# Patient Record
Sex: Female | Born: 1965 | Race: White | Hispanic: No | Marital: Married | Smoking: Former smoker
Health system: Southern US, Community
[De-identification: ages and names within clinical notes are randomized; demographics above are authoritative.]

## PROBLEM LIST (undated history)

## (undated) DIAGNOSIS — O1490 Unspecified pre-eclampsia, unspecified trimester: Secondary | ICD-10-CM

## (undated) DIAGNOSIS — J45909 Unspecified asthma, uncomplicated: Secondary | ICD-10-CM

## (undated) DIAGNOSIS — E1169 Type 2 diabetes mellitus with other specified complication: Secondary | ICD-10-CM

## (undated) DIAGNOSIS — D649 Anemia, unspecified: Secondary | ICD-10-CM

## (undated) DIAGNOSIS — F329 Major depressive disorder, single episode, unspecified: Secondary | ICD-10-CM

## (undated) DIAGNOSIS — I10 Essential (primary) hypertension: Secondary | ICD-10-CM

## (undated) DIAGNOSIS — I749 Embolism and thrombosis of unspecified artery: Secondary | ICD-10-CM

## (undated) DIAGNOSIS — F419 Anxiety disorder, unspecified: Secondary | ICD-10-CM

## (undated) DIAGNOSIS — Z87442 Personal history of urinary calculi: Secondary | ICD-10-CM

## (undated) DIAGNOSIS — G4730 Sleep apnea, unspecified: Secondary | ICD-10-CM

## (undated) DIAGNOSIS — R0600 Dyspnea, unspecified: Secondary | ICD-10-CM

## (undated) DIAGNOSIS — F32A Depression, unspecified: Secondary | ICD-10-CM

## (undated) DIAGNOSIS — E669 Obesity, unspecified: Secondary | ICD-10-CM

## (undated) HISTORY — PX: KIDNEY STONE SURGERY: SHX686

## (undated) HISTORY — DX: Unspecified pre-eclampsia, unspecified trimester: O14.90

---

## 1997-04-13 ENCOUNTER — Encounter (HOSPITAL_COMMUNITY): Admission: RE | Admit: 1997-04-13 | Discharge: 1997-04-26 | Payer: Self-pay | Admitting: *Deleted

## 1997-04-13 ENCOUNTER — Ambulatory Visit (HOSPITAL_COMMUNITY): Admission: RE | Admit: 1997-04-13 | Discharge: 1997-04-13 | Payer: Self-pay | Admitting: *Deleted

## 1997-04-27 ENCOUNTER — Inpatient Hospital Stay (HOSPITAL_COMMUNITY): Admission: AD | Admit: 1997-04-27 | Discharge: 1997-04-30 | Payer: Self-pay | Admitting: Obstetrics & Gynecology

## 1997-05-04 ENCOUNTER — Inpatient Hospital Stay (HOSPITAL_COMMUNITY): Admission: AD | Admit: 1997-05-04 | Discharge: 1997-05-04 | Payer: Self-pay | Admitting: *Deleted

## 1997-06-01 ENCOUNTER — Inpatient Hospital Stay (HOSPITAL_COMMUNITY): Admission: EM | Admit: 1997-06-01 | Discharge: 1997-06-01 | Payer: Self-pay | Admitting: *Deleted

## 1999-01-31 ENCOUNTER — Encounter (HOSPITAL_COMMUNITY): Admission: RE | Admit: 1999-01-31 | Discharge: 1999-05-01 | Payer: Self-pay | Admitting: *Deleted

## 1999-02-16 ENCOUNTER — Other Ambulatory Visit: Admission: RE | Admit: 1999-02-16 | Discharge: 1999-02-16 | Payer: Self-pay | Admitting: General Practice

## 1999-03-10 ENCOUNTER — Other Ambulatory Visit: Admission: RE | Admit: 1999-03-10 | Discharge: 1999-03-10 | Payer: Self-pay | Admitting: General Practice

## 1999-07-04 ENCOUNTER — Other Ambulatory Visit: Admission: RE | Admit: 1999-07-04 | Discharge: 1999-07-04 | Payer: Self-pay | Admitting: Gynecology

## 1999-09-05 ENCOUNTER — Encounter (HOSPITAL_COMMUNITY): Admission: RE | Admit: 1999-09-05 | Discharge: 1999-12-04 | Payer: Self-pay | Admitting: *Deleted

## 1999-10-04 ENCOUNTER — Inpatient Hospital Stay (HOSPITAL_COMMUNITY): Admission: AD | Admit: 1999-10-04 | Discharge: 1999-10-04 | Payer: Self-pay | Admitting: *Deleted

## 1999-11-01 ENCOUNTER — Inpatient Hospital Stay (HOSPITAL_COMMUNITY): Admission: AD | Admit: 1999-11-01 | Discharge: 1999-11-01 | Payer: Self-pay | Admitting: Obstetrics

## 1999-12-12 ENCOUNTER — Encounter: Payer: Self-pay | Admitting: *Deleted

## 1999-12-12 ENCOUNTER — Encounter (HOSPITAL_COMMUNITY): Admission: RE | Admit: 1999-12-12 | Discharge: 2000-03-11 | Payer: Self-pay | Admitting: *Deleted

## 1999-12-19 ENCOUNTER — Encounter: Payer: Self-pay | Admitting: *Deleted

## 2000-01-15 ENCOUNTER — Inpatient Hospital Stay (HOSPITAL_COMMUNITY): Admission: AD | Admit: 2000-01-15 | Discharge: 2000-01-15 | Payer: Self-pay | Admitting: Obstetrics

## 2000-02-16 ENCOUNTER — Encounter: Payer: Self-pay | Admitting: *Deleted

## 2000-03-15 ENCOUNTER — Encounter: Payer: Self-pay | Admitting: *Deleted

## 2000-03-15 ENCOUNTER — Encounter (HOSPITAL_COMMUNITY): Admission: RE | Admit: 2000-03-15 | Discharge: 2000-04-24 | Payer: Self-pay | Admitting: *Deleted

## 2000-04-05 ENCOUNTER — Ambulatory Visit (HOSPITAL_COMMUNITY): Admission: RE | Admit: 2000-04-05 | Discharge: 2000-04-05 | Payer: Self-pay | Admitting: *Deleted

## 2000-04-08 ENCOUNTER — Encounter: Payer: Self-pay | Admitting: Obstetrics & Gynecology

## 2000-04-08 ENCOUNTER — Inpatient Hospital Stay (HOSPITAL_COMMUNITY): Admission: AD | Admit: 2000-04-08 | Discharge: 2000-04-10 | Payer: Self-pay | Admitting: Obstetrics & Gynecology

## 2000-04-12 ENCOUNTER — Encounter: Payer: Self-pay | Admitting: *Deleted

## 2000-04-20 ENCOUNTER — Inpatient Hospital Stay (HOSPITAL_COMMUNITY): Admission: AD | Admit: 2000-04-20 | Discharge: 2000-04-20 | Payer: Self-pay | Admitting: *Deleted

## 2000-04-22 ENCOUNTER — Inpatient Hospital Stay (HOSPITAL_COMMUNITY): Admission: AD | Admit: 2000-04-22 | Discharge: 2000-04-22 | Payer: Self-pay | Admitting: Obstetrics

## 2000-04-23 ENCOUNTER — Inpatient Hospital Stay (HOSPITAL_COMMUNITY): Admission: AD | Admit: 2000-04-23 | Discharge: 2000-04-25 | Payer: Self-pay | Admitting: *Deleted

## 2000-04-28 ENCOUNTER — Inpatient Hospital Stay (HOSPITAL_COMMUNITY): Admission: AD | Admit: 2000-04-28 | Discharge: 2000-04-28 | Payer: Self-pay | Admitting: Obstetrics & Gynecology

## 2000-05-09 ENCOUNTER — Ambulatory Visit (HOSPITAL_COMMUNITY): Admission: RE | Admit: 2000-05-09 | Discharge: 2000-05-09 | Payer: Self-pay | Admitting: Oncology

## 2000-05-09 ENCOUNTER — Encounter: Payer: Self-pay | Admitting: Oncology

## 2000-06-03 ENCOUNTER — Inpatient Hospital Stay (HOSPITAL_COMMUNITY): Admission: AD | Admit: 2000-06-03 | Discharge: 2000-06-03 | Payer: Self-pay | Admitting: Obstetrics & Gynecology

## 2000-07-04 ENCOUNTER — Emergency Department (HOSPITAL_COMMUNITY): Admission: EM | Admit: 2000-07-04 | Discharge: 2000-07-04 | Payer: Self-pay | Admitting: Emergency Medicine

## 2001-10-21 ENCOUNTER — Encounter (HOSPITAL_COMMUNITY): Admission: RE | Admit: 2001-10-21 | Discharge: 2001-11-20 | Payer: Self-pay | Admitting: *Deleted

## 2001-11-04 ENCOUNTER — Encounter: Payer: Self-pay | Admitting: *Deleted

## 2001-11-23 ENCOUNTER — Observation Stay (HOSPITAL_COMMUNITY): Admission: AD | Admit: 2001-11-23 | Discharge: 2001-11-24 | Payer: Self-pay | Admitting: *Deleted

## 2001-11-26 ENCOUNTER — Inpatient Hospital Stay (HOSPITAL_COMMUNITY): Admission: AD | Admit: 2001-11-26 | Discharge: 2001-11-27 | Payer: Self-pay | Admitting: *Deleted

## 2001-12-02 ENCOUNTER — Encounter (HOSPITAL_COMMUNITY): Admission: RE | Admit: 2001-12-02 | Discharge: 2002-01-01 | Payer: Self-pay | Admitting: *Deleted

## 2002-01-06 ENCOUNTER — Encounter (HOSPITAL_COMMUNITY): Admission: RE | Admit: 2002-01-06 | Discharge: 2002-02-05 | Payer: Self-pay | Admitting: *Deleted

## 2002-01-06 ENCOUNTER — Encounter: Payer: Self-pay | Admitting: *Deleted

## 2002-01-12 ENCOUNTER — Inpatient Hospital Stay (HOSPITAL_COMMUNITY): Admission: AD | Admit: 2002-01-12 | Discharge: 2002-01-12 | Payer: Self-pay | Admitting: *Deleted

## 2002-01-16 ENCOUNTER — Inpatient Hospital Stay (HOSPITAL_COMMUNITY): Admission: AD | Admit: 2002-01-16 | Discharge: 2002-01-16 | Payer: Self-pay | Admitting: *Deleted

## 2002-02-10 ENCOUNTER — Encounter (HOSPITAL_COMMUNITY): Admission: RE | Admit: 2002-02-10 | Discharge: 2002-03-12 | Payer: Self-pay | Admitting: *Deleted

## 2002-03-17 ENCOUNTER — Encounter (HOSPITAL_COMMUNITY): Admission: RE | Admit: 2002-03-17 | Discharge: 2002-03-17 | Payer: Self-pay | Admitting: *Deleted

## 2002-03-17 ENCOUNTER — Encounter: Payer: Self-pay | Admitting: *Deleted

## 2002-03-30 ENCOUNTER — Inpatient Hospital Stay (HOSPITAL_COMMUNITY): Admission: AD | Admit: 2002-03-30 | Discharge: 2002-03-30 | Payer: Self-pay | Admitting: *Deleted

## 2002-03-31 ENCOUNTER — Encounter (HOSPITAL_COMMUNITY): Admission: RE | Admit: 2002-03-31 | Discharge: 2002-04-30 | Payer: Self-pay | Admitting: *Deleted

## 2002-04-08 ENCOUNTER — Encounter: Payer: Self-pay | Admitting: *Deleted

## 2002-04-08 ENCOUNTER — Encounter: Admission: RE | Admit: 2002-04-08 | Discharge: 2002-04-08 | Payer: Self-pay | Admitting: *Deleted

## 2002-04-08 ENCOUNTER — Observation Stay (HOSPITAL_COMMUNITY): Admission: AD | Admit: 2002-04-08 | Discharge: 2002-04-09 | Payer: Self-pay | Admitting: Obstetrics and Gynecology

## 2002-04-16 ENCOUNTER — Encounter: Admission: RE | Admit: 2002-04-16 | Discharge: 2002-04-16 | Payer: Self-pay | Admitting: *Deleted

## 2002-04-23 ENCOUNTER — Encounter: Admission: RE | Admit: 2002-04-23 | Discharge: 2002-04-23 | Payer: Self-pay | Admitting: *Deleted

## 2002-04-30 ENCOUNTER — Encounter: Admission: RE | Admit: 2002-04-30 | Discharge: 2002-04-30 | Payer: Self-pay | Admitting: *Deleted

## 2002-04-30 ENCOUNTER — Ambulatory Visit (HOSPITAL_COMMUNITY): Admission: RE | Admit: 2002-04-30 | Discharge: 2002-04-30 | Payer: Self-pay | Admitting: *Deleted

## 2002-05-07 ENCOUNTER — Encounter (HOSPITAL_COMMUNITY): Admission: RE | Admit: 2002-05-07 | Discharge: 2002-05-31 | Payer: Self-pay | Admitting: *Deleted

## 2002-05-07 ENCOUNTER — Encounter: Admission: RE | Admit: 2002-05-07 | Discharge: 2002-05-07 | Payer: Self-pay | Admitting: *Deleted

## 2002-05-11 ENCOUNTER — Encounter: Admission: RE | Admit: 2002-05-11 | Discharge: 2002-05-11 | Payer: Self-pay | Admitting: *Deleted

## 2002-05-14 ENCOUNTER — Encounter: Admission: RE | Admit: 2002-05-14 | Discharge: 2002-05-14 | Payer: Self-pay | Admitting: *Deleted

## 2002-05-15 ENCOUNTER — Inpatient Hospital Stay (HOSPITAL_COMMUNITY): Admission: AD | Admit: 2002-05-15 | Discharge: 2002-05-15 | Payer: Self-pay | Admitting: Family Medicine

## 2002-06-01 ENCOUNTER — Inpatient Hospital Stay (HOSPITAL_COMMUNITY): Admission: AD | Admit: 2002-06-01 | Discharge: 2002-06-04 | Payer: Self-pay | Admitting: *Deleted

## 2002-06-12 ENCOUNTER — Encounter: Admission: RE | Admit: 2002-06-12 | Discharge: 2002-06-12 | Payer: Self-pay | Admitting: Family Medicine

## 2002-06-25 ENCOUNTER — Encounter: Admission: RE | Admit: 2002-06-25 | Discharge: 2002-06-25 | Payer: Self-pay | Admitting: Family Medicine

## 2002-07-14 ENCOUNTER — Other Ambulatory Visit: Admission: RE | Admit: 2002-07-14 | Discharge: 2002-07-14 | Payer: Self-pay | Admitting: *Deleted

## 2002-07-14 ENCOUNTER — Encounter: Admission: RE | Admit: 2002-07-14 | Discharge: 2002-07-14 | Payer: Self-pay | Admitting: *Deleted

## 2002-07-27 ENCOUNTER — Encounter: Admission: RE | Admit: 2002-07-27 | Discharge: 2002-07-27 | Payer: Self-pay | Admitting: *Deleted

## 2004-02-27 ENCOUNTER — Ambulatory Visit: Payer: Self-pay | Admitting: Psychiatry

## 2004-02-27 ENCOUNTER — Inpatient Hospital Stay (HOSPITAL_COMMUNITY): Admission: EM | Admit: 2004-02-27 | Discharge: 2004-03-03 | Payer: Self-pay | Admitting: Psychiatry

## 2004-03-07 ENCOUNTER — Other Ambulatory Visit (HOSPITAL_COMMUNITY): Admission: RE | Admit: 2004-03-07 | Discharge: 2004-06-05 | Payer: Self-pay | Admitting: Psychiatry

## 2011-04-22 ENCOUNTER — Encounter: Payer: Self-pay | Admitting: Family Medicine

## 2011-04-22 ENCOUNTER — Ambulatory Visit (INDEPENDENT_AMBULATORY_CARE_PROVIDER_SITE_OTHER): Payer: BC Managed Care – PPO | Admitting: Family Medicine

## 2011-04-22 VITALS — BP 123/87 | HR 116 | Temp 98.3°F | Resp 18 | Ht 68.0 in | Wt 217.0 lb

## 2011-04-22 DIAGNOSIS — J019 Acute sinusitis, unspecified: Secondary | ICD-10-CM

## 2011-04-22 DIAGNOSIS — J329 Chronic sinusitis, unspecified: Secondary | ICD-10-CM

## 2011-04-22 MED ORDER — AMOXICILLIN-POT CLAVULANATE 875-125 MG PO TABS: 1.0000 | ORAL_TABLET | Freq: Two times a day (BID) | ORAL | Status: AC

## 2011-04-22 NOTE — Patient Instructions (Signed)

## 2011-04-22 NOTE — Progress Notes (Signed)
This is a 46 year old mother of 3 who developed acute sinus pressure, chills, sweats, and nasal congestion in the last 2 days.  She has a history of sinus infections they typically start with this congestion and progressed to pulmonary symptoms. She had a history of pneumonia in the past.  Review of systems is negative for sore throat cough fever nausea urinary symptoms or rash.  Objective: Middle-age woman in no acute distress, appears to be in good spirits  HEENT: Negative with the exception of marked nasal congestion and mucopurulent discharge.  Neck: No adenopathy, supple, no thyromegaly.  Chest: No respiratory distress.  Assessment: Uncomplicated acute sinusitis  Plan: Continue the Flonase, and Augmentin 875 twice a day x10 days.

## 2011-06-19 ENCOUNTER — Ambulatory Visit (INDEPENDENT_AMBULATORY_CARE_PROVIDER_SITE_OTHER): Payer: BC Managed Care – PPO | Admitting: Family Medicine

## 2011-06-19 VITALS — BP 128/87 | HR 94 | Temp 98.3°F | Resp 18 | Ht 68.5 in | Wt 216.0 lb

## 2011-06-19 DIAGNOSIS — B9789 Other viral agents as the cause of diseases classified elsewhere: Secondary | ICD-10-CM

## 2011-06-19 DIAGNOSIS — R197 Diarrhea, unspecified: Secondary | ICD-10-CM

## 2011-06-19 DIAGNOSIS — R110 Nausea: Secondary | ICD-10-CM

## 2011-06-19 DIAGNOSIS — B349 Viral infection, unspecified: Secondary | ICD-10-CM

## 2011-06-19 DIAGNOSIS — R109 Unspecified abdominal pain: Secondary | ICD-10-CM

## 2011-06-19 LAB — POCT CBC
Granulocyte percent: 43.2 %G (ref 37–80)
HCT, POC: 38.3 % (ref 37.7–47.9)
Hemoglobin: 12.7 g/dL (ref 12.2–16.2)
Lymph, poc: 3 (ref 0.6–3.4)
MCH, POC: 29.8 pg (ref 27–31.2)
MCHC: 33.2 g/dL (ref 31.8–35.4)
MCV: 89.9 fL (ref 80–97)
MID (cbc): 0.7 (ref 0–0.9)
MPV: 9.2 fL (ref 0–99.8)
POC Granulocyte: 2.9 (ref 2–6.9)
POC LYMPH PERCENT: 45.8 %L (ref 10–50)
POC MID %: 11 %M (ref 0–12)
Platelet Count, POC: 278 10*3/uL (ref 142–424)
RBC: 4.26 M/uL (ref 4.04–5.48)
RDW, POC: 15.2 %
WBC: 6.6 10*3/uL (ref 4.6–10.2)

## 2011-06-19 LAB — POCT URINALYSIS DIPSTICK
Glucose, UA: NEGATIVE
Ketones, UA: 15
Leukocytes, UA: NEGATIVE
Nitrite, UA: NEGATIVE
Protein, UA: 30
Spec Grav, UA: >=1.03
Urobilinogen, UA: 0.2
pH, UA: 6

## 2011-06-19 LAB — POCT UA - MICROSCOPIC ONLY
Casts, Ur, LPF, POC: NEGATIVE
Yeast, UA: NEGATIVE

## 2011-06-19 MED ORDER — DICYCLOMINE HCL 10 MG PO CAPS: 10.0000 mg | ORAL_CAPSULE | Freq: Three times a day (TID) | ORAL | Status: DC

## 2011-06-19 NOTE — Patient Instructions (Signed)
Fluids.  Return if not improving in 3 days

## 2011-06-19 NOTE — Progress Notes (Signed)
Subjective: Patient has had about 8 days of a pill is. She went to [facility] to see [facility] game developed abdominal pains and diarrhea. He went to an urgent care and got some Lomotil and Zofran. She has persisted in having diarrhea and abdominal discomfort all week. It is a generalized at times crampy discomfort. No major bloating. She does not eat much, but has been drinking a fair amount. She has decreased urination. 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 No one else at home has been ill. She has not been drinking alcohol. Does not know of any trigger that caused the illness. Has never had any surgical abdominal treatment. Her menstrual period has been on for a few days. She is on several psychotropic medications, one of which was just changed a couple of weeks ago. She is only been on the Cymbalta for less than 2 weeks. She is now on 60 mg a day. It has helped her depression considerably. Never had any gallbladder issues. She is not on contraceptives, but her husband has had his vasectomy.  Objective: Overweight white female no acute distress this time. TMs are normal. Throat clear. Neck supple without nodes. Chest clear to auscultation. Heart regular without murmurs. Soft and mass tenderness. Bowel sounds were present. Skin warm and dry.  Assessment: Diarrhea Abdominal pain Depression  Plan: Check CBC, CMET., and uA  Results for orders placed in visit on 06/19/11  POCT CBC      Component Value Range   WBC 6.6  4.6 - 10.2 (K/uL)   Lymph, poc 3.0  0.6 - 3.4    POC LYMPH PERCENT 45.8  10 - 50 (%L)   MID (cbc) 0.7  0 - 0.9    POC MID % 11.0  0 - 12 (%M)   POC Granulocyte 2.9  2 - 6.9    Granulocyte percent 43.2  37 - 80 (%G)   RBC 4.26  4.04 - 5.48 (M/uL)   Hemoglobin 12.7  12.2 - 16.2 (g/dL)   HCT, POC 16.1  09.6 - 47.9 (%)   MCV 89.9  80 - 97 (fL)   MCH, POC 29.8  27 - 31.2 (pg)   MCHC 33.2  31.8 - 35.4 (g/dL)   RDW, POC 04.5     Platelet Count, POC 278  142 - 424  (K/uL)   MPV 9.2  0 - 99.8 (fL)  POCT UA - MICROSCOPIC ONLY      Component Value Range   WBC, Ur, HPF, POC 1-3     RBC, urine, microscopic 8-12     Bacteria, U Microscopic sm     Mucus, UA sm     Epithelial cells, urine per micros 2-4     Crystals, Ur, HPF, POC calcium oxalate     Casts, Ur, LPF, POC neg     Yeast, UA neg    POCT URINALYSIS DIPSTICK      Component Value Range   Color, UA yellow     Clarity, UA hazy     Glucose, UA neg     Bilirubin, UA small     Ketones, UA 15     Spec Grav, UA >=1.030     Blood, UA large     pH, UA 6.0     Protein, UA 30     Urobilinogen, UA 0.2     Nitrite, UA neg     Leukocytes, UA Negative     Will try some Bentyl and the patient.  If she is not doing better by Thursday she is to contact me and we will order a gallbladder ultrasound. Blood chemistries should be back by then.

## 2011-06-20 ENCOUNTER — Telehealth: Payer: Self-pay | Admitting: Family Medicine

## 2011-06-20 LAB — COMPREHENSIVE METABOLIC PANEL
ALT: 21 U/L (ref 0–35)
AST: 13 U/L (ref 0–37)
Albumin: 4.4 g/dL (ref 3.5–5.2)
Alkaline Phosphatase: 59 U/L (ref 39–117)
BUN: 11 mg/dL (ref 6–23)
CO2: 29 mEq/L (ref 19–32)
Calcium: 9.1 mg/dL (ref 8.4–10.5)
Chloride: 103 mEq/L (ref 96–112)
Creat: 0.58 mg/dL (ref 0.50–1.10)
Glucose, Bld: 90 mg/dL (ref 70–99)
Potassium: 3.1 mEq/L — ABNORMAL LOW (ref 3.5–5.3)
Sodium: 142 mEq/L (ref 135–145)
Total Bilirubin: 0.3 mg/dL (ref 0.3–1.2)
Total Protein: 6.2 g/dL (ref 6.0–8.3)

## 2011-06-20 NOTE — Telephone Encounter (Signed)
Called patient and left message on machine. Her potassium level is a little bit low from the persistent gastroenteritis. She is to call back if she is not doing better. If she is really feeling bad she is coming here for recheck probably could just put her on a little potassium, 20 mEq twice a day for 5 days.

## 2011-07-17 ENCOUNTER — Ambulatory Visit (INDEPENDENT_AMBULATORY_CARE_PROVIDER_SITE_OTHER): Payer: BC Managed Care – PPO | Admitting: Internal Medicine

## 2011-07-17 VITALS — BP 128/87 | HR 108 | Temp 98.7°F | Resp 18 | Ht 68.5 in | Wt 224.0 lb

## 2011-07-17 DIAGNOSIS — N390 Urinary tract infection, site not specified: Secondary | ICD-10-CM

## 2011-07-17 DIAGNOSIS — R300 Dysuria: Secondary | ICD-10-CM

## 2011-07-17 LAB — POCT URINALYSIS DIPSTICK
Bilirubin, UA: NEGATIVE
Glucose, UA: 100
Ketones, UA: NEGATIVE
Leukocytes, UA: NEGATIVE
Nitrite, UA: POSITIVE
Protein, UA: 30
Spec Grav, UA: >=1.03
Urobilinogen, UA: 1
pH, UA: 5.5

## 2011-07-17 LAB — POCT UA - MICROSCOPIC ONLY
Bacteria, U Microscopic: NEGATIVE
Casts, Ur, LPF, POC: NEGATIVE
Crystals, Ur, HPF, POC: NEGATIVE
Mucus, UA: NEGATIVE
Yeast, UA: NEGATIVE

## 2011-07-17 MED ORDER — CIPROFLOXACIN HCL 500 MG PO TABS: 500.0000 mg | ORAL_TABLET | Freq: Two times a day (BID) | ORAL | Status: DC

## 2011-07-17 MED ORDER — PHENAZOPYRIDINE HCL 200 MG PO TABS: 200.0000 mg | ORAL_TABLET | Freq: Three times a day (TID) | ORAL | Status: AC | PRN

## 2011-07-17 NOTE — Progress Notes (Signed)
  Subjective:    Patient ID: Deborah Soto, female    DOB: [birth year], 46 y.o.   MRN: [id number]  HPIAbrupt onset of dysuria frequency and urgency last night. Dysuria not responding to over-the-counter medications so she was unable to sleep No fever, sweats, abdominal pain, or back pain Last UTI several years ago    Review of Systems     Objective:   Physical Exam Negative CVA tenderness Negative abdominal tenderness       Results for orders placed in visit on 07/17/11  POCT UA - MICROSCOPIC ONLY      Component Value Range   WBC, Ur, HPF, POC 6-8     RBC, urine, microscopic TNTC     Bacteria, U Microscopic negative     Mucus, UA negative     Epithelial cells, urine per micros 0-2     Crystals, Ur, HPF, POC negative     Casts, Ur, LPF, POC negative     Yeast, UA negative    POCT URINALYSIS DIPSTICK      Component Value Range   Color, UA orange     Clarity, UA cloudy     Glucose, UA 100     Bilirubin, UA neg     Ketones, UA neg     Spec Grav, UA >=1.030     Blood, UA moderate     pH, UA 5.5     Protein, UA 30     Urobilinogen, UA 1.0     Nitrite, UA positive     Leukocytes, UA Negative      Assessment & Plan:  Problem #1 urinary tract infection Cipro 500 twice a day for 5 days Pyridium 200 3 times a day as needed #10 Culture Followup if not responding

## 2011-07-20 LAB — URINE CULTURE: Colony Count: >=100000

## 2011-07-23 ENCOUNTER — Encounter: Payer: Self-pay | Admitting: Internal Medicine

## 2011-07-25 ENCOUNTER — Other Ambulatory Visit: Payer: Self-pay | Admitting: Internal Medicine

## 2011-07-25 DIAGNOSIS — Z1231 Encounter for screening mammogram for malignant neoplasm of breast: Secondary | ICD-10-CM

## 2011-08-09 ENCOUNTER — Ambulatory Visit
Admission: RE | Admit: 2011-08-09 | Discharge: 2011-08-09 | Disposition: A | Discharge status: Home / Self Care | Payer: BC Managed Care – PPO | Source: Ambulatory Visit | Attending: Internal Medicine | Admitting: Internal Medicine

## 2011-08-09 DIAGNOSIS — Z1231 Encounter for screening mammogram for malignant neoplasm of breast: Secondary | ICD-10-CM

## 2011-08-27 ENCOUNTER — Emergency Department (HOSPITAL_COMMUNITY)
Admission: EM | Admit: 2011-08-27 | Discharge: 2011-08-27 | Disposition: A | Discharge status: Home / Self Care | Payer: BC Managed Care – PPO | Attending: Emergency Medicine | Admitting: Emergency Medicine

## 2011-08-27 ENCOUNTER — Emergency Department (HOSPITAL_COMMUNITY): Payer: BC Managed Care – PPO

## 2011-08-27 ENCOUNTER — Encounter (HOSPITAL_COMMUNITY): Payer: Self-pay | Admitting: Emergency Medicine

## 2011-08-27 DIAGNOSIS — R079 Chest pain, unspecified: Secondary | ICD-10-CM | POA: Insufficient documentation

## 2011-08-27 DIAGNOSIS — Z86718 Personal history of other venous thrombosis and embolism: Secondary | ICD-10-CM | POA: Insufficient documentation

## 2011-08-27 DIAGNOSIS — F3289 Other specified depressive episodes: Secondary | ICD-10-CM | POA: Insufficient documentation

## 2011-08-27 DIAGNOSIS — Z79899 Other long term (current) drug therapy: Secondary | ICD-10-CM | POA: Insufficient documentation

## 2011-08-27 DIAGNOSIS — Z86711 Personal history of pulmonary embolism: Secondary | ICD-10-CM | POA: Insufficient documentation

## 2011-08-27 DIAGNOSIS — F329 Major depressive disorder, single episode, unspecified: Secondary | ICD-10-CM | POA: Insufficient documentation

## 2011-08-27 DIAGNOSIS — Z87891 Personal history of nicotine dependence: Secondary | ICD-10-CM | POA: Insufficient documentation

## 2011-08-27 HISTORY — DX: Major depressive disorder, single episode, unspecified: F32.9

## 2011-08-27 HISTORY — DX: Depression, unspecified: F32.A

## 2011-08-27 HISTORY — DX: Embolism and thrombosis of unspecified artery: I74.9

## 2011-08-27 LAB — BASIC METABOLIC PANEL
BUN: 11 mg/dL (ref 6–23)
CO2: 25 mEq/L (ref 19–32)
Calcium: 9.4 mg/dL (ref 8.4–10.5)
Chloride: 102 mEq/L (ref 96–112)
Creatinine, Ser: 0.64 mg/dL (ref 0.50–1.10)
GFR calc Af Amer: >90 mL/min (ref >90)
GFR calc non Af Amer: >90 mL/min (ref >90)
Glucose, Bld: 124 mg/dL — ABNORMAL HIGH (ref 70–99)
Potassium: 3.8 mEq/L (ref 3.5–5.1)
Sodium: 137 mEq/L (ref 135–145)

## 2011-08-27 LAB — DIFFERENTIAL
Basophils Absolute: 0 10*3/uL (ref 0.0–0.1)
Basophils Relative: 0 % (ref 0–1)
Eosinophils Absolute: 0.2 10*3/uL (ref 0.0–0.7)
Eosinophils Relative: 2 % (ref 0–5)
Lymphocytes Relative: 23 % (ref 12–46)
Lymphs Abs: 2.4 10*3/uL (ref 0.7–4.0)
Monocytes Absolute: 1.1 10*3/uL — ABNORMAL HIGH (ref 0.1–1.0)
Monocytes Relative: 11 % (ref 3–12)
Neutro Abs: 6.8 10*3/uL (ref 1.7–7.7)
Neutrophils Relative %: 65 % (ref 43–77)

## 2011-08-27 LAB — CBC
HCT: 40.7 % (ref 36.0–46.0)
Hemoglobin: 13.7 g/dL (ref 12.0–15.0)
MCH: 30.4 pg (ref 26.0–34.0)
MCHC: 33.7 g/dL (ref 30.0–36.0)
MCV: 90.4 fL (ref 78.0–100.0)
Platelets: 279 K/uL (ref 150–400)
RBC: 4.5 MIL/uL (ref 3.87–5.11)
RDW: 13.5 % (ref 11.5–15.5)
WBC: 10.8 K/uL — ABNORMAL HIGH (ref 4.0–10.5)

## 2011-08-27 LAB — COMPREHENSIVE METABOLIC PANEL WITH GFR
ALT: 11 U/L (ref 0–35)
AST: 9 U/L (ref 0–37)
Albumin: 3.9 g/dL (ref 3.5–5.2)
Alkaline Phosphatase: 53 U/L (ref 39–117)
BUN: 11 mg/dL (ref 6–23)
CO2: 25 meq/L (ref 19–32)
Calcium: 9.6 mg/dL (ref 8.4–10.5)
Chloride: 101 meq/L (ref 96–112)
Creatinine, Ser: 0.56 mg/dL (ref 0.50–1.10)
GFR calc Af Amer: >90 mL/min (ref >90)
GFR calc non Af Amer: >90 mL/min (ref >90)
Glucose, Bld: 115 mg/dL — ABNORMAL HIGH (ref 70–99)
Potassium: 4 meq/L (ref 3.5–5.1)
Sodium: 136 meq/L (ref 135–145)
Total Bilirubin: 0.4 mg/dL (ref 0.3–1.2)
Total Protein: 6.5 g/dL (ref 6.0–8.3)

## 2011-08-27 LAB — POCT I-STAT TROPONIN I: Troponin i, poc: 0 ng/mL (ref 0.00–0.08)

## 2011-08-27 LAB — D-DIMER, QUANTITATIVE: D-Dimer, Quant: 0.35 ug{FEU}/mL (ref 0.00–0.48)

## 2011-08-27 LAB — LIPASE, BLOOD: Lipase: 29 U/L (ref 11–59)

## 2011-08-27 MED ORDER — ONDANSETRON HCL 4 MG/2ML IJ SOLN
4.0000 mg | Freq: Once | INTRAMUSCULAR | Status: AC
  Administered 2011-08-27: 4 mg via INTRAVENOUS
  Filled 2011-08-27: qty 2

## 2011-08-27 MED ORDER — ASPIRIN 325 MG PO TABS
325.0000 mg | ORAL_TABLET | ORAL | Status: AC
  Administered 2011-08-27: 325 mg via ORAL

## 2011-08-27 MED ORDER — PANTOPRAZOLE SODIUM 20 MG PO TBEC: 20.0000 mg | DELAYED_RELEASE_TABLET | Freq: Every day | ORAL | Status: DC

## 2011-08-27 MED ORDER — PANTOPRAZOLE SODIUM 40 MG IV SOLR
40.0000 mg | Freq: Once | INTRAVENOUS | Status: AC
  Administered 2011-08-27: 40 mg via INTRAVENOUS
  Filled 2011-08-27: qty 40

## 2011-08-27 MED ORDER — MORPHINE SULFATE 4 MG/ML IJ SOLN
4.0000 mg | Freq: Once | INTRAMUSCULAR | Status: DC
  Filled 2011-08-27: qty 1

## 2011-08-27 MED ORDER — ASPIRIN 325 MG PO TABS
ORAL_TABLET | ORAL | Status: AC
  Filled 2011-08-27: qty 1

## 2011-08-27 MED ORDER — GI COCKTAIL ~~LOC~~
30.0000 mL | Freq: Once | ORAL | Status: AC
  Administered 2011-08-27: 30 mL via ORAL
  Filled 2011-08-27: qty 30

## 2011-08-27 MED ORDER — NITROGLYCERIN 0.4 MG SL SUBL: 0.4000 mg | SUBLINGUAL_TABLET | SUBLINGUAL | Status: DC | PRN

## 2011-08-27 NOTE — ED Provider Notes (Signed)
History     CSN: [id number]  Arrival date & time 08/27/11  7829   First MD Initiated Contact with Patient 08/27/11 0703      Chief Complaint  Patient presents with  . Chest Pain    (Consider location/radiation/quality/duration/timing/severity/associated sxs/prior treatment) Patient is a 46 y.o. female presenting with chest pain. The history is provided by the patient.  Chest Pain Pertinent negatives for primary symptoms include no fever, no shortness of breath, no cough, no palpitations, no abdominal pain, no nausea and no vomiting.    the patient is a 46 year old, morbidly obese, female, with a remote history of pulmonary embolism, who presents to emergency department complaining of right-sided chest pain, which woke her up.  This morning.  She states that the pain is constant and radiates to her back.  The pain does not increase with respirations.  She denies nausea, vomiting, fevers, chills, cough.  She denies leg pain or swelling.  She denies recent travel or surgery.  She occasionally drinks alcohol.  She denies a history of peptic ulcer disease, or reflux.  She denies a history of abdominal surgery.  In the past.  She has a family history of coronary artery disease.  Her father had multiple heart attacks, and strokes, the earliest being when he was in his 68s  Past Medical History  Diagnosis Date  . Depression   . Embolism - blood clot     legs & lung    History reviewed. No pertinent past surgical history.  No family history on file.  History  Substance Use Topics  . Smoking status: Former Smoker -- 1.0 packs/day for 25 years    Quit date: 03/12/2004  . Smokeless tobacco: Not on file  . Alcohol Use: No    OB History    Grav Para Term Preterm Abortions TAB SAB Ect Mult Living                  Review of Systems  Constitutional: Negative for fever and chills.  HENT: Negative for congestion.   Respiratory: Negative for cough, chest tightness and shortness of  breath.   Cardiovascular: Positive for chest pain. Negative for palpitations and leg swelling.  Gastrointestinal: Negative for nausea, vomiting, abdominal pain and constipation.  Genitourinary: Negative for dysuria.  Musculoskeletal: Negative for back pain.  Neurological: Negative for headaches.  Psychiatric/Behavioral: Negative for confusion.  All other systems reviewed and are negative.    Allergies  Review of patient's allergies indicates no known allergies.  Home Medications   Current Outpatient Rx  Name Route Sig Dispense Refill  . DULOXETINE HCL 60 MG PO CPEP Oral Take 60 mg by mouth daily.    Marland Kitchen FLUTICASONE PROPIONATE 50 MCG/ACT NA SUSP Nasal Place 2 sprays into the nose daily.    . IBUPROFEN 200 MG PO TABS Oral Take 400 mg by mouth every 6 (six) hours as needed. Pain    . LAMOTRIGINE 150 MG PO TABS Oral Take 300 mg by mouth daily.     Marland Kitchen LISDEXAMFETAMINE DIMESYLATE 20 MG PO CAPS Oral Take 20 mg by mouth every morning.    Marland Kitchen LURASIDONE HCL 40 MG PO TABS Oral Take by mouth daily with breakfast.      BP 123/86  Pulse 85  Temp 98.8 F (37.1 C) (Oral)  Resp 17  Ht 5\' 8"  (1.727 m)  Wt 220 lb (99.791 kg)  BMI 33.45 kg/m2  SpO2 100%  LMP 08/06/2011  Physical Exam  Nursing note and vitals  reviewed. Constitutional: She is oriented to person, place, and time. No distress.       Morbidly obese  HENT:  Head: Normocephalic and atraumatic.  Eyes: Conjunctivae are normal.  Neck: Normal range of motion. Neck supple.  Cardiovascular: Normal rate.   No murmur heard. Pulmonary/Chest: Effort normal and breath sounds normal. No respiratory distress. She has no rales.  Abdominal: Soft. She exhibits no distension. There is no tenderness.  Musculoskeletal: Normal range of motion. She exhibits no edema and no tenderness.  Neurological: She is alert and oriented to person, place, and time. No cranial nerve deficit.  Skin: Skin is warm and dry.  Psychiatric: She has a normal mood and  affect. Thought content normal.    ED Course  Procedures (including critical care time) 46 year old, morbidly obese, female, presents emergency department with right-sided chest pain, radiating to her back.  Her physical examination is normal.  The pain is constant and is associated with no other symptoms.  Given her age and her presentation.  Most likely this is peptic ulcer disease, biliary tract related to or pancreatitis.  Acute coronary syndrome is very unlikely.  I doubt that she has a respiratory etiology for her symptoms and I doubt that she has an aortic dissection, or aneurysm.  There is nothing to suggest, that her symptoms are attributable to the renal system.   Labs Reviewed  CBC  BASIC METABOLIC PANEL  DIFFERENTIAL  COMPREHENSIVE METABOLIC PANEL  LIPASE, BLOOD  D-DIMER, QUANTITATIVE   No results found.   No diagnosis found.   Date: 08/27/2011  Rate: 82  Rhythm: normal sinus rhythm  QRS Axis: normal  Intervals: normal  ST/T Wave abnormalities: normal  Conduction Disutrbances: none  Narrative Interpretation: unremarkable   8:53 AM sxs resolved in ed after tx, WITHOUT the morphine I had ordered  MDM  Chest pain - resolved in ed after gi cocktail and ppi  most likely pud. No evidence pe, ami/pulm dz        Cheri Guppy, MD 08/27/11 [phone]

## 2011-08-27 NOTE — ED Notes (Signed)
Wants to wait on morphine injection to see how other medication works. Resting comfortably at present.

## 2011-08-27 NOTE — ED Notes (Signed)
Pt states that she awoke with tightness and cramping in her mid chest just below the sternum that radiates to her back. Pt states that she took 400 mg Ibuprofen and 2 tums to no avail. Pt denies any trauma or injury. Pt denies vomiting and SOB. Pt is in obvious distress.

## 2011-08-27 NOTE — Discharge Instructions (Signed)
Your EKG, and blood tests do not show any signs of a heart attack or blood clot in your lungs.  Use Protonix to reduce acid in her stomach to control your pain.  Followup with your Dr. for reevaluation.  Return for worse or uncontrolled symptoms

## 2011-09-05 ENCOUNTER — Ambulatory Visit: Payer: BC Managed Care – PPO

## 2011-09-05 ENCOUNTER — Telehealth: Payer: Self-pay

## 2011-09-05 ENCOUNTER — Ambulatory Visit (INDEPENDENT_AMBULATORY_CARE_PROVIDER_SITE_OTHER): Payer: BC Managed Care – PPO | Admitting: Emergency Medicine

## 2011-09-05 VITALS — BP 115/80 | HR 96 | Temp 99.1°F | Resp 18

## 2011-09-05 DIAGNOSIS — M25572 Pain in left ankle and joints of left foot: Secondary | ICD-10-CM

## 2011-09-05 DIAGNOSIS — M79672 Pain in left foot: Secondary | ICD-10-CM

## 2011-09-05 DIAGNOSIS — S93409A Sprain of unspecified ligament of unspecified ankle, initial encounter: Secondary | ICD-10-CM

## 2011-09-05 MED ORDER — HYDROCODONE-ACETAMINOPHEN 5-500 MG PO TABS: 1.0000 | ORAL_TABLET | ORAL | Status: AC | PRN

## 2011-09-05 NOTE — Telephone Encounter (Signed)
By the time it is approved ankle pain will be resolved. I can write a note allowing her to park closer to her building if that will help.

## 2011-09-05 NOTE — Telephone Encounter (Signed)
Don't think this is necessary... pls advise.

## 2011-09-05 NOTE — Telephone Encounter (Signed)
PT WAS SEEN EARLIER TODAY FOR AN ANKLE SPRAIN, PT WOULD LIKE TO KNOW IF IT WAS POSSIBLE FOR HER TO GET A HANDICAP PLACARD SINCE HER ANKLE IS SPRAINED.

## 2011-09-05 NOTE — Progress Notes (Signed)
     Patient Name: Deborah Soto Date of Birth: [birth year] Medical Record Number: [id number] Gender: female Date of Encounter: 09/05/2011  History of Present Illness:  Deborah Soto is a 46 y.o. very pleasant female patient who presents with the following:  Larey Seat down a flight of stairs yesterday and has pain, bruising and swelling lateral ankle and foot.  Able to painfully bear weight.  Denies other injury  There is no problem list on file for this patient.  Past Medical History  Diagnosis Date  . Depression   . Embolism - blood clot     legs & lung   No past surgical history on file. History  Substance Use Topics  . Smoking status: Former Smoker -- 1.0 packs/day for 25 years    Quit date: 03/12/2004  . Smokeless tobacco: Not on file  . Alcohol Use: No   No family history on file. No Known Allergies  Medication list has been reviewed and updated.  Prior to Admission medications   Medication Sig Start Date End Date Taking? Authorizing Provider  DULoxetine (CYMBALTA) 60 MG capsule Take 60 mg by mouth daily.   Yes Historical Provider, MD  ibuprofen (ADVIL,MOTRIN) 200 MG tablet Take 400 mg by mouth every 6 (six) hours as needed. Pain   Yes Historical Provider, MD  lamoTRIgine (LAMICTAL) 150 MG tablet Take 300 mg by mouth daily.    Yes Historical Provider, MD  lisdexamfetamine (VYVANSE) 20 MG capsule Take 20 mg by mouth every morning.   Yes Historical Provider, MD  lurasidone (LATUDA) 40 MG TABS Take by mouth daily with breakfast.   Yes Historical Provider, MD  fluticasone (FLONASE) 50 MCG/ACT nasal spray Place 2 sprays into the nose daily.    Historical Provider, MD  pantoprazole (PROTONIX) 20 MG tablet Take 1 tablet (20 mg total) by mouth daily. 08/27/11 08/26/12  Cheri Guppy, MD    Review of Systems:  As per HPI, otherwise negative.    Physical Examination: Filed Vitals:   09/05/11 1336  BP: 115/80  Pulse: 96  Temp: 99.1 F (37.3 C)  Resp: 18    There were no vitals filed for this visit. There is no height or weight on file to calculate BMI. Ideal Body Weight:     GEN: WDWN, NAD, Non-toxic, Alert & Oriented x 3 HEENT: Atraumatic, Normocephalic.  Ears and Nose: No external deformity. EXTR: No clubbing/cyanosis/edema NEURO: Normal gait.  PSYCH: Normally interactive. Conversant. Not depressed or anxious appearing.  Calm demeanor.  Ext:  Swollen lateral ankle.  Ecchymotic, no deformity,  NATI  Assessment and Plan: Sprained ankle RICE Crutches for WBAT Boot  [facility] reading (PRIMARY) by  Dr. Dareen Piano and were negative.   Carmelina Dane, MD

## 2011-09-05 NOTE — Patient Instructions (Signed)
Ankle Sprain An ankle sprain is an injury to the strong, fibrous tissues (ligaments) that hold the bones of your ankle joint together.  CAUSES Ankle sprain usually is caused by a fall or by twisting your ankle. People who participate in sports are more prone to these types of injuries.  SYMPTOMS  Symptoms of ankle sprain include:  Pain in your ankle. The pain may be present at rest or only when you are trying to stand or walk.   Swelling.   Bruising. Bruising may develop immediately or within 1 to 2 days after your injury.   Difficulty standing or walking.  DIAGNOSIS  Your caregiver will ask you details about your injury and perform a physical exam of your ankle to determine if you have an ankle sprain. During the physical exam, your caregiver will press and squeeze specific areas of your foot and ankle. Your caregiver will try to move your ankle in certain ways. An X-ray exam may be done to be sure a bone was not broken or a ligament did not separate from one of the bones in your ankle (avulsion).  TREATMENT  Certain types of braces can help stabilize your ankle. Your caregiver can make a recommendation for this. Your caregiver may recommend the use of medication for pain. If your sprain is severe, your caregiver may refer you to a surgeon who helps to restore function to parts of your skeletal system (orthopedist) or a physical therapist. HOME CARE INSTRUCTIONS  Apply ice to your injury for 1 to 2 days or as directed by your caregiver. Applying ice helps to reduce inflammation and pain.  Put ice in a plastic bag.   Place a towel between your skin and the bag.   Leave the ice on for 15 to 20 minutes at a time, every 2 hours while you are awake.   Take over-the-counter or prescription medicines for pain, discomfort, or fever only as directed by your caregiver.   Keep your injured leg elevated, when possible, to lessen swelling.   If your caregiver recommends crutches, use them as  instructed. Gradually, put weight on the affected ankle. Continue to use crutches or a cane until you can walk without feeling pain in your ankle.   If you have a plaster splint, wear the splint as directed by your caregiver. Do not rest it on anything harder than a pillow the first 24 hours. Do not put weight on it. Do not get it wet. You may take it off to take a shower or bath.   You may have been given an elastic bandage to wear around your ankle to provide support. If the elastic bandage is too tight (you have numbness or tingling in your foot or your foot becomes cold and blue), adjust the bandage to make it comfortable.   If you have an air splint, you may blow more air into it or let air out to make it more comfortable. You may take your splint off at night and before taking a shower or bath.   Wiggle your toes in the splint several times per day if you are able.  SEEK MEDICAL CARE IF:   You have an increase in bruising, swelling, or pain.   Your toes feel cold.   Pain relief is not achieved with medication.  SEEK IMMEDIATE MEDICAL CARE IF: Your toes are numb or blue or you have severe pain. MAKE SURE YOU:   Understand these instructions.   Will watch your condition.     Will get help right away if you are not doing well or get worse.  Document Released: 02/26/2005 Document Revised: 02/15/2011 Document Reviewed: 10/01/2007 [employer] Patient Information 2012 [employer]. 

## 2011-09-06 NOTE — Telephone Encounter (Signed)
A note will not get her out of a ticket for parking in a handicapped spot. She still has to obey the law.   Please fill out a temporary handicapped form and have patient come pick up.

## 2011-09-06 NOTE — Telephone Encounter (Signed)
Spoke with patient and let her know that we would be happy to write her a note for closer parking but handicap placard was not necessary.  Patient said she would take the note if it would get her out of ticket for parking in handicap.

## 2011-09-07 NOTE — Telephone Encounter (Signed)
Patient spoke with Dolores Frame and was notified.

## 2011-09-07 NOTE — Telephone Encounter (Signed)
Left message to return call. Temporary handicapped placard filled out and ready to be picked up.

## 2011-09-10 ENCOUNTER — Ambulatory Visit (INDEPENDENT_AMBULATORY_CARE_PROVIDER_SITE_OTHER): Payer: BC Managed Care – PPO | Admitting: Physician Assistant

## 2011-09-10 VITALS — BP 126/86 | HR 102 | Temp 98.5°F | Resp 16 | Ht 68.0 in | Wt 210.0 lb

## 2011-09-10 DIAGNOSIS — M25579 Pain in unspecified ankle and joints of unspecified foot: Secondary | ICD-10-CM

## 2011-09-10 DIAGNOSIS — S93409A Sprain of unspecified ligament of unspecified ankle, initial encounter: Secondary | ICD-10-CM

## 2011-09-10 NOTE — Progress Notes (Signed)
  Subjective:    Patient ID: Deborah Soto, female    DOB: 1965/05/31, 46 y.o.   MRN: [id number]  HPI Patient presents for follow up on her left ankle injury. DOI 09/05/11. She says the pain and swelling have improved significantly. She is here because at her last visit they discussed that as the pain improved, she could transition from the short cam to an aircast. She doesn't think she is ready just yet, but they are leaving today to go to the beach for a week and she believes she will want to transition before returning. Says pain with ambulation has improved. She is stiff today because she did a lot of walking yesterday. Has not been taking the hydrocodone anymore. Is only using Motrin as needed. No paresthesias, weakness, or locking/popping.     Review of Systems  All other systems reviewed and are negative.       Objective:   Physical Exam  Constitutional: She is oriented to person, place, and time. She appears well-developed and well-nourished.  HENT:  Head: Normocephalic and atraumatic.  Right Ear: External ear normal.  Left Ear: External ear normal.  Eyes: Conjunctivae are normal.  Neck: Normal range of motion.  Cardiovascular: Normal rate, regular rhythm and normal heart sounds.   Pulmonary/Chest: Effort normal and breath sounds normal.  Musculoskeletal: Normal range of motion.       Left ankle: She exhibits swelling (slight swelling of medial malleolus) and ecchymosis. She exhibits normal range of motion and normal pulse. tenderness (+pain to palpation of medial malleolus).  Neurological: She is alert and oriented to person, place, and time.  Psychiatric: She has a normal mood and affect. Her behavior is normal. Judgment and thought content normal.          Assessment & Plan:   1. Ankle sprain  Continue Motrin as needed for pain Will fit with aircast today so she can take this with her on her trip. Continue to wear short cam until pain with ambulation has resolved  then transition to aircast Follow up as needed.   2. Ankle pain

## 2011-10-03 ENCOUNTER — Encounter: Payer: BC Managed Care – PPO | Admitting: Internal Medicine

## 2011-10-10 ENCOUNTER — Encounter: Payer: Self-pay | Admitting: Internal Medicine

## 2011-10-10 ENCOUNTER — Ambulatory Visit (INDEPENDENT_AMBULATORY_CARE_PROVIDER_SITE_OTHER): Payer: BC Managed Care – PPO | Admitting: Internal Medicine

## 2011-10-10 VITALS — BP 133/87 | HR 97 | Temp 98.4°F | Resp 16 | Ht 68.0 in | Wt 227.0 lb

## 2011-10-10 DIAGNOSIS — J309 Allergic rhinitis, unspecified: Secondary | ICD-10-CM | POA: Insufficient documentation

## 2011-10-10 DIAGNOSIS — O1490 Unspecified pre-eclampsia, unspecified trimester: Secondary | ICD-10-CM

## 2011-10-10 DIAGNOSIS — K5792 Diverticulitis of intestine, part unspecified, without perforation or abscess without bleeding: Secondary | ICD-10-CM | POA: Insufficient documentation

## 2011-10-10 DIAGNOSIS — Z6834 Body mass index (BMI) 34.0-34.9, adult: Secondary | ICD-10-CM

## 2011-10-10 DIAGNOSIS — F39 Unspecified mood [affective] disorder: Secondary | ICD-10-CM

## 2011-10-10 DIAGNOSIS — Z0000 Encounter for general adult medical examination without abnormal findings: Secondary | ICD-10-CM

## 2011-10-10 DIAGNOSIS — F319 Bipolar disorder, unspecified: Secondary | ICD-10-CM

## 2011-10-10 DIAGNOSIS — I2699 Other pulmonary embolism without acute cor pulmonale: Secondary | ICD-10-CM | POA: Insufficient documentation

## 2011-10-10 HISTORY — DX: Unspecified pre-eclampsia, unspecified trimester: O14.90

## 2011-10-10 LAB — CBC WITH DIFFERENTIAL/PLATELET
Basophils Absolute: 0 10*3/uL (ref 0.0–0.1)
Basophils Relative: 0 % (ref 0–1)
Eosinophils Absolute: 0.1 10*3/uL (ref 0.0–0.7)
Eosinophils Relative: 2 % (ref 0–5)
HCT: 40.7 % (ref 36.0–46.0)
Hemoglobin: 13.9 g/dL (ref 12.0–15.0)
Lymphocytes Relative: 30 % (ref 12–46)
Lymphs Abs: 1.9 10*3/uL (ref 0.7–4.0)
MCH: 30 pg (ref 26.0–34.0)
MCHC: 34.2 g/dL (ref 30.0–36.0)
MCV: 87.9 fL (ref 78.0–100.0)
Monocytes Absolute: 0.5 10*3/uL (ref 0.1–1.0)
Monocytes Relative: 9 % (ref 3–12)
Neutro Abs: 3.7 10*3/uL (ref 1.7–7.7)
Neutrophils Relative %: 59 % (ref 43–77)
Platelets: 318 10*3/uL (ref 150–400)
RBC: 4.63 MIL/uL (ref 3.87–5.11)
RDW: 14.2 % (ref 11.5–15.5)
WBC: 6.2 10*3/uL (ref 4.0–10.5)

## 2011-10-10 LAB — COMPREHENSIVE METABOLIC PANEL
ALT: 13 U/L (ref 0–35)
AST: 11 U/L (ref 0–37)
Albumin: 4.6 g/dL (ref 3.5–5.2)
Alkaline Phosphatase: 43 U/L (ref 39–117)
BUN: 9 mg/dL (ref 6–23)
CO2: 24 mEq/L (ref 19–32)
Calcium: 9.3 mg/dL (ref 8.4–10.5)
Chloride: 104 mEq/L (ref 96–112)
Creat: 0.65 mg/dL (ref 0.50–1.10)
Glucose, Bld: 102 mg/dL — ABNORMAL HIGH (ref 70–99)
Potassium: 4 mEq/L (ref 3.5–5.3)
Sodium: 141 mEq/L (ref 135–145)
Total Bilirubin: 0.5 mg/dL (ref 0.3–1.2)
Total Protein: 6.6 g/dL (ref 6.0–8.3)

## 2011-10-10 LAB — LIPID PANEL
Cholesterol: 203 mg/dL — ABNORMAL HIGH (ref 0–200)
HDL: 48 mg/dL (ref >39)
LDL Cholesterol: 130 mg/dL — ABNORMAL HIGH (ref 0–99)
Total CHOL/HDL Ratio: 4.2 Ratio
Triglycerides: 123 mg/dL (ref <150)
VLDL: 25 mg/dL (ref 0–40)

## 2011-10-10 LAB — POCT GLYCOSYLATED HEMOGLOBIN (HGB A1C): Hemoglobin A1C: 5.2

## 2011-10-10 LAB — TSH: TSH: 0.689 u[IU]/mL (ref 0.350–4.500)

## 2011-10-10 NOTE — Progress Notes (Signed)
  Subjective:    Patient ID: Deborah Soto, female    DOB: 1965-04-26, 46 y.o.   MRN: [id number]  HPIAnnual physical No new problems Life very full with 3 kids all in sports Main issue is inability lose weight-has been as low as 186 but now to 225 can't maintain exercise program/currently ankle spr/Diet poor  Patient Active Problem List  Diagnosis  . PTE (pulmonary thromboembolism)-1996  . Preeclampsia-1999  . Diverticulitis-2009  . Bipolar affective disorder-[place]ottle-Current outpatient prescriptions:DULoxetine (CYMBALTA) 60 MG capsule, Take 60 mg by mouth daily lamoTRIgine (LAMICTAL) 150 MG tablet, Take 300 mg by mouth daily. , lurasidone (LATUDA) 40 MG TABS, Take by mouth daily with breakfast.,   . BMI 34.0-34.9,adult  . AR (allergic rhinitis)-Flomax     Review of Systems 13 system check list negative    Objective:   Physical Exam Overweight with stable vital signs HEENT clear No thyromegaly or lymphadenopathy Heart regular without murmur Lungs clear to auscultation Abdomen soft nontender nondistended without organomegaly Extremities with full range of motion and no joint abnormalities Neurological intact Psychiatric-moods good/affect appropriate/thought intact/judgment Sound     Assessment & Plan:  Annual physical examination Problem #1 obesity Problem #2 bipolar disorder with medications that cause obesity 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

## 2011-10-13 ENCOUNTER — Encounter: Payer: Self-pay | Admitting: Internal Medicine

## 2011-10-16 ENCOUNTER — Telehealth: Payer: Self-pay

## 2011-10-16 NOTE — Telephone Encounter (Signed)
Message for Dr. Merla Riches:   Pt went to see Dr. Jennelle Human as requested and he suggested metformin as well. Dr Jennelle Human recommends pt follow up here. She would like to know if she should come in to see you?  Best [phone]

## 2011-10-16 NOTE — Telephone Encounter (Signed)
Lets start metformin and see her in 4-6 weeks May send in metf 500ER #90 1 at evening meal

## 2011-10-17 MED ORDER — METFORMIN HCL ER 500 MG PO TB24: 500.0000 mg | ORAL_TABLET | Freq: Every day | ORAL | Status: DC

## 2011-10-17 NOTE — Telephone Encounter (Signed)
This is sent in, I have called patient to advise. If she needs anything further she will call me.

## 2012-04-07 ENCOUNTER — Ambulatory Visit (INDEPENDENT_AMBULATORY_CARE_PROVIDER_SITE_OTHER): Payer: BC Managed Care – PPO | Admitting: Sports Medicine

## 2012-04-07 VITALS — BP 135/92 | Ht 68.0 in | Wt 225.0 lb

## 2012-04-07 DIAGNOSIS — M24873 Other specific joint derangements of unspecified ankle, not elsewhere classified: Secondary | ICD-10-CM

## 2012-04-07 DIAGNOSIS — M25373 Other instability, unspecified ankle: Secondary | ICD-10-CM

## 2012-04-07 DIAGNOSIS — M24876 Other specific joint derangements of unspecified foot, not elsewhere classified: Secondary | ICD-10-CM

## 2012-04-07 DIAGNOSIS — M7660 Achilles tendinitis, unspecified leg: Secondary | ICD-10-CM

## 2012-04-08 DIAGNOSIS — M25373 Other instability, unspecified ankle: Secondary | ICD-10-CM | POA: Insufficient documentation

## 2012-04-08 DIAGNOSIS — M7660 Achilles tendinitis, unspecified leg: Secondary | ICD-10-CM | POA: Insufficient documentation

## 2012-04-08 NOTE — Progress Notes (Signed)
Subjective:    Patient ID: Deborah Soto, female    DOB: Apr 02, 1965, 47 y.o.   MRN: [id number]  HPI chief complaint: Bilateral Achilles pain  Very pleasant 47 year old female comes in today complaining of 2 months of pain along each Achilles tendon. No trauma. Right Achilles is more symptomatic than the left. She has noticed increased pain and swelling along the Achilles tendon with activity. She has been seeing Thereasa Distance for chiropractic treatment and treatment for her Achilles tendons. His treatment has helped but has not resulted in complete resolution of her pain. She denies significant problems with her Achilles in the past but she does have a history of left ankle instability. Symptoms are severe enough that she has sought treatment from Dr. Victorino Dike who has recommended surgical ankle reconstruction. Patient is currently not interested in surgery. She has completed a full course of physical therapy with Ellamae Sia and this has helped her tremendously but unfortunately she suffered another episode of instability a couple weeks ago.  Medical history as well as current medications are reviewed on the chart. She has a history of PE, diverticulitis, and bipolar affective disorder Medications include Lamictal, Cymbalta, Latuda Socially she is a former smoker, and alcohol 2-3 times a week, does not list an employer on her patient questionnaire    Review of Systems     Objective:   Physical Exam Well-developed, overweight 47 year old female. No acute distress. Awake alert and oriented x3. Vital signs are reviewed.  Right ankle: There is fusiform swelling of the Achilles tendon proximal to the insertion on the calcaneus. It is tender to palpation. Pain with Achilles stretching. Good plantar flexion with Thompson's testing. Good strength. Full range of motion. Negative anterior drawer. 1-2+ talar tilt. Neurovascularly intact distally.  Left ankle: Slight swelling of the Achilles  tendon just proximal to the insertion of the calcaneus but not as pronounced as that seen on the right. Slight tenderness to palpation. Pain with Achilles stretching. Good strength. There is tenderness to palpation along the ATF and calcaneofibular ligament. Positive anterior drawer, 2-3+ talar tilt. Neurovascular intact distally. Walking with a slight limp.  MSK ultrasound of each of her Achilles tendons are performed. Images were obtained in long and short plane. There is significant swelling of the right Achilles tendon proximal to the insertion on the calcaneus. It measures 1.18 cm on the [place]. There is a layer of hypoechoic fluid between the tendon sheath and the tendon fibers consistent with tenosynovitis. There may be some slight tearing of the superficial-most fibers seen on the short view. The left Achilles tendon measures 0.61 cm which is the upper limits of normal. Again, there may be some slight tearing of the superficial-most fibers.       Assessment & Plan:  1. Bilateral Achilles tendinopathy, right greater than left 2. Chronic left ankle instability  Patient will start an Alfredson's heel drop exercise program. She will do the exercises daily. She will continue with her treatment with Thereasa Distance. Followup with me in 4 weeks. If symptoms persist, we will consider starting topical nitroglycerin at that time. In regards to her left ankle instability, I recommended that she continue with the rehabilitation exercises that she learned from Ellamae Sia. She admits that she does get instability with simply walking on flat surfaces so at some point she may be forced with facing a surgical reconstruction. I have offered the name of Dr. Richardson Landry for second opinion if she would like. He'll call with questions or  concerns prior to her next office visit.

## 2012-05-05 ENCOUNTER — Ambulatory Visit (INDEPENDENT_AMBULATORY_CARE_PROVIDER_SITE_OTHER): Payer: BC Managed Care – PPO | Admitting: Sports Medicine

## 2012-05-05 ENCOUNTER — Encounter: Payer: Self-pay | Admitting: Sports Medicine

## 2012-05-05 VITALS — BP 135/94 | HR 91 | Ht 68.0 in | Wt 225.0 lb

## 2012-05-05 DIAGNOSIS — M24873 Other specific joint derangements of unspecified ankle, not elsewhere classified: Secondary | ICD-10-CM

## 2012-05-05 DIAGNOSIS — M7660 Achilles tendinitis, unspecified leg: Secondary | ICD-10-CM

## 2012-05-05 DIAGNOSIS — M25372 Other instability, left ankle: Secondary | ICD-10-CM

## 2012-05-05 DIAGNOSIS — M7661 Achilles tendinitis, right leg: Secondary | ICD-10-CM

## 2012-05-05 DIAGNOSIS — M24876 Other specific joint derangements of unspecified foot, not elsewhere classified: Secondary | ICD-10-CM

## 2012-05-05 MED ORDER — NITROGLYCERIN 0.2 MG/HR TD PT24: MEDICATED_PATCH | TRANSDERMAL | Status: DC

## 2012-05-05 NOTE — Patient Instructions (Addendum)

## 2012-05-05 NOTE — Progress Notes (Signed)
  Subjective:    Patient ID: Deborah Soto, female    DOB: June 16, 1965, 47 y.o.   MRN: [id number]  HPI Deborah Soto comes in today for followup on bilateral Achilles tendinopathy. She has suffered yet another episode of left ankle instability. She has a body helix ankle compression sleeve. She has been doing her home exercises for both her ankle and her Achilles tendinopathy. Ankle pain and swelling persist but she states that her Achilles tendinopathy pain is about 50% better. She is very interested in starting the nitroglycerin protocol for the more symptomatic right Achilles tendon. She is also considering returning to Dr. Victorino Soto to further discuss surgery for her left ankle instability.    Review of Systems     Objective:   Physical Exam Well-developed, well-nourished. No acute distress. Awake alert and oriented x3  Left ankle: 1+ effusion. Patient still has a positive anterior drawer and 2-3+ talar tilt. Neurovascularly intact distally. Left Achilles tendon shows minimal swelling. Minimal tenderness to palpation and minimal tenderness with Achilles stretch. Right Achilles tendon still shows fusiform swelling of the tendon proximal to the insertion of the calcaneus. Still tender to palpation but not as severe as her last visit. Neurovascularly intact distally. Patient is walking without a significant limp.  MSK ultrasound of each of her Achilles tendons was performed. Images in longitudinal and transverse planes were obtained. There is still significant thickening of the right Achilles tendon. It measures 1.17 cm in the longitudinal plane and demonstrates minimal neovascularity. The left Achilles tendon is 0.56 cm. There is persistent fluid in the Achilles tendon sheath on the right. Overall, the exam appears to be unchanged from her last ultrasound.       Assessment & Plan:  1. Bilateral Achilles tendinopathy, right greater than left 2. Chronic left ankle instability  Patient will  continue with her heel drop exercises and we will add a quarter patch of nitroglycerin daily. She is cautioned about possible contact dermatitis and headaches. Overall she is 50% better and I'm encouraged by this initial improvement. I'll see her back in 4 weeks. In regards to her chronic left ankle instability, he will continue with her body helix compression sleeve. I do think that is reasonable for her to return to Dr. Victorino Soto to discuss ankle reconstruction.

## 2012-06-02 ENCOUNTER — Ambulatory Visit: Payer: BC Managed Care – PPO | Admitting: Sports Medicine

## 2012-06-10 ENCOUNTER — Ambulatory Visit: Payer: BC Managed Care – PPO | Admitting: Sports Medicine

## 2013-03-04 ENCOUNTER — Ambulatory Visit (INDEPENDENT_AMBULATORY_CARE_PROVIDER_SITE_OTHER): Payer: BC Managed Care – PPO | Admitting: Family Medicine

## 2013-03-04 ENCOUNTER — Ambulatory Visit: Payer: BC Managed Care – PPO

## 2013-03-04 VITALS — BP 128/82 | HR 108 | Temp 99.1°F | Resp 19 | Ht 68.0 in | Wt 206.0 lb

## 2013-03-04 DIAGNOSIS — R110 Nausea: Secondary | ICD-10-CM

## 2013-03-04 DIAGNOSIS — K5732 Diverticulitis of large intestine without perforation or abscess without bleeding: Secondary | ICD-10-CM

## 2013-03-04 DIAGNOSIS — F329 Major depressive disorder, single episode, unspecified: Secondary | ICD-10-CM

## 2013-03-04 DIAGNOSIS — R109 Unspecified abdominal pain: Secondary | ICD-10-CM

## 2013-03-04 LAB — POCT URINALYSIS DIPSTICK
Blood, UA: NEGATIVE
Glucose, UA: NEGATIVE
Ketones, UA: 15
Leukocytes, UA: NEGATIVE
Nitrite, UA: NEGATIVE
Protein, UA: 30
Spec Grav, UA: >=1.03
Urobilinogen, UA: 1
pH, UA: 5.5

## 2013-03-04 LAB — POCT UA - MICROSCOPIC ONLY
Bacteria, U Microscopic: NEGATIVE
Casts, Ur, LPF, POC: NEGATIVE
Crystals, Ur, HPF, POC: NEGATIVE
Mucus, UA: NEGATIVE
Yeast, UA: NEGATIVE

## 2013-03-04 LAB — POCT CBC
Granulocyte percent: 78.1 %G (ref 37–80)
HCT, POC: 46.6 % (ref 37.7–47.9)
Hemoglobin: 14.4 g/dL (ref 12.2–16.2)
Lymph, poc: 2.1 (ref 0.6–3.4)
MCH, POC: 30 pg (ref 27–31.2)
MCHC: 30.9 g/dL — AB (ref 31.8–35.4)
MCV: 97 fL (ref 80–97)
MID (cbc): 0.9 (ref 0–0.9)
MPV: 9.9 fL (ref 0–99.8)
POC Granulocyte: 10.7 — AB (ref 2–6.9)
POC LYMPH PERCENT: 15.1 %L (ref 10–50)
POC MID %: 6.8 %M (ref 0–12)
Platelet Count, POC: 249 10*3/uL (ref 142–424)
RBC: 4.8 M/uL (ref 4.04–5.48)
RDW, POC: 14.1 %
WBC: 13.7 10*3/uL — AB (ref 4.6–10.2)

## 2013-03-04 LAB — IFOBT (OCCULT BLOOD): IFOBT: NEGATIVE

## 2013-03-04 MED ORDER — ONDANSETRON 4 MG PO TBDP: ORAL_TABLET | ORAL | Status: DC

## 2013-03-04 MED ORDER — CEFTRIAXONE SODIUM 1 G IJ SOLR
1.0000 g | Freq: Once | INTRAMUSCULAR | Status: AC
  Administered 2013-03-04: 1 g via INTRAMUSCULAR

## 2013-03-04 MED ORDER — HYDROCODONE-ACETAMINOPHEN 5-325 MG PO TABS: 1.0000 | ORAL_TABLET | Freq: Four times a day (QID) | ORAL | Status: DC | PRN

## 2013-03-04 MED ORDER — METRONIDAZOLE 500 MG PO TABS
500.0000 mg | ORAL_TABLET | Freq: Three times a day (TID) | ORAL | Status: DC
Start: 2013-03-04 — End: 2014-07-29

## 2013-03-04 MED ORDER — CIPROFLOXACIN HCL 500 MG PO TABS: 500.0000 mg | ORAL_TABLET | Freq: Two times a day (BID) | ORAL | Status: AC

## 2013-03-04 NOTE — Progress Notes (Signed)
Subjective Patient was in the hospital several weeks ago and was switched to an [facility] for her depression. Since then she has not had a good appetite. Her bowels have been acting less frequently she's not eating as much. She did have a little bowel movement yesterday. For the last couple of days she's been hurting in the low abdomen. The pain is mostly on the left lower quadrant. She just recently had her menstrual cycle. She has nausea but no vomiting. She got worse last night. Maybe a low-grade fever. She does have a history of sometimes being told she had diverticulitis, she's not sure when that was. She has not had a colonoscopy ever.  Review of systems unremarkable except as pertaining to the abdomen and depression.  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   Objective: Patient is obvious uncomfortable with pain in the left lower quadrant. She points to that area and across the low abdomen as the area of tenderness. Chest clear. Heart regular without murmurs. Abdomen has active bowel sounds. Not tender to percussion. On palpation there is tenderness along the lower part of the descending colon and left lower quadrant, lots of tenderness just above the pelvic brim on the left lower quadrant, moderate tenderness across the low midabdomen, and mild tenderness in the right lower abdomen. Digital exam reveals mild tenderness on on gentle motion of the uterus. More vigorous motion of the uterus gets more pain. Bimanual exam reveals the right adnexal region to be nontender, in the center and left upper pelvis she is tender. I do not feel any masses. Digital rectal exam was normal and stool for occult blood was negative. Low-grade temperature of 99.1  Results for orders placed in visit on 03/04/13  POCT UA - MICROSCOPIC ONLY      Result Value Range   WBC, Ur, HPF, POC 0-2     RBC, urine, microscopic 0-1     Bacteria, U Microscopic neg     Mucus, UA neg     Epithelial cells,  urine per micros 0-2     Crystals, Ur, HPF, POC neg     Casts, Ur, LPF, POC neg     Yeast, UA neg    POCT URINALYSIS DIPSTICK      Result Value Range   Color, UA yellow     Clarity, UA cloudy     Glucose, UA neg     Bilirubin, UA moderate     Ketones, UA 15     Spec Grav, UA >=1.030     Blood, UA neg     pH, UA 5.5     Protein, UA 30     Urobilinogen, UA 1.0     Nitrite, UA neg     Leukocytes, UA Negative    IFOBT (OCCULT BLOOD)      Result Value Range   IFOBT Negative    POCT CBC      Result Value Range   WBC 13.7 (*) 4.6 - 10.2 K/uL   Lymph, poc 2.1  0.6 - 3.4   POC LYMPH PERCENT 15.1  10 - 50 %L   MID (cbc) 0.9  0 - 0.9   POC MID % 6.8  0 - 12 %M   POC Granulocyte 10.7 (*) 2 - 6.9   Granulocyte percent 78.1  37 - 80 %G   RBC 4.80  4.04 - 5.48 M/uL   Hemoglobin 14.4  12.2 - 16.2 g/dL   HCT, POC  46.6  37.7 - 47.9 %   MCV 97.0  80 - 97 fL   MCH, POC 30.0  27 - 31.2 pg   MCHC 30.9 (*) 31.8 - 35.4 g/dL   RDW, POC 32.4     Platelet Count, POC 249  142 - 424 K/uL   MPV 9.9  0 - 99.8 fL   [facility] reading (PRIMARY) by  Dr. Alwyn Ren Nonspecific abdomen.  Assessment: Low abdominal pain, left lower quadrant primarily Probable diverticulitis Depression on MAOI Mild dehydration with concenttated uring Nausea    Plan:  Reviewed each medication along the popliteus to make sure there is no interaction with the antidepressant patch 1 g ceftriaxone IM Metronidazole and ciprofloxacin Hydrocodone for severe pain Zofran for nausea MiraLax for bowels Emergency room at anytime if worse Return tomorrow afternoon if not improving at all, otherwise return Friday or Saturday for recheck.  Marland Kitchen

## 2013-03-04 NOTE — Patient Instructions (Addendum)
Drink plenty of fluids  Only eats bland things like crackers and toast or broth until you're feeling better  Take over-the-counter MiraLax one dose daily if needed to keep bowels moving  Take metronidazole 500 mg twice a day for infection  Take ciprofloxacin 500 mg twice daily for infection  Take the Zofran if needed for nausea. It can just be dissolved in the mouth and does not have to be swallowed  Use the hydrocodone only if needed for severe pain  Go to the emergency room if in all worse  Return Friday or Saturday for recheck unless much much better

## 2013-04-19 ENCOUNTER — Ambulatory Visit (INDEPENDENT_AMBULATORY_CARE_PROVIDER_SITE_OTHER): Payer: BC Managed Care – PPO | Admitting: Internal Medicine

## 2013-04-19 VITALS — BP 110/74 | HR 91 | Temp 98.5°F | Resp 18 | Ht 68.0 in | Wt 194.6 lb

## 2013-04-19 DIAGNOSIS — I809 Phlebitis and thrombophlebitis of unspecified site: Secondary | ICD-10-CM

## 2013-04-19 NOTE — Progress Notes (Signed)
Subjective:   This chart was scribed for Deborah Siaobert Heena Woodbury, MD by Arlan OrganAshley Leger, [facility] Sc[facility]. This patient was seen in room 14 and the patient's care was started 3:29 PM.    Patient ID: Deborah LeaMichelle F Desaulniers, female    DOB: Jul 12, 1965, 48 y.o.   MRN: [id number]  HPI  HPI Comments: Deborah Soto is a 48 y.o. Female with a PMHx of depression and embolism- blood clot who presents to [facility] complaining of right sided leg pain described as "soreness" that initially started this morning. Pt questions a potential "blood clot" in the right lower extremity. She reports mild worsening pain with ambulation and weight bearing. She denies any alleviating factors at this time. She denies any recent swelling to her lower extremities. She reports a family history of lower extremity blood clots (Mother). Pt has no other concerns this visit. She has had a pulmonary embolism in the past and the question of a hypercoagulability syndrome.  Patient Active Problem List   Diagnosis Date Noted  . Bipolar affective disorder 10/10/2011    Priority: High  . BMI 34.0-34.9,adult 10/10/2011    Priority: High  . Achilles tendonitis 04/08/2012  . 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 PTE (pulmonary thromboembolism) 10/10/2011  . Preeclampsia 10/10/2011  . Diverticulitis 10/10/2011  . AR (allergic rhinitis) 10/10/2011   Current outpatient prescriptions:  lurasidone (LATUDA) 40 MG TABS, Take by mouth daily with breakfast., Disp: , Rfl: ;   DULoxetine (CYMBALTA) 60 MG capsule, Take 60 mg by mouth daily., Disp: , Rfl: ;   fluticasone (FLONASE) 50 MCG/ACT nasal spray, Place 2 sprays into the nose daily., Disp: , Rfl:  HYDROcodone-acetaminophen (NORCO) 5-325 MG per tablet, Take 1 tablet by mouth every 6 (six) hours as needed., Disp:   ibuprofen (ADVIL,MOTRIN) 200 MG tablet, Take 400 mg by mouth every 6 (six) hours as needed. Pain, Disp: , Rfl: ;  lamoTRIgine (LAMICTAL) 150 MG  tablet, Take 300 mg by mouth daily. , Disp: , Rfl: ;   Lurasidone HCl 120 MG TABS, Take 60 mg by mouth daily., Disp: , Rfl:  metroNIDAZOLE (FLAGYL) 500 MG tablet, Take 1 tablet (500 mg total) by mouth 3 (three) times daily., Disp: 21 tablet, Rfl: 0;  nitroGLYCERIN (NITRODUR - DOSED IN MG/24 HR) 0.2 mg/hr, USE 1/4 OF A PATCH DAILY TO AA, Disp: 30 patch, Rfl: 1;  ondansetron (ZOFRAN ODT) 4 MG disintegrating tablet, Dissolve one in mouth every 4-6 hours as needed for nausea, Disp: 12 tablet, Rfl: 0 selegiline (EMSAM) 12 MG/24HR, Place 1 patch onto the skin daily., Disp: , Rfl:     Review of Systems  Constitutional: Negative for fever and chills.  Respiratory: Negative for cough, chest tightness and shortness of breath.   Cardiovascular: Negative for chest pain, palpitations and leg swelling.  Musculoskeletal: Positive for myalgias (Right leg pain).    Past Medical History  Diagnosis Date  . Depression   . Embolism - blood clot     legs & lung    No past surgical history on file.   Triage Vitals: BP 110/74  Pulse 91  Temp(Src) 98.5 F (36.9 C) (Oral)  Resp 18  Ht 5\' 8"  (1.727 m)  Wt 194 lb 9.6 oz (88.27 kg)  BMI 29.60 kg/m2  SpO2 99%  LMP 04/02/2013   Objective:  Physical Exam  Nursing note and vitals reviewed. Constitutional: She is oriented to person, place, and time. She appears well-developed and well-nourished.  HENT:  Head: Normocephalic and atraumatic.  Eyes: EOM are normal.  Neck: Normal range of motion.  Cardiovascular: Normal rate.   Pulmonary/Chest: Effort normal.  Musculoskeletal: Normal range of motion. She exhibits tenderness. She exhibits no edema.  Right calf has a tender thrombosed vain superficially over proximal aspect that does not extend into popliteal fossa. Homan's test Negative Upper right leg is non tender with no defects or masses  Neurological: She is alert and oriented to person, place, and time.  Skin: Skin is warm and dry.  Psychiatric: She  has a normal mood and affect. Her behavior is normal.   Assessment & Plan:  I have completed the patient encounter in its entirety as documented by the scribe, with editing by me where necessary. Natividad Halls P. Merla Riches, M.D. Superficial thrombophlebitis  Start 325 mg of aspirin daily Hot compresses 3 times a day for 30 minutes Compression stockings if desired Ibuprofen as needed for discomfort If this extends more deeply she will call to arrange ultrasound She has an extensive family history of varicosities veins and DVTs

## 2014-05-19 ENCOUNTER — Other Ambulatory Visit: Payer: Self-pay

## 2014-05-19 DIAGNOSIS — Z1231 Encounter for screening mammogram for malignant neoplasm of breast: Secondary | ICD-10-CM

## 2014-05-28 ENCOUNTER — Telehealth: Payer: Self-pay | Admitting: Internal Medicine

## 2014-05-28 ENCOUNTER — Ambulatory Visit
Admission: RE | Admit: 2014-05-28 | Discharge: 2014-05-28 | Disposition: A | Discharge status: Home / Self Care | Payer: BLUE CROSS/BLUE SHIELD | Source: Ambulatory Visit

## 2014-05-28 DIAGNOSIS — Z1231 Encounter for screening mammogram for malignant neoplasm of breast: Secondary | ICD-10-CM

## 2014-05-28 NOTE — Telephone Encounter (Signed)
An FYI

## 2014-05-28 NOTE — Telephone Encounter (Signed)
Patient states that when she went to have her mammogram done, they noticed a "weird crease" in her breast so they were unable to perform the mammogram. They advised her to go see her PCP Merla Riches([facility]) and have him look at it first. Patient also states that they will be faxing over a form that needs to be reviewed and signed. Even though Merla RichesDoolittle is her PCP she wants to know if someone else can sign this form if it comes in today. I gave patient [facility]'s walk in hours for Monday if she chooses to come in and see him.   [phone]

## 2014-06-02 ENCOUNTER — Other Ambulatory Visit: Payer: Self-pay | Admitting: Internal Medicine

## 2014-06-02 DIAGNOSIS — R234 Changes in skin texture: Secondary | ICD-10-CM

## 2014-06-18 ENCOUNTER — Ambulatory Visit
Admission: RE | Admit: 2014-06-18 | Discharge: 2014-06-18 | Disposition: A | Discharge status: Home / Self Care | Payer: 59 | Source: Ambulatory Visit | Attending: Internal Medicine | Admitting: Internal Medicine

## 2014-06-18 DIAGNOSIS — R234 Changes in skin texture: Secondary | ICD-10-CM

## 2014-07-29 ENCOUNTER — Ambulatory Visit (INDEPENDENT_AMBULATORY_CARE_PROVIDER_SITE_OTHER): Payer: 59 | Admitting: Family Medicine

## 2014-07-29 VITALS — BP 134/94 | HR 112 | Temp 98.5°F | Resp 18 | Ht 67.5 in | Wt 243.2 lb

## 2014-07-29 DIAGNOSIS — R1032 Left lower quadrant pain: Secondary | ICD-10-CM

## 2014-07-29 DIAGNOSIS — K5730 Diverticulosis of large intestine without perforation or abscess without bleeding: Secondary | ICD-10-CM | POA: Diagnosis not present

## 2014-07-29 DIAGNOSIS — R252 Cramp and spasm: Secondary | ICD-10-CM

## 2014-07-29 DIAGNOSIS — K5732 Diverticulitis of large intestine without perforation or abscess without bleeding: Secondary | ICD-10-CM | POA: Diagnosis not present

## 2014-07-29 LAB — POCT CBC
Granulocyte percent: 60.5 %G (ref 37–80)
HCT, POC: 42.7 % (ref 37.7–47.9)
Hemoglobin: 13.5 g/dL (ref 12.2–16.2)
Lymph, poc: 3.1 (ref 0.6–3.4)
MCH, POC: 27.2 pg (ref 27–31.2)
MCHC: 31.6 g/dL — AB (ref 31.8–35.4)
MCV: 85.9 fL (ref 80–97)
MID (cbc): 0.6 (ref 0–0.9)
MPV: 7.7 fL (ref 0–99.8)
POC Granulocyte: 5.7 (ref 2–6.9)
POC LYMPH PERCENT: 33.4 %L (ref 10–50)
POC MID %: 6.1 %M (ref 0–12)
Platelet Count, POC: 334 10*3/uL (ref 142–424)
RBC: 4.97 M/uL (ref 4.04–5.48)
RDW, POC: 14.3 %
WBC: 9.4 10*3/uL (ref 4.6–10.2)

## 2014-07-29 LAB — COMPREHENSIVE METABOLIC PANEL
ALT: 17 U/L (ref 0–35)
AST: 21 U/L (ref 0–37)
Albumin: 4.4 g/dL (ref 3.5–5.2)
Alkaline Phosphatase: 52 U/L (ref 39–117)
BUN: 14 mg/dL (ref 6–23)
CO2: 25 mEq/L (ref 19–32)
Calcium: 9.3 mg/dL (ref 8.4–10.5)
Chloride: 103 mEq/L (ref 96–112)
Creat: 0.54 mg/dL (ref 0.50–1.10)
Glucose, Bld: 80 mg/dL (ref 70–99)
Potassium: 4.2 mEq/L (ref 3.5–5.3)
Sodium: 140 mEq/L (ref 135–145)
Total Bilirubin: 0.4 mg/dL (ref 0.2–1.2)
Total Protein: 6.8 g/dL (ref 6.0–8.3)

## 2014-07-29 LAB — POCT UA - MICROSCOPIC ONLY
Bacteria, U Microscopic: NEGATIVE
Casts, Ur, LPF, POC: NEGATIVE
Crystals, Ur, HPF, POC: NEGATIVE
Mucus, UA: NEGATIVE
Yeast, UA: NEGATIVE

## 2014-07-29 LAB — POCT URINALYSIS DIPSTICK
Bilirubin, UA: NEGATIVE
Blood, UA: NEGATIVE
Glucose, UA: 100
Ketones, UA: NEGATIVE
Leukocytes, UA: NEGATIVE
Nitrite, UA: NEGATIVE
Protein, UA: NEGATIVE
Spec Grav, UA: >=1.03
Urobilinogen, UA: 0.2
pH, UA: 5

## 2014-07-29 MED ORDER — METRONIDAZOLE 500 MG PO TABS: 500.0000 mg | ORAL_TABLET | Freq: Three times a day (TID) | ORAL | Status: DC

## 2014-07-29 MED ORDER — CIPROFLOXACIN HCL 500 MG PO TABS: 500.0000 mg | ORAL_TABLET | Freq: Two times a day (BID) | ORAL | Status: DC

## 2014-07-29 NOTE — Patient Instructions (Signed)
Take ciprofloxacin one twice daily and metronidazole one 3 times daily.  No alcohol while on metronidazole or you will vomit  Return if abruptly worse at anytime  We will schedule you a colonoscopy evaluation sometime

## 2014-07-29 NOTE — Progress Notes (Signed)
Subjective: 49 year old lady who was here a few years ago with diverticulitis. She has never had a colonoscopy. Estrace started hurting the left lower quadrant of the abdomen hurt across lower abdomen. She does not sure if problems. No urinary difficulty. No problem with her bowels. No nausea or vomiting. She felt like she might be getting the diverticulitis against the skin on him.  Objective: Overweight lady. She had problems with depression in the medication she was put on midodrine about 50 or 60 pounds over the last year which is discouraging to her. She is morbidly obese. Her chest is clear. Heart regular without murmurs. Abdomen has bowel sounds present. Soft without masses. Is tender across the low abdomen.  Results for orders placed or performed in visit on 07/29/14  POCT CBC  Result Value Ref Range   WBC 9.4 4.6 - 10.2 K/uL   Lymph, poc 3.1 0.6 - 3.4   POC LYMPH PERCENT 33.4 10 - 50 %L   MID (cbc) 0.6 0 - 0.9   POC MID % 6.1 0 - 12 %M   POC Granulocyte 5.7 2 - 6.9   Granulocyte percent 60.5 37 - 80 %G   RBC 4.97 4.04 - 5.48 M/uL   Hemoglobin 13.5 12.2 - 16.2 g/dL   HCT, POC 16.142.7 09.637.7 - 47.9 %   MCV 85.9 80 - 97 fL   MCH, POC 27.2 27 - 31.2 pg   MCHC 31.6 (A) 31.8 - 35.4 g/dL   RDW, POC 04.514.3 %   Platelet Count, POC 334 142 - 424 K/uL   MPV 7.7 0 - 99.8 fL  POCT urinalysis dipstick  Result Value Ref Range   Color, UA yellow    Clarity, UA clear    Glucose, UA 100    Bilirubin, UA neg    Ketones, UA neg    Spec Grav, UA >=1.030    Blood, UA neg    pH, UA 5.0    Protein, UA neg    Urobilinogen, UA 0.2    Nitrite, UA neg    Leukocytes, UA Negative   POCT UA - Microscopic Only  Result Value Ref Range   WBC, Ur, HPF, POC 0-2    RBC, urine, microscopic 0-1    Bacteria, U Microscopic neg    Mucus, UA neg    Epithelial cells, urine per micros 0-1    Crystals, Ur, HPF, POC neg    Casts, Ur, LPF, POC neg    Yeast, UA neg    Assessment: Diverticulosis Low abdominal  pain Obesity Clinically diverticulitis  Plan: Cipro and Flagyl Have referred to gastroenterology for colonoscopy some time

## 2014-07-30 ENCOUNTER — Encounter: Payer: Self-pay | Admitting: Family Medicine

## 2014-09-27 ENCOUNTER — Other Ambulatory Visit: Payer: Self-pay | Admitting: Physician Assistant

## 2015-02-07 ENCOUNTER — Encounter: Payer: Self-pay | Admitting: Internal Medicine

## 2015-03-07 ENCOUNTER — Ambulatory Visit (INDEPENDENT_AMBULATORY_CARE_PROVIDER_SITE_OTHER): Payer: 59 | Admitting: Physician Assistant

## 2015-03-07 ENCOUNTER — Ambulatory Visit (INDEPENDENT_AMBULATORY_CARE_PROVIDER_SITE_OTHER): Payer: 59

## 2015-03-07 VITALS — BP 128/80 | HR 98 | Temp 98.9°F | Resp 17 | Ht 68.5 in | Wt 262.0 lb

## 2015-03-07 DIAGNOSIS — R0602 Shortness of breath: Secondary | ICD-10-CM

## 2015-03-07 DIAGNOSIS — R079 Chest pain, unspecified: Secondary | ICD-10-CM

## 2015-03-07 DIAGNOSIS — R5383 Other fatigue: Secondary | ICD-10-CM

## 2015-03-07 LAB — POCT CBC
Granulocyte percent: 63.4 %G (ref 37–80)
HCT, POC: 43.1 % (ref 37.7–47.9)
Hemoglobin: 14.7 g/dL (ref 12.2–16.2)
Lymph, poc: 2.9 (ref 0.6–3.4)
MCH, POC: 28.7 pg (ref 27–31.2)
MCHC: 34.1 g/dL (ref 31.8–35.4)
MCV: 84.1 fL (ref 80–97)
MID (cbc): 0.5 (ref 0–0.9)
MPV: 7.6 fL (ref 0–99.8)
POC Granulocyte: 5.8 (ref 2–6.9)
POC LYMPH PERCENT: 31.1 %L (ref 10–50)
POC MID %: 5.5 %M (ref 0–12)
Platelet Count, POC: 269 10*3/uL (ref 142–424)
RBC: 5.13 M/uL (ref 4.04–5.48)
RDW, POC: 14.9 %
WBC: 9.2 10*3/uL (ref 4.6–10.2)

## 2015-03-07 LAB — D-DIMER, QUANTITATIVE: D-Dimer, Quant: 0.32 ug/mL-FEU (ref 0.00–0.48)

## 2015-03-07 LAB — COMPLETE METABOLIC PANEL WITH GFR
ALT: 20 U/L (ref 6–29)
AST: 16 U/L (ref 10–35)
Albumin: 4.2 g/dL (ref 3.6–5.1)
Alkaline Phosphatase: 54 U/L (ref 33–115)
BUN: 14 mg/dL (ref 7–25)
CO2: 23 mmol/L (ref 20–31)
Calcium: 9 mg/dL (ref 8.6–10.2)
Chloride: 105 mmol/L (ref 98–110)
Creat: 0.58 mg/dL (ref 0.50–1.10)
GFR, Est African American: >89 mL/min (ref >=60)
GFR, Est Non African American: >89 mL/min (ref >=60)
Glucose, Bld: 118 mg/dL — ABNORMAL HIGH (ref 65–99)
Potassium: 4.4 mmol/L (ref 3.5–5.3)
Sodium: 137 mmol/L (ref 135–146)
Total Bilirubin: 0.4 mg/dL (ref 0.2–1.2)
Total Protein: 6.6 g/dL (ref 6.1–8.1)

## 2015-03-07 LAB — BRAIN NATRIURETIC PEPTIDE: Brain Natriuretic Peptide: 8.1 pg/mL (ref 0.0–100.0)

## 2015-03-07 LAB — TROPONIN I: Troponin I: <0.01 ng/mL (ref <0.06)

## 2015-03-07 NOTE — Progress Notes (Signed)
03/07/2015 8:50 PM   DOB: 11/11/1965 / MRN: [id number]  SUBJECTIVE:  Shortness of Breath The current episode started more than 1 month ago. The problem occurs intermittently. The problem has been waxing and waning. Associated symptoms include chest pain. Pertinent negatives include no abdominal pain, claudication, coryza, ear pain, fever, headaches, hemoptysis, leg pain, leg swelling, neck pain, orthopnea, PND, rash, rhinorrhea, sore throat, sputum production, swollen glands, syncope, vomiting or wheezing. The symptoms are aggravated by any activity, exercise and emotional upset. Risk factors: She has a history of DVT/PE. Distant 20 pack year history of smoking,, quit 6 years ago. She has tried nothing for the symptoms. Her past medical history is significant for DVT and PE. There is no history of CAD, COPD, a heart failure or a recent surgery.  Depression        This is a chronic problem.  The problem has been gradually worsening since onset.  Associated symptoms include decreased concentration, fatigue, hopelessness, insomnia, decreased interest and sad.  Associated symptoms include no myalgias, no headaches and no suicidal ideas.  Past treatments include SSRIs - Selective serotonin reuptake inhibitors, ECT - Electroconvulsive Therapy, psychotherapy and other medications (Medications managed by psychiatry.).  Compliance with treatment is variable.  Previous treatment provided mild relief.    She has No Known Allergies.   She  has a past medical history of Depression and Embolism - blood clot.    She  reports that she quit smoking about 10 years ago. She does not have any smokeless tobacco history on file. She reports that she does not drink alcohol or use illicit drugs. She  has no sexual activity history on file. The patient  has no past surgical history on file.  Her family history includes Dementia in her father.  Review of Systems  Constitutional: Positive for fatigue. Negative for fever.   HENT: Negative for ear pain, rhinorrhea and sore throat.   Respiratory: Positive for shortness of breath. Negative for hemoptysis, sputum production and wheezing.   Cardiovascular: Positive for chest pain. Negative for orthopnea, claudication, leg swelling, syncope and PND.  Gastrointestinal: Negative for vomiting and abdominal pain.  Musculoskeletal: Negative for myalgias and neck pain.  Skin: Negative for rash.  Neurological: Negative for headaches.  Psychiatric/Behavioral: Positive for depression and decreased concentration. Negative for suicidal ideas. The patient has insomnia.     Problem list and medications reviewed and updated by myself where necessary, and exist elsewhere in the encounter.   OBJECTIVE:  BP 128/80 mmHg  Pulse 98  Temp(Src) 98.9 F (37.2 C) (Oral)  Resp 17  Ht 5' 8.5" (1.74 m)  Wt 262 lb (118.842 kg)  BMI 39.25 kg/m2  SpO2 98%  LMP 02/05/2015  Physical Exam  Constitutional: She is oriented to person, place, and time. She appears well-nourished. No distress.  Eyes: EOM are normal. Pupils are equal, round, and reactive to light.  Cardiovascular: Normal rate and regular rhythm.  Exam reveals no gallop and no friction rub.   No murmur heard. Pulmonary/Chest: Effort normal and breath sounds normal.  Abdominal: She exhibits no distension.  Musculoskeletal: Normal range of motion.  Neurological: She is alert and oriented to person, place, and time. No cranial nerve deficit. Gait normal.  Skin: Skin is warm and dry. She is not diaphoretic.  Psychiatric: Her speech is normal and behavior is normal. Judgment and thought content normal. Her mood appears not anxious. Cognition and memory are normal. She exhibits a depressed mood.  Vitals reviewed.  Results for orders placed or performed in visit on 03/07/15 (from the past 48 hour(s))  Troponin I     Status: None   Collection Time: 03/07/15 11:27 AM  Result Value Ref Range   Troponin I <0.01 <0.06 ng/mL     Comment: No indication of myocardial injury.  COMPLETE METABOLIC PANEL WITH GFR     Status: Abnormal   Collection Time: 03/07/15 11:27 AM  Result Value Ref Range   Sodium 137 135 - 146 mmol/L   Potassium 4.4 3.5 - 5.3 mmol/L   Chloride 105 98 - 110 mmol/L   CO2 23 20 - 31 mmol/L   Glucose, Bld 118 (H) 65 - 99 mg/dL   BUN 14 7 - 25 mg/dL   Creat 1.20 3.22 - 5.20 mg/dL   Total Bilirubin 0.4 0.2 - 1.2 mg/dL   Alkaline Phosphatase 54 33 - 115 U/L   AST 16 10 - 35 U/L   ALT 20 6 - 29 U/L   Total Protein 6.6 6.1 - 8.1 g/dL   Albumin 4.2 3.6 - 5.1 g/dL   Calcium 9.0 8.6 - 13.3 mg/dL   GFR, Est African American >89 >=60 mL/min   GFR, Est Non African American >89 >=60 mL/min    Comment:   The estimated GFR is a calculation valid for adults (>=37 years old) that uses the CKD-EPI algorithm to adjust for age and sex. It is   not to be used for children, pregnant women, hospitalized patients,    patients on dialysis, or with rapidly changing kidney function. According to the NKDEP, eGFR >89 is normal, 60-89 shows mild impairment, 30-59 shows moderate impairment, 15-29 shows severe impairment and <15 is ESRD.     D-dimer, quantitative (not at [facility])     Status: None   Collection Time: 03/07/15 11:28 AM  Result Value Ref Range   D-Dimer, Quant 0.32 0.00 - 0.48 ug/mL-FEU    Comment: At the inhouse established cutoff value of 0.48 ug/mL FEU, this methology has been documented in the literature to have a sensitivity and negative predictive value of at least 98-99%.  The test result should be correlated with an assessment of the clinical probability of DVT/VTE.   POCT CBC     Status: None   Collection Time: 03/07/15 11:40 AM  Result Value Ref Range   WBC 9.2 4.6 - 10.2 K/uL   Lymph, poc 2.9 0.6 - 3.4   POC LYMPH PERCENT 31.1 10 - 50 %L   MID (cbc) 0.5 0 - 0.9   POC MID % 5.5 0 - 12 %M   POC Granulocyte 5.8 2 - 6.9   Granulocyte percent 63.4 37 - 80 %G   RBC 5.13 4.04 - 5.48 M/uL    Hemoglobin 14.7 12.2 - 16.2 g/dL   HCT, POC 97.8 85.0 - 47.9 %   MCV 84.1 80 - 97 fL   MCH, POC 28.7 27 - 31.2 pg   MCHC 34.1 31.8 - 35.4 g/dL   RDW, POC 93.6 %   Platelet Count, POC 269 142 - 424 K/uL   MPV 7.6 0 - 99.8 fL  Brain natriuretic peptide     Status: None   Collection Time: 03/07/15 11:45 AM  Result Value Ref Range   Brain Natriuretic Peptide 8.1 0.0 - 100.0 pg/mL    [facility] reading (PRIMARY) by  PA Iley Breeden: STAT read please comment.    ASSESSMENT AND PLAN  Journiee was seen today for shortness of breath, chest pain, dizziness,  nausea and depression.  Diagnoses and all orders for this visit:  Chest pain, unspecified chest pain type: I can not elicit her symptoms on exam.  Her EKG has not changed, BNP, D-Dimer, and Troponin are negative.  Chest rads negative for acute process and is negative for cardiomegaly. No wheezing appreciated on exam.  She has a history of depression and this is poorly controlled and may be contributing to her symptoms, however I am uncomfortable with her new onset dyspnea on exertion. Will send her to cardiology for an evaluation within the next two weeks.   -     EKG 12-Lead -     POCT CBC -     DG Chest 2 View; Future -     D-dimer, quantitative (not at [facility]) -     Troponin I -     COMPLETE METABOLIC PANEL WITH GFR -     Brain natriuretic peptide    The patient was advised to call or return to clinic if she does not see an improvement in symptoms or to seek the care of the closest emergency department if she worsens with the above plan.   Philis Fendt, MHS, PA-C [facility] 03/07/2015 8:50 PM

## 2015-03-14 ENCOUNTER — Ambulatory Visit (INDEPENDENT_AMBULATORY_CARE_PROVIDER_SITE_OTHER): Payer: 59 | Admitting: Internal Medicine

## 2015-03-14 VITALS — BP 151/97 | HR 97 | Temp 98.6°F | Resp 16 | Ht 68.5 in | Wt 268.2 lb

## 2015-03-14 DIAGNOSIS — R600 Localized edema: Secondary | ICD-10-CM

## 2015-03-14 DIAGNOSIS — F39 Unspecified mood [affective] disorder: Secondary | ICD-10-CM | POA: Diagnosis not present

## 2015-03-14 DIAGNOSIS — R609 Edema, unspecified: Secondary | ICD-10-CM | POA: Diagnosis not present

## 2015-03-14 DIAGNOSIS — R5383 Other fatigue: Secondary | ICD-10-CM

## 2015-03-14 DIAGNOSIS — Z86711 Personal history of pulmonary embolism: Secondary | ICD-10-CM

## 2015-03-14 DIAGNOSIS — R0602 Shortness of breath: Secondary | ICD-10-CM | POA: Diagnosis not present

## 2015-03-14 DIAGNOSIS — R635 Abnormal weight gain: Secondary | ICD-10-CM

## 2015-03-14 DIAGNOSIS — IMO0001 Reserved for inherently not codable concepts without codable children: Secondary | ICD-10-CM

## 2015-03-14 DIAGNOSIS — R030 Elevated blood-pressure reading, without diagnosis of hypertension: Secondary | ICD-10-CM | POA: Diagnosis not present

## 2015-03-14 LAB — POCT SEDIMENTATION RATE: POCT SED RATE: 16 mm/hr (ref 0–22)

## 2015-03-14 LAB — T4, FREE: Free T4: 1.01 ng/dL (ref 0.80–1.80)

## 2015-03-14 LAB — TSH: TSH: 1.219 u[IU]/mL (ref 0.350–4.500)

## 2015-03-14 MED ORDER — HYDROCHLOROTHIAZIDE 12.5 MG PO CAPS: 12.5000 mg | ORAL_CAPSULE | Freq: Every day | ORAL | Status: DC

## 2015-03-14 NOTE — Progress Notes (Addendum)
Subjective:    Patient ID: Deborah Soto, female    DOB: 23-Nov-1965, 50 y.o.   MRN: [id number]  HPI . Chief Complaint  Patient presents with  . Shortness of Breath    x 1 week, pt feels tightness in chest  . Numbness    right arm  . Foot Swelling    arm swelling, right     HPI Comments: Deborah Soto is a 50 y.o. female who presents to the [facility] complaining of SOB with associated chest tightness, chest heaviness and fatigue. Pt states she wakes up during the night with SOB and has SOB with walking short distances. Progressively worse over last 3-4 weeks. She has had leg swelling over 2-3 months but does not recall if swelling has become worse with SOB. She has also noticed weight gain over the past year. She was seen here 1 week ago for SOB. During that visit EKG was unchanged, BNP, D-dimer and troponin were negative and CXR normal.  She has an appointment with cardiology 1/23 but is fearful she is getting worse. Important past hx of DVTs and PTEs.  She denies fever, chills, cough, congestion. abominal pain, nausea, and emesis.   There is a long and turbulaent hx of Bipolar illness with many meds and many psychiatrists, hospitalizations, even ECT .She had a recent medication change to rexulti replacing mirapex for her depression, which she started this week, prescribed by Dr. Jennelle Humanottle. She was sent to Dr. Quintella ReichertAiken last year for a second opinion.  She also complains low back pain-intermittent-not altering activity. No GU sxtoms.    Patient Active Problem List   Diagnosis Date Noted  . Achilles tendonitis 04/08/2012  . 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 PTE (pulmonary thromboembolism) (HCC) 10/10/2011  . Preeclampsia 10/10/2011  . Diverticulitis 10/10/2011  . Bipolar affective disorder (HCC) 10/10/2011  . BMI 34.0-34.9,adult 10/10/2011  . AR (allergic rhinitis) 10/10/2011   Past Medical History  Diagnosis Date  . Depression   . Embolism - blood clot      legs & lung    No Known Allergies Prior to Admission medications      Social History Narrative--married/3 kids   Review of Systems  Constitutional: Positive for activity change, fatigue and unexpected weight change. Negative for fever, chills, diaphoresis and appetite change.  HENT: Negative for congestion, sore throat and trouble swallowing.   Eyes: Negative for visual disturbance.  Respiratory: Positive for chest tightness and shortness of breath. Negative for cough and wheezing.   Cardiovascular: Positive for leg swelling. Negative for chest pain and palpitations.  Gastrointestinal: Negative for nausea, vomiting and abdominal pain.  Genitourinary: Negative for difficulty urinating.  Musculoskeletal: Positive for back pain.  Neurological: Negative for headaches.   Objective:   Physical Exam  Constitutional: She is oriented to person, place, and time. She appears well-developed and well-nourished. No distress.  HENT:  Head: Normocephalic and atraumatic.  Eyes: Conjunctivae and EOM are normal.  Neck: Neck supple. No thyromegaly present.  Cardiovascular: Regular rhythm, normal heart sounds and intact distal pulses.  Exam reveals no gallop.   No murmur heard. Rate 100 No JVD  Pulmonary/Chest: Effort normal and breath sounds normal. She has no wheezes. She has no rales. She exhibits no tenderness.  Musculoskeletal: Normal range of motion. She exhibits no tenderness.  Extremities have 3+ pitting edema bilaterally Non tender to deep palpation in lower extr  Neurological: She is alert and oriented to person, place, and time.  Skin: Skin is  warm and dry.  Psychiatric: She has a normal mood and affect. Her behavior is normal.  Nursing note and vitals reviewed.   Filed Vitals:   03/14/15 1548  BP: 151/97  Pulse: 97  Temp: 98.6 F (37 C)  TempSrc: Oral  Resp: 16  Height: 5' 8.5" (1.74 m)  Weight: 268 lb 3.2 oz (121.655 kg)  SpO2: 98%   Wt Readings from Last 3 Encounters:   03/14/15 268 lb 3.2 oz (121.655 kg)  03/07/15 262 lb (118.842 kg)  07/29/14 243 lb 3.2 oz (110.315 kg)    Assessment & Plan:   1. SOB (shortness of breath) w/ DOE and easy fatigability  2. Edema extremities   3. Episodic mood disorder (HCC)   4. Weight gain   5. Other fatigue   6. Hx pulmonary embolism /DVT    Orders Placed This Encounter  Procedures  . CT Angio Chest W/Cm &/Or Wo Cm    Standing Status: Future     Number of Occurrences:      Standing Expiration Date: 06/11/2016    Order Specific Question:  If indicated for the ordered procedure, I authorize the administration of contrast media per Radiology protocol    Answer:  Yes    Order Specific Question:  Reason for Exam (SYMPTOM  OR DIAGNOSIS REQUIRED)    Answer:  r/o PTE/well's criteria 4.5    Order Specific Question:  Is the patient pregnant?    Answer:  No    Order Specific Question:  Preferred imaging location?    Answer:  [facility]  . TSH  . T4, free  . POCT SEDIMENTATION RATE  (these labs wnl)  Meds ordered this encounter  Medications  . hydrochlorothiazide (MICROZIDE) 12.5 MG capsule    Sig: Take 1 capsule (12.5 mg total) by mouth daily.    Dispense:  30 capsule    Refill:  0    I have completed the patient encounter in its entirety as documented by the scribe, with editing by me where necessary. Aleyza Salmi P. Merla Riches, M.D.

## 2015-03-14 NOTE — Patient Instructions (Signed)
hctz 12. 5 take 2 a day together for first 3 d then one a day

## 2015-03-23 ENCOUNTER — Emergency Department (HOSPITAL_COMMUNITY): Payer: 59

## 2015-03-23 ENCOUNTER — Inpatient Hospital Stay (HOSPITAL_COMMUNITY)
Admission: EM | Admit: 2015-03-23 | Discharge: 2015-03-24 | DRG: 176 | Disposition: A | Discharge status: Home / Self Care | Payer: 59 | Attending: Family Medicine | Admitting: Family Medicine

## 2015-03-23 ENCOUNTER — Inpatient Hospital Stay (HOSPITAL_COMMUNITY): Payer: 59

## 2015-03-23 ENCOUNTER — Encounter (HOSPITAL_COMMUNITY): Payer: Self-pay | Admitting: *Deleted

## 2015-03-23 DIAGNOSIS — Z87891 Personal history of nicotine dependence: Secondary | ICD-10-CM

## 2015-03-23 DIAGNOSIS — F319 Bipolar disorder, unspecified: Secondary | ICD-10-CM | POA: Diagnosis present

## 2015-03-23 DIAGNOSIS — I2699 Other pulmonary embolism without acute cor pulmonale: Secondary | ICD-10-CM

## 2015-03-23 DIAGNOSIS — Z8249 Family history of ischemic heart disease and other diseases of the circulatory system: Secondary | ICD-10-CM | POA: Diagnosis not present

## 2015-03-23 DIAGNOSIS — R079 Chest pain, unspecified: Secondary | ICD-10-CM | POA: Insufficient documentation

## 2015-03-23 DIAGNOSIS — R0600 Dyspnea, unspecified: Secondary | ICD-10-CM

## 2015-03-23 DIAGNOSIS — Z86711 Personal history of pulmonary embolism: Secondary | ICD-10-CM

## 2015-03-23 DIAGNOSIS — I10 Essential (primary) hypertension: Secondary | ICD-10-CM | POA: Diagnosis present

## 2015-03-23 DIAGNOSIS — Z86718 Personal history of other venous thrombosis and embolism: Secondary | ICD-10-CM

## 2015-03-23 DIAGNOSIS — Z79899 Other long term (current) drug therapy: Secondary | ICD-10-CM

## 2015-03-23 LAB — D-DIMER, QUANTITATIVE: D-Dimer, Quant: 0.65 ug/mL-FEU — ABNORMAL HIGH (ref 0.00–0.50)

## 2015-03-23 LAB — HEPARIN LEVEL (UNFRACTIONATED): Heparin Unfractionated: 0.25 IU/mL — ABNORMAL LOW (ref 0.30–0.70)

## 2015-03-23 LAB — BASIC METABOLIC PANEL
Anion gap: 13 (ref 5–15)
BUN: 10 mg/dL (ref 6–20)
CO2: 23 mmol/L (ref 22–32)
Calcium: 9.5 mg/dL (ref 8.9–10.3)
Chloride: 102 mmol/L (ref 101–111)
Creatinine, Ser: 0.77 mg/dL (ref 0.44–1.00)
GFR calc Af Amer: >60 mL/min (ref >60)
GFR calc non Af Amer: >60 mL/min (ref >60)
Glucose, Bld: 273 mg/dL — ABNORMAL HIGH (ref 65–99)
Potassium: 3.6 mmol/L (ref 3.5–5.1)
Sodium: 138 mmol/L (ref 135–145)

## 2015-03-23 LAB — CBC
HCT: 42.3 % (ref 36.0–46.0)
Hemoglobin: 13.7 g/dL (ref 12.0–15.0)
MCH: 28.7 pg (ref 26.0–34.0)
MCHC: 32.4 g/dL (ref 30.0–36.0)
MCV: 88.5 fL (ref 78.0–100.0)
Platelets: 274 10*3/uL (ref 150–400)
RBC: 4.78 MIL/uL (ref 3.87–5.11)
RDW: 14.3 % (ref 11.5–15.5)
WBC: 9.3 10*3/uL (ref 4.0–10.5)

## 2015-03-23 LAB — I-STAT TROPONIN, ED: Troponin i, poc: 0.01 ng/mL (ref 0.00–0.08)

## 2015-03-23 LAB — PROTIME-INR
INR: 1.18 (ref 0.00–1.49)
Prothrombin Time: 15.2 seconds (ref 11.6–15.2)

## 2015-03-23 LAB — SEDIMENTATION RATE: Sed Rate: 32 mm/hr — ABNORMAL HIGH (ref 0–22)

## 2015-03-23 LAB — APTT: aPTT: 61 seconds — ABNORMAL HIGH (ref 24–37)

## 2015-03-23 MED ORDER — HYDROCHLOROTHIAZIDE 12.5 MG PO CAPS
12.5000 mg | ORAL_CAPSULE | Freq: Every day | ORAL | Status: DC
  Administered 2015-03-24: 12.5 mg via ORAL
  Filled 2015-03-23: qty 1

## 2015-03-23 MED ORDER — IOHEXOL 350 MG/ML SOLN
100.0000 mL | Freq: Once | INTRAVENOUS | Status: AC | PRN
  Administered 2015-03-23: 100 mL via INTRAVENOUS

## 2015-03-23 MED ORDER — SODIUM CHLORIDE 0.9 % IV BOLUS (SEPSIS)
500.0000 mL | Freq: Once | INTRAVENOUS | Status: AC
  Administered 2015-03-23: 500 mL via INTRAVENOUS

## 2015-03-23 MED ORDER — HYDROMORPHONE HCL 1 MG/ML IJ SOLN
0.5000 mg | INTRAMUSCULAR | Status: DC | PRN
  Administered 2015-03-24: 0.5 mg via INTRAVENOUS
  Filled 2015-03-23: qty 1

## 2015-03-23 MED ORDER — HEPARIN BOLUS VIA INFUSION
4000.0000 [IU] | Freq: Once | INTRAVENOUS | Status: AC
  Administered 2015-03-23: 4000 [IU] via INTRAVENOUS
  Filled 2015-03-23: qty 4000

## 2015-03-23 MED ORDER — ACETAMINOPHEN 325 MG PO TABS
650.0000 mg | ORAL_TABLET | Freq: Four times a day (QID) | ORAL | Status: DC | PRN
  Administered 2015-03-23 – 2015-03-24 (×2): 650 mg via ORAL
  Filled 2015-03-23 (×2): qty 2

## 2015-03-23 MED ORDER — SODIUM CHLORIDE 0.9 % IJ SOLN: 3.0000 mL | Freq: Two times a day (BID) | INTRAMUSCULAR | Status: DC

## 2015-03-23 MED ORDER — HEPARIN (PORCINE) IN NACL 100-0.45 UNIT/ML-% IJ SOLN
1550.0000 [IU]/h | INTRAMUSCULAR | Status: DC
  Filled 2015-03-23 (×2): qty 250

## 2015-03-23 MED ORDER — ACETAMINOPHEN 650 MG RE SUPP: 650.0000 mg | Freq: Four times a day (QID) | RECTAL | Status: DC | PRN

## 2015-03-23 MED ORDER — ESCITALOPRAM OXALATE 20 MG PO TABS
20.0000 mg | ORAL_TABLET | Freq: Every day | ORAL | Status: DC
  Administered 2015-03-23: 20 mg via ORAL
  Filled 2015-03-23: qty 1

## 2015-03-23 MED ORDER — BREXPIPRAZOLE 0.5 MG PO TABS: 0.5000 mg | ORAL_TABLET | Freq: Every day | ORAL | Status: DC

## 2015-03-23 MED ORDER — HEPARIN (PORCINE) IN NACL 100-0.45 UNIT/ML-% IJ SOLN
1900.0000 [IU]/h | INTRAMUSCULAR | Status: DC
  Administered 2015-03-23: 1550 [IU]/h via INTRAVENOUS
  Administered 2015-03-23: 1750 [IU]/h via INTRAVENOUS
  Filled 2015-03-23 (×3): qty 250

## 2015-03-23 NOTE — Progress Notes (Signed)
ANTICOAGULATION CONSULT NOTE - Follow-up  Pharmacy Consult for heparin Indication: pulmonary embolus  No Known Allergies  Patient Measurements: Height: 5\' 8"  (172.7 cm) Weight: 260 lb (117.935 kg) IBW/kg (Calculated) : 63.9 Heparin Dosing Weight: 91 kg  Vital Signs: Temp: 98.7 F (37.1 C) (01/11 0704) Temp Source: Oral (01/11 0704) BP: 130/94 mmHg (01/11 1600) Pulse Rate: 110 (01/11 1600)  Labs:  Recent Labs  03/23/15 0727 03/23/15 1240 03/23/15 1632  HGB 13.7  --   --   HCT 42.3  --   --   PLT 274  --   --   APTT  --  61*  --   LABPROT  --  15.2  --   INR  --  1.18  --   HEPARINUNFRC  --   --  0.25*  CREATININE 0.77  --   --     Estimated Creatinine Clearance: 117.3 mL/min (by C-G formula based on Cr of 0.77).   Medical History: Past Medical History  Diagnosis Date  . Depression   . Embolism - blood clot     legs & lung    Assessment: [name] presents on 1/11 with ongoing chest pain for several months. CT is positive for acute PE with R heart strain. Pharmacy consulted to dose heparin. CBC stable, no s/s of bleed  Heparin lvl is subtherapeutic at 0.25, no s/sx of bleeding.  Goal of Therapy:  Heparin level 0.3-0.7 units/ml Monitor platelets by anticoagulation protocol: Yes   Plan:  Increase heparin gtt to 1,750 units/hr Check 6 hr HL Monitor daily HL, CBC, s/s of bleed  Deborah Soto, PharmD Student 03/23/2015 5:11 PM

## 2015-03-23 NOTE — Progress Notes (Signed)
ANTICOAGULATION CONSULT NOTE - Initial Consult  Pharmacy Consult for heparin Indication: pulmonary embolus  No Known Allergies  Patient Measurements: Height: 5\' 8"  (172.7 cm) Weight: 260 lb (117.935 kg) IBW/kg (Calculated) : 63.9 Heparin Dosing Weight: 91 kg  Vital Signs: Temp: 98.7 F (37.1 C) (01/11 0704) Temp Source: Oral (01/11 0704) BP: 118/81 mmHg (01/11 0845) Pulse Rate: 91 (01/11 0845)  Labs:  Recent Labs  03/23/15 0727  HGB 13.7  HCT 42.3  PLT 274  CREATININE 0.77    Estimated Creatinine Clearance: 117.3 mL/min (by C-G formula based on Cr of 0.77).   Medical History: Past Medical History  Diagnosis Date  . Depression   . Embolism - blood clot     legs & lung    Assessment: [name] presents on 1/11 with ongoing chest pain for several months. CT is positive for acute PE with R heart strain. Pharmacy consulted to dose heparin. CBC stable, no s/s of bleed  Goal of Therapy:  Heparin level 0.3-0.7 units/ml Monitor platelets by anticoagulation protocol: Yes   Plan:  Give 4,000 unit heparin BOLUS Start heparin gtt at 1,550 units/hr Check 6 hr HL Monitor daily HL, CBC, s/s of bleed  Deborah Soto, PharmD, [facility] Clinical Pharmacist Pager [phone] 03/23/2015 9:56 AM

## 2015-03-23 NOTE — Progress Notes (Signed)
  Echocardiogram 2D Echocardiogram has been performed.  Tye SavoyCasey N Isahi Godwin 03/23/2015, 5:12 PM

## 2015-03-23 NOTE — ED Notes (Addendum)
Pt reports ongoing chest pain for several months. [facility]. Pt states that pain is under left breast. Pain/SOB is worse with laying and inhaling. Pt has hx of DVT and PE in the past states that this feels the same.

## 2015-03-23 NOTE — Progress Notes (Signed)
FPTS Interim Progress Note  S: Stopped by to check in on patient this evening.  She notes that she continues to breath on RA.  She does endorsed continued sharp L sided chest pain with deep inhalation that is the same as she has had since symptoms started.  She is asking for pain medication for this.  O: BP 140/86 mmHg  Pulse 109  Temp(Src) 99.5 F (37.5 C) (Oral)  Resp 20  Ht 5\' 8"  (1.727 m)  Wt 260 lb 2.3 oz (118 kg)  BMI 39.56 kg/m2  SpO2 100%  LMP 03/04/2015  Gen: awake, alert, well appearing, sitting up in bed Cardio: tachycardic Pulm: breathing normally on room air  A/P: Deborah Soto is a 50 y.o. female admitted for PE.  Stable on room air.  Do not suspect pain is from cardiac source given negative trop/ EKG on admission.  Pain likely from PE. - Continue anticoagulation - Dilaudid 0.5mg  q3 prn severe pain ordered. - Continue Tylenol prn   Raliegh Ipshly M Nissi Doffing, DO 03/23/2015, 10:18 PM PGY-2, [facility] pager [phone]

## 2015-03-23 NOTE — ED Provider Notes (Addendum)
CSN: [id number]     Arrival date & time 03/23/15  4098 History   First MD Initiated Contact with Patient 03/23/15 0746     Chief Complaint  Patient presents with  . Chest Pain  . 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 Gradually worsening. Worse with a deep breath and mild exertional component. No cardiac history. Patient has a stress test planned later this month and saw cardiology recently. Patient had a d-dimer negative recently. No recent surgeries, no active cancer, no unilateral leg swelling.  Mild cough.  Patient is a 50 y.o. female presenting with chest pain and shortness of breath. The history is provided by the patient.  Chest Pain Associated symptoms: cough and shortness of breath   Associated symptoms: no abdominal pain, no back pain, no fever, no headache and not vomiting   Shortness of Breath Associated symptoms: chest pain and cough   Associated symptoms: no abdominal pain, no fever, no headaches, no neck pain, no rash and no vomiting     Past Medical History  Diagnosis Date  . Depression   . Embolism - blood clot     legs & lung   History reviewed. No pertinent past surgical history. Family History  Problem Relation Age of Onset  . Dementia Father    Social History  Substance Use Topics  . Smoking status: Former Smoker -- 1.00 packs/day for 25 years    Quit date: 03/12/2004  . Smokeless tobacco: None  . Alcohol Use: No   OB History    No data available     Review of Systems  Constitutional: Negative for fever and chills.  HENT: Negative for congestion.   Eyes: Negative for visual disturbance.  Respiratory: Positive for cough and shortness of breath.   Cardiovascular:  Positive for chest pain. Negative for leg swelling.  Gastrointestinal: Negative for vomiting and abdominal pain.  Genitourinary: Negative for dysuria and flank pain.  Musculoskeletal: Negative for back pain, neck pain and neck stiffness.  Skin: Negative for rash.  Neurological: Negative for light-headedness and headaches.      Allergies  Review of patient's allergies indicates no known allergies.  Home Medications   Prior to Admission medications   Medication Sig Start Date End Date Taking? Authorizing Provider  Brexpiprazole (REXULTI) 0.5 MG TABS Take 0.5 mg by mouth daily.   Yes Historical Provider, MD  Clindamycin-Benzoyl Per, Refr, gel Apply 1 application topically 2 (two) times daily.  03/10/15  Yes Historical Provider, MD  escitalopram (LEXAPRO) 20 MG tablet Take 20 mg by mouth at bedtime.  03/10/15  Yes Historical Provider, MD  hydrochlorothiazide (MICROZIDE) 12.5 MG capsule Take 1 capsule (12.5 mg total) by mouth daily. 03/14/15  Yes Tonye Pearson, MD  ibuprofen (ADVIL,MOTRIN) 200 MG tablet Take 400 mg by mouth every 6 (six) hours as needed for moderate pain.   Yes Historical Provider, MD   BP 118/81 mmHg  Pulse 91  Temp(Src) 98.7 F (37.1 C) (Oral)  Resp 22  Ht 5\' 8"  (1.727 m)  Wt 260 lb (117.935 kg)  BMI 39.54 kg/m2  SpO2 97%  LMP 03/04/2015 Physical Exam  Constitutional: She is oriented to person, place, and time. She appears well-developed and well-nourished.  HENT:  Head: Normocephalic and atraumatic.  Eyes: Conjunctivae are normal. Right eye exhibits no  discharge. Left eye exhibits no discharge.  Neck: Normal range of motion. Neck supple. No tracheal deviation present.  Cardiovascular: Normal rate and regular rhythm.   Pulmonary/Chest: Effort normal and breath sounds normal.  Abdominal: Soft. She exhibits no distension. There is no tenderness. There is no guarding.  Musculoskeletal: She exhibits no edema.  Neurological: She is alert and oriented to person,  place, and time.  Skin: Skin is warm. No rash noted.  Psychiatric: She has a normal mood and affect.  Nursing note and vitals reviewed.   ED Course  Procedures (including critical care time) CRITICAL CARE Performed by: Enid SkeensZAVITZ, Kassi Esteve M   Total critical care time: 35 minutes  Critical care time was exclusive of separately billable procedures and treating other patients.  Critical care was necessary to treat or prevent imminent or life-threatening deterioration.  Critical care was time spent personally by me on the following activities: development of treatment plan with patient and/or surrogate as well as nursing, discussions with consultants, evaluation of patient's response to treatment, examination of patient, obtaining history from patient or surrogate, ordering and performing treatments and interventions, ordering and review of laboratory studies, ordering and review of radiographic studies, pulse oximetry and re-evaluation of patient's condition.    Labs Review Labs Reviewed  BASIC METABOLIC PANEL - Abnormal; Notable for the following:    Glucose, Bld 273 (*)    All other components within normal limits  CBC  I-STAT TROPOININ, ED    Imaging Review Dg Chest 2 View  03/23/2015  CLINICAL DATA:  Trouble breathing, very short of breath, pain under LEFT breast and into back for 2 weeks, severe this morning since 0430 hours, former smoker EXAM: CHEST  2 VIEW COMPARISON:  03/07/2015 FINDINGS: Normal heart size, mediastinal contours, and pulmonary vascularity. Minimal peribronchial thickening. No pulmonary infiltrate, pleural effusion, or pneumothorax. Bones unremarkable. IMPRESSION: Minimal bronchitic changes without infiltrate. Electronically Signed   By: Ulyses SouthwardMark  Boles M.D.   On: 03/23/2015 08:13   Ct Angio Chest Pe W/cm &/or Wo Cm  03/23/2015  CLINICAL DATA:  Shortness of breath for 4 weeks with chest pain EXAM: CT ANGIOGRAPHY CHEST WITH CONTRAST TECHNIQUE: Multidetector CT imaging of  the chest was performed using the standard protocol during bolus administration of intravenous contrast. Multiplanar CT image reconstructions and MIPs were obtained to evaluate the vascular anatomy. CONTRAST:  100mL OMNIPAQUE IOHEXOL 350 MG/ML SOLN COMPARISON:  None. FINDINGS: The lungs are well aerated bilaterally. No focal infiltrate or sizable effusion is noted. Very mild dependent atelectatic changes are seen. The thoracic inlet is within normal limits. The thoracic aorta in its branches are unremarkable. The pulmonary artery demonstrates a normal branching pattern. There is occlusive pulmonary embolus identified throughout the left lower lobe pulmonary arterial tree. No other focal pulmonary emboli are noted. The RV/LV ratio is 1.2 indicating right heart strain. No hilar or mediastinal adenopathy is noted. The visualized upper abdomen is within normal limits. The osseous structures show no acute abnormality. Review of the MIP images confirms the above findings. IMPRESSION: Positive for acute PE with CT evidence of right heart strain (RV/LV Ratio = 1.2) consistent with at least submassive (intermediate risk) PE. The presence of right heart strain has been associated with an increased risk of morbidity and mortality. Please activate Code PE by paging [phone]. Critical Value/emergent results were called by telephone at the time of interpretation on 03/23/2015 at 9:47 am to Dr. Blane OharaJOSHUA Adaley Kiene , who verbally acknowledged these results. 1 Electronically Signed   By:  Alcide Clever M.D.   On: 03/23/2015 09:48   I have personally reviewed and evaluated these images and lab results as part of my medical decision-making.   EKG Interpretation   Date/Time:  Wednesday March 23 2015 07:00:27 EST Ventricular Rate:  94 PR Interval:  116 QRS Duration: 80 QT Interval:  348 QTC Calculation: 435 R Axis:   34 Text Interpretation:  Normal sinus rhythm Nonspecific T wave abnormality  Abnormal ECG When compared with  ECG of 08/27/2011, Nonspecific T wave  abnormality is now Present Confirmed by [facility]  MD, DAVID ([id number]) on  03/23/2015 7:08:56 AM      MDM   Final diagnoses:  Left sided chest pain   Patient with obesity and family history of blood clots presents with recurrent pain for one month. Reassuring negative d-dimer however with family history and personal history of blood clots plan for CT angiogram for further details. Patient understands risks and benefits of CT scan. Patient has outpatient follow up with cardiology.   Discussed CT results with radiologist, significant left side pulmonary embolism with right heart strain. Heparin infusion ordered, plan for stepdown admission for further observation and workup.  Results and differential diagnosis were discussed with the patient/parent/guardian. Xrays were independently reviewed by myself.  Close follow up outpatient was discussed, comfortable with the plan.   Medications  sodium chloride 0.9 % bolus 500 mL (not administered)  iohexol (OMNIPAQUE) 350 MG/ML injection 100 mL (100 mLs Intravenous Contrast Given 03/23/15 0919)    Filed Vitals:   03/23/15 0813 03/23/15 0815 03/23/15 0830 03/23/15 0845  BP: 139/99 135/95 135/101 118/81  Pulse: 91 95 96 91  Temp:      TempSrc:      Resp: 12 21 13 22   Height:      Weight:      SpO2: 99% 99% 100% 97%    Final diagnoses:  Left sided chest pain       Blane Ohara, MD 03/26/15 0930  Blane Ohara, MD 07/28/20 1150  Blane Ohara, MD 07/28/20 1153

## 2015-03-23 NOTE — Progress Notes (Signed)
Admission note:  Arrival Method: ED stretcher  Mental Orientation: alert & oriented x 4  Telemetry: box applied and CCMD notified  Assessment: in progress   IV: left AC with heparin infusing at 17.5 mL/hr Pain: pt reports left flank and rib pain  Safety Measures: Patient Handbook has been given, and discussed the Fall Prevention worksheet. Left at bedside  Admission: Progressing and admission orders have been written  6E Orientation: Patient has been oriented to the unit, staff and to the room.  Family: At the bedside; husband    Luiz Blareamieko Jenah Vanasten BSN, RN [facility] Phone [phone]

## 2015-03-23 NOTE — ED Notes (Signed)
Verified heparin with Anell BarrKaren Cobb, RN.

## 2015-03-23 NOTE — ED Notes (Signed)
Patient refused IV at this time.   She wants to wait until she talks with MD.

## 2015-03-23 NOTE — ED Notes (Signed)
Rolly SalterHaley, Pharmacist advised that patient's heparin dose can be upped to 1,750 units/hr.

## 2015-03-23 NOTE — H&P (Signed)
[facility] Admission History and Physical Service Pager: [phone]  Patient name: Deborah Soto Medical record number: [id number] Date of birth: 04/22/1965 Age: 50 y.o. Gender: female  Primary Care Provider: Leandrew Koyanagi, MD Consultants: Critical Care Code Status:   Chief Complaint: Shortness of Breath  Assessment and Plan: Deborah Soto is a 50 y.o. female presenting with Acute Left Lower Lobe Submassive PE. PMH is significant for DVT / PE, Chronically anticoagulated in the past, Bipolar Depression, Obesity.   # Acute Submassive PE: With evidence of Right Heart Strain on CTA, Hemodynamically stable. Ongoing SOB for weeks, has had outpatient workup negative to date. Pt. Came to the ED and received a CTA with evidence of significant clot burden in the LLL. 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 Her mom, grandmother, and sisters have all had PE's and are anticoagulated. They have undergone workup at [facility], and [facility] but have been unable to confirm a diagnosis at this time. She has previously been followed by Hematology and was on coumadin for years after having DVT with PE at the age of 88. She came off of Coumadin whenever she got pregnant. Has not been treated in years. Vital signs stable in the ED. Troponin negative, EKG without acute changes. ED to discuss with IR and Critical care regarding the possibility of catheter directed therapy given cardiac compromise.  - Admit to Telemetry Dr. Nori Riis attending.  - Critical care / IR consulted by Dr. Reather Converse (ED) > recs are to monitor for now without catheter directed intervention unless Echo is significantly abnormal or vitals deteriorate.  - Stat Echo for further cardiac evaluation.  - IV Heparin for now given possibility of procedure> likely transition to Novel agent.  - LE Duplex  - CMET - Hypercoagulability laboratory workup: CBC with smear, PT, aPTT,  ESR, INR, Urinalysis, ANA, Protein C, S, Antithrombin, Factor V leiden, APC, aPL, anticardiolipin, B2 Glycoprotein I - some of the above may need to be done as an outpatient due to patient already receiving Heparin prior to lab draw.  - Consider outpatient hematology referral.   - Mammography in 06/2014 Birads 1.  - Hold ASA, Ibuprofen for pain. Only Acetaminophen.    Bipolar Depression: Pt. Just recently switched on to current medications. Depression is stable.  - Continue Rexulti - Continue Lexapro.   HTN - Within range, hemodynamically stable here.  - Continue HCTZ 12.42m.    FEN/GI: Heart Healthy Diet, PO hydration.  Prophylaxis: Heparin Iv.   Disposition: pending further workup.   History of Present Illness:  Deborah Soto a 50y.o. female with PMH of DVT at 50y/o, presenting with shortness of breath for over 3 weeks and to some degree for the past 2 months. She says that she has had shortness of breath regardless of position, worse with exertion and laying flat. She has had LE edema as well. She complains of chest tightness "like a ton of bricks on my chest", and pain with inspiration on the left side of her chest. No pain in her arm, neck, or jaw, no nausea. She denies fever, chills, or productive cough. She has not had any constipation, diarrhea, weight loss, melena, or hematochezia. No recent long trips, flights, surgeries, or injuries to her lower extremities. Mammogram is up to date in the last year and is negative, no night sweats. She does have malaise and fatigue though hard to say if this is related to her  depression or current illness. Significantly, she does have a strong family history of DVT and PE. Her mother, two sisters, and grandmother all have had problems with DVT and PE. They have undergone extensive workup without diagnosis to date per the patient. She says that she had a DVT and PE at 50 years old. She was worked up by Hematology at that time without diagnosis  and placed on coumadin, but had to stop coumadin whenever she got pregnant. She did not go back on Coumadin after her children were born. She has not seen Heme in many years. She does not drink, she was a former smoker and quit 7 years ago, no other substances.   Review Of Systems: Per HPI with the following additions: None.  Otherwise the remainder of the systems were negative.  Patient Active Problem List   Diagnosis Date Noted  . Acute pulmonary embolism ([place]) 03/23/2015  . Achilles tendonitis 04/08/2012  . 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 PTE (pulmonary thromboembolism) ([place]) 10/10/2011  . Preeclampsia 10/10/2011  . Diverticulitis 10/10/2011  . Bipolar affective disorder ([place]) 10/10/2011  . BMI 34.0-34.9,adult 10/10/2011  . AR (allergic rhinitis) 10/10/2011    Past Medical History: Past Medical History  Diagnosis Date  . Depression   . Embolism - blood clot     legs & lung    Past Surgical History: History reviewed. No pertinent past surgical history.  Social History: Social History  Substance Use Topics  . Smoking status: Former Smoker -- 1.00 packs/day for 25 years    Quit date: 03/12/2004  . Smokeless tobacco: None  . Alcohol Use: No   Additional social history:  None.  Please also refer to relevant sections of EMR.  Family History: Family History  Problem Relation Age of Onset  . Dementia Father    (If not completed, MUST add something in) Mother, Grandmother, and two sisters with hx of DVT, PE.   Allergies and Medications: No Known Allergies No current facility-administered medications on file prior to encounter.   Current Outpatient Prescriptions on File Prior to Encounter  Medication Sig Dispense Refill  . hydrochlorothiazide (MICROZIDE) 12.5 MG capsule Take 1 capsule (12.5 mg total) by mouth daily. 30 capsule 0    Objective: BP 133/86 mmHg  Pulse 88  Temp(Src) 98.7 F (37.1 C) (Oral)  Resp 17  Ht _0  (1.727 m)  Wt 260 lb (117.935 kg)  BMI  39.54 kg/m2  SpO2 99%  LMP 03/04/2015 Exam: General: NAD, Resting comfortably.  Eyes: EOMI, PERRLA.  ENTM: Nares patent, O/P clear Neck: No LAD.  Cardiovascular: RRR, No murmurs or gallops audible. No heaves,  2+ distal pulses.  Respiratory: Clear in all lung fields bilaterally, limited inspiration due to pain. Appropriate rate, unlabored. Oxygen saturation appropriate on room air.  Abdomen: S, NT, ND, +BS, no organomegaly palpable, no masses.  MSK: Moves all extremities well, no edema of lower extremities, No calf tenderness, no erythema.  Skin: No rashes, no lesions.  Neuro: Grossly in tact. AAOx3 Psych: Appropriate mood and affect at this time.   Labs and Imaging: CBC BMET   Recent Labs Lab 03/23/15 0727  WBC 9.3  HGB 13.7  HCT 42.3  PLT 274    Recent Labs Lab 03/23/15 0727  NA 138  K 3.6  CL 102  CO2 23  BUN 10  CREATININE 0.77  GLUCOSE 273*  CALCIUM 9.5     Troponin - 0.01 EKG - changes consistent with PE, p-pulmonale.   CT Angio 03/22/2014.  IMPRESSION: Positive for acute PE with CT evidence of right heart strain (RV/LV Ratio = 1.2) consistent with at least submassive (intermediate risk) PE. The presence of right heart strain has been associated with an increased risk of morbidity and mortality. Please activate Code PE by paging [phone].  Critical Value/emergent results were called by telephone at the time of interpretation on 03/23/2015 at 9:47 am to Dr. Elnora Morrison , who verbally acknowledged these results. 1   Electronically Signed  By: Inez Catalina M.D.  On: 03/23/2015 09:48  Aquilla Hacker, MD 03/23/2015, 11:07 AM PGY-2, [place] Intern pager: [phone], text pages welcome

## 2015-03-24 ENCOUNTER — Ambulatory Visit (HOSPITAL_COMMUNITY): Payer: 59

## 2015-03-24 DIAGNOSIS — I2699 Other pulmonary embolism without acute cor pulmonale: Secondary | ICD-10-CM

## 2015-03-24 LAB — CBC
HCT: 39.2 % (ref 36.0–46.0)
Hemoglobin: 12.7 g/dL (ref 12.0–15.0)
MCH: 29.1 pg (ref 26.0–34.0)
MCHC: 32.4 g/dL (ref 30.0–36.0)
MCV: 89.7 fL (ref 78.0–100.0)
Platelets: 266 10*3/uL (ref 150–400)
RBC: 4.37 MIL/uL (ref 3.87–5.11)
RDW: 14.5 % (ref 11.5–15.5)
WBC: 10.4 10*3/uL (ref 4.0–10.5)

## 2015-03-24 LAB — COMPREHENSIVE METABOLIC PANEL
ALT: 19 U/L (ref 14–54)
AST: 20 U/L (ref 15–41)
Albumin: 3.2 g/dL — ABNORMAL LOW (ref 3.5–5.0)
Alkaline Phosphatase: 49 U/L (ref 38–126)
Anion gap: 10 (ref 5–15)
BUN: 13 mg/dL (ref 6–20)
CO2: 26 mmol/L (ref 22–32)
Calcium: 8.8 mg/dL — ABNORMAL LOW (ref 8.9–10.3)
Chloride: 105 mmol/L (ref 101–111)
Creatinine, Ser: 0.67 mg/dL (ref 0.44–1.00)
GFR calc Af Amer: >60 mL/min (ref >60)
GFR calc non Af Amer: >60 mL/min (ref >60)
Glucose, Bld: 170 mg/dL — ABNORMAL HIGH (ref 65–99)
Potassium: 3.7 mmol/L (ref 3.5–5.1)
Sodium: 141 mmol/L (ref 135–145)
Total Bilirubin: 0.3 mg/dL (ref 0.3–1.2)
Total Protein: 5.7 g/dL — ABNORMAL LOW (ref 6.5–8.1)

## 2015-03-24 LAB — ANTINUCLEAR ANTIBODIES, IFA: ANA Ab, IFA: POSITIVE — AB

## 2015-03-24 LAB — URINALYSIS, ROUTINE W REFLEX MICROSCOPIC
Bilirubin Urine: NEGATIVE
Glucose, UA: 100 mg/dL — AB
Hgb urine dipstick: NEGATIVE
Ketones, ur: NEGATIVE mg/dL
Leukocytes, UA: NEGATIVE
Nitrite: NEGATIVE
Protein, ur: NEGATIVE mg/dL
Specific Gravity, Urine: 1.028 (ref 1.005–1.030)
pH: 5 (ref 5.0–8.0)

## 2015-03-24 LAB — FANA STAINING PATTERNS
Homogeneous Pattern: 1:80 {titer}
Speckled Pattern: 1:80 {titer}

## 2015-03-24 LAB — HEPARIN LEVEL (UNFRACTIONATED)
Heparin Unfractionated: 0.31 IU/mL (ref 0.30–0.70)
Heparin Unfractionated: 0.36 IU/mL (ref 0.30–0.70)

## 2015-03-24 LAB — PROTIME-INR
INR: 1.16 (ref 0.00–1.49)
Prothrombin Time: 15 seconds (ref 11.6–15.2)

## 2015-03-24 LAB — TROPONIN I
Troponin I: <0.03 ng/mL (ref <0.031)
Troponin I: <0.03 ng/mL (ref <0.031)

## 2015-03-24 MED ORDER — RIVAROXABAN 20 MG PO TABS: 20.0000 mg | ORAL_TABLET | Freq: Every day | ORAL | Status: DC

## 2015-03-24 MED ORDER — BREXPIPRAZOLE 1 MG PO TABS
1.0000 mg | ORAL_TABLET | Freq: Every day | ORAL | Status: DC
  Administered 2015-03-24: 1 mg via ORAL

## 2015-03-24 MED ORDER — RIVAROXABAN 15 MG PO TABS: 15.0000 mg | ORAL_TABLET | Freq: Two times a day (BID) | ORAL | Status: DC

## 2015-03-24 MED ORDER — ACETAMINOPHEN 325 MG PO TABS: 650.0000 mg | ORAL_TABLET | Freq: Four times a day (QID) | ORAL | Status: DC | PRN

## 2015-03-24 MED ORDER — RIVAROXABAN 15 MG PO TABS
15.0000 mg | ORAL_TABLET | Freq: Two times a day (BID) | ORAL | Status: DC
  Administered 2015-03-24: 15 mg via ORAL
  Filled 2015-03-24: qty 1

## 2015-03-24 MED ORDER — OXYCODONE HCL 5 MG PO TABS: 5.0000 mg | ORAL_TABLET | Freq: Four times a day (QID) | ORAL | Status: DC | PRN

## 2015-03-24 NOTE — Discharge Summary (Signed)
Family Medicine Teaching [facility] Discharge Summary  Patient name: Deborah Soto Medical record number: [id number] Date of birth: 06-08-1965 Age: 50 y.o. Gender: female Date of Admission: 03/23/2015  Date of Discharge: 03/24/2015 Admitting Physician: Nestor Ramp, MD  Primary Care Provider: Tonye Pearson, MD Consultants: none  Indication for Hospitalization: PE  Discharge Diagnoses/Problem List:  PE, ?hypertension, bipolar disorder,   Disposition: home  Discharge Condition: stable  Discharge Exam: Temp: [98.4 F (36.9 C)-99.5 F (37.5 C)] 98.4 F (36.9 C) (01/12 0500) Pulse Rate: [64-112] 94 (01/12 0500) Resp: [10-24] 18 (01/12 0500) BP: (109-140)/(63-101) 119/82 mmHg (01/12 0500) SpO2: [94 %-100 %] 97 % (01/12 0500) Weight: [260 lb 2.3 oz (118 kg)] 260 lb 2.3 oz (118 kg) (01/11 1821) Physical Exam: Gen: appears obese, sitting in bed eating breakfast Neck: supple, no LAD, no JVD or hepatojugular reflux CV: ?PVCs, S1 & S2 audible, no murmurs. Resp: no apparent work of breathing, clear to auscultation bilaterally. GI: bowel sounds normal, no tenderness to palpation GU: no suprapubic tenderness Skin: no lesion  Brief Hospital Course:  Deborah Soto is a 50 y.o. female presenting with gradually worsening shortness of breath for two months, and found to have submassive PE. PMH is significant for DVT / PE (Chronically anticoagulated in the past) Bipolar Depression and Obesity. Patient has significant family history of PE (mother, grandmother, and two sisters). They had extensive work up at [facility] and [facility] but couldn't identify the cause. Patient has previously been followed by Hematology and was on coumadin for years after having DVT with PE at the age of 70. She came off of Coumadin whenever she got pregnant. She has not been treated in years.  Acute Submassive PE: CTA showed right heart strain and significant clot burden in the LLL. Troponin negative, EKG with  t-wave changes. Critical care and interventional radiology consulted and didn't feel she needs catheter directed therapy as she was hemodynamically stable. She was started on heparin drip. Echo normal. Lower extremity duplex negative for DVT.  On the day of discharge patient was stable sating in upper 90's to 100 on RA. Shortness of breath and chest pain improved. She was discharged on Xarelto 15 mg twice a day for 21 days, then 20 mg daily. We have advised the patient to avoid over the counter pain medications except plain tylenol. She was also given return precautions.   HTN - BP within normal range. She was started on HCTZ recently for shortness of breath. No that the cause of her Dyspnea is known, we didn't feel she need to continue taking this. We recommend checking her BP at follow up.  Other chronic conditions stable.  Issues for Follow Up:  1. PE: patient stable at time of discharge. Assess improvement in pain and dyspnea. Recommend work up in future when she can be taken of Xarelto. 2. Hypertension: normotensive while in house. Stopped her HCTZ on discharge. She was initially given this prescription for dyspnea out of concern for fluid overload.   Significant Procedures: none  Significant Labs and Imaging:   Recent Labs Lab 03/23/15 0727 03/24/15 0600  WBC 9.3 10.4  HGB 13.7 12.7  HCT 42.3 39.2  PLT 274 266    Recent Labs Lab 03/23/15 0727 03/24/15 0600  NA 138 141  K 3.6 3.7  CL 102 105  CO2 23 26  GLUCOSE 273* 170*  BUN 10 13  CREATININE 0.77 0.67  CALCIUM 9.5 8.8*  ALKPHOS  --  49  AST  --  20  ALT  --  19  ALBUMIN  --  3.2*    Results/Tests Pending at Time of Discharge: ANA, Lupus anticoagulant panel  Discharge Medications:    Medication List    STOP taking these medications        hydrochlorothiazide 12.5 MG capsule  Commonly known as:  MICROZIDE     ibuprofen 200 MG tablet  Commonly known as:  ADVIL,MOTRIN      TAKE these medications         acetaminophen 325 MG tablet  Commonly known as:  TYLENOL  Take 2 tablets (650 mg total) by mouth every 6 (six) hours as needed for mild pain (or Fever >/= 101).     Clindamycin-Benzoyl Per (Refr) gel  Apply 1 application topically 2 (two) times daily.     escitalopram 20 MG tablet  Commonly known as:  LEXAPRO  Take 20 mg by mouth at bedtime.     oxyCODONE 5 MG immediate release tablet  Commonly known as:  ROXICODONE  Take 1 tablet (5 mg total) by mouth every 6 (six) hours as needed for severe pain.     REXULTI 0.5 MG Tabs  Generic drug:  Brexpiprazole  Take 1 mg by mouth daily.     Rivaroxaban 15 MG Tabs tablet  Commonly known as:  XARELTO  Take 1 tablet (15 mg total) by mouth 2 (two) times daily with a meal.     rivaroxaban 20 MG Tabs tablet  Commonly known as:  XARELTO  Take 1 tablet (20 mg total) by mouth daily with supper. Start on 22nd day        Discharge Instructions: Please refer to Patient Instructions section of EMR for full details.  Patient was counseled important signs and symptoms that should prompt return to medical care, changes in medications, dietary instructions, activity restrictions, and follow up appointments.   Follow-Up Appointments: Follow-up Information    Follow up with DOOLITTLE, Harrel LemonOBERT P, MD. Call in 3 days.   Specialties:  Internal Medicine, Adolescent Medicine   Contact information:   [address] [place] [address] [phone]       Almon Herculesaye T Padraic Marinos, MD 03/24/2015, 10:37 PM PGY-1, [facility]

## 2015-03-24 NOTE — Discharge Instructions (Addendum)
It has been a pleasure taking care of you! You were admitted due to shortness of breath and chest pain due to clots in your lungs. This is medically know [name] pulmonary embolism. After the tests we have done, we are not sure about the origin of this clot. We have treated your with blood thinners, and your symptoms improved.  We are discharging on blood thinner medication that you can take at home. It is strongly recommended that you avoid over the counter pain medications except palin tylenol. We may have also made some adjustments to your other medications. Please, make sure to read the directions before you take them. The names and directions on how to take these medications are found on this discharge paper under medication section.  You also need a follow up with your primary care doctor.   Please return or call 911 if you feel worsening short of breath or chest pain.   Below, you can find some reading about pulmonary embolism  Take care,   Pulmonary Embolism A pulmonary embolism (PE) is a sudden blockage or decrease of blood flow in one lung or both lungs. Most blockages come from a blood clot that travels from the legs or the pelvis to the lungs. PE is a dangerous and potentially life-threatening condition if it is not treated right away. CAUSES A pulmonary embolism occurs most commonly when a blood clot travels from one of your veins to your lungs. Rarely, PE is caused by air, fat, amniotic fluid, or part of a tumor traveling through your veins to your lungs. RISK FACTORS A PE is more likely to develop in:  People who smoke.  People who areolder, especially over 34 years of age.  People who are overweight (obese).  People who sit or lie still for a long time, such as during long-distance travel (over 4 hours), bed rest, hospitalization, or during recovery from certain medical conditions like a stroke.  People who do not engage in much physical activity (sedentary  lifestyle).  People who have chronic breathing disorders.  People whohave a personal or family history of blood clots or blood clotting disease.  People whohave peripheral vascular disease (PVD), diabetes, or some types of cancer.  People who haveheart disease, especially if the person had a recent heart attack or has congestive heart failure.  People who have neurological diseases that affect the legs (leg paresis).  People who have had a traumatic injury, such as breaking a hip or leg.  People whohave recently had major or lengthy surgery, especially on the hip, knee, or abdomen.  People who have hada central line placed inside a large vein.  People who takemedicines that contain the hormone estrogen. These include birth control pills and hormone replacement therapy.  Pregnancy or during childbirth or the postpartum period. SIGNS AND SYMPTOMS  The symptoms of a PE usually start suddenly and include:  Shortness of breath while active or at rest.  Coughing or coughing up blood or blood-tinged mucus.  Chest pain that is often worse with deep breaths.  Rapid or irregular heartbeat.  Feeling light-headed or dizzy.  Fainting.  Feelinganxious.  Sweating. There may also be pain and swelling in a leg if that is where the blood clot started. These symptoms may represent a serious problem that is an emergency. Do not wait to see if the symptoms will go away. Get medical help right away. Call your local emergency services (911 in the [place] Do not drive yourself to the hospital.  DIAGNOSIS Your health care provider will take a medical history and perform a physical exam. You may also have other tests, including:  Blood tests to assess the clotting properties of your blood, assess oxygen levels in your blood, and find blood clots.  Imaging tests, such as CT, ultrasound, MRI, X-ray, and other tests to see if you have clots anywhere in your body.  An electrocardiogram (ECG)  to look for heart strain from blood clots in the lungs. TREATMENT The main goals of PE treatment are:  To stop a blood clot from growing larger.  To stop new blood clots from forming. The type of treatment that you receive depends on many factors, such as the cause of your PE, your risk for bleeding or developing more clots, and other medical conditions that you have. Sometimes, a combination of treatments is necessary. This condition may be treated with:  Medicines, including newer oral blood thinners (anticoagulants), warfarin, low molecular weight heparins, thrombolytics, or heparins.  Wearing compression stockings or using different types of devices.  Surgery (rare) to remove the blood clot or to place a filter in your abdomen to stop the blood clot from traveling to your lungs. Treatments for a PE are often divided into immediate treatment, long-term treatment (up to 3 months after PE), and extended treatment (more than 3 months after PE). Your treatment may continue for several months. This is called maintenance therapy, and it is used to prevent the forming of new blood clots. You can work with your health care provider to choose the treatment program that is best for you. What are anticoagulants? Anticoagulants are medicines that treat PEs. They can stop current blood clots from growing and stop new clots from forming. They cannot dissolve existing clots. Your body dissolves clots by itself over time. Anticoagulants are given by mouth, by injection, or through an IV tube. What are thrombolytics? Thrombolytics are clot-dissolving medicines that are used to dissolve a PE. They carry a high risk of bleeding, so they tend to be used only in severe cases or if you have very low blood pressure. HOME CARE INSTRUCTIONS If you are taking a newer oral anticoagulant:  Take the medicine every single day at the same time each day.  Understand what foods and drugs interact with this  medicine.  Understand that there are no regular blood tests required when using this medicine.  Understandthe side effects of this medicine, including excessive bruising or bleeding. Ask your health care provider or pharmacist about other possible side effects. If you are taking warfarin:  Understand how to take warfarin and know which foods can affect how warfarin works in [employer] relations account executive.  Understand that it is dangerous to taketoo much or too little warfarin. Too much warfarin increases the risk of bleeding. Too little warfarin continues to allow the risk for blood clots.  Follow your PT and INR blood testing schedule. The PT and INR results allow your health care provider to adjust your dose of warfarin. It is very important that you have your PT and INR tested as often as told by your health care provider.  Avoid major changes in your diet, or tell your health care provider before you change your diet. Arrange a visit with a registered dietitian to answer your questions. Many foods, especially foods that are high in vitamin K, can interfere with warfarin and affect the PT and INR results. Eat a consistent amount of foods that are high in vitamin K, such as:  Spinach,  kale, broccoli, cabbage, collard greens, turnip greens, Brussels sprouts, peas, cauliflower, seaweed, and parsley.  Beef liver and pork liver.  Green tea.  Soybean oil.  Tell your health care provider about any and all medicines, vitamins, and supplements that you take, including aspirin and other over-the-counter anti-inflammatory medicines. Be especially cautious with aspirin and anti-inflammatory medicines. Do not take those before you ask your health care provider if it is safe to do so. This is important because many medicines can interfere with warfarin and affect the PT and INR results.  Do not start or stop taking any over-the-counter or prescription medicine unless your health care provider or pharmacist tells you to  do so. If you take warfarin, you will also need to do these things:  Hold pressure over cuts for longer than usual.  Tell your dentist and other health care providers that you are taking warfarin before you have any procedures in which bleeding may occur.  Avoid alcohol or drink very small amounts. Tell your health care provider if you change your alcohol intake.  Do not use tobacco products, including cigarettes, chewing tobacco, and e-cigarettes. If you need help quitting, ask your health care provider.  Avoid contact sports. General Instructions  Take over-the-counter and prescription medicines only as told by your health care provider. Anticoagulant medicines can have side effects, including easy bruising and difficulty stopping bleeding. If you are prescribed an anticoagulant, you will also need to do these things:  Hold pressure over cuts for longer than usual.  Tell your dentist and other health care providers that you are taking anticoagulants before you have any procedures in which bleeding may occur.  Avoid contact sports.  Wear a medical alert bracelet or carry a medical alert card that says you have had a PE.  Ask your health care provider how soon you can go back to your normal activities. Stay active to prevent new blood clots from forming.  Make sure to exercise while traveling or when you have been sitting or standing for a long period of time. It is very important to exercise. Exercise your legs by walking or by tightening and relaxing your leg muscles often. Take frequent walks.  Wear compression stockings as told by your health care provider to help prevent more blood clots from forming.  Do not use tobacco products, including cigarettes, chewing tobacco, and e-cigarettes. If you need help quitting, ask your health care provider.  Keep all follow-up appointments with your health care provider. This is important. PREVENTION Take these actions to decrease your risk  of developing another PE:  Exercise regularly. For at least 30 minutes every day, engage in:  Activity that involves moving your arms and legs.  Activity that encourages good blood flow through your body by increasing your heart rate.  Exercise your arms and legs every hour during long-distance travel (over 4 hours). Drink plenty of water and avoid drinking alcohol while traveling.  Avoid sitting or lying in bed for long periods of time without moving your legs.  Maintain a weight that is appropriate for your height. Ask your health care provider what weight is healthy for you.  If you are a woman who is over 64 years of age, avoid unnecessary use of medicines that contain estrogen. These include birth control pills.  Do not smoke, especially if you take estrogen medicines. If you need help quitting, ask your health care provider.  If you are at very high risk for PE, wear compression stockings.  If you recently had a PE, have regularly scheduled ultrasound testing on your legs to check for new blood clots. If you are hospitalized, prevention measures may include:  Early walking after surgery, as soon as your health care provider says that it is safe.  Receiving anticoagulants to prevent blood clots. If you cannot take anticoagulants, other options may be available, such as wearing compression stockings or using different types of devices. SEEK IMMEDIATE MEDICAL CARE IF:  You have new or increased pain, swelling, or redness in an arm or leg.  You have numbness or tingling in an arm or leg.  You have shortness of breath while active or at rest.  You have chest pain.  You have a rapid or irregular heartbeat.  You feel light-headed or dizzy.  You cough up blood.  You notice blood in your vomit, bowel movement, or urine.  You have a fever. These symptoms may represent a serious problem that is an emergency. Do not wait to see if the symptoms will go away. Get medical help  right away. Call your local emergency services (911 in the [place] Do not drive yourself to the hospital.   This information is not intended to replace advice given to you by your health care provider. Make sure you discuss any questions you have with your health care provider.   Document Released: 02/24/2000 Document Revised: 11/17/2014 Document Reviewed: 06/23/2014 Elsevier Interactive Patient Education 2016 [employer].  Information on my medicine - XARELTO (rivaroxaban)  This medication education was reviewed with me or my healthcare representative as part of my discharge preparation.  The pharmacist that spoke with me during my hospital stay was:  Elwin Sleight, [facility]  WHY WAS Carlena Hurl PRESCRIBED FOR YOU? Xarelto was prescribed to treat blood clots that may have been found in the veins of your legs (deep vein thrombosis) or in your lungs (pulmonary embolism) and to reduce the risk of them occurring again.  What do you need to know about Xarelto? The starting dose is one 15 mg tablet taken TWICE daily with food for the FIRST 21 DAYS then  the dose is changed to one 20 mg tablet taken ONCE A DAY with your evening meal.  DO NOT stop taking Xarelto without talking to the health care provider who prescribed the medication.  Refill your prescription for 20 mg tablets before you run out.  After discharge, you should have regular check-up appointments with your healthcare provider that is prescribing your Xarelto.  In the future your dose may need to be changed if your kidney function changes by a significant amount.  What do you do if you miss a dose? If you are taking Xarelto TWICE DAILY and you miss a dose, take it as soon as you remember. You may take two 15 mg tablets (total 30 mg) at the same time then resume your regularly scheduled 15 mg twice daily the next day.  If you are taking Xarelto ONCE DAILY and you miss a dose, take it as soon as you remember on the same day then  continue your regularly scheduled once daily regimen the next day. Do not take two doses of Xarelto at the same time.   Important Safety Information Xarelto is a blood thinner medicine that can cause bleeding. You should call your healthcare provider right away if you experience any of the following: ? Bleeding from an injury or your nose that does not stop. ? Unusual colored urine (red or dark brown) or  unusual colored stools (red or black). ? Unusual bruising for unknown reasons. ? A serious fall or if you hit your head (even if there is no bleeding).  Some medicines may interact with Xarelto and might increase your risk of bleeding while on Xarelto. To help avoid this, consult your healthcare provider or pharmacist prior to using any new prescription or non-prescription medications, including herbals, vitamins, non-steroidal anti-inflammatory drugs (NSAIDs) and supplements.  This website has more information on Xarelto: [email or web address].xarelto.[email or web address].

## 2015-03-24 NOTE — Progress Notes (Signed)
Discharge instructions and medications discussed with patient.  Home medication returned to patient.  Prescriptions given to patient.  All questions answered.

## 2015-03-24 NOTE — Progress Notes (Signed)
ANTICOAGULATION CONSULT NOTE - Follow-up  Pharmacy Consult for heparin Indication: pulmonary embolus  No Known Allergies  Patient Measurements: Height: 5\' 8"  (172.7 cm) Weight: 260 lb 2.3 oz (118 kg) IBW/kg (Calculated) : 63.9 Heparin Dosing Weight: 91 kg  Vital Signs: Temp: 99.5 F (37.5 C) (01/11 2037) Temp Source: Oral (01/11 2037) BP: 140/86 mmHg (01/11 2037) Pulse Rate: 109 (01/11 2037)  Labs:  Recent Labs  03/23/15 0727 03/23/15 1240 03/23/15 1632 03/23/15 2315  HGB 13.7  --   --   --   HCT 42.3  --   --   --   PLT 274  --   --   --   APTT  --  61*  --   --   LABPROT  --  15.2  --   --   INR  --  1.18  --   --   HEPARINUNFRC  --   --  0.25* 0.31  CREATININE 0.77  --   --   --     Estimated Creatinine Clearance: 117.3 mL/min (by C-G formula based on Cr of 0.77).  Assessment: [name] presents on 1/11 with ongoing chest pain for several months. Pt on heparin for acute PE with R heart strain. Heparin level therapeutic but at low end of therapeutic range (0.31) with PE and R heart strain would prefer to be at upper end of therapeutic range. No bleeding noted.  Goal of Therapy:  Heparin level 0.3-0.7 units/ml Monitor platelets by anticoagulation protocol: Yes   Plan:  Increase heparin gtt to 1900 units/hr Check 6 hr HL  Christoper Fabianaron Nicholaos Schippers, PharmD, BCPS Clinical pharmacist, pager [phone] 03/24/2015 12:23 AM

## 2015-03-24 NOTE — Progress Notes (Signed)
Utilization review completed. Quentez Lober, RN, BSN. 

## 2015-03-24 NOTE — Progress Notes (Signed)
Family Medicine Teaching Service Daily Progress Note Intern Pager: [phone]  Patient name: Deborah Soto Medical record number: [id number] Date of birth: 1965-08-21 Age: 50 y.o. Gender: female  Primary Care Provider: Tonye PearsonOLITTLE, ROBERT P, MD Consultants: none Code Status: full  Pt Overview and Major Events to Date:  01/11-admitted with submassive PE  Assessment and Plan: Deborah BoardMichelle Micciche is a 50 y.o. female presenting with Acute Left Lower Lobe Submassive PE. PMH is significant for DVT / PE, Chronically anticoagulated in the past, Bipolar Depression, Obesity.   Acute Submassive PE: CTA with right heart strain and significant clot burden in the LLL. 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 She has previously been followed by Hematology and was on coumadin for years after having DVT with PE at the age of 50 until she got pregnant. Troponin negative, EKG without acute changes. Echo normal. LE duplex negative for DVT. No catheter directed therapy per IR.  - IV Heparin now. Consider switching to Xarelto given normal echo and patient stable - Hypercoagulability workup: ANA and lupus anticoagulant panel pending - Hold NSAIDs. Only Acetaminophen.   Bipolar Depression: stable.  - Continue home Rexulti and Lexapro.   HTN - Within range, hemodynamically stable here.  - Continue HCTZ 12.5mg .   FEN/GI:  -Heart Healthy Diet,   Prophylaxis: on Heparin gtt for PE  Disposision: patient stable overnight. She can go home on Xarelto per PE protocol   Subjective:  Says her breathing and chest tightness are better. Reports stabbing chest pain under her breast radiating to her back. Worse with deep breathing. No hemoptysis.  Objective: Temp:  [98.4 F (36.9 C)-99.5 F (37.5 C)] 98.4 F (36.9 C) (01/12 0500) Pulse Rate:  [64-112] 94 (01/12 0500) Resp:  [10-24] 18 (01/12 0500) BP: (109-140)/(63-101) 119/82 mmHg (01/12 0500) SpO2:  [94 %-100  %] 97 % (01/12 0500) Weight:  [260 lb 2.3 oz (118 kg)] 260 lb 2.3 oz (118 kg) (01/11 1821) Physical Exam: Gen: appears obese, sitting in bed eating breakfast Neck: supple, no LAD, no JVD or hepatojugular reflux CV: ?PVC, S1 & S2 audible, no murmurs. Resp: no apparent work of breathing, clear to auscultation bilaterally. GI: bowel sounds normal, no tenderness to palpation GU: no suprapubic tenderness Skin: no lesion  Laboratory:  Recent Labs Lab 03/23/15 0727 03/24/15 0600  WBC 9.3 10.4  HGB 13.7 12.7  HCT 42.3 39.2  PLT 274 266    Recent Labs Lab 03/23/15 0727 03/24/15 0600  NA 138 141  K 3.6 3.7  CL 102 105  CO2 23 26  BUN 10 13  CREATININE 0.77 0.67  CALCIUM 9.5 8.8*  PROT  --  5.7*  BILITOT  --  0.3  ALKPHOS  --  49  ALT  --  19  AST  --  20  GLUCOSE 273* 170*    Imaging/Diagnostic Tests:No results found.  Almon Herculesaye T Danicka Hourihan, MD 03/24/2015, 8:15 AM PGY-1, [facility] FPTS Intern pager: [phone], text pages welcome

## 2015-03-24 NOTE — Progress Notes (Signed)
During bedside reporting noticed that IV pump was turned off.  Patient stated that she turned it off because it was beeping and said infusion complete.  Patient could not give the time she turned the pump off.  Pharmacy notified.  Patient educated not to turn off machine and to call the RN on her room phone.  RN number located on the white board in patient's room.

## 2015-03-24 NOTE — Care Management Note (Addendum)
Case Management Note  Patient Details  Name: Deborah Soto MRN: [id number] Date of Birth: 1966-01-09  Subjective/Objective:         CM following for progression and d/c planning.           Action/Plan: 03/24/2015 Noted CM consult for PCP for this pt. This Pt has Pharmacist, community, therefore she has a number to call to obtains the names of PCPs accepting her insurance, the choice is her's. Selecting a PCP for this pt is not appropriate for CM. Will discuss with pt. Generally at the retirement of a physician referrals are made by the physicain's office.  03/24/2015 Met with pt and explained process for setting up a new PCP, she has been active with [facility] and while she has been referred to another MD in that practice she would like a PCP in private practice rather than the urgent care environment. Again the pt was advised to call the number on her insurance care for advice on doctors accepting her insurance.  Pt was given Xarelto card for 30 day free trial and zero copay, pt RN called MD for prescriptions as call in prescriptions do not honor these cards.  Expected Discharge Date:    03/24/2015              Expected Discharge Plan:   Home with self care  In-House Referral:     Discharge planning Services  CM Consult  Post Acute Care Choice:    Choice offered to:     DME Arranged:   NA DME Agency:   NA  HH Arranged:   NA HH Agency:   NA  Status of Service:  Complete.  Medicare Important Message Given:    Date Medicare IM Given:    Medicare IM give by:    Date Additional Medicare IM Given:    Additional Medicare Important Message give by:     If discussed at [place] of Stay Meetings, dates discussed:    Additional Comments:  Adron Bene, RN 03/24/2015, 8:15 AM

## 2015-03-24 NOTE — Progress Notes (Signed)
VASCULAR LAB PRELIMINARY  PRELIMINARY  PRELIMINARY  PRELIMINARY  Bilateral lower extremity venous duplex completed.    Preliminary report:  There is no DVT or SVT noted in the bilateral lower extremities.   Jadin Kagel, RVT 03/24/2015, 9:00 AM

## 2015-03-28 LAB — PTT-LA MIX: PTT-LA Mix: 70.7 s — ABNORMAL HIGH (ref 0.0–40.6)

## 2015-03-28 LAB — DRVVT MIX: dRVVT Mix: 40.8 s (ref 0.0–44.0)

## 2015-03-28 LAB — LUPUS ANTICOAGULANT PANEL
DRVVT: 46.6 s — ABNORMAL HIGH (ref 0.0–44.0)
PTT Lupus Anticoagulant: 75.5 s — ABNORMAL HIGH (ref 0.0–40.6)

## 2015-03-28 LAB — HEXAGONAL PHASE PHOSPHOLIPID: Hexagonal Phase Phospholipid: 1 s (ref 0–11)

## 2015-03-31 ENCOUNTER — Ambulatory Visit (INDEPENDENT_AMBULATORY_CARE_PROVIDER_SITE_OTHER): Payer: 59

## 2015-03-31 ENCOUNTER — Ambulatory Visit (INDEPENDENT_AMBULATORY_CARE_PROVIDER_SITE_OTHER): Payer: 59 | Admitting: Family Medicine

## 2015-03-31 VITALS — BP 134/90 | HR 99 | Temp 98.4°F | Resp 18 | Ht 68.0 in | Wt 269.0 lb

## 2015-03-31 DIAGNOSIS — Z86711 Personal history of pulmonary embolism: Secondary | ICD-10-CM

## 2015-03-31 DIAGNOSIS — R0789 Other chest pain: Secondary | ICD-10-CM

## 2015-03-31 DIAGNOSIS — R0602 Shortness of breath: Secondary | ICD-10-CM

## 2015-03-31 DIAGNOSIS — R05 Cough: Secondary | ICD-10-CM

## 2015-03-31 DIAGNOSIS — R059 Cough, unspecified: Secondary | ICD-10-CM

## 2015-03-31 LAB — POCT CBC
Granulocyte percent: 65.5 %G (ref 37–80)
HCT, POC: 38.2 % (ref 37.7–47.9)
Hemoglobin: 12.7 g/dL (ref 12.2–16.2)
Lymph, poc: 3 (ref 0.6–3.4)
MCH, POC: 28.5 pg (ref 27–31.2)
MCHC: 33.4 g/dL (ref 31.8–35.4)
MCV: 85.4 fL (ref 80–97)
MID (cbc): 0.4 (ref 0–0.9)
MPV: 7.1 fL (ref 0–99.8)
POC Granulocyte: 6.4 (ref 2–6.9)
POC LYMPH PERCENT: 30.8 %L (ref 10–50)
POC MID %: 3.7 %M (ref 0–12)
Platelet Count, POC: 332 10*3/uL (ref 142–424)
RBC: 4.47 M/uL (ref 4.04–5.48)
RDW, POC: 15.4 %
WBC: 9.7 10*3/uL (ref 4.6–10.2)

## 2015-03-31 LAB — D-DIMER, QUANTITATIVE: D-Dimer, Quant: 0.87 ug/mL-FEU — ABNORMAL HIGH (ref 0.00–0.48)

## 2015-03-31 MED ORDER — ALBUTEROL SULFATE (2.5 MG/3ML) 0.083% IN NEBU
2.5000 mg | INHALATION_SOLUTION | Freq: Once | RESPIRATORY_TRACT | Status: AC
  Administered 2015-03-31: 2.5 mg via RESPIRATORY_TRACT

## 2015-03-31 NOTE — Patient Instructions (Signed)
Continue your current medications  I will let you know the results of your labs, and will decide course  In the event of worsening go to the ER  Recommend seeing hematologist in a few months.  Return as needed

## 2015-03-31 NOTE — Progress Notes (Addendum)
Patient ID: Deborah Soto, female    DOB: Nov 27, 1965  Age: 50 y.o. MRN: [id number]  Chief Complaint  Patient presents with  . Shortness of Breath    starting this morning  . Wheezing  . Dizziness    Subjective:   Patient had a pulmonary embolus about 9 days ago. Interestingly enough she had had similar symptoms in December that were evaluated extensively and did not have a PE, but now has been on anticoagulant therapy for 9 days. She has had some stabbing chest pains in her back areas that she attributes to the pulmonary embolus still. It hurts when she lays down at times. She has been short of breath for a couple of years. Today she had a counseling session for a couple of hours it was very intense. After that she developed chest tightness and more shortness of breath. The tightness was in the substernal area. She has had recent cardiac evaluation. No nausea or vomiting or dizziness or diaphoresis.  Current allergies, medications, problem list, past/family and social histories reviewed.  Objective:  BP 134/90 mmHg  Pulse 99  Temp(Src) 98.4 F (36.9 C) (Oral)  Resp 18  Ht  (1.727 m)  Wt 269 lb (122.018 kg)  BMI 40.91 kg/m2  SpO2 99%  LMP 03/04/2015  Obese lady come in by her husband. Her throat is clear. Neck supple without nodes. No carotid bruits. Chest clear to auscultation. Heart regular without murmurs. No expiratory wheeze. Abdomen soft, nontender.  EKG normal  Assessment & Plan:   Assessment: 1. Other chest pain   2. History of pulmonary embolism   3. Shortness of breath   4. Morbid obesity, unspecified obesity type (HCC)   5. Cough       Plan: Will check a d-dimer on her also. If it is positive it could be from the previous clot but it also could be from something acute and we will have to discuss whether she needs to go in at that point. She is hardly on anticoagulant therapy, so there is little else to do differently at this point. [facility] reading (PRIMARY)  by  Dr. Alwyn Ren Normal chest  Results for orders placed or performed in visit on 03/31/15  POCT CBC  Result Value Ref Range   WBC 9.7 4.6 - 10.2 K/uL   Lymph, poc 3.0 0.6 - 3.4   POC LYMPH PERCENT 30.8 10 - 50 %L   MID (cbc) 0.4 0 - 0.9   POC MID % 3.7 0 - 12 %M   POC Granulocyte 6.4 2 - 6.9   Granulocyte percent 65.5 37 - 80 %G   RBC 4.47 4.04 - 5.48 M/uL   Hemoglobin 12.7 12.2 - 16.2 g/dL   HCT, POC 19.1 47.8 - 47.9 %   MCV 85.4 80 - 97 fL   MCH, POC 28.5 27 - 31.2 pg   MCHC 33.4 31.8 - 35.4 g/dL   RDW, POC 29.5 %   Platelet Count, POC 332 142 - 424 K/uL   MPV 7.1 0 - 99.8 fL   EKG: Mild sinus tachycardia, otherwise normal   Orders Placed This Encounter  Procedures  . DG Chest 2 View    Order Specific Question:  Reason for Exam (SYMPTOM  OR DIAGNOSIS REQUIRED)    Answer:  chest tightness and pain    Order Specific Question:  Is the patient pregnant?    Answer:  No    Order Specific Question:  Preferred imaging location?    Answer:  External  . D-dimer, quantitative (not at [facility])  . POCT CBC  . EKG 12-Lead    Meds ordered this encounter  Medications  . albuterol (PROVENTIL) (2.5 MG/3ML) 0.083% nebulizer solution 2.5 mg    Sig:          Patient Instructions  Continue your current medications  I will let you know the results of your labs, and will decide course  In the event of worsening go to the ER  Recommend seeing hematologist in a few months.  Return as needed     Return if symptoms worsen or fail to improve.   Kashay Cavenaugh, MD 03/31/2015    Addendum: Patient was noted to have an elevated d-dimer still, and on Friday we attempted to do a follow-up lung scan. That was canceled due to the patient being tied up with her daughters surgery. She got the repeat lung scan done this morning, Saturday the 21st. It does not appear that she is having more clots though she has a little pleural effusion and a subacute area that was not seen before. I spoke  to the patient on the phone. She is continuing to be very tired and feels short of breath. It was decided to continue her on the same anticoagulant medication and just take it easy the next few days. She is not to go to work Monday. If she is not doing better by Monday or Tuesday she is to return for a recheck here. If she is worsening at anytime she is to go to the emergency room. Explained to her that there is always the chance of some failure of one of the blood thinners but there was no good evidence to my knowledge of a great benefit of changing yet. However if she continues to have symptoms we may wish to consider switching her back to Lovenox and Coumadin. I will see her in 3 days if she is not feeling like she has steadily improved.

## 2015-04-01 ENCOUNTER — Other Ambulatory Visit: Payer: Self-pay | Admitting: *Deleted

## 2015-04-01 ENCOUNTER — Other Ambulatory Visit: Payer: Self-pay | Admitting: Family Medicine

## 2015-04-01 ENCOUNTER — Ambulatory Visit (HOSPITAL_COMMUNITY): Payer: 59

## 2015-04-01 DIAGNOSIS — R0789 Other chest pain: Secondary | ICD-10-CM

## 2015-04-01 DIAGNOSIS — R0602 Shortness of breath: Secondary | ICD-10-CM

## 2015-04-01 DIAGNOSIS — R7989 Other specified abnormal findings of blood chemistry: Secondary | ICD-10-CM

## 2015-04-02 ENCOUNTER — Encounter (HOSPITAL_COMMUNITY): Payer: Self-pay

## 2015-04-02 ENCOUNTER — Ambulatory Visit (HOSPITAL_COMMUNITY)
Admission: RE | Admit: 2015-04-02 | Discharge: 2015-04-02 | Disposition: A | Discharge status: Home / Self Care | Payer: 59 | Source: Ambulatory Visit | Attending: Family Medicine | Admitting: Family Medicine

## 2015-04-02 DIAGNOSIS — Z7901 Long term (current) use of anticoagulants: Secondary | ICD-10-CM | POA: Insufficient documentation

## 2015-04-02 DIAGNOSIS — R0602 Shortness of breath: Secondary | ICD-10-CM

## 2015-04-02 DIAGNOSIS — R0789 Other chest pain: Secondary | ICD-10-CM | POA: Diagnosis present

## 2015-04-02 DIAGNOSIS — K760 Fatty (change of) liver, not elsewhere classified: Secondary | ICD-10-CM | POA: Insufficient documentation

## 2015-04-02 DIAGNOSIS — I2699 Other pulmonary embolism without acute cor pulmonale: Secondary | ICD-10-CM | POA: Diagnosis not present

## 2015-04-02 DIAGNOSIS — J90 Pleural effusion, not elsewhere classified: Secondary | ICD-10-CM | POA: Diagnosis not present

## 2015-04-02 DIAGNOSIS — R918 Other nonspecific abnormal finding of lung field: Secondary | ICD-10-CM | POA: Insufficient documentation

## 2015-04-02 DIAGNOSIS — R791 Abnormal coagulation profile: Secondary | ICD-10-CM | POA: Diagnosis not present

## 2015-04-02 DIAGNOSIS — R7989 Other specified abnormal findings of blood chemistry: Secondary | ICD-10-CM

## 2015-04-02 MED ORDER — IOHEXOL 350 MG/ML SOLN
100.0000 mL | Freq: Once | INTRAVENOUS | Status: AC | PRN
  Administered 2015-04-02: 100 mL via INTRAVENOUS

## 2015-04-02 NOTE — Progress Notes (Deleted)
   Subjective:    Patient ID: Deborah Soto, female    DOB: [birth year], 50 y.o.   MRN: [id number]  HPI    Review of Systems     Objective:   Physical Exam        Assessment & Plan:

## 2015-04-12 ENCOUNTER — Emergency Department (HOSPITAL_COMMUNITY)
Admission: EM | Admit: 2015-04-12 | Discharge: 2015-04-13 | Disposition: A | Discharge status: Home / Self Care | Payer: 59 | Attending: Emergency Medicine | Admitting: Emergency Medicine

## 2015-04-12 ENCOUNTER — Encounter (HOSPITAL_COMMUNITY): Payer: Self-pay | Admitting: *Deleted

## 2015-04-12 ENCOUNTER — Emergency Department (INDEPENDENT_AMBULATORY_CARE_PROVIDER_SITE_OTHER)
Admission: EM | Admit: 2015-04-12 | Discharge: 2015-04-12 | Disposition: A | Discharge status: Other Acute Inpatient Hospital | Payer: 59 | Source: Home / Self Care | Attending: Family Medicine | Admitting: Family Medicine

## 2015-04-12 ENCOUNTER — Encounter (HOSPITAL_COMMUNITY): Payer: Self-pay | Admitting: Cardiology

## 2015-04-12 DIAGNOSIS — F329 Major depressive disorder, single episode, unspecified: Secondary | ICD-10-CM | POA: Insufficient documentation

## 2015-04-12 DIAGNOSIS — R079 Chest pain, unspecified: Secondary | ICD-10-CM

## 2015-04-12 DIAGNOSIS — Z79899 Other long term (current) drug therapy: Secondary | ICD-10-CM | POA: Insufficient documentation

## 2015-04-12 DIAGNOSIS — R0600 Dyspnea, unspecified: Secondary | ICD-10-CM

## 2015-04-12 DIAGNOSIS — I2699 Other pulmonary embolism without acute cor pulmonale: Secondary | ICD-10-CM | POA: Diagnosis not present

## 2015-04-12 DIAGNOSIS — Z87891 Personal history of nicotine dependence: Secondary | ICD-10-CM | POA: Insufficient documentation

## 2015-04-12 DIAGNOSIS — R0609 Other forms of dyspnea: Secondary | ICD-10-CM | POA: Diagnosis not present

## 2015-04-12 DIAGNOSIS — I2782 Chronic pulmonary embolism: Secondary | ICD-10-CM

## 2015-04-12 LAB — BASIC METABOLIC PANEL
Anion gap: 13 (ref 5–15)
BUN: 10 mg/dL (ref 6–20)
CO2: 23 mmol/L (ref 22–32)
Calcium: 9.8 mg/dL (ref 8.9–10.3)
Chloride: 105 mmol/L (ref 101–111)
Creatinine, Ser: 1.23 mg/dL — ABNORMAL HIGH (ref 0.44–1.00)
GFR calc Af Amer: 60 mL/min — ABNORMAL LOW (ref >60)
GFR calc non Af Amer: 51 mL/min — ABNORMAL LOW (ref >60)
Glucose, Bld: 208 mg/dL — ABNORMAL HIGH (ref 65–99)
Potassium: 3.7 mmol/L (ref 3.5–5.1)
Sodium: 141 mmol/L (ref 135–145)

## 2015-04-12 LAB — PROTIME-INR
INR: 1.79 — ABNORMAL HIGH (ref 0.00–1.49)
Prothrombin Time: 20.7 seconds — ABNORMAL HIGH (ref 11.6–15.2)

## 2015-04-12 LAB — CBC
HCT: 42.8 % (ref 36.0–46.0)
Hemoglobin: 13.9 g/dL (ref 12.0–15.0)
MCH: 28.8 pg (ref 26.0–34.0)
MCHC: 32.5 g/dL (ref 30.0–36.0)
MCV: 88.8 fL (ref 78.0–100.0)
Platelets: 318 10*3/uL (ref 150–400)
RBC: 4.82 MIL/uL (ref 3.87–5.11)
RDW: 14.5 % (ref 11.5–15.5)
WBC: 10.1 10*3/uL (ref 4.0–10.5)

## 2015-04-12 LAB — I-STAT TROPONIN, ED: Troponin i, poc: 0 ng/mL (ref 0.00–0.08)

## 2015-04-12 MED ORDER — KETOROLAC TROMETHAMINE 30 MG/ML IJ SOLN
30.0000 mg | Freq: Once | INTRAMUSCULAR | Status: AC
  Administered 2015-04-12: 30 mg via INTRAVENOUS
  Filled 2015-04-12: qty 1

## 2015-04-12 MED ORDER — OXYCODONE-ACETAMINOPHEN 5-325 MG PO TABS
1.0000 | ORAL_TABLET | Freq: Once | ORAL | Status: AC
  Administered 2015-04-13: 1 via ORAL
  Filled 2015-04-12: qty 1

## 2015-04-12 MED ORDER — SODIUM CHLORIDE 0.9 % IV BOLUS (SEPSIS)
1000.0000 mL | Freq: Once | INTRAVENOUS | Status: AC
  Administered 2015-04-12: 1000 mL via INTRAVENOUS

## 2015-04-12 MED ORDER — OXYCODONE-ACETAMINOPHEN 5-325 MG PO TABS: 1.0000 | ORAL_TABLET | Freq: Once | ORAL | Status: DC

## 2015-04-12 NOTE — ED Provider Notes (Addendum)
CSN: [id number]     Arrival date & time 04/12/15  1555 History   First MD Initiated Contact with Patient 04/12/15 1618     Chief Complaint  Patient presents with  . Chest Pain   (Consider location/radiation/quality/duration/timing/severity/associated sxs/prior Treatment) Patient is a 50 y.o. female presenting with chest pain. The history is provided by the patient.  Chest Pain Pain location:  Substernal area Pain quality: sharp and tightness   Pain radiates to:  Does not radiate Pain radiates to the back: no   Pain severity:  Moderate Onset quality:  Gradual Progression:  Worsening Chronicity:  Recurrent Context comment:  No source for emboli found according to pt., no smoking or bcp. Relieved by:  Nothing Associated symptoms: anxiety and shortness of breath   Associated symptoms: no lower extremity edema and no palpitations   Risk factors: prior DVT/PE   Risk factors: no birth control     Past Medical History  Diagnosis Date  . Depression   . Embolism - blood clot     legs & lung   History reviewed. No pertinent past surgical history. Family History  Problem Relation Age of Onset  . Dementia Father    Social History  Substance Use Topics  . Smoking status: Former Smoker -- 1.00 packs/day for 25 years    Quit date: 03/12/2004  . Smokeless tobacco: None  . Alcohol Use: No   OB History    No data available     Review of Systems  Constitutional: Negative.   Respiratory: Positive for shortness of breath.   Cardiovascular: Positive for chest pain. Negative for palpitations.  All other systems reviewed and are negative.   Allergies  Review of patient's allergies indicates no known allergies.  Home Medications   Prior to Admission medications   Medication Sig Start Date End Date Taking? Authorizing Provider  acetaminophen (TYLENOL) 325 MG tablet Take 2 tablets (650 mg total) by mouth every 6 (six) hours as needed for mild pain (or Fever >/= 101). Patient not  taking: Reported on 03/31/2015 03/24/15   Almon Hercules, MD  Brexpiprazole (REXULTI) 0.5 MG TABS Take 1 mg by mouth daily.     Historical Provider, MD  Clindamycin-Benzoyl Per, Refr, gel Apply 1 application topically 2 (two) times daily. Reported on 03/31/2015 03/10/15   Historical Provider, MD  escitalopram (LEXAPRO) 20 MG tablet Take 20 mg by mouth at bedtime.  03/10/15   Historical Provider, MD  oxyCODONE (ROXICODONE) 5 MG immediate release tablet Take 1 tablet (5 mg total) by mouth every 6 (six) hours as needed for severe pain. Patient not taking: Reported on 03/31/2015 03/24/15   Almon Hercules, MD  Rivaroxaban (XARELTO) 15 MG TABS tablet Take 1 tablet (15 mg total) by mouth 2 (two) times daily with a meal. 03/24/15   Almon Hercules, MD  rivaroxaban (XARELTO) 20 MG TABS tablet Take 1 tablet (20 mg total) by mouth daily with supper. Start on 22nd day Patient not taking: Reported on 03/31/2015 03/24/15   Beaulah Dinning, MD   Meds Ordered and Administered this Visit  Medications - No data to display  BP 150/96 mmHg  Pulse 106  Temp(Src) 98 F (36.7 C) (Oral)  Resp 16  SpO2 98%  LMP 03/04/2015 No data found.   Physical Exam  Constitutional: She is oriented to person, place, and time. She appears well-developed and well-nourished. No distress.  Neck: Normal range of motion. Neck supple.  Cardiovascular: Regular rhythm and intact distal pulses.  Tachycardia present.  Exam reveals gallop.   No murmur heard. Pulmonary/Chest: Effort normal and breath sounds normal. No respiratory distress. She has no rales. She exhibits no tenderness.  Abdominal: Soft. Bowel sounds are normal.  Musculoskeletal: She exhibits no edema.  Neurological: She is alert and oriented to person, place, and time.  Skin: Skin is warm and dry.  Nursing note and vitals reviewed.   ED Course  Procedures (including critical care time)  Labs Review Labs Reviewed - No data to display  Imaging Review No results  found.   Visual Acuity Review  Right Eye Distance:   Left Eye Distance:   Bilateral Distance:    Right Eye Near:   Left Eye Near:    Bilateral Near:     ecg--sinus tach, no acute changes.    MDM   1. Exertional dyspnea    Sent for further eval of post PE chest pain and doe, sx getting worse and not responding to Xarelto.    Linna Hoff, MD 04/12/15 1630  Linna Hoff, MD 04/12/15 [phone]

## 2015-04-12 NOTE — ED Notes (Signed)
Pt  Reports  Symptoms  Of  Chest  Pain   And  Shortness  Of  Breath       For  Several  Weeks    History  Of   PE     sev  Weeks  Ago       Pt   Reports  Some  Shortness  Of  Breath  As  Well      She  Is  Sitting  Upright on the  Exam table  Speaking in  Complete  sentances

## 2015-04-12 NOTE — ED Notes (Signed)
Pt reports chest pain and SOB. States she was recently dx with a PE and on Xarelto.

## 2015-04-12 NOTE — Progress Notes (Signed)
Radiology noted small pleural effusion, which I am confident is just related to her recent PE   Sandria Bales. Alwyn Ren MD

## 2015-04-13 MED ORDER — OXYCODONE-ACETAMINOPHEN 5-325 MG PO TABS: 2.0000 | ORAL_TABLET | ORAL | Status: DC | PRN

## 2015-04-13 NOTE — Discharge Instructions (Signed)
Chest Wall Pain Chest wall pain is pain in or around the bones and muscles of your chest. Sometimes, an injury causes this pain. Sometimes, the cause may not be known. This pain may take several weeks or longer to get better. HOME CARE INSTRUCTIONS  Pay attention to any changes in your symptoms. Take these actions to help with your pain:   Rest as told by your health care provider.   Avoid activities that cause pain. These include any activities that use your chest muscles or your abdominal and side muscles to lift heavy items.   If directed, apply ice to the painful area:  Put ice in a plastic bag.  Place a towel between your skin and the bag.  Leave the ice on for 20 minutes, 2-3 times per day.  Take over-the-counter and prescription medicines only as told by your health care provider.  Do not use tobacco products, including cigarettes, chewing tobacco, and e-cigarettes. If you need help quitting, ask your health care provider.  Keep all follow-up visits as told by your health care provider. This is important. SEEK MEDICAL CARE IF:  You have a fever.  Your chest pain becomes worse.  You have new symptoms. SEEK IMMEDIATE MEDICAL CARE IF:  You have nausea or vomiting.  You feel sweaty or light-headed.  You have a cough with phlegm (sputum) or you cough up blood.  You develop shortness of breath.   This information is not intended to replace advice given to you by your health care provider. Make sure you discuss any questions you have with your health care provider.   Document Released: 02/26/2005 Document Revised: 11/17/2014 Document Reviewed: 05/24/2014 Elsevier Interactive Patient Education 2016 [employer]  Venous Thromboembolism Venous thromboembolism (VTE) is a condition in which a blood clot (thrombus) develops in the body. A thrombus usually occurs in a deep vein in the leg or the pelvis, but it can also occur in the arm. Sometimes, pieces of a thrombus can  break off from its original place of development and travel through the bloodstream to other parts of the body. When that happens, the thrombus is called an embolism. An embolism can block the blood flow in the blood vessels of other organs. There are two serious types of VTE:  Deep vein thrombosis (DVT). A DVT is a thrombus that usually occurs in a deep, larger vein of the lower leg or the pelvis, or in an upper extremity such as the arm.  Pulmonary embolism (PE). A PE occurs when an embolism has formed and traveled to the lungs. A PE can block or decrease the blood flow in one lung or both lungs. VTE is a serious health condition that can cause disability or death. It is very important to get help right away and to not ignore symptoms. CAUSES VTE is caused by the formation of a blood clot in your leg, pelvis, or arm. Usually, several things contribute to the formation of blood clots. A clot may develop when:  Your blood flow slows down.  Your vein becomes damaged in some way.  You have a condition that makes your blood clot more easily. RISK FACTORS A VTE is more likely to develop in:  People who are older, especially over 48 years of age.  People who are overweight (obese).  People who sit or lie still for a long time, such as during long-distance travel (over 4 hours), bed rest, hospitalization, or during recovery from certain medical conditions like a stroke.  People who do not engage in much physical activity (sedentary lifestyle).  People who have chronic breathing disorders.  People who have a personal or family history of blood clots or blood clotting disease.  People who have peripheral vascular disease (PVD), diabetes, or some types of cancer.  People who have heart disease, especially if the person had a recent heart attack or has congestive heart failure.  People who have neurological diseases that affect the legs (leg paresis).  People who have had a traumatic  injury, such as breaking a hip or leg.  People who have recently had major or lengthy surgery, especially on the hip, knee, or abdomen.  People who have had a central line placed inside a large vein.  People who take medicines that contain the hormone estrogen. These include birth control pills and hormone replacement therapy.  Pregnancy or during childbirth or the postpartum period.  Long plane flights (over 8 hours). SIGNS AND SYMPTOMS  Symptoms of VTE can depend on where the clot is located and whether the clot breaks off and travels to another organ. Sometimes, there may be no symptoms. Symptoms of a DVT can include:  Swelling of your leg or arm, especially if one side is much worse.  Warmth and redness of your leg or arm, especially if one side is much worse.  Pain in your arm or leg. If the clot is in your leg, symptoms may be more noticeable or worse when you stand or walk.  A feeling of pins and needles if the clot is in the arm. The symptoms of a PE usually start suddenly and include:  Shortness of breath while active or at rest.  Coughing or coughing up blood or blood-tinged mucus.  Chest pain that is often worse with deep breaths.  Rapid or irregular heartbeat.  Feeling light-headed or dizzy.  Fainting.  Feeling anxious.  Sweating. There may also be pain and swelling in a leg if that is where the blood clot started. These symptoms may represent a serious problem that is an emergency. Do not wait to see if the symptoms will go away. Get medical help right away. Call your local emergency services (911 in the [place] Do not drive yourself to the hospital. DIAGNOSIS Your health care provider will take a medical history and perform a physical exam. You may also have other tests, including:  Blood tests to assess the clotting properties of your blood.  Imaging tests, such as CT, ultrasound, MRI, X-ray, and other tests to see if you have clots anywhere in your  body.  An electrocardiogram (ECG) to look for heart strain from blood clots in the lungs.  An echocardiogram. TREATMENT After a VTE is identified, it can be treated. The main goals of treatment are:  To stop a blood clot from growing larger.  To stop new blood clots from forming.  To stop a blood clot from traveling to the lungs (pulmonary embolism). The type of treatment that you receive depends on many factors, such as the cause of your VTE, your risk for bleeding or developing more clots, and other medical conditions that you have. Sometimes, a combination of treatments is necessary. Treatment options may be combined and include:  Monitoring the blood clot with ultrasound.  Taking medicines by mouth, such as newer blood thinners (anticoagulants), thrombolytics, or warfarin.  Taking anticoagulant medicine by injection or through an IV tube.  Wearingcompression stockings or using different types of devices.  Surgery (rare) to remove the blood  clot or to place a filter in your abdomen to stop the blood clot from traveling to your lungs. Treatments for VTE are often divided into immediate treatment and long-term treatment (up to 3 months after VTE). You can work with your health care provider to choose the treatment program that is best for you. HOME CARE INSTRUCTIONS If you are taking a newer oral anticoagulant:  Take the medicine every single day at the same time each day.  Understand what foods and drugs interact with this medicine.  Understand that there are no regular blood tests required when using this medicine.  Understand the side effects of this medicine, including excessive bruising or bleeding. Ask your health care provider or pharmacist about other possible side effects. If you are taking warfarin:  Understand how to take warfarin and know which foods can affect how warfarin works in Public relations account executive.  Understand that it is dangerous to take too much or too little  warfarin. Too much warfarin increases the risk of bleeding. Too little warfarin continues to allow the risk for blood clots.  Follow your PT and INR blood testing schedule. The PT and INR results allow your health care provider to adjust your dose of warfarin. It is very important that you have your PT and INR tested as often as told by your health care provider.  Avoid major changes in your diet, or tell your health care provider before you change your diet. Arrange a visit with a registered dietitian to answer your questions. Many foods, especially foods that are high in vitamin K, can interfere with warfarin and affect the PT and INR results. Eat a consistent amount of foods that are high in vitamin K, such as:  Spinach, kale, broccoli, cabbage, collard greens, turnip greens, Brussels sprouts, peas, cauliflower, seaweed, and parsley.  Beef liver and pork liver.  Green tea.  Soybean oil.  Tell your health care provider about any and all medicines, vitamins, and supplements that you take, including aspirin and other over-the-counter anti-inflammatory medicines. Be especially cautious with aspirin and anti-inflammatory medicines. Do not take those before you ask your health care provider if it is safe to do so. This is important because many medicines can interfere with warfarin and affect the PT and INR results.  Do not start or stop taking any over-the-counter or prescription medicine unless your health care provider or pharmacist tells you to do so. If you take warfarin, you will also need to do these things:  Hold pressure over cuts for longer than usual.  Tell your dentist and other health care providers that you are taking warfarin before you have any procedures in which bleeding may occur.  Avoid alcohol or drink very small amounts. Tell your health care provider if you change your alcohol intake.  Do not use tobacco products, including cigarettes, chewing tobacco, and e-cigarettes.  If you need help quitting, ask your health care provider.  Avoid contact sports. General Instructions  Take over-the-counter and prescription medicines only as told by your health care provider. Anticoagulant medicines can have side effects, including easy bruising and difficulty stopping bleeding. If you are prescribed an anticoagulant, you will also need to do these things:  Hold pressure over cuts for longer than usual.  Tell your dentist and other health care providers that you are taking anticoagulants before you have any procedures in which bleeding may occur.  Avoid contact sports.  Wear a medical alert bracelet or carry a medical alert card that says you  have had a PE.  Ask your health care provider how soon you can go back to your normal activities. Stay active to prevent new blood clots from forming.  Make sure to exercise while traveling or when you have been sitting or standing for a long period of time. It is very important to exercise. Exercise your legs by walking or by tightening and relaxing your leg muscles often. Take frequent walks.  Wear compression stockings as told by your health care provider to help prevent more blood clots from forming.  Do not use tobacco products, including cigarettes, chewing tobacco, and e-cigarettes. If you need help quitting, ask your health care provider.  Keep all follow-up appointments with your health care provider. This is important. PREVENTION Take these actions to decrease your risk of developing another VTE:  Exercise regularly. For at least 30 minutes every day, engage in:  Activity that involves moving your arms and legs.  Activity that encourages good blood flow through your body by increasing your heart rate.  Exercise your arms and legs every hour during long-distance travel (over 4 hours). Drink plenty of water and avoid drinking alcohol while traveling.  Avoid sitting or lying in bed for long periods of time without  moving your legs.  Maintain a weight that is appropriate for your height. Ask your health care provider what weight is healthy for you.  If you are a woman who is over 50 years of age, avoid unnecessary use of medicines that contain estrogen. These include birth control pills.  Do not smoke, especially if you take estrogen medicines. If you need help quitting, ask your health care provider. If you are hospitalized, prevention measures may include:  Early walking after surgery, as soon as your health care provider says that it is safe.  Receiving anticoagulants to prevent blood clots.If you cannot take anticoagulants, other options may be available, such as wearing compression stockings or using different types of devices. SEEK IMMEDIATE MEDICAL CARE IF:  You have new or increased pain, swelling, or redness in an arm or leg.  You have numbness or tingling in an arm or leg.  You have shortness of breath while active or at rest.  You have chest pain.  You have a rapid or irregular heartbeat.  You feel light-headed or dizzy.  You cough up blood.  You notice blood in your vomit, bowel movement, or urine. These symptoms may represent a serious problem that is an emergency. Do not wait to see if the symptoms will go away. Get medical help right away. Call your local emergency services (911 in the [place] Do not drive yourself to the hospital.   This information is not intended to replace advice given to you by your health care provider. Make sure you discuss any questions you have with your health care provider.   Follow up with pulmonology for re-evaluation. Use incentive spirometer daily. Continue taking home xarelto as prescribed. Take pain medications as needed. Return to the ED if you experience worsening of your symptoms, dizziness, loss of consciousness, increased chest pain.

## 2015-04-13 NOTE — ED Provider Notes (Signed)
CSN: [id number]     Arrival date & time 04/12/15  1709 History   First MD Initiated Contact with Patient 04/12/15 2014     Chief Complaint  Patient presents with  . Chest Pain     (Consider location/radiation/quality/duration/timing/severity/associated sxs/prior Treatment) HPI   Deborah Soto is a 50 y.o F with past medical history of pulmonary embolism diagnosed on 03/22/2014, on Xarelto presents to the ED today complaining of continued chest pain and shortness of breath. Patient states that since her discharge she has continued to feel substernal chest pain that radiates through to her back with dyspnea on exertion. She was seen at urgent care last week for same symptoms and had a repeat CTA which showed improving clot burden. Patient states that since that time her symptoms have not improved. She went to urgent care can today and was sent to the ED for further evaluation. Patient has not taken pain medications in the last 2 weeks. Patient states that while she was taking them they did improve her symptoms. Denies syncope, dizziness, paresthesias, headache.   Past Medical History  Diagnosis Date  . Depression   . Embolism - blood clot     legs & lung   History reviewed. No pertinent past surgical history. Family History  Problem Relation Age of Onset  . Dementia Father    Social History  Substance Use Topics  . Smoking status: Former Smoker -- 1.00 packs/day for 25 years    Quit date: 03/12/2004  . Smokeless tobacco: None  . Alcohol Use: No   OB History    No data available     Review of Systems  All other systems reviewed and are negative.     Allergies  Review of patient's allergies indicates no known allergies.  Home Medications   Prior to Admission medications   Medication Sig Start Date End Date Taking? Authorizing Provider  Brexpiprazole (REXULTI) 0.5 MG TABS Take 1 mg by mouth daily.    Yes Historical Provider, MD  escitalopram (LEXAPRO) 20 MG tablet Take  20 mg by mouth at bedtime.  03/10/15  Yes Historical Provider, MD  Rivaroxaban (XARELTO) 15 MG TABS tablet Take 1 tablet (15 mg total) by mouth 2 (two) times daily with a meal. 03/24/15  Yes Almon Hercules, MD  Clindamycin-Benzoyl Per, Refr, gel Apply 1 application topically 2 (two) times daily. Reported on 03/31/2015 03/10/15   Historical Provider, MD   BP 152/99 mmHg  Pulse 103  Temp(Src) 98.2 F (36.8 C) (Oral)  Resp 17  SpO2 99%  LMP 03/04/2015 Physical Exam  Constitutional: She is oriented to person, place, and time. She appears well-developed and well-nourished. No distress.  HENT:  Head: Normocephalic and atraumatic.  Mouth/Throat: No oropharyngeal exudate.  Eyes: Conjunctivae and EOM are normal. Pupils are equal, round, and reactive to light. Right eye exhibits no discharge. Left eye exhibits no discharge. No scleral icterus.  Neck: Normal range of motion. Neck supple.  Cardiovascular: Normal rate, regular rhythm, normal heart sounds and intact distal pulses.  Exam reveals no gallop and no friction rub.   No murmur heard. Pulmonary/Chest: Effort normal and breath sounds normal. No respiratory distress. She has no wheezes. She has no rales. She exhibits tenderness ( substernal tenderness with deep palpation).  Abdominal: Soft. She exhibits no distension. There is no tenderness. There is no guarding.  Musculoskeletal: Normal range of motion. She exhibits no edema.  Lymphadenopathy:    She has no cervical adenopathy.  Neurological: She is  alert and oriented to person, place, and time. No cranial nerve deficit.  Strength 5/5 throughout. No sensory deficits.    Skin: Skin is warm and dry. No rash noted. She is not diaphoretic. No erythema. No pallor.  Psychiatric: She has a normal mood and affect. Her behavior is normal.  Nursing note and vitals reviewed.   ED Course  Procedures (including critical care time) Labs Review Labs Reviewed  BASIC METABOLIC PANEL - Abnormal; Notable for  the following:    Glucose, Bld 208 (*)    Creatinine, Ser 1.23 (*)    GFR calc non Af Amer 51 (*)    GFR calc Af Amer 60 (*)    All other components within normal limits  PROTIME-INR - Abnormal; Notable for the following:    Prothrombin Time 20.7 (*)    INR 1.79 (*)    All other components within normal limits  CBC  I-STAT TROPOININ, ED    Imaging Review No results found. I have personally reviewed and evaluated these images and lab results as part of my medical decision-making.   EKG Interpretation   Date/Time:  Tuesday April 12 2015 17:30:27 EST Ventricular Rate:  107 PR Interval:  120 QRS Duration: 80 QT Interval:  316 QTC Calculation: 421 R Axis:   13 Text Interpretation:  Sinus tachycardia Minimal voltage criteria for LVH,  may be normal variant Nonspecific T wave abnormality Abnormal ECG ED  PHYSICIAN INTERPRETATION AVAILABLE IN [facility] Confirmed by TEST,  Record ([id number]) on 04/13/2015 7:05:45 AM      MDM   Final diagnoses:  Chest pain, unspecified chest pain type  Other chronic pulmonary embolism without acute cor pulmonale (HCC)   50 y.o F recently diagnosed with PE, on xarelto, presents to the ED with continued CP and SOB. Pt states that she has had continued CP and SOB since her discharge with the diagnosis of PE. Pt was already seen by her PCP 1 week ago for same symptoms and had a repeat CT PE study done which showed improved clot burden and improved right heart strain. Pt states that initially after her discharge she was taking pain medication, which did seem to help but she has not been taking this for the last 2 weeks. Pt is concerned that her pain has not gone away yet. However, her symptoms are unchanged. On presentation to ED, pt appears well, in NAD. No hypoxia or tachycardia. Normal heart and lung exam. EKG wnl. Troponin wnl. Pt given pain medication.   Given that pts symptoms are unchanged, and that she has already had a repeat CT PE study done 1  week ago. Do not feel that any repeat imaging is necessary at this time. Discussed with pt that it may take some time for her pain to improve. Will d/c home with pain medication. Recommend follow up with pulmonologist. Will also d/c home with incentive spirometry. Continue taking xarelto.   Pt labs did reveal slight elevation in creatinine. Feel that this may be secondary to dehydration as pt states that she has had significantly decreased PO intake over the last week. Pt given 1 L fluids. Encourage pt to follow up with PCP regarding this. Return precautions outlined in patient discharge instructions.   Patient was discussed with and seen by Dr. Jeraldine Loots who agrees with the treatment plan.      Lester Kinsman [place], PA-C 04/14/15 2349  Gerhard Munch, MD 04/14/15 [phone]

## 2015-04-18 ENCOUNTER — Ambulatory Visit (INDEPENDENT_AMBULATORY_CARE_PROVIDER_SITE_OTHER): Payer: 59 | Admitting: Internal Medicine

## 2015-04-18 ENCOUNTER — Ambulatory Visit (INDEPENDENT_AMBULATORY_CARE_PROVIDER_SITE_OTHER): Payer: 59

## 2015-04-18 VITALS — BP 140/80 | HR 108 | Temp 99.0°F | Resp 20 | Ht 67.5 in | Wt 257.5 lb

## 2015-04-18 DIAGNOSIS — I2699 Other pulmonary embolism without acute cor pulmonale: Secondary | ICD-10-CM

## 2015-04-18 DIAGNOSIS — R0600 Dyspnea, unspecified: Secondary | ICD-10-CM | POA: Diagnosis not present

## 2015-04-18 DIAGNOSIS — R0789 Other chest pain: Secondary | ICD-10-CM | POA: Diagnosis not present

## 2015-04-18 DIAGNOSIS — K760 Fatty (change of) liver, not elsewhere classified: Secondary | ICD-10-CM

## 2015-04-18 DIAGNOSIS — R7989 Other specified abnormal findings of blood chemistry: Secondary | ICD-10-CM

## 2015-04-18 DIAGNOSIS — R748 Abnormal levels of other serum enzymes: Secondary | ICD-10-CM | POA: Diagnosis not present

## 2015-04-18 NOTE — Patient Instructions (Signed)
Because you received an x-ray today, you will receive an invoice from [facility]. Please contact [facility] at [phone] with questions or concerns regarding your invoice. Our billing staff will not be able to assist you with those questions. °

## 2015-04-18 NOTE — Progress Notes (Addendum)
Subjective:  By signing my name below, I, [name], attest that this documentation has been prepared under the direction and in the presence of Tonye Pearson, MD Electronically Signed: Charline Bills, ED Scribe 04/18/2015 at 4:01 PM.    Patient ID: Deborah Soto, female    DOB: 1965-08-21, 50 y.o.   MRN: [id number]  Chief Complaint  Patient presents with  . Follow-up    seen in the ER last week.  follow up with Dr. Merla Riches today  . Depression    see triage score   HPI HPI Comments: Deborah Soto is a 50 y.o. female, with a h/o PE and DVT, who presents to the [facility] for a follow-up regarding ED visit. Pt was last seen in the ED on 04/12/15 for chest pain and followup of PTE. They saw nothing new, but She states that she is still experiencing intermittent worsening substernal chest pain, chest tightness, chest palpitations, fatigue, SOB that is worsened with lying, intermittent sharp back pain, leg pain and unchanged leg swelling. Pt is currently on Xarelto. Pt has a f/u appointment with a pulmonologist in March.   ECHO hosp 1/11 wnl Recent cr incr <2  Patient Active Problem List   Diagnosis Date Noted  . Acute pulmonary embolism (HCC)--since young age 12/21/2015  . Acute pulmonary embolus (HCC) 03/23/2015  . Left sided chest pain   . Dyspnea   . Achilles tendonitis 04/08/2012  . 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 PTE (pulmonary thromboembolism) (HCC) 10/10/2011  . Preeclampsia 10/10/2011  . Diverticulitis 10/10/2011  . Bipolar affective disorder (HCC)---worsening of depression over last month--works with [employer]--years of hx with multiple hospitalizations and even ECT with lots of relapses--really struggling now 10/10/2011  . BMI 34.0-34.9,adult 10/10/2011  . AR (allergic rhinitis) 10/10/2011   Past Medical History  Diagnosis Date  . Depression   . Embolism - blood clot     legs & lung   Current  Outpatient Prescriptions on File Prior to Visit  Medication Sig Dispense Refill  . Brexpiprazole (REXULTI) 0.5 MG TABS Take 1 mg by mouth daily.     . Clindamycin-Benzoyl Per, Refr, gel Apply 1 application topically 2 (two) times daily. Reported on 03/31/2015    . escitalopram (LEXAPRO) 20 MG tablet Take 20 mg by mouth at bedtime.     Marland Kitchen oxyCODONE-acetaminophen (PERCOCET/ROXICET) 5-325 MG tablet Take 2 tablets by mouth every 4 (four) hours as needed for severe pain. 15 tablet 0  . Rivaroxaban (XARELTO) 15 MG TABS tablet Take 1 tablet (15 mg total) by mouth 2 (two) times daily with a meal. (Patient not taking: Reported on 04/18/2015) 42 tablet 0   No current facility-administered medications on file prior to visit.   No Known Allergies  Review of Systems  Constitutional: Positive for fatigue.  Respiratory: Positive for chest tightness and shortness of breath.   Cardiovascular: Positive for chest pain, palpitations and leg swelling (chronic).  Musculoskeletal: Positive for myalgias and back pain.      Objective:   Physical Exam  Constitutional: She is oriented to person, place, and time. She appears well-developed and well-nourished. No distress.  HENT:  Head: Normocephalic and atraumatic.  Eyes: Conjunctivae and EOM are normal.  Neck: Neck supple.  Cardiovascular: Normal rate.  An irregular rhythm present. Exam reveals no gallop.   No murmur heard. Rate: 85  Pulmonary/Chest: Effort normal. No respiratory distress.  Lungs are clear to auscultation.   Musculoskeletal: Normal range  of motion. She exhibits edema.  1+ edema on lower extremities   Neurological: She is alert and oriented to person, place, and time.  Skin: Skin is warm and dry.  Psychiatric: She has a normal mood and affect. Her behavior is normal.  Nursing note and vitals reviewed. CXR=improved effusion-no new findings    EKG=PACs  BP 140/80 mmHg  Pulse 108  Temp(Src) 99 F (37.2 C) (Oral)  Resp 20  Ht 5' 7.5"  (1.715 m)  Wt 257 lb 8 oz (116.801 kg)  BMI 39.71 kg/m2  SpO2 97%  LMP 04/11/2015 Wt Readings from Last 3 Encounters:  04/18/15 257 lb 8 oz (116.801 kg)  03/31/15 269 lb (122.018 kg)  03/23/15 260 lb 2.3 oz (118 kg)    Assessment & Plan:  I have completed the patient encounter in its entirety as documented by the scribe, with editing by me where necessary. Margaux Engen P. Merla Riches, M.D.   Other chest pain - Plan: DG Chest 2 View, EKG 12-Lead, Ambulatory referral to Cardiology  Paroxysmal nocturnal dyspnea - Plan: Ambulatory referral to Cardiology  Elevated serum creatinine - Plan: Basic metabolic panel, CBC with Differential/Platelet, Ambulatory referral to Cardiology  Embolism, pulmonary with infarction [facility]) - Plan: Ambulatory referral to Cardiology  Edema-1+   We need to rule out cardiac cause of persistent dyspnea even with recent nl ECHO

## 2015-04-19 LAB — BASIC METABOLIC PANEL
BUN: 9 mg/dL (ref 7–25)
CO2: 27 mmol/L (ref 20–31)
Calcium: 9.4 mg/dL (ref 8.6–10.2)
Chloride: 104 mmol/L (ref 98–110)
Creat: 0.9 mg/dL (ref 0.50–1.10)
Glucose, Bld: 113 mg/dL — ABNORMAL HIGH (ref 65–99)
Potassium: 3.9 mmol/L (ref 3.5–5.3)
Sodium: 144 mmol/L (ref 135–146)

## 2015-04-19 LAB — CBC WITH DIFFERENTIAL/PLATELET
Basophils Absolute: 0 10*3/uL (ref 0.0–0.1)
Basophils Relative: 0 % (ref 0–1)
Eosinophils Absolute: 0.3 10*3/uL (ref 0.0–0.7)
Eosinophils Relative: 4 % (ref 0–5)
HCT: 39.1 % (ref 36.0–46.0)
Hemoglobin: 13.1 g/dL (ref 12.0–15.0)
Lymphocytes Relative: 34 % (ref 12–46)
Lymphs Abs: 2.5 10*3/uL (ref 0.7–4.0)
MCH: 28.6 pg (ref 26.0–34.0)
MCHC: 33.5 g/dL (ref 30.0–36.0)
MCV: 85.4 fL (ref 78.0–100.0)
MPV: 10.3 fL (ref 8.6–12.4)
Monocytes Absolute: 0.7 10*3/uL (ref 0.1–1.0)
Monocytes Relative: 9 % (ref 3–12)
Neutro Abs: 3.9 10*3/uL (ref 1.7–7.7)
Neutrophils Relative %: 53 % (ref 43–77)
Platelets: 307 10*3/uL (ref 150–400)
RBC: 4.58 MIL/uL (ref 3.87–5.11)
RDW: 14.6 % (ref 11.5–15.5)
WBC: 7.4 10*3/uL (ref 4.0–10.5)

## 2015-04-24 NOTE — Progress Notes (Signed)
Electrophysiology Office Note   Date:  04/25/2015   ID:  Deborah Soto, DOB June 11, 1965, MRN [id number]  PCP:  Tonye Pearson, MD  Cardiologist:  Regan Lemming, MD    Chief Complaint  Patient presents with  . New Patient (Initial Visit)     History of Present Illness: Deborah Soto is a 50 y.o. female who presents today for cardiology evaluation.   She has a history of PE/DVT who presented to the ER on 1/31 with chest pain.  She continues to have substernal chest pain, palpitations, fatigue, and SOB worse with lying down.  She is currently on Xarelto.   Her symptoms started last September with shortness of breath.  She was walking up the stairs at her daughter's volleyball game and got very short of breath.  She was diagnosed with a PE in December and was put on Xarelto.  Since that time, she has continued to have pain in her chest and shortness of breath.  The pain is constant and under her left breast.  She says that she is unable to exercise due to the shortness of breath.  She also has PND and orthopnea.  Today, she denies symptoms of palpitations, lower extremity edema, claudication, dizziness, presyncope, syncope, bleeding, or neurologic sequela. The patient is tolerating medications without difficulties and is otherwise without complaint today.    Past Medical History  Diagnosis Date  . Depression   . Embolism - blood clot     legs & lung   Past Surgical History  Procedure Laterality Date  . No past surgeries       Current Outpatient Prescriptions  Medication Sig Dispense Refill  . Brexpiprazole (REXULTI) 1 MG TABS Take 1 tablet by mouth daily.    . Clindamycin-Benzoyl Per, Refr, gel Apply 1 application topically 2 (two) times daily. Reported on 03/31/2015    . escitalopram (LEXAPRO) 20 MG tablet Take 20 mg by mouth at bedtime.     Marland Kitchen oxyCODONE-acetaminophen (PERCOCET/ROXICET) 5-325 MG tablet Take 2 tablets by mouth every 4 (four) hours as needed for  severe pain. 15 tablet 0  . rivaroxaban (XARELTO) 20 MG TABS tablet Take 20 mg by mouth daily with supper.     No current facility-administered medications for this visit.    Allergies:   Review of patient's allergies indicates no known allergies.   Social History:  The patient  reports that she quit smoking about 11 years ago. She does not have any smokeless tobacco history on file. She reports that she does not drink alcohol or use illicit drugs.   Family History:  The patient's family history includes Dementia in her father.    ROS:  Please see the history of present illness.   Otherwise, review of systems is positive for fatigue, chest pain, DOE, palpitations, depression.   All other systems are reviewed and negative.    PHYSICAL EXAM: VS:  BP 124/98 mmHg  Pulse 101  LMP 04/11/2015 , BMI There is no weight on file to calculate BMI. GEN: Well nourished, well developed, in no acute distress HEENT: normal Neck: no JVD, carotid bruits, or masses Cardiac: RRR; no murmurs, rubs, or gallops,no edema  Respiratory:  clear to auscultation bilaterally, normal work of breathing GI: soft, nontender, nondistended, + BS MS: no deformity or atrophy Skin: warm and dry Neuro:  Strength and sensation are intact Psych: euthymic mood, full affect  EKG:  EKG is not ordered today.  Recent Labs: 03/14/2015: TSH 1.219 03/24/2015: ALT 19  04/18/2015: BUN 9; Creat 0.90; Hemoglobin 13.1; Platelets 307; Potassium 3.9; Sodium 144    Lipid Panel     Component Value Date/Time   CHOL 203* 10/10/2011 1523   TRIG 123 10/10/2011 1523   HDL 48 10/10/2011 1523   CHOLHDL 4.2 10/10/2011 1523   VLDL 25 10/10/2011 1523   LDLCALC 130* 10/10/2011 1523     Wt Readings from Last 3 Encounters:  04/18/15 257 lb 8 oz (116.801 kg)  03/31/15 269 lb (122.018 kg)  03/23/15 260 lb 2.3 oz (118 kg)      Other studies Reviewed: Additional studies/ records that were reviewed today include: TTE 03/23/15  Review of the  above records today demonstrates:  - Left ventricle: The cavity size was normal. Wall thickness was increased in a pattern of mild LVH. Systolic function was normal. The estimated ejection fraction was in the range of 60% to 65%. Wall motion was normal; there were no regional wall motion abnormalities. - Atrial septum: No defect or patent foramen ovale was identified.    ASSESSMENT AND PLAN:  1.  Chest pain: currently, she has some symptoms that are concerning of angina.  According to her, her symptoms should be improving at this point.  It is possible that she has been having cardiac issues that are causing her pain, but a lung infarction due to her PE is also on the differential.  To rule out cardiac causes, Deborah Soto order a stress test to determine if she has any further cardiac disease.    Current medicines are reviewed at length with the patient today.   The patient does not have concerns regarding her medicines.  The following changes were made today:  none  Labs/ tests ordered today include:  Orders Placed This Encounter  Procedures  . Myocardial Perfusion Imaging     Disposition:   FU with Deborah Soto PRN  Signed, Deborah Salceda Jorja Loa, MD  04/25/2015 11:21 AM     [facility] [address] [place] [address] [phone] (office) [phone] (fax)

## 2015-04-25 ENCOUNTER — Ambulatory Visit (INDEPENDENT_AMBULATORY_CARE_PROVIDER_SITE_OTHER): Payer: 59 | Admitting: Cardiology

## 2015-04-25 ENCOUNTER — Encounter: Payer: Self-pay | Admitting: Cardiology

## 2015-04-25 VITALS — BP 124/98 | HR 101

## 2015-04-25 DIAGNOSIS — R079 Chest pain, unspecified: Secondary | ICD-10-CM

## 2015-04-25 NOTE — Patient Instructions (Signed)
Medication Instructions:  Your physician recommends that you continue on your current medications as directed. Please refer to the Current Medication list given to you today.   Labwork: None  Testing/Procedures: Your physician has requested that you have a lexiscan myoview. For further information please visit [email or web address] Please follow instruction sheet, as given.  Follow-Up: Your physician recommends that you schedule a follow-up appointment AS NEEDED with cardiology.  Any Other Special Instructions Will Be Listed Below (If Applicable).     If you need a refill on your cardiac medications before your next appointment, please call your pharmacy.

## 2015-04-26 ENCOUNTER — Ambulatory Visit (HOSPITAL_COMMUNITY): Payer: 59 | Attending: Cardiology

## 2015-04-26 DIAGNOSIS — R0609 Other forms of dyspnea: Secondary | ICD-10-CM | POA: Insufficient documentation

## 2015-04-26 DIAGNOSIS — R0602 Shortness of breath: Secondary | ICD-10-CM | POA: Diagnosis not present

## 2015-04-26 DIAGNOSIS — R5383 Other fatigue: Secondary | ICD-10-CM | POA: Diagnosis not present

## 2015-04-26 DIAGNOSIS — R079 Chest pain, unspecified: Secondary | ICD-10-CM | POA: Insufficient documentation

## 2015-04-26 DIAGNOSIS — R002 Palpitations: Secondary | ICD-10-CM | POA: Insufficient documentation

## 2015-04-26 MED ORDER — TECHNETIUM TC 99M SESTAMIBI GENERIC - CARDIOLITE
33.0000 | Freq: Once | INTRAVENOUS | Status: AC | PRN
  Administered 2015-04-26: 33 via INTRAVENOUS

## 2015-04-26 MED ORDER — REGADENOSON 0.4 MG/5ML IV SOLN
0.4000 mg | Freq: Once | INTRAVENOUS | Status: AC
  Administered 2015-04-26: 0.4 mg via INTRAVENOUS

## 2015-04-27 ENCOUNTER — Ambulatory Visit (HOSPITAL_COMMUNITY): Payer: 59 | Attending: Cardiovascular Disease

## 2015-04-27 LAB — MYOCARDIAL PERFUSION IMAGING
LV dias vol: 59 mL
LV sys vol: 26 mL
Peak HR: 106 {beats}/min
RATE: 0.36
Rest HR: 86 {beats}/min
SDS: 6
SRS: 3
SSS: 9
TID: 1.03

## 2015-04-27 MED ORDER — TECHNETIUM TC 99M SESTAMIBI GENERIC - CARDIOLITE
32.4000 | Freq: Once | INTRAVENOUS | Status: AC | PRN
  Administered 2015-04-27: 32 via INTRAVENOUS

## 2015-04-28 ENCOUNTER — Ambulatory Visit (INDEPENDENT_AMBULATORY_CARE_PROVIDER_SITE_OTHER): Payer: 59 | Admitting: Family Medicine

## 2015-04-28 ENCOUNTER — Ambulatory Visit (INDEPENDENT_AMBULATORY_CARE_PROVIDER_SITE_OTHER): Payer: 59

## 2015-04-28 VITALS — BP 124/88 | HR 120 | Temp 98.8°F | Resp 18 | Ht 67.75 in | Wt 262.6 lb

## 2015-04-28 DIAGNOSIS — M79601 Pain in right arm: Secondary | ICD-10-CM

## 2015-04-28 DIAGNOSIS — R0981 Nasal congestion: Secondary | ICD-10-CM

## 2015-04-28 DIAGNOSIS — Z79899 Other long term (current) drug therapy: Secondary | ICD-10-CM | POA: Diagnosis not present

## 2015-04-28 DIAGNOSIS — M5412 Radiculopathy, cervical region: Secondary | ICD-10-CM | POA: Diagnosis not present

## 2015-04-28 MED ORDER — CYCLOBENZAPRINE HCL 5 MG PO TABS: ORAL_TABLET | ORAL | Status: DC

## 2015-04-28 MED ORDER — OXYCODONE-ACETAMINOPHEN 5-325 MG PO TABS: 1.0000 | ORAL_TABLET | Freq: Four times a day (QID) | ORAL | Status: DC | PRN

## 2015-04-28 MED ORDER — PREDNISONE 20 MG PO TABS
ORAL_TABLET | ORAL | Status: DC
Start: 2015-04-28 — End: 2015-05-12

## 2015-04-28 MED ORDER — OMEPRAZOLE 20 MG PO CPDR: 20.0000 mg | DELAYED_RELEASE_CAPSULE | Freq: Every day | ORAL | Status: DC

## 2015-04-28 NOTE — Progress Notes (Addendum)
Subjective:  By signing my name below, I, Rawaa Al Rifaie, attest that this documentation has been prepared under the direction and in the presence of [name], MD.  Broadus John, Medical Scribe. 04/28/2015.  1:06 PM.   Patient ID: Deborah Soto, female    DOB: [birth year], 50 y.o.   MRN: [id number]  Chief Complaint  Patient presents with  . Shoulder Pain    right shoulder pain on going for x 2 weeks  . sinus congestion    with some sinus pressure    HPI HPI Comments: Deborah Soto is a 50 y.o. female who presents to [facility] complaining of right shoulder pain, gradual onset 2 weeks ago.  She reports that her symptoms first started the same time as her cardiac symptoms about 4-6 weeks ago. She indicates that her symptoms started with numbness and tingling of the back and front of the arm with occasional numbness of the whole hand. Pt reports that her symptoms were tolerable initially, however have greatly worsened this week with the associated sharp, stabbing, tearing pain down the arm, and mild pain of the shoulder and neck. She took Tylenol since it is the only medication that she can take, however she took 3 dosages o percocet yesterday, prescribed for her hx of PE. Pt is not currently on any muscle relaxants. Pt has not taken any medications today. Pt started seeing a chiropractor 2 weeks ago for this. She is right-handed dominant. She denies any traumatic injuries to the area, weakness of the area, fevers, or any previous neck history.  Pt is also complaining of possible sinusitis, onset yesterday. Pt reports having associated symptoms of sinus pressure, BL nasal congestion with green colored mucus, and mild cough. She has not taken any medications for this. Pt reports no sick contact. She denies fever, or post nasal drip.   She indicates that she still has shortness of breath, and palpitation. Most recent Ct scan of the chest was on 01/31. She is  followed up with cardio for this. Pt had a stress test yesterday.    Patient Active Problem List   Diagnosis Date Noted  . Hepatic steatosis 04/18/2015  . Acute pulmonary embolism (HCC) 03/23/2015  . Acute pulmonary embolus (HCC) 03/23/2015  . Left sided chest pain   . Dyspnea   . Achilles tendonitis 04/08/2012  . 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 PTE (pulmonary thromboembolism) (HCC) 10/10/2011  . Preeclampsia 10/10/2011  . Diverticulitis 10/10/2011  . Bipolar affective disorder (HCC) 10/10/2011  . BMI 34.0-34.9,adult 10/10/2011  . AR (allergic rhinitis) 10/10/2011   Past Medical History  Diagnosis Date  . Depression   . Embolism - blood clot     legs & lung   Past Surgical History  Procedure Laterality Date  . No past surgeries     No Known Allergies Prior to Admission medications   Medication Sig Start Date End Date Taking? Authorizing Provider  Brexpiprazole (REXULTI) 1 MG TABS Take 1 tablet by mouth daily.   Yes Historical Provider, MD  Clindamycin-Benzoyl Per, Refr, gel Apply 1 application topically 2 (two) times daily. Reported on 03/31/2015 03/10/15  Yes Historical Provider, MD  escitalopram (LEXAPRO) 20 MG tablet Take 20 mg by mouth at bedtime.  03/10/15  Yes Historical Provider, MD  oxyCODONE-acetaminophen (PERCOCET/ROXICET) 5-325 MG tablet Take 2 tablets by mouth every 4 (four) hours as needed for severe pain. 04/13/15  Yes Samantha Tripp Dowless, PA-C  rivaroxaban (XARELTO) 20 MG TABS tablet Take  20 mg by mouth daily with supper.   Yes Historical Provider, MD   Social History   Social History  . Marital Status: Married    Spouse Name: N/A  . Number of Children: N/A  . Years of Education: N/A   Occupational History  . Not on file.   Social History Main Topics  . Smoking status: Former Smoker -- 1.00 packs/day for 25 years    Quit date: 03/12/2004  . Smokeless tobacco: Not on file  . Alcohol Use: No  . Drug Use: No  . Sexual Activity: Not on file   Other  Topics Concern  . Not on file   Social History Narrative    Review of Systems  Constitutional: Negative for fever.  HENT: Positive for congestion and sinus pressure. Negative for postnasal drip.   Respiratory: Positive for cough.   Musculoskeletal: Positive for myalgias, arthralgias and neck pain.  Neurological: Positive for numbness. Negative for weakness.      Objective:   Physical Exam  Constitutional: She is oriented to person, place, and time. She appears well-developed and well-nourished. No distress.  HENT:  Head: Normocephalic and atraumatic.  Right Ear: Hearing, tympanic membrane, external ear and ear canal normal.  Left Ear: Hearing, tympanic membrane, external ear and ear canal normal.  Nose: Nose normal.  Mouth/Throat: Oropharynx is clear and moist. No oropharyngeal exudate.  Right maxillary sinuses minimal tenderness.  Edematous turbinates BL.   Eyes: Conjunctivae and EOM are normal. Pupils are equal, round, and reactive to light.  Neck: Neck supple.  Cardiovascular: Regular rhythm, normal heart sounds and intact distal pulses.  Tachycardia present.   No murmur heard. Slightly tachycardic.  Pulmonary/Chest: Effort normal and breath sounds normal. No respiratory distress. She has no wheezes. She has no rhonchi. She has no rales.  Few faint coarse breath sound on the eft lower lobe, cleared with repeated exam. Otherwise clear.   Musculoskeletal:  On trapezius on the right. Slight spasm on the midline bone, otherwise no midline bony tenderness.  AC, clavicle, and [place] non tender. FROM on the right shoulder. Full rotator cuff strength. Decreased extension, decreased right rotation of the neck.   Neurological: She is alert and oriented to person, place, and time. No cranial nerve deficit.  Skin: Skin is warm and dry. No rash noted.  Psychiatric: She has a normal mood and affect. Her behavior is normal.  Nursing note and vitals reviewed.   Filed Vitals:   04/28/15 1238    BP: 124/88  Pulse: 120  Temp: 98.8 F (37.1 C)  TempSrc: Oral  Resp: 18  Height: 5' 7.75" (1.721 m)  Weight: 262 lb 9.6 oz (119.115 kg)  SpO2: 98%    04/28/2015  CLINICAL DATA:  Neck pain. Right cervical radiculopathy and right arm pain. EXAM: CERVICAL SPINE - 2-3 VIEW COMPARISON:  None. FINDINGS: There is no evidence of cervical spine fracture or prevertebral soft tissue swelling. Alignment is normal. Mild degenerative disc disease is seen from levels of C3-C6. Moderate to severe facet DJD is seen bilaterally at most cervical levels, right side greater than left. IMPRESSION: No acute findings.  Degenerative spondylosis, as described above. Electronically Signed   By: Myles Rosenthal M.D.   On: 04/28/2015 13:34      Assessment & Plan:   Deborah Soto is a 50 y.o. female Right cervical radiculopathy - Plan: DG Cervical Spine 2 or 3 views, predniSONE (DELTASONE) 20 MG tablet, oxyCODONE-acetaminophen (ROXICET) 5-325 MG tablet, cyclobenzaprine (FLEXERIL) 5 MG tablet  Right arm pain - Plan: DG Cervical Spine 2 or 3 views, predniSONE (DELTASONE) 20 MG tablet, oxyCODONE-acetaminophen (ROXICET) 5-325 MG tablet, cyclobenzaprine (FLEXERIL) 5 MG tablet  -Symptoms appear consistent with a right cervical radiculopathy. Reflexes are maintained, strength is maintained. Deferred MRI at this time  -try prednisone taper, omeprazole for gastric protection as currently on blood thinner with PE.  -Hydrocodone prescription given for breakthrough pain, but can also try Flexeril 5 mg 3 times a day. Side effects discussed of using both Flexeril and hydrocodone.   RTC/ER precautions discussed including if unable to achieve pain control with current regimen.  Sinus congestion  Based on timing of symptoms, doubt bacterial sinusitis. Saline nasal spray/symptomatic care discussed. RTC precautions   High risk medication use - Plan: omeprazole (PRILOSEC) 20 MG capsule as above while taking prednisone.   Meds ordered  this encounter  Medications  . predniSONE (DELTASONE) 20 MG tablet    Sig: 3 by mouth for 3 days, then 2 by mouth for 2 days, then 1 by mouth for 2 days, then 1/2 by mouth for 2 days.    Dispense:  16 tablet    Refill:  0  . oxyCODONE-acetaminophen (ROXICET) 5-325 MG tablet    Sig: Take 1-2 tablets by mouth every 6 (six) hours as needed for severe pain.    Dispense:  15 tablet    Refill:  0  . cyclobenzaprine (FLEXERIL) 5 MG tablet    Sig: 1 pill by mouth up to every 8 hours as needed. Start with one pill by mouth each bedtime as needed due to sedation    Dispense:  15 tablet    Refill:  0  . omeprazole (PRILOSEC) 20 MG capsule    Sig: Take 1 capsule (20 mg total) by mouth daily.    Dispense:  15 capsule    Refill:  0   Patient Instructions  Because you received an x-ray today, you will receive an invoice from [facility]. Please contact [facility] at [phone] with questions or concerns regarding your invoice. Our billing staff will not be able to assist you with those questions.   I suspect you have a pinched nerve from your right neck that is causing the pain down your arm. Start with the Percocet 1-2 every 6 hours today, Flexeril up to every 8 hours as needed for muscle spasm, heat or ice to the affected area and rest today. Start the prednisone 3 pills today, and omeprazole once per day while you're taking the prednisone to lessen risk of stomach bleeding. If pain is not improving today with this treatment, be seen through emergency room. If any worsening symptoms or spread of symptoms, be seen here or the emergency room.   your sinus congestion is likely due to an upper respiratory infection/virus at this point.  I do not think that you have a sinus infection currently, but start saline nasal spray, drink plenty of fluids, and if this is not improving into next week, would consider antibiotics at that time. Return sooner if worse.   Cervical  Radiculopathy Cervical radiculopathy happens when a nerve in the neck (cervical nerve) is pinched or bruised. This condition can develop because of an injury or as part of the normal aging process. Pressure on the cervical nerves can cause pain or numbness that runs from the neck all the way down into the arm and fingers. Usually, this condition gets better with rest. Treatment may be needed if the condition does not improve.  CAUSES This condition may be caused by:  Injury.  Slipped (herniated) disk.  Muscle tightness in the neck because of overuse.  Arthritis.  Breakdown or degeneration in the bones and joints of the spine (spondylosis) due to aging.  Bone spurs that may develop near the cervical nerves. SYMPTOMS Symptoms of this condition include:  Pain that runs from the neck to the arm and hand. The pain can be severe or irritating. It may be worse when the neck is moved.  Numbness or weakness in the affected arm and hand. DIAGNOSIS This condition may be diagnosed based on symptoms, medical history, and a physical exam. You may also have tests, including:  X-rays.  CT scan.  MRI.  Electromyogram (EMG).  Nerve conduction tests. TREATMENT In many cases, treatment is not needed for this condition. With rest, the condition usually gets better over time. If treatment is needed, options may include:  Wearing a soft neck collar for short periods of time.  Physical therapy to strengthen your neck muscles.  Medicines, such as NSAIDs, oral corticosteroids, or spinal injections.  Surgery. This may be needed if other treatments do not help. Various types of surgery may be done depending on the cause of your problems. HOME CARE INSTRUCTIONS Managing Pain  Take over-the-counter and prescription medicines only as told by your health care provider.  If directed, apply ice to the affected area.  Put ice in a plastic bag.  Place a towel between your skin and the bag.  Leave  the ice on for 20 minutes, 2-3 times per day.  If ice does not help, you can try using heat. Take a warm shower or warm bath, or use a heat pack as told by your health care provider.  Try a gentle neck and shoulder massage to help relieve symptoms. Activity  Rest as needed. Follow instructions from your health care provider about any restrictions on activities.  Do stretching and strengthening exercises as told by your health care provider or physical therapist. General Instructions  If you were given a soft collar, wear it as told by your health care provider.  Use a flat pillow when you sleep.  Keep all follow-up visits as told by your health care provider. This is important. SEEK MEDICAL CARE IF:  Your condition does not improve with treatment. SEEK IMMEDIATE MEDICAL CARE IF:  Your pain gets much worse and cannot be controlled with medicines.  You have weakness or numbness in your hand, arm, face, or leg.  You have a high fever.  You have a stiff, rigid neck.  You lose control of your bowels or your bladder (have incontinence).  You have trouble with walking, balance, or speaking.   This information is not intended to replace advice given to you by your health care provider. Make sure you discuss any questions you have with your health care provider.   Document Released: 11/21/2000 Document Revised: 11/17/2014 Document Reviewed: 04/22/2014 Elsevier Interactive Patient Education [employer]     I personally performed the services described in this documentation, which was scribed in my presence. The recorded information has been reviewed and considered, and addended by me as needed.

## 2015-04-28 NOTE — Patient Instructions (Addendum)
Because you received an x-ray today, you will receive an invoice from [facility]. Please contact [facility] at [phone] with questions or concerns regarding your invoice. Our billing staff will not be able to assist you with those questions.   I suspect you have a pinched nerve from your right neck that is causing the pain down your arm. Start with the Percocet 1-2 every 6 hours today, Flexeril up to every 8 hours as needed for muscle spasm, heat or ice to the affected area and rest today. Start the prednisone 3 pills today, and omeprazole once per day while you're taking the prednisone to lessen risk of stomach bleeding. If pain is not improving today with this treatment, be seen through emergency room. If any worsening symptoms or spread of symptoms, be seen here or the emergency room.   your sinus congestion is likely due to an upper respiratory infection/virus at this point.  I do not think that you have a sinus infection currently, but start saline nasal spray, drink plenty of fluids, and if this is not improving into next week, would consider antibiotics at that time. Return sooner if worse.   Cervical Radiculopathy Cervical radiculopathy happens when a nerve in the neck (cervical nerve) is pinched or bruised. This condition can develop because of an injury or as part of the normal aging process. Pressure on the cervical nerves can cause pain or numbness that runs from the neck all the way down into the arm and fingers. Usually, this condition gets better with rest. Treatment may be needed if the condition does not improve.  CAUSES This condition may be caused by:  Injury.  Slipped (herniated) disk.  Muscle tightness in the neck because of overuse.  Arthritis.  Breakdown or degeneration in the bones and joints of the spine (spondylosis) due to aging.  Bone spurs that may develop near the cervical nerves. SYMPTOMS Symptoms of this condition include:  Pain that  runs from the neck to the arm and hand. The pain can be severe or irritating. It may be worse when the neck is moved.  Numbness or weakness in the affected arm and hand. DIAGNOSIS This condition may be diagnosed based on symptoms, medical history, and a physical exam. You may also have tests, including:  X-rays.  CT scan.  MRI.  Electromyogram (EMG).  Nerve conduction tests. TREATMENT In many cases, treatment is not needed for this condition. With rest, the condition usually gets better over time. If treatment is needed, options may include:  Wearing a soft neck collar for short periods of time.  Physical therapy to strengthen your neck muscles.  Medicines, such as NSAIDs, oral corticosteroids, or spinal injections.  Surgery. This may be needed if other treatments do not help. Various types of surgery may be done depending on the cause of your problems. HOME CARE INSTRUCTIONS Managing Pain  Take over-the-counter and prescription medicines only as told by your health care provider.  If directed, apply ice to the affected area.  Put ice in a plastic bag.  Place a towel between your skin and the bag.  Leave the ice on for 20 minutes, 2-3 times per day.  If ice does not help, you can try using heat. Take a warm shower or warm bath, or use a heat pack as told by your health care provider.  Try a gentle neck and shoulder massage to help relieve symptoms. Activity  Rest as needed. Follow instructions from your health care provider about any  restrictions on activities.  Do stretching and strengthening exercises as told by your health care provider or physical therapist. General Instructions  If you were given a soft collar, wear it as told by your health care provider.  Use a flat pillow when you sleep.  Keep all follow-up visits as told by your health care provider. This is important. SEEK MEDICAL CARE IF:  Your condition does not improve with treatment. SEEK  IMMEDIATE MEDICAL CARE IF:  Your pain gets much worse and cannot be controlled with medicines.  You have weakness or numbness in your hand, arm, face, or leg.  You have a high fever.  You have a stiff, rigid neck.  You lose control of your bowels or your bladder (have incontinence).  You have trouble with walking, balance, or speaking.   This information is not intended to replace advice given to you by your health care provider. Make sure you discuss any questions you have with your health care provider.   Document Released: 11/21/2000 Document Revised: 11/17/2014 Document Reviewed: 04/22/2014 Elsevier Interactive Patient Education [employer]

## 2015-05-04 ENCOUNTER — Other Ambulatory Visit: Payer: Self-pay | Admitting: Chiropractic Medicine

## 2015-05-04 ENCOUNTER — Telehealth: Payer: Self-pay

## 2015-05-04 DIAGNOSIS — M542 Cervicalgia: Secondary | ICD-10-CM

## 2015-05-04 NOTE — Telephone Encounter (Signed)
Pt states she was given PERCOCET and have taken all her meds, but the pain is still back, would like to know the next step and need to have something else called in. States the [place] knocks her out Please call [phone]    [place]

## 2015-05-05 NOTE — Telephone Encounter (Signed)
Dr. Greene  Please see previous message 

## 2015-05-06 MED ORDER — HYDROCODONE-ACETAMINOPHEN 5-325 MG PO TABS: 1.0000 | ORAL_TABLET | Freq: Four times a day (QID) | ORAL | Status: DC | PRN

## 2015-05-06 NOTE — Telephone Encounter (Signed)
Will refer to ortho to decide next step as no relief with prednisone. Can Rx Lortab 5/325mg  1-2 Q 6h prn #30.  Stop the percocet. Will route this to PA pool as well as I am out of office for few days to see if they can Rx the Lortab to pick up. Thanks.

## 2015-05-06 NOTE — Telephone Encounter (Signed)
Script filled per Dr. Paralee Cancel instructions. Patient notified by VM.

## 2015-05-09 ENCOUNTER — Other Ambulatory Visit: Payer: Self-pay | Admitting: Internal Medicine

## 2015-05-10 ENCOUNTER — Ambulatory Visit
Admission: RE | Admit: 2015-05-10 | Discharge: 2015-05-10 | Disposition: A | Discharge status: Home / Self Care | Payer: 59 | Source: Ambulatory Visit | Attending: Chiropractic Medicine | Admitting: Chiropractic Medicine

## 2015-05-10 DIAGNOSIS — M542 Cervicalgia: Secondary | ICD-10-CM

## 2015-05-11 ENCOUNTER — Other Ambulatory Visit: Payer: 59

## 2015-05-12 ENCOUNTER — Encounter: Payer: Self-pay | Admitting: Pulmonary Disease

## 2015-05-12 ENCOUNTER — Ambulatory Visit (INDEPENDENT_AMBULATORY_CARE_PROVIDER_SITE_OTHER): Payer: 59 | Admitting: Pulmonary Disease

## 2015-05-12 VITALS — BP 124/76 | HR 108 | Ht 68.0 in | Wt 261.0 lb

## 2015-05-12 DIAGNOSIS — I2699 Other pulmonary embolism without acute cor pulmonale: Secondary | ICD-10-CM | POA: Diagnosis not present

## 2015-05-12 DIAGNOSIS — M5010 Cervical disc disorder with radiculopathy, unspecified cervical region: Secondary | ICD-10-CM | POA: Insufficient documentation

## 2015-05-12 DIAGNOSIS — R0600 Dyspnea, unspecified: Secondary | ICD-10-CM

## 2015-05-12 NOTE — Progress Notes (Signed)
Subjective:    Patient ID: Deborah Soto, female    DOB: 11-21-1965, 50 y.o.   MRN: [id number]  HPI Patient was discharged from hospital on 03/24/15 with acute pulmonary embolism. Patient previously had a DVT with pulmonary embolus at the age of 49. She was treated with Coumadin with intermittent breaks during pregnancy. Critical care and interventional radiology were consulted but recommended against lytic therapy. Patient was treated with a heparin infusion and transitioned to Xarelto. Multiple family members with clots and reports that her mother & sister have had extensive workups at [facility] without finding a cause. Previously was seen by Dr. Cyndie Chime with her initial PE/DVT in 1997. She reports she started having breathing problems around Labor Day 2016 with increased dyspnea on exertion. She had attributed it to weight gain at that time. She reports dyspnea with going up one flight of stairs. She reports that after Christmas she went to Urgent Care and was evaluated without finding a cause of her dyspnea. She developed pain in her left flank a couple of weeks later. She reports it was a "stabbing, tearing, sharp" pain in her flank with a pleuritic component. She reports frequent tightness in her chest and intermittent palpitations. She has been evaluated by cardiology since her discharge from hospital. She reports intermittent, nonproductive cough. Denies any wheezing. She does have occasional near syncope. Reports rare reflux with an episode of significant dyspepsia that resolved spontaneously recently. No other significant dyspepsia or morning brash water taste. She reports she has had allergy shots as a child. She reports mild sinus congestion & pressure with weather changes. No nausea, emesis, or diarrhea recently. No abdominal pain recently. No hematuria, hematochezia or melena. No miscarriages. She reports chronic pain in her upper shoulder/arm from a bulging cervical disc. No other joint  swelling or erythema.   Review of Systems No rashes or abnormal bruising. No adenopathy in her neck, groin, or axilla. A pertinent 14 point review of systems is negative except as per the history of presenting illness.   No Known Allergies  Current Outpatient Prescriptions on File Prior to Visit  Medication Sig Dispense Refill  . Clindamycin-Benzoyl Per, Refr, gel Apply 1 application topically 2 (two) times daily. Reported on 03/31/2015    . cyclobenzaprine (FLEXERIL) 5 MG tablet 1 pill by mouth up to every 8 hours as needed. Start with one pill by mouth each bedtime as needed due to sedation 15 tablet 0  . escitalopram (LEXAPRO) 20 MG tablet Take 20 mg by mouth at bedtime.     Marland Kitchen HYDROcodone-acetaminophen (NORCO/VICODIN) 5-325 MG tablet Take 1-2 tablets by mouth every 6 (six) hours as needed for moderate pain. 30 tablet 0  . oxyCODONE-acetaminophen (ROXICET) 5-325 MG tablet Take 1-2 tablets by mouth every 6 (six) hours as needed for severe pain. 15 tablet 0  . XARELTO 20 MG TABS tablet TAKE 1 TABLET DAILY WITH SUPPER. START ON 22ND DAY. 30 tablet 0  . Brexpiprazole (REXULTI) 1 MG TABS Take 1 tablet by mouth daily.     No current facility-administered medications on file prior to visit.    Past Medical History  Diagnosis Date  . Depression   . Embolism - blood clot January 1997 & January 2017    Also had LLE DVT in 1997    Past Surgical History  Procedure Laterality Date  . No past surgeries      Family History  Problem Relation Age of Onset  . Dementia Father   .  Heart disease Father   . Clotting disorder Mother   . Arthritis Mother   . Clotting disorder Sister   . Clotting disorder Maternal Grandmother   . Clotting disorder Maternal Aunt   . Rheumatologic disease Neg Hx     Social History   Social History  . Marital Status: Married    Spouse Name: N/A  . Number of Children: Y  . Years of Education: N/A   Occupational History  . admin assist    Social History Main  Topics  . Smoking status: Former Smoker -- 1.00 packs/day for 25 years    Quit date: 03/12/2004  . Smokeless tobacco: None  . Alcohol Use: 0.0 oz/week    0 Standard drinks or equivalent per week     Comment: 1-2 times a week  . Drug Use: No  . Sexual Activity: Not Asked   Other Topics Concern  . None   Social History Narrative   Originally from [place]. Always lived in [place]. 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 No international travel recently. Previously has been to [place] in May 1996. No mold exposure recently. No bird exposure. Does have multiple pets.       Objective:   Physical Exam BP 124/76 mmHg  Pulse 108  Ht  (1.727 m)  Wt 261 lb (118.389 kg)  BMI 39.69 kg/m2  SpO2 97%  LMP 04/11/2015 General:  Awake. Alert. No acute distress. Obese.  Integument:  Warm & dry. No rash on exposed skin. No bruising. Lymphatics:  No appreciated cervical or supraclavicular lymphadenoapthy. HEENT:  Moist mucus membranes. No oral ulcers. No scleral injection or icterus. Moderate bilateral nasal turbinate swelling on room air. Cardiovascular:  Regular rate. No edema. No appreciable JVD.  Pulmonary:  Good aeration & clear to auscultation bilaterally. Symmetric chest wall expansion. No accessory muscle use. Abdomen: Soft. Normal bowel sounds. Protuberant. Grossly nontender. Musculoskeletal:  Normal bulk and tone. Hand grip strength 5/5 bilaterally. No joint deformity or effusion appreciated. Neurological:  CN 2-12 grossly in tact. No meningismus. Moving all 4 extremities equally. Symmetric brachioradialis deep tendon reflexes. Psychiatric:  Mood and affect congruent. Speech normal rhythm, rate & tone.   IMAGING BILAT LE VENOUS DUPLEX (03/24/15): No DVT or SVT.  CTA CHEST 03/23/15 (reviewed by me): No opacity or effusion within the parenchyma. Mild dependent atelectasis. Occlusive pulmonary embolus identified throughout left lower lobe pulmonary arterial tree. RV/LV ratio 1.2. No hilar  or mediastinal adenopathy.  CARDIAC  TTE (03/23/15): LV normal in size with mild LVH. EF 60-65%. No regional wall motion abnormalities. LA & RA normal in size. RV normal in size and function. Pulmonary artery normal in size. No aortic stenosis or regurgitation. No mitral stenosis or regurgitation. Trivial pulmonic regurgitation without stenosis. Trivial tricuspid regurgitation. No pericardial effusion  LABS 04/18/15 CBC: 7.4/13.1/39.1/307 BMP: 144/3.9/104/27/9/0.9/113/9.4  03/24/15 CBC: 10.4/12.7/39.2/266 Troponin I:  <0.03 BMP: 141/3.7/105/26/13/0.67/170/8.8 LFT: 3.2/5.7/0.3/49/20/19 INR: 1.16    Assessment & Plan:  50 year old Caucasian female with morbid obesity and recent acute pulmonary embolism in January 2017. This appears again to be unprovoked. Patient did have a DVT in 1997 as well as a pulmonary embolus at that time. There are multiple female family members with history of clots without any clotting disorder found with reported extensive workup. I do not believe the patient's pulmonary embolus is contributing to her ongoing symptoms of dyspnea on exertion. I do question whether or not she could potentially have underlying lung disease either COPD or asthma given her history  of allergies that could be contributing to her symptoms. I instructed the patient contact my office for any further questions or concerns before next appointment otherwise we will review her testing at that time.  1. 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 Referring to hematology for further evaluation and recommendations. Continuing systemic anticoagulation with Xarelto indefinitely. Patient cautioned against the risk of bleeding on systemic anticoagulation. 2. Dyspnea: Unclear etiology. Checking 6 minute walk test on room air and full pulmonary function testing before next appointment. 3. Follow-up: Patient to return to clinic in 3-4 weeks.  Donna Christen Jamison Neighbor, M.D. [facility] Pager:  [phone] After 3pm or if no response, call [phone] 2:12 PM 05/12/2015

## 2015-05-12 NOTE — Patient Instructions (Signed)
   Continue taking your Xarelto as prescribed  We are going to do a breathing and walking test before your next appointment  I am referring you to a hematologist to see if there is additional testing that they would recommend.  I will see back in 3-4 weeks  TESTS ORDERED: 1. Full pulmonary function testing before next appointment 2. 6 minute walk test on room air before next appointment

## 2015-05-14 ENCOUNTER — Other Ambulatory Visit: Payer: 59

## 2015-05-16 ENCOUNTER — Ambulatory Visit (INDEPENDENT_AMBULATORY_CARE_PROVIDER_SITE_OTHER): Payer: 59 | Admitting: Internal Medicine

## 2015-05-16 ENCOUNTER — Telehealth: Payer: Self-pay

## 2015-05-16 VITALS — BP 146/94 | HR 109 | Temp 98.5°F | Resp 18 | Ht 68.0 in | Wt 264.2 lb

## 2015-05-16 DIAGNOSIS — M5010 Cervical disc disorder with radiculopathy, unspecified cervical region: Secondary | ICD-10-CM

## 2015-05-16 DIAGNOSIS — M5412 Radiculopathy, cervical region: Secondary | ICD-10-CM | POA: Diagnosis not present

## 2015-05-16 DIAGNOSIS — M79601 Pain in right arm: Secondary | ICD-10-CM | POA: Diagnosis not present

## 2015-05-16 DIAGNOSIS — I2699 Other pulmonary embolism without acute cor pulmonale: Secondary | ICD-10-CM

## 2015-05-16 MED ORDER — OXYCODONE-ACETAMINOPHEN 5-325 MG PO TABS: 1.0000 | ORAL_TABLET | Freq: Four times a day (QID) | ORAL | Status: DC | PRN

## 2015-05-16 NOTE — Telephone Encounter (Addendum)
Pt states she is to have a procedure from Dr Rooks SinkIbacaba's office and they are sending a report to see if pt can stop the Blood Thinner, really need to have done today if possible Please cal [phone]

## 2015-05-16 NOTE — Progress Notes (Addendum)
Subjective:  By signing my name below, I, Stann Oresung-Kai Tsai, attest that this documentation has been prepared under the direction and in the presence of Ellamae Siaobert Dorismar Chay, MD. Electronically Signed: Stann Oresung-Kai Tsai, Scribe. 05/16/2015 , 4:57 PM .  Patient was seen in Room 9 .   Patient ID: Deborah Soto, female    DOB: 20-Mar-1965, 50 y.o.   MRN: [id number] Chief Complaint  Patient presents with  . to talk about meds.    She want to cut down on xarelto   HPI Deborah Soto is a 50 y.o. female who [facility] medication discussion, as she wants to discuss management of xarelto during c-spine injection.  Note PTE#2 1/17 and then persistent SOB/DOE. She had a stress test done a few days ago=WNL. She has seen Dr. Jamison NeighborNestor at [facility] pulmonology, and will do PFTs and a 6-minute walk in 3 days. Her recent MRI revealed moderate to prominent impingement at c3-4, c4-5, and c5-6 and moderate impingement at c6-7. She is markedly symptomatic at L5-6 R. She saw Dr. Maurice SmallIbazebo today and wants to do an injection; however, he didn't want to do an injection while she's on the xarelto. Dr. Maurice SmallIbazebo also prescribed gabapentin for hs use. hydrocod not working well.  Patient Active Problem List   Diagnosis Date Noted  . Cervical disc disorder with radiculopathy of cervical region 05/12/2015  . Hepatic steatosis 04/18/2015  . Acute pulmonary embolism (HCC) 03/23/2015  . Acute pulmonary embolus (HCC) 03/23/2015  . Left sided chest pain   . Dyspnea   . Achilles tendonitis 04/08/2012  . 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 PTE (pulmonary thromboembolism) (HCC) 10/10/2011  . Preeclampsia 10/10/2011  . Diverticulitis 10/10/2011  . Bipolar affective disorder (HCC) 10/10/2011  . BMI 34.0-34.9,adult 10/10/2011  . AR (allergic rhinitis) 10/10/2011    Current outpatient prescriptions:  .  Brexpiprazole (REXULTI) 1 MG TABS, Take 1 tablet by mouth daily., Disp: , Rfl:  .  Clindamycin-Benzoyl Per, Refr, gel, Apply 1  application topically 2 (two) times daily. Reported on 03/31/2015, Disp: , Rfl:  .  escitalopram (LEXAPRO) 20 MG tablet, Take 20 mg by mouth at bedtime. , Disp: , Rfl:  .  HYDROcodone-acetaminophen (NORCO/VICODIN) 5-325 MG tablet, Take 1-2 tablets by mouth every 6 (six) hours as needed for moderate pain., Disp: 30 tablet, Rfl: 0 .  XARELTO 20 MG TABS tablet, TAKE 1 TABLET DAILY WITH SUPPER. START ON 22ND DAY., Disp: 30 tablet, Rfl: 0 .  cyclobenzaprine (FLEXERIL) 5 MG tablet, 1 pill by mouth up to every 8 hours as needed. Start with one pill by mouth each bedtime as needed due to sedation (Patient not taking: Reported on 05/16/2015), Disp: 15 tablet, Rfl: 0 .  oxyCODONE-acetaminophen (ROXICET) 5-325 MG tablet, Take 1-2 tablets by mouth every 6 (six) hours as needed for severe pain. (Patient not taking: Reported on 05/16/2015), Disp: 15 tablet, Rfl: 0  Review of Systems  Constitutional: Negative for fever, chills and fatigue.  Gastrointestinal: Negative for nausea, vomiting and diarrhea.  Musculoskeletal: Positive for myalgias, arthralgias and neck pain. Negative for back pain, joint swelling and gait problem.  Skin: Negative for rash and wound.  Neurological: Positive for numbness.      Objective:   Physical Exam  Constitutional: She is oriented to person, place, and time. She appears well-developed and well-nourished. No distress.  HENT:  Head: Normocephalic and atraumatic.  Eyes: EOM are normal. Pupils are equal, round, and reactive to light.  Cardiovascular: Normal rate.   Pulmonary/Chest: Effort normal.  No respiratory distress.  Musculoskeletal: Normal range of motion.  Neurological: She is alert and oriented to person, place, and time.  Skin: Skin is warm and dry.  Psychiatric: She has a normal mood and affect. Her behavior is normal.  Nursing note and vitals reviewed.  BP 146/94 mmHg  Pulse 109  Temp(Src) 98.5 F (36.9 C) (Oral)  Resp 18  Ht  (1.727 m)  Wt 264 lb 3.2 oz  (119.84 kg)  BMI 40.18 kg/m2  SpO2 98%  LMP 04/11/2015    Assessment & Plan:  I have completed the patient encounter in its entirety as documented by the scribe, with editing by me where necessary. Kenyada Hy P. Merla Riches, M.D.    Right cervical radiculopathy - Plan: oxyCODONE-acetaminophen (ROXICET) 5-325 MG tablet added for pain relief  PTE (pulmonary thromboembolism) (HCC) on xarelto 20/d   Will consult with Dr Myna Hidalgo about best approach  Addend 3/7 Dr Myna Hidalgo says only 48 hrs off xarelto/no need for lovenox bridge, then restart xarelto the next day Please cal [phone] patient's# to inform her and call Dr Hessie Diener office to give them the word

## 2015-05-16 NOTE — Telephone Encounter (Signed)
Pt is here now to see Dr. Merla Richesoolittle.

## 2015-05-17 ENCOUNTER — Telehealth: Payer: Self-pay | Admitting: Internal Medicine

## 2015-05-17 NOTE — Telephone Encounter (Signed)
-----   Message from Josph MachoPeter R Ennever, MD sent at 05/17/2015  6:37 AM EST ----- Rob:  I think that the Xarelto can be stopped 2 days before the procedure.  She does NOT need any Lovenox for "bridging."  I would then re-start the Xarelto the day after the procedure. This should be adequate for anti-coagulation.  Cindee LamePete ----- Message -----    From: Tonye Pearsonobert P Khrista Braun, MD    Sent: 05/16/2015   5:51 PM      To: Josph MachoPeter R Ennever, MD  I think Dr Jamison NeighborNestor just referred her to you-to consider lifelong anticoag or not-coag studies 03/23/15 she has been my patient off and on since her first PTE '97-- Now acute cervical radiculopathy and Dr Larrie KassIbezabo wants to do spinal injection for pain relief and is asking me for advice on xarelto. Sounds like off 5 days at least and seems should be covered by Lovenox since last PTE 03/2015. ? 1/kg bid or 1.5/kg daily---and hold lovenox 12hours before procedure--what do you advise???

## 2015-05-18 ENCOUNTER — Encounter: Payer: Self-pay | Admitting: Hematology

## 2015-05-18 ENCOUNTER — Ambulatory Visit (HOSPITAL_COMMUNITY)
Admission: RE | Admit: 2015-05-18 | Discharge: 2015-05-18 | Disposition: A | Discharge status: Home / Self Care | Payer: 59 | Source: Ambulatory Visit | Attending: Pulmonary Disease | Admitting: Pulmonary Disease

## 2015-05-18 ENCOUNTER — Telehealth: Payer: Self-pay | Admitting: Hematology

## 2015-05-18 DIAGNOSIS — I2699 Other pulmonary embolism without acute cor pulmonale: Secondary | ICD-10-CM | POA: Diagnosis not present

## 2015-05-18 DIAGNOSIS — R0600 Dyspnea, unspecified: Secondary | ICD-10-CM | POA: Diagnosis present

## 2015-05-18 LAB — PULMONARY FUNCTION TEST
DL/VA % pred: 71 %
DL/VA: 3.75 ml/min/mmHg/L
DLCO unc % pred: 69 %
DLCO unc: 20.69 ml/min/mmHg
FEF 25-75 Post: 3.99 L/sec
FEF 25-75 Pre: 3.53 L/sec
FEF2575-%Change-Post: 12 %
FEF2575-%Pred-Post: 129 %
FEF2575-%Pred-Pre: 114 %
FEV1-%Change-Post: 5 %
FEV1-%Pred-Post: 101 %
FEV1-%Pred-Pre: 96 %
FEV1-Post: 3.31 L
FEV1-Pre: 3.13 L
FEV1FVC-%Change-Post: 2 %
FEV1FVC-%Pred-Pre: 103 %
FEV6-%Change-Post: 3 %
FEV6-%Pred-Post: 96 %
FEV6-%Pred-Pre: 93 %
FEV6-Post: 3.86 L
FEV6-Pre: 3.74 L
FEV6FVC-%Pred-Post: 102 %
FEV6FVC-%Pred-Pre: 102 %
FVC-%Change-Post: 3 %
FVC-%Pred-Post: 94 %
FVC-%Pred-Pre: 91 %
FVC-Post: 3.86 L
FVC-Pre: 3.74 L
Post FEV1/FVC ratio: 86 %
Post FEV6/FVC ratio: 100 %
Pre FEV1/FVC ratio: 84 %
Pre FEV6/FVC Ratio: 100 %
RV % pred: 88 %
RV: 1.71 L
TLC % pred: 97 %
TLC: 5.51 L

## 2015-05-18 MED ORDER — ALBUTEROL SULFATE (2.5 MG/3ML) 0.083% IN NEBU
2.5000 mg | INHALATION_SOLUTION | Freq: Once | RESPIRATORY_TRACT | Status: AC
  Administered 2015-05-18: 2.5 mg via RESPIRATORY_TRACT

## 2015-05-18 NOTE — Telephone Encounter (Signed)
Scheduled Pt from [facility] to Dr. Mosetta PuttFeng. Faxed New Pt Ref to Providing Office and Sent out New Pt Packet to Pt. Set Intake Reminder for 3/16

## 2015-05-19 ENCOUNTER — Encounter: Payer: Self-pay | Admitting: Hematology

## 2015-05-23 ENCOUNTER — Telehealth: Payer: Self-pay | Admitting: Pulmonary Disease

## 2015-05-23 NOTE — Telephone Encounter (Signed)
Pt had PFT on 05/18/15. Pt wants to know if she needs additional testing to be done. Please advise Dr. Jamison NeighborNestor thanks

## 2015-05-24 NOTE — Telephone Encounter (Signed)
No additional testing at this time. She is already scheduled for a at her followup with me later this month. JN.

## 2015-05-24 NOTE — Telephone Encounter (Signed)
Left message for patient to call back  

## 2015-05-25 NOTE — Telephone Encounter (Signed)
Spoke with pt. She is aware that she does not need any further testing. Nothing further was needed.

## 2015-05-25 NOTE — Telephone Encounter (Signed)
Pt returned call through answering service.Caren GriffinsStanley A Dalton

## 2015-05-31 ENCOUNTER — Ambulatory Visit (HOSPITAL_BASED_OUTPATIENT_CLINIC_OR_DEPARTMENT_OTHER): Payer: 59 | Admitting: Hematology

## 2015-05-31 ENCOUNTER — Other Ambulatory Visit: Payer: 59

## 2015-05-31 ENCOUNTER — Encounter: Payer: Self-pay | Admitting: Hematology

## 2015-05-31 DIAGNOSIS — I2699 Other pulmonary embolism without acute cor pulmonale: Secondary | ICD-10-CM

## 2015-05-31 DIAGNOSIS — Z7901 Long term (current) use of anticoagulants: Secondary | ICD-10-CM

## 2015-05-31 DIAGNOSIS — Z86718 Personal history of other venous thrombosis and embolism: Secondary | ICD-10-CM

## 2015-05-31 DIAGNOSIS — Z832 Family history of diseases of the blood and blood-forming organs and certain disorders involving the immune mechanism: Secondary | ICD-10-CM | POA: Diagnosis not present

## 2015-05-31 NOTE — Progress Notes (Addendum)
[facility]   Telephone:(336) [phone] Fax:(336) [phone]   Clinic New Consult Note   Patient Care Team: Tonye Pearsonobert P Doolittle, MD as PCP - General (Family Medicine) 05/31/2015   REFERRAL PHYSICIAN: Dr. Lawanda CousinsJennings Nestor   CHIEF COMPLAINTS/PURPOSE OF CONSULTATION:  Pulmonary embolism  HISTORY OF PRESENTING ILLNESS:  Deborah BoardMichelle Soto 50 y.o. female with past medical history of PE/DVT, is here because of her recent pulmonary embolism. She was referred by her pulmonologist Dr. Jamison NeighborNestor. She presents to my clinic by herself today  She presented to Hospital with worsening dyspnea for a few months, acute chest pain on 04/12/2015, and found to have submassive PE. She was started on heparin drip. Echo normal. Lower extremity duplex negative for DVT. She was discharged home on Xarelto.   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 Patient has significant family history of PE (mother, grandmother, and two sisters). [facility] but couldn't identify the cause. Patient has previously been followed by Hematology Dr. Tamsen MeekGrafutuna and was on coumadin for years after having DVT with PE at the age of 50, she was on OCP and smoked. Her coumadin was changed to lovenox during her pregnancy, and she was on A/C for 8 years, she came off coumadin on her own.   She complains about pain at left outside thigh for a few weeks now, persistent but worse when she walks, 5/10, no edema, she takes oxycodone some time for chronic neck pain   MEDICAL HISTORY:  Past Medical History  Diagnosis Date   Depression    Embolism - blood clot January 1997 & January 2017    Also had LLE DVT in 1997    SURGICAL HISTORY: Past Surgical History  Procedure Laterality Date   No past surgeries      SOCIAL HISTORY: Social History   Social History   Marital Status: Married    Spouse Name: N/A   Number of Children: Y   Years of  Education: N/A   Occupational History   admin assist    Social History Main Topics   Smoking status: Former Smoker -- 1.00 packs/day for 25 years    Quit date: 03/12/2004   Smokeless tobacco: Not on file   Alcohol Use: 0.0 oz/week    0 Standard drinks or equivalent per week     Comment: 1-2 times a week   Drug Use: No   Sexual Activity: Not on file   Other Topics Concern   Not on file   Social History Narrative   Originally from [place]. Always lived in [place]. 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 No international travel recently. Previously has been to [place] in May [place]. No mold exposure recently. No bird exposure. Does have multiple pets.     FAMILY HISTORY: Family History  Problem Relation Age of Onset   Dementia Father    Heart disease Father    Clotting disorder Mother    Arthritis Mother    Clotting disorder Sister    Clotting disorder Maternal Grandmother    Clotting disorder Maternal Aunt    Rheumatologic disease Neg Hx     ALLERGIES:  has No Known Allergies.  MEDICATIONS:  Current Outpatient Prescriptions  Medication Sig Dispense Refill   Brexpiprazole (REXULTI) 1 MG TABS Take 1 tablet by mouth daily.     Clindamycin-Benzoyl Per, Refr, gel Apply 1 application topically 2 (two) times daily. Reported on 03/31/2015  cyclobenzaprine (FLEXERIL) 5 MG tablet 1 pill by mouth up to every 8 hours as needed. Start with one pill by mouth each bedtime as needed due to sedation 15 tablet 0   escitalopram (LEXAPRO) 20 MG tablet Take 20 mg by mouth at bedtime.      oxyCODONE-acetaminophen (ROXICET) 5-325 MG tablet Take 1-2 tablets by mouth every 6 (six) hours as needed for severe pain. 45 tablet 0   XARELTO 20 MG TABS tablet TAKE 1 TABLET DAILY WITH SUPPER. START ON 22ND DAY. 30 tablet 0   diazepam (VALIUM) 5 MG tablet      gabapentin (NEURONTIN) 300 MG capsule      HYDROcodone-acetaminophen (NORCO/VICODIN) 5-325 MG tablet Take 1-2 tablets by mouth every 6 (six) hours  as needed for moderate pain. (Patient not taking: Reported on 05/31/2015) 30 tablet 0   No current facility-administered medications for this visit.    REVIEW OF SYSTEMS:   Constitutional: Denies fevers, chills or abnormal night sweats Eyes: Denies blurriness of vision, double vision or watery eyes Ears, nose, mouth, throat, and face: Denies mucositis or sore throat Respiratory: Denies cough, dyspnea or wheezes Cardiovascular: Denies palpitation, chest discomfort or lower extremity swelling Gastrointestinal:  Denies nausea, heartburn or change in bowel habits Skin: Denies abnormal skin rashes Lymphatics: Denies new lymphadenopathy or easy bruising Neurological:Denies numbness, tingling or new weaknesses Behavioral/Psych: Mood is stable, no new changes  All other systems were reviewed with the patient and are negative.  PHYSICAL EXAMINATION: ECOG PERFORMANCE STATUS: 1 - Symptomatic but completely ambulatory  Filed Vitals:   There were no vitals filed for this visit.  GENERAL:alert, no distress and comfortable SKIN: skin color, texture, turgor are normal, no rashes or significant lesions EYES: normal, conjunctiva are pink and non-injected, sclera clear OROPHARYNX:no exudate, no erythema and lips, buccal mucosa, and tongue normal  NECK: supple, thyroid normal size, non-tender, without nodularity LYMPH:  no palpable lymphadenopathy in the cervical, axillary or inguinal LUNGS: clear to auscultation and percussion with normal breathing effort HEART: regular rate & rhythm and no murmurs and no lower extremity edema ABDOMEN:abdomen soft, non-tender and normal bowel sounds Musculoskeletal:no cyanosis of digits and no clubbing  PSYCH: alert & oriented x 3 with fluent speech NEURO: no focal motor/sensory deficits  LABORATORY DATA:  I have reviewed the data as listed CBC Latest Ref Rng 04/18/2015 04/12/2015 03/31/2015  WBC 4.0 - 10.5 K/uL 7.4 10.1 9.7  Hemoglobin 12.0 - 15.0 g/dL 78.2 95.6  21.3  Hematocrit 36.0 - 46.0 % 39.1 42.8 38.2  Platelets 150 - 400 K/uL 307 318 -    CMP Latest Ref Rng 04/18/2015 04/12/2015 03/24/2015  Glucose 65 - 99 mg/dL 086(V) 784(O) 962(X)  BUN 7 - 25 mg/dL 9 10 13   Creatinine 0.50 - 1.10 mg/dL 5.28 4.13(K) 4.40  Sodium 135 - 146 mmol/L 144 141 141  Potassium 3.5 - 5.3 mmol/L 3.9 3.7 3.7  Chloride 98 - 110 mmol/L 104 105 105  CO2 20 - 31 mmol/L 27 23 26   Calcium 8.6 - 10.2 mg/dL 9.4 9.8 1.0(U)  Total Protein 6.5 - 8.1 g/dL - - 5.7(L)  Total Bilirubin 0.3 - 1.2 mg/dL - - 0.3  Alkaline Phos 38 - 126 U/L - - 49  AST 15 - 41 U/L - - 20  ALT 14 - 54 U/L - - 19   Lupus anticoagulant panel  Status: Edited Result - FINAL Visible to patient:  MyChart Next appt: Today at 11:15 AM in Oncology Mosetta Putt, [place], MD)  Ref Range 55mo ago     PTT Lupus Anticoagulant 0.0 - 40.6 sec 75.5 (H)    DRVVT 0.0 - 44.0 sec 46.6 (H)    Lupus Anticoag Interp  Comment:VC    Comments: (NOTE)  No lupus anticoagulant was detected. The PTT-LA and PTT-LA mixing  results are consistent with the presence of one or more specific  inhibitors of intrinsic pathway factors. Correction of the dRVVT  mixing study was also noted.  This can be caused by a deficiency of  one of the common pathway factors (X, V, II or fibrinogen).         RADIOLOGY REPORT   CT CHEST PE 03/23/2015 IMPRESSION: Positive for acute PE with CT evidence of right heart strain (RV/LV Ratio = 1.2) consistent with at least submassive (intermediate risk) PE. 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   Plan  Hypercoagulopathy workup We discussed about the pros and cons about testing for thrombophilia disorder. Her current anticoagulation therapy will interfere with some the tests and it is not possible to interpret the test results.  Taking her off the  anticoagulation therapy to do the tests may precipitate another thrombotic event. However, due to her strong family history of thrombosis, and further resolve her children, I think is reasonable to test inheritable genetic mutations for thrombophilia, including antithrombin III, protein S and C level, factor V Leyden mutation, and a prothrombin gene mutation.  Duration of anticoagulation Giving the recurrent episodes and severity of her thrombosis, I recommend continue Xarelto indefinitely, if no bleeding complications or contraindications.   Anticoagulation options We discussed about various options of anticoagulation therapies including warfarin, low molecular weight heparin such as Lovenox or newer agents such as Rivaroxaban. Some of the risks and benefits discussed including costs involved, the need for monitoring, risks of life-threatening bleeding/hospitalization, reversibility of each agent in the event of bleeding or overdose, safety profile of each drug and taking into account other social issues such as ease of administration of medications, etc. Ultimately, we have made an informed decision for the patient to continue her treatment with Xarelto.  Other preventive strategy for DVT  I recommend the patient to use elastic compression stockings at 20-30 mmHg to reduce risks of chronic thrombophlebitis.  Finally, at the end of our consultation today, I reinforced the importance of preventive strategies such as avoiding hormonal supplement, avoiding cigarette smoking, keeping up-to-date with screening programs for early cancer detection, frequent ambulation for long distance travel and aggressive DVT prophylaxis in all surgical settings.  Family counseling Another main issue we discussed today included the role of screening other family members for thrombophilia disorder. If her hypercarbic Aloxi workup turns out to be positive, and her children are interested in genetic testing, then that would  be reasonable.  Follow up -lab today, I will call her next week to review her lab results  -continue xerelota indefinitily  -due to her left thigh pain, I will order a Doppler study of low extremities to rule out DVT -I'll see her only as needed. She'll follow-up with her primary care physician and pulmonologist for anticoagulation.  All questions were answered. The patient knows to call the clinic with any problems, questions or concerns. I spent 40 minutes counseling the patient face to face. The total time spent in the appointment was 55 minutes and more than 50% was on counseling.  Malachy Mood, MD 05/31/2015 12:24 PM

## 2015-06-01 ENCOUNTER — Other Ambulatory Visit: Payer: Self-pay | Admitting: Hematology

## 2015-06-01 ENCOUNTER — Telehealth: Payer: Self-pay | Admitting: Hematology

## 2015-06-01 DIAGNOSIS — R52 Pain, unspecified: Secondary | ICD-10-CM

## 2015-06-01 LAB — PROTEIN C, TOTAL: Protein C Antigen: 89 % (ref 60–150)

## 2015-06-01 NOTE — Telephone Encounter (Signed)
Patient called today and per patient she was seen by YF yesterday and it was her understanding that she should have an US of her leg asap, however there no was no order available. Upon checking 3/21 pof - per pof doppler asap. Currently there is no order available for this test. Patient forwarded to desk nurse to leave message. I also spoke with desk nurse today re this message and there being no order.

## 2015-06-02 ENCOUNTER — Ambulatory Visit (HOSPITAL_COMMUNITY)
Admission: RE | Admit: 2015-06-02 | Discharge: 2015-06-02 | Disposition: A | Discharge status: Home / Self Care | Payer: 59 | Source: Ambulatory Visit | Attending: Internal Medicine | Admitting: Internal Medicine

## 2015-06-02 DIAGNOSIS — M79652 Pain in left thigh: Secondary | ICD-10-CM | POA: Diagnosis present

## 2015-06-02 DIAGNOSIS — Z86711 Personal history of pulmonary embolism: Secondary | ICD-10-CM | POA: Diagnosis not present

## 2015-06-02 DIAGNOSIS — R52 Pain, unspecified: Secondary | ICD-10-CM | POA: Diagnosis not present

## 2015-06-02 DIAGNOSIS — Z7901 Long term (current) use of anticoagulants: Secondary | ICD-10-CM | POA: Diagnosis not present

## 2015-06-02 NOTE — Progress Notes (Signed)
*  Preliminary Results* Bilateral lower extremity venous duplex completed. Bilateral lower extremities are negative for deep vein thrombosis. There is no evidence of Baker's cyst bilaterally.  06/02/2015  Gertie FeyMichelle Haylee Mcanany, RVT, RDCS, RDMS

## 2015-06-03 ENCOUNTER — Ambulatory Visit (INDEPENDENT_AMBULATORY_CARE_PROVIDER_SITE_OTHER): Payer: 59 | Admitting: Pulmonary Disease

## 2015-06-03 ENCOUNTER — Other Ambulatory Visit: Payer: Self-pay | Admitting: Internal Medicine

## 2015-06-03 VITALS — BP 130/80 | HR 92 | Ht 68.0 in | Wt 264.6 lb

## 2015-06-03 DIAGNOSIS — R0600 Dyspnea, unspecified: Secondary | ICD-10-CM | POA: Diagnosis not present

## 2015-06-03 DIAGNOSIS — I2699 Other pulmonary embolism without acute cor pulmonale: Secondary | ICD-10-CM

## 2015-06-03 NOTE — Progress Notes (Signed)
Subjective:    Patient ID: Deborah Soto, female    DOB: 1965/07/19, 50 y.o.   MRN: [id number]  C.C.:  Follow-up for Dyspnea & Recurrent PE.  HPI Dyspnea:  She reports she "has to take deep breaths" just sitting. She reports it is an effort to breathe. She does have significant dyspnea on exertion. She denies any cough. No wheezing.   Recurrent PE: Referred to hematology at last appointment & saw Dr. Mosetta PuttFeng. Hypercoagulable workup ordered at her appointment last week. Compliant with Xarelto. She did stop it from Tuesday through Saturday of last week to get an epidural injection for a bulging disc. No syncope or near syncope.   Review of Systems She reports she still has an abnormal sensation in her chest anteriorly. She reports she does have occasional "stabbing" pain with deep inspiration similar to her prior PE pain. No fever, chills, or sweats.   No Known Allergies  Current Outpatient Prescriptions on File Prior to Visit  Medication Sig Dispense Refill  . Brexpiprazole (REXULTI) 1 MG TABS Take 1 tablet by mouth daily.    . Clindamycin-Benzoyl Per, Refr, gel Apply 1 application topically 2 (two) times daily. Reported on 03/31/2015    . escitalopram (LEXAPRO) 20 MG tablet Take 20 mg by mouth at bedtime.     . gabapentin (NEURONTIN) 300 MG capsule Take 300 mg by mouth 3 (three) times daily.     Marland Kitchen. oxyCODONE-acetaminophen (ROXICET) 5-325 MG tablet Take 1-2 tablets by mouth every 6 (six) hours as needed for severe pain. 45 tablet 0  . XARELTO 20 MG TABS tablet TAKE 1 TABLET DAILY WITH SUPPER. START ON 22ND DAY. 30 tablet 0   No current facility-administered medications on file prior to visit.    Past Medical History  Diagnosis Date  . Depression   . Embolism - blood clot January 1997 & January 2017    Also had LLE DVT in 1997    Past Surgical History  Procedure Laterality Date  . No past surgeries      Family History  Problem Relation Age of Onset  . Dementia Father   . Heart  disease Father   . Clotting disorder Mother   . Arthritis Mother   . Clotting disorder Sister   . Clotting disorder Maternal Grandmother   . Clotting disorder Maternal Aunt   . Rheumatologic disease Neg Hx     Social History   Social History  . Marital Status: Married    Spouse Name: N/A  . Number of Children: Y  . Years of Education: N/A   Occupational History  . admin assist    Social History Main Topics  . Smoking status: Former Smoker -- 1.00 packs/day for 20 years    Quit date: 03/12/2004  . Smokeless tobacco: Not on file  . Alcohol Use: 0.6 oz/week    0 Standard drinks or equivalent, 1 Cans of beer per week     Comment: 1-2 times a week  . Drug Use: No  . Sexual Activity: Not on file   Other Topics Concern  . Not on file   Social History Narrative   Originally from [place]. Always lived in [place]. 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 No international travel recently. Previously has been to [place] in May 1996. No mold exposure recently. No bird exposure. Does have multiple pets.       Objective:   Physical Exam BP 130/80 mmHg  Pulse 92  Ht 5\' 8"  (1.727 m)  Wt 264 lb 9.6 oz (120.022 kg)  BMI 40.24 kg/m2  SpO2 98% General:  Awake. Alert. Obese.  Integument:  Warm & dry. No rash on exposed skin.  Lymphatics:  No appreciated cervical or supraclavicular lymphadenoapthy. HEENT:  Moist mucus membranes. No oral ulcers. Moderate bilateral nasal turbinate swelling unchanged. Cardiovascular:  Regular rate. No edema. No appreciable JVD.  Pulmonary:  Clear bilaterally to auscultation. Symmetric chest wall expansion. Normal work of breathing on room air. Abdomen: Soft. Normal bowel sounds. Protuberant. Grossly nontender.  PFT 05/20/15: FVC 3.74 L (91%) FEV1 3.13 L (96%) FEV1/FVC 0.84 FEF 25-75 3.53 L (114%) no bronchodilator response TLC 5.51 L (97%) RV 88% ERV 30% DLCO uncorrected 69%  06/03/15:  Walked 464 meters / Baseline Sat 98% on RA / Nadir Sat 98% on  RA  IMAGING BILAT LE VENOUS DUPLEX (03/24/15): No DVT or SVT.  CTA CHEST 03/23/15 (previously reviewed by me): No opacity or effusion within the parenchyma. Mild dependent atelectasis. Occlusive pulmonary embolus identified throughout left lower lobe pulmonary arterial tree. RV/LV ratio 1.2. No hilar or mediastinal adenopathy.  CARDIAC  TTE (03/23/15): LV normal in size with mild LVH. EF 60-65%. No regional wall motion abnormalities. LA & RA normal in size. RV normal in size and function. Pulmonary artery normal in size. No aortic stenosis or regurgitation. No mitral stenosis or regurgitation. Trivial pulmonic regurgitation without stenosis. Trivial tricuspid regurgitation. No pericardial effusion  LABS 04/18/15 CBC: 7.4/13.1/39.1/307 BMP: 144/3.9/104/27/9/0.9/113/9.4  03/24/15 CBC: 10.4/12.7/39.2/266 Troponin I:  <0.03 BMP: 141/3.7/105/26/13/0.67/170/8.8 LFT: 3.2/5.7/0.3/49/20/19 INR: 1.16    Assessment & Plan:  50 year old Caucasian female with morbid obesity and recent acute pulmonary embolism in January 2017. Patient currently being evaluated with serum workup for hypercoagulable state given her recurrent pulmonary emboli. She is continuing on systemic anticoagulation. I reviewed the results of her pulmonary function testing which shows no significant bronchodilator response or evidence of airway obstruction. Her lung volumes were normal. Her carbon monoxide diffusion capacity uncorrected for hemoglobin was mildly decreased and quite probably secondary to her prior pulmonary emboli. Despite this her walk test showed no significant desaturation taking me more suspicious for an airway dysfunction such as asthma. We did discuss cardiopulmonary exercise stress testing but pending the result of her methacholine challenge test I will not order this at this time. I instructed the patient contact my office if she had any new breathing problems before next appointment.  1. Dyspnea: Unclear etiology but  possibly asthma. Checking methacholine challenge test ASAP. If this is negative plan for cardiopulmonary exercise testing. Patient has never been on Singulair or inhaled corticosteroid. These would be my first treatment options. 2. Recurrent pulmonary embolism: Referred to hematology for evaluation. Currently awaiting results of hypercoagulable workup. Currently on systemic anticoagulation with Xarelto. No evidence of right ventricular dysfunction. I would favor consideration of lifelong anticoagulation but will defer to hematology. 3. Follow-up: Patient to return to clinic in 4-6 weeks or sooner if needed.  Donna Christen Jamison Neighbor, M.D. [facility] Pager:  [phone] After 3pm or if no response, call [phone] 11:56 AM 06/03/2015

## 2015-06-03 NOTE — Patient Instructions (Signed)
   We will schedule you for the methacholine challenge test to see if you have underlying asthma  If you're methacholine challenge test is negative we will then proceed to cardiopulmonary exercise testing  I will see you back in 4-6 weeks  TESTS ORDERED: 1. Methacholine challenge test ASAP

## 2015-06-03 NOTE — Progress Notes (Signed)
Test reviewed.  

## 2015-06-04 LAB — ANTITHROMBIN III: Antithrombin Activity: 144 % — ABNORMAL HIGH (ref 75–135)

## 2015-06-04 LAB — PROTEIN C ACTIVITY: Protein C-Functional: 152 % (ref 73–180)

## 2015-06-04 LAB — PROTEIN S ACTIVITY: Protein S-Functional: 205 % — ABNORMAL HIGH (ref 63–140)

## 2015-06-04 LAB — PROTEIN S, ANTIGEN, FREE: Protein S, Free: 122 % (ref 57–157)

## 2015-06-05 ENCOUNTER — Ambulatory Visit (HOSPITAL_COMMUNITY): Admission: EM | Admit: 2015-06-05 | Payer: 59 | Admitting: Pulmonary Disease

## 2015-06-05 ENCOUNTER — Ambulatory Visit (HOSPITAL_COMMUNITY)
Admission: RE | Admit: 2015-06-05 | Discharge: 2015-06-05 | Disposition: A | Discharge status: Home / Self Care | Payer: 59 | Source: Ambulatory Visit | Attending: Family Medicine | Admitting: Family Medicine

## 2015-06-05 ENCOUNTER — Encounter (HOSPITAL_COMMUNITY): Payer: Self-pay

## 2015-06-05 ENCOUNTER — Encounter: Payer: Self-pay | Admitting: Hematology

## 2015-06-05 ENCOUNTER — Telehealth: Payer: Self-pay | Admitting: Radiology

## 2015-06-05 ENCOUNTER — Ambulatory Visit (INDEPENDENT_AMBULATORY_CARE_PROVIDER_SITE_OTHER): Payer: 59 | Admitting: Family Medicine

## 2015-06-05 ENCOUNTER — Ambulatory Visit (HOSPITAL_COMMUNITY)
Admission: RE | Admit: 2015-06-05 | Discharge: 2015-06-05 | Disposition: A | Discharge status: Home / Self Care | Payer: 59 | Source: Ambulatory Visit | Attending: Pulmonary Disease | Admitting: Pulmonary Disease

## 2015-06-05 VITALS — BP 126/88 | HR 116 | Temp 99.9°F | Resp 18 | Ht 68.0 in | Wt 265.0 lb

## 2015-06-05 DIAGNOSIS — R062 Wheezing: Secondary | ICD-10-CM

## 2015-06-05 DIAGNOSIS — R768 Other specified abnormal immunological findings in serum: Secondary | ICD-10-CM | POA: Diagnosis not present

## 2015-06-05 DIAGNOSIS — R7689 Other specified abnormal immunological findings in serum: Secondary | ICD-10-CM

## 2015-06-05 DIAGNOSIS — R0789 Other chest pain: Secondary | ICD-10-CM

## 2015-06-05 HISTORY — DX: Unspecified asthma, uncomplicated: J45.909

## 2015-06-05 MED ORDER — PREDNISONE 20 MG PO TABS
ORAL_TABLET | ORAL | Status: DC
Start: 2015-06-05 — End: 2015-06-29

## 2015-06-05 MED ORDER — IOPAMIDOL (ISOVUE-370) INJECTION 76%
INTRAVENOUS | Status: AC
  Administered 2015-06-05: 100 mL
  Filled 2015-06-05: qty 100

## 2015-06-05 MED ORDER — ALBUTEROL SULFATE (2.5 MG/3ML) 0.083% IN NEBU
2.5000 mg | INHALATION_SOLUTION | Freq: Once | RESPIRATORY_TRACT | Status: AC
  Administered 2015-06-05: 2.5 mg via RESPIRATORY_TRACT

## 2015-06-05 MED ORDER — ALBUTEROL SULFATE 108 (90 BASE) MCG/ACT IN AEPB: 2.0000 | INHALATION_SPRAY | Freq: Four times a day (QID) | RESPIRATORY_TRACT | Status: DC | PRN

## 2015-06-05 MED ORDER — IPRATROPIUM BROMIDE 0.02 % IN SOLN
0.5000 mg | Freq: Once | RESPIRATORY_TRACT | Status: AC
  Administered 2015-06-05: 0.5 mg via RESPIRATORY_TRACT

## 2015-06-05 NOTE — Progress Notes (Addendum)
By signing my name below, I, Stann Ore, attest that this documentation has been prepared under the direction and in the presence of Elvina Sidle, MD. Electronically Signed: Stann Ore, Scribe. 06/05/2015 , 9:29 AM .  Patient was seen in room 10 .   Patient ID: Deborah Soto MRN: [id number], DOB: 26-Mar-1965, 50 y.o. Date of Encounter: 06/05/2015  Primary Physician: Tonye Pearson, MD  Chief Complaint:  Chief Complaint  Patient presents with  . Cough    x 3 days  . Shortness of Breath  . Nasal Congestion    HPI:  Deborah Soto is a 50 y.o. female who presents to [facility] complaining of cough with shortness of breath and nasal congestion for the past 2 days. She's been having chest tightness with occasional "stabbing" pain with deep inspiration. She notes that she has to take deep breaths without activity. She mentions that her pulmonologist wants to do a methacholine challenge test, but she is concerned about it.   She has a history of PE's for 20 years, most recent one in January 2017. She's on xarelto .   Past Medical History  Diagnosis Date  . Depression   . Embolism - blood clot January 1997 & January 2017    Also had LLE DVT in 1997     Home Meds: Prior to Admission medications   Medication Sig Start Date End Date Taking? Authorizing Provider  Brexpiprazole (REXULTI) 1 MG TABS Take 1 tablet by mouth daily.   Yes Historical Provider, MD  Clindamycin-Benzoyl Per, Refr, gel Apply 1 application topically 2 (two) times daily. Reported on 03/31/2015 03/10/15  Yes Historical Provider, MD  escitalopram (LEXAPRO) 20 MG tablet Take 20 mg by mouth at bedtime.  03/10/15  Yes Historical Provider, MD  gabapentin (NEURONTIN) 300 MG capsule Take 300 mg by mouth 3 (three) times daily.  05/16/15  Yes Historical Provider, MD  oxyCODONE-acetaminophen (ROXICET) 5-325 MG tablet Take 1-2 tablets by mouth every 6 (six) hours as needed for severe pain.  05/16/15  Yes Tonye Pearson, MD  XARELTO 20 MG TABS tablet TAKE 1 TABLET DAILY WITH SUPPER. START ON 22ND DAY. 05/12/15  Yes Tonye Pearson, MD    Allergies: No Known Allergies  Social History   Social History  . Marital Status: Married    Spouse Name: N/A  . Number of Children: Y  . Years of Education: N/A   Occupational History  . admin assist    Social History Main Topics  . Smoking status: Former Smoker -- 1.00 packs/day for 20 years    Quit date: 03/12/2004  . Smokeless tobacco: Not on file  . Alcohol Use: 0.6 oz/week    0 Standard drinks or equivalent, 1 Cans of beer per week     Comment: 1-2 times a week  . Drug Use: No  . Sexual Activity: Not on file   Other Topics Concern  . Not on file   Social History Narrative   Originally from [place]. Always lived in [place]. 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 No international travel recently. Previously has been to [place] in May 1996. No mold exposure recently. No bird exposure. Does have multiple pets.      Review of Systems: Constitutional: negative for fever, chills, night sweats, weight changes, or fatigue  HEENT: negative for vision changes, hearing loss, congestion, rhinorrhea, ST, epistaxis, or sinus pressure; positive for congestion Cardiovascular: negative for chest pain or palpitations Respiratory: negative for hemoptysis, wheezing;  positive for cough, shortness of breath Abdominal: negative for abdominal pain, nausea, vomiting, diarrhea, or constipation Dermatological: negative for rash Neurologic: negative for headache, dizziness, or syncope All other systems reviewed and are otherwise negative with the exception to those above and in the HPI.  Physical Exam:  Blood pressure 126/88, pulse 116, temperature 99.9 F (37.7 C), resp. rate 18, height 5\' 8"  (1.727 m), weight 265 lb (120.203 kg), last menstrual period 06/05/2015, SpO2 97 %., Body mass index is 40.3 kg/(m^2). General: Well developed, well  nourished, in no acute distress. Head: Normocephalic, atraumatic, eyes without discharge, sclera non-icteric, nares are without discharge. Bilateral auditory canals clear, TM's are without perforation, pearly grey and translucent with reflective cone of light bilaterally. Oral cavity moist, posterior pharynx without exudate, erythema, peritonsillar abscess, or post nasal drip.  Neck: Supple. No thyromegaly. Full ROM. No lymphadenopathy. Lungs: Clear bilaterally to auscultation without rales, or rhonchi. Bilateral wheezing Heart: Tachycardic (~120), No murmurs, rubs, or gallops appreciated. Abdomen: Soft, non-tender, non-distended with normoactive bowel sounds. No hepatomegaly. No rebound/guarding. No obvious abdominal masses. Msk:  Strength and tone normal for age. Extremities/Skin: Warm and dry. No clubbing or cyanosis. No edema. No rashes or suspicious lesions. Neuro: Alert and oriented X 3. Moves all extremities spontaneously. Gait is normal. CNII-XII grossly in tact. Psych:  Responds to questions appropriately with a normal affect.   Labs:I reviewed the labs of the last several months. Patient's had positive d-dimer in January and March. She's also had a positive AMA done recently in January.  ASSESSMENT AND PLAN:  50 y.o. year old female with recurring chest tightness, chronic tachycardia, positive history of pulmonary emboli, positive testing for lupus.  I spent one half hour reviewing patient's chart face-to-face with patient and reviewing her symptoms. She has chronic depression, difficulty losing weight, joint pains and myalgia, and, of course, the chronic respiratory symptoms and tachycardia. This chart was scribed in my presence and reviewed by me personally.    ICD-9-CM ICD-10-CM   1. Wheezing 786.07 R06.2 albuterol (PROVENTIL) (2.5 MG/3ML) 0.083% nebulizer solution 2.5 mg     ipratropium (ATROVENT) nebulizer solution 0.5 mg     CT Angio Chest W/Cm &/Or Wo Cm     Ambulatory  referral to Rheumatology  2. Chest tightness 786.59 R07.89 albuterol (PROVENTIL) (2.5 MG/3ML) 0.083% nebulizer solution 2.5 mg     ipratropium (ATROVENT) nebulizer solution 0.5 mg     CT Angio Chest W/Cm &/Or Wo Cm     Ambulatory referral to Rheumatology  3. Positive ANA (antinuclear antibody) 795.79 R76.8 Ambulatory referral to Rheumatology     Signed, Elvina SidleKurt Quincie Haroon, MD   Signed, Elvina SidleKurt Dale Ribeiro, MD 06/05/2015 9:29 AM

## 2015-06-05 NOTE — Patient Instructions (Addendum)
Go to [facility] for your CT Scan/ you will register as an outpatient in the Emergency room, but tell them you are there for CT Scan only  Driving directions to [id number]  [phone]  - more info    [address]  [place], [place] [address]     1. Head south on [address] toward [address]      0.5 mi    2. Sharp left onto [address]      0.6 mi    3. Turn left onto the [address] ramp      0.2 mi    4. Merge onto [address]      3.0 mi    5. Continue straight to stay on [address]      0.4 mi    6. Slight left to stay on [address]      1.2 mi    7. Turn right onto [address]      0.1 mi    8. Turn left onto [address]      361 ft    9. Take the 1st left onto [address]  Destination will be on the right       Systemic Lupus Erythematosus, Adult Systemic lupus erythematosus is a long-term (chronic) disease that can affect many parts of the body. It can damage the skin, joints, blood vessels, brain, kidneys, lungs, heart, and other internal organs. It causes pain, irritation, and inflammation. Systemic lupus erythematosus is an autoimmune disease. With this type of disease, the body's defense system (immune system) mistakenly attacks normal tissues instead of attacking germs or abnormal growths. CAUSES The cause of this condition is not known. RISK FACTORS This condition is more likely to develop in:  Females.  People of Asian descent.  People of African-American descent.  People who have a family history of the condition. SYMPTOMS General symptoms include:  Joint pain and swelling (common).  Fever.  Fatigue.  Unusual weight loss or weight gain.  Skin rashes, especially over the nose and cheeks (butterfly rash) and after sun exposure.  Sores inside the mouth or nose. Other symptoms depend on which parts of the body are affected. They can include:  Shortness of breath.  Chest  pain.  Frequent urination.  Blood in the urine.  Seizures.  Mental changes.  Hair loss.  Swollen and tender lymph nodes.  Swelling of the hands or feet. Symptoms can come and go. A period of time when symptoms get worse or come back is called a flare. A period of time with no symptoms is called a remission. DIAGNOSIS This condition is diagnosed based on symptoms, a medical history, and a physical exam. You may also have tests, including:  Blood tests.  Urine tests.  A chest X-ray.  A skin or kidney biopsy. For this test, a sample of tissue is taken from the skin or kidney and studied under a microscope. You may be referred to an autoimmune disease specialist (rheumatologist). TREATMENT There is no cure for this condition, but treatment can keep the disease in remission, help to control symptoms, and prevent damage to the heart, lungs, kidneys, and other organs. Treatment may involve taking a combination of medicines over time. HOME CARE INSTRUCTIONS Medicines  Take medicines only as directed by your health care provider.  Do not take any medicines that contain estrogen without first checking with  your health care provider. Estrogen can trigger flares and may increase your risk for blood clots. Lifestyle  Eat a heart-healthy diet.  Stay active as directed by your health care provider.  Do not smoke. If you need help quitting, ask your health care provider.  Protect your skin from the sun by applying sunblock and wearing protective hats and clothing.  Learn as much as you can about your condition and have a good support system in place. Support may come from family, friends, or a lupus support group. General Instructions  Keep all follow-up visits as directed by your health care provider. This is important.  Work closely with all of your health care providers to manage your condition.  Let your health care provider know right away if you become pregnant or if you  plan to become pregnant. Pregnancy in women with this condition is considered high risk. SEEK MEDICAL CARE IF:  You have a fever.  Your symptoms flare.  You develop new symptoms.  You develop swollen feet or hands.  You develop puffiness around your eyes.  Your medicines are not working.  You have bloody, foamy, or coffee-colored urine.  There are changes in your urination. For example, you urinate more often at night.  You think that you may be depressed or have anxiety. SEEK IMMEDIATE MEDICAL CARE IF:  You have chest pain.  You have trouble breathing.  You have a seizure.  You suddenly get a very bad headache.  You suddenly develop facial or body weakness.  You cannot speak.  You cannot understand speech.   This information is not intended to replace advice given to you by your health care provider. Make sure you discuss any questions you have with your health care provider.   Document Released: 02/16/2002 Document Revised: 07/13/2014 Document Reviewed: 02/03/2014 Elsevier Interactive Patient Education [employer]

## 2015-06-05 NOTE — Telephone Encounter (Signed)
Spoke to CT took call report of CT chest / discussed with Dr Milus GlazierLauenstein. Advised  Patient meds sent to pharmacy.

## 2015-06-05 NOTE — Addendum Note (Signed)
Addended by: Malachy MoodFENG, Venise Ellingwood on: 06/05/2015 10:57 PM   Modules accepted: Orders

## 2015-06-05 NOTE — Addendum Note (Signed)
Addended by: Elvina SidleLAUENSTEIN, Gizell Danser on: 06/05/2015 02:01 PM   Modules accepted: Orders

## 2015-06-06 ENCOUNTER — Telehealth: Payer: Self-pay | Admitting: Pulmonary Disease

## 2015-06-06 LAB — FACTOR 5 LEIDEN

## 2015-06-06 NOTE — Telephone Encounter (Signed)
Spoke with pt. States that she has an upper respiratory infection and does not want to the methacholine challenge test. This will be canceled on her behalf. Nothing further was needed.

## 2015-06-07 ENCOUNTER — Encounter (HOSPITAL_COMMUNITY): Payer: 59

## 2015-06-07 NOTE — Telephone Encounter (Signed)
Dr L, do you want to give more RFs of Xarelto, or is pulmonologist going to manage?

## 2015-06-09 LAB — PROTHROMBIN GENE MUTATION

## 2015-06-13 ENCOUNTER — Telehealth: Payer: Self-pay | Admitting: Hematology

## 2015-06-13 NOTE — Telephone Encounter (Signed)
I called pt and left a detailed message about her negative genetic testing for hypercoagulation workup and negative Doppler of her lower extremities. She knows to call me if she has any further questions.  Malachy MoodFeng, [place] Gavitt  06/13/2015

## 2015-06-29 ENCOUNTER — Ambulatory Visit (INDEPENDENT_AMBULATORY_CARE_PROVIDER_SITE_OTHER): Payer: 59

## 2015-06-29 ENCOUNTER — Ambulatory Visit (INDEPENDENT_AMBULATORY_CARE_PROVIDER_SITE_OTHER): Payer: 59 | Admitting: Family Medicine

## 2015-06-29 VITALS — BP 132/82 | HR 104 | Temp 99.6°F | Resp 15 | Ht 68.0 in | Wt 252.8 lb

## 2015-06-29 DIAGNOSIS — R52 Pain, unspecified: Secondary | ICD-10-CM | POA: Diagnosis not present

## 2015-06-29 DIAGNOSIS — R05 Cough: Secondary | ICD-10-CM

## 2015-06-29 DIAGNOSIS — R059 Cough, unspecified: Secondary | ICD-10-CM

## 2015-06-29 LAB — POCT INFLUENZA A/B
Influenza A, POC: NEGATIVE
Influenza B, POC: NEGATIVE

## 2015-06-29 MED ORDER — AZITHROMYCIN 250 MG PO TABS: ORAL_TABLET | ORAL | Status: DC

## 2015-06-29 NOTE — Patient Instructions (Addendum)
  Great to meet you!  I am treating you with an antibiotic, azithromycin, because I am concerned you may have developing pneumonia. You would be at high risk for complication because of your previous PE so I think in this case the benefit outweighs the risk.   Please seek eemergency medical care if your symptoms change or worsen, like I said there is risk of recurrent clot but given the fever and diarrhea its more likely that you have an acute illness in this case.      IF you received an x-ray today, you will receive an invoice from [facility]. Please contact [facility] at [phone] with questions or concerns regarding your invoice.   IF you received labwork today, you will receive an invoice from [facility]. Please contact [facility] at [phone] with questions or concerns regarding your invoice.   Our billing staff will not be able to assist you with questions regarding bills from these companies.  You will be contacted with the lab results as soon as they are available. The fastest way to get your results is to activate your My Chart account. Instructions are located on the last page of this paperwork. If you have not heard from us regarding the results in 2 weeks, please contact this office.

## 2015-06-29 NOTE — Progress Notes (Signed)
   HPI  Patient presents today here with acute illness  Has recent Hx of PE  3 months ago, this the second now on lifelong anticoagulation  She describes 4 days of off and on fever, cough, SOB, body aches and malaise  She began the illness with diarrhea which has resolved.,   She is tolerating fluids and food but has decreased appetite  She is compliant with xarelto, She has chest tightness described as distinctly different than her previous PE pain. No leg swelling.   She states she has had the flu this year, although she cant remember which strain    PMH: Smoking status noted ROS: Per HPI  Objective: BP 132/82 mmHg  Pulse 104  Temp(Src) 99.6 F (37.6 C) (Oral)  Resp 15  Ht 5\' 8"  (1.727 m)  Wt 252 lb 12.8 oz (114.669 kg)  BMI 38.45 kg/m2  SpO2 98%  LMP 06/13/2015 Gen: NAD, alert, cooperative with exam HEENT: NCAT, TMs WNL, Nares with mild turbinate swelling, oropharynx moist and clear CV: RRR, good S1/S2, no murmur Resp: CTABL, no wheezes, non-labored Ext: No edema, warm Neuro: Alert and oriented, No gross deficits  DG chest negative for acute pocesses  Assessment and plan:  # Cough CXR clear but given symps and fever I am concerned for developing CAP Very difficult to guarantee there is no clot, She previously had a negative D dimer and likely had a clot at the same time. She has different character of pain than previous clots and understands the risks of recurrent clot and reasons to seek emetogenic care I offered a CT angio and she wouldl ike to avoid this for now, she has had 3 in the last 3 months Z pack sent Flu negative Discussed usual course of illness and reasons to return.      Orders Placed This Encounter  Procedures  . DG Chest 2 View    Standing Status: Future     Number of Occurrences: 1     Standing Expiration Date: 08/28/2016    Order Specific Question:  Reason for Exam (SYMPTOM  OR DIAGNOSIS REQUIRED)    Answer:  chest pain    Order  Specific Question:  Is the patient pregnant?    Answer:  No    Order Specific Question:  Preferred imaging location?    Answer:  External  . POCT Influenza A/B    Meds ordered this encounter  Medications  . azithromycin (ZITHROMAX) 250 MG tablet    Sig: Take 2 tablets on day 1 and 1 tablet daily after that    Dispense:  6 tablet    Refill:  0    Kevin FentonSamuel Stephannie Broner, MD 9:38 AM

## 2015-07-06 ENCOUNTER — Ambulatory Visit: Payer: 59 | Admitting: Pulmonary Disease

## 2015-07-12 ENCOUNTER — Ambulatory Visit (INDEPENDENT_AMBULATORY_CARE_PROVIDER_SITE_OTHER): Payer: 59 | Admitting: Physician Assistant

## 2015-07-12 VITALS — BP 128/82 | HR 103 | Temp 99.0°F | Resp 20 | Ht 68.0 in | Wt 258.6 lb

## 2015-07-12 DIAGNOSIS — S93401A Sprain of unspecified ligament of right ankle, initial encounter: Secondary | ICD-10-CM | POA: Diagnosis not present

## 2015-07-12 NOTE — Progress Notes (Signed)
[facility] [address], [place] [address] [phone]  Date:  07/12/2015   Name:  Deborah BoardMichelle Soto   DOB:  09-09-1965   MRN:  [id number]  PCP:  Tonye PearsonOLITTLE, ROBERT P, MD    Chief Complaint: Ankle Injury   History of Present Illness:  This is a 50 y.o. female with PMH hx PE, bipolar affective disorder, allergic rhinitis, hepatic steatosis, ankle instability who is presenting with right ankle injury that occurred 1 week ago. States she was walking in a parking lot and slipped in a puddle. She states she somehow landed in the opposite direction. Unsure how her ankle twisted but is having medial ankle swelling and pain. She is having mild tinging in that foot since the injury. She is able to bear weight and walk fine. She just noticed that the pain is not getting any better since happening and was worried something might be wrong. Has tried tylenol a few times with some help. She has been elevating ankle and applying ice. She bought an ankle brace at drug store that she has been wearing.  She has a history of bilateral ankle sprains. She states she went to PT a few years ago and has not had any problems since.   Review of Systems:  Review of Systems See HPI  Patient Active Problem List   Diagnosis Date Noted  . Cervical disc disorder with radiculopathy of cervical region 05/12/2015  . Hepatic steatosis 04/18/2015  . Left sided chest pain   . Dyspnea   . Achilles tendonitis 04/08/2012  . 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 PTE (pulmonary thromboembolism) (HCC) 10/10/2011  . Preeclampsia 10/10/2011  . Diverticulitis 10/10/2011  . Bipolar affective disorder (HCC) 10/10/2011  . BMI 34.0-34.9,adult 10/10/2011  . AR (allergic rhinitis) 10/10/2011    Prior to Admission medications   Medication Sig Start Date End Date Taking? Authorizing Provider  azithromycin (ZITHROMAX) 250 MG tablet Take 2 tablets on day 1 and 1 tablet daily after that 06/29/15  Yes Elenora GammaSamuel L  Bradshaw, MD  Brexpiprazole (REXULTI) 1 MG TABS Take 1 tablet by mouth daily.   Yes Historical Provider, MD  Clindamycin-Benzoyl Per, Refr, gel Apply 1 application topically 2 (two) times daily. Reported on 03/31/2015 03/10/15  Yes Historical Provider, MD  escitalopram (LEXAPRO) 20 MG tablet Take 20 mg by mouth at bedtime.  03/10/15  Yes Historical Provider, MD  gabapentin (NEURONTIN) 300 MG capsule Take 300 mg by mouth 3 (three) times daily.  05/16/15  Yes Historical Provider, MD  XARELTO 20 MG TABS tablet TAKE 1 TABLET DAILY WITH SUPPER. 06/07/15  Yes Elvina SidleKurt Lauenstein, MD  Albuterol Sulfate (PROAIR RESPICLICK) 108 (90 Base) MCG/ACT AEPB Inhale 2 puffs into the lungs every 6 (six) hours as needed. Patient not taking: Reported on 06/29/2015 06/05/15   Elvina SidleKurt Lauenstein, MD    No Known Allergies  Past Surgical History  Procedure Laterality Date  . No past surgeries      Social History  Substance Use Topics  . Smoking status: Former Smoker -- 1.00 packs/day for 20 years    Quit date: 03/12/2004  . Smokeless tobacco: None  . Alcohol Use: 0.6 oz/week    1 Cans of beer, 0 Standard drinks or equivalent per week     Comment: 1-2 times a week    Family History  Problem Relation Age of Onset  . Dementia Father   . Heart disease Father   . Clotting disorder Mother   . Arthritis Mother   .  Clotting disorder Sister   . Clotting disorder Maternal Grandmother   . Clotting disorder Maternal Aunt   . Rheumatologic disease Neg Hx     Medication list has been reviewed and updated.  Physical Examination:  Physical Exam  Constitutional: She is oriented to person, place, and time. She appears well-developed and well-nourished. No distress.  HENT:  Head: Normocephalic and atraumatic.  Right Ear: Hearing normal.  Left Ear: Hearing normal.  Nose: Nose normal.  Eyes: Conjunctivae and lids are normal. Right eye exhibits no discharge. Left eye exhibits no discharge. No scleral icterus.   Cardiovascular: Normal rate, regular rhythm and normal pulses.   Pulmonary/Chest: Effort normal. No respiratory distress.  Musculoskeletal: Normal range of motion.       Right ankle: She exhibits swelling (mild, medial ankle). She exhibits normal range of motion and normal pulse. Tenderness (inferior to medial malleolus). No lateral malleolus, no medial malleolus, no head of 5th metatarsal and no proximal fibula tenderness found. Achilles tendon normal.  Pedal pulses 2+ and symmetric Strength 5/5 bilaterally Sensation intact.  Neurological: She is alert and oriented to person, place, and time. Gait normal.  Skin: Skin is warm, dry and intact. No lesion and no rash noted.  Psychiatric: She has a normal mood and affect. Her speech is normal and behavior is normal. Thought content normal.   BP 128/82 mmHg  Pulse 103  Temp(Src) 99 F (37.2 C) (Oral)  Resp 20  Ht  (1.727 m)  Wt 258 lb 9.6 oz (117.3 kg)  BMI 39.33 kg/m2  SpO2 96%  LMP 06/13/2015  Assessment and Plan:  1. Sprain of ankle, right, initial encounter Medial ankle sprain. No bony tenderness and able to bear weight. Continue with RICE therapy and brace. Return in 1 week if symptoms still not improving.   [place] Miners Dyke Brackett, MHS [facility]  07/12/2015

## 2015-07-12 NOTE — Patient Instructions (Addendum)
Rest, ice, elevate Continue compression with ankle brace Wear supportive shoe like tennis shoe Tylenol as needed for pain If you are not getting better in 1 week, return for further eval/xray at that time    IF you received an x-ray today, you will receive an invoice from [facility]. Please contact [facility] at [phone] with questions or concerns regarding your invoice.   IF you received labwork today, you will receive an invoice from [facility]. Please contact [facility] at [phone] with questions or concerns regarding your invoice.   Our billing staff will not be able to assist you with questions regarding bills from these companies.  You will be contacted with the lab results as soon as they are available. The fastest way to get your results is to activate your My Chart account. Instructions are located on the last page of this paperwork. If you have not heard from us regarding the results in 2 weeks, please contact this office.

## 2015-07-29 ENCOUNTER — Ambulatory Visit (INDEPENDENT_AMBULATORY_CARE_PROVIDER_SITE_OTHER): Payer: 59 | Admitting: Family Medicine

## 2015-07-29 VITALS — BP 128/84 | HR 104 | Temp 98.3°F | Resp 18 | Ht 68.0 in | Wt 261.0 lb

## 2015-07-29 DIAGNOSIS — M5010 Cervical disc disorder with radiculopathy, unspecified cervical region: Secondary | ICD-10-CM

## 2015-07-29 DIAGNOSIS — M791 Myalgia, unspecified site: Secondary | ICD-10-CM

## 2015-07-29 LAB — POCT SEDIMENTATION RATE: POCT SED RATE: 14 mm/hr (ref 0–22)

## 2015-07-29 NOTE — Progress Notes (Signed)
50 yo with constant diffuse pain, poor sleep, shortness of breath, and fatigue who was diagnosed with fibromyalgia by Dr. Dierdre ForthBeekman and sent back here because her lupus tests were negative and he doesn't treat those with fibromyalgia.  Now taking gabapentin 300 bid and has follow up with the injection doctor on Tuesday.  Seeing chiropractor as well.  She never took the prednisone because she developed the flu the day after she was here.  She also is suffering from cervical radiculopathy which did well after the previous injection on March 16, which helped.  The pain is coming back over the last week. (See above)   Objective: BP 128/84 mmHg  Pulse 104  Temp(Src) 98.3 F (36.8 C) (Oral)  Resp 18  Ht 5\' 8"  (1.727 m)  Wt 261 lb (118.389 kg)  BMI 39.69 kg/m2  SpO2 97%  LMP 06/13/2015 Last sed rate was 32 on 03/23/15 Painful internal rotation of right arm with some numbness but preservation of reflexes in BJ and TJ bilaterally  Her neck is nontender She has no rash She does have some tenderness adjacent to the right scapula and below the left scapula  Assessment: Patient has features of fibromyalgia but also with the multisystem complaints, it's hard to ignore the possibility of a rheumatological disease.  Plan: She'll follow-up with the doctor that did her injections. Right check a sedimentation rate today. And a vascular to start prednisone for a couple days to see if the any inflammatory aspect may see difference in her diffuse myalgias.  Signed, Sheila OatsKurt Charlet Harr M.D.

## 2015-07-29 NOTE — Patient Instructions (Addendum)
73mo ago    Factor II, DNA Analysis Comment   Comments: NEGATIVE  No mutation identified.  Comment:  A point mutation (G20210A) in the factor II (prothrombin) gene is the  second most common cause of inherited thrombophilia. The incidence of  this mutation in the [place] Caucasian population is about 2% and in the  [place] population it is approximately 0.5%. This mutation is  rare in the [place] and Native American population. Being heterozygous  for a prothrombin mutation increases the risk for developing venous  thrombosis about 2 to 3 times above the general population risk. Being  homozygous for the prothrombin gene mutation increases the relative  risk for venous thrombosis further, although it is not yet known how  much further the risk is increased. In women heterozygous for the  prothrombin gene mutation, the use of estrogen containing oral  contraceptives increases the relative risk of venous thrombosis about  16 times and the risk of developing cerebral thrombosis is also  significantly increased. In pregnancy the prothrombingene mutation  increases risk for venous thrombosis and may increase risk for  stillbirth, placental abruption, pre-eclampsia and fetal growth  restriction. If the patient possesses two or more congenital or  acquired thrombophilic risk factors, the risk for thrombosis may rise  to more than the sum of the risk ratios for the individual mutations.  This assay detects only the prothrombin G20210A mutation and does  not measure genetic abnormalities elsewhere in the genome. Other  thrombotic risk factors may be pursued through systematic clinical  laboratory analysis. These factors include the R506Q (Leiden)  mutation in the Factor V gene, plasma homocysteine levels, as well  as testing for deficiencies of antithrombin III, protein C and  protein S.     F2 Additional Information: Comment   Comments: Genetic Counselors are available for  health care providers  to discuss results at [phone]-GENE [phone]).  Methodology:  DNA analysis of the Factor II gene was performed by PCR  amplification followed by restriction analysis. The  diagnostic sensitivity is >99% for both. All the tests must  be combined with clinical information for the most accurate  interpretation. Molecular-based testing is highly accurate,  but as in any laboratory test, diagnostic errors may occur.  Poort SR, et al. Blood. 1996; 96:0454-0981.  Varga EA. Circulation. 2004; 110:e15-e18.  Marland Kitchen, et al. Arterioscler Thromb Vasc Biol. [phone].  Vincenza Hews, PhD, [facility]  Ernestene Mention, PhD, [facility]  Gabriela Eves, PhD, [facility]  Wyatt Portela, M.S., PhD, [facility]  Manya Silvas, PhD, [facility]  Bonnielee Haff, PhD, [facility]  Alpha Gula, PhD, [facility]     Resulting Agency [facility]    Narrative     Testing performed at: [TG] [facility], [address], [place], [place], [address], Phone: [phone], Laboratory Director: Oley Balm, MD       Specimen Collected: 05/31/15 12:48 PM Last Resulted: 06/09/15 2:40 PM                      Other Results from 05/31/2015         Antithrombin III*  Status: Finalresult Visible to patient:  MyChart Nextappt: None Dx:  PTE (pulmonary thromboembolism) (HCC)            Ref Range 73mo ago    Antithrombin Activity 75 - 135 % 144 (H)   Comments: An elevated antithrombin activity is of no known clinical  significance. Direct thrombin  inhibitor anticoagulants such as  rivaroxaban, apixaban and edoxaban will lead to spuriously elevated  antithrombin activity levels possibly masking a deficiency.     Resulting Agency [facility]     Narrative     Testing performed at: [BN] [facility], [address], [place], [place], [address], Phone: [phone], Laboratory Director: Mila Homer, MD       Specimen Collected: 05/31/15  12:48 PM Last Resulted: 06/04/15 5:56 PM                    Protein S activity*  Status: Finalresult Visible to patient:  MyChart Nextappt: None Dx:  PTE (pulmonary thromboembolism) (HCC)            Ref Range 420mo ago    Protein S-Functional 63 - 140 % 205 (H)   Comments: An elevated protein S activity is of no known clinical significance.  Protein S activity may be falsely increased (masking an abnormal, low  result) in patients receiving direct Xa inhibitor (e.g., rivaroxaban,  apixaban, edoxaban) or a direct thrombin inhibitor (e.g., dabigatran)  anticoagulant treatment due to assay interference by these drugs.     Resulting Agency [facility]     Narrative     Testing performed at: [BN] [facility], [address] [place], [place], [address], Phone: [phone], Laboratory Director: Mila Homer, MD       Specimen Collected: 05/31/15 12:48 PM Last Resulted: 06/04/15 5:56 PM                    Protein C activity*  Status: Finalresult Visible to patient:  MyChart Nextappt: None Dx:  PTE (pulmonary thromboembolism) (HCC)         Ref Range 420mo ago    Protein C-Functional 73 - 180 % 152   Resulting Agency [facility]     Narrative     Testing performed at: [BN] [facility], [address] [place], [place], [address], Phone: [phone], Laboratory Director: Mila Homer, MD       Specimen Collected: 05/31/15 12:48 PM Last Resulted: 06/04/15 5:56 PM                    Protein C, total*  Status: Finalresult Visible to patient:  MyChart Nextappt: None Dx:  PTE (pulmonary thromboembolism) (HCC)         Ref Range 420mo ago    Protein C Antigen 60 - 150 % 89   Resulting Agency [facility]     Narrative     Testing performed at: [BN] [facility], [address] [place], [place], [address], Phone: [phone], Laboratory Director: Mila Homer, MD       Specimen Collected: 05/31/15 12:48 PM Last Resulted: 06/01/15 12:38 PM                    Protein S, Antigen, Free*  Status: Finalresult Visible to patient:  MyChart Nextappt: None Dx:  PTE (pulmonary thromboembolism) (HCC)         Ref Range 420mo ago    Protein S, Free 57 - 157 % 122   Resulting Agency [facility]     Narrative     Testing performed at: [BN] [facility], [address], [place], [place], [address], Phone: [phone], Laboratory Director: Mila Homer, MD       Specimen Collected: 05/31/15 12:48 PM Last Resulted: 06/04/15 5:56 PM  Factor 5 Leiden*  Status: Finalresult Visible to patient:  MyChart Nextappt: None Dx:  PTE (pulmonary thromboembolism) (HCC)         7mo ago    Factor V Leiden Comment   Comments: Result: Negative (no mutation found)  Factor V Leiden is a specific mutation (R506Q) in the factor  V gene that is associated with an increased risk of venous  thrombosis. Factor V Leiden is more resistant to  inactivation by activated protein C. As a result, factor V  persists in the circulation leading to a mild hyper-  coagulable state. The Leiden mutation accounts for 90% -  95% of APC resistance. Factor V Leiden has been reported in  patients with deep vein thrombosis, pulmonary embolus,  central retinal vein occlusion, cerebral sinus thrombosis  and hepatic vein thrombosis. Other risk factors to be  considered in the workup for venous thrombosis include the  G20210A mutation in the factor II (prothrombin) gene,  protein S and C deficiency, and antithrombin deficiencies.  Anticardiolipin antibody and lupus anticoagulant analysis  may be appropriate for certain patients, as well as  homocysteine levels.  Contact your local [facility] for information on how to order  additional testing if desired.     Comment: Comment   Comments:  [facility]**Genetic counselors are available for health care providers to**   discuss results at [phone] [phone]).  Methodology:  DNA analysis of the Factor V gene was performed by allele-specific  PCR. The diagnostic sensitivity and specificity is >99% for both.  Molecular-based testing is highly accurate, but as in any laboratory  test, diagnostic errors may occur. All test results must be combined  with clinical information for the most accurate interpretation.  References:  Voelkerding K (1996). Clin Lab Med [phone]:169-186.  Vincenza Hewshevonne Eversley, PhD, [facility]  Ernestene MentionMelissa A Hayden, PhD, [facility]  Gabriela EvesSuzette M Huguenin, PhD, [facility]  Wyatt PortelaAnnette K Taylor, M.S., PhD, [facility]  Manya SilvasAlecia Willis, PhD, [facility]  Bonnielee HaffHongli Zhan, PhD, [facility]  Alpha GulaJoseph B Kearney PhD, [facility]     [facility]    Narrative     Testing performed at: [TG] [facility], [address], [place], [place], [address], Phone: [phone], Laboratory Director: Oley BalmArundhati Chatterjee, MD       Specimen Collected: 05/31/15 12:48 PM Last Resulted: 06/06/15 2:39 PM                         Reviewed by List     Malachy MoodYan Feng, MD on 06/13/2015 5:28 PM     Encounter     View Encounter      Result Information     Status    Final result (06/09/2015 2:40 PM)    Provider Status: Reviewed        Lab Information     [facility]    [address]    [place] [id number]      Order-Level Documents:     There are no order-level documents.     View SmartLink Info     PROTHROMBIN GENE MUTATION (Order #[id number]) on 05/31/15    Prothrombin gene mutation* (Order [id number])  Lab  Order: [id number]   Released By: Delcie RochHarold D Shumate  Authorizing: Malachy MoodYan Feng, MD   Date: 05/31/2015  [facility]       Order Information     Order Date/Time Release Date/Time Start Date/Time End Date/Time    05/31/15 12:42 PM 05/31/15 12:47 PM 05/31/15  12:47 PM  None      Order Details     Frequency Duration Priority Order Class    None None Routine Lab Collect      Acc#    [id number]      Associated Diagnoses       ICD-9-CM ICD-10-CM    PTE (pulmonary thromboembolism) (HCC)    415.19 I26.99      Order History  Outpatient    Date/Time Action Taken User Additional Information    05/31/15 1247 Release Delcie Roch [id number]    06/09/15 1440 Result Lab in Three [facility] Interface Final      Order Requisition     PROTHROMBIN GENE MUTATION (Order #[id number]) on 05/31/15     Collection Information     Collected: 05/31/2015 12:48 PM   Resulting Agency: [facility]       View SmartLink Info     PROTHROMBIN GENE MUTATION (Order #[id number]) on 05/31/15

## 2015-07-30 ENCOUNTER — Telehealth: Payer: Self-pay | Admitting: Emergency Medicine

## 2015-07-30 NOTE — Telephone Encounter (Signed)
Pt called in requesting medication refill pain medication Oxycodone States, original script written by Dr. Merla Richesoolittle Informed pt, I will send request to Dr. Merla Richesoolittle

## 2015-08-02 ENCOUNTER — Ambulatory Visit (INDEPENDENT_AMBULATORY_CARE_PROVIDER_SITE_OTHER): Payer: 59 | Admitting: Family Medicine

## 2015-08-02 VITALS — BP 134/92 | HR 98 | Temp 98.4°F | Resp 18 | Ht 68.0 in | Wt 258.0 lb

## 2015-08-02 DIAGNOSIS — M791 Myalgia, unspecified site: Secondary | ICD-10-CM

## 2015-08-02 DIAGNOSIS — R52 Pain, unspecified: Secondary | ICD-10-CM | POA: Diagnosis not present

## 2015-08-02 MED ORDER — OXYCODONE-ACETAMINOPHEN 5-325 MG PO TABS: 1.0000 | ORAL_TABLET | Freq: Two times a day (BID) | ORAL | Status: DC | PRN

## 2015-08-02 NOTE — Progress Notes (Signed)
This is a 50 year old woman who has had ongoing problems with shortness of breath and myalgia. She has seen Dr. Dierdre ForthBeekman who could not diagnose a rheumatological disease after he did multiple blood studies.  Still the patient is getting discouraged. She continues to have the myalgia which is difficult to manage. She goes to bed at 9:00 with fatigue.  Patient cannot exercise very much because of the pain.  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 I think she is getting depressed. She looking forward to getting some injections and I'm looking for her forms to approve this.  Plan: Upcoming injections, we get the beach, and follow-up with Dr. Merla Richesoolittle when he gets back to town. I refilled her oxycodone for twice a day in the meantime.  Signed, Sheila OatsKurt Nick Stults M.D.

## 2015-08-02 NOTE — Patient Instructions (Addendum)
  Follow up with Dr. Merla Richesoolittle in several weeks.

## 2015-08-02 NOTE — Telephone Encounter (Signed)
Advised pt that she should come in to be seen.  Pt will be in today.

## 2015-08-15 ENCOUNTER — Telehealth: Payer: Self-pay

## 2015-08-15 NOTE — Telephone Encounter (Signed)
Why does she need to go off it - for surgery?

## 2015-08-15 NOTE — Telephone Encounter (Signed)
Yes

## 2015-08-15 NOTE — Telephone Encounter (Signed)
Spoke with United StationersDanielle. Pt needs an order to discontinue Xarelto in writing. Then fax to Delbert HarnessMurphy Wainer at the number below.  Duwayne HeckDanielle [phone] Fax

## 2015-08-15 NOTE — Telephone Encounter (Signed)
What surgery? There is no documentation in her chart about a surgery occurring

## 2015-08-16 ENCOUNTER — Ambulatory Visit (INDEPENDENT_AMBULATORY_CARE_PROVIDER_SITE_OTHER): Payer: 59

## 2015-08-16 ENCOUNTER — Ambulatory Visit (HOSPITAL_COMMUNITY)
Admission: RE | Admit: 2015-08-16 | Discharge: 2015-08-16 | Disposition: A | Discharge status: Home / Self Care | Payer: 59 | Source: Ambulatory Visit | Attending: Family Medicine | Admitting: Family Medicine

## 2015-08-16 ENCOUNTER — Ambulatory Visit (INDEPENDENT_AMBULATORY_CARE_PROVIDER_SITE_OTHER): Payer: 59 | Admitting: Family Medicine

## 2015-08-16 ENCOUNTER — Telehealth: Payer: Self-pay | Admitting: Emergency Medicine

## 2015-08-16 VITALS — BP 130/86 | HR 95 | Temp 97.6°F | Resp 17 | Ht 68.0 in | Wt 260.0 lb

## 2015-08-16 DIAGNOSIS — R0781 Pleurodynia: Secondary | ICD-10-CM | POA: Diagnosis not present

## 2015-08-16 DIAGNOSIS — R079 Chest pain, unspecified: Secondary | ICD-10-CM | POA: Diagnosis present

## 2015-08-16 DIAGNOSIS — K760 Fatty (change of) liver, not elsewhere classified: Secondary | ICD-10-CM | POA: Diagnosis not present

## 2015-08-16 DIAGNOSIS — Z86711 Personal history of pulmonary embolism: Secondary | ICD-10-CM

## 2015-08-16 MED ORDER — IOPAMIDOL (ISOVUE-370) INJECTION 76%
100.0000 mL | Freq: Once | INTRAVENOUS | Status: AC | PRN
  Administered 2015-08-16: 100 mL via INTRAVENOUS

## 2015-08-16 NOTE — Progress Notes (Addendum)
By signing my name below I, Shelah Lewandowsky, attest that this documentation has been prepared under the direction and in the presence of Shade Flood, MD. Electonically Signed. Shelah Lewandowsky, Scribe 08/16/2015 at 12:58 PM  Subjective:    Patient ID: Deborah Soto, female    DOB: 1966/01/10, 50 y.o.   MRN: [id number]  Chief Complaint  Patient presents with  . Chest Pain    with deep breath hx of PE     HPI Deborah Soto is a 50 y.o. female who presents to the [facility] complaining of pleuritic CP that started last night. Pain is stabbing and similar to pt's prior PE. Today's CP is in left anterior chest and pt's PE in the past caused pain in left posterior chest. Pain is only with inspiration and worse when laying down. Pt lost sleep last night due to pain. Pain is improved with shallow inspiration.    Chronic SOB since January 2017 when pt had PE. No change in SOB since last night. Pt has been taking her xarelto faithfully. Pt denies any recent long car rides or plane trips. Pt denies calf tenderness. No blood clots detected since blood thinner was started.   Denies any recent fall or injury to chest. Pt denies any cough, chest congestion, runny nose, or fever.   Pt seen for acute CP Jan 2017. Submassive PE treated with heparin initially. Normal echo, negative doppler for DVT, started on xarelto.  Pt is on chronic anticoagulation. Pt has multiple family members with PE. Unknown cause. Pt had negative hypercoagulopathy testing with Dr Mosetta Putt in March 2017. Pt reports that she had a negative D-dimer a week prior to PE.    Most recent CTA of chest June 05, 2015 showed no PE and resolved left lower lobe opacity.    Pt denies history of heart burn, CAD. Pt had a low risk stress test in Feb 2017.     Patient Active Problem List   Diagnosis Date Noted  . Cervical disc disorder with radiculopathy of cervical region 05/12/2015  . Hepatic steatosis 04/18/2015  . Left  sided chest pain   . Dyspnea   . Achilles tendonitis 04/08/2012  . 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 PTE (pulmonary thromboembolism) (HCC) 10/10/2011  . Preeclampsia 10/10/2011  . Diverticulitis 10/10/2011  . Bipolar affective disorder (HCC) 10/10/2011  . BMI 34.0-34.9,adult 10/10/2011  . AR (allergic rhinitis) 10/10/2011   Past Medical History  Diagnosis Date  . Depression   . Embolism - blood clot January 1997 & January 2017    Also had LLE DVT in 1997  . Asthma    Past Surgical History  Procedure Laterality Date  . No past surgeries     No Known Allergies Prior to Admission medications   Medication Sig Start Date End Date Taking? Authorizing Provider  Brexpiprazole (REXULTI) 1 MG TABS Take 1 tablet by mouth daily.   Yes Historical Provider, MD  Clindamycin-Benzoyl Per, Refr, gel Apply 1 application topically 2 (two) times daily. Reported on 03/31/2015 03/10/15  Yes Historical Provider, MD  escitalopram (LEXAPRO) 20 MG tablet Take 20 mg by mouth at bedtime.  03/10/15  Yes Historical Provider, MD  gabapentin (NEURONTIN) 300 MG capsule Take 300 mg by mouth 3 (three) times daily.  05/16/15  Yes Historical Provider, MD  XARELTO 20 MG TABS tablet TAKE 1 TABLET DAILY WITH SUPPER. 06/07/15  Yes Elvina Sidle, MD  oxyCODONE-acetaminophen (ROXICET) 5-325 MG tablet Take 1 tablet by mouth 2 (two)  times daily as needed for severe pain. Patient not taking: Reported on 08/16/2015 08/02/15   Elvina Sidle, MD   Social History   Social History  . Marital Status: Married    Spouse Name: N/A  . Number of Children: Y  . Years of Education: N/A   Occupational History  . admin assist    Social History Main Topics  . Smoking status: Former Smoker -- 1.00 packs/day for 20 years    Quit date: 03/12/2004  . Smokeless tobacco: Not on file  . Alcohol Use: 0.6 oz/week    1 Cans of beer, 0 Standard drinks or equivalent per week     Comment: 1-2 times a week  . Drug Use: No  . Sexual Activity: Not  on file   Other Topics Concern  . Not on file   Social History Narrative   Originally from [place]. Always lived in [place]. 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 No international travel recently. Previously has been to [place] in May 1996. No mold exposure recently. No bird exposure. Does have multiple pets.       Review of Systems  Constitutional: Negative for fever, fatigue and unexpected weight change.  HENT: Negative for congestion and rhinorrhea.   Respiratory: Positive for shortness of breath (chronic, no change). Negative for cough and chest tightness.   Cardiovascular: Positive for chest pain. Negative for palpitations and leg swelling.  Gastrointestinal: Negative for abdominal pain and blood in stool.  Neurological: Negative for dizziness, syncope, light-headedness and headaches.  Psychiatric/Behavioral: Positive for sleep disturbance.       Objective:   Physical Exam  Constitutional: She is oriented to person, place, and time. She appears well-developed and well-nourished.  HENT:  Head: Normocephalic and atraumatic.  Eyes: Conjunctivae and EOM are normal. Pupils are equal, round, and reactive to light.  Neck: Carotid bruit is not present.  Cardiovascular: Normal rate, regular rhythm, normal heart sounds and intact distal pulses.  Exam reveals no gallop and no friction rub.   No murmur heard. Pulmonary/Chest: Effort normal and breath sounds normal. No accessory muscle usage. No respiratory distress. She has no decreased breath sounds. She has no wheezes. She has no rhonchi. She has no rales. She exhibits tenderness.  Can reproduce CP with palpation when pt is in the supine position only. Chest wall is non tender when pt is sitting or standing.  Abdominal: Soft. She exhibits no pulsatile midline mass. There is no tenderness.  Neurological: She is alert and oriented to person, place, and time.  Skin: Skin is warm and dry.  Psychiatric: She has a normal mood and affect. Her  behavior is normal.  Vitals reviewed.    Filed Vitals:   08/16/15 1026  BP: 130/86  Pulse: 95  Temp: 97.6 F (36.4 C)  TempSrc: Oral  Resp: 17  Height: 5\' 8"  (1.727 m)  Weight: 260 lb (117.935 kg)  SpO2: 97%    Chest Xray viewed by Dr Neva Seat.  Dg Chest 2 View  08/16/2015  CLINICAL DATA:  Pleuritic left-sided chest pain since last night, history of pulmonary embolism, on anticoagulants therapy EXAM: CHEST  2 VIEW COMPARISON:  Chest x-ray of 06/29/2015 FINDINGS: No active infiltrate or effusion is seen. Mild peribronchial thickening is noted which may indicate bronchitis. Mediastinal and hilar contours are unremarkable. The heart is within normal limits in size. No bony abnormality is seen. IMPRESSION: No active lung disease. Peribronchial thickening may indicate bronchitis. Electronically Signed   By: Lucienne Minks.D.  On: 08/16/2015 12:54    EKG interpreted by Dr Neva Seat. EKG shows sinus rhythm. Flat/nonspecific T-waves inferiorly and laterally. No significant changes from prior EKG in February 2017.       Assessment & Plan:   Deborah Soto is a 50 y.o. female Left sided chest pain - Plan: EKG 12-Lead, DG Chest 2 View, CT Angio Chest W/Cm &/Or Wo Cm, CANCELED: CT Angio Chest W/Cm &/Or Wo Cm  Pleuritic pain - Plan: EKG 12-Lead, DG Chest 2 View, CT Angio Chest W/Cm &/Or Wo Cm, CANCELED: CT Angio Chest W/Cm &/Or Wo Cm  History of pulmonary embolus (PE) - Plan: CT Angio Chest W/Cm &/Or Wo Cm, CANCELED: CT Angio Chest W/Cm &/Or Wo Cm  History of PE, similar symptoms with onset last night. Currently on Xarelto.  -Reassuring chest x-ray, reassuring EKG, but with multiple PEs in past and family history, will check CT angiogram today. If this is negative, other information on chest pain/chest wall pain was discussed and handout given. RTC/ER precautions if worsens.  CT result noted: IMPRESSION: No evidence of pulmonary embolus or other vascular abnormality.  No evidence of acute  pulmonary disease.  Hepatic steatosis.  Message left on voicemail that study was normal. Continue plan as on after visit summary, and if any questions regarding results or plan, call back for clarification.    No orders of the defined types were placed in this encounter.   Patient Instructions       IF you received an x-ray today, you will receive an invoice from [facility]. Please contact [facility] at [phone] with questions or concerns regarding your invoice.   IF you received labwork today, you will receive an invoice from [employer]. Please contact [facility] at [phone] with questions or concerns regarding your invoice.   Our billing staff will not be able to assist you with questions regarding bills from these companies.  You will be contacted with the lab results as soon as they are available. The fastest way to get your results is to activate your My Chart account. Instructions are located on the last page of this paperwork. If you have not heard from [name] regarding the results in 2 weeks, please contact this office.     We recommend that you schedule a mammogram for breast cancer screening. Typically, you do not need a referral to do this. Please contact a local imaging center to schedule your mammogram.  [facility] - [phone]  *ask for the Radiology Department [facility]) - [phone] or [phone]  [facility] - [phone] [facility] - [phone] [facility]  - [phone]  *ask for the Radiology Department [facility] - [phone]  *ask for the Radiology Department [facility] - [phone]  *ask for the Mammography Department [facility] - [phone]  We will schedule the CT of the chest today, then let you know once I have the results.   If this is negative for blood clot, I  listed other causes of chest pain or chest wall pain below.  If worsening symptoms as the week progresses, return here or the emergency room.  Nonspecific Chest Pain  Chest pain can be caused by many different conditions. There is always a chance that your pain could be related to something serious, such as a heart attack or a blood clot in your lungs. Chest pain can also be caused by conditions that  are not life-threatening. If you have chest pain, it is very important to follow up with your health care provider. CAUSES  Chest pain can be caused by:  Heartburn.  Pneumonia or bronchitis.  Anxiety or stress.  Inflammation around your heart (pericarditis) or lung (pleuritis or pleurisy).  A blood clot in your lung.  A collapsed lung (pneumothorax). It can develop suddenly on its own (spontaneous pneumothorax) or from trauma to the chest.  Shingles infection (varicella-zoster virus).  Heart attack.  Damage to the bones, muscles, and cartilage that make up your chest wall. This can include:  Bruised bones due to injury.  Strained muscles or cartilage due to frequent or repeated coughing or overwork.  Fracture to one or more ribs.  Sore cartilage due to inflammation (costochondritis). RISK FACTORS  Risk factors for chest pain may include:  Activities that increase your risk for trauma or injury to your chest.  Respiratory infections or conditions that cause frequent coughing.  Medical conditions or overeating that can cause heartburn.  Heart disease or family history of heart disease.  Conditions or health behaviors that increase your risk of developing a blood clot.  Having had chicken pox (varicella zoster). SIGNS AND SYMPTOMS Chest pain can feel like:  Burning or tingling on the surface of your chest or deep in your chest.  Crushing, pressure, aching, or squeezing pain.  Dull or sharp pain that is worse when you move, cough, or take a deep breath.  Pain that is  also felt in your back, neck, shoulder, or arm, or pain that spreads to any of these areas. Your chest pain may come and go, or it may stay constant. DIAGNOSIS Lab tests or other studies may be needed to find the cause of your pain. Your health care provider may have you take a test called an ambulatory ECG (electrocardiogram). An ECG records your heartbeat patterns at the time the test is performed. You may also have other tests, such as:  Transthoracic echocardiogram (TTE). During echocardiography, sound waves are used to create a picture of all of the heart structures and to look at how blood flows through your heart.  Transesophageal echocardiogram (TEE).This is a more advanced imaging test that obtains images from inside your body. It allows your health care provider to see your heart in finer detail.  Cardiac monitoring. This allows your health care provider to monitor your heart rate and rhythm in real time.  Holter monitor. This is a portable device that records your heartbeat and can help to diagnose abnormal heartbeats. It allows your health care provider to track your heart activity for several days, if needed.  Stress tests. These can be done through exercise or by taking medicine that makes your heart beat more quickly.  Blood tests.  Imaging tests. TREATMENT  Your treatment depends on what is causing your chest pain. Treatment may include:  Medicines. These may include:  Acid blockers for heartburn.  Anti-inflammatory medicine.  Pain medicine for inflammatory conditions.  Antibiotic medicine, if an infection is present.  Medicines to dissolve blood clots.  Medicines to treat coronary artery disease.  Supportive care for conditions that do not require medicines. This may include:  Resting.  Applying heat or cold packs to injured areas.  Limiting activities until pain decreases. HOME CARE INSTRUCTIONS  If you were prescribed an antibiotic medicine, finish it  all even if you start to feel better.  Avoid any activities that bring on chest pain.  Do not use any  tobacco products, including cigarettes, chewing tobacco, or electronic cigarettes. If you need help quitting, ask your health care provider.  Do not drink alcohol.  Take medicines only as directed by your health care provider.  Keep all follow-up visits as directed by your health care provider. This is important. This includes any further testing if your chest pain does not go away.  If heartburn is the cause for your chest pain, you may be told to keep your head raised (elevated) while sleeping. This reduces the chance that acid will go from your stomach into your esophagus.  Make lifestyle changes as directed by your health care provider. These may include:  Getting regular exercise. Ask your health care provider to suggest some activities that are safe for you.  Eating a heart-healthy diet. A registered dietitian can help you to learn healthy eating options.  Maintaining a healthy weight.  Managing diabetes, if necessary.  Reducing stress. SEEK MEDICAL CARE IF:  Your chest pain does not go away after treatment.  You have a rash with blisters on your chest.  You have a fever. SEEK IMMEDIATE MEDICAL CARE IF:   Your chest pain is worse.  You have an increasing cough, or you cough up blood.  You have severe abdominal pain.  You have severe weakness.  You faint.  You have chills.  You have sudden, unexplained chest discomfort.  You have sudden, unexplained discomfort in your arms, back, neck, or jaw.  You have shortness of breath at any time.  You suddenly start to sweat, or your skin gets clammy.  You feel nauseous or you vomit.  You suddenly feel light-headed or dizzy.  Your heart begins to beat quickly, or it feels like it is skipping beats. These symptoms may represent a serious problem that is an emergency. Do not wait to see if the symptoms will go away.  Get medical help right away. Call your local emergency services (911 in the [place] Do not drive yourself to the hospital.   This information is not intended to replace advice given to you by your health care provider. Make sure you discuss any questions you have with your health care provider.   Document Released: 12/06/2004 Document Revised: 03/19/2014 Document Reviewed: 10/02/2013 Elsevier Interactive Patient Education 2016 [employer]. Chest Wall Pain Chest wall pain is pain in or around the bones and muscles of your chest. Sometimes, an injury causes this pain. Sometimes, the cause may not be known. This pain may take several weeks or longer to get better. HOME CARE INSTRUCTIONS  Pay attention to any changes in your symptoms. Take these actions to help with your pain:   Rest as told by your health care provider.   Avoid activities that cause pain. These include any activities that use your chest muscles or your abdominal and side muscles to lift heavy items.   If directed, apply ice to the painful area:  Put ice in a plastic bag.  Place a towel between your skin and the bag.  Leave the ice on for 20 minutes, 2-3 times per day.  Take over-the-counter and prescription medicines only as told by your health care provider.  Do not use tobacco products, including cigarettes, chewing tobacco, and e-cigarettes. If you need help quitting, ask your health care provider.  Keep all follow-up visits as told by your health care provider. This is important. SEEK MEDICAL CARE IF:  You have a fever.  Your chest pain becomes worse.  You have new symptoms.  SEEK IMMEDIATE MEDICAL CARE IF:  You have nausea or vomiting.  You feel sweaty or light-headed.  You have a cough with phlegm (sputum) or you cough up blood.  You develop shortness of breath.   This information is not intended to replace advice given to you by your health care provider. Make sure you discuss any questions you  have with your health care provider.   Document Released: 02/26/2005 Document Revised: 11/17/2014 Document Reviewed: 05/24/2014 Elsevier Interactive Patient Education [employer]     I personally performed the services described in this documentation, which was scribed in my presence. The recorded information has been reviewed and considered, and addended by me as needed.   Signed,   [name], MD [facility].  08/16/2015 1:27 PM

## 2015-08-16 NOTE — Patient Instructions (Addendum)
IF you received an x-ray today, you will receive an invoice from [facility]. Please contact [facility] at [phone] with questions or concerns regarding your invoice.   IF you received labwork today, you will receive an invoice from [employer]. Please contact [facility] at [phone] with questions or concerns regarding your invoice.   Our billing staff will not be able to assist you with questions regarding bills from these companies.  You will be contacted with the lab results as soon as they are available. The fastest way to get your results is to activate your My Chart account. Instructions are located on the last page of this paperwork. If you have not heard from [name] regarding the results in 2 weeks, please contact this office.     We recommend that you schedule a mammogram for breast cancer screening. Typically, you do not need a referral to do this. Please contact a local imaging center to schedule your mammogram.  [facility] - [phone]  *ask for the Radiology Department [facility]) - [phone] or [phone]  [facility] - [phone] [facility] - [phone] [facility] - [phone]  *ask for the Radiology Department [facility] - [phone]  *ask for the Radiology Department [facility] - [phone]  *ask for the Mammography Department [facility] - [phone]  We will schedule the CT of the chest today, then let you know once I have the results.   If this is negative for blood clot, I listed other causes of chest pain or chest wall pain below.  If worsening symptoms as the week progresses, return here or the emergency room.  Nonspecific Chest Pain  Chest pain can be caused by many different conditions. There is always a chance that your pain could be related to something serious, such as a  heart attack or a blood clot in your lungs. Chest pain can also be caused by conditions that are not life-threatening. If you have chest pain, it is very important to follow up with your health care provider. CAUSES  Chest pain can be caused by:  Heartburn.  Pneumonia or bronchitis.  Anxiety or stress.  Inflammation around your heart (pericarditis) or lung (pleuritis or pleurisy).  A blood clot in your lung.  A collapsed lung (pneumothorax). It can develop suddenly on its own (spontaneous pneumothorax) or from trauma to the chest.  Shingles infection (varicella-zoster virus).  Heart attack.  Damage to the bones, muscles, and cartilage that make up your chest wall. This can include:  Bruised bones due to injury.  Strained muscles or cartilage due to frequent or repeated coughing or overwork.  Fracture to one or more ribs.  Sore cartilage due to inflammation (costochondritis). RISK FACTORS  Risk factors for chest pain may include:  Activities that increase your risk for trauma or injury to your chest.  Respiratory infections or conditions that cause frequent coughing.  Medical conditions or overeating that can cause heartburn.  Heart disease or family history of heart disease.  Conditions or health behaviors that increase your risk of developing a blood clot.  Having had chicken pox (varicella zoster). SIGNS AND SYMPTOMS Chest pain can feel like:  Burning or tingling on the surface of your chest or deep in your chest.  Crushing, pressure, aching, or squeezing pain.  Dull or sharp pain that is worse when you move, cough, or take a  deep breath.  Pain that is also felt in your back, neck, shoulder, or arm, or pain that spreads to any of these areas. Your chest pain may come and go, or it may stay constant. DIAGNOSIS Lab tests or other studies may be needed to find the cause of your pain. Your health care provider may have you take a test called an ambulatory ECG  (electrocardiogram). An ECG records your heartbeat patterns at the time the test is performed. You may also have other tests, such as:  Transthoracic echocardiogram (TTE). During echocardiography, sound waves are used to create a picture of all of the heart structures and to look at how blood flows through your heart.  Transesophageal echocardiogram (TEE).This is a more advanced imaging test that obtains images from inside your body. It allows your health care provider to see your heart in finer detail.  Cardiac monitoring. This allows your health care provider to monitor your heart rate and rhythm in real time.  Holter monitor. This is a portable device that records your heartbeat and can help to diagnose abnormal heartbeats. It allows your health care provider to track your heart activity for several days, if needed.  Stress tests. These can be done through exercise or by taking medicine that makes your heart beat more quickly.  Blood tests.  Imaging tests. TREATMENT  Your treatment depends on what is causing your chest pain. Treatment may include:  Medicines. These may include:  Acid blockers for heartburn.  Anti-inflammatory medicine.  Pain medicine for inflammatory conditions.  Antibiotic medicine, if an infection is present.  Medicines to dissolve blood clots.  Medicines to treat coronary artery disease.  Supportive care for conditions that do not require medicines. This may include:  Resting.  Applying heat or cold packs to injured areas.  Limiting activities until pain decreases. HOME CARE INSTRUCTIONS  If you were prescribed an antibiotic medicine, finish it all even if you start to feel better.  Avoid any activities that bring on chest pain.  Do not use any tobacco products, including cigarettes, chewing tobacco, or electronic cigarettes. If you need help quitting, ask your health care provider.  Do not drink alcohol.  Take medicines only as directed by  your health care provider.  Keep all follow-up visits as directed by your health care provider. This is important. This includes any further testing if your chest pain does not go away.  If heartburn is the cause for your chest pain, you may be told to keep your head raised (elevated) while sleeping. This reduces the chance that acid will go from your stomach into your esophagus.  Make lifestyle changes as directed by your health care provider. These may include:  Getting regular exercise. Ask your health care provider to suggest some activities that are safe for you.  Eating a heart-healthy diet. A registered dietitian can help you to learn healthy eating options.  Maintaining a healthy weight.  Managing diabetes, if necessary.  Reducing stress. SEEK MEDICAL CARE IF:  Your chest pain does not go away after treatment.  You have a rash with blisters on your chest.  You have a fever. SEEK IMMEDIATE MEDICAL CARE IF:   Your chest pain is worse.  You have an increasing cough, or you cough up blood.  You have severe abdominal pain.  You have severe weakness.  You faint.  You have chills.  You have sudden, unexplained chest discomfort.  You have sudden, unexplained discomfort in your arms, back, neck, or jaw.  You have shortness of breath at any time.  You suddenly start to sweat, or your skin gets clammy.  You feel nauseous or you vomit.  You suddenly feel light-headed or dizzy.  Your heart begins to beat quickly, or it feels like it is skipping beats. These symptoms may represent a serious problem that is an emergency. Do not wait to see if the symptoms will go away. Get medical help right away. Call your local emergency services (911 in the [place] Do not drive yourself to the hospital.   This information is not intended to replace advice given to you by your health care provider. Make sure you discuss any questions you have with your health care provider.   Document  Released: 12/06/2004 Document Revised: 03/19/2014 Document Reviewed: 10/02/2013 Elsevier Interactive Patient Education 2016 Elsevier Inc. Chest Wall Pain Chest wall pain is pain in or around the bones and muscles of your chest. Sometimes, an injury causes this pain. Sometimes, the cause may not be known. This pain may take several weeks or longer to get better. HOME CARE INSTRUCTIONS  Pay attention to any changes in your symptoms. Take these actions to help with your pain:   Rest as told by your health care provider.   Avoid activities that cause pain. These include any activities that use your chest muscles or your abdominal and side muscles to lift heavy items.   If directed, apply ice to the painful area:  Put ice in a plastic bag.  Place a towel between your skin and the bag.  Leave the ice on for 20 minutes, 2-3 times per day.  Take over-the-counter and prescription medicines only as told by your health care provider.  Do not use tobacco products, including cigarettes, chewing tobacco, and e-cigarettes. If you need help quitting, ask your health care provider.  Keep all follow-up visits as told by your health care provider. This is important. SEEK MEDICAL CARE IF:  You have a fever.  Your chest pain becomes worse.  You have new symptoms. SEEK IMMEDIATE MEDICAL CARE IF:  You have nausea or vomiting.  You feel sweaty or light-headed.  You have a cough with phlegm (sputum) or you cough up blood.  You develop shortness of breath.   This information is not intended to replace advice given to you by your health care provider. Make sure you discuss any questions you have with your health care provider.   Document Released: 02/26/2005 Document Revised: 11/17/2014 Document Reviewed: 05/24/2014 Elsevier Interactive Patient Education [employer]

## 2015-08-16 NOTE — Telephone Encounter (Signed)
Steroid epidural cervical injection C7

## 2015-08-16 NOTE — Telephone Encounter (Signed)
Pt scheduled for CT angiogram @ [facility] now Instructed to go to 1st floor for further directions. [facility] made aware [facility]

## 2015-08-16 NOTE — Telephone Encounter (Signed)
Pt needs to contact Dr Mosetta PuttFeng regarding this issue.

## 2015-08-17 NOTE — Telephone Encounter (Signed)
LM with Danielle advising response.

## 2015-09-20 DIAGNOSIS — R0683 Snoring: Secondary | ICD-10-CM | POA: Insufficient documentation

## 2015-12-07 ENCOUNTER — Ambulatory Visit (INDEPENDENT_AMBULATORY_CARE_PROVIDER_SITE_OTHER): Payer: 59 | Admitting: Physician Assistant

## 2015-12-07 ENCOUNTER — Encounter: Payer: Self-pay | Admitting: Physician Assistant

## 2015-12-07 VITALS — BP 120/84 | HR 75 | Temp 98.5°F | Resp 17 | Ht 68.0 in | Wt 240.0 lb

## 2015-12-07 DIAGNOSIS — R109 Unspecified abdominal pain: Secondary | ICD-10-CM | POA: Diagnosis not present

## 2015-12-07 DIAGNOSIS — R3989 Other symptoms and signs involving the genitourinary system: Secondary | ICD-10-CM

## 2015-12-07 LAB — POCT CBC
Granulocyte percent: 59.6 %G (ref 37–80)
HCT, POC: 41.9 % (ref 37.7–47.9)
Hemoglobin: 14.3 g/dL (ref 12.2–16.2)
Lymph, poc: 3.1 (ref 0.6–3.4)
MCH, POC: 29.6 pg (ref 27–31.2)
MCHC: 34.2 g/dL (ref 31.8–35.4)
MCV: 86.7 fL (ref 80–97)
MID (cbc): 0.4 (ref 0–0.9)
MPV: 8.7 fL (ref 0–99.8)
POC Granulocyte: 5.1 (ref 2–6.9)
POC LYMPH PERCENT: 36.3 %L (ref 10–50)
POC MID %: 4.1 %M (ref 0–12)
Platelet Count, POC: 292 10*3/uL (ref 142–424)
RBC: 4.83 M/uL (ref 4.04–5.48)
RDW, POC: 13.7 %
WBC: 8.6 10*3/uL (ref 4.6–10.2)

## 2015-12-07 LAB — BASIC METABOLIC PANEL WITH GFR
BUN: 15 mg/dL (ref 7–25)
CO2: 26 mmol/L (ref 20–31)
Calcium: 9.6 mg/dL (ref 8.6–10.2)
Chloride: 102 mmol/L (ref 98–110)
Creat: 0.68 mg/dL (ref 0.50–1.10)
GFR, Est African American: >89 mL/min (ref >=60)
GFR, Est Non African American: >89 mL/min (ref >=60)
Glucose, Bld: 131 mg/dL — ABNORMAL HIGH (ref 65–99)
Potassium: 4.4 mmol/L (ref 3.5–5.3)
Sodium: 138 mmol/L (ref 135–146)

## 2015-12-07 LAB — POCT URINALYSIS DIP (MANUAL ENTRY)
Bilirubin, UA: NEGATIVE
Glucose, UA: 250 — AB
Ketones, POC UA: NEGATIVE
Leukocytes, UA: NEGATIVE
Nitrite, UA: NEGATIVE
Protein Ur, POC: 30 — AB
Spec Grav, UA: 1.02
Urobilinogen, UA: 0.2
pH, UA: 6

## 2015-12-07 LAB — POCT URINE PREGNANCY: Preg Test, Ur: NEGATIVE

## 2015-12-07 LAB — POC MICROSCOPIC URINALYSIS (UMFC): Mucus: ABSENT

## 2015-12-07 NOTE — Progress Notes (Signed)
Patient ID: Deborah Soto, female   DOB: 1965-09-13, 50 y.o.   MRN: [id number] [facility] [address] [place] [address] [phone]  By signing my name below, I, [name], attest that this documentation has been prepared under the direction and in the presence of Trena Platt, PA-C Electronically Signed: Charline Bills, ED Scribe 12/07/2015 at 11:29 AM.  Date:  12/07/2015   Name:  Deborah Soto   DOB:  10/08/1965   MRN:  [id number]  PCP:  Tonye Pearson, MD   History of Present Illness:  Deborah Soto is a 49 y.o. female patient who presents to [facility] discolored urine first noticed 2 days ago. Pt states that she saw a little discoloration in her urine a few weeks ago but it self-resolved. She has noticed that her urine was pink in color for 1-2 days in late August and noticed that it was brown 2 days ago. She denies malodorous urine, dysuria, urinary frequency, decreased urine output, weakened urinary stream, sore throat or recent URIs. Pt reports occasional sexual activity with her husband who has had a vasectomy. Pt's LNMP was approximately 6 months ago.   Pt also presents with ongoing low abdominal pain for several weeks. She reports associated symptoms of nausea which she attributes to anxiety attacks which have occurred at 6:30 AM daily since late July along with diaphoresis. Pt has been treating anxiety with Buspar for the past 2 weeks. She also reports mild back pain which she suspects is due to falling out of an office chair a few weeks ago while at work. No head injury or LOC. Pr has also noticed some mild aches in her legs and states that her legs sometimes feels like they might "give out on her" when she's walking. She denies fever, sob, new chest tightness.  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   PTE (pulmonary thromboembolism) (HCC) 10/10/2011   Preeclampsia 10/10/2011   Diverticulitis 10/10/2011   Bipolar affective disorder (HCC) 10/10/2011   BMI 34.0-34.9,adult 10/10/2011   AR (allergic rhinitis) 10/10/2011    Past Medical History:  Diagnosis Date   Asthma    Depression    Embolism - blood clot January 1997 & January 2017   Also had LLE DVT in 1997    Past Surgical History:  Procedure Laterality Date   NO PAST SURGERIES      Social History  Substance Use Topics   Smoking status: Former Smoker    Packs/day: 1.00    Years: 20.00    Quit date: 03/12/2004   Smokeless tobacco: Not on file   Alcohol use 0.6 oz/week    1 Cans of beer per week     Comment: 1-2 times a week    Family History  Problem Relation Age of Onset   Dementia Father    Heart disease Father    Clotting disorder Mother    Arthritis Mother    Clotting disorder Sister    Clotting disorder Maternal Grandmother    Clotting disorder Maternal Aunt    Rheumatologic disease Neg Hx     No Known Allergies  Medication list has been reviewed and updated.  Current Outpatient Prescriptions on File Prior to Visit  Medication Sig Dispense Refill  Brexpiprazole (REXULTI) 1 MG TABS Take 1 tablet by mouth daily.     escitalopram (LEXAPRO) 20 MG tablet Take 20 mg by mouth at bedtime.      gabapentin (NEURONTIN) 300 MG capsule Take 300 mg by mouth 3 (three) times daily.      XARELTO 20 MG TABS tablet TAKE 1 TABLET DAILY WITH SUPPER. 90 tablet 1   No current facility-administered medications on file prior to visit.     Review of Systems  Constitutional: Negative for fever.  HENT: Negative for sore throat.   Respiratory: Negative for cough.   Gastrointestinal: Positive for abdominal pain and nausea.  Genitourinary: Negative for dysuria and frequency.  Musculoskeletal: Positive for back  pain and myalgias.    Physical Examination: BP 120/84 (BP Location: Right Arm, Patient Position: Sitting, Cuff Size: Large)    Pulse 75    Temp 98.5 F (36.9 C) (Oral)    Resp 17    Ht 5\' 8"  (1.727 m)    Wt 240 lb (108.9 kg)    SpO2 97%    BMI 36.49 kg/m  Ideal Body Weight: @FLOWAMB ([id number])@([phone])@  Physical Exam  Constitutional: She is oriented to person, place, and time. She appears well-developed and well-nourished. No distress.  HENT:  Head: Normocephalic and atraumatic.  Right Ear: External ear normal.  Left Ear: External ear normal.  Eyes: Conjunctivae and EOM are normal. Pupils are equal, round, and reactive to light.  Cardiovascular: Normal rate.   Pulmonary/Chest: Effort normal. No respiratory distress.  Abdominal: Soft. Bowel sounds are normal. There is tenderness in the suprapubic area and left lower quadrant. There is no guarding.  Neurological: She is alert and oriented to person, place, and time.  Skin: She is not diaphoretic.  Psychiatric: She has a normal mood and affect. Her behavior is normal.   Results for orders placed or performed in visit on 12/07/15  POCT urinalysis dipstick  Result Value Ref Range   Color, UA brown (A) yellow   Clarity, UA cloudy (A) clear   Glucose, UA =250 (A) negative   Bilirubin, UA negative negative   Ketones, POC UA negative negative   Spec Grav, UA 1.020    Blood, UA large (A) negative   pH, UA 6.0    Protein Ur, POC =30 (A) negative   Urobilinogen, UA 0.2    Nitrite, UA Negative Negative   Leukocytes, UA Negative Negative  POCT Microscopic Urinalysis ([facility])  Result Value Ref Range   WBC,UR,HPF,POC None None WBC/hpf   RBC,UR,HPF,POC None None RBC/hpf   Bacteria Few (A) None, Too numerous to count   Mucus Absent Absent   Epithelial Cells, UR Per Microscopy None None, Too numerous to count cells/hpf   Assessment and Plan: Marzetta BoardMichelle Soto is a 50 y.o. female who is here today for cc of brown urine. Kidney stone, vs, kidney  injury, vs urinary tract infection. Will culture.    Advised heavy hydration, and tylenol for pain.  Will follow up pending results.  Vitals wnl.   Abdominal pain, unspecified abdominal location - Plan: POCT urinalysis dipstick, POCT Microscopic Urinalysis ([facility]), POCT CBC, BASIC METABOLIC PANEL WITH GFR, POCT urine pregnancy, Urine culture, CANCELED: Basic metabolic panel  Abnormal urine color - Plan: POCT CBC, BASIC METABOLIC PANEL WITH GFR, POCT urine pregnancy, Urine culture, CANCELED: Basic metabolic panel  Trena PlattStephanie English, PA-C [facility] 12/07/2015 11:29 AM

## 2015-12-07 NOTE — Patient Instructions (Addendum)
  Please hydrate well with water.  I will follow up with the lab results.  If your symptoms worsen with abdominal pain, I need you to return here or proceed to the ED.  You can take tylenol for pain.    IF you received an x-ray today, you will receive an invoice from [facility]. Please contact [facility] at [phone] with questions or concerns regarding your invoice.   IF you received labwork today, you will receive an invoice from [facility]. Please contact [facility] at [phone] with questions or concerns regarding your invoice.   Our billing staff will not be able to assist you with questions regarding bills from these companies.  You will be contacted with the lab results as soon as they are available. The fastest way to get your results is to activate your My Chart account. Instructions are located on the last page of this paperwork. If you have not heard from us regarding the results in 2 weeks, please contact this office.

## 2015-12-08 LAB — URINE CULTURE: Organism ID, Bacteria: NO GROWTH

## 2015-12-15 ENCOUNTER — Ambulatory Visit (INDEPENDENT_AMBULATORY_CARE_PROVIDER_SITE_OTHER): Payer: 59 | Admitting: Physician Assistant

## 2015-12-15 ENCOUNTER — Ambulatory Visit (HOSPITAL_COMMUNITY)
Admission: RE | Admit: 2015-12-15 | Discharge: 2015-12-15 | Disposition: A | Discharge status: Home / Self Care | Payer: 59 | Source: Ambulatory Visit | Attending: Physician Assistant | Admitting: Physician Assistant

## 2015-12-15 ENCOUNTER — Encounter (HOSPITAL_COMMUNITY): Payer: Self-pay

## 2015-12-15 VITALS — BP 118/90 | HR 74 | Temp 98.9°F | Resp 17 | Ht 68.0 in | Wt 236.0 lb

## 2015-12-15 DIAGNOSIS — R1030 Lower abdominal pain, unspecified: Secondary | ICD-10-CM | POA: Diagnosis not present

## 2015-12-15 DIAGNOSIS — K5730 Diverticulosis of large intestine without perforation or abscess without bleeding: Secondary | ICD-10-CM | POA: Insufficient documentation

## 2015-12-15 DIAGNOSIS — R300 Dysuria: Secondary | ICD-10-CM | POA: Diagnosis not present

## 2015-12-15 DIAGNOSIS — K760 Fatty (change of) liver, not elsewhere classified: Secondary | ICD-10-CM | POA: Insufficient documentation

## 2015-12-15 DIAGNOSIS — I700 Atherosclerosis of aorta: Secondary | ICD-10-CM | POA: Diagnosis not present

## 2015-12-15 DIAGNOSIS — R3129 Other microscopic hematuria: Secondary | ICD-10-CM

## 2015-12-15 DIAGNOSIS — N200 Calculus of kidney: Secondary | ICD-10-CM | POA: Diagnosis not present

## 2015-12-15 DIAGNOSIS — K5732 Diverticulitis of large intestine without perforation or abscess without bleeding: Secondary | ICD-10-CM | POA: Diagnosis not present

## 2015-12-15 DIAGNOSIS — N210 Calculus in bladder: Secondary | ICD-10-CM

## 2015-12-15 LAB — POCT URINALYSIS DIP (MANUAL ENTRY)
Glucose, UA: NEGATIVE
Ketones, POC UA: NEGATIVE
Leukocytes, UA: NEGATIVE
Nitrite, UA: NEGATIVE
Protein Ur, POC: 100 — AB
Spec Grav, UA: 1.025
Urobilinogen, UA: 0.2
pH, UA: 5.5

## 2015-12-15 LAB — POCT WET + KOH PREP
Trich by wet prep: ABSENT
Yeast by KOH: ABSENT
Yeast by wet prep: ABSENT

## 2015-12-15 LAB — POC MICROSCOPIC URINALYSIS (UMFC): Mucus: ABSENT

## 2015-12-15 LAB — POCT CBC
Granulocyte percent: 58.7 %G (ref 37–80)
HCT, POC: 39.3 % (ref 37.7–47.9)
Hemoglobin: 14.4 g/dL (ref 12.2–16.2)
Lymph, poc: 3.5 — AB (ref 0.6–3.4)
MCH, POC: 30.5 pg (ref 27–31.2)
MCHC: 36.6 g/dL — AB (ref 31.8–35.4)
MCV: 83.3 fL (ref 80–97)
MID (cbc): 1 — AB (ref 0–0.9)
MPV: 7.9 fL (ref 0–99.8)
POC Granulocyte: 6.3 (ref 2–6.9)
POC LYMPH PERCENT: 32.3 %L (ref 10–50)
POC MID %: 9 %M (ref 0–12)
Platelet Count, POC: 277 10*3/uL (ref 142–424)
RBC: 4.72 M/uL (ref 4.04–5.48)
RDW, POC: 14.4 %
WBC: 10.7 10*3/uL — AB (ref 4.6–10.2)

## 2015-12-15 MED ORDER — CIPROFLOXACIN HCL 500 MG PO TABS: 500.0000 mg | ORAL_TABLET | Freq: Two times a day (BID) | ORAL | 0 refills | Status: DC

## 2015-12-15 MED ORDER — CIPROFLOXACIN HCL 500 MG PO TABS: 500.0000 mg | ORAL_TABLET | Freq: Two times a day (BID) | ORAL | 0 refills | Status: AC

## 2015-12-15 MED ORDER — METRONIDAZOLE 500 MG PO TABS: 500.0000 mg | ORAL_TABLET | Freq: Three times a day (TID) | ORAL | 0 refills | Status: DC

## 2015-12-15 NOTE — Progress Notes (Signed)
[facility] [address] [place] [address] [phone]  Date:  12/15/2015   Name:  Deborah Soto   DOB:  09-Jun-1965   MRN:  [id number]  PCP:  Tonye Pearson, MD    History of Present Illness:  Deborah Soto is a 50 y.o. female patient who presents to [facility] for follow up. Patient was seen about 1 week ago for complaint of brown urine and abdominal pain. It was found to be hematuria. There is possibly noted kidney stone. Urine culture yielded no growth. Metabolic panel was normal with kidney function within normal range. It was advised to heavy hydration ibuprofen or Tylenol for pain.  Patient reports that 4 days ago the brown urine became more prominent.  Then last night she developed horrible lower abdominal pain that had her doubled over.  This was associated with diaphoresis.  The pain eased up, but at 3am in the morning, she woke up to excruciating abdominal pain.  Then she had nausea with emesis.  The pain eased up with hydrocodone.  She denies any fever.  No dysuria, or urinary frequency.  There is no diarrhea or abnormal bloody or black stools. She does not have as much flank pain at this time.     Patient Active Problem List   Diagnosis Date Noted  . Cervical disc disorder with radiculopathy of cervical region 05/12/2015  . Hepatic steatosis 04/18/2015  . Left sided chest pain   . Dyspnea   . Achilles tendonitis 04/08/2012  . 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 PTE (pulmonary thromboembolism) (HCC) 10/10/2011  . Preeclampsia 10/10/2011  . Diverticulitis 10/10/2011  . Bipolar affective disorder (HCC) 10/10/2011  . BMI 34.0-34.9,adult 10/10/2011  . AR (allergic rhinitis) 10/10/2011    Past Medical History:  Diagnosis Date  . Asthma   . Depression   . Embolism - blood clot January 1997 & January 2017   Also had LLE DVT in 1997    Past Surgical History:  Procedure Laterality Date  . NO PAST SURGERIES      Social History  Substance Use  Topics  . Smoking status: Former Smoker    Packs/day: 1.00    Years: 20.00    Quit date: 03/12/2004  . Smokeless tobacco: Not on file  . Alcohol use 0.6 oz/week    1 Cans of beer per week     Comment: 1-2 times a week    Family History  Problem Relation Age of Onset  . Dementia Father   . Heart disease Father   . Clotting disorder Mother   . Arthritis Mother   . Clotting disorder Sister   . Clotting disorder Maternal Grandmother   . Clotting disorder Maternal Aunt   . Rheumatologic disease Neg Hx     No Known Allergies  Medication list has been reviewed and updated.  Current Outpatient Prescriptions on File Prior to Visit  Medication Sig Dispense Refill  . busPIRone (BUSPAR) 30 MG tablet Take 30 mg by mouth 2 (two) times daily.    . clonazePAM (KLONOPIN) 1 MG tablet Take 1 mg by mouth 2 (two) times daily.    Marland Kitchen escitalopram (LEXAPRO) 20 MG tablet Take 20 mg by mouth at bedtime.     Carlena Hurl 20 MG TABS tablet TAKE 1 TABLET DAILY WITH SUPPER. 90 tablet 1   No current facility-administered medications on file prior to visit.     ROS ROS otherwise unremarkable unless listed above.   Physical Examination: BP 118/90 (  BP Location: Right Arm, Patient Position: Sitting, Cuff Size: Large)   Pulse 74   Temp 98.9 F (37.2 C) (Oral)   Resp 17   Ht 5\' 8"  (1.727 m)   Wt 236 lb (107 kg)   SpO2 98%   BMI 35.88 kg/m  Ideal Body Weight: Weight in (lb) to have BMI = 25: 164.1  Physical Exam  Constitutional: She is oriented to person, place, and time. She appears well-developed and well-nourished. No distress.  HENT:  Head: Normocephalic and atraumatic.  Right Ear: External ear normal.  Left Ear: External ear normal.  Eyes: Conjunctivae and EOM are normal. Pupils are equal, round, and reactive to light.  Cardiovascular: Normal rate.   Pulmonary/Chest: Effort normal. No respiratory distress.  Abdominal: Soft. Normal appearance and bowel sounds are normal. There is tenderness in  the suprapubic area and left lower quadrant. There is no guarding, no CVA tenderness, no tenderness at McBurney's point and negative Murphy's sign.  Genitourinary: Vagina normal. Pelvic exam was performed with patient supine. There is no rash on the right labia. There is no rash on the left labia. Uterus is tender. Cervix exhibits motion tenderness. Cervix exhibits no discharge and no friability. Right adnexum displays no mass. Left adnexum displays tenderness. Left adnexum displays no mass and no fullness. No erythema or tenderness in the vagina. No signs of injury around the vagina.  Neurological: She is alert and oriented to person, place, and time.  Skin: She is not diaphoretic.  Psychiatric: She has a normal mood and affect. Her behavior is normal.   Results for orders placed or performed in visit on 12/15/15  POCT Microscopic Urinalysis ([facility])  Result Value Ref Range   WBC,UR,HPF,POC Few (A) None WBC/hpf   RBC,UR,HPF,POC Too numerous to count  (A) None RBC/hpf   Bacteria Few (A) None, Too numerous to count   Mucus Absent Absent   Epithelial Cells, UR Per Microscopy None None, Too numerous to count cells/hpf  POCT urinalysis dipstick  Result Value Ref Range   Color, UA brown (A) yellow   Clarity, UA cloudy (A) clear   Glucose, UA negative negative   Bilirubin, UA small (A) negative   Ketones, POC UA negative negative   Spec Grav, UA 1.025    Blood, UA large (A) negative   pH, UA 5.5    Protein Ur, POC =100 (A) negative   Urobilinogen, UA 0.2    Nitrite, UA Negative Negative   Leukocytes, UA Negative Negative  POCT CBC  Result Value Ref Range   WBC 10.7 (A) 4.6 - 10.2 K/uL   Lymph, poc 3.5 (A) 0.6 - 3.4   POC LYMPH PERCENT 32.3 10 - 50 %L   MID (cbc) 1.0 (A) 0 - 0.9   POC MID % 9.0 0 - 12 %M   POC Granulocyte 6.3 2 - 6.9   Granulocyte percent 58.7 37 - 80 %G   RBC 4.72 4.04 - 5.48 M/uL   Hemoglobin 14.4 12.2 - 16.2 g/dL   HCT, POC 16.1 09.6 - 47.9 %   MCV 83.3 80 - 97 fL    MCH, POC 30.5 27 - 31.2 pg   MCHC 36.6 (A) 31.8 - 35.4 g/dL   RDW, POC 04.5 %   Platelet Count, POC 277 142 - 424 K/uL   MPV 7.9 0 - 99.8 fL  POCT Wet + KOH Prep  Result Value Ref Range   Yeast by KOH Absent Present, Absent   Yeast by wet prep  Absent Present, Absent   WBC by wet prep Many (A) None, Few, Too numerous to count   Clue Cells Wet Prep HPF POC None None, Too numerous to count   Trich by wet prep Absent Present, Absent   Bacteria Wet Prep HPF POC Many (A) None, Few, Too numerous to count   Epithelial Cells By [employer] ([facility]) None None, Few, Too numerous to count   RBC,UR,HPF,POC None None RBC/hpf   Ct Abdomen Pelvis Wo Contrast  Result Date: 12/15/2015 CLINICAL DATA:  50 year old female with history of lower abdominal pain and right-sided flank and back pain for the past 2 months, worse over the past 2 days. Gross hematuria. EXAM: CT ABDOMEN AND PELVIS WITHOUT CONTRAST TECHNIQUE: Multidetector CT imaging of the abdomen and pelvis was performed following the standard protocol without IV contrast. COMPARISON:  No priors. FINDINGS: Lower chest: Unremarkable. Hepatobiliary: Diffuse low attenuation throughout the hepatic parenchyma, compatible with severe hepatic steatosis. Focal areas of fatty sparing, most evident adjacent to the gallbladder fossa. Gallbladder is nearly decompressed. There is intermediate attenuation material throughout the body and fundus of the gallbladder. Several small lower attenuation areas in the gallbladder likely represent noncalcified gallstones. No pericholecystic fluid or inflammatory changes. Pancreas: No pancreatic mass or peripancreatic inflammatory changes on today's noncontrast CT examination. Spleen: Unremarkable. Adrenals/Urinary Tract: There multiple small nonobstructive calculi present within the collecting systems of the kidneys bilaterally, measuring up to 5 mm in the interpolar collecting system of the left kidney. No calculi are noted along the  course of either ureter. However, there is a 5 mm calculus lying in the dependent portion of the urinary bladder. No hydroureteronephrosis or perinephric stranding to indicate urinary tract obstruction at this time. Unenhanced appearance of the kidneys and urinary bladder is otherwise unremarkable. Bilateral adrenal glands are normal in appearance. Stomach/Bowel: The appearance of the stomach is normal. There is no pathologic dilatation of small bowel or colon. Numerous colonic diverticulae are noted, and in the region of the mid sigmoid colon there are surrounding inflammatory changes, concerning for mild acute diverticulitis. No definite findings to suggest diverticular abscess or frank perforation are noted at this time. Normal appendix. Vascular/Lymphatic: Atherosclerotic calcifications are noted throughout the abdominal and pelvic vasculature, without evidence of aneurysm. No lymphadenopathy noted in the abdomen or pelvis. Reproductive: Uterus and ovaries are unremarkable in appearance. Other: No significant volume of ascites.  No pneumoperitoneum. Musculoskeletal: There are no aggressive appearing lytic or blastic lesions noted in the visualized portions of the skeleton. IMPRESSION: 1. Subtle inflammatory changes adjacent to the mid sigmoid colon in the midst of multiple diverticulae, concerning for early acute diverticulitis. No definite diverticular abscess or signs of frank perforation are noted at this time. 2. Multiple nonobstructive calculi within the collecting systems of the kidneys bilaterally measuring up to 5 mm in the interpolar collecting system of the left kidney. In addition, there is a 5 mm calculus lying dependently in the lumen of the urinary bladder. At this time, there are no ureteral stones or findings of urinary tract obstruction. 3. There appears to be a combination of noncalcified gallstones and biliary sludge in the gallbladder. Gallbladder is not distended, and there are no findings  to suggest an acute cholecystitis at this time. 4. Severe hepatic steatosis. 5. Aortic atherosclerosis. Electronically Signed   By: Trudie Reed M.D.   On: 12/15/2015 14:09     Assessment and Plan: Benedicta Sultan is a 50 y.o. female who is here today for cc of lower  abdominal pain. Diff dx: bladder stone, PID, diverticulitis. At this time, we will proceed with pelvis CT.  Gc/chl were obtained today.   Dysuria - Plan: POCT Microscopic Urinalysis ([facility]), POCT urinalysis dipstick, POCT CBC, POCT Wet + KOH Prep, CT Abdomen Pelvis Wo Contrast, Ambulatory referral to Urology  Lower abdominal pain - Plan: CT Abdomen Pelvis Wo Contrast, GC/Chlamydia Probe Amp, Ambulatory referral to Urology  Other microscopic hematuria - Plan: CT Abdomen Pelvis Wo Contrast, Ambulatory referral to Urology  Trena PlattStephanie Celise Bazar, PA-C [facility] 10/6/20179:03 AM  Addendum: CT above obtained.  Advised treatment with metronidazole and the cipro for possible diverticulitis, though this does not answer her hematuria.  We will proceed with evaluation from urology.  Thank you.  Advised continued heavy hydration.

## 2015-12-15 NOTE — Patient Instructions (Signed)
     IF you received an x-ray today, you will receive an invoice from [facility]. Please contact [facility] at [phone] with questions or concerns regarding your invoice.   IF you received labwork today, you will receive an invoice from [facility]. Please contact [facility] at [phone] with questions or concerns regarding your invoice.   Our billing staff will not be able to assist you with questions regarding bills from these companies.  You will be contacted with the lab results as soon as they are available. The fastest way to get your results is to activate your My Chart account. Instructions are located on the last page of this paperwork. If you have not heard from us regarding the results in 2 weeks, please contact this office.      

## 2015-12-16 ENCOUNTER — Ambulatory Visit (INDEPENDENT_AMBULATORY_CARE_PROVIDER_SITE_OTHER): Payer: 59 | Admitting: Family Medicine

## 2015-12-16 ENCOUNTER — Telehealth: Payer: Self-pay

## 2015-12-16 VITALS — BP 120/70 | HR 95 | Temp 99.0°F | Resp 18 | Ht 68.0 in | Wt 239.0 lb

## 2015-12-16 DIAGNOSIS — R52 Pain, unspecified: Secondary | ICD-10-CM

## 2015-12-16 DIAGNOSIS — R509 Fever, unspecified: Secondary | ICD-10-CM

## 2015-12-16 LAB — POCT CBC
Granulocyte percent: 55.4 %G (ref 37–80)
HCT, POC: 39 % (ref 37.7–47.9)
Hemoglobin: 14.1 g/dL (ref 12.2–16.2)
Lymph, poc: 3 (ref 0.6–3.4)
MCH, POC: 30.9 pg (ref 27–31.2)
MCHC: 36.1 g/dL — AB (ref 31.8–35.4)
MCV: 85.4 fL (ref 80–97)
MID (cbc): 0.8 (ref 0–0.9)
MPV: 8.5 fL (ref 0–99.8)
POC Granulocyte: 4.7 (ref 2–6.9)
POC LYMPH PERCENT: 35.1 %L (ref 10–50)
POC MID %: 9.5 %M (ref 0–12)
Platelet Count, POC: 246 10*3/uL (ref 142–424)
RBC: 4.56 M/uL (ref 4.04–5.48)
RDW, POC: 14 %
WBC: 8.5 10*3/uL (ref 4.6–10.2)

## 2015-12-16 LAB — GC/CHLAMYDIA PROBE AMP
CT Probe RNA: NOT DETECTED
GC Probe RNA: NOT DETECTED

## 2015-12-16 LAB — POCT INFLUENZA A/B
Influenza A, POC: NEGATIVE
Influenza B, POC: NEGATIVE

## 2015-12-16 NOTE — Progress Notes (Signed)
Patient ID: Deborah Soto, female    DOB: 1965-12-21, 50 y.o.   MRN: [id number]  PCP: Tonye Pearson, MD  Chief Complaint  Patient presents with  . Medication Reaction    Cipro, started medication yesterday and has been feeling she has a fever and achy     Subjective:   HPI 50 year old presents for evaluation of reaction of medication. Patient reports one day history of feeing achy and feverish.  She thought it was a reaction to Ciprofloxacin but reports taking the medication before without any reaction. She was seen and evaluated by her urologist today and was told that large renal stone had passed and only small stones remained in bladder. She complains of arm, neck, and back, and leg achyness. Subjective fever with chills.   Social History   Social History  . Marital status: Married    Spouse name: N/A  . Number of children: Y  . Years of education: N/A   Occupational History  . admin assist    Social History Main Topics  . Smoking status: Former Smoker    Packs/day: 1.00    Years: 20.00    Quit date: 03/12/2004  . Smokeless tobacco: Never Used  . Alcohol use 0.6 oz/week    1 Cans of beer per week     Comment: 1-2 times a week  . Drug use: No  . Sexual activity: Not on file   Other Topics Concern  . Not on file   Social History Narrative   Originally from [place]. Always lived in [place]. 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 No international travel recently. Previously has been to [place] in May 1996. No mold exposure recently. No bird exposure. Does have multiple pets.    Family History  Problem Relation Age of Onset  . Dementia Father   . Heart disease Father   . Clotting disorder Mother   . Arthritis Mother   . Clotting disorder Sister   . Clotting disorder Maternal Grandmother   . Clotting disorder Maternal Aunt   . Rheumatologic disease Neg Hx     Review of Systems See HPI  Patient Active Problem List   Diagnosis Date Noted  . Cervical  disc disorder with radiculopathy of cervical region 05/12/2015  . Hepatic steatosis 04/18/2015  . Left sided chest pain   . Dyspnea   . Achilles tendonitis 04/08/2012  . 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 PTE (pulmonary thromboembolism) (HCC) 10/10/2011  . Preeclampsia 10/10/2011  . Diverticulitis 10/10/2011  . Bipolar affective disorder (HCC) 10/10/2011  . BMI 34.0-34.9,adult 10/10/2011  . AR (allergic rhinitis) 10/10/2011     Prior to Admission medications   Medication Sig Start Date End Date Taking? Authorizing Provider  busPIRone (BUSPAR) 30 MG tablet Take 30 mg by mouth 2 (two) times daily.   Yes Historical Provider, MD  ciprofloxacin (CIPRO) 500 MG tablet Take 1 tablet (500 mg total) by mouth 2 (two) times daily. 12/15/15 12/29/15 Yes [name] D English, PA  clonazePAM (KLONOPIN) 1 MG tablet Take 1 mg by mouth 2 (two) times daily.   Yes Historical Provider, MD  escitalopram (LEXAPRO) 20 MG tablet Take 20 mg by mouth at bedtime.  03/10/15  Yes Historical Provider, MD  metroNIDAZOLE (FLAGYL) 500 MG tablet Take 1 tablet (500 mg total) by mouth 3 (three) times daily. 12/15/15  Yes [name] D English, PA  XARELTO 20 MG TABS tablet TAKE 1 TABLET DAILY WITH SUPPER. 06/07/15  Yes Elvina Sidle, MD  No Known Allergies     Objective:  Physical Exam  Constitutional: She is oriented to person, place, and time. She appears well-developed and well-nourished.  HENT:  Head: Normocephalic and atraumatic.  Right Ear: External ear normal.  Left Ear: External ear normal.  Eyes: Conjunctivae are normal. Pupils are equal, round, and reactive to light.  Neck: Normal range of motion.  Cardiovascular: Normal rate, regular rhythm, normal heart sounds and intact distal pulses.   Pulmonary/Chest: Effort normal and breath sounds normal.  Abdominal: Soft. Bowel sounds are normal. She exhibits no distension and no mass. There is no tenderness. There is no rebound and no guarding.  Musculoskeletal:  Normal range of motion.  Neurological: She is alert and oriented to person, place, and time.  Skin: Skin is warm. There is erythema.  Facial flushing.  Psychiatric: She has a normal mood and affect. Her behavior is normal. Thought content normal.     Vitals:   12/16/15 1744  BP: 120/70  Pulse: 95  Resp: 18  Temp: 99 F (37.2 C)   Assessment & Plan:  1. Body aches - POCT Influenza A/B - POCT CBC 2. Feels feverish  Patient presented acutely ill on 12/15/15 with a complaint of dysuria, hematuria, possible kidney stones.  She underwent further diagnostic testing and CT scan of abdomen confirmed diverticulitis and multiple kidney stones.  She followed up with urology today and was told that the largest stone was no longer present. She complains of generalized aches, fatigue, and subjective fever. She is negative for influenza and her WBC has declined.   Plan: Continue treatment for diverticulitis as originally prescribed. Obtain rest. RTC if symptoms worsen of if you suddenly develop a fever greater than 101.5 F.  Godfrey PickKimberly S. Tiburcio PeaHarris, MSN, FNP-C [facility]

## 2015-12-16 NOTE — Telephone Encounter (Signed)
Pt was seen here yesterday 12/15/15 by Judeth CornfieldStephanie for Dysuria +4. She was put on several meds yesterday. She says she know feels like she has the flu. She wants to know is she should come in today. I let her know I could make an appointment for her and it was her discretion to come in or not. Please advise at [phone]

## 2015-12-16 NOTE — Patient Instructions (Addendum)
Continue medication regimen as prescribed.  If your symptoms do not improve or worsen within 72 hours return for care on Monday.   Influenza and CBC were negative.   IF you received an x-ray today, you will receive an invoice from [facility]. Please contact [facility] at [phone] with questions or concerns regarding your invoice.   IF you received labwork today, you will receive an invoice from [facility]. Please contact [facility] at [phone] with questions or concerns regarding your invoice.   Our billing staff will not be able to assist you with questions regarding bills from these companies.  You will be contacted with the lab results as soon as they are available. The fastest way to get your results is to activate your My Chart account. Instructions are located on the last page of this paperwork. If you have not heard from [name] regarding the results in 2 weeks, please contact this office.     Diverticulitis Diverticulitis is inflammation or infection of small pouches in your colon that form when you have a condition called diverticulosis. The pouches in your colon are called diverticula. Your colon, or large intestine, is where water is absorbed and stool is formed. Complications of diverticulitis can include:  Bleeding.  Severe infection.  Severe pain.  Perforation of your colon.  Obstruction of your colon. CAUSES  Diverticulitis is caused by bacteria. Diverticulitis happens when stool becomes trapped in diverticula. This allows bacteria to grow in the diverticula, which can lead to inflammation and infection. RISK FACTORS People with diverticulosis are at risk for diverticulitis. Eating a diet that does not include enough fiber from fruits and vegetables may make diverticulitis more likely to develop. SYMPTOMS  Symptoms of diverticulitis may include:  Abdominal pain and tenderness. The pain is normally located on the left  side of the abdomen, but may occur in other areas.  Fever and chills.  Bloating.  Cramping.  Nausea.  Vomiting.  Constipation.  Diarrhea.  Blood in your stool. DIAGNOSIS  Your health care provider will ask you about your medical history and do a physical exam. You may need to have tests done because many medical conditions can cause the same symptoms as diverticulitis. Tests may include:  Blood tests.  Urine tests.  Imaging tests of the abdomen, including X-rays and CT scans. When your condition is under control, your health care provider may recommend that you have a colonoscopy. A colonoscopy can show how severe your diverticula are and whether something else is causing your symptoms. TREATMENT  Most cases of diverticulitis are mild and can be treated at home. Treatment may include:  Taking over-the-counter pain medicines.  Following a clear liquid diet.  Taking antibiotic medicines by mouth for 7-10 days. More severe cases may be treated at a hospital. Treatment may include:  Not eating or drinking.  Taking prescription pain medicine.  Receiving antibiotic medicines through an IV tube.  Receiving fluids and nutrition through an IV tube.  Surgery. HOME CARE INSTRUCTIONS   Follow your health care provider's instructions carefully.  Follow a full liquid diet or other diet as directed by your health care provider. After your symptoms improve, your health care provider may tell you to change your diet. He or she may recommend you eat a high-fiber diet. Fruits and vegetables are good sources of fiber. Fiber makes it easier to pass stool.  Take fiber supplements or probiotics as directed by your health care provider.  Only take medicines as directed by  your health care provider.  Keep all your follow-up appointments. SEEK MEDICAL CARE IF:   Your pain does not improve.  You have a hard time eating food.  Your bowel movements do not return to normal. SEEK  IMMEDIATE MEDICAL CARE IF:   Your pain becomes worse.  Your symptoms do not get better.  Your symptoms suddenly get worse.  You have a fever.  You have repeated vomiting.  You have bloody or black, tarry stools. MAKE SURE YOU:   Understand these instructions.  Will watch your condition.  Will get help right away if you are not doing well or get worse.   This information is not intended to replace advice given to you by your health care provider. Make sure you discuss any questions you have with your health care provider.   Document Released: 12/06/2004 Document Revised: 03/03/2013 Document Reviewed: 01/21/2013 Elsevier Interactive Patient Education 2016 [employer]  diver

## 2015-12-19 NOTE — Telephone Encounter (Signed)
Pt was seen on Friday Feeling better

## 2015-12-23 ENCOUNTER — Telehealth: Payer: Self-pay

## 2015-12-23 ENCOUNTER — Ambulatory Visit (INDEPENDENT_AMBULATORY_CARE_PROVIDER_SITE_OTHER): Payer: 59 | Admitting: Physician Assistant

## 2015-12-23 VITALS — BP 124/80 | HR 85 | Temp 98.4°F | Resp 16 | Ht 68.0 in | Wt 238.2 lb

## 2015-12-23 DIAGNOSIS — R079 Chest pain, unspecified: Secondary | ICD-10-CM | POA: Diagnosis not present

## 2015-12-23 DIAGNOSIS — K5732 Diverticulitis of large intestine without perforation or abscess without bleeding: Secondary | ICD-10-CM

## 2015-12-23 LAB — BASIC METABOLIC PANEL
BUN: 13 mg/dL (ref 7–25)
CO2: 23 mmol/L (ref 20–31)
Calcium: 9.7 mg/dL (ref 8.6–10.2)
Chloride: 106 mmol/L (ref 98–110)
Creat: 0.69 mg/dL (ref 0.50–1.10)
Glucose, Bld: 134 mg/dL — ABNORMAL HIGH (ref 65–99)
Potassium: 4.2 mmol/L (ref 3.5–5.3)
Sodium: 140 mmol/L (ref 135–146)

## 2015-12-23 LAB — CBC
HCT: 44.1 % (ref 35.0–45.0)
Hemoglobin: 14.8 g/dL (ref 11.7–15.5)
MCH: 29.4 pg (ref 27.0–33.0)
MCHC: 33.6 g/dL (ref 32.0–36.0)
MCV: 87.5 fL (ref 80.0–100.0)
MPV: 9.6 fL (ref 7.5–12.5)
Platelets: 267 10*3/uL (ref 140–400)
RBC: 5.04 MIL/uL (ref 3.80–5.10)
RDW: 14.3 % (ref 11.0–15.0)
WBC: 8.2 10*3/uL (ref 3.8–10.8)

## 2015-12-23 MED ORDER — OMEPRAZOLE 20 MG PO CPDR: 20.0000 mg | DELAYED_RELEASE_CAPSULE | Freq: Every day | ORAL | 2 refills | Status: DC

## 2015-12-23 MED ORDER — RANITIDINE HCL 150 MG PO TABS: 150.0000 mg | ORAL_TABLET | Freq: Two times a day (BID) | ORAL | 2 refills | Status: DC

## 2015-12-23 MED ORDER — CYCLOBENZAPRINE HCL 10 MG PO TABS: 5.0000 mg | ORAL_TABLET | Freq: Three times a day (TID) | ORAL | 0 refills | Status: DC | PRN

## 2015-12-23 NOTE — Telephone Encounter (Signed)
Looked in patient's chart and Deborah CornfieldStephanie is checking calcium, PTH, and CBC today.   Called pt but there was no answer.  I left a VM for her to CB.

## 2015-12-23 NOTE — Patient Instructions (Addendum)
Please take the medication as prescribed.  I would like you to start yoga.  There is concern that this is possible musculoskeletal as you are having pain with the deep breaths.  Your last CT for possible embolism was in June.  I do think this is unlikely.     IF you received an x-ray today, you will receive an invoice from [facility]. Please contact [facility] at [phone] with questions or concerns regarding your invoice.   IF you received labwork today, you will receive an invoice from [facility]. Please contact [facility] at [phone] with questions or concerns regarding your invoice.   Our billing staff will not be able to assist you with questions regarding bills from these companies.  You will be contacted with the lab results as soon as they are available. The fastest way to get your results is to activate your My Chart account. Instructions are located on the last page of this paperwork. If you have not heard from us regarding the results in 2 weeks, please contact this office.

## 2015-12-23 NOTE — Telephone Encounter (Signed)
Patient was seen today and wants to know if Deborah Soto is just testing her calcium or just PPH. Please advise!  [phone]

## 2015-12-23 NOTE — Progress Notes (Signed)
[facility] [address] [place] [address] [phone]  By signing my name below, I, Mesha Guinyard, attest that this documentation has been prepared under the direction and in the presence of [place], PA-C. Electronically Signed: Verlee Monte, Medical Scribe. 12/23/15. 9:59 AM.  Date:  12/23/2015   Name:  Deborah Soto   DOB:  Nov 14, 1965   MRN:  [id number]  PCP:  Leandrew Koyanagi, MD   Chief Complaint  Patient presents with   Follow-up    back pain and nausea   Anxiety    would like to get recommendations for medications, patient sees a therapist   medication question    anxiety medications, has tried medication through therapist that has not helped     History of Present Illness:  Deborah Soto is a 50 y.o. female patient who presents to [facility] for back pain and nausea follow-up as well as anxiety. Pt has been experiencing myalgia, chills, and low-grade fever. Pt was told diverticulitis might be that cause of her abdominal pain. Pt went to the urologist and received a x-ray and was told she passed the larger stone by Friday. She has increased her water intake. Pt denies hematuria.  Anxiety/Depression: Pt experiences "panic anxiety" with tremors, nausea, dull chest tightness, occasionally diaphoresis, and palpitations with chest pain that she wakes up to every morning around 6 am since July, but she has been getting up earlier- pt states the severe anxiety started in July. Pt mentions her depression has been getting worse. Symptoms last through the day, and eases in the afternoon. Pt takes klonopin with little relief to her symptoms. Takes lexapro 15 mg and seroquel PRN for sleep, but hates taking it because it makes her feel drunk. Pt stopped taking mirapax and her symptoms stopped when she got off. Pt talked to her psychiatrist in July for her symptoms but since they have worsened she wanted to address it with her other doctors. Pt had  chest tightness in June and had a nl stress test when her symptoms were tested. Pt had a positive lupus test, but it was ruled out for the source of her symptoms. Pt sees her psychiatrist once a month and her psychologist once a week. Pt has a "mindless paper pushing job" that helps distract her from her potential stressors. Pt doesn't exercise. Denies dyspnea, vision changes, pain with breathing, stabbing chest pain, vomiting, heart burn, metal taste in her mouth, and chest wall pain.  Depression screen [facility] 2/9 12/23/2015 12/16/2015 12/15/2015 12/07/2015 08/16/2015  Decreased Interest 0 0 0 0 0  Down, Depressed, Hopeless 0 0 1 0 0  PHQ - 2 Score 0 0 1 0 0  Altered sleeping - - - - 0  Tired, decreased energy - - - - 0  Change in appetite - - - - 0  Feeling bad or failure about yourself  - - - - 0  Trouble concentrating - - - - 0  Moving slowly or fidgety/restless - - - - 0  Suicidal thoughts - - - - 0  PHQ-9 Score - - - - 0  Difficult doing work/chores - - - - Not difficult at all   Patient Active Problem List   Diagnosis Date Noted   Cervical disc disorder with radiculopathy of cervical region 05/12/2015   Hepatic steatosis 04/18/2015   Left sided chest pain    Dyspnea    Achilles tendonitis 04/08/2012   Ankle instability 04/08/2012   PTE (pulmonary  thromboembolism) ([place]) 10/10/2011   Preeclampsia 10/10/2011   Diverticulitis 10/10/2011   Bipolar affective disorder ([place]) 10/10/2011   BMI 34.0-34.9,adult 10/10/2011   AR (allergic rhinitis) 10/10/2011    Past Medical History:  Diagnosis Date   Asthma    Depression    Embolism - blood clot January 1997 & January 2017   Also had LLE DVT in 1997    Past Surgical History:  Procedure Laterality Date   NO PAST SURGERIES      Social History  Substance Use Topics   Smoking status: Former Smoker    Packs/day: 1.00    Years: 20.00    Quit date: 03/12/2004   Smokeless tobacco: Never Used   Alcohol use 0.6 oz/week     1 Cans of beer per week     Comment: 1-2 times a week    Family History  Problem Relation Age of Onset   Dementia Father    Heart disease Father    Clotting disorder Mother    Arthritis Mother    Clotting disorder Sister    Clotting disorder Maternal Grandmother    Clotting disorder Maternal Aunt    Rheumatologic disease Neg Hx     No Known Allergies  Medication list has been reviewed and updated.  Current Outpatient Prescriptions on File Prior to Visit  Medication Sig Dispense Refill   busPIRone (BUSPAR) 30 MG tablet Take 30 mg by mouth 2 (two) times daily.     ciprofloxacin (CIPRO) 500 MG tablet Take 1 tablet (500 mg total) by mouth 2 (two) times daily. 14 tablet 0   clonazePAM (KLONOPIN) 1 MG tablet Take 1 mg by mouth 2 (two) times daily.     escitalopram (LEXAPRO) 20 MG tablet Take 20 mg by mouth at bedtime.      metroNIDAZOLE (FLAGYL) 500 MG tablet Take 1 tablet (500 mg total) by mouth 3 (three) times daily. 21 tablet 0   XARELTO 20 MG TABS tablet TAKE 1 TABLET DAILY WITH SUPPER. 90 tablet 1   No current facility-administered medications on file prior to visit.     Review of Systems  Constitutional: Positive for chills, diaphoresis and fever.  Eyes: Negative for blurred vision and double vision.  Cardiovascular: Positive for palpitations. Negative for chest pain.  Gastrointestinal: Positive for nausea. Negative for heartburn and vomiting.  Genitourinary: Negative for hematuria.  Musculoskeletal: Positive for myalgias.  Neurological: Positive for tremors.  Psychiatric/Behavioral: The patient is nervous/anxious. The patient does not have insomnia.     Physical Examination: BP 124/80 (BP Location: Right Arm, Patient Position: Sitting, Cuff Size: Normal)    Pulse 85    Temp 98.4 F (36.9 C) (Oral)    Resp 16    Ht '5\' 8"'$  (1.727 m)    Wt 238 lb 3.2 oz (108 kg)    SpO2 98%    BMI 36.22 kg/m  Ideal Body Weight: '@FLOWAMB'$ ([id number])@  Physical Exam    Constitutional: She is oriented to person, place, and time. She appears well-developed and well-nourished. No distress.  HENT:  Head: Normocephalic and atraumatic.  Right Ear: External ear normal.  Left Ear: External ear normal.  Eyes: Conjunctivae and EOM are normal. Pupils are equal, round, and reactive to light.  Cardiovascular: Normal rate, regular rhythm and normal heart sounds.  Exam reveals no gallop and no friction rub.   No murmur heard. Pulmonary/Chest: Effort normal and breath sounds normal. No respiratory distress. She has no wheezes. She has no rales. She exhibits no tenderness and  no crepitus.  Neurological: She is alert and oriented to person, place, and time.  Skin: Capillary refill takes less than 2 seconds. She is not diaphoretic.  Psychiatric: She has a normal mood and affect. Her behavior is normal.   Results for orders placed or performed in visit on 12/23/15  CBC  Result Value Ref Range   WBC 8.2 3.8 - 10.8 K/uL   RBC 5.04 3.80 - 5.10 MIL/uL   Hemoglobin 14.8 11.7 - 15.5 g/dL   HCT 44.1 35.0 - 45.0 %   MCV 87.5 80.0 - 100.0 fL   MCH 29.4 27.0 - 33.0 pg   MCHC 33.6 32.0 - 36.0 g/dL   RDW 14.3 11.0 - 15.0 %   Platelets 267 140 - 400 K/uL   MPV 9.6 7.5 - 12.5 fL  Basic metabolic panel  Result Value Ref Range   Sodium 140 135 - 146 mmol/L   Potassium 4.2 3.5 - 5.3 mmol/L   Chloride 106 98 - 110 mmol/L   CO2 23 20 - 31 mmol/L   Glucose, Bld 134 (H) 65 - 99 mg/dL   BUN 13 7 - 25 mg/dL   Creat 0.69 0.50 - 1.10 mg/dL   Calcium 9.7 8.6 - 10.2 mg/dL   Assessment and Plan: Orine Goga is a 50 y.o. female who is here today for cc of chest pain, nausea, and anxiety. At this time, it is difficult to say if current stressors are triggering her anxiety.  I will obtain a PTH at this time, to possibly be the culprit to myriad of symptoms (abdominal pains, nephrolithiasis, body aches, and anxiety)  Chest pain, unspecified type - Plan: EKG 96-PRFF, Basic metabolic  panel, omeprazole (PRILOSEC) 20 MG capsule, ranitidine (ZANTAC) 150 MG tablet, cyclobenzaprine (FLEXERIL) 10 MG tablet, PTH, Intact and Calcium  Diverticulitis of large intestine without perforation or abscess without bleeding - Plan: EKG 12-Lead, CBC  Deborah Drape, PA-C [facility] [place] 10/18/201710:48 AM I personally performed the services described in this documentation, which was scribed in my presence. The recorded information has been reviewed and is accurate.

## 2015-12-26 LAB — PTH, INTACT AND CALCIUM
Calcium: 9.7 mg/dL (ref 8.6–10.2)
PTH: 83 pg/mL — ABNORMAL HIGH (ref 14–64)

## 2015-12-26 NOTE — Telephone Encounter (Signed)
Patient aware via phone jp/cma

## 2015-12-28 ENCOUNTER — Other Ambulatory Visit: Payer: Self-pay | Admitting: Physician Assistant

## 2015-12-28 ENCOUNTER — Other Ambulatory Visit: Payer: Self-pay | Admitting: Family Medicine

## 2015-12-28 DIAGNOSIS — R112 Nausea with vomiting, unspecified: Secondary | ICD-10-CM

## 2015-12-28 DIAGNOSIS — F411 Generalized anxiety disorder: Secondary | ICD-10-CM

## 2015-12-28 DIAGNOSIS — R7989 Other specified abnormal findings of blood chemistry: Secondary | ICD-10-CM

## 2015-12-28 DIAGNOSIS — R109 Unspecified abdominal pain: Secondary | ICD-10-CM

## 2015-12-28 DIAGNOSIS — N200 Calculus of kidney: Secondary | ICD-10-CM

## 2016-01-05 ENCOUNTER — Telehealth: Payer: Self-pay | Admitting: *Deleted

## 2016-01-05 NOTE — Telephone Encounter (Signed)
Patient would like to know the results of her PTH results?

## 2016-01-05 NOTE — Telephone Encounter (Signed)
Spoke to patient, let her know of PTH level, and that endocrinology has been attempting to make an appointment with the patient.  Advised she should contact [facility] herself.  I too, would like us to check in to make sure she is getting an appointment relatively quickly.

## 2016-01-09 ENCOUNTER — Encounter: Payer: Self-pay | Admitting: Endocrinology

## 2016-01-09 ENCOUNTER — Ambulatory Visit (INDEPENDENT_AMBULATORY_CARE_PROVIDER_SITE_OTHER): Payer: 59 | Admitting: Endocrinology

## 2016-01-09 DIAGNOSIS — Z87442 Personal history of urinary calculi: Secondary | ICD-10-CM

## 2016-01-09 DIAGNOSIS — E559 Vitamin D deficiency, unspecified: Secondary | ICD-10-CM | POA: Diagnosis not present

## 2016-01-09 DIAGNOSIS — E213 Hyperparathyroidism, unspecified: Secondary | ICD-10-CM | POA: Diagnosis not present

## 2016-01-09 NOTE — Telephone Encounter (Signed)
Pt completed apt today, according to epic apt desk.

## 2016-01-09 NOTE — Progress Notes (Signed)
Subjective:    Patient ID: Deborah Soto, female    DOB: Aug 31, 1965, 50 y.o.   MRN: [id number]  HPI 2 weeks ago, pt was noted to have slightly elevated PTH level.  She denies h/o the following: bariatric surgery, renal disease, seizures, pancreatitis, heart disease, cancer, osteoporosis, malabsorption, eating disorder, neck surgery, bony fracture, chelation rx, and vitamin-D deficiency.  She has h/o urolithiasis (Ca++ oxalate).  She has myalgias throughout the body, and assoc headache.  She does not take any vit-D supplement.  Past Medical History:  Diagnosis Date  . Asthma   . Depression   . Embolism - blood clot January 1997 & January 2017   Also had LLE DVT in 1997    Past Surgical History:  Procedure Laterality Date  . NO PAST SURGERIES      Social History   Social History  . Marital status: Married    Spouse name: N/A  . Number of children: Y  . Years of education: N/A   Occupational History  . admin assist    Social History Main Topics  . Smoking status: Former Smoker    Packs/day: 1.00    Years: 20.00    Quit date: 03/12/2004  . Smokeless tobacco: Never Used  . Alcohol use 0.6 oz/week    1 Cans of beer per week     Comment: 1-2 times a week  . Drug use: No  . Sexual activity: Not on file   Other Topics Concern  . Not on file   Social History Narrative   Originally from [place]. Always lived in [place]. 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 No international travel recently. Previously has been to [place] in May 1996. No mold exposure recently. No bird exposure. Does have multiple pets.     Current Outpatient Prescriptions on File Prior to Visit  Medication Sig Dispense Refill  . busPIRone (BUSPAR) 30 MG tablet Take 30 mg by mouth 2 (two) times daily.    . clonazePAM (KLONOPIN) 1 MG tablet Take 1 mg by mouth as needed.     . cyclobenzaprine (FLEXERIL) 10 MG tablet Take 0.5-1 tablets (5-10 mg total) by mouth 3 (three) times daily as needed for muscle  spasms. 30 tablet 0  . XARELTO 20 MG TABS tablet TAKE 1 TABLET DAILY WITH SUPPER. 90 tablet 1   No current facility-administered medications on file prior to visit.     No Known Allergies  Family History  Problem Relation Age of Onset  . Dementia Father   . Heart disease Father   . Clotting disorder Mother   . Arthritis Mother   . Clotting disorder Sister   . Clotting disorder Maternal Grandmother   . Clotting disorder Maternal Aunt   . Rheumatologic disease Neg Hx   . Hyperparathyroidism Neg Hx    BP 132/84   Pulse 95   Ht 5\' 8"  (1.727 m)   Wt 243 lb (110.2 kg)   SpO2 97%   BMI 36.95 kg/m   Review of Systems denies cramps, n/v, syncope, cold intolerance, rash, diarrhea, sob, numbness, fever, urinary frequency, rhinorrhea, and easy bruising.  She has anxiety, palpitations, slightly easy bruising, and fatigue.     Objective:   Physical Exam VS: see vs page GEN: no distress HEAD: head: no deformity eyes: no periorbital swelling, no proptosis external nose and ears are normal mouth: no lesion seen NECK: supple, thyroid is not enlarged CHEST WALL: no deformity.  No kyphosis.   LUNGS: clear to auscultation  CV: reg rate and rhythm, no murmur ABD: abdomen is soft, nontender.  no hepatosplenomegaly.  not distended.  no hernia MUSCULOSKELETAL: muscle bulk and strength are grossly normal.  no obvious joint swelling.  gait is normal and steady EXTEMITIES: no edema PULSES: no carotid bruit NEURO:  cn 2-12 grossly intact.   readily moves all 4's.  sensation is intact to touch on all 4's SKIN:  Normal texture and temperature.  No rash or suspicious lesion is visible.   NODES:  None palpable at the neck PSYCH: alert, well-oriented.  Does not appear anxious nor depressed.  Lab Results  Component Value Date   PTH 54 01/09/2016   CALCIUM 9.7 01/09/2016   Vit-D is low  i personally reviewed electrocardiogram tracing (12/23/15): Indication: chest pain Impression: normal      Assessment & Plan:  Vit-D deficiency, new. Hyperparathyroidism, new, prob secondary to the above. I have sent a prescription to your pharmacy, for vit-D Urolithiasis.  This may improve with vit-D supplementation. Recheck labs in 1 month.

## 2016-01-09 NOTE — Patient Instructions (Signed)
blood tests are requested for you today.  We'll let you know about the results. If the vitamin-D is low, I'll prescribe a supplement for you, and recheck the blood tests in 1 month.

## 2016-01-10 LAB — PTH, INTACT AND CALCIUM
Calcium: 9.7 mg/dL (ref 8.6–10.2)
PTH: 54 pg/mL (ref 14–64)

## 2016-01-10 LAB — VITAMIN D 25 HYDROXY (VIT D DEFICIENCY, FRACTURES): VITD: 15.15 ng/mL — ABNORMAL LOW (ref 30.00–100.00)

## 2016-01-10 MED ORDER — VITAMIN D (ERGOCALCIFEROL) 1.25 MG (50000 UNIT) PO CAPS: 50000.0000 [IU] | ORAL_CAPSULE | ORAL | 0 refills | Status: DC

## 2016-01-24 ENCOUNTER — Telehealth: Payer: Self-pay | Admitting: Endocrinology

## 2016-01-24 NOTE — Telephone Encounter (Signed)
See message and please advise, Thanks!  

## 2016-01-24 NOTE — Telephone Encounter (Signed)
This is still a little early.  If you want to speed things up, you could increase the ergocalciferol to 4 times per week, and recheck the blood tests here when you are done

## 2016-01-24 NOTE — Telephone Encounter (Signed)
Pt called in and said that she has been taking the Vitamin D for a while now and wants to know when she will start noticing a difference in how she feels.

## 2016-01-25 ENCOUNTER — Ambulatory Visit (INDEPENDENT_AMBULATORY_CARE_PROVIDER_SITE_OTHER): Payer: 59 | Admitting: Family Medicine

## 2016-01-25 VITALS — BP 140/88 | HR 95 | Temp 98.7°F | Resp 17 | Ht 68.0 in | Wt 245.0 lb

## 2016-01-25 DIAGNOSIS — R52 Pain, unspecified: Secondary | ICD-10-CM | POA: Diagnosis not present

## 2016-01-25 DIAGNOSIS — R202 Paresthesia of skin: Secondary | ICD-10-CM

## 2016-01-25 NOTE — Telephone Encounter (Signed)
I contacted the patient and advised of message via voicemail. Requested a call back from the patient to verify if she would be ok with this recommendation.

## 2016-01-25 NOTE — Patient Instructions (Signed)
It was very good to meet you today!  We are checking the Magnesium and B12 today.  We'll let you know what that shows.  In the meantime, we need to check your results from the rheumatologist. If these are normal do not show any sign of rheumatologic disease that we can talk about treatment for fibromyalgia.

## 2016-01-25 NOTE — Progress Notes (Signed)
   Deborah Soto is a 50 y.o. female who presents [facility] today for body aches and requesting magnesium level:  1.  Body aches:  Ongoing issue for patient, see prior OV notes.  Evidently she saw a rheumatologist but was told she had fibromyalgia.  We do not have these records.  She has been tested for multiple prior electrolyte and vitamin deficiencies, all having been normal except for low Vitamin D.  She has extensive psychiatric history, including ECT.  She is currently on multiple psych meds.    Aches are fairly inconsistent, and include bottoms of feet, shoulders, across chest, upper back, lower back, hips.  Transitory.  Often relieved by oral analgesics.  Denies cramping.  No fevers or chills. Some paresthesias in hands.   ROS as above.    PMH reviewed. Patient is a nonsmoker.   Past Medical History:  Diagnosis Date  . Asthma   . Depression   . Embolism - blood clot January 1997 & January 2017   Also had LLE DVT in 1997   Past Surgical History:  Procedure Laterality Date  . NO PAST SURGERIES      Medications reviewed.    Physical Exam:  BP 140/88 (BP Location: Right Arm, Patient Position: Sitting, Cuff Size: Large)   Pulse 95   Temp 98.7 F (37.1 C) (Oral)   Resp 17   Ht 5\' 8"  (1.727 m)   Wt 245 lb (111.1 kg)   SpO2 97%   BMI 37.25 kg/m  Gen:  Alert, cooperative patient who appears stated age in no acute distress.  Vital signs reviewed. HEENT: EOMI,  MMM.   Psych:  Pleasant and conversant.  Alert and oriented.    Assessment and Plan:  1.  Body aches:   - likely fibromyalgia, especially with associated psychiatric symptoms.  - will check magnesium and B12, which are evidently lowered in family members.  - more likely these will be normal.  I did discuss this with patient, as well as next steps regarding fibromyalgia.   - record release signed for rheumatology.  She has had high ANA titers in past.  Would like to know rheum thought  process.  Consider repeating titers next visit pending results of Rheum records.

## 2016-01-26 LAB — BASIC METABOLIC PANEL
BUN: 13 mg/dL (ref 7–25)
CO2: 25 mmol/L (ref 20–31)
Calcium: 9.7 mg/dL (ref 8.6–10.2)
Chloride: 104 mmol/L (ref 98–110)
Creat: 0.65 mg/dL (ref 0.50–1.10)
Glucose, Bld: 108 mg/dL — ABNORMAL HIGH (ref 65–99)
Potassium: 4.1 mmol/L (ref 3.5–5.3)
Sodium: 138 mmol/L (ref 135–146)

## 2016-01-26 LAB — MAGNESIUM: Magnesium: 1.9 mg/dL (ref 1.5–2.5)

## 2016-01-27 LAB — VITAMIN B12: Vitamin B-12: 278 pg/mL (ref 200–1100)

## 2016-01-30 ENCOUNTER — Other Ambulatory Visit (INDEPENDENT_AMBULATORY_CARE_PROVIDER_SITE_OTHER): Payer: 59

## 2016-01-30 DIAGNOSIS — E213 Hyperparathyroidism, unspecified: Secondary | ICD-10-CM

## 2016-01-30 LAB — VITAMIN D 25 HYDROXY (VIT D DEFICIENCY, FRACTURES): VITD: 33.01 ng/mL (ref 30.00–100.00)

## 2016-02-01 ENCOUNTER — Ambulatory Visit (INDEPENDENT_AMBULATORY_CARE_PROVIDER_SITE_OTHER): Payer: 59 | Admitting: Family Medicine

## 2016-02-01 VITALS — BP 128/82 | HR 100 | Temp 98.7°F | Resp 17 | Ht 68.0 in | Wt 247.0 lb

## 2016-02-01 DIAGNOSIS — M791 Myalgia, unspecified site: Secondary | ICD-10-CM

## 2016-02-01 DIAGNOSIS — F332 Major depressive disorder, recurrent severe without psychotic features: Secondary | ICD-10-CM | POA: Diagnosis not present

## 2016-02-01 LAB — TSH: TSH: 1.11 mIU/L

## 2016-02-01 MED ORDER — TRAMADOL HCL 50 MG PO TABS: 50.0000 mg | ORAL_TABLET | Freq: Three times a day (TID) | ORAL | 0 refills | Status: DC | PRN

## 2016-02-01 NOTE — Progress Notes (Signed)
Deborah Soto is a 50 y.o. female who presents to [facility] today for myalgias:  1.  Myalgias:  These have been longstanding for the past year.  She has also had longstanding and chronic depression, treated with a host of anti-depressants.  She describes shoulder pains, back pains, hip pain, foot pain, leg pains.  She is unable to take NSAIDs due to being on anticoagulation and does not take Tylenol because this does not provide her with any relief.  She states she has pain every day and it is "all over". She also describes tingling the community in her back was her fingers sometimes feet. She has no fevers or chills. No URI symptoms. No chest pain. No shortness of breath. No nausea vomiting.  Psychiatrist increased her to Neurontin to 800 mg TID from 600 mg a week ago for anxiety reduction and pain relief.  She has not noted any difference with this.    ROS as above.  No SI/HI.  PMH reviewed. Patient is a nonsmoker.   Past Medical History:  Diagnosis Date  . Asthma   . Depression   . Embolism - blood clot January 1997 & January 2017   Also had LLE DVT in 1997   Past Surgical History:  Procedure Laterality Date  . NO PAST SURGERIES      Medications reviewed. Current Outpatient Prescriptions  Medication Sig Dispense Refill  . busPIRone (BUSPAR) 30 MG tablet Take 30 mg by mouth 2 (two) times daily.    Marland Kitchen. escitalopram (LEXAPRO) 10 MG tablet Take 10 mg by mouth daily. 1 1/2 tabs daily    . gabapentin (NEURONTIN) 600 MG tablet Take 800 mg by mouth 3 (three) times daily.     . Vitamin D, Ergocalciferol, (DRISDOL) 50000 units CAPS capsule Take 1 capsule (50,000 Units total) by mouth 3 (three) times a week. 12 capsule 0  . XARELTO 20 MG TABS tablet TAKE 1 TABLET DAILY WITH SUPPER. 90 tablet 1  . traMADol (ULTRAM) 50 MG tablet Take 1 tablet (50 mg total) by mouth every 8 (eight) hours as needed. 25 tablet 0   No current facility-administered medications for this  visit.      Physical Exam:  BP 128/82 (BP Location: Right Arm, Patient Position: Sitting, Cuff Size: Normal)   Pulse 100   Temp 98.7 F (37.1 C) (Oral)   Resp 17   Ht 5\' 8"  (1.727 m)   Wt 247 lb (112 kg)   SpO2 97%   BMI 37.56 kg/m  Gen:  Alert, cooperative patient who appears stated age in no acute distress.  Vital signs reviewed. HEENT: EOMI,  MMM  MSK:  Strength 5/5 BL upper and lower extremities.   Neuro: Sensation intact throughout.    Assessment and Plan:  1.  Myalgias:   - She brings paperwork from her rheumatologist today. There were a large number of connective tissue laboratories done. The only thing that was positive was ANA which was titers of 1-80 similar to blood work done here several years ago. Otherwise rheumatoid factor, antiphospholipid, thyroid antibodies are negative. Not tested for was CK. - Plan test CK and repeat thyroid today.   - I did discuss with her that I think this is most likely fibromyalgia. We discussed goals being symptom management and return of functionality. She is not very happy with these goals. -She like to try the increased dose of Neurontin for a few weeks but this is not helping taper off of  this and start something else for fibromyalgia like Sevela.  - In the meantime, I encouraged her to start walking and other activities.  She pushed back against this, saying she hurt too much.  We did discuss the positive cycle of increased activity leading to more pain relief.  In the meantime, she can try Tramadol as needed for pain relief. - she plans to return to see Arvella NighStephanie Harris here

## 2016-02-01 NOTE — Patient Instructions (Addendum)
  It was good to see you again today  We are checking your CK and inflammatory markers.    Try the Tramadol for pain relief when you are really hurting.    Consider weaning down from the Neurontin if it's not working for you, and we can try the [facility] for fibromyalgia.    I do think that physical therapy would be a good idea.   Have a good Thanksgiving!   IF you received an x-ray today, you will receive an invoice from [facility]. Please contact [facility] at [phone] with questions or concerns regarding your invoice.   IF you received labwork today, you will receive an invoice from [facility]. Please contact [facility] at [phone] with questions or concerns regarding your invoice.   Our billing staff will not be able to assist you with questions regarding bills from these companies.  You will be contacted with the lab results as soon as they are available. The fastest way to get your results is to activate your My Chart account. Instructions are located on the last page of this paperwork. If you have not heard from us regarding the results in 2 weeks, please contact this office.

## 2016-02-02 LAB — CK: Total CK: 64 U/L (ref 7–177)

## 2016-02-02 LAB — SEDIMENTATION RATE: Sed Rate: 6 mm/hr (ref 0–20)

## 2016-02-02 LAB — C-REACTIVE PROTEIN: CRP: 11.5 mg/L — ABNORMAL HIGH (ref <8.0)

## 2016-02-03 LAB — PTH, INTACT AND CALCIUM
Calcium: 9.7 mg/dL (ref 8.6–10.2)
PTH: 62 pg/mL (ref 14–64)

## 2016-02-05 ENCOUNTER — Other Ambulatory Visit: Payer: Self-pay | Admitting: Endocrinology

## 2016-02-08 ENCOUNTER — Other Ambulatory Visit: Payer: 59

## 2016-02-24 ENCOUNTER — Ambulatory Visit (INDEPENDENT_AMBULATORY_CARE_PROVIDER_SITE_OTHER): Payer: 59 | Admitting: Physician Assistant

## 2016-02-24 VITALS — BP 120/94 | HR 85 | Temp 98.8°F | Resp 17 | Ht 68.0 in | Wt 249.0 lb

## 2016-02-24 DIAGNOSIS — F319 Bipolar disorder, unspecified: Secondary | ICD-10-CM | POA: Diagnosis not present

## 2016-02-24 DIAGNOSIS — F332 Major depressive disorder, recurrent severe without psychotic features: Secondary | ICD-10-CM

## 2016-02-24 NOTE — Patient Instructions (Addendum)
Will hold off on changing the medication, as Dr. Jennelle Humanottle will make the modifications.      IF you received an x-ray today, you will receive an invoice from [facility]. Please contact [facility] at [phone] with questions or concerns regarding your invoice.   IF you received labwork today, you will receive an invoice from [facility]. Please contact [facility] at [phone] with questions or concerns regarding your invoice.   Our billing staff will not be able to assist you with questions regarding bills from these companies.  You will be contacted with the lab results as soon as they are available. The fastest way to get your results is to activate your My Chart account. Instructions are located on the last page of this paperwork. If you have not heard from us regarding the results in 2 weeks, please contact this office.

## 2016-02-24 NOTE — Progress Notes (Signed)
[facility] [address], [place] [address] [phone]  Date:  02/24/2016   Name:  Deborah BoardMichelle Heinsohn   DOB:  May 10, 1965   MRN:  [id number]  PCP:  Tonye PearsonOLITTLE, ROBERT P, MD (Inactive)    History of Present Illness:  Deborah Soto is a 50 y.o. female patient who [facility]. Patient states that her myalgia is not well controlled with the 800mg  of gabapentin which her psychiatrist, Dr. Jennelle Humanottle prescribed.  She wishes to try a different medication, the Savella.  She is taking the lexapro and Buspar as well.  This has provided some relief to her anxiety and depression yet she states that she still has it.  Though she states her body aches, and mood disorder is still present,  She also states that "this is the best I've felt in years.  She notes no adverse side effects to the therapy.  She notes less anxiety in the mornings than 3 months ago, though she states that this is likely not due to the start of gabapentin. She has no abdominal pain at this time. No urinary complaints. She was to take the drisdol prescribed by rheumatology though, she has yet to pick this up due to not having the time.      Patient Active Problem List   Diagnosis Date Noted  . Hyperparathyroidism (HCC) 01/09/2016  . Cervical disc disorder with radiculopathy of cervical region 05/12/2015  . Hepatic steatosis 04/18/2015  . Left sided chest pain   . Dyspnea   . Achilles tendonitis 04/08/2012  . 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 PTE (pulmonary thromboembolism) (HCC) 10/10/2011  . Preeclampsia 10/10/2011  . Diverticulitis 10/10/2011  . Bipolar affective disorder (HCC) 10/10/2011  . BMI 34.0-34.9,adult 10/10/2011  . AR (allergic rhinitis) 10/10/2011    Past Medical History:  Diagnosis Date  . Asthma   . Depression   . Embolism - blood clot January 1997 & January 2017   Also had LLE DVT in 1997    Past Surgical History:  Procedure Laterality Date  . NO PAST SURGERIES       Social History  Substance Use Topics  . Smoking status: Former Smoker    Packs/day: 1.00    Years: 20.00    Quit date: 03/12/2004  . Smokeless tobacco: Never Used  . Alcohol use 0.6 oz/week    1 Cans of beer per week     Comment: 1-2 times a week    Family History  Problem Relation Age of Onset  . Dementia Father   . Heart disease Father   . Clotting disorder Mother   . Arthritis Mother   . Clotting disorder Sister   . Clotting disorder Maternal Grandmother   . Clotting disorder Maternal Aunt   . Rheumatologic disease Neg Hx   . Hyperparathyroidism Neg Hx     No Known Allergies  Medication list has been reviewed and updated.  Current Outpatient Prescriptions on File Prior to Visit  Medication Sig Dispense Refill  . busPIRone (BUSPAR) 30 MG tablet Take 30 mg by mouth 2 (two) times daily.    Marland Kitchen. escitalopram (LEXAPRO) 10 MG tablet Take 10 mg by mouth daily. 1 1/2 tabs daily    . gabapentin (NEURONTIN) 600 MG tablet Take 800 mg by mouth 3 (three) times daily.     . traMADol (ULTRAM) 50 MG tablet Take 1 tablet (50 mg total) by mouth every 8 (eight) hours as needed. 25 tablet 0  .  XARELTO 20 MG TABS tablet TAKE 1 TABLET DAILY WITH SUPPER. 90 tablet 1   No current facility-administered medications on file prior to visit.     ROS ROS otherwise unremarkable unless listed above  Physical Examination: BP (!) 120/94 (BP Location: Right Arm, Patient Position: Sitting, Cuff Size: Large)   Pulse 85   Temp 98.8 F (37.1 C) (Oral)   Resp 17   Ht 5\' 8"  (1.727 m)   Wt 249 lb (112.9 kg)   SpO2 97%   BMI 37.86 kg/m  Ideal Body Weight: Weight in (lb) to have BMI = 25: 164.1  Physical Exam  Constitutional: She is oriented to person, place, and time. She appears well-developed and well-nourished. No distress.  HENT:  Head: Normocephalic and atraumatic.  Right Ear: External ear normal.  Left Ear: External ear normal.  Eyes: Conjunctivae and EOM are normal. Pupils are equal,  round, and reactive to light.  Cardiovascular: Normal rate.   Pulmonary/Chest: Effort normal. No respiratory distress.  Neurological: She is alert and oriented to person, place, and time.  Skin: Skin is warm and dry. She is not diaphoretic.  Psychiatric: She has a normal mood and affect. Her behavior is normal.     Assessment and Plan: Deborah Soto is a 50 y.o. female who is here today for follow up. Though she denies its efficacy, she also notes significant improvement from years of mood and physiological symptoms.  I consulted with Dr. Jennelle Humanottle, her psychiatrist of [facility]--as I do not want to risk serotonin syndrome with the desire of staying on lexapro and starting Savella.  However Amada JupiterSavella is not under FDA to provide the same efficacy to the anxiety and depression that the lexapro provides.  And with discontinuing gabapentin, as patient desires--she would likely have mood symptoms resurface dramatically.  Discussed with Dr. Jennelle Humancottle via telephone at this visit.  Dr. Jennelle Humanottle agrees, and states that he would like to change her medications if desired followed by counseling of his specialty.  We will defer to his expertise on this.  Advised patient to continue meds.  She will visit with him next week.  She does not wish to be tapered off the gabapentin now, and will follow up with Dr. Jennelle Humanottle.  45 minutes of direct care given to this patient.   Bipolar affective disorder, remission status unspecified (HCC)  Severe episode of recurrent major depressive disorder, without psychotic features (HCC)  Trena PlattStephanie Jane Broughton, PA-C [facility] 12/15/20177:26 PM

## 2016-04-02 ENCOUNTER — Encounter: Payer: Self-pay | Admitting: Physician Assistant

## 2016-04-02 DIAGNOSIS — R768 Other specified abnormal immunological findings in serum: Secondary | ICD-10-CM | POA: Insufficient documentation

## 2016-04-03 ENCOUNTER — Encounter: Payer: Self-pay | Admitting: Physician Assistant

## 2016-04-03 ENCOUNTER — Ambulatory Visit (INDEPENDENT_AMBULATORY_CARE_PROVIDER_SITE_OTHER): Payer: 59 | Admitting: Physician Assistant

## 2016-04-03 VITALS — BP 151/91 | HR 114 | Temp 98.6°F | Resp 16 | Ht 68.0 in | Wt 251.0 lb

## 2016-04-03 DIAGNOSIS — G8929 Other chronic pain: Secondary | ICD-10-CM | POA: Diagnosis not present

## 2016-04-03 DIAGNOSIS — E559 Vitamin D deficiency, unspecified: Secondary | ICD-10-CM | POA: Diagnosis not present

## 2016-04-03 MED ORDER — TRAMADOL HCL 50 MG PO TABS: 50.0000 mg | ORAL_TABLET | Freq: Three times a day (TID) | ORAL | 0 refills | Status: DC | PRN

## 2016-04-03 NOTE — Progress Notes (Signed)
[facility] [address] [place] [address] [phone]  Date:  04/03/2016   Name:  Deborah Soto   DOB:  05/30/1965   MRN:  [id number]  PCP:  No primary care provider on file.    History of Present Illness:  Deborah Soto is a 51 y.o. female patient who presents to [facility] for cc of chronic pain that has worsened. Complains of worsening global pain...she will have random bouts of arms and legs are aching.  That will resolve, but then the sides of abdomen become painful.  Then ankle, shoulder and elbows are painful.  All are without trauma.  No swelling, or warmth to the touch.  Hurts to open a bottle, working with her mouse (computer) at work.   Never a time where the pain is not there.  Stabbing pain in the knees.   NO dysuria, or hematuria, or frequency.   More pain as the day progresses.  As she gets fatigued during the day, the pain is worsened.   fetzima at 20mg  for about 5 weeks.  They are now on 15 of the lexapro.  Rare alcohol She is not exercising at this time.  She may walk her dog for a short distance, then return due to the pain.  Nothing regular. Pain is not distracted by activities, which she states are generally a joy. Psychotherapy includes writing letters to individuals of her past.  She reports that nothing has been unearthed that causes major stress at this time, more than in the past.   She has started the fetzima for about 5 weeks.  Follow up appt. Was counseled last week due to inclement weather.  She has lowered dose of the lexapro 15mg .  fetzima at 20mg .  Reports no side effects.  Currently taking the gabapentin 800mg  three times per day. Last tramadol was yesterday, reports this helps sometimes.      Patient Active Problem List   Diagnosis Date Noted  . Positive ANA (antinuclear antibody) 04/02/2016  . Hyperparathyroidism (HCC) 01/09/2016  . Snoring 09/20/2015  . Cervical disc disorder with radiculopathy of cervical region 05/12/2015   . Hepatic steatosis 04/18/2015  . Left sided chest pain   . Dyspnea   . Achilles tendonitis 04/08/2012  . 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 PTE (pulmonary thromboembolism) (HCC) 10/10/2011  . Preeclampsia 10/10/2011  . Diverticulitis 10/10/2011  . Bipolar affective disorder (HCC) 10/10/2011  . BMI 34.0-34.9,adult 10/10/2011  . AR (allergic rhinitis) 10/10/2011    Past Medical History:  Diagnosis Date  . Asthma   . Depression   . Embolism - blood clot January 1997 & January 2017   Also had LLE DVT in 1997    Past Surgical History:  Procedure Laterality Date  . NO PAST SURGERIES      Social History  Substance Use Topics  . Smoking status: Former Smoker    Packs/day: 1.00    Years: 20.00    Quit date: 03/12/2004  . Smokeless tobacco: Never Used  . Alcohol use 0.6 oz/week    1 Cans of beer per week     Comment: 1-2 times a week    Family History  Problem Relation Age of Onset  . Dementia Father   . Heart disease Father   . Clotting disorder Mother   . Arthritis Mother   . Clotting disorder Sister   . Clotting disorder Maternal Grandmother   . Clotting disorder Maternal Aunt   . Rheumatologic disease Neg Hx   .  Hyperparathyroidism Neg Hx     No Known Allergies  Medication list has been reviewed and updated.  Current Outpatient Prescriptions on File Prior to Visit  Medication Sig Dispense Refill  . busPIRone (BUSPAR) 30 MG tablet Take 30 mg by mouth 2 (two) times daily.    Marland Kitchen. escitalopram (LEXAPRO) 10 MG tablet Take 10 mg by mouth daily. 1 1/2 tabs daily    . gabapentin (NEURONTIN) 600 MG tablet Take 800 mg by mouth 3 (three) times daily.     . traMADol (ULTRAM) 50 MG tablet Take 1 tablet (50 mg total) by mouth every 8 (eight) hours as needed. 25 tablet 0  . XARELTO 20 MG TABS tablet TAKE 1 TABLET DAILY WITH SUPPER. 90 tablet 1   No current facility-administered medications on file prior to visit.     ROS ROS otherwise unremarkable unless listed  above.  Physical Examination: BP (!) 151/91 (BP Location: Right Arm, Patient Position: Sitting, Cuff Size: Small)   Pulse (!) 114   Temp 98.6 F (37 C) (Oral)   Resp 16   Ht 5\' 8"  (1.727 m)   Wt 251 lb (113.9 kg)   SpO2 97%   BMI 38.16 kg/m  Ideal Body Weight: Weight in (lb) to have BMI = 25: 164.1  Physical Exam  Constitutional: She is oriented to person, place, and time. She appears well-developed and well-nourished. No distress.  HENT:  Head: Normocephalic and atraumatic.  Right Ear: External ear normal.  Left Ear: External ear normal.  Eyes: Conjunctivae and EOM are normal. Pupils are equal, round, and reactive to light.  Cardiovascular: Normal rate and regular rhythm.  Exam reveals no friction rub.   No murmur heard. Pulmonary/Chest: Effort normal. No respiratory distress. She has no wheezes.  Musculoskeletal:  No joint tenderness or swelling, or erythema to the areas.  Neurological: She is alert and oriented to person, place, and time.  Skin: Skin is warm and dry. She is not diaphoretic. No erythema.  Psychiatric: She has a normal mood and affect. Her behavior is normal.     Assessment and Plan: Deborah Soto is a 51 y.o. female who is here today for cc of chronic pain worsening. Contacted her psychiatrist, Dr. Jennelle Humanottle.  He has advised to increase the medication by 20mg  every 4 days.  Maximum 80mg  prior to her follow up psych visit in 2 weeks. Refilled tramadol.  Labs obtained.  Patient would like 2nd opinion from rheumatologist of dx of fibromyalgia.  I think that this is fine.  [facility]  Labs obtained.  Other chronic pain - Plan: traMADol (ULTRAM) 50 MG tablet, Ambulatory referral to Rheumatology, Sedimentation Rate, C-reactive protein  Vitamin D deficiency - Plan: VITAMIN D 25 Hydroxy (Vit-D Deficiency, Fractures)  Deborah PlattStephanie Vayla Wilhelmi, PA-C [facility] 1/24/20188:03 AM

## 2016-04-03 NOTE — Patient Instructions (Signed)
Please await contact for rheumatology appointment. I will contact you with the results of your bloodwork, and how the medication will be managed.

## 2016-04-04 LAB — VITAMIN D 25 HYDROXY (VIT D DEFICIENCY, FRACTURES): Vit D, 25-Hydroxy: 31.6 ng/mL (ref 30.0–100.0)

## 2016-04-04 LAB — C-REACTIVE PROTEIN: CRP: 8.5 mg/L — ABNORMAL HIGH (ref 0.0–4.9)

## 2016-04-04 LAB — SEDIMENTATION RATE: Sed Rate: 4 mm/hr (ref 0–32)

## 2016-04-05 ENCOUNTER — Telehealth: Payer: Self-pay

## 2016-04-05 NOTE — Telephone Encounter (Signed)
Pt calling again to ask about labs.

## 2016-04-05 NOTE — Telephone Encounter (Signed)
Called pt to update on rheumatology referral. She should be scheduled within the week.  She asked about lab results, would like a call to discuss once Judeth CornfieldStephanie has been able to review them.

## 2016-04-06 NOTE — Telephone Encounter (Signed)
Please review and advise.

## 2016-04-06 NOTE — Telephone Encounter (Signed)
Deborah CornfieldStephanie  Patient is currently taking 40 mg already.  On the fourth day of taking 80 MG.    Levomilnacipran HCl ER (FETZIMA) 20 MG CP24  Best [phone]

## 2016-04-10 ENCOUNTER — Telehealth: Payer: Self-pay

## 2016-04-10 NOTE — Telephone Encounter (Signed)
Patient is calling to see if English could call her in something for the pain.  She states that the tramadol is not helping with the pain and the facility she was referred to won't be able to see her for the next 4-6 weeks. Please advise   [phone]

## 2016-04-24 ENCOUNTER — Other Ambulatory Visit: Payer: Self-pay | Admitting: Chiropractic Medicine

## 2016-04-24 ENCOUNTER — Ambulatory Visit
Admission: RE | Admit: 2016-04-24 | Discharge: 2016-04-24 | Disposition: A | Discharge status: Home / Self Care | Payer: 59 | Source: Ambulatory Visit | Attending: Chiropractic Medicine | Admitting: Chiropractic Medicine

## 2016-04-24 DIAGNOSIS — M25561 Pain in right knee: Secondary | ICD-10-CM

## 2016-04-26 ENCOUNTER — Telehealth: Payer: Self-pay | Admitting: Emergency Medicine

## 2016-04-26 NOTE — Telephone Encounter (Signed)
Please contact pt and ask how she is doing at this time.  If she would like me to contact her, then I can

## 2016-04-26 NOTE — Telephone Encounter (Signed)
Pt states, she is feeling the same. Pain is constant all over unrelieved by Neurontin Wanted to let you know, she increased Fetzima to 120 mg as of Monday Referral specialist can not schedule her until 6-8 weeks  Wanted to know the next step. Does not want refill Tramadol

## 2016-04-30 ENCOUNTER — Encounter: Payer: Self-pay | Admitting: Physical Medicine & Rehabilitation

## 2016-05-15 ENCOUNTER — Ambulatory Visit (INDEPENDENT_AMBULATORY_CARE_PROVIDER_SITE_OTHER): Payer: 59 | Admitting: Physician Assistant

## 2016-05-15 VITALS — BP 152/100 | HR 136 | Temp 99.2°F | Resp 18 | Ht 68.0 in | Wt 251.2 lb

## 2016-05-15 DIAGNOSIS — Z1231 Encounter for screening mammogram for malignant neoplasm of breast: Secondary | ICD-10-CM | POA: Diagnosis not present

## 2016-05-15 DIAGNOSIS — R768 Other specified abnormal immunological findings in serum: Secondary | ICD-10-CM | POA: Diagnosis not present

## 2016-05-15 DIAGNOSIS — R5383 Other fatigue: Secondary | ICD-10-CM | POA: Diagnosis not present

## 2016-05-15 DIAGNOSIS — M25551 Pain in right hip: Secondary | ICD-10-CM

## 2016-05-15 DIAGNOSIS — M2550 Pain in unspecified joint: Secondary | ICD-10-CM

## 2016-05-15 DIAGNOSIS — Z1239 Encounter for other screening for malignant neoplasm of breast: Secondary | ICD-10-CM

## 2016-05-15 DIAGNOSIS — R7982 Elevated C-reactive protein (CRP): Secondary | ICD-10-CM

## 2016-05-15 DIAGNOSIS — M791 Myalgia, unspecified site: Secondary | ICD-10-CM

## 2016-05-15 DIAGNOSIS — G8929 Other chronic pain: Secondary | ICD-10-CM

## 2016-05-15 MED ORDER — METAXALONE 800 MG PO TABS: 400.0000 mg | ORAL_TABLET | Freq: Three times a day (TID) | ORAL | 1 refills | Status: DC

## 2016-05-15 NOTE — Patient Instructions (Addendum)
Please await contact for the rheumatologist.   Mammogram imaging will contact you.  We can do this later.   Continue to hydrate well. Await contact for appointment for the imaging of your thigh.  I will contact you with those results.    IF you received an x-ray today, you will receive an invoice from [facility]. Please contact [facility] at [phone] with questions or concerns regarding your invoice.   IF you received labwork today, you will receive an invoice from [facility]. Please contact [facility] at [phone] with questions or concerns regarding your invoice.   Our billing staff will not be able to assist you with questions regarding bills from these companies.  You will be contacted with the lab results as soon as they are available. The fastest way to get your results is to activate your My Chart account. Instructions are located on the last page of this paperwork. If you have not heard from us regarding the results in 2 weeks, please contact this office.

## 2016-05-15 NOTE — Progress Notes (Signed)
[facility] [address], [place] [address] [phone]  Date:  05/15/2016   Name:  Deborah Soto   DOB:  May 31, 1965   MRN:  [id number]  PCP:  Deborah PlattStephanie Adali Soto, Deborah Soto    History of Present Illness:  Deborah Soto is a 51 y.o. female patient who [facility]    She has a lot of fatigue and pain. She has more concentrated pain at the right shoulder and arm.   She will get an injection cervical epidural.   She continues to have thigh pain and right knee pain.   She will do a sleep study, as she was given a home device, and reported that there is significant apnea.   She is taking [id number] vitamin D per day.  She is taking tumeric, magnesium, b12 and multi with omega daily   30mg   buspar twice per day.   Tramadol did not help at all.  She was developing stinging pains at night.  Generalized joint and muscle pain.     rehabilitative and physical medicine.  Patient Active Problem List   Diagnosis Date Noted  . Positive ANA (antinuclear antibody) 04/02/2016  . Hyperparathyroidism (HCC) 01/09/2016  . Snoring 09/20/2015  . Cervical disc disorder with radiculopathy of cervical region 05/12/2015  . Hepatic steatosis 04/18/2015  . Left sided chest pain   . Dyspnea   . Achilles tendonitis 04/08/2012  . 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 PTE (pulmonary thromboembolism) (HCC) 10/10/2011  . Preeclampsia 10/10/2011  . Diverticulitis 10/10/2011  . Bipolar affective disorder (HCC) 10/10/2011  . BMI 34.0-34.9,adult 10/10/2011  . AR (allergic rhinitis) 10/10/2011    Past Medical History:  Diagnosis Date  . Asthma   . Depression   . Embolism - blood clot January 1997 & January 2017   Also had LLE DVT in 1997    Past Surgical History:  Procedure Laterality Date  . NO PAST SURGERIES      Social History  Substance Use Topics  . Smoking status: Former Smoker    Packs/day: 1.00    Years: 20.00    Quit date: 03/12/2004  . Smokeless tobacco: Never  Used  . Alcohol use 0.6 oz/week    1 Cans of beer per week     Comment: 1-2 times a week    Family History  Problem Relation Age of Onset  . Dementia Father   . Heart disease Father   . Clotting disorder Mother   . Arthritis Mother   . Clotting disorder Sister   . Clotting disorder Maternal Grandmother   . Clotting disorder Maternal Aunt   . Rheumatologic disease Neg Hx   . Hyperparathyroidism Neg Hx     Allergies  Allergen Reactions  . Tramadol Other (See Comments)    Tingling all over     Medication list has been reviewed and updated.  Current Outpatient Prescriptions on File Prior to Visit  Medication Sig Dispense Refill  . busPIRone (BUSPAR) 30 MG tablet Take 30 mg by mouth 2 (two) times daily.    Deborah Soto Kitchen. escitalopram (LEXAPRO) 10 MG tablet Take 10 mg by mouth at bedtime.     Deborah Soto. XARELTO 20 MG TABS tablet TAKE 1 TABLET DAILY WITH SUPPER. 90 tablet 1   No current facility-administered medications on file prior to visit.     ROS ROS otherwise unremarkable unless listed above.   Physical Examination: BP (!) 152/100 (BP Location: Right Arm, Patient Position: Sitting, Cuff Size: Small)   Pulse Deborah Soto Kitchen(!)  136   Temp 99.2 F (37.3 C) (Oral)   Resp 18   Ht 5\' 8"  (1.727 m)   Wt 251 lb 3.2 oz (113.9 kg)   SpO2 100%   BMI 38.19 kg/m  Ideal Body Weight: Weight in (lb) to have BMI = 25: 164.1  Physical Exam  Constitutional: She is oriented to person, place, and time. She appears well-developed and well-nourished. No distress.  HENT:  Head: Normocephalic and atraumatic.  Right Ear: External ear normal.  Left Ear: External ear normal.  Eyes: Conjunctivae and EOM are normal. Pupils are equal, round, and reactive to light.  Cardiovascular: Regular rhythm.  Tachycardia present.   Pulses:      Radial pulses are 2+ on the right side, and 2+ on the left side.       Dorsalis pedis pulses are 2+ on the right side, and 2+ on the left side.  Pulmonary/Chest: Effort normal. No respiratory  distress.  Neurological: She is alert and oriented to person, place, and time.  Skin: She is not diaphoretic.  Psychiatric: She has a normal mood and affect. Her behavior is normal.     Assessment and Plan: Deborah Soto is a 51 y.o. female who is here today for cc of follow up of her symptoms. Sending second referral to rheumatology at another facility regarding a second opinion which she requested.   Tapering down the  Pain in joint involving right pelvic region and thigh - Plan: [name] RT LOWER EXTREM LTD SOFT TISSUE NON VASCULAR, Ambulatory referral to Rheumatology  Positive ANA (antinuclear antibody) - Plan: Ambulatory referral to Rheumatology  Elevated C-reactive protein (CRP)  Chronic pain of multiple joints - Plan: Ambulatory referral to Rheumatology  Muscle pain  Other fatigue - Plan: Ambulatory referral to Rheumatology  Screening for breast cancer - Plan: MM SCREENING BREAST TOMO BILATERAL  Deborah Soto, Deborah Soto-C [facility] 3/25/201812:28 PM

## 2016-05-18 ENCOUNTER — Emergency Department (HOSPITAL_COMMUNITY): Payer: 59

## 2016-05-18 ENCOUNTER — Emergency Department (HOSPITAL_COMMUNITY)
Admission: EM | Admit: 2016-05-18 | Discharge: 2016-05-18 | Disposition: A | Discharge status: Home / Self Care | Payer: 59 | Attending: Emergency Medicine | Admitting: Emergency Medicine

## 2016-05-18 ENCOUNTER — Encounter (HOSPITAL_COMMUNITY): Payer: Self-pay | Admitting: Emergency Medicine

## 2016-05-18 DIAGNOSIS — Z79899 Other long term (current) drug therapy: Secondary | ICD-10-CM | POA: Diagnosis not present

## 2016-05-18 DIAGNOSIS — Z87891 Personal history of nicotine dependence: Secondary | ICD-10-CM | POA: Insufficient documentation

## 2016-05-18 DIAGNOSIS — Z7901 Long term (current) use of anticoagulants: Secondary | ICD-10-CM | POA: Diagnosis not present

## 2016-05-18 DIAGNOSIS — J45909 Unspecified asthma, uncomplicated: Secondary | ICD-10-CM | POA: Insufficient documentation

## 2016-05-18 DIAGNOSIS — R0789 Other chest pain: Secondary | ICD-10-CM | POA: Diagnosis present

## 2016-05-18 LAB — I-STAT TROPONIN, ED: Troponin i, poc: 0.01 ng/mL (ref 0.00–0.08)

## 2016-05-18 LAB — CBC
HCT: 46.3 % — ABNORMAL HIGH (ref 36.0–46.0)
Hemoglobin: 16.1 g/dL — ABNORMAL HIGH (ref 12.0–15.0)
MCH: 31 pg (ref 26.0–34.0)
MCHC: 34.8 g/dL (ref 30.0–36.0)
MCV: 89.2 fL (ref 78.0–100.0)
Platelets: 267 10*3/uL (ref 150–400)
RBC: 5.19 MIL/uL — ABNORMAL HIGH (ref 3.87–5.11)
RDW: 12.7 % (ref 11.5–15.5)
WBC: 7.3 10*3/uL (ref 4.0–10.5)

## 2016-05-18 LAB — BASIC METABOLIC PANEL
Anion gap: 9 (ref 5–15)
BUN: 17 mg/dL (ref 6–20)
CO2: 25 mmol/L (ref 22–32)
Calcium: 9.8 mg/dL (ref 8.9–10.3)
Chloride: 105 mmol/L (ref 101–111)
Creatinine, Ser: 0.63 mg/dL (ref 0.44–1.00)
GFR calc Af Amer: >60 mL/min (ref >60)
GFR calc non Af Amer: >60 mL/min (ref >60)
Glucose, Bld: 142 mg/dL — ABNORMAL HIGH (ref 65–99)
Potassium: 3.9 mmol/L (ref 3.5–5.1)
Sodium: 139 mmol/L (ref 135–145)

## 2016-05-18 MED ORDER — SODIUM CHLORIDE 0.9 % IV SOLN
Freq: Once | INTRAVENOUS | Status: AC
  Administered 2016-05-18: 13:00:00 via INTRAVENOUS

## 2016-05-18 MED ORDER — SODIUM CHLORIDE 0.9 % IV SOLN
Freq: Once | INTRAVENOUS | Status: AC
  Administered 2016-05-18: 11:00:00 via INTRAVENOUS

## 2016-05-18 NOTE — ED Provider Notes (Addendum)
[facility]-EMERGENCY DEPT Provider Note   CSN: [id number] Arrival date & time: 05/18/16  4098     History   Chief Complaint Chief Complaint  Patient presents with   Hypertension   Headache    HPI Deborah Soto is a 51 y.o. female.  Here with a concern of high blood pressure after getting an elevated reading at the drug store automated machine of 183/112.  She has had a headache for the past few days and is concerned her blood pressure may be elevated due to her Burr Medico that her psychiatrist has prescribed her.  She reports that she has been gradually tapering up to a higher dose over the past few months and when she saw him earlier this week he decreased her dose due to concerns of it causing high blood pressure.  Upon chart review it is noted that her blood pressure has appeared to increase with the dose of her Fetzima.   She reports that over the past week she has had increasing chest pressure and discomfort along with nausea, increased shortness of breath while walking and feeling palpitations.  She reports her chest feels tight, is a 4 out of 10, feels like a constricting band around her chest, has not radiated and nothing makes it better or worse.  She says that when she has had a PE in the past it felt like a sharp stabbing pain and that this feels completely different.  She reports some chronic right thigh pain that has previously been worked up and had a negative duplex scan. She reports that her head has been hurting for the past few weeks, "feels like a tight cap on my head" but denies vision loss.  She denies any neck stiffness or head trauma.         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 Authorizing Provider  busPIRone (BUSPAR) 30 MG tablet Take 30 mg by mouth 2 (two) times daily.   Yes Historical Provider, MD  Cholecalciferol (VITAMIN D3) 5000 units CAPS Take 2 capsules by mouth daily.   Yes Historical Provider, MD  Clindamycin-Benzoyl Per, Refr, gel Apply 1 application topically daily. 05/03/16  Yes Historical Provider, MD  Cyanocobalamin (VITAMIN B-12 CR PO) Take 2 each by mouth daily.   Yes Historical Provider, MD  escitalopram (LEXAPRO) 10 MG tablet Take 10 mg by mouth at bedtime.    Yes Historical Provider, MD  gabapentin (NEURONTIN) 800 MG tablet Take 800 mg by mouth 3 (three) times daily.  Yes Historical Provider, MD  Levomilnacipran HCl ER (FETZIMA) 80 MG CP24 Take 80 mg by mouth daily with breakfast.   Yes Historical Provider, MD  MAGNESIUM PO Take 1 tablet by mouth daily.   Yes Historical Provider, MD  metaxalone (SKELAXIN) 800 MG tablet Take 0.5-1 tablets (400-800 mg total) by mouth 3 (three) times daily. 05/15/16  Yes Collie Siad English, PA  Multiple Vitamin (MULTIVITAMIN WITH MINERALS) TABS tablet Take 1 tablet by mouth daily.   Yes Historical Provider, MD  rivaroxaban (XARELTO) 20 MG TABS tablet Take 20 mg by mouth daily with supper.   Yes Historical Provider, MD  TURMERIC PO Take 1 tablet by mouth daily.   Yes Historical Provider, MD  traMADol  (ULTRAM) 50 MG tablet Take 1 tablet (50 mg total) by mouth every 8 (eight) hours as needed. Patient not taking: Reported on 05/18/2016 04/03/16   [name] D English, PA  XARELTO 20 MG TABS tablet TAKE 1 TABLET DAILY WITH SUPPER. Patient not taking: Reported on 05/18/2016 06/07/15   Elvina Sidle, MD    Family History Family History  Problem Relation Age of Onset   Dementia Father    Heart disease Father    Clotting disorder Mother    Arthritis Mother    Clotting disorder Sister    Clotting disorder Maternal Grandmother    Clotting disorder Maternal Aunt    Rheumatologic disease Neg Hx    Hyperparathyroidism Neg Hx     Social History Social History  Substance Use Topics   Smoking status: Former Smoker    Packs/day: 1.00    Years: 20.00    Quit date: 03/12/2004   Smokeless tobacco: Never Used   Alcohol use 0.6 oz/week    1 Cans of beer per week     Comment: 1-2 times a week     Allergies   Tramadol   Review of Systems Review of Systems  Constitutional: Positive for fatigue. Negative for activity change, appetite change, diaphoresis and fever.  HENT: Negative for sore throat and trouble swallowing.   Eyes: Positive for visual disturbance (Reports vision is slightly more blurry than normal).  Respiratory: Positive for chest tightness and shortness of breath. Negative for cough and wheezing.   Cardiovascular: Positive for chest pain and palpitations. Negative for leg swelling.  Gastrointestinal: Positive for nausea. Negative for abdominal pain, diarrhea and vomiting.  Endocrine: Positive for cold intolerance and heat intolerance.  Genitourinary: Negative for difficulty urinating, dysuria and flank pain.  Musculoskeletal: Positive for arthralgias, back pain and myalgias. Negative for neck pain and neck stiffness.  Neurological: Negative for dizziness and light-headedness.  Psychiatric/Behavioral: The patient is nervous/anxious.      Physical Exam Updated Vital  Signs BP 153/100 (BP Location: Right Arm)    Pulse 103    Temp 98.5 F (36.9 C) (Oral)    Resp 16    Ht 5\' 8"  (1.727 m)    Wt 113.4 kg    SpO2 100%    BMI 38.01 kg/m   Physical Exam  Constitutional: She appears well-developed and well-nourished.  HENT:  Head: Normocephalic and atraumatic.  Eyes: Conjunctivae are normal. Pupils are equal, round, and reactive to light. Right eye exhibits no discharge. Left eye exhibits no discharge. No scleral icterus.  Neck: Normal range of motion. Neck supple.  Cardiovascular: Exam reveals no gallop and no friction rub.   No murmur heard. Tachycardic,   Pulmonary/Chest: Effort normal and breath sounds normal.  Abdominal: Soft. She exhibits no distension. There is  no tenderness.  Neurological: She is alert.  Skin: Skin is warm and dry. No pallor.  Psychiatric: She has a normal mood and affect. Her behavior is normal.  Nursing note and vitals reviewed.    ED Treatments / Results  Labs (all labs ordered are listed, but only abnormal results are displayed) Labs Reviewed  BASIC METABOLIC PANEL - Abnormal; Notable for the following:       Result Value   Glucose, Bld 142 (*)    All other components within normal limits  CBC - Abnormal; Notable for the following:    RBC 5.19 (*)    Hemoglobin 16.1 (*)    HCT 46.3 (*)    All other components within normal limits  I-STAT TROPOININ, ED    EKG  EKG Interpretation  Date/Time:  Friday May 18 2016 11:08:29 EST Ventricular Rate:  109 PR Interval:    QRS Duration: 85 QT Interval:  331 QTC Calculation: 446 R Axis:   3 Text Interpretation:  Sinus tachycardia Multiple premature complexes, vent & supraven Low voltage, precordial leads Abnormal R-wave progression, early transition Borderline T abnormalities, diffuse leads Baseline wander in lead(s) II III aVF V1 Confirmed by Lincoln Brigham [phone]) on 05/18/2016 12:04:40 PM       Radiology Dg Chest 2 View  Result Date: 05/18/2016 CLINICAL DATA:  Chest  pressure EXAM: CHEST  2 VIEW COMPARISON:  08/16/2015 FINDINGS: Heart and mediastinal contours are within normal limits. No focal opacities or effusions. No acute bony abnormality. IMPRESSION: No active cardiopulmonary disease. Electronically Signed   By: Charlett Nose M.D.   On: 05/18/2016 10:53    Procedures Procedures (including critical care time)  Medications Ordered in ED Medications  0.9 %  sodium chloride infusion ( Intravenous Stopped 05/18/16 1229)  0.9 %  sodium chloride infusion ( Intravenous Stopped 05/18/16 1440)     Initial Impression / Assessment and Plan / ED Course  I have reviewed the triage vital signs and the nursing notes.  Pertinent labs & imaging results that were available during my care of the patient were reviewed by me and considered in my medical decision making (see chart for details).  Around 1200 Patient re-checked, reported her pain was still the same, not better.  Patient was noted to be watching the monitor and when it was turned away from her her heart rate dropped 10 bpm.  Patient advised not to watch the monitor.  Second fluid bolus ordered.   Around 1300: The test results were discussed with patient and advised to follow up on the abnormal labs and blood pressure.  At this point her blood pressure has returned to 152/106 (at PCP on 05/15/16 it was 152/100)  1454: The final results were discussed with patient.  Patient was able to ambulate and maintain oxygen stats.  She got tearful when told she was up for discharge and said that "I have had so many tests and no one can find anything wrong with me."  Patient accepting of discharge and given strict return precautions related to her headache, chest tightness along with general return precautions.  Patient is accepting and said that she is "ready to get out of here".    MDM: Patient had multiple complaints today that all seemed to come back to her concern for her high blood pressure.  Labs and imaging  revealed no evidence of end organ damage and physical exam was benign.  As patients blood pressure was back to her baseline (according to PCP  visit two days ago) it was felt that her possible HTN would be better managed outpatient.  Patient did report a headache but said it was not the worse headache of her life, she had no trauma, neck stiffness or vision loss.  I have low suspicion for any intracranial bleed.  Given patient has had the chest pressure for over a week if there was cardiac damage I would have expected to see a positive troponin and/or EKG changes consistent with the damage.  While patient has a history of pulmonary emboli she reports she has been compliant with her xarelto, taking it as directed and not missing any doses.  She also reports that she does not think she has a PE as this is a completely different type of sensation/pain.  The decision not to further pursue PE as a cause was made in conjugation with the patient.   Final Clinical Impressions(s) / ED Diagnoses   Final diagnoses:  Other chest pain    New Prescriptions New Prescriptions   No medications on file     Cristina Gonglizabeth W Hammond, [place] 05/18/16 1632    Tilden FossaElizabeth Burley Kopka, MD 05/20/16 [phone]  Patient chart updated 08/08/20   Tilden Fossaees, Yonatan Guitron, MD 08/08/20 [phone]

## 2016-05-18 NOTE — ED Notes (Signed)
Bed: WA03 Expected date:  Expected time:  Means of arrival:  Comments: Tawanna Coolerodd

## 2016-05-18 NOTE — ED Triage Notes (Signed)
Patient states she has had high blood pressure since Wednesday. States she took her blood pressure this morning at home and it read 183/112 and pulse 118. Patient is also complaining of headache starting 1 week ago.

## 2016-05-22 ENCOUNTER — Telehealth: Payer: Self-pay | Admitting: Physician Assistant

## 2016-05-22 NOTE — Telephone Encounter (Signed)
You can send referral now.  I placed that 1/23, and it was referred to a rehab and physical medicine, instead of rheum that I had requested.  She would like a 2nd opinion on her symptoms, positive ana, and crp

## 2016-05-22 NOTE — Telephone Encounter (Signed)
Pt calling to check on referral to rheum. Once OV notes are completed, I will send her referral to [facility].

## 2016-05-23 ENCOUNTER — Ambulatory Visit (INDEPENDENT_AMBULATORY_CARE_PROVIDER_SITE_OTHER): Payer: 59 | Admitting: Physician Assistant

## 2016-05-23 ENCOUNTER — Encounter: Payer: Self-pay | Admitting: Physician Assistant

## 2016-05-23 VITALS — BP 154/92 | HR 128 | Temp 98.6°F | Resp 16 | Ht 68.0 in | Wt 252.0 lb

## 2016-05-23 DIAGNOSIS — M549 Dorsalgia, unspecified: Secondary | ICD-10-CM

## 2016-05-23 DIAGNOSIS — E1165 Type 2 diabetes mellitus with hyperglycemia: Secondary | ICD-10-CM | POA: Diagnosis not present

## 2016-05-23 DIAGNOSIS — R0602 Shortness of breath: Secondary | ICD-10-CM

## 2016-05-23 LAB — POCT URINALYSIS DIP (MANUAL ENTRY)
Bilirubin, UA: NEGATIVE
Glucose, UA: >=1000 — AB
Leukocytes, UA: NEGATIVE
Nitrite, UA: NEGATIVE
Spec Grav, UA: 1.03
Urobilinogen, UA: 0.2
pH, UA: 5

## 2016-05-23 LAB — GLUCOSE, POCT (MANUAL RESULT ENTRY): POC Glucose: 193 mg/dl — AB (ref 70–99)

## 2016-05-23 MED ORDER — BLOOD GLUCOSE MONITOR KIT: PACK | 0 refills | Status: DC

## 2016-05-23 MED ORDER — METFORMIN HCL 500 MG PO TABS: 500.0000 mg | ORAL_TABLET | Freq: Two times a day (BID) | ORAL | 1 refills | Status: DC

## 2016-05-23 NOTE — Progress Notes (Signed)
PRIMARY CARE AT [place], [place] [id number] [phone]  Date:  05/23/2016   Name:  Deborah Soto   DOB:  [birth year]   MRN:  [id number]  PCP:  Ivar Drape, PA    History of Present Illness:  Deborah Soto is a 51 y.o. female patient who presents to PCP with  Chief Complaint  Patient presents with  . Hospitalization Follow-up    Hypertension, High Pulse Rate   --hospital follow up after findings of elevated BP of 183/112 at a pharmacy BP machine.  Patient reports that she felt head pain and chest pain.  Rechecked at the hospital and blood pressure was reported, 154/92.  She was given iv fluids and BP discharged at 129/91.   Patient reports that she was concerned of blood sugar.  This was checked by Dr. Clovis Pu.  No diarrhea, but increased urination.  Without pain or hematuria.  Mild coughing and dyspnea with exertion with daily activities.  No fever, but feels subjective fever.  No leg pain.  Hx of DVT.  Currently on the xarelto.  She is to have injection tomorrow.      Patient Active Problem List   Diagnosis Date Noted  . Positive ANA (antinuclear antibody) 04/02/2016  . Hyperparathyroidism ([place]) 01/09/2016  . Snoring 09/20/2015  . Cervical disc disorder with radiculopathy of cervical region 05/12/2015  . Hepatic steatosis 04/18/2015  . Left sided chest pain   . Dyspnea   . Achilles tendonitis 04/08/2012  . 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 PTE (pulmonary thromboembolism) ([place]) 10/10/2011  . Preeclampsia 10/10/2011  . Diverticulitis 10/10/2011  . Bipolar affective disorder ([place]) 10/10/2011  . BMI 34.0-34.9,adult 10/10/2011  . AR (allergic rhinitis) 10/10/2011    Past Medical History:  Diagnosis Date  . Asthma   . Depression   . Embolism - blood clot January 1997 & January 2017   Also had LLE DVT in 1997    Past Surgical History:  Procedure Laterality Date  . NO PAST SURGERIES      Social History  Substance Use Topics  . Smoking status:  Former Smoker    Packs/day: 1.00    Years: 20.00    Quit date: 03/12/2004  . Smokeless tobacco: Never Used  . Alcohol use 0.6 oz/week    1 Cans of beer per week     Comment: 1-2 times a week    Family History  Problem Relation Age of Onset  . Dementia Father   . Heart disease Father   . Clotting disorder Mother   . Arthritis Mother   . Clotting disorder Sister   . Clotting disorder Maternal Grandmother   . Clotting disorder Maternal Aunt   . Rheumatologic disease Neg Hx   . Hyperparathyroidism Neg Hx     Allergies  Allergen Reactions  . Tramadol Other (See Comments)    Tingling all over     Medication list has been reviewed and updated.  Current Outpatient Prescriptions on File Prior to Visit  Medication Sig Dispense Refill  . busPIRone (BUSPAR) 30 MG tablet Take 30 mg by mouth 2 (two) times daily.    . Cholecalciferol (VITAMIN D3) 5000 units CAPS Take 2 capsules by mouth daily.    . Clindamycin-Benzoyl Per, Refr, gel Apply 1 application topically daily.    . Cyanocobalamin (VITAMIN B-12 CR PO) Take 2 each by mouth daily.    Marland Kitchen escitalopram (LEXAPRO) 10 MG tablet Take 10 mg by mouth at bedtime.     . gabapentin (  NEURONTIN) 800 MG tablet Take 800 mg by mouth 3 (three) times daily.    . Levomilnacipran HCl ER (FETZIMA) 80 MG CP24 Take 80 mg by mouth daily with breakfast.    . MAGNESIUM PO Take 1 tablet by mouth daily.    . metaxalone (SKELAXIN) 800 MG tablet Take 0.5-1 tablets (400-800 mg total) by mouth 3 (three) times daily. 30 tablet 1  . Multiple Vitamin (MULTIVITAMIN WITH MINERALS) TABS tablet Take 1 tablet by mouth daily.    . rivaroxaban (XARELTO) 20 MG TABS tablet Take 20 mg by mouth daily with supper.    . TURMERIC PO Take 1 tablet by mouth daily.    Deborah Soto 20 MG TABS tablet TAKE 1 TABLET DAILY WITH SUPPER. 90 tablet 1   No current facility-administered medications on file prior to visit.     ROS ROS otherwise unremarkable unless listed above.  Physical  Examination: BP (!) 154/92   Pulse (!) 128   Temp 98.6 F (37 C) (Oral)   Resp 16   Ht '5\' 8"'$  (1.727 m)   Wt 252 lb (114.3 kg)   SpO2 98%   BMI 38.32 kg/m  Ideal Body Weight: Weight in (lb) to have BMI = 25: 164.1  Physical Exam  Constitutional: She is oriented to person, place, and time. She appears well-developed and well-nourished. No distress.  HENT:  Head: Normocephalic and atraumatic.  Right Ear: External ear normal.  Left Ear: External ear normal.  Mouth/Throat: No oropharyngeal exudate.  Eyes: Conjunctivae and EOM are normal. Pupils are equal, round, and reactive to light.  Cardiovascular: Normal rate, regular rhythm and normal heart sounds.  Exam reveals no friction rub.   No murmur heard. Pulmonary/Chest: Effort normal. No respiratory distress.  Neurological: She is alert and oriented to person, place, and time.  Skin: She is not diaphoretic.  Psychiatric: She has a normal mood and affect. Her behavior is normal.   Results for orders placed or performed in visit on 05/23/16  D-dimer, quantitative (not at [facility])  Result Value Ref Range   D-DIMER <.20 0.00 - 0.49 mg/L FEU  BMP8+eGFR  Result Value Ref Range   Glucose 213 (H) 65 - 99 mg/dL   BUN 14 6 - 24 mg/dL   Creatinine, Ser 0.57 0.57 - 1.00 mg/dL   GFR calc non Af Amer 110 >59 mL/min/1.73   GFR calc Af Amer 127 >59 mL/min/1.73   BUN/Creatinine Ratio 25 (H) 9 - 23   Sodium 138 134 - 144 mmol/L   Potassium 4.2 3.5 - 5.2 mmol/L   Chloride 97 96 - 106 mmol/L   CO2 28 18 - 29 mmol/L   Calcium 9.6 8.7 - 10.2 mg/dL  POCT urinalysis dipstick  Result Value Ref Range   Color, UA yellow yellow   Clarity, UA clear clear   Glucose, UA >=1,000 (A) negative   Bilirubin, UA negative negative   Ketones, POC UA trace (5) (A) negative   Spec Grav, UA 1.030 1.003, 1.005, 1.010, 1.015, 1.020, 1.025, 1.030, 1.035   Blood, UA trace-lysed (A) negative   pH, UA 5.0 5.0, 5.5, 6.0, 6.5, 7.0, 7.5, 8.0   Protein Ur, POC trace (A)  negative   Urobilinogen, UA 0.2 0.2, 1.0, negative   Nitrite, UA Negative Negative   Leukocytes, UA Negative Negative  POCT glucose (manual entry)  Result Value Ref Range   POC Glucose 193 (A) 70 - 99 mg/dl      Assessment and Plan: Arlenis Blaydes is a  51 y.o. female who is here today for cc of abdominal pain Glucosuria, with elevated glucose.  Will start on metformin.  a1c obtained by [facility], and we will get the results of these findings.  Starting on metformin 1 every other day to daily following week, then twice per week, the 3rd week, as she is very sensitive to medication changes. ddimer obtained.  If this is negative, she will proceed with injection as this helps her neck pain considerably.  She can go ahead and discontinue the xarelto if ddimer is negative, and allow the 24 hours prior to injection.    Back pain, unspecified back location, unspecified back pain laterality, unspecified chronicity - Plan: POCT urinalysis dipstick, POCT glucose (manual entry), D-dimer, quantitative (not at [facility]), BMP8+eGFR  SOB (shortness of breath) - Plan: POCT glucose (manual entry), D-dimer, quantitative (not at [facility]), BMP8+eGFR  Type 2 diabetes mellitus with hyperglycemia, without long-term current use of insulin ([place]) - Plan: blood glucose meter kit and supplies KIT  Ivar Drape, PA-C [facility] 3/25/201812:35 PM

## 2016-05-23 NOTE — Patient Instructions (Addendum)
Hyperglycemia Hyperglycemia is when the sugar (glucose) level in your blood is too high. It may not cause symptoms. If you do have symptoms, they may include warning signs, such as:  Feeling more thirsty than normal.  Hunger.  Feeling tired.  Needing to pee (urinate) more than normal.  Blurry eyesight (vision). You may get other symptoms as it gets worse, such as:  Dry mouth.  Not being hungry (loss of appetite).  Fruity-smelling breath.  Weakness.  Weight gain or loss that is not planned. Weight loss may be fast.  A tingling or numb feeling in your hands or feet.  Headache.  Skin that does not bounce back quickly when it is lightly pinched and released (poor skin turgor).  Pain in your belly (abdomen).  Cuts or bruises that heal slowly. High blood sugar can happen to people who do or do not have diabetes. High blood sugar can happen slowly or quickly, and it can be an emergency. Follow these instructions at home: General instructions   Take over-the-counter and prescription medicines only as told by your doctor.  Do not use products that contain nicotine or tobacco, such as cigarettes and e-cigarettes. If you need help quitting, ask your doctor.  Limit alcohol intake to no more than 1 drink per day for nonpregnant women and 2 drinks per day for men. One drink equals 12 oz of beer, 5 oz of wine, or 1 oz of hard liquor.  Manage stress. If you need help with this, ask your doctor.  Keep all follow-up visits as told by your doctor. This is important. Eating and drinking   Stay at a healthy weight.  Exercise regularly, as told by your doctor.  Drink enough fluid, especially when you:  Exercise.  Get sick.  Are in hot temperatures.  Eat healthy foods, such as:  Low-fat (lean) proteins.  Complex carbs (complex carbohydrates), such as whole wheat bread or brown rice.  Fresh fruits and vegetables.  Low-fat dairy products.  Healthy fats.  Drink enough  fluid to keep your pee (urine) clear or pale yellow. If you have diabetes:   Make sure you know the symptoms of hyperglycemia.  Follow your diabetes management plan, as told by your doctor. Make sure you:  Take insulin and medicines as told.  Follow your exercise plan.  Follow your meal plan. Eat on time. Do not skip meals.  Check your blood sugar as often as told. Make sure to check before and after exercise. If you exercise longer or in a different way than you normally do, check your blood sugar more often.  Follow your sick day plan whenever you cannot eat or drink normally. Make this plan ahead of time with your doctor.  Share your diabetes management plan with people in your workplace, school, and household.  Check your urine for ketones when you are ill and as told by your doctor.  Carry a card or wear jewelry that says that you have diabetes. Contact a doctor if:  Your blood sugar level is higher than 240 mg/dL (16.1 mmol/L) for 2 days in a row.  You have problems keeping your blood sugar in your target range.  High blood sugar happens often for you. Get help right away if:  You have trouble breathing.  You have a change in how you think, feel, or act (mental status).  You feel sick to your stomach (nauseous), and that feeling does not go away.  You cannot stop throwing up (vomiting). These symptoms  may be an emergency. Do not wait to see if the symptoms will go away. Get medical help right away. Call your local emergency services (911 in the [place] Do not drive yourself to the hospital. Summary  Hyperglycemia is when the sugar (glucose) level in your blood is too high.  High blood sugar can happen to people who do or do not have diabetes.  Make sure you drink enough fluids, eat healthy foods, and exercise regularly.  Contact your doctor if you have problems keeping your blood sugar in your target range. This information is not intended to replace advice given  to you by your health care provider. Make sure you discuss any questions you have with your health care provider. Document Released: 12/24/2008 Document Revised: 11/14/2015 Document Reviewed: 11/14/2015 Elsevier Interactive Patient Education  2017 Elsevier Inc.  Diabetes Mellitus and Food It is important for you to manage your blood sugar (glucose) level. Your blood glucose level can be greatly affected by what you eat. Eating healthier foods in the appropriate amounts throughout the day at about the same time each day will help you control your blood glucose level. It can also help slow or prevent worsening of your diabetes mellitus. Healthy eating may even help you improve the level of your blood pressure and reach or maintain a healthy weight. General recommendations for healthful eating and cooking habits include:  Eating meals and snacks regularly. Avoid going long periods of time without eating to lose weight.  Eating a diet that consists mainly of plant-based foods, such as fruits, vegetables, nuts, legumes, and whole grains.  Using low-heat cooking methods, such as baking, instead of high-heat cooking methods, such as deep frying. Work with your dietitian to make sure you understand how to use the Nutrition Facts information on food labels. How can food affect me? Carbohydrates  Carbohydrates affect your blood glucose level more than any other type of food. Your dietitian will help you determine how many carbohydrates to eat at each meal and teach you how to count carbohydrates. Counting carbohydrates is important to keep your blood glucose at a healthy level, especially if you are using insulin or taking certain medicines for diabetes mellitus. Alcohol  Alcohol can cause sudden decreases in blood glucose (hypoglycemia), especially if you use insulin or take certain medicines for diabetes mellitus. Hypoglycemia can be a life-threatening condition. Symptoms of hypoglycemia (sleepiness,  dizziness, and disorientation) are similar to symptoms of having too much alcohol. If your health care provider has given you approval to drink alcohol, do so in moderation and use the following guidelines:  Women should not have more than one drink per day, and men should not have more than two drinks per day. One drink is equal to:  12 oz of beer.  5 oz of wine.  1 oz of hard liquor.  Do not drink on an empty stomach.  Keep yourself hydrated. Have water, diet soda, or unsweetened iced tea.  Regular soda, juice, and other mixers might contain a lot of carbohydrates and should be counted. What foods are not recommended? As you make food choices, it is important to remember that all foods are not the same. Some foods have fewer nutrients per serving than other foods, even though they might have the same number of calories or carbohydrates. It is difficult to get your body what it needs when you eat foods with fewer nutrients. Examples of foods that you should avoid that are high in calories and carbohydrates but low  in nutrients include:  Trans fats (most processed foods list trans fats on the Nutrition Facts label).  Regular soda.  Juice.  Candy.  Sweets, such as cake, pie, doughnuts, and cookies.  Fried foods. What foods can I eat? Eat nutrient-rich foods, which will nourish your body and keep you healthy. The food you should eat also will depend on several factors, including:  The calories you need.  The medicines you take.  Your weight.  Your blood glucose level.  Your blood pressure level.  Your cholesterol level. You should eat a variety of foods, including:  Protein.  Lean cuts of meat.  Proteins low in saturated fats, such as fish, egg whites, and beans. Avoid processed meats.  Fruits and vegetables.  Fruits and vegetables that may help control blood glucose levels, such as apples, mangoes, and yams.  Dairy products.  Choose fat-free or low-fat dairy  products, such as milk, yogurt, and cheese.  Grains, bread, pasta, and rice.  Choose whole grain products, such as multigrain bread, whole oats, and brown rice. These foods may help control blood pressure.  Fats.  Foods containing healthful fats, such as nuts, avocado, olive oil, canola oil, and fish. Does everyone with diabetes mellitus have the same meal plan? Because every person with diabetes mellitus is different, there is not one meal plan that works for everyone. It is very important that you meet with a dietitian who will help you create a meal plan that is just right for you. This information is not intended to replace advice given to you by your health care provider. Make sure you discuss any questions you have with your health care provider. Document Released: 11/23/2004 Document Revised: 08/04/2015 Document Reviewed: 01/23/2013 Elsevier Interactive Patient Education  2017 [employer]    IF you received an x-ray today, you will receive an invoice from [facility]. Please contact [facility] at [phone] with questions or concerns regarding your invoice.   IF you received labwork today, you will receive an invoice from [facility]. Please contact [facility] at [phone] with questions or concerns regarding your invoice.   Our billing staff will not be able to assist you with questions regarding bills from these companies.  You will be contacted with the lab results as soon as they are available. The fastest way to get your results is to activate your My Chart account. Instructions are located on the last page of this paperwork. If you have not heard from us regarding the results in 2 weeks, please contact this office.

## 2016-05-23 NOTE — Progress Notes (Deleted)
PRIMARY CARE AT [facility] [address], [place] [address] [phone]  Date:  05/23/2016   Name:  Tameko Halder   DOB:  March 04, 1966   MRN:  [id number]  PCP:  Trena Platt, PA    History of Present Illness:  Cate Oravec is a 51 y.o. female patient who presents to PCP with  Chief Complaint  Patient presents with  . Hospitalization Follow-up    Hypertension, High Pulse Rate     She had obtained the a1c and a fasting glucose with Dr. Jennelle Human.   Wt Readings from Last 3 Encounters:  05/23/16 252 lb (114.3 kg)  05/18/16 250 lb (113.4 kg)  05/15/16 251 lb 3.2 oz (113.9 kg)      Patient Active Problem List   Diagnosis Date Noted  . Positive ANA (antinuclear antibody) 04/02/2016  . Hyperparathyroidism (HCC) 01/09/2016  . Snoring 09/20/2015  . Cervical disc disorder with radiculopathy of cervical region 05/12/2015  . Hepatic steatosis 04/18/2015  . Left sided chest pain   . Dyspnea   . Achilles tendonitis 04/08/2012  . 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 PTE (pulmonary thromboembolism) (HCC) 10/10/2011  . Preeclampsia 10/10/2011  . Diverticulitis 10/10/2011  . Bipolar affective disorder (HCC) 10/10/2011  . BMI 34.0-34.9,adult 10/10/2011  . AR (allergic rhinitis) 10/10/2011    Past Medical History:  Diagnosis Date  . Asthma   . Depression   . Embolism - blood clot January 1997 & January 2017   Also had LLE DVT in 1997    Past Surgical History:  Procedure Laterality Date  . NO PAST SURGERIES      Social History  Substance Use Topics  . Smoking status: Former Smoker    Packs/day: 1.00    Years: 20.00    Quit date: 03/12/2004  . Smokeless tobacco: Never Used  . Alcohol use 0.6 oz/week    1 Cans of beer per week     Comment: 1-2 times a week    Family History  Problem Relation Age of Onset  . Dementia Father   . Heart disease Father   . Clotting disorder Mother   . Arthritis Mother   . Clotting disorder Sister   . Clotting disorder Maternal  Grandmother   . Clotting disorder Maternal Aunt   . Rheumatologic disease Neg Hx   . Hyperparathyroidism Neg Hx     Allergies  Allergen Reactions  . Tramadol Other (See Comments)    Tingling all over     Medication list has been reviewed and updated.  Current Outpatient Prescriptions on File Prior to Visit  Medication Sig Dispense Refill  . busPIRone (BUSPAR) 30 MG tablet Take 30 mg by mouth 2 (two) times daily.    . Cholecalciferol (VITAMIN D3) 5000 units CAPS Take 2 capsules by mouth daily.    . Clindamycin-Benzoyl Per, Refr, gel Apply 1 application topically daily.    . Cyanocobalamin (VITAMIN B-12 CR PO) Take 2 each by mouth daily.    Marland Kitchen escitalopram (LEXAPRO) 10 MG tablet Take 10 mg by mouth at bedtime.     . gabapentin (NEURONTIN) 800 MG tablet Take 800 mg by mouth 3 (three) times daily.    . Levomilnacipran HCl ER (FETZIMA) 80 MG CP24 Take 80 mg by mouth daily with breakfast.    . MAGNESIUM PO Take 1 tablet by mouth daily.    . metaxalone (SKELAXIN) 800 MG tablet Take 0.5-1 tablets (400-800 mg total) by mouth 3 (three) times daily. 30 tablet 1  . Multiple Vitamin (  MULTIVITAMIN WITH MINERALS) TABS tablet Take 1 tablet by mouth daily.    . rivaroxaban (XARELTO) 20 MG TABS tablet Take 20 mg by mouth daily with supper.    . TURMERIC PO Take 1 tablet by mouth daily.    Carlena Hurl. XARELTO 20 MG TABS tablet TAKE 1 TABLET DAILY WITH SUPPER. 90 tablet 1   No current facility-administered medications on file prior to visit.     ROS ROS otherwise unremarkable unless listed above.  Physical Examination: BP (!) 154/92   Pulse (!) 128   Temp 98.6 F (37 C) (Oral)   Resp 16   Ht 5\' 8"  (1.727 m)   Wt 252 lb (114.3 kg)   SpO2 98%   BMI 38.32 kg/m  Ideal Body Weight: Weight in (lb) to have BMI = 25: 164.1  Physical Exam   Assessment and Plan: Marzetta BoardMichelle Rotan is a 51 y.o. female who is here today  There are no diagnoses linked to this encounter.  Trena PlattStephanie Avyukt Cimo, PA-C [facility] 05/23/2016 5:24 PM

## 2016-05-24 ENCOUNTER — Encounter: Payer: Self-pay | Admitting: Physical Medicine & Rehabilitation

## 2016-05-24 ENCOUNTER — Encounter: Payer: 59 | Attending: Physical Medicine & Rehabilitation | Admitting: Physical Medicine & Rehabilitation

## 2016-05-24 VITALS — BP 129/91 | HR 113

## 2016-05-24 DIAGNOSIS — F419 Anxiety disorder, unspecified: Secondary | ICD-10-CM | POA: Diagnosis not present

## 2016-05-24 DIAGNOSIS — M797 Fibromyalgia: Secondary | ICD-10-CM

## 2016-05-24 DIAGNOSIS — G894 Chronic pain syndrome: Secondary | ICD-10-CM | POA: Insufficient documentation

## 2016-05-24 DIAGNOSIS — Z8249 Family history of ischemic heart disease and other diseases of the circulatory system: Secondary | ICD-10-CM | POA: Insufficient documentation

## 2016-05-24 DIAGNOSIS — F329 Major depressive disorder, single episode, unspecified: Secondary | ICD-10-CM | POA: Diagnosis not present

## 2016-05-24 DIAGNOSIS — M792 Neuralgia and neuritis, unspecified: Secondary | ICD-10-CM | POA: Diagnosis not present

## 2016-05-24 DIAGNOSIS — E119 Type 2 diabetes mellitus without complications: Secondary | ICD-10-CM | POA: Diagnosis not present

## 2016-05-24 DIAGNOSIS — Z86718 Personal history of other venous thrombosis and embolism: Secondary | ICD-10-CM | POA: Insufficient documentation

## 2016-05-24 DIAGNOSIS — F339 Major depressive disorder, recurrent, unspecified: Secondary | ICD-10-CM | POA: Diagnosis not present

## 2016-05-24 DIAGNOSIS — R269 Unspecified abnormalities of gait and mobility: Secondary | ICD-10-CM | POA: Insufficient documentation

## 2016-05-24 DIAGNOSIS — R531 Weakness: Secondary | ICD-10-CM | POA: Insufficient documentation

## 2016-05-24 DIAGNOSIS — M5412 Radiculopathy, cervical region: Secondary | ICD-10-CM | POA: Diagnosis present

## 2016-05-24 DIAGNOSIS — R079 Chest pain, unspecified: Secondary | ICD-10-CM | POA: Insufficient documentation

## 2016-05-24 DIAGNOSIS — Z832 Family history of diseases of the blood and blood-forming organs and certain disorders involving the immune mechanism: Secondary | ICD-10-CM | POA: Insufficient documentation

## 2016-05-24 DIAGNOSIS — M791 Myalgia, unspecified site: Secondary | ICD-10-CM

## 2016-05-24 DIAGNOSIS — I10 Essential (primary) hypertension: Secondary | ICD-10-CM | POA: Insufficient documentation

## 2016-05-24 DIAGNOSIS — Z8349 Family history of other endocrine, nutritional and metabolic diseases: Secondary | ICD-10-CM | POA: Insufficient documentation

## 2016-05-24 DIAGNOSIS — G479 Sleep disorder, unspecified: Secondary | ICD-10-CM

## 2016-05-24 DIAGNOSIS — Z87891 Personal history of nicotine dependence: Secondary | ICD-10-CM | POA: Insufficient documentation

## 2016-05-24 DIAGNOSIS — R200 Anesthesia of skin: Secondary | ICD-10-CM | POA: Diagnosis not present

## 2016-05-24 DIAGNOSIS — R000 Tachycardia, unspecified: Secondary | ICD-10-CM

## 2016-05-24 DIAGNOSIS — D6859 Other primary thrombophilia: Secondary | ICD-10-CM | POA: Insufficient documentation

## 2016-05-24 DIAGNOSIS — G4733 Obstructive sleep apnea (adult) (pediatric): Secondary | ICD-10-CM | POA: Diagnosis not present

## 2016-05-24 DIAGNOSIS — G8929 Other chronic pain: Secondary | ICD-10-CM | POA: Diagnosis present

## 2016-05-24 DIAGNOSIS — J45909 Unspecified asthma, uncomplicated: Secondary | ICD-10-CM | POA: Diagnosis not present

## 2016-05-24 LAB — BMP8+EGFR
BUN/Creatinine Ratio: 25 — ABNORMAL HIGH (ref 9–23)
BUN: 14 mg/dL (ref 6–24)
CO2: 28 mmol/L (ref 18–29)
Calcium: 9.6 mg/dL (ref 8.7–10.2)
Chloride: 97 mmol/L (ref 96–106)
Creatinine, Ser: 0.57 mg/dL (ref 0.57–1.00)
GFR calc Af Amer: 127 mL/min/{1.73_m2} (ref >59)
GFR calc non Af Amer: 110 mL/min/{1.73_m2} (ref >59)
Glucose: 213 mg/dL — ABNORMAL HIGH (ref 65–99)
Potassium: 4.2 mmol/L (ref 3.5–5.2)
Sodium: 138 mmol/L (ref 134–144)

## 2016-05-24 LAB — D-DIMER, QUANTITATIVE

## 2016-05-24 MED ORDER — METHOCARBAMOL 750 MG PO TABS
750.0000 mg | ORAL_TABLET | Freq: Three times a day (TID) | ORAL | 1 refills | Status: DC | PRN
Start: 2016-05-24 — End: 2016-06-21

## 2016-05-24 MED ORDER — GABAPENTIN 600 MG PO TABS: 1200.0000 mg | ORAL_TABLET | Freq: Three times a day (TID) | ORAL | 1 refills | Status: DC

## 2016-05-24 MED ORDER — LIDOCAINE 5 % EX PTCH: 1.0000 | MEDICATED_PATCH | CUTANEOUS | 0 refills | Status: DC

## 2016-05-24 NOTE — Progress Notes (Addendum)
Subjective:    Patient ID: Deborah Soto, female    DOB: 15-Sep-1965, 51 y.o.   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  She states it hurts all over, repeatedly says it is her "entire body".  Started ~2017.  Denies inciting event.  Progressively getting worse.  Denies alleviating factors.  Heat exacerbates the pain as well as sit/stand.  Constant.  Pt tried tramadol with ?side effects. Has associated numbness in her RUE.  Pain limits her from doing everything. Denies fall. Pt had thigh ultrasound which was negative for DVT.  For her cervical radiculopathy she is get ESIs in her neck with Ortho.  She also saw a Rheumatology and diagnosed with fibromyaglia.  [facility], she is awaiting an appointment.  She has had issues with blood pressure and tachycardia.    Pain Inventory Average Pain 5 Pain Right Now 3 My pain is dull, tingling and aching  In the last 24 hours, has pain interfered with the following? General activity 7 Relation with others 4 Enjoyment of life 8 What TIME of day is your pain at its worst? evening Sleep (in general) Poor  Pain is worse with: . Pain improves with: . Relief from Meds: 0  Mobility walk without assistance ability to climb steps?  yes do you drive?  yes  Function employed # of hrs/week 25  Neuro/Psych weakness numbness depression anxiety  Prior Studies Any changes since last visit?  no  Physicians involved in your care Any changes since last visit?  no   Family History  Problem Relation Age of Onset  . Dementia Father   . Heart disease Father   . Clotting disorder Mother   . Arthritis Mother   . Clotting disorder Sister   . Clotting disorder Maternal Grandmother   . Clotting disorder Maternal Aunt   . Rheumatologic disease Neg Hx   . Hyperparathyroidism Neg Hx    Social History    Social History  . Marital status: Married    Spouse name: N/A  . Number of children: Y  . Years of education: N/A   Occupational History  . admin assist    Social History Main Topics  . Smoking status: Former Smoker    Packs/day: 1.00    Years: 20.00    Quit date: 03/12/2004  . Smokeless tobacco: Never Used  . Alcohol use 0.6 oz/week    1 Cans of beer per week     Comment: 1-2 times a week  . Drug use: No  . Sexual activity: Not Asked   Other Topics Concern  . None   Social History Narrative   Originally from [place]. Always lived in [place]. 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 No international travel recently. Previously has been to [place] in May 1996. No mold exposure recently. No bird exposure. Does have multiple pets.    Past Surgical History:  Procedure Laterality Date  . NO PAST SURGERIES     Past Medical History:  Diagnosis Date  . Asthma   . Depression   . Embolism - blood clot January 1997 & January 2017   Also had LLE DVT in 1997   BP (!) 129/91   Pulse (!) 113   SpO2 96%   Opioid Risk Score:   Fall Risk Score:  `1  Depression screen PHQ 2/9  Depression screen PHQ  2/9 05/15/2016 04/03/2016 02/24/2016 02/01/2016 01/25/2016 12/23/2015 12/16/2015  Decreased Interest 3 3 0 0 0 0 0  Down, Depressed, Hopeless 3 3 1  0 1 0 0  PHQ - 2 Score 6 6 1  0 1 0 0  Altered sleeping 3 - - - - - -  Tired, decreased energy 3 - - - - - -  Change in appetite 2 - - - - - -  Feeling bad or failure about yourself  3 - - - - - -  Trouble concentrating 0 - - - - - -  Moving slowly or fidgety/restless 0 - - - - - -  Suicidal thoughts 0 - - - - - -  PHQ-9 Score 17 - - - - - -  Difficult doing work/chores - - - - - - -  Some recent data might be hidden    Review of Systems  Constitutional: Positive for diaphoresis and unexpected weight change.  HENT: Negative.   Eyes: Negative.   Respiratory: Positive for shortness of breath.   Cardiovascular: Negative.    Gastrointestinal: Negative.   Endocrine: Negative.   Genitourinary: Negative.   Musculoskeletal: Positive for arthralgias, back pain, myalgias and neck pain.  Skin: Negative.   Allergic/Immunologic: Negative.   Neurological: Positive for numbness.  Hematological: Negative.   Psychiatric/Behavioral: Negative.       Objective:   Physical Exam Gen: +Distressed. Vital signs reviewed HENT: Normocephalic, Atraumatic Eyes: EOMI. No discharge.  Cardio: RRR. No JVD. Pulm: B/l clear to auscultation.  Effort normal Abd: Soft, BS+ MSK:  Gait slightly antalgic .   TTP diffusely.    No edema.  Neuro:   Sensation intact to light touch in all LE dermatomes  Reflexes 2+ throughout  Strength  4+/5 in all LE myotomes (limited due to pain)  SLR neg Skin: Warm and Dry. Intact Psych: Flat affect. Unsure of age?    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    1. Chronic pain syndrome with neuropathic pain  Xray knee reviewed, unremarkable. CT abd/pelvis reviewed showing multiple abnormalities.   Labs reviewed  Referral information reviewed  NCCSRS reviewed  UDS performed  Will not order NSAIDs due to Xarelto  Cold does not help, heat exacerbates symptoms  PT states PT aggravated symptoms  See chiropractor, cont   Encouraged pool therapy  Cont to follow with Psychiatrist/Psychology  Will order Lidoderm patch  TENS has been effective for patient, will order  Will increase Gabapentin to 1200 TID  Will change Skelaxin to Robaxin 750 TID PRN   Will consider Cymbalta in future  Will consider Accupuncture  Do not believe Narcotics appropriate given history and working diagnosis   2. Gait abnormality  Pt not interested in assistive device at this time  3. Sleep disturbance with OSA  Will consider Elavil 25mg  qHS in future, but pt with chest pain today  Pt to obtain CPAP next  week  4. Morbid Obesity  Pt saw dietitian and went to bariatric clinic and appetite stimulant  5. Myalgia   See #1  6. Fibromyalgia  Pt currently on Fetzima by Psych, causing BP/HR elevation  Pt to have eval by [facility]  Encouraged activity  7. HTN/Tachycardia with chest pain  Pt recently had workup in ED  Follow up with PCP  Recommend to go to ED if symptoms worsen  8. Hypercoagulable state  On Xarelto  9.  Depression  With 2 hospitalizations  Cont Psychiatry/Pschology

## 2016-05-25 ENCOUNTER — Telehealth: Payer: Self-pay | Admitting: Family Medicine

## 2016-05-25 ENCOUNTER — Other Ambulatory Visit: Payer: Self-pay | Admitting: Physician Assistant

## 2016-05-25 NOTE — Telephone Encounter (Signed)
Pt wanting Deborah Soto to know that [facility] and the earliest was June 12,2018

## 2016-05-28 ENCOUNTER — Ambulatory Visit
Admission: RE | Admit: 2016-05-28 | Discharge: 2016-05-28 | Disposition: A | Discharge status: Home / Self Care | Payer: 59 | Source: Ambulatory Visit | Attending: Physician Assistant | Admitting: Physician Assistant

## 2016-05-30 ENCOUNTER — Emergency Department (HOSPITAL_COMMUNITY)
Admission: EM | Admit: 2016-05-30 | Discharge: 2016-05-30 | Disposition: A | Discharge status: Home / Self Care | Payer: 59 | Attending: Emergency Medicine | Admitting: Emergency Medicine

## 2016-05-30 ENCOUNTER — Encounter: Payer: Self-pay | Admitting: Physician Assistant

## 2016-05-30 ENCOUNTER — Emergency Department (HOSPITAL_COMMUNITY): Payer: 59

## 2016-05-30 ENCOUNTER — Ambulatory Visit (INDEPENDENT_AMBULATORY_CARE_PROVIDER_SITE_OTHER): Payer: 59 | Admitting: Physician Assistant

## 2016-05-30 ENCOUNTER — Ambulatory Visit: Payer: 59 | Admitting: Physical Medicine & Rehabilitation

## 2016-05-30 ENCOUNTER — Encounter (HOSPITAL_COMMUNITY): Payer: Self-pay

## 2016-05-30 VITALS — BP 140/108 | HR 118 | Temp 99.0°F | Resp 16 | Ht 68.5 in | Wt 251.4 lb

## 2016-05-30 DIAGNOSIS — Z87891 Personal history of nicotine dependence: Secondary | ICD-10-CM | POA: Diagnosis not present

## 2016-05-30 DIAGNOSIS — R0789 Other chest pain: Secondary | ICD-10-CM | POA: Diagnosis not present

## 2016-05-30 DIAGNOSIS — Z7984 Long term (current) use of oral hypoglycemic drugs: Secondary | ICD-10-CM | POA: Diagnosis not present

## 2016-05-30 DIAGNOSIS — R0600 Dyspnea, unspecified: Secondary | ICD-10-CM

## 2016-05-30 DIAGNOSIS — R079 Chest pain, unspecified: Secondary | ICD-10-CM | POA: Diagnosis not present

## 2016-05-30 DIAGNOSIS — E118 Type 2 diabetes mellitus with unspecified complications: Secondary | ICD-10-CM

## 2016-05-30 DIAGNOSIS — J45909 Unspecified asthma, uncomplicated: Secondary | ICD-10-CM | POA: Insufficient documentation

## 2016-05-30 DIAGNOSIS — E119 Type 2 diabetes mellitus without complications: Secondary | ICD-10-CM | POA: Insufficient documentation

## 2016-05-30 DIAGNOSIS — R000 Tachycardia, unspecified: Secondary | ICD-10-CM

## 2016-05-30 DIAGNOSIS — Z79899 Other long term (current) drug therapy: Secondary | ICD-10-CM | POA: Diagnosis not present

## 2016-05-30 DIAGNOSIS — R0609 Other forms of dyspnea: Secondary | ICD-10-CM

## 2016-05-30 LAB — BASIC METABOLIC PANEL
Anion gap: 10 (ref 5–15)
BUN: 13 mg/dL (ref 6–20)
CO2: 26 mmol/L (ref 22–32)
Calcium: 9.7 mg/dL (ref 8.9–10.3)
Chloride: 103 mmol/L (ref 101–111)
Creatinine, Ser: 0.59 mg/dL (ref 0.44–1.00)
GFR calc Af Amer: >60 mL/min (ref >60)
GFR calc non Af Amer: >60 mL/min (ref >60)
Glucose, Bld: 118 mg/dL — ABNORMAL HIGH (ref 65–99)
Potassium: 4.2 mmol/L (ref 3.5–5.1)
Sodium: 139 mmol/L (ref 135–145)

## 2016-05-30 LAB — I-STAT TROPONIN, ED
Troponin i, poc: 0 ng/mL (ref 0.00–0.08)
Troponin i, poc: 0 ng/mL (ref 0.00–0.08)

## 2016-05-30 LAB — CBC
HCT: 45.7 % (ref 36.0–46.0)
Hemoglobin: 15.6 g/dL — ABNORMAL HIGH (ref 12.0–15.0)
MCH: 31 pg (ref 26.0–34.0)
MCHC: 34.1 g/dL (ref 30.0–36.0)
MCV: 90.7 fL (ref 78.0–100.0)
Platelets: 275 10*3/uL (ref 150–400)
RBC: 5.04 MIL/uL (ref 3.87–5.11)
RDW: 13.1 % (ref 11.5–15.5)
WBC: 7.8 10*3/uL (ref 4.0–10.5)

## 2016-05-30 LAB — PROTIME-INR
INR: 1.34
Prothrombin Time: 16.7 seconds — ABNORMAL HIGH (ref 11.4–15.2)

## 2016-05-30 LAB — GLUCOSE, POCT (MANUAL RESULT ENTRY): POC Glucose: 120 mg/dl — AB (ref 70–99)

## 2016-05-30 MED ORDER — IOPAMIDOL (ISOVUE-370) INJECTION 76%
INTRAVENOUS | Status: AC
  Administered 2016-05-30: 100 mL
  Filled 2016-05-30: qty 100

## 2016-05-30 NOTE — ED Triage Notes (Signed)
PER EMS: pt from Dr.'s office; was sent here for central CP described as a "tightness" associated with SOB, nausea and lightheadedness; onset yesterday afternoon. VS: BP- 156/91, HR-101, O2-98% RA, RR-18. CBG-120

## 2016-05-30 NOTE — ED Notes (Signed)
Pt reports she has a hx of PEs and DVTs, last PE was last [place]

## 2016-05-30 NOTE — Progress Notes (Signed)
PRIMARY CARE AT [place], [place] [address] [phone]  Date:  05/30/2016   Name:  Deborah Soto   DOB:  11-26-1965   MRN:  [id number]  PCP:  Ivar Drape, PA    History of Present Illness:  Deborah Soto is a 51 y.o. female patient who presents to PCP with  Chief Complaint  Patient presents with  . Palpitations    with chest tightness 05/29/16; knows she is not having a heart attack   --pt complains of heart racing and tightness in chest since last night that progressively worsened.  Pain is at the center of chest and feels like a tightness.  Radiates up the neck.  Associated lightheadedness, diaphoresis.  Pain worsened with movement.  No blurry vision.  She is currently taking a xarelto.  She has noticed a non-productive mild cough.   Blood sugar checked with fasting average at 160s (136-224). No UR symptoms of sore throat or nasal congestion. No sour taste, or regurge.   Dyspnea on exertion with normal activity of climbing stairs.  Worsening symptoms with lying flat.  Just tapered down to '40mg'$  of the fetzima, which may be contributory to her elevated pulse/bp.   Patient Active Problem List   Diagnosis Date Noted  . Positive ANA (antinuclear antibody) 04/02/2016  . Hyperparathyroidism ([place]) 01/09/2016  . Snoring 09/20/2015  . Cervical disc disorder with radiculopathy of cervical region 05/12/2015  . Hepatic steatosis 04/18/2015  . Left sided chest pain   . Dyspnea   . Achilles tendonitis 04/08/2012  . 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 PTE (pulmonary thromboembolism) ([place]) 10/10/2011  . Preeclampsia 10/10/2011  . Diverticulitis 10/10/2011  . Bipolar affective disorder ([place]) 10/10/2011  . BMI 34.0-34.9,adult 10/10/2011  . AR (allergic rhinitis) 10/10/2011    Past Medical History:  Diagnosis Date  . Asthma   . Depression   . Embolism - blood clot January 1997 & January 2017   Also had LLE DVT in 1997    Past Surgical History:  Procedure  Laterality Date  . NO PAST SURGERIES      Social History  Substance Use Topics  . Smoking status: Former Smoker    Packs/day: 1.00    Years: 20.00    Quit date: 03/12/2004  . Smokeless tobacco: Never Used  . Alcohol use 0.6 oz/week    1 Cans of beer per week     Comment: 1-2 times a week    Family History  Problem Relation Age of Onset  . Dementia Father   . Heart disease Father   . Clotting disorder Mother   . Arthritis Mother   . Clotting disorder Sister   . Clotting disorder Maternal Grandmother   . Clotting disorder Maternal Aunt   . Rheumatologic disease Neg Hx   . Hyperparathyroidism Neg Hx     Allergies  Allergen Reactions  . Tramadol Other (See Comments)    Tingling all over     Medication list has been reviewed and updated.  Current Outpatient Prescriptions on File Prior to Visit  Medication Sig Dispense Refill  . busPIRone (BUSPAR) 30 MG tablet Take 30 mg by mouth 2 (two) times daily.    . Cholecalciferol (VITAMIN D3) 5000 units CAPS Take 2 capsules by mouth daily.    . Clindamycin-Benzoyl Per, Refr, gel Apply 1 application topically daily.    . Cyanocobalamin (VITAMIN B-12 CR PO) Take 2 each by mouth daily.    Marland Kitchen escitalopram (LEXAPRO) 10 MG tablet Take 10 mg  by mouth at bedtime.     . gabapentin (NEURONTIN) 600 MG tablet Take 2 tablets (1,200 mg total) by mouth 3 (three) times daily. 180 tablet 1  . Levomilnacipran HCl ER (FETZIMA) 80 MG CP24 Take 80 mg by mouth daily with breakfast.    . MAGNESIUM PO Take 1 tablet by mouth daily.    . metFORMIN (GLUCOPHAGE) 500 MG tablet Take 1 tablet (500 mg total) by mouth 2 (two) times daily with a meal. Initial 1 tablet for the first week. 60 tablet 1  . methocarbamol (ROBAXIN-750) 750 MG tablet Take 1 tablet (750 mg total) by mouth every 8 (eight) hours as needed for muscle spasms. 90 tablet 1  . XARELTO 20 MG TABS tablet TAKE 1 TABLET DAILY WITH SUPPER. 90 tablet 1  . blood glucose meter kit and supplies KIT Dispense  based on patient and insurance preference. Use up to four times daily as directed. (FOR ICD-9 250.00, 250.01). 1 each 0  . lidocaine (LIDODERM) 5 % Place 1 patch onto the skin daily. Remove & Discard patch within 12 hours or as directed by MD (Patient not taking: Reported on 05/30/2016) 30 patch 0  . Multiple Vitamin (MULTIVITAMIN WITH MINERALS) TABS tablet Take 1 tablet by mouth daily.    Glory Rosebush VERIO test strip CHECK BLOOD SUGAR UP TO 4 TIMES A DAY AS DIRECTED. 100 each 3  . TURMERIC PO Take 1 tablet by mouth daily.     No current facility-administered medications on file prior to visit.     ROS ROS otherwise unremarkable unless listed above.  Physical Examination: BP (!) 140/108 (BP Location: Left Arm)   Pulse (!) 118   Temp 99 F (37.2 C) (Oral)   Resp 16   Ht 5' 8.5" (1.74 m)   Wt 251 lb 6.4 oz (114 kg)   SpO2 98%   BMI 37.67 kg/m  Ideal Body Weight: Weight in (lb) to have BMI = 25: 166.5  Physical Exam  Constitutional: She is oriented to person, place, and time. She appears well-developed and well-nourished. No distress.  HENT:  Head: Normocephalic and atraumatic.  Right Ear: External ear normal.  Left Ear: External ear normal.  Eyes: Conjunctivae and EOM are normal. Pupils are equal, round, and reactive to light.  Cardiovascular: Normal rate and regular rhythm.  Exam reveals gallop.   Pulses:      Carotid pulses are 2+ on the right side, and 2+ on the left side.      Radial pulses are 2+ on the right side, and 2+ on the left side.       Dorsalis pedis pulses are 2+ on the right side, and 2+ on the left side.  Pulmonary/Chest: Effort normal. No respiratory distress.  Neurological: She is alert and oriented to person, place, and time.  Skin: She is not diaphoretic.  Psychiatric: She has a normal mood and affect. Her behavior is normal.     Assessment and Plan: Deborah Soto is a 51 y.o. female who is here today for cc of chest pain, dyspnea on exertion and  tachycardic. Diff: possible early signs of failure, PE, cardiovascular event. ekg reviewed by Dr. Mingo Amber and found no acute changes from prior EKG.  Heart rate is elevated and bp consistently elevated.    This will need to be evaluated by cardiology.  Type 2 diabetes mellitus with complication, without long-term current use of insulin (HCC) - Plan: Hemoglobin A1c, EKG 69-SWNI, Basic metabolic panel, POCT glucose (manual entry),  CANCELED: CMP14+EGFR  Chest pain, unspecified type - Plan: EKG 12-Lead, CBC, CANCELED: CMP14+EGFR  Ivar Drape, PA-C [facility] 3/21/20182:10 PM

## 2016-05-30 NOTE — Discharge Instructions (Signed)
Call [facility]  tomorrow to to schedule office visit to be seen by a cardiologist You may need further testing as an outpatient.

## 2016-05-30 NOTE — Patient Instructions (Addendum)
     IF you received an x-ray today, you will receive an invoice from [facility]. Please contact [facility] at [phone] with questions or concerns regarding your invoice.   IF you received labwork today, you will receive an invoice from [facility]. Please contact [facility] at [phone] with questions or concerns regarding your invoice.   Our billing staff will not be able to assist you with questions regarding bills from these companies.  You will be contacted with the lab results as soon as they are available. The fastest way to get your results is to activate your My Chart account. Instructions are located on the last page of this paperwork. If you have not heard from us regarding the results in 2 weeks, please contact this office.    We recommend that you schedule a mammogram for breast cancer screening. Typically, you do not need a referral to do this. Please contact a local imaging center to schedule your mammogram.  [facility] - [phone]  *ask for the Radiology Department [facility]) - [phone] or [phone]  [facility] - [phone] [facility] - [phone] [facility] - [phone]  *ask for the Radiology Department  [facility] - [phone]  *ask for the Radiology Department [facility] - [phone]  *ask for the Mammography Department [facility] - [phone] 

## 2016-05-30 NOTE — ED Notes (Signed)
Pt A&OX4, ambulatory at d/c with steady gait, NAD 

## 2016-05-30 NOTE — ED Provider Notes (Addendum)
[facility] Provider Note   CSN: [id number] Arrival date & time: 05/30/16  1405     History   Chief Complaint Chief Complaint  Patient presents with  . Chest Pain    HPI Deborah Soto is a 51 y.o. female.Plains of anterior chest pain rating her neck and to her upper back onset yesterday pain is intermittent lasting 2 or 3 minutes at a time coming by sweatiness.She reports she's had intermittently in similar to this for the past 2 weeks and she's been referred to her primary care physician has been seen in the emergency department on 05/18/2016 for similar symptoms Nothing makes symptoms better or worse. She's had multiple episodes today. Seen at urgent care prior to coming here. Sent here for further evaluation. Denies noncompliance with Xarelto, however she did miss one dose last week she was waiting to get her shoulder injected  HPI  Past Medical History:  Diagnosis Date  . Asthma   . Depression   . Embolism - blood clot January 1997 & January 2017   Also had LLE DVT in 1997   Chronic pain Patient Active Problem List   Diagnosis Date Noted  . Positive ANA (antinuclear antibody) 04/02/2016  . Hyperparathyroidism ([place]) 01/09/2016  . Snoring 09/20/2015  . Cervical disc disorder with radiculopathy of cervical region 05/12/2015  . Hepatic steatosis 04/18/2015  . Left sided chest pain   . Dyspnea   . Achilles tendonitis 04/08/2012  . 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 PTE (pulmonary thromboembolism) ([place]) 10/10/2011  . Preeclampsia 10/10/2011  . Diverticulitis 10/10/2011  . Bipolar affective disorder ([place]) 10/10/2011  . BMI 34.0-34.9,adult 10/10/2011  . AR (allergic rhinitis) 10/10/2011    Past Surgical History:  Procedure Laterality Date  . NO PAST SURGERIES      OB History    No data available       Home Medications    Prior to Admission medications   Medication Sig Start Date End Date Taking? Authorizing Provider  blood glucose meter kit and  supplies KIT Dispense based on patient and insurance preference. Use up to four times daily as directed. (FOR ICD-9 250.00, 250.01). 05/23/16   Dorian Heckle English, PA  busPIRone (BUSPAR) 30 MG tablet Take 30 mg by mouth 2 (two) times daily.    Historical Provider, MD  Cholecalciferol (VITAMIN D3) 5000 units CAPS Take 2 capsules by mouth daily.    Historical Provider, MD  Clindamycin-Benzoyl Per, Refr, gel Apply 1 application topically daily. 05/03/16   Historical Provider, MD  Cyanocobalamin (VITAMIN B-12 CR PO) Take 2 each by mouth daily.    Historical Provider, MD  escitalopram (LEXAPRO) 10 MG tablet Take 10 mg by mouth at bedtime.     Historical Provider, MD  gabapentin (NEURONTIN) 600 MG tablet Take 2 tablets (1,200 mg total) by mouth 3 (three) times daily. 05/24/16 06/23/16  Ankit Lorie Phenix, MD  Levomilnacipran HCl ER (FETZIMA) 80 MG CP24 Take 80 mg by mouth daily with breakfast.    Historical Provider, MD  lidocaine (LIDODERM) 5 % Place 1 patch onto the skin daily. Remove & Discard patch within 12 hours or as directed by MD Patient not taking: Reported on 05/30/2016 05/24/16   Ankit Lorie Phenix, MD  MAGNESIUM PO Take 1 tablet by mouth daily.    Historical Provider, MD  metFORMIN (GLUCOPHAGE) 500 MG tablet Take 1 tablet (500 mg total) by mouth 2 (two) times daily with a meal. Initial 1 tablet for the first week. 05/23/16   Colletta Maryland  D English, PA  methocarbamol (ROBAXIN-750) 750 MG tablet Take 1 tablet (750 mg total) by mouth every 8 (eight) hours as needed for muscle spasms. 05/24/16   Ankit Lorie Phenix, MD  Multiple Vitamin (MULTIVITAMIN WITH MINERALS) TABS tablet Take 1 tablet by mouth daily.    Historical Provider, MD  ONETOUCH VERIO test strip CHECK BLOOD SUGAR UP TO 4 TIMES A DAY AS DIRECTED. 05/25/16   Chelle Jeffery, PA-C  TURMERIC PO Take 1 tablet by mouth daily.    Historical Provider, MD  XARELTO 20 MG TABS tablet TAKE 1 TABLET DAILY WITH SUPPER. 06/07/15   Robyn Haber, MD    Family  History Family History  Problem Relation Age of Onset  . Dementia Father   . Heart disease Father   . Clotting disorder Mother   . Arthritis Mother   . Clotting disorder Sister   . Clotting disorder Maternal Grandmother   . Clotting disorder Maternal Aunt   . Rheumatologic disease Neg Hx   . Hyperparathyroidism Neg Hx     Social History Social History  Substance Use Topics  . Smoking status: Former Smoker    Packs/day: 1.00    Years: 20.00    Quit date: 03/12/2004  . Smokeless tobacco: Never Used  . Alcohol use 0.6 oz/week    1 Cans of beer per week     Comment: 1-2 times a week     Allergies   Tramadol   Review of Systems Review of Systems  Constitutional: Positive for diaphoresis.  Cardiovascular: Positive for chest pain.  Genitourinary:       Perimenopausal   Allergic/Immunologic: Positive for immunocompromised state.       Diabetic  Neurological:       Suffers from chronic pain in multiple sites, etiology unclear  All other systems reviewed and are negative.    Physical Exam Updated Vital Signs BP (!) 126/96   Pulse (!) 103   Temp 98.5 F (36.9 C) (Oral)   Resp 16   SpO2 99%   Physical Exam  Constitutional: She appears well-developed and well-nourished.  HENT:  Head: Normocephalic and atraumatic.  Eyes: Conjunctivae are normal. Pupils are equal, round, and reactive to light.  Neck: Neck supple. No tracheal deviation present. No thyromegaly present.  Cardiovascular: Regular rhythm.   No murmur heard. Mildly tachycardic  Pulmonary/Chest: Effort normal and breath sounds normal.  Abdominal: Soft. Bowel sounds are normal. She exhibits no distension. There is no tenderness.  Obese  Musculoskeletal: Normal range of motion. She exhibits no edema or tenderness.  Neurological: She is alert. Coordination normal.  Skin: Skin is warm and dry. No rash noted.  Psychiatric: She has a normal mood and affect.  Nursing note and vitals reviewed.    ED  Treatments / Results  Labs (all labs ordered are listed, but only abnormal results are displayed) Labs Reviewed  [place], ED    EKG  EKG Interpretation  Date/Time:  Wednesday May 30 2016 14:08:19 EDT Ventricular Rate:  109 PR Interval:    QRS Duration: 78 QT Interval:  343 QTC Calculation: 462 R Axis:   46 Text Interpretation:  Sinus tachycardia Atrial premature complexes Borderline T abnormalities, inferior leads No significant change since last tracing Confirmed by Winfred Leeds  MD, Tyvion Edmondson [phone]) on 05/30/2016 2:20:21 PM      Results for orders placed or performed during the hospital encounter of 60/73/71  Basic metabolic panel  Result Value Ref Range  Sodium 139 135 - 145 mmol/L   Potassium 4.2 3.5 - 5.1 mmol/L   Chloride 103 101 - 111 mmol/L   CO2 26 22 - 32 mmol/L   Glucose, Bld 118 (H) 65 - 99 mg/dL   BUN 13 6 - 20 mg/dL   Creatinine, Ser 0.59 0.44 - 1.00 mg/dL   Calcium 9.7 8.9 - 10.3 mg/dL   GFR calc non Af Amer >60 >60 mL/min   GFR calc Af Amer >60 >60 mL/min   Anion gap 10 5 - 15  CBC  Result Value Ref Range   WBC 7.8 4.0 - 10.5 K/uL   RBC 5.04 3.87 - 5.11 MIL/uL   Hemoglobin 15.6 (H) 12.0 - 15.0 g/dL   HCT 45.7 36.0 - 46.0 %   MCV 90.7 78.0 - 100.0 fL   MCH 31.0 26.0 - 34.0 pg   MCHC 34.1 30.0 - 36.0 g/dL   RDW 13.1 11.5 - 15.5 %   Platelets 275 150 - 400 K/uL  Protime-INR (order if Patient is taking Coumadin / Warfarin)  Result Value Ref Range   Prothrombin Time 16.7 (H) 11.4 - 15.2 seconds   INR 1.34   I-stat troponin, ED  Result Value Ref Range   Troponin i, poc 0.00 0.00 - 0.08 ng/mL   Comment 3          I-stat troponin, ED  Result Value Ref Range   Troponin i, poc 0.00 0.00 - 0.08 ng/mL   Comment 3           Dg Chest 2 View  Result Date: 05/18/2016 CLINICAL DATA:  Chest pressure EXAM: CHEST  2 VIEW COMPARISON:  08/16/2015 FINDINGS: Heart and mediastinal contours are within normal limits. No  focal opacities or effusions. No acute bony abnormality. IMPRESSION: No active cardiopulmonary disease. Electronically Signed   By: Rolm Baptise M.D.   On: 05/18/2016 10:53   Ct Angio Chest Aorta W And/or Wo Contrast  Result Date: 05/30/2016 CLINICAL DATA:  Anterior chest pain radiating to back with shortness of breath, nausea and tightness of chest. EXAM: CT ANGIOGRAPHY CHEST WITH CONTRAST TECHNIQUE: Multidetector CT imaging of the chest was performed using the standard protocol during bolus administration of intravenous contrast. Multiplanar CT image reconstructions and MIPs were obtained to evaluate the vascular anatomy. CONTRAST:  100 mL Omnipaque 300 COMPARISON:  None. FINDINGS: Cardiovascular: Satisfactory opacification of the pulmonary arteries to the segmental level. No evidence of pulmonary embolism. Normal heart size. No pericardial effusion. Normal caliber thoracic aorta. No thoracic aortic dissection. Mediastinum/Nodes: No enlarged mediastinal, hilar, or axillary lymph nodes. Thyroid gland, trachea, and esophagus demonstrate no significant findings. Lungs/Pleura: Lungs are clear. No pleural effusion or pneumothorax. Upper Abdomen: No acute abnormality. Musculoskeletal: No chest wall abnormality. No acute or significant osseous findings. Review of the MIP images confirms the above findings. IMPRESSION: 1. No evidence pulmonary embolus 2. No thoracic aortic aneurysm or dissection. Electronically Signed   By: Kathreen Devoid   On: 05/30/2016 15:38   [name] Rt Lower Extrem Ltd Soft Tissue Non Vascular  Result Date: 05/28/2016 CLINICAL DATA:  Chronic medial right thigh nodularity and tenderness. Initial encounter. EXAM: ULTRASOUND RIGHT LOWER EXTREMITY LIMITED TECHNIQUE: Ultrasound examination of the lower extremity soft tissues was performed in the area of clinical concern. COMPARISON:  None. FINDINGS: Muscles: Normal. Other Soft Tissue Structures: A mildly prominent 1.1 cm node is noted at the right medial  upper thigh. No suspicious solid or cystic lesions are noted at the location of  the patient's symptoms. There is no evidence of soft tissue edema. There is no evidence of abscess. IMPRESSION: 1. No acute abnormality seen at the site of the patient's symptoms. No evidence of abscess. No evidence of soft tissue edema. 2. Mildly prominent 1.1 cm node at the right medial upper thigh may remain within normal limits. Electronically Signed   By: Garald Balding M.D.   On: 05/28/2016 19:14   Radiology [name] Rt [place] Soft Tissue Non Vascular  Result Date: 05/28/2016 CLINICAL DATA:  Chronic medial right thigh nodularity and tenderness. Initial encounter. EXAM: ULTRASOUND RIGHT LOWER EXTREMITY LIMITED TECHNIQUE: Ultrasound examination of the lower extremity soft tissues was performed in the area of clinical concern. COMPARISON:  None. FINDINGS: Muscles: Normal. Other Soft Tissue Structures: A mildly prominent 1.1 cm node is noted at the right medial upper thigh. No suspicious solid or cystic lesions are noted at the location of the patient's symptoms. There is no evidence of soft tissue edema. There is no evidence of abscess. IMPRESSION: 1. No acute abnormality seen at the site of the patient's symptoms. No evidence of abscess. No evidence of soft tissue edema. 2. Mildly prominent 1.1 cm node at the right medial upper thigh may remain within normal limits. Electronically Signed   By: Garald Balding M.D.   On: 05/28/2016 19:14    Procedures Procedures (including critical care time)  Medications Ordered in ED Medications - No data to display   Initial Impression / Assessment and Plan / ED Course  I have reviewed the triage vital signs and the nursing notes.  Pertinent labs & imaging results that were available during my care of the patient were reviewed by me and considered in my medical decision making (see chart for details).     Heart score equals 3 Patient suffering for chest pain for many  weeks. Highly atypical story. She does suffer from chronic pain. Pulmonary embolism ruled out. Aortic dissection ruled out. She'll be referred to [facility] heart care as outpatient. Patient does suffer from mild tachycardia likely secondary to anxiety. She had normal thyroid studies November 2017 Final Clinical Impressions(s) / ED Diagnoses  Diagnosis atypical chest pain Final diagnoses:  None    New Prescriptions New Prescriptions   No medications on file     Orlie Dakin, MD 05/30/16 [place], MD 05/30/16 1800

## 2016-05-31 LAB — HEMOGLOBIN A1C
Est. average glucose Bld gHb Est-mCnc: 171 mg/dL
Hgb A1c MFr Bld: 7.6 % — ABNORMAL HIGH (ref 4.8–5.6)

## 2016-06-04 ENCOUNTER — Other Ambulatory Visit: Payer: Self-pay | Admitting: Physician Assistant

## 2016-06-04 DIAGNOSIS — R0609 Other forms of dyspnea: Secondary | ICD-10-CM

## 2016-06-04 DIAGNOSIS — R0600 Dyspnea, unspecified: Secondary | ICD-10-CM

## 2016-06-04 DIAGNOSIS — R0789 Other chest pain: Secondary | ICD-10-CM

## 2016-06-04 DIAGNOSIS — R000 Tachycardia, unspecified: Secondary | ICD-10-CM

## 2016-06-04 NOTE — Progress Notes (Unsigned)
Please refer to cardiologist.  I advised patient via mychart

## 2016-06-05 ENCOUNTER — Telehealth: Payer: Self-pay | Admitting: *Deleted

## 2016-06-05 LAB — CBC

## 2016-06-05 LAB — BASIC METABOLIC PANEL
BUN/Creatinine Ratio: 26 — ABNORMAL HIGH (ref 9–23)
BUN: 15 mg/dL (ref 6–24)
CO2: 23 mmol/L (ref 18–29)
Calcium: 10.1 mg/dL (ref 8.7–10.2)
Chloride: 100 mmol/L (ref 96–106)
Creatinine, Ser: 0.57 mg/dL (ref 0.57–1.00)
GFR calc Af Amer: 127 mL/min/{1.73_m2} (ref >59)
GFR calc non Af Amer: 110 mL/min/{1.73_m2} (ref >59)
Glucose: 136 mg/dL — ABNORMAL HIGH (ref 65–99)
Potassium: 4.6 mmol/L (ref 3.5–5.2)
Sodium: 141 mmol/L (ref 134–144)

## 2016-06-05 NOTE — Telephone Encounter (Signed)
Patient left a message asking us to send in a form of medical necessity to her insurance company [facility]([employer]) to obtain a prior authorization for a TENS unit.  I contacted insurance company and was directed to a form online.  I have downloaded the form. Will fill out and send

## 2016-06-06 NOTE — Telephone Encounter (Signed)
[facility], patient notified. Patient was educated that if the prior authorization is denied, she would still be able to buy a TENS unit by way of [employer] or nearly any retail outlet that sells over-the-counter durable medical devices.

## 2016-06-08 ENCOUNTER — Ambulatory Visit (INDEPENDENT_AMBULATORY_CARE_PROVIDER_SITE_OTHER): Payer: 59 | Admitting: Physician Assistant

## 2016-06-08 VITALS — BP 120/82 | HR 96 | Temp 98.5°F | Resp 18 | Ht 68.0 in | Wt 247.5 lb

## 2016-06-08 DIAGNOSIS — E1165 Type 2 diabetes mellitus with hyperglycemia: Secondary | ICD-10-CM | POA: Diagnosis not present

## 2016-06-08 LAB — POCT URINALYSIS DIP (MANUAL ENTRY)
Glucose, UA: 250 — AB
Ketones, POC UA: NEGATIVE
Nitrite, UA: NEGATIVE
Protein Ur, POC: 30 — AB
Spec Grav, UA: 1.03 (ref 1.030–1.035)
Urobilinogen, UA: 0.2 (ref ?–2.0)
pH, UA: 5.5 (ref 5.0–8.0)

## 2016-06-08 LAB — GLUCOSE, POCT (MANUAL RESULT ENTRY): POC Glucose: 192 mg/dl — AB (ref 70–99)

## 2016-06-08 MED ORDER — METFORMIN HCL 500 MG PO TABS: ORAL_TABLET | ORAL | 2 refills | Status: DC

## 2016-06-08 NOTE — Patient Instructions (Signed)
     IF you received an x-ray today, you will receive an invoice from [facility]. Please contact [facility] at [phone] with questions or concerns regarding your invoice.   IF you received labwork today, you will receive an invoice from [facility]. Please contact [facility] at [phone] with questions or concerns regarding your invoice.   Our billing staff will not be able to assist you with questions regarding bills from these companies.  You will be contacted with the lab results as soon as they are available. The fastest way to get your results is to activate your My Chart account. Instructions are located on the last page of this paperwork. If you have not heard from us regarding the results in 2 weeks, please contact this office.     

## 2016-06-08 NOTE — Progress Notes (Signed)
PRIMARY CARE AT [place], [place] [address] [phone]  Date:  06/08/2016   Name:  Deborah Soto   DOB:  1965-08-26   MRN:  [id number]  PCP:  Ivar Drape, PA    History of Present Illness:  Deborah Soto is a 51 y.o. female patient who presents to PCP with  Chief Complaint  Patient presents with  . Hyperglycemia    Noticing headaches, sweats, & increased heart rate (not controlled since being diagnosed a few months ago)     Patient is here with concern that her blood sugar is high.  She was dxd with diabetes 2 weeks ago.  Placed on metformin and has started taking this twice per day.  She is checking her blood sugar every morning with glucose around  [phone] She has reduced her carb intake.  Her morning breakfast consist of an egg and one toast.   Patient continues ot have pain every where. There is concern of elevated heart rate and blood pressure.  Today bp has improved and heart rate rechecked and under 100.  She notes that she has not taken the fetzima in the last few days as she was tapered off by psychiatrist.  Continues to use the lexapro and gabapentin.  She also notes that she started using the cpap today.  Patient Active Problem List   Diagnosis Date Noted  . Positive ANA (antinuclear antibody) 04/02/2016  . Hyperparathyroidism ([place]) 01/09/2016  . Snoring 09/20/2015  . Cervical disc disorder with radiculopathy of cervical region 05/12/2015  . Hepatic steatosis 04/18/2015  . Left sided chest pain   . Dyspnea   . Achilles tendonitis 04/08/2012  . 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 PTE (pulmonary thromboembolism) ([place]) 10/10/2011  . Preeclampsia 10/10/2011  . Diverticulitis 10/10/2011  . Bipolar affective disorder ([place]) 10/10/2011  . BMI 34.0-34.9,adult 10/10/2011  . AR (allergic rhinitis) 10/10/2011    Past Medical History:  Diagnosis Date  . Asthma   . Depression   . Embolism - blood clot January 1997 & January 2017   Also  had LLE DVT in 1997    Past Surgical History:  Procedure Laterality Date  . NO PAST SURGERIES      Social History  Substance Use Topics  . Smoking status: Former Smoker    Packs/day: 1.00    Years: 20.00    Quit date: 03/12/2004  . Smokeless tobacco: Never Used  . Alcohol use 0.6 oz/week    1 Cans of beer per week     Comment: 1-2 times a week    Family History  Problem Relation Age of Onset  . Dementia Father   . Heart disease Father   . Clotting disorder Mother   . Arthritis Mother   . Clotting disorder Sister   . Clotting disorder Maternal Grandmother   . Clotting disorder Maternal Aunt   . Rheumatologic disease Neg Hx   . Hyperparathyroidism Neg Hx     Allergies  Allergen Reactions  . Tramadol Other (See Comments)    Tingling all over     Medication list has been reviewed and updated.  Current Outpatient Prescriptions on File Prior to Visit  Medication Sig Dispense Refill  . blood glucose meter kit and supplies KIT Dispense based on patient and insurance preference. Use up to four times daily as directed. (FOR ICD-9 250.00, 250.01). 1 each 0  . busPIRone (BUSPAR) 30 MG tablet Take 30 mg by mouth 2 (two) times daily.    Marland Kitchen  Cholecalciferol (VITAMIN D3) 5000 units CAPS Take 10,000 Units by mouth daily.     . Clindamycin-Benzoyl Per, Refr, gel Apply 1 application topically daily.    Marland Kitchen escitalopram (LEXAPRO) 10 MG tablet Take 10 mg by mouth at bedtime.     . gabapentin (NEURONTIN) 600 MG tablet Take 2 tablets (1,200 mg total) by mouth 3 (three) times daily. 180 tablet 1  . lidocaine (LIDODERM) 5 % Place 1 patch onto the skin daily. Remove & Discard patch within 12 hours or as directed by MD 30 patch 0  . MAGNESIUM PO Take 1 tablet by mouth daily.    . metFORMIN (GLUCOPHAGE) 500 MG tablet Take 1 tablet (500 mg total) by mouth 2 (two) times daily with a meal. Initial 1 tablet for the first week. 60 tablet 1  . methocarbamol (ROBAXIN-750) 750 MG tablet Take 1 tablet (750  mg total) by mouth every 8 (eight) hours as needed for muscle spasms. 90 tablet 1  . ONETOUCH VERIO test strip CHECK BLOOD SUGAR UP TO 4 TIMES A DAY AS DIRECTED. 100 each 3  . XARELTO 20 MG TABS tablet TAKE 1 TABLET DAILY WITH SUPPER. 90 tablet 1  . Cyanocobalamin (VITAMIN B-12 CR PO) Take 2 tablets by mouth daily.     . Levomilnacipran HCl ER (FETZIMA) 40 MG CP24 Take 40 mg by mouth daily with breakfast.     . Multiple Vitamin (MULTIVITAMIN WITH MINERALS) TABS tablet Take 1 tablet by mouth daily.    . TURMERIC PO Take 1 tablet by mouth daily.     No current facility-administered medications on file prior to visit.     ROS ROS otherwise unremarkable unless listed above.  Physical Examination: BP 120/82   Pulse (!) 109   Temp 98.5 F (36.9 C) (Oral)   Resp 18   Ht '5\' 8"'$  (1.727 m)   Wt 247 lb 8 oz (112.3 kg)   SpO2 95%   BMI 37.63 kg/m  Ideal Body Weight: Weight in (lb) to have BMI = 25: 164.1  Physical Exam  Constitutional: She is oriented to person, place, and time. She appears well-developed and well-nourished. No distress.  HENT:  Head: Normocephalic and atraumatic.  Right Ear: External ear normal.  Left Ear: External ear normal.  Eyes: Conjunctivae and EOM are normal. Pupils are equal, round, and reactive to light.  Cardiovascular: Regular rhythm and normal heart sounds.  Exam reveals no gallop and no friction rub.   No murmur heard. Pulmonary/Chest: Effort normal. No respiratory distress.  Neurological: She is alert and oriented to person, place, and time.  Skin: She is not diaphoretic.  Psychiatric: She has a normal mood and affect. Her behavior is normal.     Assessment and Plan: Deborah Soto is a 51 y.o. female who is here today for cc of elevated glucose.   She will return in 4 weeks for recheck.  I will add one more pill for two 500 in the morning, and '500mg'$  at evening meal.  Continue to check glucose.  Cardiology appt, within the next 2 weeks.  Recheck a1c  in 3 months. Type 2 diabetes mellitus with hyperglycemia, without long-term current use of insulin ([place]) - Plan: POCT glucose (manual entry), POCT urinalysis dipstick, CMP14+EGFR, Urine culture, DISCONTINUED: metFORMIN (GLUCOPHAGE) 500 MG tablet  Ivar Drape, PA-C [facility] 3/31/201811:30 PM

## 2016-06-09 LAB — CMP14+EGFR
ALT: 45 IU/L — ABNORMAL HIGH (ref 0–32)
AST: 36 IU/L (ref 0–40)
Albumin/Globulin Ratio: 2.4 — ABNORMAL HIGH (ref 1.2–2.2)
Albumin: 4.6 g/dL (ref 3.5–5.5)
Alkaline Phosphatase: 56 IU/L (ref 39–117)
BUN/Creatinine Ratio: 27 — ABNORMAL HIGH (ref 9–23)
BUN: 16 mg/dL (ref 6–24)
Bilirubin Total: 0.6 mg/dL (ref 0.0–1.2)
CO2: 21 mmol/L (ref 18–29)
Calcium: 9.9 mg/dL (ref 8.7–10.2)
Chloride: 101 mmol/L (ref 96–106)
Creatinine, Ser: 0.6 mg/dL (ref 0.57–1.00)
GFR calc Af Amer: 125 mL/min/{1.73_m2} (ref >59)
GFR calc non Af Amer: 108 mL/min/{1.73_m2} (ref >59)
Globulin, Total: 1.9 g/dL (ref 1.5–4.5)
Glucose: 163 mg/dL — ABNORMAL HIGH (ref 65–99)
Potassium: 4.2 mmol/L (ref 3.5–5.2)
Sodium: 141 mmol/L (ref 134–144)
Total Protein: 6.5 g/dL (ref 6.0–8.5)

## 2016-06-09 MED ORDER — METFORMIN HCL 500 MG PO TABS: 500.0000 mg | ORAL_TABLET | Freq: Two times a day (BID) | ORAL | 1 refills | Status: DC

## 2016-06-10 LAB — URINE CULTURE: Organism ID, Bacteria: NO GROWTH

## 2016-06-11 ENCOUNTER — Inpatient Hospital Stay (HOSPITAL_COMMUNITY)
Admission: EM | Admit: 2016-06-11 | Discharge: 2016-06-14 | DRG: 917 | Disposition: A | Discharge status: Other Acute Inpatient Hospital | Payer: 59 | Attending: Internal Medicine | Admitting: Internal Medicine

## 2016-06-11 ENCOUNTER — Emergency Department (HOSPITAL_COMMUNITY): Payer: 59

## 2016-06-11 ENCOUNTER — Encounter (HOSPITAL_COMMUNITY): Payer: Self-pay

## 2016-06-11 DIAGNOSIS — Z86711 Personal history of pulmonary embolism: Secondary | ICD-10-CM

## 2016-06-11 DIAGNOSIS — E860 Dehydration: Secondary | ICD-10-CM | POA: Diagnosis not present

## 2016-06-11 DIAGNOSIS — Z7901 Long term (current) use of anticoagulants: Secondary | ICD-10-CM

## 2016-06-11 DIAGNOSIS — Z87891 Personal history of nicotine dependence: Secondary | ICD-10-CM

## 2016-06-11 DIAGNOSIS — Z8249 Family history of ischemic heart disease and other diseases of the circulatory system: Secondary | ICD-10-CM

## 2016-06-11 DIAGNOSIS — E119 Type 2 diabetes mellitus without complications: Secondary | ICD-10-CM | POA: Diagnosis present

## 2016-06-11 DIAGNOSIS — T1491XA Suicide attempt, initial encounter: Secondary | ICD-10-CM

## 2016-06-11 DIAGNOSIS — F39 Unspecified mood [affective] disorder: Secondary | ICD-10-CM | POA: Diagnosis present

## 2016-06-11 DIAGNOSIS — Z8261 Family history of arthritis: Secondary | ICD-10-CM

## 2016-06-11 DIAGNOSIS — F319 Bipolar disorder, unspecified: Secondary | ICD-10-CM | POA: Diagnosis present

## 2016-06-11 DIAGNOSIS — G9340 Encephalopathy, unspecified: Secondary | ICD-10-CM

## 2016-06-11 DIAGNOSIS — T50902A Poisoning by unspecified drugs, medicaments and biological substances, intentional self-harm, initial encounter: Secondary | ICD-10-CM | POA: Diagnosis not present

## 2016-06-11 DIAGNOSIS — R5383 Other fatigue: Secondary | ICD-10-CM

## 2016-06-11 DIAGNOSIS — E669 Obesity, unspecified: Secondary | ICD-10-CM | POA: Diagnosis present

## 2016-06-11 DIAGNOSIS — G92 Toxic encephalopathy: Secondary | ICD-10-CM | POA: Diagnosis present

## 2016-06-11 DIAGNOSIS — Z6837 Body mass index (BMI) 37.0-37.9, adult: Secondary | ICD-10-CM

## 2016-06-11 DIAGNOSIS — R45851 Suicidal ideations: Secondary | ICD-10-CM | POA: Diagnosis not present

## 2016-06-11 DIAGNOSIS — Z832 Family history of diseases of the blood and blood-forming organs and certain disorders involving the immune mechanism: Secondary | ICD-10-CM

## 2016-06-11 DIAGNOSIS — Z7984 Long term (current) use of oral hypoglycemic drugs: Secondary | ICD-10-CM

## 2016-06-11 DIAGNOSIS — G4730 Sleep apnea, unspecified: Secondary | ICD-10-CM | POA: Diagnosis present

## 2016-06-11 DIAGNOSIS — E1169 Type 2 diabetes mellitus with other specified complication: Secondary | ICD-10-CM

## 2016-06-11 DIAGNOSIS — Z86718 Personal history of other venous thrombosis and embolism: Secondary | ICD-10-CM

## 2016-06-11 DIAGNOSIS — I2699 Other pulmonary embolism without acute cor pulmonale: Secondary | ICD-10-CM | POA: Diagnosis present

## 2016-06-11 HISTORY — DX: Type 2 diabetes mellitus with other specified complication: E11.69

## 2016-06-11 HISTORY — DX: Type 2 diabetes mellitus with other specified complication: E66.9

## 2016-06-11 HISTORY — DX: Obesity, unspecified: E66.9

## 2016-06-11 LAB — COMPREHENSIVE METABOLIC PANEL
ALT: 50 U/L (ref 14–54)
AST: 46 U/L — ABNORMAL HIGH (ref 15–41)
Albumin: 4 g/dL (ref 3.5–5.0)
Alkaline Phosphatase: 44 U/L (ref 38–126)
Anion gap: 8 (ref 5–15)
BUN: 21 mg/dL — ABNORMAL HIGH (ref 6–20)
CO2: 24 mmol/L (ref 22–32)
Calcium: 8.9 mg/dL (ref 8.9–10.3)
Chloride: 109 mmol/L (ref 101–111)
Creatinine, Ser: 1.12 mg/dL — ABNORMAL HIGH (ref 0.44–1.00)
GFR calc Af Amer: >60 mL/min (ref >60)
GFR calc non Af Amer: 57 mL/min — ABNORMAL LOW (ref >60)
Glucose, Bld: 167 mg/dL — ABNORMAL HIGH (ref 65–99)
Potassium: 4.4 mmol/L (ref 3.5–5.1)
Sodium: 141 mmol/L (ref 135–145)
Total Bilirubin: 1 mg/dL (ref 0.3–1.2)
Total Protein: 6.3 g/dL — ABNORMAL LOW (ref 6.5–8.1)

## 2016-06-11 LAB — BLOOD GAS, VENOUS
Acid-base deficit: 9.5 mmol/L — ABNORMAL HIGH (ref 0.0–2.0)
Bicarbonate: 14.2 mmol/L — ABNORMAL LOW (ref 20.0–28.0)
O2 Saturation: 88.9 %
Patient temperature: 98.6
pCO2, Ven: 24.7 mmHg — ABNORMAL LOW (ref 44.0–60.0)
pH, Ven: 7.379 (ref 7.250–7.430)
pO2, Ven: 60.7 mmHg — ABNORMAL HIGH (ref 32.0–45.0)

## 2016-06-11 LAB — RAPID URINE DRUG SCREEN, HOSP PERFORMED
Amphetamines: NOT DETECTED
Barbiturates: NOT DETECTED
Benzodiazepines: POSITIVE — AB
Cocaine: NOT DETECTED
Opiates: NOT DETECTED
Tetrahydrocannabinol: NOT DETECTED

## 2016-06-11 LAB — ETHANOL: Alcohol, Ethyl (B): <5 mg/dL (ref <5)

## 2016-06-11 LAB — URINALYSIS, ROUTINE W REFLEX MICROSCOPIC
Glucose, UA: 50 mg/dL — AB
Ketones, ur: 20 mg/dL — AB
Leukocytes, UA: NEGATIVE
Nitrite: NEGATIVE
Protein, ur: 30 mg/dL — AB
Specific Gravity, Urine: 1.023 (ref 1.005–1.030)
pH: 5 (ref 5.0–8.0)

## 2016-06-11 LAB — BLOOD GAS, ARTERIAL
Acid-base deficit: 1.2 mmol/L (ref 0.0–2.0)
Bicarbonate: 22.2 mmol/L (ref 20.0–28.0)
Drawn by: 235321
FIO2: 21
O2 Saturation: 94.9 %
Patient temperature: 98.6
pCO2 arterial: 34.7 mmHg (ref 32.0–48.0)
pH, Arterial: 7.422 (ref 7.350–7.450)
pO2, Arterial: 76.6 mmHg — ABNORMAL LOW (ref 83.0–108.0)

## 2016-06-11 LAB — CBC
HCT: 41 % (ref 36.0–46.0)
Hemoglobin: 14 g/dL (ref 12.0–15.0)
MCH: 30 pg (ref 26.0–34.0)
MCHC: 34.1 g/dL (ref 30.0–36.0)
MCV: 88 fL (ref 78.0–100.0)
Platelets: 266 10*3/uL (ref 150–400)
RBC: 4.66 MIL/uL (ref 3.87–5.11)
RDW: 12.7 % (ref 11.5–15.5)
WBC: 9 10*3/uL (ref 4.0–10.5)

## 2016-06-11 LAB — SALICYLATE LEVEL: Salicylate Lvl: <7 mg/dL (ref 2.8–30.0)

## 2016-06-11 LAB — CBG MONITORING, ED
Glucose-Capillary: 158 mg/dL — ABNORMAL HIGH (ref 65–99)
Glucose-Capillary: 129 mg/dL — ABNORMAL HIGH (ref 65–99)
Glucose-Capillary: 126 mg/dL — ABNORMAL HIGH (ref 65–99)

## 2016-06-11 LAB — TSH: TSH: 0.676 u[IU]/mL (ref 0.350–4.500)

## 2016-06-11 LAB — PROTIME-INR
INR: 1.52
Prothrombin Time: 18.4 seconds — ABNORMAL HIGH (ref 11.4–15.2)

## 2016-06-11 LAB — I-STAT BETA HCG BLOOD, ED (MC, WL, AP ONLY): I-stat hCG, quantitative: 6.6 m[IU]/mL — ABNORMAL HIGH (ref <5)

## 2016-06-11 LAB — I-STAT CG4 LACTIC ACID, ED: Lactic Acid, Venous: 0.98 mmol/L (ref 0.5–1.9)

## 2016-06-11 LAB — MAGNESIUM: Magnesium: 0.8 mg/dL — CL (ref 1.7–2.4)

## 2016-06-11 LAB — AMMONIA: Ammonia: 25 umol/L (ref 9–35)

## 2016-06-11 LAB — APTT: aPTT: 28 seconds (ref 24–36)

## 2016-06-11 LAB — ACETAMINOPHEN LEVEL: Acetaminophen (Tylenol), Serum: <10 ug/mL — ABNORMAL LOW (ref 10–30)

## 2016-06-11 LAB — POC URINE PREG, ED: Preg Test, Ur: NEGATIVE

## 2016-06-11 MED ORDER — SODIUM CHLORIDE 0.9 % IV SOLN
INTRAVENOUS | Status: AC
  Administered 2016-06-12 (×3): via INTRAVENOUS

## 2016-06-11 MED ORDER — INSULIN ASPART 100 UNIT/ML ~~LOC~~ SOLN
0.0000 [IU] | SUBCUTANEOUS | Status: DC
  Administered 2016-06-11: 1 [IU] via SUBCUTANEOUS
  Filled 2016-06-11: qty 1

## 2016-06-11 MED ORDER — NALOXONE HCL 0.4 MG/ML IJ SOLN
0.4000 mg | Freq: Once | INTRAMUSCULAR | Status: AC
Start: 2016-06-11 — End: 2016-06-11
  Administered 2016-06-11: 0.4 mg via INTRAVENOUS
  Filled 2016-06-11: qty 1

## 2016-06-11 MED ORDER — SODIUM CHLORIDE 0.9 % IV BOLUS (SEPSIS)
1000.0000 mL | Freq: Once | INTRAVENOUS | Status: AC
  Administered 2016-06-11: 1000 mL via INTRAVENOUS

## 2016-06-11 MED ORDER — ACETAMINOPHEN 325 MG PO TABS
650.0000 mg | ORAL_TABLET | Freq: Four times a day (QID) | ORAL | Status: DC | PRN
  Administered 2016-06-12 – 2016-06-13 (×4): 650 mg via ORAL
  Filled 2016-06-11 (×4): qty 2

## 2016-06-11 MED ORDER — ACETAMINOPHEN 650 MG RE SUPP: 650.0000 mg | Freq: Four times a day (QID) | RECTAL | Status: DC | PRN

## 2016-06-11 MED ORDER — NALOXONE HCL 0.4 MG/ML IJ SOLN
0.4000 mg | Freq: Once | INTRAMUSCULAR | Status: AC
  Administered 2016-06-11: 0.4 mg via INTRAVENOUS
  Filled 2016-06-11: qty 1

## 2016-06-11 MED ORDER — LACTATED RINGERS IV BOLUS (SEPSIS)
1000.0000 mL | Freq: Once | INTRAVENOUS | Status: AC
  Administered 2016-06-11: 1000 mL via INTRAVENOUS

## 2016-06-11 NOTE — ED Notes (Signed)
Patient transported to CT 

## 2016-06-11 NOTE — ED Triage Notes (Signed)
Per EMS, Pt, from home, presents after overdosing on an unknown substance.  Pt will only respond to painful stimuli.  Per [employer], Pt's husband received a text stating she "doesn't want to be a burden anymore."  Also,sts she hasn't eaten in several days.  Hx of depression.

## 2016-06-11 NOTE — H&P (Addendum)
History and Physical    Deborah Soto GGY:[id number] DOB: 06-01-1965 DOA: 06/11/2016  PCP: Ivar Drape, PA  Patient coming from: Home.  History obtained from patient's husband. Patient is encephalopathic.  Chief Complaint: Drowsiness.  HPI: Deborah Soto is a 51 y.o. female with history of recently diagnosed diabetes mellitus type 2, sleep apnea, history of DVT and PE on xarelto and history of depression was brought to the ER after patient was found to be increasingly lethargic and drowsy by the husband. As per the husband patient has been depressed last 2 days and has been threatening suicide. Patient had taken her home medications yesterday. This morning when husband was planning to give the medications to her she refused. Since patient was looking more lethargic husband plan to work at home. In the afternoon patient was found walking in the kitchen and later on was found to be on the bed lethargic. Later in the evening around 4-5 PM husband called EMS and patient was brought to the ER.   ED Course: CT of the head was unremarkable. Initial ABG and later ABG did not show any carbon dioxide retention. Drug screen was positive for benzodiazepine. As per the husband patient had taken 1 dose of Seroquel which may be new for patient. Otherwise all her medications were accounted for. On my exam patient is minimally responsive. Pupils are reacting.  Review of Systems: As per HPI, rest all negative.   Past Medical History:  Diagnosis Date  . Asthma   . Depression   . Diabetes mellitus type 2 in obese ([facility]) 06/11/2016  . Embolism - blood clot January 1997 & January 2017   Also had LLE DVT in 1997    Past Surgical History:  Procedure Laterality Date  . NO PAST SURGERIES       reports that she quit smoking about 12 years ago. She has a 20.00 pack-year smoking history. She has never used smokeless tobacco. She reports that she drinks about 0.6 oz of alcohol per week . She reports that  she does not use drugs.  Allergies  Allergen Reactions  . Tramadol Other (See Comments)    Tingling all over     Family History  Problem Relation Age of Onset  . Dementia Father   . Heart disease Father   . Clotting disorder Mother   . Arthritis Mother   . Clotting disorder Sister   . Clotting disorder Maternal Grandmother   . Clotting disorder Maternal Aunt   . Rheumatologic disease Neg Hx   . Hyperparathyroidism Neg Hx     Prior to Admission medications   Medication Sig Start Date End Date Taking? Authorizing Provider  busPIRone (BUSPAR) 30 MG tablet Take 30 mg by mouth 2 (two) times daily.   Yes Historical Provider, MD  Cholecalciferol (VITAMIN D3) 5000 units CAPS Take 10,000 Units by mouth daily.    Yes Historical Provider, MD  Cyanocobalamin (VITAMIN B-12 CR PO) Take 2 tablets by mouth daily.    Yes Historical Provider, MD  escitalopram (LEXAPRO) 10 MG tablet Take 10 mg by mouth at bedtime.    Yes Historical Provider, MD  gabapentin (NEURONTIN) 600 MG tablet Take 2 tablets (1,200 mg total) by mouth 3 (three) times daily. 05/24/16 06/23/16 Yes Ankit Lorie Phenix, MD  lidocaine (LIDODERM) 5 % Place 1 patch onto the skin daily. Remove & Discard patch within 12 hours or as directed by MD 05/24/16  Yes Ankit Lorie Phenix, MD  MAGNESIUM PO Take 1 tablet by  mouth daily.   Yes Historical Provider, MD  metFORMIN (GLUCOPHAGE) 500 MG tablet Take 1 tablet (500 mg total) by mouth 2 (two) times daily with a meal. Initial 1 tablet for the first week. Patient taking differently: Take 500-1,000 mg by mouth 2 (two) times daily with a meal. Take 1 tablet in the am and Take 2 tablets in the evening. 06/09/16  Yes Dorian Heckle English, PA  methocarbamol (ROBAXIN-750) 750 MG tablet Take 1 tablet (750 mg total) by mouth every 8 (eight) hours as needed for muscle spasms. 05/24/16  Yes Ankit Lorie Phenix, MD  XARELTO 20 MG TABS tablet TAKE 1 TABLET DAILY WITH SUPPER. 06/07/15  Yes Robyn Haber, MD  blood glucose  meter kit and supplies KIT Dispense based on patient and insurance preference. Use up to four times daily as directed. (FOR ICD-9 250.00, 250.01). 05/23/16   Dorian Heckle English, PA  ONETOUCH VERIO test strip CHECK BLOOD SUGAR UP TO 4 TIMES A DAY AS DIRECTED. 05/25/16   Harrison Mons, PA-C    Physical Exam: Vitals:   06/11/16 2030 06/11/16 2107 06/11/16 2149 06/11/16 2238  BP: 1'00/69 94/66 98/71 '$ 98/65  Pulse: 90 86 85 94  Resp: 18 18 (!) 21 15  Temp:      TempSrc:      SpO2: 96% 95% 97% 96%  Weight:      Height:          Constitutional: Moderately built and nourished. Vitals:   06/11/16 2030 06/11/16 2107 06/11/16 2149 06/11/16 2238  BP: 1'00/69 94/66 98/71 '$ 98/65  Pulse: 90 86 85 94  Resp: 18 18 (!) 21 15  Temp:      TempSrc:      SpO2: 96% 95% 97% 96%  Weight:      Height:       Eyes: Anicteric no pallor. ENMT: No discharge from the ears eyes nose or mouth. Neck: No mass felt. No neck rigidity. Respiratory: No rhonchi or crepitations. Cardiovascular: S1 and S2 heard no murmurs appreciated. Abdomen: Soft nontender bowel sounds present. Musculoskeletal: No edema. No joint effusion. Skin: No rash. Skin appears warm. Neurologic: Patient is lethargic pupils reacting no further neurological exam possible. Psychiatric: Patient lethargic.   Labs on Admission: I have personally reviewed following labs and imaging studies  CBC:  Recent Labs Lab 06/11/16 1822  WBC 9.0  HGB 14.0  HCT 41.0  MCV 88.0  PLT 242   Basic Metabolic Panel:  Recent Labs Lab 06/08/16 0940 06/11/16 1822  NA 141 141  K 4.2 4.4  CL 101 109  CO2 21 24  GLUCOSE 163* 167*  BUN 16 21*  CREATININE 0.60 1.12*  CALCIUM 9.9 8.9   GFR: Estimated Creatinine Clearance: 80.6 mL/min (A) (by C-G formula based on SCr of 1.12 mg/dL (H)). Liver Function Tests:  Recent Labs Lab 06/08/16 0940 06/11/16 1822  AST 36 46*  ALT 45* 50  ALKPHOS 56 44  BILITOT 0.6 1.0  PROT 6.5 6.3*  ALBUMIN 4.6 4.0    No results for input(s): LIPASE, AMYLASE in the last 168 hours. No results for input(s): AMMONIA in the last 168 hours. Coagulation Profile:  Recent Labs Lab 06/11/16 1850  INR 1.52   Cardiac Enzymes: No results for input(s): CKTOTAL, CKMB, CKMBINDEX, TROPONINI in the last 168 hours. BNP (last 3 results) No results for input(s): PROBNP in the last 8760 hours. HbA1C: No results for input(s): HGBA1C in the last 72 hours. CBG:  Recent Labs Lab 06/11/16 1811 06/11/16  1953  GLUCAP 158* 129*   Lipid Profile: No results for input(s): CHOL, HDL, LDLCALC, TRIG, CHOLHDL, LDLDIRECT in the last 72 hours. Thyroid Function Tests:  Recent Labs  06/11/16 1919  TSH 0.676   Anemia Panel: No results for input(s): VITAMINB12, FOLATE, FERRITIN, TIBC, IRON, RETICCTPCT in the last 72 hours. Urine analysis:    Component Value Date/Time   COLORURINE AMBER (A) 06/11/2016 1919   APPEARANCEUR HAZY (A) 06/11/2016 1919   LABSPEC 1.023 06/11/2016 1919   PHURINE 5.0 06/11/2016 1919   GLUCOSEU 50 (A) 06/11/2016 1919   HGBUR MODERATE (A) 06/11/2016 1919   BILIRUBINUR SMALL (A) 06/11/2016 1919   BILIRUBINUR small (A) 06/08/2016 0922   BILIRUBINUR neg 07/29/2014 1536   KETONESUR 20 (A) 06/11/2016 1919   PROTEINUR 30 (A) 06/11/2016 1919   UROBILINOGEN 0.2 06/08/2016 0922   NITRITE NEGATIVE 06/11/2016 1919   LEUKOCYTESUR NEGATIVE 06/11/2016 1919   Sepsis Labs: '@LABRCNTIP'$ (procalcitonin:4,lacticidven:4) ) Recent Results (from the past 240 hour(s))  Urine culture     Status: None   Collection Time: 06/08/16  9:55 AM  Result Value Ref Range Status   Urine Culture, Routine Final report  Final   Urine Culture result 1 No growth  Final     Radiological Exams on Admission: Dg Chest 2 View  Result Date: 06/11/2016 CLINICAL DATA:  Overdose on unknown substance. EXAM: CHEST  2 VIEW COMPARISON:  March 9th 2018 FINDINGS: The heart size and mediastinal contours are within normal limits. Both lungs  are clear. The visualized skeletal structures are unremarkable. IMPRESSION: No active cardiopulmonary disease. Electronically Signed   By: Abelardo Diesel M.D.   On: 06/11/2016 20:41   Ct Head Wo Contrast  Result Date: 06/11/2016 CLINICAL DATA:  Overdose on unknown substance. EXAM: CT HEAD WITHOUT CONTRAST TECHNIQUE: Contiguous axial images were obtained from the base of the skull through the vertex without intravenous contrast. COMPARISON:  June 14, 2007 FINDINGS: Brain: No evidence of acute infarction, hemorrhage, hydrocephalus, extra-axial collection or mass lesion/mass effect. Vascular: No hyperdense vessel or unexpected calcification. Skull: Normal. Negative for fracture or focal lesion. Sinuses/Orbits: No acute finding. Other: None. IMPRESSION: No focal acute intracranial abnormality identified. Electronically Signed   By: Abelardo Diesel M.D.   On: 06/11/2016 19:41    EKG: Independently reviewed. Normal sinus rhythm.  Assessment/Plan  1. Acute encephalopathy - likely from drug related. Check ammonia levels and closely observe and stepdown unit. Not sure which drug patient could have taken but as per the husband patient had taken one tablet of Seroquel which may be new for the patient. For now I'm holding off patient's antidepressants and gabapentin. Closely observe and stepdown. 2. Suicidal ideation - patient is placed on suicide precautions. Consult psychiatrist in a.m. 3. History of PE and DVT - last dose of xarelto taken was last evening. Keep patient on heparin infusion until patient is alert awake to take xarelto. 4. Recently diagnosed diabetes mellitus type 2 - will keep patient on sliding scale coverage. 5. Recently diagnosed sleep apnea - ABG does not show any common dioxide retention. Will keep patient on CPAP.   DVT prophylaxis: Lovenox. Code Status: Full code.  Family Communication: Patient's husband.  Disposition Plan: To be determined.  Consults called: None.  Admission status:  Observation.    Rise Patience MD [facility] Pager [phone].  If 7PM-7AM, please contact night-coverage [email or web address] Password [facility]  06/11/2016, 11:04 PM

## 2016-06-11 NOTE — Progress Notes (Signed)
ANTICOAGULATION CONSULT NOTE - Initial Consult  Pharmacy Consult for IV Heparin Indication: pulmonary embolus  Allergies  Allergen Reactions  . Tramadol Other (See Comments)    Tingling all over     Patient Measurements: Height:  (172.7 cm) Weight: 247 lb (112 kg) IBW/kg (Calculated) : 63.9 Heparin Dosing Weight: 78 kg  Vital Signs: Temp: 98.9 F (37.2 C) (04/02 1814) Temp Source: Axillary (04/02 1814) BP: 92/63 (04/02 2335) Pulse Rate: 80 (04/02 2335)  Labs:  Recent Labs  06/11/16 1822 06/11/16 1850  HGB 14.0  --   HCT 41.0  --   PLT 266  --   LABPROT  --  18.4*  INR  --  1.52  CREATININE 1.12*  --     Estimated Creatinine Clearance: 80.6 mL/min (A) (by C-G formula based on SCr of 1.12 mg/dL (H)).   Medical History: Past Medical History:  Diagnosis Date  . Asthma   . Depression   . Diabetes mellitus type 2 in obese ([facility]) 06/11/2016  . Embolism - blood clot January 1997 & January 2017   Also had LLE DVT in 1997    Medications:  Scheduled:  . insulin aspart  0-9 Units Subcutaneous Q4H   Infusions:  . sodium chloride      Assessment: 50 yoF s/p drug OD of unknown medications with altered mental status on chronic xarelto for hx of DVT/PE.  IV heparin per Rx.  LD xarelto 4/1 at 2030 4/2 baseline coags: -INR=1.52 -aptt=28 -HL=1.02 Will use aptt until they correlate with HL d/t recent xarelto use Goal of Therapy:  aPtt= 66-102 Heparin level 0.3-0.7 units/ml Monitor platelets by anticoagulation protocol: Yes   Plan:  Baseline aptt and HL STAT Heparin 3000 unit bolus x1 then heparin drip at 1400 units/hr Daily CBC and HL Will check aptt until HL and aptt correlate (d/t recent xarelto use)  Deborah Soto 06/11/2016,11:36 PM

## 2016-06-11 NOTE — ED Notes (Signed)
Bed: ZO10 Expected date:  Expected time:  Means of arrival: Ambulance Comments: EMS Overdose

## 2016-06-11 NOTE — ED Provider Notes (Signed)
[facility] DEPT Provider Note   CSN: [id number] Arrival date & time: 06/11/16  1801     History   Chief Complaint Chief Complaint  Patient presents with  . Drug Overdose  . Altered Mental Status    HPI Deborah Soto is a 51 y.o. female.  HPI 51 year old female with past medical history as below who presents with altered mental status. Patient reportedly has been increasingly depressed over the last week after an argument with her mother-in-law. She has not been eating or drinking for the last 2 days. Earlier today, she reported worsening depression to her husband and was unwilling to talk to him. They have been fighting. According to the husband, he found her significant more confused and she has been in her house. She had taken one dose of Seroquel but he does not believe she took anything else. He suddenly called EMS and she was brought to the ER. Currently, patient is altered and unable to provide further history. She does have a history of previous hospitalizations and suicide attempt.   Past Medical History:  Diagnosis Date  . Asthma   . Depression   . Embolism - blood clot January 1997 & January 2017   Also had LLE DVT in 1997    Patient Active Problem List   Diagnosis Date Noted  . Positive ANA (antinuclear antibody) 04/02/2016  . Hyperparathyroidism ([place]) 01/09/2016  . Snoring 09/20/2015  . Cervical disc disorder with radiculopathy of cervical region 05/12/2015  . Hepatic steatosis 04/18/2015  . Left sided chest pain   . Dyspnea   . Achilles tendonitis 04/08/2012  . 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 PTE (pulmonary thromboembolism) ([place]) 10/10/2011  . Preeclampsia 10/10/2011  . Diverticulitis 10/10/2011  . Bipolar affective disorder ([place]) 10/10/2011  . BMI 34.0-34.9,adult 10/10/2011  . AR (allergic rhinitis) 10/10/2011    Past Surgical History:  Procedure Laterality Date  . NO PAST SURGERIES      OB History    No data available       Home  Medications    Prior to Admission medications   Medication Sig Start Date End Date Taking? Authorizing Provider  busPIRone (BUSPAR) 30 MG tablet Take 30 mg by mouth 2 (two) times daily.   Yes Historical Provider, MD  Cholecalciferol (VITAMIN D3) 5000 units CAPS Take 10,000 Units by mouth daily.    Yes Historical Provider, MD  Cyanocobalamin (VITAMIN B-12 CR PO) Take 2 tablets by mouth daily.    Yes Historical Provider, MD  escitalopram (LEXAPRO) 10 MG tablet Take 10 mg by mouth at bedtime.    Yes Historical Provider, MD  gabapentin (NEURONTIN) 600 MG tablet Take 2 tablets (1,200 mg total) by mouth 3 (three) times daily. 05/24/16 06/23/16 Yes Ankit Lorie Phenix, MD  lidocaine (LIDODERM) 5 % Place 1 patch onto the skin daily. Remove & Discard patch within 12 hours or as directed by MD 05/24/16  Yes Ankit Lorie Phenix, MD  MAGNESIUM PO Take 1 tablet by mouth daily.   Yes Historical Provider, MD  metFORMIN (GLUCOPHAGE) 500 MG tablet Take 1 tablet (500 mg total) by mouth 2 (two) times daily with a meal. Initial 1 tablet for the first week. Patient taking differently: Take 500-1,000 mg by mouth 2 (two) times daily with a meal. Take 1 tablet in the am and Take 2 tablets in the evening. 06/09/16  Yes Dorian Heckle English, PA  methocarbamol (ROBAXIN-750) 750 MG tablet Take 1 tablet (750 mg total) by mouth every 8 (eight) hours as  needed for muscle spasms. 05/24/16  Yes Ankit Lorie Phenix, MD  XARELTO 20 MG TABS tablet TAKE 1 TABLET DAILY WITH SUPPER. 06/07/15  Yes Robyn Haber, MD  blood glucose meter kit and supplies KIT Dispense based on patient and insurance preference. Use up to four times daily as directed. (FOR ICD-9 250.00, 250.01). 05/23/16   Dorian Heckle English, PA  ONETOUCH VERIO test strip CHECK BLOOD SUGAR UP TO 4 TIMES A DAY AS DIRECTED. 05/25/16   Harrison Mons, PA-C    Family History Family History  Problem Relation Age of Onset  . Dementia Father   . Heart disease Father   . Clotting disorder  Mother   . Arthritis Mother   . Clotting disorder Sister   . Clotting disorder Maternal Grandmother   . Clotting disorder Maternal Aunt   . Rheumatologic disease Neg Hx   . Hyperparathyroidism Neg Hx     Social History Social History  Substance Use Topics  . Smoking status: Former Smoker    Packs/day: 1.00    Years: 20.00    Quit date: 03/12/2004  . Smokeless tobacco: Never Used  . Alcohol use 0.6 oz/week    1 Cans of beer per week     Comment: 1-2 times a week     Allergies   Tramadol   Review of Systems Review of Systems  Unable to perform ROS: Mental status change     Physical Exam Updated Vital Signs BP 94/66 (BP Location: Right Arm)   Pulse 86   Temp 98.9 F (37.2 C) (Axillary)   Resp 18   Ht _0  (1.727 m)   Wt 247 lb (112 kg)   LMP  (LMP Unknown)   SpO2 95%   BMI 37.56 kg/m   Physical Exam  Constitutional: She appears well-developed.  Lethargic, NP airway in place, but arousable to painful stimuli  HENT:  Head: Normocephalic and atraumatic.  Markedly dry MM. No oral trauma. No tongue lesions.  Eyes: Conjunctivae are normal. Pupils are equal, round, and reactive to light.  Neck: Neck supple.  Cardiovascular: Normal rate, regular rhythm and normal heart sounds.  Exam reveals no friction rub.   No murmur heard. Pulmonary/Chest: Effort normal and breath sounds normal. No respiratory distress. She has no wheezes. She has no rales.  Abdominal: Soft.  Musculoskeletal: She exhibits no edema.  Neurological:  Face is symmetric. Tongue appears midline. Blinks to threat and eyes are concordant. Moves all extremities with at least antigravity strength. Withdraws to light touch in bilateral UE and LE distally. Remainder of neurological exam limited 2/2 altered mental status.   Skin: Skin is warm. Capillary refill takes less than 2 seconds. No rash noted.  Nursing note and vitals reviewed.    ED Treatments / Results  Labs (all labs ordered are listed, but  only abnormal results are displayed) Labs Reviewed  COMPREHENSIVE METABOLIC PANEL - Abnormal; Notable for the following:       Result Value   Glucose, Bld 167 (*)    BUN 21 (*)    Creatinine, Ser 1.12 (*)    Total Protein 6.3 (*)    AST 46 (*)    GFR calc non Af Amer 57 (*)    All other components within normal limits  ACETAMINOPHEN LEVEL - Abnormal; Notable for the following:    Acetaminophen (Tylenol), Serum <10 (*)    All other components within normal limits  RAPID URINE DRUG SCREEN, HOSP PERFORMED - Abnormal; Notable for the following:  Benzodiazepines POSITIVE (*)    All other components within normal limits  PROTIME-INR - Abnormal; Notable for the following:    Prothrombin Time 18.4 (*)    All other components within normal limits  URINALYSIS, ROUTINE W REFLEX MICROSCOPIC - Abnormal; Notable for the following:    Color, Urine AMBER (*)    APPearance HAZY (*)    Glucose, UA 50 (*)    Hgb urine dipstick MODERATE (*)    Bilirubin Urine SMALL (*)    Ketones, ur 20 (*)    Protein, ur 30 (*)    Bacteria, UA RARE (*)    Squamous Epithelial / LPF 0-5 (*)    All other components within normal limits  BLOOD GAS, VENOUS - Abnormal; Notable for the following:    pCO2, Ven 24.7 (*)    pO2, Ven 60.7 (*)    Bicarbonate 14.2 (*)    Acid-base deficit 9.5 (*)    All other components within normal limits  CBG MONITORING, ED - Abnormal; Notable for the following:    Glucose-Capillary 158 (*)    All other components within normal limits  CBG MONITORING, ED - Abnormal; Notable for the following:    Glucose-Capillary 129 (*)    All other components within normal limits  I-STAT BETA HCG BLOOD, ED ([facility], [facility], [facility] ONLY) - Abnormal; Notable for the following:    I-stat hCG, quantitative 6.6 (*)    All other components within normal limits  ETHANOL  SALICYLATE LEVEL  CBC  TSH  POC URINE PREG, ED    EKG  EKG Interpretation None       Radiology Dg Chest 2 View  Result Date:  06/11/2016 CLINICAL DATA:  Overdose on unknown substance. EXAM: CHEST  2 VIEW COMPARISON:  March 9th 2018 FINDINGS: The heart size and mediastinal contours are within normal limits. Both lungs are clear. The visualized skeletal structures are unremarkable. IMPRESSION: No active cardiopulmonary disease. Electronically Signed   By: Abelardo Diesel M.D.   On: 06/11/2016 20:41   Ct Head Wo Contrast  Result Date: 06/11/2016 CLINICAL DATA:  Overdose on unknown substance. EXAM: CT HEAD WITHOUT CONTRAST TECHNIQUE: Contiguous axial images were obtained from the base of the skull through the vertex without intravenous contrast. COMPARISON:  June 14, 2007 FINDINGS: Brain: No evidence of acute infarction, hemorrhage, hydrocephalus, extra-axial collection or mass lesion/mass effect. Vascular: No hyperdense vessel or unexpected calcification. Skull: Normal. Negative for fracture or focal lesion. Sinuses/Orbits: No acute finding. Other: None. IMPRESSION: No focal acute intracranial abnormality identified. Electronically Signed   By: Abelardo Diesel M.D.   On: 06/11/2016 19:41    Procedures Procedures (including critical care time)  Medications Ordered in ED Medications  naloxone [facility]) injection 0.4 mg (0.4 mg Intravenous Given 06/11/16 1821)  sodium chloride 0.9 % bolus 1,000 mL (0 mLs Intravenous Stopped 06/11/16 1906)  sodium chloride 0.9 % bolus 1,000 mL (0 mLs Intravenous Stopped 06/11/16 1906)  lactated ringers bolus 1,000 mL (0 mLs Intravenous Stopped 06/11/16 2110)     CRITICAL CARE Performed by: Evonnie Pat   Total critical care time: 35 minutes  Critical care time was exclusive of separately billable procedures and treating other patients.  Critical care was necessary to treat or prevent imminent or life-threatening deterioration.  Critical care was time spent personally by me on the following activities: development of treatment plan with patient and/or surrogate as well as nursing, discussions with  consultants, evaluation of patient's response to treatment, examination of patient, obtaining history  from patient or surrogate, ordering and performing treatments and interventions, ordering and review of laboratory studies, ordering and review of radiographic studies, pulse oximetry and re-evaluation of patient's condition.     Initial Impression / Assessment and Plan / ED Course  I have reviewed the triage vital signs and the nursing notes.  Pertinent labs & imaging results that were available during my care of the patient were reviewed by me and considered in my medical decision making (see chart for details).     51 year old female here with overdose of unknown medications. On arrival, she is lethargic but protecting her airway. She is arousable to painful stimuli. No apparent focal neuro deficits. Unclear what medications she took - no known antiHTN in the house and I suspect her hypotension is 2/2 sedating meds such as seroquel, also dehydration 2/2 poor PO intake. Will give fluids, check broad labs, and CT head. No apparent signs of seizure. EKG without changes or interval abnormalities.  Labs overall reassuring, are c/w significant dehydration but o/w normal. CT head neg. CXR clear. Normal LA, normal WBC and no signs of infection. Repeat blood gases remain stable. Pt protecting airway and increasingly alert - admit to step down given persistent AMS, need for close airway monitoring, unknown ingestants.  Final Clinical Impressions(s) / ED Diagnoses   Final diagnoses:  Lethargy  Intentional drug overdose, initial encounter ([place])  Dehydration  Suicide attempt      Duffy Bruce, MD 06/11/16 2310

## 2016-06-11 NOTE — ED Notes (Signed)
NPA 6 mm placed to right nare per Isaacs; pt adequate oxygen saturation with NPA.

## 2016-06-11 NOTE — ED Notes (Signed)
HUSBAND AT THE BEDSIDE AND HE STS THAT THE PT HAS BEEN DEPRESSED ALL DAY AND DID NOT COME OUT OF HER ROOM ALL DAY. HE STS HE HAD BEEN CHECKING ON HER THROUGH OUT THE DAY, AND THIS EVENING HE FOUND HER VERY SLEEPY AND LETHARGIC. HE STS HE FOUND A BOTTLE OF SEROQUEL IN HER BED AND SHE WEAKLY STS THAT SHE TOOK ONLY 1 PILL. HUSBAND STS THAT HE COUNTED THE PILLS, AND THEY WERE ALL ACCOUNTED FOR EXCEPT 1. HE STS HE ALSO CHECKED THE MEDICINE CABINET, AND THERE WAS NOTHING ELSE MISSING THAT HE IS AWARE OF.

## 2016-06-12 DIAGNOSIS — Z79899 Other long term (current) drug therapy: Secondary | ICD-10-CM | POA: Diagnosis not present

## 2016-06-12 DIAGNOSIS — G4730 Sleep apnea, unspecified: Secondary | ICD-10-CM | POA: Diagnosis present

## 2016-06-12 DIAGNOSIS — F319 Bipolar disorder, unspecified: Secondary | ICD-10-CM | POA: Diagnosis present

## 2016-06-12 DIAGNOSIS — F332 Major depressive disorder, recurrent severe without psychotic features: Secondary | ICD-10-CM | POA: Diagnosis not present

## 2016-06-12 DIAGNOSIS — T1491XA Suicide attempt, initial encounter: Secondary | ICD-10-CM

## 2016-06-12 DIAGNOSIS — G9340 Encephalopathy, unspecified: Secondary | ICD-10-CM | POA: Diagnosis not present

## 2016-06-12 DIAGNOSIS — Z7901 Long term (current) use of anticoagulants: Secondary | ICD-10-CM | POA: Diagnosis not present

## 2016-06-12 DIAGNOSIS — Z86718 Personal history of other venous thrombosis and embolism: Secondary | ICD-10-CM | POA: Diagnosis not present

## 2016-06-12 DIAGNOSIS — Z86711 Personal history of pulmonary embolism: Secondary | ICD-10-CM | POA: Diagnosis not present

## 2016-06-12 DIAGNOSIS — Z794 Long term (current) use of insulin: Secondary | ICD-10-CM | POA: Diagnosis not present

## 2016-06-12 DIAGNOSIS — T50902A Poisoning by unspecified drugs, medicaments and biological substances, intentional self-harm, initial encounter: Secondary | ICD-10-CM | POA: Diagnosis present

## 2016-06-12 DIAGNOSIS — Z87891 Personal history of nicotine dependence: Secondary | ICD-10-CM | POA: Diagnosis not present

## 2016-06-12 DIAGNOSIS — E119 Type 2 diabetes mellitus without complications: Secondary | ICD-10-CM | POA: Diagnosis present

## 2016-06-12 DIAGNOSIS — Z8261 Family history of arthritis: Secondary | ICD-10-CM | POA: Diagnosis not present

## 2016-06-12 DIAGNOSIS — E1169 Type 2 diabetes mellitus with other specified complication: Secondary | ICD-10-CM | POA: Diagnosis not present

## 2016-06-12 DIAGNOSIS — Z7984 Long term (current) use of oral hypoglycemic drugs: Secondary | ICD-10-CM | POA: Diagnosis not present

## 2016-06-12 DIAGNOSIS — Z6837 Body mass index (BMI) 37.0-37.9, adult: Secondary | ICD-10-CM | POA: Diagnosis not present

## 2016-06-12 DIAGNOSIS — Z810 Family history of intellectual disabilities: Secondary | ICD-10-CM | POA: Diagnosis not present

## 2016-06-12 DIAGNOSIS — Z832 Family history of diseases of the blood and blood-forming organs and certain disorders involving the immune mechanism: Secondary | ICD-10-CM | POA: Diagnosis not present

## 2016-06-12 DIAGNOSIS — Z8249 Family history of ischemic heart disease and other diseases of the circulatory system: Secondary | ICD-10-CM | POA: Diagnosis not present

## 2016-06-12 DIAGNOSIS — G92 Toxic encephalopathy: Secondary | ICD-10-CM | POA: Diagnosis present

## 2016-06-12 DIAGNOSIS — E860 Dehydration: Secondary | ICD-10-CM | POA: Diagnosis present

## 2016-06-12 DIAGNOSIS — E669 Obesity, unspecified: Secondary | ICD-10-CM | POA: Diagnosis present

## 2016-06-12 DIAGNOSIS — T424X2A Poisoning by benzodiazepines, intentional self-harm, initial encounter: Secondary | ICD-10-CM | POA: Diagnosis not present

## 2016-06-12 LAB — CBC
HCT: 36.4 % (ref 36.0–46.0)
Hemoglobin: 12.3 g/dL (ref 12.0–15.0)
MCH: 30.8 pg (ref 26.0–34.0)
MCHC: 33.8 g/dL (ref 30.0–36.0)
MCV: 91.2 fL (ref 78.0–100.0)
Platelets: 222 10*3/uL (ref 150–400)
RBC: 3.99 MIL/uL (ref 3.87–5.11)
RDW: 12.9 % (ref 11.5–15.5)
WBC: 9.3 10*3/uL (ref 4.0–10.5)

## 2016-06-12 LAB — HEPARIN LEVEL (UNFRACTIONATED)
Heparin Unfractionated: 1.02 IU/mL — ABNORMAL HIGH (ref 0.30–0.70)
Heparin Unfractionated: 0.84 IU/mL — ABNORMAL HIGH (ref 0.30–0.70)
Heparin Unfractionated: 0.83 IU/mL — ABNORMAL HIGH (ref 0.30–0.70)

## 2016-06-12 LAB — GLUCOSE, CAPILLARY
Glucose-Capillary: 109 mg/dL — ABNORMAL HIGH (ref 65–99)
Glucose-Capillary: 114 mg/dL — ABNORMAL HIGH (ref 65–99)
Glucose-Capillary: 118 mg/dL — ABNORMAL HIGH (ref 65–99)
Glucose-Capillary: 119 mg/dL — ABNORMAL HIGH (ref 65–99)
Glucose-Capillary: 125 mg/dL — ABNORMAL HIGH (ref 65–99)
Glucose-Capillary: 127 mg/dL — ABNORMAL HIGH (ref 65–99)
Glucose-Capillary: 127 mg/dL — ABNORMAL HIGH (ref 65–99)

## 2016-06-12 LAB — BASIC METABOLIC PANEL
Anion gap: 5 (ref 5–15)
BUN: 17 mg/dL (ref 6–20)
CO2: 25 mmol/L (ref 22–32)
Calcium: 8.1 mg/dL — ABNORMAL LOW (ref 8.9–10.3)
Chloride: 112 mmol/L — ABNORMAL HIGH (ref 101–111)
Creatinine, Ser: 0.59 mg/dL (ref 0.44–1.00)
GFR calc Af Amer: >60 mL/min (ref >60)
GFR calc non Af Amer: >60 mL/min (ref >60)
Glucose, Bld: 129 mg/dL — ABNORMAL HIGH (ref 65–99)
Potassium: 3.9 mmol/L (ref 3.5–5.1)
Sodium: 142 mmol/L (ref 135–145)

## 2016-06-12 LAB — MRSA PCR SCREENING: MRSA by PCR: NEGATIVE

## 2016-06-12 LAB — APTT
aPTT: 59 seconds — ABNORMAL HIGH (ref 24–36)
aPTT: 73 seconds — ABNORMAL HIGH (ref 24–36)
aPTT: 73 seconds — ABNORMAL HIGH (ref 24–36)

## 2016-06-12 LAB — HIV ANTIBODY (ROUTINE TESTING W REFLEX): HIV Screen 4th Generation wRfx: NONREACTIVE

## 2016-06-12 LAB — MAGNESIUM: Magnesium: 2.2 mg/dL (ref 1.7–2.4)

## 2016-06-12 LAB — CORTISOL: Cortisol, Plasma: 5.8 ug/dL

## 2016-06-12 MED ORDER — HEPARIN (PORCINE) IN NACL 100-0.45 UNIT/ML-% IJ SOLN
1600.0000 [IU]/h | INTRAMUSCULAR | Status: DC
  Administered 2016-06-12: 1600 [IU]/h via INTRAVENOUS
  Filled 2016-06-12 (×3): qty 250

## 2016-06-12 MED ORDER — HEPARIN BOLUS VIA INFUSION
3000.0000 [IU] | Freq: Once | INTRAVENOUS | Status: AC
  Administered 2016-06-12: 3000 [IU] via INTRAVENOUS
  Filled 2016-06-12: qty 3000

## 2016-06-12 MED ORDER — SODIUM CHLORIDE 0.9 % IV BOLUS (SEPSIS)
500.0000 mL | Freq: Once | INTRAVENOUS | Status: AC
  Administered 2016-06-12: 500 mL via INTRAVENOUS

## 2016-06-12 MED ORDER — PHENOL 1.4 % MT LIQD
1.0000 | OROMUCOSAL | Status: DC | PRN
  Administered 2016-06-13: 1 via OROMUCOSAL
  Filled 2016-06-12: qty 177

## 2016-06-12 MED ORDER — INSULIN ASPART 100 UNIT/ML ~~LOC~~ SOLN
0.0000 [IU] | SUBCUTANEOUS | Status: DC
  Administered 2016-06-12 (×2): 1 [IU] via SUBCUTANEOUS
  Administered 2016-06-14: 2 [IU] via SUBCUTANEOUS
  Administered 2016-06-14: 1 [IU] via SUBCUTANEOUS

## 2016-06-12 MED ORDER — SODIUM CHLORIDE 0.9 % IV BOLUS (SEPSIS)
1000.0000 mL | Freq: Once | INTRAVENOUS | Status: AC
  Administered 2016-06-12: 1000 mL via INTRAVENOUS

## 2016-06-12 MED ORDER — ORAL CARE MOUTH RINSE
15.0000 mL | Freq: Two times a day (BID) | OROMUCOSAL | Status: DC
  Administered 2016-06-14: 15 mL via OROMUCOSAL

## 2016-06-12 MED ORDER — LORAZEPAM 2 MG/ML IJ SOLN
0.5000 mg | INTRAMUSCULAR | Status: DC | PRN
  Administered 2016-06-12 – 2016-06-14 (×4): 0.5 mg via INTRAVENOUS
  Filled 2016-06-12 (×5): qty 1

## 2016-06-12 MED ORDER — HEPARIN (PORCINE) IN NACL 100-0.45 UNIT/ML-% IJ SOLN
1400.0000 [IU]/h | INTRAMUSCULAR | Status: DC
  Administered 2016-06-12: 1400 [IU]/h via INTRAVENOUS
  Filled 2016-06-12: qty 250

## 2016-06-12 MED ORDER — MAGNESIUM SULFATE 2 GM/50ML IV SOLN
2.0000 g | Freq: Once | INTRAVENOUS | Status: AC
  Administered 2016-06-12: 2 g via INTRAVENOUS
  Filled 2016-06-12: qty 50

## 2016-06-12 MED ORDER — CHLORHEXIDINE GLUCONATE 0.12 % MT SOLN
15.0000 mL | Freq: Two times a day (BID) | OROMUCOSAL | Status: DC
  Administered 2016-06-13 – 2016-06-14 (×2): 15 mL via OROMUCOSAL
  Filled 2016-06-12 (×2): qty 15

## 2016-06-12 NOTE — Progress Notes (Signed)
ANTICOAGULATION CONSULT NOTE - follow up  Pharmacy Consult for IV Heparin (on Xarelto PTA) Indication: pulmonary embolus  Allergies  Allergen Reactions  . Tramadol Other (See Comments)    Tingling all over     Patient Measurements: Height:  (172.7 cm) Weight: 247 lb (112 kg) IBW/kg (Calculated) : 63.9 Heparin Dosing Weight: 78 kg  Vital Signs: Temp: 97.4 F (36.3 C) (04/03 0809) Temp Source: Oral (04/03 0809) BP: 64/45 (04/03 0635) Pulse Rate: 81 (04/03 0635)  Labs:  Recent Labs  06/11/16 1822 06/11/16 1850 06/11/16 2319 06/11/16 2324 06/12/16 0313 06/12/16 0647  HGB 14.0  --   --   --  12.3  --   HCT 41.0  --   --   --  36.4  --   PLT 266  --   --   --  222  --   APTT  --   --  28  --   --  59*  LABPROT  --  18.4*  --   --   --   --   INR  --  1.52  --   --   --   --   HEPARINUNFRC  --   --   --  1.02*  --  0.84*  CREATININE 1.12*  --   --   --  0.59  --     Estimated Creatinine Clearance: 112.8 mL/min (by C-G formula based on SCr of 0.59 mg/dL).   Medical History: Past Medical History:  Diagnosis Date  . Asthma   . Depression   . Diabetes mellitus type 2 in obese (HCC) 06/11/2016  . Embolism - blood clot January 1997 & January 2017   Also had LLE DVT in 1997    Medications:  Scheduled:  . insulin aspart  0-9 Units Subcutaneous Q4H   Infusions:  . sodium chloride 100 mL/hr at 06/12/16 0018  . heparin 1,400 Units/hr (06/12/16 0017)    Assessment: 50 yoF s/p drug OD of unknown medications with altered mental status on chronic xarelto for hx of DVT/PE.  IV heparin per Rx.  LD xarelto 4/1 at 2030 4/2 baseline coags: INR=1.52, aptt=28,HL=1.02  Today, 06/12/16  APTT slightly subtherapeutic on IV heparin rate of 1400 units/hr while HL remains supratherapeutic due to Xarelto  CBC stable  No reported bleeding  Will use aPTT until they correlate with HL d/t recent xarelto use  Await orders to resume Xarelto once patient's acute encephalopathy  improves and can restart orals  Goal of Therapy:  aPTT= 66-102 Heparin level 0.3-0.7 units/ml Monitor platelets by anticoagulation protocol: Yes   Plan:  1) Increase IV heparin rate from 1400 units/hr to 1600 units/hr 2) Recheck aPTT and heparin level 6 hours after heparin rate increase 3) Daily aPTT and HL   Hessie Knows, PharmD, BCPS Pager [phone] 06/12/2016 8:36 AM

## 2016-06-12 NOTE — Progress Notes (Addendum)
CSW received a request from the pt's daytime RN for completed IVC paperwork for the pt who had been attempting to leave.  CSW completed paperwork and called pt's RN back.  Pt's nighttime RN answered and stated daytime RN had left for the day and that the pt's provider during the evening was a P.A.who could not sign pt's IVC.  CSW attempted to page the pt's P.A. three times at [phone] to request a physician be contacted who could sign IVC, and did not receive a return call.  CSW updated RN and asked that the CSW be contacted if the CSW spoke to the pt's provider.    9:38 PM CSW attempted to page pt's P.A. again, received no answer.  10:12 PM CSW met with AC and went to ICU and met with the RN and the CN and was informed IVC paperwork was being completed by the NP.  CSW provided pt's CN with the CSW's number in case additional IVC paperwork was needed.  Please reconsult if future social work needs arise.      F. , LCSWA, LCAS Clinical Social Worker Ph: [phone]      

## 2016-06-12 NOTE — Progress Notes (Addendum)
ANTICOAGULATION CONSULT NOTE - follow up  Pharmacy Consult for IV Heparin (on Xarelto PTA) Indication: pulmonary embolus  Allergies  Allergen Reactions  . Tramadol Other (See Comments)    Tingling all over     Patient Measurements: Height:  (172.7 cm) Weight: 247 lb (112 kg) IBW/kg (Calculated) : 63.9 Heparin Dosing Weight: 78 kg  Vital Signs: Temp: 97.6 F (36.4 C) (04/03 1203) Temp Source: Oral (04/03 1203) BP: 101/80 (04/03 1200) Pulse Rate: 89 (04/03 1500)  Labs:  Recent Labs  06/11/16 1822 06/11/16 1850 06/11/16 2319 06/11/16 2324 06/12/16 0313 06/12/16 0647 06/12/16 1458  HGB 14.0  --   --   --  12.3  --   --   HCT 41.0  --   --   --  36.4  --   --   PLT 266  --   --   --  222  --   --   APTT  --   --  28  --   --  59* 73*  LABPROT  --  18.4*  --   --   --   --   --   INR  --  1.52  --   --   --   --   --   HEPARINUNFRC  --   --   --  1.02*  --  0.84* 0.83*  CREATININE 1.12*  --   --   --  0.59  --   --     Estimated Creatinine Clearance: 112.8 mL/min (by C-G formula based on SCr of 0.59 mg/dL).   Medical History: Past Medical History:  Diagnosis Date  . Asthma   . Depression   . Diabetes mellitus type 2 in obese (HCC) 06/11/2016  . Embolism - blood clot January 1997 & January 2017   Also had LLE DVT in 1997    Medications:  Scheduled:  . insulin aspart  0-9 Units Subcutaneous Q4H   Infusions:  . sodium chloride 100 mL/hr at 06/12/16 1500  . heparin 1,600 Units/hr (06/12/16 1500)    Assessment: 50 yoF s/p drug OD of unknown medications with altered mental status on chronic xarelto for hx of DVT/PE.  IV heparin per Rx.  LD xarelto 4/1 at 2030 4/2 baseline coags: INR=1.52, aptt=28,HL=1.02  Today, 06/12/16  APTT = 73 sec after heparin rate increase to 1600 units/hr   HL remains supratherapeutic due to Xarelto  CBC stable  No reported bleeding  Will use aPTT until they correlate with HL d/t recent xarelto use  Await orders to  resume Xarelto once patient's acute encephalopathy improves and can restart orals  Goal of Therapy:  aPTT= 66-102 Heparin level 0.3-0.7 units/ml Monitor platelets by anticoagulation protocol: Yes   Plan:  1) Continue IV heparin rate @ 1600 units/hr 2) Recheck aPTT in 6 hours to confirm therapeutic dose 3) Daily aPTT and HL   Cristin Penaflor, PharmD 06/12/2016 4:21 PM  ADDENDUM:  Repeat aPTT 6 hr after previous aPTT = 73 sec (Goal 66-102 sec) with heparin infusing @ 1600 units/hr  No complications of therapy noted  Plan:  Continue IV heparin @ 1600 units/hr            Check AM aPTT, heparin level, CBC  Terrilee Files, PharmD 06/12/2016 @ 21:20

## 2016-06-12 NOTE — Care Management Note (Signed)
Case Management Note  Patient Details  Name: Deborah Soto MRN: [id number] Date of Birth: Apr 07, 1965  Subjective/Objective:       overdose             Action/Plan:Date:  June 12, 2016 Chart reviewed for concurrent status and case management needs. Will continue to follow patient progress. Discharge Planning: following for needs Expected discharge date: [id number] [name], BSN, [place], [place]   [phone]   Expected Discharge Date:   (unknown)               Expected Discharge Plan:  Home/Self Care  In-House Referral:  Clinical Social Work  Discharge planning Services     Post Acute Care Choice:    Choice offered to:     DME Arranged:    DME Agency:     HH Arranged:    HH Agency:     Status of Service:  In process, will continue to follow  If discussed at Long Length of Stay Meetings, dates discussed:    Additional Comments:  [name], RN 06/12/2016, 10:37 AM

## 2016-06-12 NOTE — Progress Notes (Addendum)
PROGRESS NOTE  Deborah Soto ZOX:[id number] DOB: Feb 28, 1966 DOA: 06/11/2016 PCP: Trena Platt, PA   LOS: 0 days   Brief Narrative: Deborah Soto is a 51 y.o. female with history of recently diagnosed diabetes mellitus type 2, sleep apnea, history of DVT and PE on xarelto and history of depression was brought to the ER after patient was found to be increasingly lethargic and drowsy by the husband. As per the husband patient has been depressed last 2 days and has been threatening suicide.   Assessment & Plan: Principal Problem:   Acute encephalopathy Active Problems:   PTE (pulmonary thromboembolism) (HCC)   Diabetes mellitus type 2 in obese (HCC)   Suicidal ideation   Acute encephalopathy -Likely due to overdose of unknown substance -Discussed with husband at bedside, he found a text from her directed to him saying something along the lines of "goodbye" -Patient appears more alert this morning, she does not wish to talk to me about what has happened  Depression/suicidal ideation -Psychiatry consulted -Patient was just started on Seroquel she took only 1 dose, this is confirmed by her husband.  He does however states that she is having clonazepam at home, and he thinks she might have overdosed on the  History of PE and DVT -Keep on IV heparin until able to take p.o. consistently  Recently diagnosed type 2 diabetes -Keep on sliding scale  Sleep apnea -Nightly CPAP    DVT prophylaxis: heparin infusion Code Status: Full code Family Communication: d/w husband at bedside Disposition Plan: remain in SDU today  Consultants:   Psychiatry   Procedures:   None  Antimicrobials:  None    Subjective: -CPAP on, she wakes up when prompted, able to nod "no" when asked whether she has any pain, however does not want to talk to be further will happen now  Objective: Vitals:   06/12/16 0809 06/12/16 0900 06/12/16 1000 06/12/16 1100  BP:   96/71   Pulse:  81 86 81   Resp:  Temp: 97.4 F (36.3 C)     TempSrc: Oral     SpO2:  94% 97% 95%  Weight:      Height:        Intake/Output Summary (Last 24 hours) at 06/12/16 1146 Last data filed at 06/12/16 0910  Gross per 24 hour  Intake          3258.03 ml  Output              250 ml  Net          3008.03 ml   Filed Weights   06/11/16 1846  Weight: 112 kg (247 lb)    Examination: Constitutional: NAD Vitals:   06/12/16 0809 06/12/16 0900 06/12/16 1000 06/12/16 1100  BP:   96/71   Pulse:  81 86 81  Resp:  Temp: 97.4 F (36.3 C)     TempSrc: Oral     SpO2:  94% 97% 95%  Weight:      Height:       Eyes: PERRL, lids and conjunctivae normal Respiratory: clear to auscultation bilaterally, no wheezing, no crackles.  Cardiovascular: Regular rate and rhythm, no murmurs / rubs / gallops. No LE edema. 2+ pedal pulses. Abdomen: no tenderness. Bowel sounds positive.  Musculoskeletal: no clubbing / cyanosis.  Skin: no rashes, lesions, ulcers. No induration Neurologic: non focal   Data Reviewed: I have personally reviewed following labs and imaging studies  CBC:  Recent Labs Lab 06/11/16 1822 06/12/16 0313  WBC 9.0 9.3  HGB 14.0 12.3  HCT 41.0 36.4  MCV 88.0 91.2  PLT 266 222   Basic Metabolic Panel:  Recent Labs Lab 06/08/16 0940 06/11/16 1822 06/11/16 1926 06/12/16 0313 06/12/16 0647  NA 141 141  --  142  --   K 4.2 4.4  --  3.9  --   CL 101 109  --  112*  --   CO2 21 24  --  25  --   GLUCOSE 163* 167*  --  129*  --   BUN 16 21*  --  17  --   CREATININE 0.60 1.12*  --  0.59  --   CALCIUM 9.9 8.9  --  8.1*  --   MG  --   --  0.8*  --  2.2   GFR: Estimated Creatinine Clearance: 112.8 mL/min (by C-G formula based on SCr of 0.59 mg/dL). Liver Function Tests:  Recent Labs Lab 06/08/16 0940 06/11/16 1822  AST 36 46*  ALT 45* 50  ALKPHOS 56 44  BILITOT 0.6 1.0  PROT 6.5 6.3*  ALBUMIN 4.6 4.0   No results for input(s): LIPASE, AMYLASE in the last  168 hours.  Recent Labs Lab 06/11/16 2306  AMMONIA 25   Coagulation Profile:  Recent Labs Lab 06/11/16 1850  INR 1.52   Cardiac Enzymes: No results for input(s): CKTOTAL, CKMB, CKMBINDEX, TROPONINI in the last 168 hours. BNP (last 3 results) No results for input(s): PROBNP in the last 8760 hours. HbA1C: No results for input(s): HGBA1C in the last 72 hours. CBG:  Recent Labs Lab 06/11/16 2331 06/12/16 0059 06/12/16 0332 06/12/16 0743 06/12/16 1125  GLUCAP 126* 127* 119* 125* 118*   Lipid Profile: No results for input(s): CHOL, HDL, LDLCALC, TRIG, CHOLHDL, LDLDIRECT in the last 72 hours. Thyroid Function Tests:  Recent Labs  06/11/16 1919  TSH 0.676   Anemia Panel: No results for input(s): VITAMINB12, FOLATE, FERRITIN, TIBC, IRON, RETICCTPCT in the last 72 hours. Urine analysis:    Component Value Date/Time   COLORURINE AMBER (A) 06/11/2016 1919   APPEARANCEUR HAZY (A) 06/11/2016 1919   LABSPEC 1.023 06/11/2016 1919   PHURINE 5.0 06/11/2016 1919   GLUCOSEU 50 (A) 06/11/2016 1919   HGBUR MODERATE (A) 06/11/2016 1919   BILIRUBINUR SMALL (A) 06/11/2016 1919   BILIRUBINUR small (A) 06/08/2016 0922   BILIRUBINUR neg 07/29/2014 1536   KETONESUR 20 (A) 06/11/2016 1919   PROTEINUR 30 (A) 06/11/2016 1919   UROBILINOGEN 0.2 06/08/2016 0922   NITRITE NEGATIVE 06/11/2016 1919   LEUKOCYTESUR NEGATIVE 06/11/2016 1919   Sepsis Labs: Invalid input(s): PROCALCITONIN, LACTICIDVEN  Recent Results (from the past 240 hour(s))  Urine culture     Status: None   Collection Time: 06/08/16  9:55 AM  Result Value Ref Range Status   Urine Culture, Routine Final report  Final   Urine Culture result 1 No growth  Final  MRSA PCR Screening     Status: None   Collection Time: 06/12/16 12:43 AM  Result Value Ref Range Status   MRSA by PCR NEGATIVE NEGATIVE Final    Comment:        The GeneXpert MRSA Assay (FDA approved for NASAL specimens only), is one component of  a comprehensive MRSA colonization surveillance program. It is not intended to diagnose MRSA infection nor to guide or monitor treatment for MRSA infections.       Radiology Studies: Dg Chest 2 View  Result Date: 06/11/2016 CLINICAL DATA:  Overdose on unknown substance. EXAM: CHEST  2 VIEW COMPARISON:  March 9th 2018 FINDINGS: The heart size and mediastinal contours are within normal limits. Both lungs are clear. The visualized skeletal structures are unremarkable. IMPRESSION: No active cardiopulmonary disease. Electronically Signed   By: Sherian Rein M.D.   On: 06/11/2016 20:41   Ct Head Wo Contrast  Result Date: 06/11/2016 CLINICAL DATA:  Overdose on unknown substance. EXAM: CT HEAD WITHOUT CONTRAST TECHNIQUE: Contiguous axial images were obtained from the base of the skull through the vertex without intravenous contrast. COMPARISON:  June 14, 2007 FINDINGS: Brain: No evidence of acute infarction, hemorrhage, hydrocephalus, extra-axial collection or mass lesion/mass effect. Vascular: No hyperdense vessel or unexpected calcification. Skull: Normal. Negative for fracture or focal lesion. Sinuses/Orbits: No acute finding. Other: None. IMPRESSION: No focal acute intracranial abnormality identified. Electronically Signed   By: Sherian Rein M.D.   On: 06/11/2016 19:41     Scheduled Meds: . insulin aspart  0-9 Units Subcutaneous Q4H   Continuous Infusions: . sodium chloride 100 mL/hr at 06/12/16 1132  . heparin 1,600 Units/hr (06/12/16 0910)    Pamella Pert, MD, PhD [facility] Pager [phone]  If 7PM-7AM, please contact night-coverage [email or web address] Password [email or web address] 06/12/2016, 11:46 AM

## 2016-06-12 NOTE — Progress Notes (Signed)
Pt requesting for CPAP to be taken off.  Pt states that she doesn't want to use it.  Pt agitated and wanting to leave hospital.  RT to monitor and assess as needed.

## 2016-06-12 NOTE — Progress Notes (Signed)
Nutrition Brief Note  Patient identified on the Malnutrition Screening Tool (MST) Report  Wt Readings from Last 15 Encounters:  06/11/16 247 lb (112 kg)  06/08/16 247 lb 8 oz (112.3 kg)  05/30/16 251 lb 6.4 oz (114 kg)  05/23/16 252 lb (114.3 kg)  05/18/16 250 lb (113.4 kg)  05/15/16 251 lb 3.2 oz (113.9 kg)  04/03/16 251 lb (113.9 kg)  02/24/16 249 lb (112.9 kg)  02/01/16 247 lb (112 kg)  01/25/16 245 lb (111.1 kg)  01/09/16 243 lb (110.2 kg)  12/23/15 238 lb 3.2 oz (108 kg)  12/16/15 239 lb (108.4 kg)  12/15/15 236 lb (107 kg)  12/07/15 240 lb (108.9 kg)    Body mass index is 37.56 kg/m. Patient meets criteria for obesity based on current BMI. Skin WDL.  Pt admitted with acute encephalopathy after a potential OD as SA. Pt with hx of depression. She was very lethargic at time of visit. Sitter and husband at bedside. Husband states that pt has a very good appetite, but that her last meal was dinner on Saturday. This was d/t an argument at home which led to pt refusing to eat on Sunday or Monday. Husband states that this is the first time pt has refused to eat; event was very unusual for her. Husband states that pt was recently dx with Type 2 DM and that she has vastly changed her eating habits. She is no longer drinking soda or eating ice cream. For breakfast she might have 2 eggs, lunch is a large, mixed salad, and dinner might be a protein source and non-starchy vegetable. Husband states pt informed him of 4-5 lb weight loss over the past 2 weeks which he feels is related to her change in eating habits/meal choices.   Current diet order is NPO. Labs and medications reviewed. No nutrition interventions warranted at this time. If nutrition issues arise, please consult RD.     Trenton Gammon, MS, RD, LDN, [facility] In[facility] Clinical Dietitian Pager # [phone] After hours/weekend pager # [phone]

## 2016-06-12 NOTE — Progress Notes (Signed)
Pt is agitated and trying to leave. States "I want to go home and die". MD notified, sitter at the bedside.  Will continue to monitor.

## 2016-06-12 NOTE — Consult Note (Signed)
[facility] Consult   Reason for Consult:  Suicide attempt, overdose Referring Physician:  McMillin Patient Identification: Deborah Soto MRN:  [id number] Principal Diagnosis: Acute encephalopathy with suicidal overdose Diagnosis:   Patient Active Problem List   Diagnosis Date Noted  . Acute encephalopathy [G93.40] 06/11/2016    Priority: High  . Suicidal ideation [R45.851] 06/11/2016    Priority: High  . Bipolar affective disorder ([place]) [F31.9] 10/10/2011    Priority: High  . Diabetes mellitus type 2 in obese ([place]) [E11.69, E66.9] 06/11/2016  . Positive ANA (antinuclear antibody) [R76.8] 04/02/2016  . Hyperparathyroidism ([place]) [E21.3] 01/09/2016  . Snoring [R06.83] 09/20/2015  . 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] 05/12/2015  . Hepatic steatosis [K76.0] 04/18/2015  . Left sided chest pain [R07.9]   . Dyspnea [R06.00]   . Achilles tendonitis [M76.60] 04/08/2012  . 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] 04/08/2012  . PTE (pulmonary thromboembolism) ([place]) [I26.99] 10/10/2011  . Preeclampsia [O14.90] 10/10/2011  . Diverticulitis [K57.92] 10/10/2011  . BMI 34.0-34.9,adult [Z68.34] 10/10/2011  . AR (allergic rhinitis) [J30.9] 10/10/2011    Total Time spent with patient: 40 minutes  Subjective:   Deborah Soto is a 51 y.o. female patient admitted with reports of intentional overdose in suicide attempt. Pt seen and chart reviewed. Pt is alert/oriented x4, calm, cooperative, and appropriate to situation. Pt denies homicidal ideation and psychosis and does not appear to be responding to internal stimuli. Pt was extremely somnolent during interview and was able to be woken up but had to be awoken 5 times to complete the assessment. Pt reports 2 serious overdose attempts before this one and reports "I don't even know what I took or what happened but I have been suicidal every day and very depressed for 15 years." [facility] will attempt to obtain  collateral from pt's family to attempt to ascertain what pills may have been ingested yet disposition will remain the same: inpatient psychiatric hospitalization.   HPI:  I have reviewed and concur with HPI elements below, modified as follows;  "Deborah Soto is a 51 y.o. female with history of recently diagnosed diabetes mellitus type 2, sleep apnea, history of DVT and PE on xarelto and history of depression was brought to the ER after patient was found to be increasingly lethargic and drowsy by the husband. As per the husband patient has been depressed last 2 days and has been threatening suicide. Patient had taken her home medications yesterday. This morning when husband was planning to give the medications to her she refused. Since patient was looking more lethargic husband plan to work at home. In the afternoon patient was found walking in the kitchen and later on was found to be on the bed lethargic. Later in the evening around 4-5 PM husband called EMS and patient was brought to the ER."  Pt spent the night int he hospital without incident yet has been lethargic and somnolent. Seen today as above on 06/12/16 for psychiatric evaluation. Staff report that pt has been cooperative. Safety sitter in place due to high risk.  Past Psychiatric History: depression, anxiety  Risk to Self: Is patient at risk for suicide?: Yes Risk to Others:   Prior Inpatient Therapy:   Prior Outpatient Therapy:    Past Medical History:  Past Medical History:  Diagnosis Date  . Asthma   . Depression   . Diabetes mellitus type 2 in obese ([place]) 06/11/2016  . Embolism - blood clot January 1997 & January 2017   Also had  LLE DVT in 1997    Past Surgical History:  Procedure Laterality Date  . NO PAST SURGERIES     Family History:  Family History  Problem Relation Age of Onset  . Dementia Father   . Heart disease Father   . Clotting disorder Mother   . Arthritis Mother   . Clotting disorder Sister   . Clotting  disorder Maternal Grandmother   . Clotting disorder Maternal Aunt   . Rheumatologic disease Neg Hx   . Hyperparathyroidism Neg Hx    Family Psychiatric  History: denies Social History:  History  Alcohol Use  . 0.6 oz/week  . 1 Cans of beer per week    Comment: 1-2 times a week     History  Drug Use No    Social History   Social History  . Marital status: Married    Spouse name: N/A  . Number of children: Y  . Years of education: N/A   Occupational History  . admin assist    Social History Main Topics  . Smoking status: Former Smoker    Packs/day: 1.00    Years: 20.00    Quit date: 03/12/2004  . Smokeless tobacco: Never Used  . Alcohol use 0.6 oz/week    1 Cans of beer per week     Comment: 1-2 times a week  . Drug use: No  . Sexual activity: Not Asked   Other Topics Concern  . None   Social History Narrative   Originally from [place]. Always lived in [place]. 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 No international travel recently. Previously has been to [place] in May 1996. No mold exposure recently. No bird exposure. Does have multiple pets.    Additional Social History:    Allergies:   Allergies  Allergen Reactions  . Tramadol Other (See Comments)    Tingling all over     Labs:  Results for orders placed or performed during the hospital encounter of 06/11/16 (from the past 48 hour(s))  CBG monitoring, ED     Status: Abnormal   Collection Time: 06/11/16  6:11 PM  Result Value Ref Range   Glucose-Capillary 158 (H) 65 - 99 mg/dL  Comprehensive metabolic panel     Status: Abnormal   Collection Time: 06/11/16  6:22 PM  Result Value Ref Range   Sodium 141 135 - 145 mmol/L   Potassium 4.4 3.5 - 5.1 mmol/L   Chloride 109 101 - 111 mmol/L   CO2 24 22 - 32 mmol/L   Glucose, Bld 167 (H) 65 - 99 mg/dL   BUN 21 (H) 6 - 20 mg/dL   Creatinine, Ser 1.12 (H) 0.44 - 1.00 mg/dL   Calcium 8.9 8.9 - 10.3 mg/dL   Total Protein 6.3 (L) 6.5 - 8.1 g/dL   Albumin 4.0 3.5  - 5.0 g/dL   AST 46 (H) 15 - 41 U/L   ALT 50 14 - 54 U/L   Alkaline Phosphatase 44 38 - 126 U/L   Total Bilirubin 1.0 0.3 - 1.2 mg/dL   GFR calc non Af Amer 57 (L) >60 mL/min   GFR calc Af Amer >60 >60 mL/min    Comment: (NOTE) The eGFR has been calculated using the CKD EPI equation. This calculation has not been validated in all clinical situations. eGFR's persistently <60 mL/min signify possible Chronic Kidney Disease.    Anion gap 8 5 - 15  Ethanol     Status: None   Collection Time: 06/11/16  6:22 PM  Result Value Ref Range   Alcohol, Ethyl (B) <5 <5 mg/dL    Comment:        LOWEST DETECTABLE LIMIT FOR SERUM ALCOHOL IS 5 mg/dL FOR MEDICAL PURPOSES ONLY   Salicylate level     Status: None   Collection Time: 06/11/16  6:22 PM  Result Value Ref Range   Salicylate Lvl <1.0 2.8 - 30.0 mg/dL  Acetaminophen level     Status: Abnormal   Collection Time: 06/11/16  6:22 PM  Result Value Ref Range   Acetaminophen (Tylenol), Serum <10 (L) 10 - 30 ug/mL    Comment:        THERAPEUTIC CONCENTRATIONS VARY SIGNIFICANTLY. A RANGE OF 10-30 ug/mL MAY BE AN EFFECTIVE CONCENTRATION FOR MANY PATIENTS. HOWEVER, SOME ARE BEST TREATED AT CONCENTRATIONS OUTSIDE THIS RANGE. ACETAMINOPHEN CONCENTRATIONS >150 ug/mL AT 4 HOURS AFTER INGESTION AND >50 ug/mL AT 12 HOURS AFTER INGESTION ARE OFTEN ASSOCIATED WITH TOXIC REACTIONS.   cbc     Status: None   Collection Time: 06/11/16  6:22 PM  Result Value Ref Range   WBC 9.0 4.0 - 10.5 K/uL   RBC 4.66 3.87 - 5.11 MIL/uL   Hemoglobin 14.0 12.0 - 15.0 g/dL   HCT 41.0 36.0 - 46.0 %   MCV 88.0 78.0 - 100.0 fL   MCH 30.0 26.0 - 34.0 pg   MCHC 34.1 30.0 - 36.0 g/dL   RDW 12.7 11.5 - 15.5 %   Platelets 266 150 - 400 K/uL  I-Stat beta hCG blood, ED     Status: Abnormal   Collection Time: 06/11/16  6:32 PM  Result Value Ref Range   I-stat hCG, quantitative 6.6 (H) <5 mIU/mL   Comment 3            Comment:   GEST. AGE      CONC.  (mIU/mL)   <=1  WEEK        5 - 50     2 WEEKS       50 - 500     3 WEEKS       100 - 10,000     4 WEEKS     1,000 - 30,000        FEMALE AND NON-PREGNANT FEMALE:     LESS THAN 5 mIU/mL   Protime-INR     Status: Abnormal   Collection Time: 06/11/16  6:50 PM  Result Value Ref Range   Prothrombin Time 18.4 (H) 11.4 - 15.2 seconds   INR 1.52   Rapid urine drug screen (hospital performed)     Status: Abnormal   Collection Time: 06/11/16  7:19 PM  Result Value Ref Range   Opiates NONE DETECTED NONE DETECTED   Cocaine NONE DETECTED NONE DETECTED   Benzodiazepines POSITIVE (A) NONE DETECTED   Amphetamines NONE DETECTED NONE DETECTED   Tetrahydrocannabinol NONE DETECTED NONE DETECTED   Barbiturates NONE DETECTED NONE DETECTED    Comment:        DRUG SCREEN FOR MEDICAL PURPOSES ONLY.  IF CONFIRMATION IS NEEDED FOR ANY PURPOSE, NOTIFY LAB WITHIN 5 DAYS.        LOWEST DETECTABLE LIMITS FOR URINE DRUG SCREEN Drug Class       Cutoff (ng/mL) Amphetamine      1000 Barbiturate      200 Benzodiazepine   626 Tricyclics       948 Opiates          300 Cocaine  300 THC              50   Urinalysis, Routine w reflex microscopic     Status: Abnormal   Collection Time: 06/11/16  7:19 PM  Result Value Ref Range   Color, Urine AMBER (A) YELLOW    Comment: BIOCHEMICALS MAY BE AFFECTED BY COLOR   APPearance HAZY (A) CLEAR   Specific Gravity, Urine 1.023 1.005 - 1.030   pH 5.0 5.0 - 8.0   Glucose, UA 50 (A) NEGATIVE mg/dL   Hgb urine dipstick MODERATE (A) NEGATIVE   Bilirubin Urine SMALL (A) NEGATIVE   Ketones, ur 20 (A) NEGATIVE mg/dL   Protein, ur 30 (A) NEGATIVE mg/dL   Nitrite NEGATIVE NEGATIVE   Leukocytes, UA NEGATIVE NEGATIVE   RBC / HPF TOO NUMEROUS TO COUNT 0 - 5 RBC/hpf   WBC, UA 0-5 0 - 5 WBC/hpf   Bacteria, UA RARE (A) NONE SEEN   Squamous Epithelial / LPF 0-5 (A) NONE SEEN   Mucous PRESENT    Hyaline Casts, UA PRESENT   TSH     Status: None   Collection Time: 06/11/16  7:19 PM   Result Value Ref Range   TSH 0.676 0.350 - 4.500 uIU/mL    Comment: Performed by a 3rd Generation assay with a functional sensitivity of <=0.01 uIU/mL.  Magnesium     Status: Abnormal   Collection Time: 06/11/16  7:26 PM  Result Value Ref Range   Magnesium 0.8 (LL) 1.7 - 2.4 mg/dL    Comment: CRITICAL RESULT CALLED TO, READ BACK BY AND VERIFIED WITH: C DENNY RN 0000 06/12/16 A NAVARRO   Blood gas, venous     Status: Abnormal   Collection Time: 06/11/16  7:30 PM  Result Value Ref Range   pH, Ven 7.379 7.250 - 7.430   pCO2, Ven 24.7 (L) 44.0 - 60.0 mmHg   pO2, Ven 60.7 (H) 32.0 - 45.0 mmHg   Bicarbonate 14.2 (L) 20.0 - 28.0 mmol/L   Acid-base deficit 9.5 (H) 0.0 - 2.0 mmol/L   O2 Saturation 88.9 %   Patient temperature 98.6    Collection site VEIN    Drawn by COLLECTED BY NURSE    Sample type VEIN   POC Urine Pregnancy, ED (do NOT order at [facility])     Status: None   Collection Time: 06/11/16  7:36 PM  Result Value Ref Range   Preg Test, Ur NEGATIVE NEGATIVE    Comment:        THE SENSITIVITY OF THIS METHODOLOGY IS >24 mIU/mL   CBG monitoring, ED     Status: Abnormal   Collection Time: 06/11/16  7:53 PM  Result Value Ref Range   Glucose-Capillary 129 (H) 65 - 99 mg/dL  Blood gas, arterial ([facility] & [facility] ONLY)     Status: Abnormal   Collection Time: 06/11/16 10:10 PM  Result Value Ref Range   FIO2 21.00    pH, Arterial 7.422 7.350 - 7.450   pCO2 arterial 34.7 32.0 - 48.0 mmHg   pO2, Arterial 76.6 (L) 83.0 - 108.0 mmHg   Bicarbonate 22.2 20.0 - 28.0 mmol/L   Acid-base deficit 1.2 0.0 - 2.0 mmol/L   O2 Saturation 94.9 %   Patient temperature 98.6    Collection site RIGHT RADIAL    Drawn by [id number]    Sample type ARTERIAL DRAW    Allens test (pass/fail) PASS PASS  I-Stat CG4 Lactic Acid, ED     Status: None  Collection Time: 06/11/16 10:38 PM  Result Value Ref Range   Lactic Acid, Venous 0.98 0.5 - 1.9 mmol/L  Ammonia     Status: None   Collection Time: 06/11/16 11:06 PM   Result Value Ref Range   Ammonia 25 9 - 35 umol/L  APTT     Status: None   Collection Time: 06/11/16 11:19 PM  Result Value Ref Range   aPTT 28 24 - 36 seconds  Heparin level (unfractionated)     Status: Abnormal   Collection Time: 06/11/16 11:24 PM  Result Value Ref Range   Heparin Unfractionated 1.02 (H) 0.30 - 0.70 IU/mL    Comment: RESULTS CONFIRMED BY MANUAL DILUTION        IF HEPARIN RESULTS ARE BELOW EXPECTED VALUES, AND PATIENT DOSAGE HAS BEEN CONFIRMED, SUGGEST FOLLOW UP TESTING OF ANTITHROMBIN III LEVELS.   CBG monitoring, ED     Status: Abnormal   Collection Time: 06/11/16 11:31 PM  Result Value Ref Range   Glucose-Capillary 126 (H) 65 - 99 mg/dL  MRSA PCR Screening     Status: None   Collection Time: 06/12/16 12:43 AM  Result Value Ref Range   MRSA by PCR NEGATIVE NEGATIVE    Comment:        The GeneXpert MRSA Assay (FDA approved for NASAL specimens only), is one component of a comprehensive MRSA colonization surveillance program. It is not intended to diagnose MRSA infection nor to guide or monitor treatment for MRSA infections.   Glucose, capillary     Status: Abnormal   Collection Time: 06/12/16 12:59 AM  Result Value Ref Range   Glucose-Capillary 127 (H) 65 - 99 mg/dL  Basic metabolic panel     Status: Abnormal   Collection Time: 06/12/16  3:13 AM  Result Value Ref Range   Sodium 142 135 - 145 mmol/L   Potassium 3.9 3.5 - 5.1 mmol/L   Chloride 112 (H) 101 - 111 mmol/L   CO2 25 22 - 32 mmol/L   Glucose, Bld 129 (H) 65 - 99 mg/dL   BUN 17 6 - 20 mg/dL   Creatinine, Ser 0.59 0.44 - 1.00 mg/dL   Calcium 8.1 (L) 8.9 - 10.3 mg/dL   GFR calc non Af Amer >60 >60 mL/min   GFR calc Af Amer >60 >60 mL/min    Comment: (NOTE) The eGFR has been calculated using the CKD EPI equation. This calculation has not been validated in all clinical situations. eGFR's persistently <60 mL/min signify possible Chronic Kidney Disease.    Anion gap 5 5 - 15  CBC      Status: None   Collection Time: 06/12/16  3:13 AM  Result Value Ref Range   WBC 9.3 4.0 - 10.5 K/uL   RBC 3.99 3.87 - 5.11 MIL/uL   Hemoglobin 12.3 12.0 - 15.0 g/dL   HCT 36.4 36.0 - 46.0 %   MCV 91.2 78.0 - 100.0 fL   MCH 30.8 26.0 - 34.0 pg   MCHC 33.8 30.0 - 36.0 g/dL   RDW 12.9 11.5 - 15.5 %   Platelets 222 150 - 400 K/uL  Glucose, capillary     Status: Abnormal   Collection Time: 06/12/16  3:32 AM  Result Value Ref Range   Glucose-Capillary 119 (H) 65 - 99 mg/dL  APTT     Status: Abnormal   Collection Time: 06/12/16  6:47 AM  Result Value Ref Range   aPTT 59 (H) 24 - 36 seconds    Comment:  IF BASELINE aPTT IS ELEVATED, SUGGEST PATIENT RISK ASSESSMENT BE USED TO DETERMINE APPROPRIATE ANTICOAGULANT THERAPY.   Heparin level (unfractionated)     Status: Abnormal   Collection Time: 06/12/16  6:47 AM  Result Value Ref Range   Heparin Unfractionated 0.84 (H) 0.30 - 0.70 IU/mL    Comment:        IF HEPARIN RESULTS ARE BELOW EXPECTED VALUES, AND PATIENT DOSAGE HAS BEEN CONFIRMED, SUGGEST FOLLOW UP TESTING OF ANTITHROMBIN III LEVELS.   Magnesium     Status: None   Collection Time: 06/12/16  6:47 AM  Result Value Ref Range   Magnesium 2.2 1.7 - 2.4 mg/dL  Glucose, capillary     Status: Abnormal   Collection Time: 06/12/16  7:43 AM  Result Value Ref Range   Glucose-Capillary 125 (H) 65 - 99 mg/dL   Comment 1 Notify RN    Comment 2 Document in Chart     Current Facility-Administered Medications  Medication Dose Route Frequency Provider Last Rate Last Dose  . 0.9 %  sodium chloride infusion   Intravenous Continuous Rise Patience, MD 100 mL/hr at 06/12/16 0018    . acetaminophen (TYLENOL) tablet 650 mg  650 mg Oral Q6H PRN Rise Patience, MD   650 mg at 06/12/16 4010   Or  . acetaminophen (TYLENOL) suppository 650 mg  650 mg Rectal Q6H PRN Rise Patience, MD      . heparin ADULT infusion 100 units/mL (25000 units/257m sodium chloride 0.45%)  1,600  Units/hr Intravenous Continuous JAdrian Saran RPH 16 mL/hr at 06/12/16 0910 1,600 Units/hr at 06/12/16 0910  . insulin aspart (novoLOG) injection 0-9 Units  0-9 Units Subcutaneous Q4H CCaren Griffins MD   1 Units at 06/12/16 0749  . LORazepam (ATIVAN) injection 0.5 mg  0.5 mg Intravenous Q4H PRN Costin MKarlyne Greenspan MD        Musculoskeletal: Strength & Muscle Tone: decreased Gait & Station: in bed Patient leans: Backward  Psychiatric Specialty Exam: Physical Exam  ROS  Blood pressure 96/71, pulse 86, temperature 97.4 F (36.3 C), temperature source Oral, resp. rate 15, height '5\' 8"'$  (1.727 m), weight 112 kg (247 lb), SpO2 97 %.Body mass index is 37.56 kg/m.  General Appearance: Casual and Fairly Groomed  Eye Contact:  Minimal  Speech:  Clear and Coherent and Slow  Volume:  Decreased  Mood:  Depressed  Affect:  Appropriate, Congruent and Depressed  Thought Process:  Goal Directed, Linear and Descriptions of Associations: Loose  Orientation:  Full (Time, Place, and Person)  Thought Content:  Focused on wanting to sleep  Suicidal Thoughts:  Yes.  with intent/plan  Homicidal Thoughts:  No  Memory:  Immediate;   Fair Recent;   Fair Remote;   Fair  Judgement:  Fair  Insight:  Fair  Psychomotor Activity:  Normal  Concentration:  Concentration: Fair and Attention Span: Fair  Recall:  [employer] Corporationof Knowledge:  Fair  Language:  Fair  Akathisia:  No  Handed:    AIMS (if indicated):     Assets:  Communication Skills Desire for Improvement Resilience Social Support  ADL's:  Intact  Cognition:  WNL  Sleep:      Treatment Plan Summary: Acute encephalopathy via suicidal overdose, ingestion of unknown medications, unstable, warrants inpatient psychiatric hospitalization for safety and stabilization once medically cleared  Please have your hospital SW to reach out to family for collateral.   Disposition: Recommend psychiatric Inpatient admission when medically  cleared.  Benjamine Mola, FNP 06/12/2016 11:01 AM

## 2016-06-13 LAB — COMPREHENSIVE METABOLIC PANEL
ALT: 43 U/L (ref 14–54)
AST: 39 U/L (ref 15–41)
Albumin: 3.7 g/dL (ref 3.5–5.0)
Alkaline Phosphatase: 41 U/L (ref 38–126)
Anion gap: 9 (ref 5–15)
BUN: 7 mg/dL (ref 6–20)
CO2: 24 mmol/L (ref 22–32)
Calcium: 8.4 mg/dL — ABNORMAL LOW (ref 8.9–10.3)
Chloride: 109 mmol/L (ref 101–111)
Creatinine, Ser: 0.56 mg/dL (ref 0.44–1.00)
GFR calc Af Amer: >60 mL/min (ref >60)
GFR calc non Af Amer: >60 mL/min (ref >60)
Glucose, Bld: 107 mg/dL — ABNORMAL HIGH (ref 65–99)
Potassium: 3.7 mmol/L (ref 3.5–5.1)
Sodium: 142 mmol/L (ref 135–145)
Total Bilirubin: 0.9 mg/dL (ref 0.3–1.2)
Total Protein: 5.9 g/dL — ABNORMAL LOW (ref 6.5–8.1)

## 2016-06-13 LAB — HEPARIN LEVEL (UNFRACTIONATED): Heparin Unfractionated: 0.48 IU/mL (ref 0.30–0.70)

## 2016-06-13 LAB — CBC
HCT: 37.1 % (ref 36.0–46.0)
Hemoglobin: 12.7 g/dL (ref 12.0–15.0)
MCH: 30.8 pg (ref 26.0–34.0)
MCHC: 34.2 g/dL (ref 30.0–36.0)
MCV: 89.8 fL (ref 78.0–100.0)
Platelets: 207 10*3/uL (ref 150–400)
RBC: 4.13 MIL/uL (ref 3.87–5.11)
RDW: 12.6 % (ref 11.5–15.5)
WBC: 6.7 10*3/uL (ref 4.0–10.5)

## 2016-06-13 LAB — GLUCOSE, CAPILLARY
Glucose-Capillary: 103 mg/dL — ABNORMAL HIGH (ref 65–99)
Glucose-Capillary: 103 mg/dL — ABNORMAL HIGH (ref 65–99)
Glucose-Capillary: 110 mg/dL — ABNORMAL HIGH (ref 65–99)
Glucose-Capillary: 94 mg/dL (ref 65–99)
Glucose-Capillary: 116 mg/dL — ABNORMAL HIGH (ref 65–99)

## 2016-06-13 LAB — APTT: aPTT: 66 seconds — ABNORMAL HIGH (ref 24–36)

## 2016-06-13 LAB — RAPID STREP SCREEN (MED CTR MEBANE ONLY): Streptococcus, Group A Screen (Direct): NEGATIVE

## 2016-06-13 MED ORDER — METHOCARBAMOL 500 MG PO TABS
500.0000 mg | ORAL_TABLET | Freq: Three times a day (TID) | ORAL | Status: DC | PRN
  Administered 2016-06-13: 500 mg via ORAL
  Filled 2016-06-13: qty 1

## 2016-06-13 MED ORDER — GABAPENTIN 300 MG PO CAPS
600.0000 mg | ORAL_CAPSULE | Freq: Three times a day (TID) | ORAL | Status: DC
  Administered 2016-06-13 – 2016-06-14 (×2): 600 mg via ORAL
  Filled 2016-06-13 (×2): qty 2

## 2016-06-13 MED ORDER — ESCITALOPRAM OXALATE 10 MG PO TABS
10.0000 mg | ORAL_TABLET | Freq: Every day | ORAL | Status: DC
  Administered 2016-06-13: 10 mg via ORAL
  Filled 2016-06-13: qty 1

## 2016-06-13 MED ORDER — VITAMIN D3 25 MCG (1000 UNIT) PO TABS
10000.0000 [IU] | ORAL_TABLET | Freq: Every day | ORAL | Status: DC
  Administered 2016-06-14: 10000 [IU] via ORAL
  Filled 2016-06-13: qty 10

## 2016-06-13 MED ORDER — LORAZEPAM 2 MG/ML IJ SOLN
1.0000 mg | Freq: Once | INTRAMUSCULAR | Status: AC
  Administered 2016-06-13: 1 mg via INTRAVENOUS
  Filled 2016-06-13: qty 1

## 2016-06-13 MED ORDER — BUSPIRONE HCL 5 MG PO TABS
30.0000 mg | ORAL_TABLET | Freq: Two times a day (BID) | ORAL | Status: DC
  Administered 2016-06-13 – 2016-06-14 (×2): 30 mg via ORAL
  Filled 2016-06-13 (×2): qty 6

## 2016-06-13 MED ORDER — RIVAROXABAN 20 MG PO TABS
20.0000 mg | ORAL_TABLET | Freq: Every day | ORAL | Status: DC
  Administered 2016-06-13: 20 mg via ORAL
  Filled 2016-06-13: qty 1

## 2016-06-13 NOTE — Progress Notes (Signed)
CSW met with patient and spouse at bedside, explain role and reason for intervention. Patient very guarded. Patient spouse reports they were waiting to see psychiatrist for two days. CSW explained that the patient was seen by psychiatrist on 4/3 and was recommended inpatient hospitalization. Patient reports she does not recall seeing a psychiatrist. Spouse agreed with patient and stated CSW was "making it up." Patient was not aware of she was under IVC or attempted to leave the hospital last nigh. The spouse helped to explain to the patient she tried to leave.  CSW explained inpatient psychiatric process. Patient upset about recommendation and reports she is ready to go home.Patient spouse agreeable to recommendations and attempted to provide emotional support to patient.  CSW inform physician and psychiatrist on call today about family concerns.   CSW will continue to assist with placement   Kathrin Greathouse, Latanya Presser, MSW Clinical Social Worker [address] [place] [phone] 06/13/2016  5:05 PM

## 2016-06-13 NOTE — Progress Notes (Addendum)
PROGRESS NOTE  Deborah Soto WUJ:[id number] DOB: [birth year] DOA: 06/11/2016 PCP: Trena Platt, PA   LOS: 1 day   Brief Narrative: Deborah Soto is a 51 y.o. female with history of recently diagnosed diabetes mellitus type 2, sleep apnea, history of DVT and PE on xarelto and history of depression was brought to the ER after patient was found to be increasingly lethargic and drowsy by the husband. As per the husband patient has been depressed last 2 days and has been threatening suicide.   Assessment & Plan: Principal Problem:   Acute encephalopathy Active Problems:   PTE (pulmonary thromboembolism) (HCC)   Diabetes mellitus type 2 in obese (HCC)   Suicidal ideation   Intentional drug overdose (HCC)   Acute encephalopathy -Likely due to overdose of unknown substance -Discussed with husband at bedside, he found a text from her directed to him saying something along the lines of "goodbye", and patient repeated to the nursing staff last night that she wants to go home and kill herself -Patient appears alert this morning, does not wish to talk to me further about what happened, but appears stable to transfer out of stepdown  Depression/suicidal ideation -Psychiatry consulted -Patient was just started on Seroquel she took only 1 dose, this is confirmed by her husband.  He does however states that she is having clonazepam at home, and he thinks she might have overdosed on this -She is much improved this morning, more alert  History of PE and DVT -We will transition back to her home Xarelto, stop heparin infusion  Recently diagnosed type 2 diabetes -Keep on sliding scale, CBGs are well controlled  Sleep apnea -Nightly CPAP    DVT prophylaxis: heparin infusion Code Status: Full code Family Communication: d/w husband at bedside Disposition Plan: Transfer to MedSurg, patient medically ready for inpatient psych transfer as early as tomorrow when bed available  Consultants:    Psychiatry   Procedures:   None  Antimicrobials:  None    Subjective: -Appears alert this morning, keep her eyes closed and minimally interact with me.  Denies any chest pain or shortness of breath.  Objective: Vitals:   06/13/16 0400 06/13/16 0600 06/13/16 0745 06/13/16 0800  BP: (!) 156/95 (!) 139/97  (!) 156/82  Pulse: (!) 101 (!) 105  100  Resp: Temp:   97.8 F (36.6 C)   TempSrc:   Oral   SpO2: 94% 95%  94%  Weight:      Height:        Intake/Output Summary (Last 24 hours) at 06/13/16 1104 Last data filed at 06/13/16 0800  Gross per 24 hour  Intake          2000.54 ml  Output              300 ml  Net          1700.54 ml   Filed Weights   06/11/16 1846  Weight: 112 kg (247 lb)    Examination: Constitutional: NAD Vitals:   06/13/16 0400 06/13/16 0600 06/13/16 0745 06/13/16 0800  BP: (!) 156/95 (!) 139/97  (!) 156/82  Pulse: (!) 101 (!) 105  100  Resp: Temp:   97.8 F (36.6 C)   TempSrc:   Oral   SpO2: 94% 95%  94%  Weight:      Height:       Eyes: PERRL, lids and conjunctivae normal Respiratory: clear to auscultation bilaterally, no wheezing, no  crackles.  Cardiovascular: Regular rate and rhythm, no murmurs / rubs / gallops. No LE edema. 2+ pedal pulses. Abdomen: no tenderness. Bowel sounds positive.  Musculoskeletal: no clubbing / cyanosis.  Skin: no rashes, lesions, ulcers. No induration Neurologic: non focal   Data Reviewed: I have personally reviewed following labs and imaging studies  CBC:  Recent Labs Lab 06/11/16 1822 06/12/16 0313 06/13/16 0348  WBC 9.0 9.3 6.7  HGB 14.0 12.3 12.7  HCT 41.0 36.4 37.1  MCV 88.0 91.2 89.8  PLT 266 222 207   Basic Metabolic Panel:  Recent Labs Lab 06/08/16 0940 06/11/16 1822 06/11/16 1926 06/12/16 0313 06/12/16 0647 06/13/16 0348  NA 141 141  --  142  --  142  K 4.2 4.4  --  3.9  --  3.7  CL 101 109  --  112*  --  109  CO2 21 24  --  25  --  24  GLUCOSE 163*  167*  --  129*  --  107*  BUN 16 21*  --  17  --  7  CREATININE 0.60 1.12*  --  0.59  --  0.56  CALCIUM 9.9 8.9  --  8.1*  --  8.4*  MG  --   --  0.8*  --  2.2  --    GFR: Estimated Creatinine Clearance: 112.8 mL/min (by C-G formula based on SCr of 0.56 mg/dL). Liver Function Tests:  Recent Labs Lab 06/08/16 0940 06/11/16 1822 06/13/16 0348  AST 36 46* 39  ALT 45* 50 43  ALKPHOS 56 44 41  BILITOT 0.6 1.0 0.9  PROT 6.5 6.3* 5.9*  ALBUMIN 4.6 4.0 3.7   No results for input(s): LIPASE, AMYLASE in the last 168 hours.  Recent Labs Lab 06/11/16 2306  AMMONIA 25   Coagulation Profile:  Recent Labs Lab 06/11/16 1850  INR 1.52   Cardiac Enzymes: No results for input(s): CKTOTAL, CKMB, CKMBINDEX, TROPONINI in the last 168 hours. BNP (last 3 results) No results for input(s): PROBNP in the last 8760 hours. HbA1C: No results for input(s): HGBA1C in the last 72 hours. CBG:  Recent Labs Lab 06/12/16 1603 06/12/16 1932 06/12/16 2359 06/13/16 0336 06/13/16 0744  GLUCAP 114* 127* 109* 103* 103*   Lipid Profile: No results for input(s): CHOL, HDL, LDLCALC, TRIG, CHOLHDL, LDLDIRECT in the last 72 hours. Thyroid Function Tests:  Recent Labs  06/11/16 1919  TSH 0.676   Anemia Panel: No results for input(s): VITAMINB12, FOLATE, FERRITIN, TIBC, IRON, RETICCTPCT in the last 72 hours. Urine analysis:    Component Value Date/Time   COLORURINE AMBER (A) 06/11/2016 1919   APPEARANCEUR HAZY (A) 06/11/2016 1919   LABSPEC 1.023 06/11/2016 1919   PHURINE 5.0 06/11/2016 1919   GLUCOSEU 50 (A) 06/11/2016 1919   HGBUR MODERATE (A) 06/11/2016 1919   BILIRUBINUR SMALL (A) 06/11/2016 1919   BILIRUBINUR small (A) 06/08/2016 0922   BILIRUBINUR neg 07/29/2014 1536   KETONESUR 20 (A) 06/11/2016 1919   PROTEINUR 30 (A) 06/11/2016 1919   UROBILINOGEN 0.2 06/08/2016 0922   NITRITE NEGATIVE 06/11/2016 1919   LEUKOCYTESUR NEGATIVE 06/11/2016 1919   Sepsis Labs: Invalid input(s):  PROCALCITONIN, LACTICIDVEN  Recent Results (from the past 240 hour(s))  Urine culture     Status: None   Collection Time: 06/08/16  9:55 AM  Result Value Ref Range Status   Urine Culture, Routine Final report  Final   Urine Culture result 1 No growth  Final  MRSA PCR Screening  Status: None   Collection Time: 06/12/16 12:43 AM  Result Value Ref Range Status   MRSA by PCR NEGATIVE NEGATIVE Final    Comment:        The GeneXpert MRSA Assay (FDA approved for NASAL specimens only), is one component of a comprehensive MRSA colonization surveillance program. It is not intended to diagnose MRSA infection nor to guide or monitor treatment for MRSA infections.       Radiology Studies: Dg Chest 2 View  Result Date: 06/11/2016 CLINICAL DATA:  Overdose on unknown substance. EXAM: CHEST  2 VIEW COMPARISON:  March 9th 2018 FINDINGS: The heart size and mediastinal contours are within normal limits. Both lungs are clear. The visualized skeletal structures are unremarkable. IMPRESSION: No active cardiopulmonary disease. Electronically Signed   By: Sherian Rein M.D.   On: 06/11/2016 20:41   Ct Head Wo Contrast  Result Date: 06/11/2016 CLINICAL DATA:  Overdose on unknown substance. EXAM: CT HEAD WITHOUT CONTRAST TECHNIQUE: Contiguous axial images were obtained from the base of the skull through the vertex without intravenous contrast. COMPARISON:  June 14, 2007 FINDINGS: Brain: No evidence of acute infarction, hemorrhage, hydrocephalus, extra-axial collection or mass lesion/mass effect. Vascular: No hyperdense vessel or unexpected calcification. Skull: Normal. Negative for fracture or focal lesion. Sinuses/Orbits: No acute finding. Other: None. IMPRESSION: No focal acute intracranial abnormality identified. Electronically Signed   By: Sherian Rein M.D.   On: 06/11/2016 19:41     Scheduled Meds: . chlorhexidine  15 mL Mouth Rinse BID  . insulin aspart  0-9 Units Subcutaneous Q4H  . mouth rinse   15 mL Mouth Rinse q12n4p  . rivaroxaban  20 mg Oral Daily   Continuous Infusions:   Pamella Pert, MD, PhD [facility] Pager [phone]  If 7PM-7AM, please contact night-coverage [email or web address] Password [email or web address] 06/13/2016, 11:04 AM

## 2016-06-13 NOTE — Progress Notes (Signed)
ANTICOAGULATION CONSULT NOTE - follow up  Pharmacy Consult to discontinue IV Heparin and restart Xarelto (pt on PTA) Indication: pulmonary embolus  Allergies  Allergen Reactions  . Tramadol Other (See Comments)    Tingling all over     Patient Measurements: Height:  (172.7 cm) Weight: 247 lb (112 kg) IBW/kg (Calculated) : 63.9 Heparin Dosing Weight: 78 kg  Vital Signs: Temp: 98.1 F (36.7 C) (04/04 0337) Temp Source: Oral (04/04 0337) BP: 139/97 (04/04 0600) Pulse Rate: 105 (04/04 0600)  Labs:  Recent Labs  06/11/16 1822 06/11/16 1850  06/12/16 0313 06/12/16 0647 06/12/16 1458 06/12/16 2013 06/13/16 0348  HGB 14.0  --   --  12.3  --   --   --  12.7  HCT 41.0  --   --  36.4  --   --   --  37.1  PLT 266  --   --  222  --   --   --  207  APTT  --   --   < >  --  59* 73* 73* 66*  LABPROT  --  18.4*  --   --   --   --   --   --   INR  --  1.52  --   --   --   --   --   --   HEPARINUNFRC  --   --   < >  --  0.84* 0.83*  --  0.48  CREATININE 1.12*  --   --  0.59  --   --   --   --   < > = values in this interval not displayed.  Estimated Creatinine Clearance: 112.8 mL/min (by C-G formula based on SCr of 0.59 mg/dL).   Medical History: Past Medical History:  Diagnosis Date  . Asthma   . Depression   . Diabetes mellitus type 2 in obese (HCC) 06/11/2016  . Embolism - blood clot January 1997 & January 2017   Also had LLE DVT in 1997    Medications:  Scheduled:  . chlorhexidine  15 mL Mouth Rinse BID  . insulin aspart  0-9 Units Subcutaneous Q4H  . mouth rinse  15 mL Mouth Rinse q12n4p   Infusions:  . heparin 1,600 Units/hr (06/13/16 0700)    Assessment: 50 yoF s/p drug OD of unknown medications with altered mental status on chronic xarelto for hx of DVT/PE.  IV heparin per Rx.  LD xarelto 4/1 at 2030 4/2 baseline coags: INR=1.52, aptt=28,HL=1.02  Today, 06/13/16  Patient improving, to discontinue IV heparin and restart Xarelto  Renal function stable  and CBC stable  No reported bleeding  Goal of Therapy:  aPTT= 66-102 Heparin level 0.3-0.7 units/ml Monitor platelets by anticoagulation protocol: Yes   Plan:  1) Discontinue IV heparin now 2) Restart Xarelto as patient was on prior to admission -  once daily 3) Monitor renal function and CBC   Hessie Knows, PharmD, BCPS Pager [phone] 06/13/2016 7:26 AM

## 2016-06-13 NOTE — Progress Notes (Signed)
ANTICOAGULATION CONSULT NOTE - follow up  Pharmacy Consult for IV Heparin (on Xarelto PTA) Indication: pulmonary embolus  Allergies  Allergen Reactions  . Tramadol Other (See Comments)    Tingling all over     Patient Measurements: Height:  (172.7 cm) Weight: 247 lb (112 kg) IBW/kg (Calculated) : 63.9 Heparin Dosing Weight: 78 kg  Vital Signs: Temp: 98.1 F (36.7 C) (04/04 0337) Temp Source: Oral (04/04 0337) BP: 156/95 (04/04 0400) Pulse Rate: 101 (04/04 0400)  Labs:  Recent Labs  06/11/16 1822 06/11/16 1850  06/12/16 0313 06/12/16 0647 06/12/16 1458 06/12/16 2013 06/13/16 0348  HGB 14.0  --   --  12.3  --   --   --  12.7  HCT 41.0  --   --  36.4  --   --   --  37.1  PLT 266  --   --  222  --   --   --  207  APTT  --   --   < >  --  59* 73* 73* 66*  LABPROT  --  18.4*  --   --   --   --   --   --   INR  --  1.52  --   --   --   --   --   --   HEPARINUNFRC  --   --   < >  --  0.84* 0.83*  --  0.48  CREATININE 1.12*  --   --  0.59  --   --   --   --   < > = values in this interval not displayed.  Estimated Creatinine Clearance: 112.8 mL/min (by C-G formula based on SCr of 0.59 mg/dL).   Medical History: Past Medical History:  Diagnosis Date  . Asthma   . Depression   . Diabetes mellitus type 2 in obese (HCC) 06/11/2016  . Embolism - blood clot January 1997 & January 2017   Also had LLE DVT in 1997    Medications:  Scheduled:  . chlorhexidine  15 mL Mouth Rinse BID  . insulin aspart  0-9 Units Subcutaneous Q4H  . mouth rinse  15 mL Mouth Rinse q12n4p   Infusions:  . heparin 1,600 Units/hr (06/13/16 0400)    Assessment: 50 yoF s/p drug OD of unknown medications with altered mental status on chronic xarelto for hx of DVT/PE.  IV heparin per Rx.  LD xarelto 4/1 at 2030 4/2 baseline coags: INR=1.52, aptt=28,HL=1.02   4/3  APTT = 73 sec after heparin rate increase to 1600 units/hr   HL remains supratherapeutic due to Xarelto  CBC stable  No  reported bleeding  Will use aPTT until they correlate with HL d/t recent xarelto use  Await orders to resume Xarelto once patient's acute encephalopathy improves and can restart orals Today, 4/4  0348 aptt=66 and HL=0.48, both labs are in therapeutic range and correlating, will stop using aptts and only use HLs  RN did report IV site in hand bleeding earlier tonight (not where heparin infusing)  IV site removed and bleeding resolved  Goal of Therapy:  aPTT= 66-102 Heparin level 0.3-0.7 units/ml Monitor platelets by anticoagulation protocol: Yes   Plan:  1) Continue IV heparin rate @ 1600 units/hr 3) Daily HL   [name] 06/13/2016 4:33 AM

## 2016-06-13 NOTE — Progress Notes (Signed)
Pt had family member bring in her home cpap unit for use tonight.  Pt stated that the hospital-provided machine was too uncomfortable.  No frays on cord or obvious defects noted with home machine.  Sterile water added to humidifier.  This RT assisted pt in placing her on her home unit.  Pt appears comfortable.  RT available as needed.

## 2016-06-14 ENCOUNTER — Encounter (HOSPITAL_COMMUNITY): Payer: Self-pay | Admitting: *Deleted

## 2016-06-14 ENCOUNTER — Inpatient Hospital Stay (HOSPITAL_COMMUNITY)
Admission: AD | Admit: 2016-06-14 | Discharge: 2016-06-21 | DRG: 885 | Disposition: A | Discharge status: Home / Self Care | Payer: 59 | Attending: Psychiatry | Admitting: Psychiatry

## 2016-06-14 DIAGNOSIS — E669 Obesity, unspecified: Secondary | ICD-10-CM

## 2016-06-14 DIAGNOSIS — Z7901 Long term (current) use of anticoagulants: Secondary | ICD-10-CM

## 2016-06-14 DIAGNOSIS — Z7984 Long term (current) use of oral hypoglycemic drugs: Secondary | ICD-10-CM

## 2016-06-14 DIAGNOSIS — F332 Major depressive disorder, recurrent severe without psychotic features: Secondary | ICD-10-CM

## 2016-06-14 DIAGNOSIS — T424X2A Poisoning by benzodiazepines, intentional self-harm, initial encounter: Secondary | ICD-10-CM | POA: Diagnosis not present

## 2016-06-14 DIAGNOSIS — I10 Essential (primary) hypertension: Secondary | ICD-10-CM | POA: Diagnosis present

## 2016-06-14 DIAGNOSIS — Z86711 Personal history of pulmonary embolism: Secondary | ICD-10-CM | POA: Diagnosis not present

## 2016-06-14 DIAGNOSIS — E1169 Type 2 diabetes mellitus with other specified complication: Secondary | ICD-10-CM

## 2016-06-14 DIAGNOSIS — Z86718 Personal history of other venous thrombosis and embolism: Secondary | ICD-10-CM | POA: Diagnosis not present

## 2016-06-14 DIAGNOSIS — F319 Bipolar disorder, unspecified: Secondary | ICD-10-CM | POA: Diagnosis not present

## 2016-06-14 DIAGNOSIS — T1491XA Suicide attempt, initial encounter: Secondary | ICD-10-CM | POA: Diagnosis not present

## 2016-06-14 DIAGNOSIS — Z87891 Personal history of nicotine dependence: Secondary | ICD-10-CM | POA: Diagnosis not present

## 2016-06-14 DIAGNOSIS — E119 Type 2 diabetes mellitus without complications: Secondary | ICD-10-CM | POA: Diagnosis present

## 2016-06-14 DIAGNOSIS — Z915 Personal history of self-harm: Secondary | ICD-10-CM | POA: Diagnosis not present

## 2016-06-14 DIAGNOSIS — G9340 Encephalopathy, unspecified: Secondary | ICD-10-CM | POA: Diagnosis not present

## 2016-06-14 DIAGNOSIS — Z79899 Other long term (current) drug therapy: Secondary | ICD-10-CM

## 2016-06-14 DIAGNOSIS — G4730 Sleep apnea, unspecified: Secondary | ICD-10-CM | POA: Diagnosis present

## 2016-06-14 DIAGNOSIS — Z810 Family history of intellectual disabilities: Secondary | ICD-10-CM | POA: Diagnosis not present

## 2016-06-14 DIAGNOSIS — T50902A Poisoning by unspecified drugs, medicaments and biological substances, intentional self-harm, initial encounter: Secondary | ICD-10-CM | POA: Diagnosis present

## 2016-06-14 HISTORY — DX: Anxiety disorder, unspecified: F41.9

## 2016-06-14 HISTORY — DX: Essential (primary) hypertension: I10

## 2016-06-14 HISTORY — DX: Sleep apnea, unspecified: G47.30

## 2016-06-14 LAB — CBC
HCT: 38.6 % (ref 36.0–46.0)
Hemoglobin: 13.2 g/dL (ref 12.0–15.0)
MCH: 30.8 pg (ref 26.0–34.0)
MCHC: 34.2 g/dL (ref 30.0–36.0)
MCV: 90.2 fL (ref 78.0–100.0)
Platelets: 223 10*3/uL (ref 150–400)
RBC: 4.28 MIL/uL (ref 3.87–5.11)
RDW: 12.8 % (ref 11.5–15.5)
WBC: 6.4 10*3/uL (ref 4.0–10.5)

## 2016-06-14 LAB — GLUCOSE, CAPILLARY
Glucose-Capillary: 108 mg/dL — ABNORMAL HIGH (ref 65–99)
Glucose-Capillary: 111 mg/dL — ABNORMAL HIGH (ref 65–99)
Glucose-Capillary: 127 mg/dL — ABNORMAL HIGH (ref 65–99)
Glucose-Capillary: 192 mg/dL — ABNORMAL HIGH (ref 65–99)
Glucose-Capillary: 197 mg/dL — ABNORMAL HIGH (ref 65–99)

## 2016-06-14 MED ORDER — TRAZODONE HCL 50 MG PO TABS
50.0000 mg | ORAL_TABLET | Freq: Every evening | ORAL | Status: DC | PRN
  Administered 2016-06-14 – 2016-06-20 (×7): 50 mg via ORAL
  Filled 2016-06-14 (×7): qty 1

## 2016-06-14 MED ORDER — GABAPENTIN 300 MG PO CAPS
600.0000 mg | ORAL_CAPSULE | Freq: Three times a day (TID) | ORAL | Status: DC
  Administered 2016-06-14 – 2016-06-15 (×3): 600 mg via ORAL
  Filled 2016-06-14 (×6): qty 2

## 2016-06-14 MED ORDER — MENTHOL 3 MG MT LOZG: 1.0000 | LOZENGE | OROMUCOSAL | Status: DC | PRN

## 2016-06-14 MED ORDER — RIVAROXABAN 20 MG PO TABS
20.0000 mg | ORAL_TABLET | Freq: Every day | ORAL | Status: DC
  Administered 2016-06-14: 20 mg via ORAL
  Filled 2016-06-14: qty 1

## 2016-06-14 MED ORDER — MAGNESIUM HYDROXIDE 400 MG/5ML PO SUSP: 30.0000 mL | Freq: Every day | ORAL | Status: DC | PRN

## 2016-06-14 MED ORDER — METFORMIN HCL 500 MG PO TABS
500.0000 mg | ORAL_TABLET | Freq: Two times a day (BID) | ORAL | Status: DC
  Administered 2016-06-15: 500 mg via ORAL
  Filled 2016-06-14 (×3): qty 1

## 2016-06-14 MED ORDER — ALUM & MAG HYDROXIDE-SIMETH 200-200-20 MG/5ML PO SUSP: 30.0000 mL | ORAL | Status: DC | PRN

## 2016-06-14 MED ORDER — BUSPIRONE HCL 15 MG PO TABS
30.0000 mg | ORAL_TABLET | Freq: Two times a day (BID) | ORAL | Status: DC
  Administered 2016-06-14 – 2016-06-16 (×4): 30 mg via ORAL
  Filled 2016-06-14 (×8): qty 2
  Filled 2016-06-14: qty 6

## 2016-06-14 MED ORDER — ACETAMINOPHEN 325 MG PO TABS
650.0000 mg | ORAL_TABLET | Freq: Four times a day (QID) | ORAL | Status: DC | PRN
  Administered 2016-06-19: 650 mg via ORAL
  Filled 2016-06-14: qty 2

## 2016-06-14 MED ORDER — RIVAROXABAN 20 MG PO TABS
20.0000 mg | ORAL_TABLET | Freq: Every day | ORAL | Status: DC
  Administered 2016-06-15 – 2016-06-20 (×6): 20 mg via ORAL
  Filled 2016-06-14 (×8): qty 1

## 2016-06-14 MED ORDER — HYDROXYZINE HCL 25 MG PO TABS
25.0000 mg | ORAL_TABLET | Freq: Four times a day (QID) | ORAL | Status: DC | PRN
  Administered 2016-06-14 – 2016-06-20 (×6): 25 mg via ORAL
  Filled 2016-06-14 (×6): qty 1

## 2016-06-14 MED ORDER — ESCITALOPRAM OXALATE 10 MG PO TABS
10.0000 mg | ORAL_TABLET | Freq: Every day | ORAL | Status: DC
  Administered 2016-06-14: 10 mg via ORAL
  Filled 2016-06-14 (×4): qty 1

## 2016-06-14 NOTE — Progress Notes (Signed)
Report called to Joe at [facility].

## 2016-06-14 NOTE — Consult Note (Signed)
[facility] Face-to-Face Psychiatry Consult   Reason for Consult:  Suicide attempt, overdose Referring Physician:  TRH Patient Identification: Deborah Soto MRN:  [id number] Principal Diagnosis: Acute encephalopathy with suicidal overdose Diagnosis:   Patient Active Problem List   Diagnosis Date Noted  . Intentional drug overdose (HCC) [T50.902A]   . Acute encephalopathy [G93.40] 06/11/2016  . Diabetes mellitus type 2 in obese (HCC) [E11.69, E66.9] 06/11/2016  . Suicidal ideation [R45.851] 06/11/2016  . Positive ANA (antinuclear antibody) [R76.8] 04/02/2016  . Hyperparathyroidism (HCC) [E21.3] 01/09/2016  . Snoring [R06.83] 09/20/2015  . 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] 05/12/2015  . Hepatic steatosis [K76.0] 04/18/2015  . Left sided chest pain [R07.9]   . Dyspnea [R06.00]   . Achilles tendonitis [M76.60] 04/08/2012  . 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] 04/08/2012  . PTE (pulmonary thromboembolism) (HCC) [I26.99] 10/10/2011  . Preeclampsia [O14.90] 10/10/2011  . Diverticulitis [K57.92] 10/10/2011  . Bipolar affective disorder (HCC) [F31.9] 10/10/2011  . BMI 34.0-34.9,adult [Z68.34] 10/10/2011  . AR (allergic rhinitis) [J30.9] 10/10/2011    Total Time spent with patient: 40 minutes  Subjective:   Deborah Soto is a 51 y.o. female patient admitted with reports of intentional overdose in suicide attempt. Pt seen and chart reviewed. Pt is alert/oriented x4, calm, cooperative, and appropriate to situation. Pt denies homicidal ideation and psychosis and does not appear to be responding to internal stimuli. Pt was extremely somnolent during interview and was able to be woken up but had to be awoken 5 times to complete the assessment. Pt reports 2 serious overdose attempts before this one and reports "I don't even know what I took or what happened but I have been suicidal every day and very depressed for 15 years." Hospital SW will attempt to obtain collateral  from pt's family to attempt to ascertain what pills may have been ingested yet disposition will remain the same: inpatient psychiatric hospitalization.   HPI:  I have reviewed and concur with HPI elements below, modified as follows;  "Deborah Soto is a 51 y.o. female with history of recently diagnosed diabetes mellitus type 2, sleep apnea, history of DVT and PE on xarelto and history of depression was brought to the ER after patient was found to be increasingly lethargic and drowsy by the husband. As per the husband patient has been depressed last 2 days and has been threatening suicide. Patient had taken her home medications yesterday. This morning when husband was planning to give the medications to her she refused. Since patient was looking more lethargic husband plan to work at home. In the afternoon patient was found walking in the kitchen and later on was found to be on the bed lethargic. Later in the evening around 4-5 PM husband called EMS and patient was brought to the ER."  Pt spent the night int he hospital without incident yet has been lethargic and somnolent. Seen today as above on 06/12/16 for psychiatric evaluation. Staff report that pt has been cooperative. Safety sitter in place due to high risk.  Past Psychiatric History: depression, anxiety  06/11/2016:  Interval history: patient seen, chart reviewed and case discussed with patient, her husband and LCSW. Patient endorses symptoms of depression and status post suicide attempt with intentional overdose prior to admitted to [facility]. Patient seems to be acute encephalopathic and husband does not remember seeing NP who has initial evaluation and demanded reevaluation and wants to be admitted voluntarily instead of IVC which was initiated because she try to elope last night as per  her husband. She does not even remember where she was last night. She is more awake, alert, oriented this afternoon and continue to endorse suicide ideation and wished  she would have killed by now. She states that she has been suffering with depression over the years and in the past taken ECT treatments. Her husband stated that she has significant memory deficits and does not like to consent again. Discussed briefly about [facility] therapy if needed.  Risk to Self: Is patient at risk for suicide?: Yes Risk to Others:   Prior Inpatient Therapy:   Prior Outpatient Therapy:    Past Medical History:  Past Medical History:  Diagnosis Date  . Asthma   . Depression   . Diabetes mellitus type 2 in obese ([place]) 06/11/2016  . Embolism - blood clot January 1997 & January 2017   Also had LLE DVT in 1997    Past Surgical History:  Procedure Laterality Date  . NO PAST SURGERIES     Family History:  Family History  Problem Relation Age of Onset  . Dementia Father   . Heart disease Father   . Clotting disorder Mother   . Arthritis Mother   . Clotting disorder Sister   . Clotting disorder Maternal Grandmother   . Clotting disorder Maternal Aunt   . Rheumatologic disease Neg Hx   . Hyperparathyroidism Neg Hx    Family Psychiatric  History: denies Social History:  History  Alcohol Use  . 0.6 oz/week  . 1 Cans of beer per week    Comment: 1-2 times a week     History  Drug Use No    Social History   Social History  . Marital status: Married    Spouse name: N/A  . Number of children: Y  . Years of education: N/A   Occupational History  . admin assist    Social History Main Topics  . Smoking status: Former Smoker    Packs/day: 1.00    Years: 20.00    Quit date: 03/12/2004  . Smokeless tobacco: Never Used  . Alcohol use 0.6 oz/week    1 Cans of beer per week     Comment: 1-2 times a week  . Drug use: No  . Sexual activity: Not Asked   Other Topics Concern  . None   Social History Narrative   Originally from [place]. Always lived in [place]. 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 No international travel recently. Previously has been to [place] in  May 1996. No mold exposure recently. No bird exposure. Does have multiple pets.    Additional Social History:    Allergies:   Allergies  Allergen Reactions  . Tramadol Other (See Comments)    Tingling all over     Labs:  Results for orders placed or performed during the hospital encounter of 06/11/16 (from the past 48 hour(s))  Glucose, capillary     Status: Abnormal   Collection Time: 06/12/16 11:25 AM  Result Value Ref Range   Glucose-Capillary 118 (H) 65 - 99 mg/dL   Comment 1 Notify RN    Comment 2 Document in Chart   Cortisol     Status: None   Collection Time: 06/12/16 12:55 PM  Result Value Ref Range   Cortisol, Plasma 5.8 ug/dL    Comment: (NOTE) AM    6.7 - 22.6 ug/dL PM   <10.0       ug/dL Performed at [facility] [address], [place], [place]  [address]   APTT     Status: Abnormal   Collection Time: 06/12/16  2:58 PM  Result Value Ref Range   aPTT 73 (H) 24 - 36 seconds    Comment:        IF BASELINE aPTT IS ELEVATED, SUGGEST PATIENT RISK ASSESSMENT BE USED TO DETERMINE APPROPRIATE ANTICOAGULANT THERAPY.   Heparin level (unfractionated)     Status: Abnormal   Collection Time: 06/12/16  2:58 PM  Result Value Ref Range   Heparin Unfractionated 0.83 (H) 0.30 - 0.70 IU/mL    Comment:        IF HEPARIN RESULTS ARE BELOW EXPECTED VALUES, AND PATIENT DOSAGE HAS BEEN CONFIRMED, SUGGEST FOLLOW UP TESTING OF ANTITHROMBIN III LEVELS.   Glucose, capillary     Status: Abnormal   Collection Time: 06/12/16  4:03 PM  Result Value Ref Range   Glucose-Capillary 114 (H) 65 - 99 mg/dL  Glucose, capillary     Status: Abnormal   Collection Time: 06/12/16  7:32 PM  Result Value Ref Range   Glucose-Capillary 127 (H) 65 - 99 mg/dL  APTT     Status: Abnormal   Collection Time: 06/12/16  8:13 PM  Result Value Ref Range   aPTT 73 (H) 24 - 36 seconds    Comment:        IF BASELINE aPTT IS ELEVATED, SUGGEST PATIENT RISK ASSESSMENT BE USED TO DETERMINE  APPROPRIATE ANTICOAGULANT THERAPY.   Glucose, capillary     Status: Abnormal   Collection Time: 06/12/16 11:59 PM  Result Value Ref Range   Glucose-Capillary 109 (H) 65 - 99 mg/dL  Glucose, capillary     Status: Abnormal   Collection Time: 06/13/16  3:36 AM  Result Value Ref Range   Glucose-Capillary 103 (H) 65 - 99 mg/dL  CBC     Status: None   Collection Time: 06/13/16  3:48 AM  Result Value Ref Range   WBC 6.7 4.0 - 10.5 K/uL   RBC 4.13 3.87 - 5.11 MIL/uL   Hemoglobin 12.7 12.0 - 15.0 g/dL   HCT 37.1 36.0 - 46.0 %   MCV 89.8 78.0 - 100.0 fL   MCH 30.8 26.0 - 34.0 pg   MCHC 34.2 30.0 - 36.0 g/dL   RDW 12.6 11.5 - 15.5 %   Platelets 207 150 - 400 K/uL  APTT     Status: Abnormal   Collection Time: 06/13/16  3:48 AM  Result Value Ref Range   aPTT 66 (H) 24 - 36 seconds    Comment:        IF BASELINE aPTT IS ELEVATED, SUGGEST PATIENT RISK ASSESSMENT BE USED TO DETERMINE APPROPRIATE ANTICOAGULANT THERAPY.   Heparin level (unfractionated)     Status: None   Collection Time: 06/13/16  3:48 AM  Result Value Ref Range   Heparin Unfractionated 0.48 0.30 - 0.70 IU/mL    Comment:        IF HEPARIN RESULTS ARE BELOW EXPECTED VALUES, AND PATIENT DOSAGE HAS BEEN CONFIRMED, SUGGEST FOLLOW UP TESTING OF ANTITHROMBIN III LEVELS.   Comprehensive metabolic panel     Status: Abnormal   Collection Time: 06/13/16  3:48 AM  Result Value Ref Range   Sodium 142 135 - 145 mmol/L   Potassium 3.7 3.5 - 5.1 mmol/L   Chloride 109 101 - 111 mmol/L   CO2 24 22 - 32 mmol/L   Glucose, Bld 107 (H) 65 - 99 mg/dL   BUN 7 6 - 20 mg/dL   Creatinine,  Ser 0.56 0.44 - 1.00 mg/dL   Calcium 8.4 (L) 8.9 - 10.3 mg/dL   Total Protein 5.9 (L) 6.5 - 8.1 g/dL   Albumin 3.7 3.5 - 5.0 g/dL   AST 39 15 - 41 U/L   ALT 43 14 - 54 U/L   Alkaline Phosphatase 41 38 - 126 U/L   Total Bilirubin 0.9 0.3 - 1.2 mg/dL   GFR calc non Af Amer >60 >60 mL/min   GFR calc Af Amer >60 >60 mL/min    Comment: (NOTE) The  eGFR has been calculated using the CKD EPI equation. This calculation has not been validated in all clinical situations. eGFR's persistently <60 mL/min signify possible Chronic Kidney Disease.    Anion gap 9 5 - 15  Glucose, capillary     Status: Abnormal   Collection Time: 06/13/16  7:44 AM  Result Value Ref Range   Glucose-Capillary 103 (H) 65 - 99 mg/dL  Glucose, capillary     Status: Abnormal   Collection Time: 06/13/16 12:01 PM  Result Value Ref Range   Glucose-Capillary 110 (H) 65 - 99 mg/dL  Glucose, capillary     Status: None   Collection Time: 06/13/16  4:44 PM  Result Value Ref Range   Glucose-Capillary 94 65 - 99 mg/dL  Rapid strep screen (not at [facility])     Status: None   Collection Time: 06/13/16  5:18 PM  Result Value Ref Range   Streptococcus, Group A Screen (Direct) NEGATIVE NEGATIVE    Comment: (NOTE) A Rapid Antigen test may result negative if the antigen level in the sample is below the detection level of this test. The FDA has not cleared this test as a stand-alone test therefore the rapid antigen negative result has reflexed to a Group A Strep culture.   Culture, group A strep     Status: None (Preliminary result)   Collection Time: 06/13/16  5:18 PM  Result Value Ref Range   Specimen Description THROAT    Special Requests NONE Reflexed from [id number]    Culture      CULTURE REINCUBATED FOR BETTER GROWTH Performed at [facility], [place] [address] [place], [place] [address]    Report Status PENDING   Glucose, capillary     Status: Abnormal   Collection Time: 06/13/16  8:29 PM  Result Value Ref Range   Glucose-Capillary 116 (H) 65 - 99 mg/dL  Glucose, capillary     Status: Abnormal   Collection Time: 06/14/16 12:04 AM  Result Value Ref Range   Glucose-Capillary 111 (H) 65 - 99 mg/dL  Glucose, capillary     Status: Abnormal   Collection Time: 06/14/16  3:37 AM  Result Value Ref Range   Glucose-Capillary 108 (H) 65 - 99 mg/dL  CBC     Status: None    Collection Time: 06/14/16  3:42 AM  Result Value Ref Range   WBC 6.4 4.0 - 10.5 K/uL   RBC 4.28 3.87 - 5.11 MIL/uL   Hemoglobin 13.2 12.0 - 15.0 g/dL   HCT 38.6 36.0 - 46.0 %   MCV 90.2 78.0 - 100.0 fL   MCH 30.8 26.0 - 34.0 pg   MCHC 34.2 30.0 - 36.0 g/dL   RDW 12.8 11.5 - 15.5 %   Platelets 223 150 - 400 K/uL  Glucose, capillary     Status: Abnormal   Collection Time: 06/14/16  8:57 AM  Result Value Ref Range   Glucose-Capillary 127 (H) 65 - 99 mg/dL  Current Facility-Administered Medications  Medication Dose Route Frequency Provider Last Rate Last Dose  . acetaminophen (TYLENOL) tablet 650 mg  650 mg Oral Q6H PRN Rise Patience, MD   650 mg at 06/13/16 1947   Or  . acetaminophen (TYLENOL) suppository 650 mg  650 mg Rectal Q6H PRN Rise Patience, MD      . busPIRone (BUSPAR) tablet 30 mg  30 mg Oral BID Caren Griffins, MD   30 mg at 06/14/16 1015  . chlorhexidine (PERIDEX) 0.12 % solution 15 mL  15 mL Mouth Rinse BID Costin Karlyne Greenspan, MD   15 mL at 06/14/16 1016  . cholecalciferol (VITAMIN D) tablet 10,000 Units  10,000 Units Oral Daily Caren Griffins, MD   10,000 Units at 06/14/16 1015  . escitalopram (LEXAPRO) tablet 10 mg  10 mg Oral QHS Caren Griffins, MD   10 mg at 06/13/16 2059  . gabapentin (NEURONTIN) capsule 600 mg  600 mg Oral TID Caren Griffins, MD   600 mg at 06/14/16 1015  . insulin aspart (novoLOG) injection 0-9 Units  0-9 Units Subcutaneous Q4H Caren Griffins, MD   1 Units at 06/14/16 1016  . LORazepam (ATIVAN) injection 0.5 mg  0.5 mg Intravenous Q4H PRN Caren Griffins, MD   0.5 mg at 06/14/16 1113  . MEDLINE mouth rinse  15 mL Mouth Rinse q12n4p Costin Karlyne Greenspan, MD      . methocarbamol (ROBAXIN) tablet 500 mg  500 mg Oral Q8H PRN Caren Griffins, MD   500 mg at 06/13/16 1947  . phenol (CHLORASEPTIC) mouth spray 1 spray  1 spray Mouth/Throat PRN Caren Griffins, MD   1 spray at 06/13/16 0424  . rivaroxaban (XARELTO) tablet 20 mg  20 mg  Oral Q breakfast Minda Ditto, RPH   20 mg at 06/14/16 1015    Musculoskeletal: Strength & Muscle Tone: decreased Gait & Station: in bed Patient leans: Backward  Psychiatric Specialty Exam: Physical Exam  ROS  Blood pressure 115/61, pulse 94, temperature 97.5 F (36.4 C), temperature source Axillary, resp. rate 20, height '5\' 8"'$  (1.727 m), weight 112 kg (247 lb), SpO2 97 %.Body mass index is 37.56 kg/m.  General Appearance: Casual and Fairly Groomed  Eye Contact:  Minimal  Speech:  Clear and Coherent and Slow  Volume:  Decreased  Mood:  Depressed and dysphoric  Affect:  Appropriate, Congruent and Depressed  Thought Process:  Goal Directed, Linear and Descriptions of Associations: Loose  Orientation:  Full (Time, Place, and Person)  Thought Content:  Focused on wanting to sleep  Suicidal Thoughts:  Yes.  with intent/plan still has suicide ideations  Homicidal Thoughts:  No  Memory:  Immediate;   Fair Recent;   Fair Remote;   Fair  Judgement:  Fair  Insight:  Fair  Psychomotor Activity:  Normal  Concentration:  Concentration: Fair and Attention Span: Fair  Recall:  [employer] of Knowledge:  Fair  Language:  Fair  Akathisia:  No  Handed:    AIMS (if indicated):     Assets:  Communication Skills Desire for Improvement Resilience Social Support  ADL's:  Intact  Cognition:  WNL  Sleep:      Treatment Plan Summary: Reviewed the treatment plan of acute psych admission and no current medication due to recent intentional overdose  Major depressive disorder, Recurrent,  Acute encephalopathy via suicidal overdose, ingestion of unknown medications, unstable, warrants inpatient psychiatric hospitalization for safety and stabilization once  medically cleared  Please have your hospital SW to reach out to family for collateral.   Disposition: Recommend psychiatric Inpatient admission when medically cleared.  Ambrose Finland, MD 06/14/2016 11:22 AM

## 2016-06-14 NOTE — Progress Notes (Signed)
Patient has been accepted to [facility] Room 400 Bed 1. AC wants patient there at 3:30pm Nurse given report number. [phone] Patient is under IVC and will transport by [facility]. Patient husband went to get patient belongings.   Vivi Barrack, Theresia Majors, MSW Clinical Social Worker 5E and Psychiatric Service Line [phone] 06/14/2016  1:39 PM

## 2016-06-14 NOTE — Progress Notes (Signed)
Patient ID: Deborah Soto, female   DOB: 1965-06-05, 51 y.o.   MRN: [id number] ADMISSION  NOTE  ---   51 year old female admitted in-voluntarily after overdosing on un-known amount (many) Clonidine pills.  Pt has been having marital issues and issues at work and with her  Two older children.   Pt said  " I almost pulled it off, but my stupid husband realized what I had done and called 911 ".   When asked to explain the statement she said  " I REALLY wanted to die, I ment it, I can not keep on living like this ".  Pt has HX of 3 or 4 prior attempts  By cutting and OD going back to her teenage years.  Pt was at [facility] 10 years ago for SA.   Pt was recently DX with TYPE 2 Diabetes and takes metformin.   She comes in on several other medications from home.  He  Allergie to TRAMADOL.    On admission, she was tearfull, depressed and was highly anxious.  She slowly agreed to contract for safety and she denied pain.  Pt was polite and respectful to staff

## 2016-06-14 NOTE — Progress Notes (Deleted)
PROGRESS NOTE  Deborah Soto ZOX:[id number] DOB: [birth year] DOA: 06/11/2016 PCP: Deborah Platt, PA   LOS: 2 days   Brief Narrative: Deborah Soto is a 51 y.o. female with history of recently diagnosed diabetes mellitus type 2, sleep apnea, history of DVT and PE on xarelto and history of depression was brought to the ER after patient was found to be increasingly lethargic and drowsy by the husband. As per the husband patient has been depressed last 2 days and has been threatening suicide.   Assessment & Plan: Principal Problem:   Acute encephalopathy Active Problems:   PTE (pulmonary thromboembolism) (HCC)   Bipolar affective disorder (HCC)   Diabetes mellitus type 2 in obese (HCC)   Suicidal ideation   Intentional drug overdose (HCC)   Acute encephalopathy -Likely due to overdose of unknown substance, now resolved, this morning patient is alert and oriented 4, appears in good mood and is back to baseline.  Psychiatry consulted and evaluated patient while hospitalized. Depression/suicidal ideation -Psychiatry consulted History of PE and DVT -continue Xarelto Recently diagnosed type 2 diabetes -Keep on sliding scale, CBGs are well controlled Sleep apnea -Nightly CPAP Sore throat -strep test is negative, continue supportive treatment, likely viral   DVT prophylaxis: Xarelto Code Status: Full code Family Communication: d/w husband at bedside Disposition Plan: Medically ready for inpatient psych when bed available  Consultants:   Psychiatry   Procedures:   None  Antimicrobials:  None    Subjective: -Alert, smiling, complains of sore throat  Objective: Vitals:   06/13/16 1421 06/13/16 2128 06/13/16 2145 06/14/16 0403  BP: (!) 140/91  126/80 115/61  Pulse: 61 (!) 105 100 94  Resp: 16 16 (!) 24 20  Temp: 98.9 F (37.2 C)  99.1 F (37.3 C) 97.5 F (36.4 C)  TempSrc: Oral  Oral Axillary  SpO2: 100% 98% 98% 97%  Weight:      Height:        Intake/Output  Summary (Last 24 hours) at 06/14/16 1259 Last data filed at 06/14/16 8119  Gross per 24 hour  Intake              480 ml  Output                0 ml  Net              480 ml   Filed Weights   06/11/16 1846  Weight: 112 kg (247 lb)    Examination: Constitutional: NAD Vitals:   06/13/16 1421 06/13/16 2128 06/13/16 2145 06/14/16 0403  BP: (!) 140/91  126/80 115/61  Pulse: 61 (!) 105 100 94  Resp: 16 16 (!) 24 20  Temp: 98.9 F (37.2 C)  99.1 F (37.3 C) 97.5 F (36.4 C)  TempSrc: Oral  Oral Axillary  SpO2: 100% 98% 98% 97%  Weight:      Height:       Eyes: PERRL, lids and conjunctivae normal ENT erythematous oropharynx, no exudates:  Respiratory: clear to auscultation bilaterally, no wheezing, no crackles.  Cardiovascular: Regular rate and rhythm, no murmurs / rubs / gallops.  Abdomen: no tenderness. Bowel sounds positive.  Musculoskeletal: no clubbing / cyanosis.    Data Reviewed: I have personally reviewed following labs and imaging studies  CBC:  Recent Labs Lab 06/11/16 1822 06/12/16 0313 06/13/16 0348 06/14/16 0342  WBC 9.0 9.3 6.7 6.4  HGB 14.0 12.3 12.7 13.2  HCT 41.0 36.4 37.1 38.6  MCV 88.0 91.2 89.8 90.2  PLT 266  222 207 223   Basic Metabolic Panel:  Recent Labs Lab 06/08/16 0940 06/11/16 1822 06/11/16 1926 06/12/16 0313 06/12/16 0647 06/13/16 0348  NA 141 141  --  142  --  142  K 4.2 4.4  --  3.9  --  3.7  CL 101 109  --  112*  --  109  CO2 21 24  --  25  --  24  GLUCOSE 163* 167*  --  129*  --  107*  BUN 16 21*  --  17  --  7  CREATININE 0.60 1.12*  --  0.59  --  0.56  CALCIUM 9.9 8.9  --  8.1*  --  8.4*  MG  --   --  0.8*  --  2.2  --    GFR: Estimated Creatinine Clearance: 112.8 mL/min (by C-G formula based on SCr of 0.56 mg/dL). Liver Function Tests:  Recent Labs Lab 06/08/16 0940 06/11/16 1822 06/13/16 0348  AST 36 46* 39  ALT 45* 50 43  ALKPHOS 56 44 41  BILITOT 0.6 1.0 0.9  PROT 6.5 6.3* 5.9*  ALBUMIN 4.6 4.0 3.7    No results for input(s): LIPASE, AMYLASE in the last 168 hours.  Recent Labs Lab 06/11/16 2306  AMMONIA 25   Coagulation Profile:  Recent Labs Lab 06/11/16 1850  INR 1.52   Cardiac Enzymes: No results for input(s): CKTOTAL, CKMB, CKMBINDEX, TROPONINI in the last 168 hours. BNP (last 3 results) No results for input(s): PROBNP in the last 8760 hours. HbA1C: No results for input(s): HGBA1C in the last 72 hours. CBG:  Recent Labs Lab 06/13/16 2029 06/14/16 0004 06/14/16 0337 06/14/16 0857 06/14/16 1203  GLUCAP 116* 111* 108* 127* 192*   Lipid Profile: No results for input(s): CHOL, HDL, LDLCALC, TRIG, CHOLHDL, LDLDIRECT in the last 72 hours. Thyroid Function Tests:  Recent Labs  06/11/16 1919  TSH 0.676   Anemia Panel: No results for input(s): VITAMINB12, FOLATE, FERRITIN, TIBC, IRON, RETICCTPCT in the last 72 hours. Urine analysis:    Component Value Date/Time   COLORURINE AMBER (A) 06/11/2016 1919   APPEARANCEUR HAZY (A) 06/11/2016 1919   LABSPEC 1.023 06/11/2016 1919   PHURINE 5.0 06/11/2016 1919   GLUCOSEU 50 (A) 06/11/2016 1919   HGBUR MODERATE (A) 06/11/2016 1919   BILIRUBINUR SMALL (A) 06/11/2016 1919   BILIRUBINUR small (A) 06/08/2016 0922   BILIRUBINUR neg 07/29/2014 1536   KETONESUR 20 (A) 06/11/2016 1919   PROTEINUR 30 (A) 06/11/2016 1919   UROBILINOGEN 0.2 06/08/2016 0922   NITRITE NEGATIVE 06/11/2016 1919   LEUKOCYTESUR NEGATIVE 06/11/2016 1919   Sepsis Labs: Invalid input(s): PROCALCITONIN, LACTICIDVEN  Recent Results (from the past 240 hour(s))  Urine culture     Status: None   Collection Time: 06/08/16  9:55 AM  Result Value Ref Range Status   Urine Culture, Routine Final report  Final   Urine Culture result 1 No growth  Final  MRSA PCR Screening     Status: None   Collection Time: 06/12/16 12:43 AM  Result Value Ref Range Status   MRSA by PCR NEGATIVE NEGATIVE Final    Comment:        The GeneXpert MRSA Assay (FDA approved  for NASAL specimens only), is one component of a comprehensive MRSA colonization surveillance program. It is not intended to diagnose MRSA infection nor to guide or monitor treatment for MRSA infections.   Rapid strep screen (not at [facility])     Status: None  Collection Time: 06/13/16  5:18 PM  Result Value Ref Range Status   Streptococcus, Group A Screen (Direct) NEGATIVE NEGATIVE Final    Comment: (NOTE) A Rapid Antigen test may result negative if the antigen level in the sample is below the detection level of this test. The FDA has not cleared this test as a stand-alone test therefore the rapid antigen negative result has reflexed to a Group A Strep culture.   Culture, group A strep     Status: None (Preliminary result)   Collection Time: 06/13/16  5:18 PM  Result Value Ref Range Status   Specimen Description THROAT  Final   Special Requests NONE Reflexed from [id number]  Final   Culture   Final    CULTURE REINCUBATED FOR BETTER GROWTH Performed at [facility], [address] [place], [place] [address]    Report Status PENDING  Incomplete      Radiology Studies: No results found.   Scheduled Meds: . busPIRone  30 mg Oral BID  . chlorhexidine  15 mL Mouth Rinse BID  . cholecalciferol  10,000 Units Oral Daily  . escitalopram  10 mg Oral QHS  . gabapentin  600 mg Oral TID  . insulin aspart  0-9 Units Subcutaneous Q4H  . mouth rinse  15 mL Mouth Rinse q12n4p  . rivaroxaban  20 mg Oral Q breakfast   Continuous Infusions:   Pamella Pert, MD, PhD [facility] Pager [phone]  If 7PM-7AM, please contact night-coverage [email or web address] Password [facility] 06/14/2016, 12:59 PM

## 2016-06-14 NOTE — Progress Notes (Signed)
Pt did not attend Music Therapy this evening.  

## 2016-06-14 NOTE — Discharge Summary (Addendum)
Physician Discharge Summary  Deborah Soto TJQ:[id number] DOB: 08-26-1965 DOA: 06/11/2016  PCP: Ivar Drape, PA  Admit date: 06/11/2016 Discharge date: 06/14/2016  Admitted From: home  Disposition: Behavioral health  Recommendations for Outpatient Follow-up:  1. Further treatment per behavioral health Hospital  Discharge Condition: Stable CODE STATUS: Full code Diet recommendation: Regular  HPI: per Dr. Hal Hope, Deborah Soto is a 51 y.o. female with history of recently diagnosed diabetes mellitus type 2, sleep apnea, history of DVT and PE on xarelto and history of depression was brought to the ER after patient was found to be increasingly lethargic and drowsy by the husband. As per the husband patient has been depressed last 2 days and has been threatening suicide. Patient had taken her home medications yesterday. This morning when husband was planning to give the medications to her she refused. Since patient was looking more lethargic husband plan to work at home. In the afternoon patient was found walking in the kitchen and later on was found to be on the bed lethargic. Later in the evening around 4-5 PM husband called EMS and patient was brought to the ER. ED Course: CT of the head was unremarkable. Initial ABG and later ABG did not show any carbon dioxide retention. Drug screen was positive for benzodiazepine. As per the husband patient had taken 1 dose of Seroquel which may be new for patient. Otherwise all her medications were accounted for. On my exam patient is minimally responsive. Pupils are reacting.  Hospital Course: Discharge Diagnoses:  Principal Problem:   Acute encephalopathy Active Problems:   PTE (pulmonary thromboembolism) ([place])   Diabetes mellitus type 2 in obese (HCC)   Suicidal ideation   Intentional drug overdose ([name])  Acute encephalopathy -patient was admitted to the hospital with acute encephalopathy likely in the setting of overdose of an unknown  substance.  She was initially admitted to stepdown, and within 24-36 hours her encephalopathy resolved, and currently she is back to baseline.  Given concern for suicide intent and overdose, psychiatry was consulted, who evaluated patient and recommended inpatient psychiatric admission.  Currently patient is medically stable, and will be transferred to behavioral health Hospital. Depression/suicidal ideation -Psychiatry consulted, further treatment in an inpatient psychiatric sending History of PE and DVT -continue Xarelto Recently diagnosed type 2 diabetes -continue home medications Sleep apnea -Nightly CPAP Sore throat -strep test is negative, continue supportive treatment, likely viral   Discharge Instructions   Allergies as of 06/14/2016      Reactions   Tramadol Other (See Comments)   Tingling all over       Medication List    TAKE these medications   blood glucose meter kit and supplies Kit Dispense based on patient and insurance preference. Use up to four times daily as directed. (FOR ICD-9 250.00, 250.01).   busPIRone 30 MG tablet Commonly known as:  BUSPAR Take 30 mg by mouth 2 (two) times daily.   escitalopram 10 MG tablet Commonly known as:  LEXAPRO Take 10 mg by mouth at bedtime.   gabapentin 600 MG tablet Commonly known as:  NEURONTIN Take 2 tablets (1,200 mg total) by mouth 3 (three) times daily.   lidocaine 5 % Commonly known as:  LIDODERM Place 1 patch onto the skin daily. Remove & Discard patch within 12 hours or as directed by MD   MAGNESIUM PO Take 1 tablet by mouth daily.   metFORMIN 500 MG tablet Commonly known as:  GLUCOPHAGE Take 1 tablet (500 mg total) by mouth  2 (two) times daily with a meal. Initial 1 tablet for the first week. What changed:  how much to take  additional instructions   methocarbamol 750 MG tablet Commonly known as:  ROBAXIN-750 Take 1 tablet (750 mg total) by mouth every 8 (eight) hours as needed for muscle spasms.    ONETOUCH VERIO test strip Generic drug:  glucose blood CHECK BLOOD SUGAR UP TO 4 TIMES A DAY AS DIRECTED.   VITAMIN B-12 CR PO Take 2 tablets by mouth daily.   Vitamin D3 5000 units Caps Take 10,000 Units by mouth daily.   XARELTO 20 MG Tabs tablet Generic drug:  rivaroxaban TAKE 1 TABLET DAILY WITH SUPPER.       Allergies  Allergen Reactions  . Tramadol Other (See Comments)    Tingling all over     Consultations:  Psychiatry  Procedures/Studies:  None  Dg Chest 2 View  Result Date: 06/11/2016 CLINICAL DATA:  Overdose on unknown substance. EXAM: CHEST  2 VIEW COMPARISON:  March 9th 2018 FINDINGS: The heart size and mediastinal contours are within normal limits. Both lungs are clear. The visualized skeletal structures are unremarkable. IMPRESSION: No active cardiopulmonary disease. Electronically Signed   By: Abelardo Diesel M.D.   On: 06/11/2016 20:41   Dg Chest 2 View  Result Date: 05/18/2016 CLINICAL DATA:  Chest pressure EXAM: CHEST  2 VIEW COMPARISON:  08/16/2015 FINDINGS: Heart and mediastinal contours are within normal limits. No focal opacities or effusions. No acute bony abnormality. IMPRESSION: No active cardiopulmonary disease. Electronically Signed   By: Rolm Baptise M.D.   On: 05/18/2016 10:53   Ct Head Wo Contrast  Result Date: 06/11/2016 CLINICAL DATA:  Overdose on unknown substance. EXAM: CT HEAD WITHOUT CONTRAST TECHNIQUE: Contiguous axial images were obtained from the base of the skull through the vertex without intravenous contrast. COMPARISON:  June 14, 2007 FINDINGS: Brain: No evidence of acute infarction, hemorrhage, hydrocephalus, extra-axial collection or mass lesion/mass effect. Vascular: No hyperdense vessel or unexpected calcification. Skull: Normal. Negative for fracture or focal lesion. Sinuses/Orbits: No acute finding. Other: None. IMPRESSION: No focal acute intracranial abnormality identified. Electronically Signed   By: Abelardo Diesel M.D.   On:  06/11/2016 19:41   Ct Angio Chest Aorta W And/or Wo Contrast  Result Date: 05/30/2016 CLINICAL DATA:  Anterior chest pain radiating to back with shortness of breath, nausea and tightness of chest. EXAM: CT ANGIOGRAPHY CHEST WITH CONTRAST TECHNIQUE: Multidetector CT imaging of the chest was performed using the standard protocol during bolus administration of intravenous contrast. Multiplanar CT image reconstructions and MIPs were obtained to evaluate the vascular anatomy. CONTRAST:  100 mL Omnipaque 300 COMPARISON:  None. FINDINGS: Cardiovascular: Satisfactory opacification of the pulmonary arteries to the segmental level. No evidence of pulmonary embolism. Normal heart size. No pericardial effusion. Normal caliber thoracic aorta. No thoracic aortic dissection. Mediastinum/Nodes: No enlarged mediastinal, hilar, or axillary lymph nodes. Thyroid gland, trachea, and esophagus demonstrate no significant findings. Lungs/Pleura: Lungs are clear. No pleural effusion or pneumothorax. Upper Abdomen: No acute abnormality. Musculoskeletal: No chest wall abnormality. No acute or significant osseous findings. Review of the MIP images confirms the above findings. IMPRESSION: 1. No evidence pulmonary embolus 2. No thoracic aortic aneurysm or dissection. Electronically Signed   By: Kathreen Devoid   On: 05/30/2016 15:38   [name] Rt Lower Extrem Ltd Soft Tissue Non Vascular  Result Date: 05/28/2016 CLINICAL DATA:  Chronic medial right thigh nodularity and tenderness. Initial encounter. EXAM: ULTRASOUND RIGHT LOWER EXTREMITY LIMITED  TECHNIQUE: Ultrasound examination of the lower extremity soft tissues was performed in the area of clinical concern. COMPARISON:  None. FINDINGS: Muscles: Normal. Other Soft Tissue Structures: A mildly prominent 1.1 cm node is noted at the right medial upper thigh. No suspicious solid or cystic lesions are noted at the location of the patient's symptoms. There is no evidence of soft tissue edema. There is  no evidence of abscess. IMPRESSION: 1. No acute abnormality seen at the site of the patient's symptoms. No evidence of abscess. No evidence of soft tissue edema. 2. Mildly prominent 1.1 cm node at the right medial upper thigh may remain within normal limits. Electronically Signed   By: Garald Balding M.D.   On: 05/28/2016 19:14     Subjective: - no chest pain, shortness of breath, no abdominal pain, nausea or vomiting.   Discharge Exam: Vitals:   06/13/16 2145 06/14/16 0403  BP: 126/80 115/61  Pulse: 100 94  Resp: (!) 24 20  Temp: 99.1 F (37.3 C) 97.5 F (36.4 C)   Vitals:   06/13/16 1421 06/13/16 2128 06/13/16 2145 06/14/16 0403  BP: (!) 140/91  126/80 115/61  Pulse: 61 (!) 105 100 94  Resp: 16 16 (!) 24 20  Temp: 98.9 F (37.2 C)  99.1 F (37.3 C) 97.5 F (36.4 C)  TempSrc: Oral  Oral Axillary  SpO2: 100% 98% 98% 97%  Weight:      Height:        General: Pt is alert, awake, not in acute distress Cardiovascular: RRR, S1/S2 +, no rubs, no gallops Respiratory: CTA bilaterally, no wheezing, no rhonchi Abdominal: Soft, NT, ND, bowel sounds + Extremities: no edema, no cyanosis    The results of significant diagnostics from this hospitalization (including imaging, microbiology, ancillary and laboratory) are listed below for reference.     Microbiology: Recent Results (from the past 240 hour(s))  Urine culture     Status: None   Collection Time: 06/08/16  9:55 AM  Result Value Ref Range Status   Urine Culture, Routine Final report  Final   Urine Culture result 1 No growth  Final  MRSA PCR Screening     Status: None   Collection Time: 06/12/16 12:43 AM  Result Value Ref Range Status   MRSA by PCR NEGATIVE NEGATIVE Final    Comment:        The GeneXpert MRSA Assay (FDA approved for NASAL specimens only), is one component of a comprehensive MRSA colonization surveillance program. It is not intended to diagnose MRSA infection nor to guide or monitor treatment  for MRSA infections.   Rapid strep screen (not at [facility])     Status: None   Collection Time: 06/13/16  5:18 PM  Result Value Ref Range Status   Streptococcus, Group A Screen (Direct) NEGATIVE NEGATIVE Final    Comment: (NOTE) A Rapid Antigen test may result negative if the antigen level in the sample is below the detection level of this test. The FDA has not cleared this test as a stand-alone test therefore the rapid antigen negative result has reflexed to a Group A Strep culture.   Culture, group A strep     Status: None (Preliminary result)   Collection Time: 06/13/16  5:18 PM  Result Value Ref Range Status   Specimen Description THROAT  Final   Special Requests NONE Reflexed from [id number]  Final   Culture   Final    CULTURE REINCUBATED FOR BETTER GROWTH Performed at [facility],  [address] [place], [place] [address]    Report Status PENDING  Incomplete     Labs: BNP (last 3 results) No results for input(s): BNP in the last 8760 hours. Basic Metabolic Panel:  Recent Labs Lab 06/08/16 0940 06/11/16 1822 06/11/16 1926 06/12/16 0313 06/12/16 0647 06/13/16 0348  NA 141 141  --  142  --  142  K 4.2 4.4  --  3.9  --  3.7  CL 101 109  --  112*  --  109  CO2 21 24  --  25  --  24  GLUCOSE 163* 167*  --  129*  --  107*  BUN 16 21*  --  17  --  7  CREATININE 0.60 1.12*  --  0.59  --  0.56  CALCIUM 9.9 8.9  --  8.1*  --  8.4*  MG  --   --  0.8*  --  2.2  --    Liver Function Tests:  Recent Labs Lab 06/08/16 0940 06/11/16 1822 06/13/16 0348  AST 36 46* 39  ALT 45* 50 43  ALKPHOS 56 44 41  BILITOT 0.6 1.0 0.9  PROT 6.5 6.3* 5.9*  ALBUMIN 4.6 4.0 3.7   No results for input(s): LIPASE, AMYLASE in the last 168 hours.  Recent Labs Lab 06/11/16 2306  AMMONIA 25   CBC:  Recent Labs Lab 06/11/16 1822 06/12/16 0313 06/13/16 0348 06/14/16 0342  WBC 9.0 9.3 6.7 6.4  HGB 14.0 12.3 12.7 13.2  HCT 41.0 36.4 37.1 38.6  MCV 88.0 91.2 89.8 90.2  PLT 266  222 207 223   Cardiac Enzymes: No results for input(s): CKTOTAL, CKMB, CKMBINDEX, TROPONINI in the last 168 hours. BNP: Invalid input(s): POCBNP CBG:  Recent Labs Lab 06/13/16 2029 06/14/16 0004 06/14/16 0337 06/14/16 0857 06/14/16 1203  GLUCAP 116* 111* 108* 127* 192*   D-Dimer No results for input(s): DDIMER in the last 72 hours. Hgb A1c No results for input(s): HGBA1C in the last 72 hours. Lipid Profile No results for input(s): CHOL, HDL, LDLCALC, TRIG, CHOLHDL, LDLDIRECT in the last 72 hours. Thyroid function studies  Recent Labs  06/11/16 1919  TSH 0.676   Anemia work up No results for input(s): VITAMINB12, FOLATE, FERRITIN, TIBC, IRON, RETICCTPCT in the last 72 hours. Urinalysis    Component Value Date/Time   COLORURINE AMBER (A) 06/11/2016 1919   APPEARANCEUR HAZY (A) 06/11/2016 1919   LABSPEC 1.023 06/11/2016 1919   PHURINE 5.0 06/11/2016 1919   GLUCOSEU 50 (A) 06/11/2016 1919   HGBUR MODERATE (A) 06/11/2016 1919   BILIRUBINUR SMALL (A) 06/11/2016 1919   BILIRUBINUR small (A) 06/08/2016 0922   BILIRUBINUR neg 07/29/2014 1536   KETONESUR 20 (A) 06/11/2016 1919   PROTEINUR 30 (A) 06/11/2016 1919   UROBILINOGEN 0.2 06/08/2016 0922   NITRITE NEGATIVE 06/11/2016 1919   LEUKOCYTESUR NEGATIVE 06/11/2016 1919   Sepsis Labs Invalid input(s): PROCALCITONIN,  WBC,  LACTICIDVEN Microbiology Recent Results (from the past 240 hour(s))  Urine culture     Status: None   Collection Time: 06/08/16  9:55 AM  Result Value Ref Range Status   Urine Culture, Routine Final report  Final   Urine Culture result 1 No growth  Final  MRSA PCR Screening     Status: None   Collection Time: 06/12/16 12:43 AM  Result Value Ref Range Status   MRSA by PCR NEGATIVE NEGATIVE Final    Comment:        The GeneXpert MRSA  Assay (FDA approved for NASAL specimens only), is one component of a comprehensive MRSA colonization surveillance program. It is not intended to diagnose  MRSA infection nor to guide or monitor treatment for MRSA infections.   Rapid strep screen (not at [facility])     Status: None   Collection Time: 06/13/16  5:18 PM  Result Value Ref Range Status   Streptococcus, Group A Screen (Direct) NEGATIVE NEGATIVE Final    Comment: (NOTE) A Rapid Antigen test may result negative if the antigen level in the sample is below the detection level of this test. The FDA has not cleared this test as a stand-alone test therefore the rapid antigen negative result has reflexed to a Group A Strep culture.   Culture, group A strep     Status: None (Preliminary result)   Collection Time: 06/13/16  5:18 PM  Result Value Ref Range Status   Specimen Description THROAT  Final   Special Requests NONE Reflexed from [id number]  Final   Culture   Final    CULTURE REINCUBATED FOR BETTER GROWTH Performed at [facility], [place] [address], [place], [place] [address]    Report Status PENDING  Incomplete     Time coordinating discharge: 35 minutes  SIGNED:  [name], MD  [facility] 06/14/2016, 1:42 PM Pager [phone]  If 7PM-7AM, please contact night-coverage [email or web address] Password [email or web address]

## 2016-06-14 NOTE — Progress Notes (Signed)
DAR Note: At shift change, MHT notified this writer that patient complained of "weird feeling of high sugar level". CBG was 197 mg/dl and V/S within normal (see MAR). Patient reported anxiety and mild palpitation. Patient requesting to get insulin. Staff explained and educate patient on her medication regimen. Barbara Cower NP, notified who requested patient continue with her metformin order and for review by her Dr. tomorrow. Patient reassured and offered support as needed. Accepted her bedtime med. Sleeping at this time. Uses (CPAP). No further complaint. Safety checks maintained at all times. Will continue to monitor patient.

## 2016-06-14 NOTE — Tx Team (Addendum)
Initial Treatment Plan 06/14/2016 5:58 PM Deborah Soto ZOX:[id number]    PATIENT STRESSORS: Marital or family conflict   PATIENT STRENGTHS: Communication skills Motivation for treatment/growth Supportive family/friends   PATIENT IDENTIFIED PROBLEMS: Suicidal ideati9on  depression  anxiety                 DISCHARGE CRITERIA:  Improved stabilization in mood, thinking, and/or behavior Reduction of life-threatening or endangering symptoms to within safe limits  PRELIMINARY DISCHARGE PLAN: Outpatient therapy  PATIENT/FAMILY INVOLVEMENT: This treatment plan has been presented to and reviewed with the patient, Deborah Soto, and/or family member, *pt.  The patient and family have been given the opportunity to ask questions and make suggestions.  Arsenio Loader, RN 06/14/2016, 5:58 PM

## 2016-06-15 ENCOUNTER — Encounter (HOSPITAL_COMMUNITY): Payer: Self-pay

## 2016-06-15 DIAGNOSIS — G9340 Encephalopathy, unspecified: Secondary | ICD-10-CM

## 2016-06-15 DIAGNOSIS — Z810 Family history of intellectual disabilities: Secondary | ICD-10-CM

## 2016-06-15 DIAGNOSIS — F332 Major depressive disorder, recurrent severe without psychotic features: Principal | ICD-10-CM

## 2016-06-15 DIAGNOSIS — T1491XA Suicide attempt, initial encounter: Secondary | ICD-10-CM

## 2016-06-15 DIAGNOSIS — E1169 Type 2 diabetes mellitus with other specified complication: Secondary | ICD-10-CM

## 2016-06-15 DIAGNOSIS — T424X2A Poisoning by benzodiazepines, intentional self-harm, initial encounter: Secondary | ICD-10-CM

## 2016-06-15 DIAGNOSIS — E669 Obesity, unspecified: Secondary | ICD-10-CM

## 2016-06-15 DIAGNOSIS — Z79899 Other long term (current) drug therapy: Secondary | ICD-10-CM

## 2016-06-15 LAB — GLUCOSE, CAPILLARY
Glucose-Capillary: 210 mg/dL — ABNORMAL HIGH (ref 65–99)
Glucose-Capillary: 115 mg/dL — ABNORMAL HIGH (ref 65–99)
Glucose-Capillary: 127 mg/dL — ABNORMAL HIGH (ref 65–99)
Glucose-Capillary: 117 mg/dL — ABNORMAL HIGH (ref 65–99)

## 2016-06-15 MED ORDER — GABAPENTIN 400 MG PO CAPS
800.0000 mg | ORAL_CAPSULE | Freq: Three times a day (TID) | ORAL | Status: DC
  Administered 2016-06-16 – 2016-06-21 (×17): 800 mg via ORAL
  Filled 2016-06-15 (×23): qty 2

## 2016-06-15 MED ORDER — GABAPENTIN 400 MG PO CAPS
1200.0000 mg | ORAL_CAPSULE | Freq: Three times a day (TID) | ORAL | Status: DC
  Administered 2016-06-15: 1200 mg via ORAL
  Filled 2016-06-15 (×5): qty 3

## 2016-06-15 MED ORDER — ESCITALOPRAM OXALATE 5 MG PO TABS
15.0000 mg | ORAL_TABLET | Freq: Every day | ORAL | Status: DC
  Administered 2016-06-15 – 2016-06-19 (×5): 15 mg via ORAL
  Filled 2016-06-15 (×8): qty 1

## 2016-06-15 MED ORDER — METFORMIN HCL 500 MG PO TABS
1000.0000 mg | ORAL_TABLET | Freq: Every day | ORAL | Status: DC
  Administered 2016-06-16 – 2016-06-21 (×6): 1000 mg via ORAL
  Filled 2016-06-15 (×8): qty 2

## 2016-06-15 MED ORDER — METFORMIN HCL 500 MG PO TABS
500.0000 mg | ORAL_TABLET | Freq: Every evening | ORAL | Status: DC
  Administered 2016-06-15 – 2016-06-20 (×6): 500 mg via ORAL
  Filled 2016-06-15 (×8): qty 1

## 2016-06-15 NOTE — [facility] Suicide Risk Assessment (Signed)
[facility] INPATIENT:  Family/Significant Other Suicide Prevention Education  Suicide Prevention Education:  Education Completed; Naiyana Barbian, husband [phone])  has been identified by the patient as the family member/significant other with whom the patient will be residing, and identified as the person(s) who will aid the patient in the event of a mental health crisis (suicidal ideations/suicide attempt).  With written consent from the patient, the family member/significant other has been provided the following suicide prevention education, prior to the and/or following the discharge of the patient.  The suicide prevention education provided includes the following:  Suicide risk factors  Suicide prevention and interventions  National Suicide Hotline telephone number  [facility] assessment telephone number  [facility] Emergency Assistance 911  [facility] and/or Residential Mobile Crisis Unit telephone number  Request made of family/significant other to:  Remove weapons (e.g., guns, rifles, knives), all items previously/currently identified as safety concern.    Remove drugs/medications (over-the-counter, prescriptions, illicit drugs), all items previously/currently identified as a safety concern.  The family member/significant other verbalizes understanding of the suicide prevention education information provided.  The family member/significant other agrees to remove the items of safety concern listed above.  Baldo Daub 06/15/2016, 11:23 AM

## 2016-06-15 NOTE — Tx Team (Signed)
Interdisciplinary Treatment and Diagnostic Plan Update 06/15/2016 Time of Session: 9:30am  Deborah Soto  MRN: [id number]  Principal Diagnosis: Intentional drug overdose [facility])  Secondary Diagnoses: Principal Problem:   Intentional drug overdose ([place]) Active Problems:   MDD (major depressive disorder), recurrent severe, without psychosis ([place])   Current Medications:  Current Facility-Administered Medications  Medication Dose Route Frequency Provider Last Rate Last Dose  . acetaminophen (TYLENOL) tablet 650 mg  650 mg Oral Q6H PRN Ethelene Hal, NP      . alum & mag hydroxide-simeth (MAALOX/MYLANTA) 200-200-20 MG/5ML suspension 30 mL  30 mL Oral Q4H PRN Ethelene Hal, NP      . busPIRone (BUSPAR) tablet 30 mg  30 mg Oral BID Ethelene Hal, NP   30 mg at 06/15/16 0258  . escitalopram (LEXAPRO) tablet 10 mg  10 mg Oral QHS Ethelene Hal, NP   10 mg at 06/14/16 2107  . gabapentin (NEURONTIN) capsule 1,200 mg  1,200 mg Oral TID Encarnacion Slates, NP      . hydrOXYzine (ATARAX/VISTARIL) tablet 25 mg  25 mg Oral Q6H PRN Ethelene Hal, NP   25 mg at 06/14/16 2107  . magnesium hydroxide (MILK OF MAGNESIA) suspension 30 mL  30 mL Oral Daily PRN Ethelene Hal, NP      . menthol-cetylpyridinium (CEPACOL) lozenge 3 mg  1 lozenge Oral PRN Ethelene Hal, NP      . Derrill Memo ON 06/16/2016] metFORMIN (GLUCOPHAGE) tablet 1,000 mg  1,000 mg Oral Q breakfast Encarnacion Slates, NP      . metFORMIN (GLUCOPHAGE) tablet 500 mg  500 mg Oral QPM Encarnacion Slates, NP      . rivaroxaban (XARELTO) tablet 20 mg  20 mg Oral Q supper Ethelene Hal, NP      . traZODone (DESYREL) tablet 50 mg  50 mg Oral QHS PRN Ethelene Hal, NP   50 mg at 06/14/16 2107    PTA Medications: Prescriptions Prior to Admission  Medication Sig Dispense Refill Last Dose  . blood glucose meter kit and supplies KIT Dispense based on patient and insurance preference. Use up to four times daily  as directed. (FOR ICD-9 250.00, 250.01). 1 each 0 Taking  . busPIRone (BUSPAR) 30 MG tablet Take 30 mg by mouth 2 (two) times daily.   06/10/2016 at Unknown time  . Cholecalciferol (VITAMIN D3) 5000 units CAPS Take 10,000 Units by mouth daily.    Past Week at Unknown time  . Cyanocobalamin (VITAMIN B-12 CR PO) Take 2 tablets by mouth daily.    Past Week at Unknown time  . escitalopram (LEXAPRO) 10 MG tablet Take 10 mg by mouth at bedtime.    06/10/2016 at Unknown time  . gabapentin (NEURONTIN) 600 MG tablet Take 2 tablets (1,200 mg total) by mouth 3 (three) times daily. 180 tablet 1 06/10/2016 at Unknown time  . lidocaine (LIDODERM) 5 % Place 1 patch onto the skin daily. Remove & Discard patch within 12 hours or as directed by MD 30 patch 0 Past Week at Unknown time  . MAGNESIUM PO Take 1 tablet by mouth daily.   Past Week at Unknown time  . metFORMIN (GLUCOPHAGE) 500 MG tablet Take 1 tablet (500 mg total) by mouth 2 (two) times daily with a meal. Initial 1 tablet for the first week. (Patient taking differently: Take 500-1,000 mg by mouth 2 (two) times daily with a meal. Take 1 tablet in the am and Take 2  tablets in the evening.) 60 tablet 1 06/10/2016 at Unknown time  . methocarbamol (ROBAXIN-750) 750 MG tablet Take 1 tablet (750 mg total) by mouth every 8 (eight) hours as needed for muscle spasms. 90 tablet 1 Past Month at Unknown time  . ONETOUCH VERIO test strip CHECK BLOOD SUGAR UP TO 4 TIMES A DAY AS DIRECTED. 100 each 3 Taking  . XARELTO 20 MG TABS tablet TAKE 1 TABLET DAILY WITH SUPPER. 90 tablet 1 06/10/2016 at 2030    Treatment Modalities: Medication Management, Group therapy, Case management,  1 to 1 session with clinician, Psychoeducation, Recreational therapy.  Patient Stressors: Marital or family conflict Patient Strengths: Agricultural engineer for treatment/growth Supportive family/friends  Physician Treatment Plan for Primary Diagnosis: Intentional drug overdose ([place]) Long  Term Goal(s): Improvement in symptoms so as ready for discharge Short Term Goals: Ability to identify changes in lifestyle to reduce recurrence of condition will improve Ability to verbalize feelings will improve Ability to disclose and discuss suicidal ideas Ability to demonstrate self-control will improve Ability to identify and develop effective coping behaviors will improve Compliance with prescribed medications will improve Ability to identify triggers associated with substance abuse/mental health issues will improve  Medication Management: Evaluate patient's response, side effects, and tolerance of medication regimen.  Therapeutic Interventions: 1 to 1 sessions, Unit Group sessions and Medication administration.  Evaluation of Outcomes: Not Met  Physician Treatment Plan for Secondary Diagnosis: Principal Problem:   Intentional drug overdose ([place]) Active Problems:   MDD (major depressive disorder), recurrent severe, without psychosis ([place])  Long Term Goal(s): Improvement in symptoms so as ready for discharge  Short Term Goals: Ability to identify changes in lifestyle to reduce recurrence of condition will improve Ability to verbalize feelings will improve Ability to disclose and discuss suicidal ideas Ability to demonstrate self-control will improve Ability to identify and develop effective coping behaviors will improve Compliance with prescribed medications will improve Ability to identify triggers associated with substance abuse/mental health issues will improve  Medication Management: Evaluate patient's response, side effects, and tolerance of medication regimen.  Therapeutic Interventions: 1 to 1 sessions, Unit Group sessions and Medication administration.  Evaluation of Outcomes: Not Met  RN Treatment Plan for Primary Diagnosis: Intentional drug overdose ([place]) Long Term Goal(s): Knowledge of disease and therapeutic regimen to maintain health will improve  Short Term  Goals: Ability to verbalize feelings will improve and Compliance with prescribed medications will improve  Medication Management: RN will administer medications as ordered by provider, will assess and evaluate patient's response and provide education to patient for prescribed medication. RN will report any adverse and/or side effects to prescribing provider.  Therapeutic Interventions: 1 on 1 counseling sessions, Psychoeducation, Medication administration, Evaluate responses to treatment, Monitor vital signs and CBGs as ordered, Perform/monitor CIWA, COWS, AIMS and Fall Risk screenings as ordered, Perform wound care treatments as ordered.  Evaluation of Outcomes: Not Met  LCSW Treatment Plan for Primary Diagnosis: Intentional drug overdose ([place]) Long Term Goal(s): Safe transition to appropriate next level of care at discharge, Engage patient in therapeutic group addressing interpersonal concerns. Short Term Goals: Engage patient in aftercare planning with referrals and resources, Increase emotional regulation, Identify triggers associated with mental health/substance abuse issues and Increase skills for wellness and recovery  Therapeutic Interventions: Assess for all discharge needs, 1 to 1 time with Social worker, Explore available resources and support systems, Assess for adequacy in community support network, Educate family and significant other(s) on suicide prevention, Complete Psychosocial Assessment, Interpersonal group  therapy.  Evaluation of Outcomes: Not Met  Progress in Treatment: Attending groups: Pt is new to milieu, continuing to assess  Participating in groups: Pt is new to milieu, continuing to assess  Taking medication as prescribed: Yes, MD continues to assess for medication changes as needed Toleration medication: Yes, no side effects reported at this time Family/Significant other contact made: Yes, pt's husband contacted. Patient understands diagnosis: Continuing to  assess Discussing patient identified problems/goals with staff: Yes Medical problems stabilized or resolved: Yes Denies suicidal/homicidal ideation: No, pt recently admitted for suicidal ideation. Issues/concerns per patient self-inventory: None Other: N/A  New problem(s) identified: None identified at this time.   New Short Term/Long Term Goal(s): None identified at this time.   Discharge Plan or Barriers: Pt will return home and follow up outpatient with [facility].  Reason for Continuation of Hospitalization:  Anxiety  Depression Medication stabilization Suicidal ideation  Estimated Length of Stay: 3-5 days  Attendees: Patient: 06/15/2016 3:13 PM  Physician: Dr. Parke Poisson 06/15/2016 3:13 PM  Nursing: Chong Sicilian RN; [place], [place] 06/15/2016 3:13 PM  RN Care Manager: Lars Pinks, RN 06/15/2016 3:13 PM  Social Worker: Matthew Saras, [place] 06/15/2016 3:13 PM  Recreational Therapist:  06/15/2016 3:13 PM  Other: Lindell Spar, NP; Samuel Jester, NP 06/15/2016 3:13 PM  Other:  06/15/2016 3:13 PM  Other: 06/15/2016 3:13 PM   Scribe for Treatment Team: Georga Kaufmann, MSW,LCSWA 06/15/2016 3:13 PM
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
## 2016-06-15 NOTE — Progress Notes (Signed)
Patient ID: Deborah Soto, female   DOB: 1965/04/07, 51 y.o.   MRN: [id number]  Pt currently presents with a labile affect and anxious behavior. Pt reports to writer that their goal is to "get something for me fibromyalgia and depression." Pt states "I was hoping to be on Celebrex because I heard it will also help with my pain." Pt reports good sleep with current medication regimen. Pt reports increased anxiety.   Pt provided with medications per providers orders. Pt's labs and vitals were monitored throughout the night. Pt given a 1:1 about emotional and mental status. Pt supported and encouraged to express concerns and questions. Pt educated on medications and anxiety diagnosis.   Pt's safety ensured with 15 minute and environmental checks. Pt currently denies SI/HI and A/V hallucinations. Pt verbally agrees to seek staff if SI/HI or A/VH occurs and to consult with staff before acting on any harmful thoughts. Will continue POC.

## 2016-06-15 NOTE — Progress Notes (Signed)
Adult Psychoeducational Group Note  Date:  06/15/2016 Time:  1:01 PM  Group Topic/Focus:  Goals Group:   The focus of this group is to help patients establish daily goals to achieve during treatment and discuss how the patient can incorporate goal setting into their daily lives to aide in recovery.  Participation Level:  Did Not Attend  Participation Quality:  DID not Attend  Affect:  Did not attend  Cognitive:  Did not attend  Insight: None  Engagement in Group:  None and Did not attend  Modes of Intervention:  Activity, Discussion, Education, Socialization and Support  Additional Comments:  Did not attend  Dolores Hoose 06/15/2016, 1:01 PM

## 2016-06-15 NOTE — Progress Notes (Signed)
Data. Patient denies HI/AVH. Continues to endorse SI, stating, "The thoughts come and go." Patient does not have an active plan and is able to verbally contract for safety on  The unit and to come to staff prior to acting on any self harm thoughts/feelings. Patient has been tearful at the beginning of shift and she stated, "Everyone has told my how awful I am form the time I was a baby. There is not one positive thought I have." In the late morning patient was able to leave her room and has been on the unit in the milieu since, interacting well with staff and other patients. Affect is flat, but does brighten with interaction and with discussion of her children. On her self assessment patient reports 10/10 for depression and hopelessness and 9/10 for anxiety.  Action. Emotional support and encouragement offered. Education provided on medication, indications and side effect. Q 15 minute checks done for safety. Response. Safety on the unit maintained through 15 minute checks.  Medications taken as prescribed. Attended groups. Remained calm and appropriate through out shift.

## 2016-06-15 NOTE — Progress Notes (Signed)
Recreation Therapy Notes  Date:  06/15/16    Time: 0930 Location: [address] Dayroom  Group Topic: Stress Management  Goal Area(s) Addresses:  Patient will verbalize importance of using healthy stress management.  Patient will identify positive emotions associated with healthy stress management.   Intervention: Stress Management  Activity :  Gratitude Meditation.  LRT introduced the stress management technique of meditation to patients.  LRT played a meditation that allowed patients to focus on breathing and being grateful for people who have been supportive in their lives.  Patients were to follow along as the meditation played to fully participate and engage in the activity.  Education:  Stress Management, Discharge Planning.   Education Outcome: Acknowledges edcuation/In group clarification offered/Needs additional education  Clinical Observations/Feedback: Pt did not attend group.    Sylvain Hasten, LRT/CTRS         Anum Palecek A 06/15/2016 11:35 AM 

## 2016-06-15 NOTE — [facility] Suicide Risk Assessment (Signed)
[facility] Admission Suicide Risk Assessment   Nursing information obtained from:  Patient and chart  Demographic factors:  51 year old married female Current Mental Status:   See below Loss Factors:  Family stressors  Historical Factors:  History of depression Risk Reduction Factors:  Positive therapeutic relationship  Total Time spent with patient: 45 minutes Principal Problem: Intentional drug overdose (HCC) Diagnosis:   Patient Active Problem List   Diagnosis Date Noted  . MDD (major depressive disorder), recurrent severe, without psychosis (HCC) [F33.2] 06/14/2016  . Intentional drug overdose (HCC) [T50.902A]   . Acute encephalopathy [G93.40] 06/11/2016  . Diabetes mellitus type 2 in obese (HCC) [E11.69, E66.9] 06/11/2016  . Suicidal ideation [R45.851] 06/11/2016  . Positive ANA (antinuclear antibody) [R76.8] 04/02/2016  . Hyperparathyroidism (HCC) [E21.3] 01/09/2016  . Snoring [R06.83] 09/20/2015  . 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] 05/12/2015  . Hepatic steatosis [K76.0] 04/18/2015  . Left sided chest pain [R07.9]   . Dyspnea [R06.00]   . Achilles tendonitis [M76.60] 04/08/2012  . 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] 04/08/2012  . PTE (pulmonary thromboembolism) (HCC) [I26.99] 10/10/2011  . Preeclampsia [O14.90] 10/10/2011  . Diverticulitis [K57.92] 10/10/2011  . Bipolar affective disorder (HCC) [F31.9] 10/10/2011  . BMI 34.0-34.9,adult [Z68.34] 10/10/2011  . AR (allergic rhinitis) [J30.9] 10/10/2011    Continued Clinical Symptoms:    The "Alcohol Use Disorders Identification Test", Guidelines for Use in Primary Care, Second Edition.  World Science writer [facility]). Score between 0-7:  no or low risk or alcohol related problems. Score between 8-15:  moderate risk of alcohol related problems. Score between 16-19:  high risk of alcohol related problems. Score 20 or above:  warrants further diagnostic evaluation for alcohol dependence and  treatment.   CLINICAL FACTORS:  Patient is a 51 year old married female- lives with husband, mother in Social worker, and daughter. She reports history of depression.  Patient was admitted following medication overdose on 4/2. ( Seroquel?)  She was initially admitted to medical unit for stabilization. Patient reports she has been depressed and angry, which she attributes to family stressors. She states that her mother in law treats her poorly, criticizes her , and dis-empowers her often. States that when she speaks to her husband about this, " he takes her side ". States that on day of admission " I had just had it". Patient reports history of depression, prior suicide attempt. She  has been on different medications in the past . Of note, Dr. Marc Morgans, her outpatient psychiatrist, contacted me to provide collateral information- states patient has chronic mood disorder, bipolar disorder is suspected . Reports that patient may have become more symptomatic, labile with recent Fetzima trial.  He would  consider adding medication such as Oxcarbazepine or Carbamazepine ,which patient has not been on in the past . Patient reports she has been compliant with her medications - Buspar 30 mgrs BID, Lexapro 10 mgrs QDAY, Neurontin 1200 mgrs TID. She states she has already weaned herself off Fetzima.  She feels these medications have helped , but feels lexapro dose should be increased. Denies side effects.  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  Dx- Suicide Attempt, consider Bipolar Disorder , Depressed Plan - inpatient admission . Patient expresses some interest in having family meeting with her husband before her discharge from unit.  Continue Buspar 30 mgrs BID, continue Neurontin at 800 mgrs TID, increase Lexapro to 15 mgrs QDAY. We discussed adding Trileptal for mood disorder, at this  time patient concerned about potential side effects, wants to wait.  Continue Xarelto for history of  PE Continue Glucophage for history of DM   Musculoskeletal: Strength & Muscle Tone: within normal limits Gait & Station: normal Patient leans: N/A  Psychiatric Specialty Exam: Physical Exam  ROS denies chest pain or shortness of breath, no vomiting, no fever, no current bleeding reported   Blood pressure 108/80, pulse 80, temperature 97.6 F (36.4 C), resp. rate 20, height  (1.727 m), weight 113.4 kg (250 lb), SpO2 100 %.Body mass index is 38.01 kg/m.  General Appearance: Fairly Groomed  Eye Contact:  Good  Speech:  Normal Rate- not pressured   Volume:  Normal  Mood:  reports feeling depressed and angry  Affect:  constricted, but reactive, vaguely irritable  Thought Process:  Linear and Descriptions of Associations: Intact  Orientation:  Full (Time, Place, and Person)  Thought Content:  ruminative about family stressors as above, denies hallucinations, no delusions, not internally preoccupied   Suicidal Thoughts:  Yes.  without intent/plan describes passive SI, but denies any plan or intention of Suicide and contracts for safety on unit   Homicidal Thoughts:  No denies any homicidal ideations and specifically denies any homicidal ideations towards husband or mother in law  Memory:  recent and remote grossly intact   Judgement:  Fair  Insight:  Fair  Psychomotor Activity:  Normal  Concentration:  Concentration: Good and Attention Span: Good  Recall:  Good  Fund of Knowledge:  Good  Language:  Good  Akathisia:  Negative  Handed:  Right  AIMS (if indicated):     Assets:  Communication Skills Desire for Improvement Resilience  ADL's:  Intact  Cognition:  WNL  Sleep:         COGNITIVE FEATURES THAT CONTRIBUTE TO RISK:  Closed-mindedness and Loss of executive function    SUICIDE RISK:   Moderate:  Frequent suicidal ideation with limited intensity, and duration, some specificity in terms of plans, no associated intent, good self-control, limited  dysphoria/symptomatology, some risk factors present, and identifiable protective factors, including available and accessible social support.  PLAN OF CARE: Patient will be admitted to inpatient psychiatric unit for stabilization and safety. Will provide and encourage milieu participation. Provide medication management and maked adjustments as needed.  Will follow daily.    I certify that inpatient services furnished can reasonably be expected to improve the patient's condition.   Craige Cotta, MD 06/15/2016, 5:18 PM

## 2016-06-15 NOTE — [facility] Group Notes (Signed)
[facility] LCSW Aftercare Discharge Planning Group Note   06/15/2016 10:59 AM  Participation Quality: Pt invited. Did not attend.   Jonathon Jordan, MSW, Theresia Majors

## 2016-06-15 NOTE — [facility] Group Notes (Signed)
[facility] LCSW Group Therapy 06/15/2016 1:15pm  Type of Therapy: Group Therapy- Feelings Around Relapse and Recovery  Participation Level: Active   Participation Quality:  Appropriate  Affect:  Appropriate  Cognitive: Alert and Oriented   Insight:  Developing   Engagement in Therapy: Developing/Improving and Engaged   Modes of Intervention: Clarification, Confrontation, Discussion, Education, Exploration, Limit-setting, Orientation, Problem-solving, Rapport Building, Dance movement psychotherapist, Socialization and Support  Summary of Progress/Problems: The topic for today was feelings about relapse. The group discussed what relapse prevention is to them and identified triggers that they are on the path to relapse. Members also processed their feeling towards relapse and were able to relate to common experiences. Group also discussed coping skills that can be used for relapse prevention. Pt states that it is difficult for her to reach out for help when she is feeling down. Pt reports that she hasn't developed a relapse prevention plan yet but she is working on it.   Therapeutic Modalities:   Cognitive Behavioral Therapy Solution-Focused Therapy Assertiveness Training Relapse Prevention Therapy    Jonathon Jordan, MSW, Theresia Majors [phone] 06/15/2016 3:55 PM

## 2016-06-15 NOTE — Consult Note (Deleted)
[facility] Face-to-Face Psychiatry Consult   Reason for Consult:  Suicide attempt, overdose Referring Physician:  [facility] Patient Identification: Deborah Soto MRN:  [id number] Principal Diagnosis: Intentional drug overdose [facility]) with suicidal overdose Diagnosis:   Patient Active Problem List   Diagnosis Date Noted  . MDD (major depressive disorder), recurrent severe, without psychosis (HCC) [F33.2] 06/14/2016  . Intentional drug overdose (HCC) [T50.902A]   . Acute encephalopathy [G93.40] 06/11/2016  . Diabetes mellitus type 2 in obese (HCC) [E11.69, E66.9] 06/11/2016  . Suicidal ideation [R45.851] 06/11/2016  . Positive ANA (antinuclear antibody) [R76.8] 04/02/2016  . Hyperparathyroidism (HCC) [E21.3] 01/09/2016  . Snoring [R06.83] 09/20/2015  . 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] 05/12/2015  . Hepatic steatosis [K76.0] 04/18/2015  . Left sided chest pain [R07.9]   . Dyspnea [R06.00]   . Achilles tendonitis [M76.60] 04/08/2012  . 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] 04/08/2012  . PTE (pulmonary thromboembolism) (HCC) [I26.99] 10/10/2011  . Preeclampsia [O14.90] 10/10/2011  . Diverticulitis [K57.92] 10/10/2011  . Bipolar affective disorder (HCC) [F31.9] 10/10/2011  . BMI 34.0-34.9,adult [Z68.34] 10/10/2011  . AR (allergic rhinitis) [J30.9] 10/10/2011    Total Time spent with patient: 40 minutes  Subjective:   Deborah Soto is a 51 y.o. female patient admitted with reports of intentional overdose in suicide attempt. Pt seen and chart reviewed. Pt is alert/oriented x4, calm, cooperative, and appropriate to situation. Pt denies homicidal ideation and psychosis and does not appear to be responding to internal stimuli. Pt was extremely somnolent during interview and was able to be woken up but had to be awoken 5 times to complete the assessment. Pt reports 2 serious overdose attempts before this one and reports "I don't even know what I took or what happened but I  have been suicidal every day and very depressed for 15 years." Hospital SW will attempt to obtain collateral from pt's family to attempt to ascertain what pills may have been ingested yet disposition will remain the same: inpatient psychiatric hospitalization.   HPI:  I have reviewed and concur with HPI elements below, modified as follows;  "Deborah Soto is a 51 y.o. female with history of recently diagnosed diabetes mellitus type 2, sleep apnea, history of DVT and PE on xarelto and history of depression was brought to the ER after patient was found to be increasingly lethargic and drowsy by the husband. As per the husband patient has been depressed last 2 days and has been threatening suicide. Patient had taken her home medications yesterday. This morning when husband was planning to give the medications to her she refused. Since patient was looking more lethargic husband plan to work at home. In the afternoon patient was found walking in the kitchen and later on was found to be on the bed lethargic. Later in the evening around 4-5 PM husband called EMS and patient was brought to the ER."  Pt spent the night int he hospital without incident yet has been lethargic and somnolent. Seen today as above on 06/12/16 for psychiatric evaluation. Staff report that pt has been cooperative. Safety sitter in place due to high risk.  Past Psychiatric History: depression, anxiety  06/14/2016 Interval History: Patient seen with LCSW for this face to face psych consultation follow up. Patient and her husband who is at bed side reports not remembering seeing or talking with the Renata Caprice, NP and requesting to see this provider. They also concern about IVC when she has been not comprehending her mental status and try to leave the hospital.  Today she is more awake, alert, oriented x 3. She does endorses chronic history of depression and recent intentional drug overdose as a suicide attempt. She has been sad, tearful, dysphoric  with reactive mood. She has no evidence of psychosis. Reportedly she has taken whole bottle of PRN medication Klonopin but mistake states clonidine. She does not have a script for clonidine. She and her husband also asked to contact her primary psychiatrist and collaborate her care because of multiple medication treatments over the year and also history of ECT, several years ago - not helpful and caused memory loss. She has multiple psychosocial stresses. She was previously admitted to [facility] about 10 years ago.   Risk to Self: Is patient at risk for suicide?: Yes Risk to Others:   Prior Inpatient Therapy:   Prior Outpatient Therapy:    Past Medical History:  Past Medical History:  Diagnosis Date  . Anxiety   . Asthma   . Depression   . Diabetes mellitus type 2 in obese (HCC) 06/11/2016  . Embolism - blood clot January 1997 & January 2017   Also had LLE DVT in 1997  . Hypertension   . Sleep apnea     Past Surgical History:  Procedure Laterality Date  . NO PAST SURGERIES     Family History:  Family History  Problem Relation Age of Onset  . Dementia Father   . Heart disease Father   . Clotting disorder Mother   . Arthritis Mother   . Clotting disorder Sister   . Clotting disorder Maternal Grandmother   . Clotting disorder Maternal Aunt   . Rheumatologic disease Neg Hx   . Hyperparathyroidism Neg Hx    Family Psychiatric  History: denies Social History:  History  Alcohol Use  . 0.6 oz/week  . 1 Cans of beer per week    Comment: 1-2 times a week     History  Drug Use No    Social History   Social History  . Marital status: Married    Spouse name: N/A  . Number of children: Y  . Years of education: N/A   Occupational History  . admin assist    Social History Main Topics  . Smoking status: Former Smoker    Packs/day: 1.00    Years: 20.00    Quit date: 03/12/2004  . Smokeless tobacco: Never Used  . Alcohol use 0.6 oz/week    1 Cans of beer per week     Comment:  1-2 times a week  . Drug use: No  . Sexual activity: Not on file   Other Topics Concern  . Not on file   Social History Narrative   Originally from [place]. Always lived in [place]. 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 No international travel recently. Previously has been to [place] in May 1996. No mold exposure recently. No bird exposure. Does have multiple pets.    Additional Social History:    Allergies:   Allergies  Allergen Reactions  . Tramadol Other (See Comments)    Tingling all over     Labs:  Results for orders placed or performed during the hospital encounter of 06/14/16 (from the past 48 hour(s))  Glucose, capillary     Status: Abnormal   Collection Time: 06/14/16  7:40 PM  Result Value Ref Range   Glucose-Capillary 197 (H) 65 - 99 mg/dL   Comment 1 Notify RN   Glucose, capillary     Status: Abnormal   Collection Time:  06/15/16  7:38 AM  Result Value Ref Range   Glucose-Capillary 210 (H) 65 - 99 mg/dL    Current Facility-Administered Medications  Medication Dose Route Frequency Provider Last Rate Last Dose  . acetaminophen (TYLENOL) tablet 650 mg  650 mg Oral Q6H PRN Laveda Abbe, NP      . alum & mag hydroxide-simeth (MAALOX/MYLANTA) 200-200-20 MG/5ML suspension 30 mL  30 mL Oral Q4H PRN Laveda Abbe, NP      . busPIRone (BUSPAR) tablet 30 mg  30 mg Oral BID Laveda Abbe, NP   30 mg at 06/15/16 1610  . escitalopram (LEXAPRO) tablet 10 mg  10 mg Oral QHS Laveda Abbe, NP   10 mg at 06/14/16 2107  . gabapentin (NEURONTIN) capsule 600 mg  600 mg Oral TID Laveda Abbe, NP   600 mg at 06/15/16 9604  . hydrOXYzine (ATARAX/VISTARIL) tablet 25 mg  25 mg Oral Q6H PRN Laveda Abbe, NP   25 mg at 06/14/16 2107  . magnesium hydroxide (MILK OF MAGNESIA) suspension 30 mL  30 mL Oral Daily PRN Laveda Abbe, NP      . menthol-cetylpyridinium (CEPACOL) lozenge 3 mg  1 lozenge Oral PRN Laveda Abbe, NP      .  metFORMIN (GLUCOPHAGE) tablet 500 mg  500 mg Oral BID WC Laveda Abbe, NP   500 mg at 06/15/16 5409  . rivaroxaban (XARELTO) tablet 20 mg  20 mg Oral Q supper Laveda Abbe, NP      . traZODone (DESYREL) tablet 50 mg  50 mg Oral QHS PRN Laveda Abbe, NP   50 mg at 06/14/16 2107    Musculoskeletal: Strength & Muscle Tone: decreased Gait & Station: in bed Patient leans: Backward  Psychiatric Specialty Exam: Physical Exam  ROS  Blood pressure 108/80, pulse 80, temperature 97.6 F (36.4 C), resp. rate 20, height  (1.727 m), weight 113.4 kg (250 lb), SpO2 100 %.Body mass index is 38.01 kg/m.  General Appearance: Casual and Fairly Groomed  Eye Contact:  Minimal  Speech:  Clear and Coherent and Slow  Volume:  Decreased  Mood:  Depressed  Affect:  Appropriate, Congruent and Depressed  Thought Process:  Goal Directed, Linear and Descriptions of Associations: Loose  Orientation:  Full (Time, Place, and Person)  Thought Content:  Focused on wanting to sleep  Suicidal Thoughts:  Yes.  with intent/plan  Homicidal Thoughts:  No  Memory:  Immediate;   Fair Recent;   Fair Remote;   Fair  Judgement:  Fair  Insight:  Fair  Psychomotor Activity:  Normal  Concentration:  Concentration: Fair and Attention Span: Fair  Recall:  [employer] of Knowledge:  Fair  Language:  Fair  Akathisia:  No  Handed:    AIMS (if indicated):     Assets:  Communication Skills Desire for Improvement Resilience Social Support  ADL's:  Intact  Cognition:  WNL  Sleep:      Treatment Plan Summary: Patient with history of Major depressive disorder, recurrent, severe without psychotic symptoms admitted for increased symptoms of depression, anxiety and intentional unknown amount of klonopin overdose with intention to end her life.   She can not contract for safety and required Recruitment consultant Intentional drug overdose [facility])  Case discussed with patient, patient husband with her consent  and LCSW. Patient meets criteria for acute inpatient psychiatric hospitalization for safety and stabilization once medically cleared Patient may needs IVC if refuses psych  admission or trying to elope from the hospital Patient and her husband wishes to be admitted to [facility] as voluntary admisison Recommend no psychotropic medication until she is medically stable Please have your hospital SW to reach out to family for collateral.   Disposition: Recommend psychiatric Inpatient admission when medically cleared. Supportive therapy provided about ongoing stressors.  Leata Mouse, MD 06/15/2016 9:52 AM

## 2016-06-15 NOTE — H&P (Addendum)
Psychiatric Admission Assessment Adult  Patient Identification: Deborah Soto  MRN:  [id number]  Date of Evaluation:  06/15/2016  Chief Complaint: Suicide attempt by overdose.  Principal Diagnosis: Major depressive disorder, recurrent episodes, severe. Diagnosis:   Patient Active Problem List   Diagnosis Date Noted   MDD (major depressive disorder), recurrent severe, without psychosis ([place]) [F33.2] 06/14/2016    Priority: High   Intentional drug overdose ([place]) [T50.902A]    Acute encephalopathy [G93.40] 06/11/2016   Diabetes mellitus type 2 in obese ([place]) [E11.69, E66.9] 06/11/2016   Suicidal ideation [R45.851] 06/11/2016   Positive ANA (antinuclear antibody) [R76.8] 04/02/2016   Hyperparathyroidism ([place]) [E21.3] 01/09/2016   Snoring [R06.83] 09/20/2015   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] 05/12/2015   Hepatic steatosis [K76.0] 04/18/2015   Left sided chest pain [R07.9]    Dyspnea [R06.00]    Achilles tendonitis [M76.60] 04/08/2012   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 She is being admitted to the [facility] adult unit from the [facility] with complaints of suicide attempt by overdose on Klonopin. She is here for mood stabilization treatments. During this admission assessment, Deborah Soto reports, "I think I was taken to the ED by an ambulance last Monday. My husband called the EMS when he was unable to wake me up. I can't remember all that happened. However, I had taken a whole bottle of Klonopin to  kill myself. There have been some things going on in my life. I had an argument with my husband & kids prior to the overdose. I have been depressed x 12 years. This is my third psychiatric hospitalization. I have had ECT about 4 years ago. It was a horrible experience. It did not help the depression or improve my mood. It caused me memory issues, I could not remember a lot of things. I was diagnosed with depression in high school at the age of 51. That time, I cut my wrist. I had a hard time growing up. Nothing I did is good enough for my parents. I was never allowed to socialize with the other kids or allowed to go to church. I had attempted suicide x 5 in my life time. I was recently diagnosed with sleep apnea & Diabetes. I think these helped worsen my mood. I'm receiving Mental health care at the [facility]. I have been tried on all the antidepressants out there. I'm currently on Lexapro & Buspar".  Associated Signs/Symptoms: Depression Symptoms:  depressed mood, insomnia, feelings of worthlessness/guilt, hopelessness, recurrent thoughts of death, anxiety,  (Hypo) Manic Symptoms:  Impulsivity,  Anxiety Symptoms:  Excessive Worry,  Psychotic Symptoms:   Denies any hallcuinations, delusional thoughts or paranoia.  PTSD Symptoms:  None reported  Total Time spent with patient: 1 hour  Past Psychiatric History: Major depressive disorder, recurrent episodes.  Is the patient at risk to self? No.  Has the patient been a risk to self in the past 6 months? Yes.    Has the patient been a risk to self within the distant past? Yes.    Is the patient a risk to others? No.  Has  the patient been a risk to others in the past 6 months? No.  Has the patient been a risk to others within the distant past? No.   Prior Inpatient Therapy:   Prior Outpatient Therapy:    Alcohol Screening: 1. How often do you have a drink containing alcohol?: Never  Substance Abuse History in the last 12 months:   No.  Consequences of Substance Abuse: NA  Previous Psychotropic Medications: Yes (Lexapro, Buspar, Klonopin)  Psychological Evaluations: No   Past Medical History:  Past Medical History:  Diagnosis Date   Anxiety    Asthma    Depression    Diabetes mellitus type 2 in obese ([place]) 06/11/2016   Embolism - blood clot January 1997 & January 2017   Also had LLE DVT in 1997   Hypertension    Sleep apnea     Past Surgical History:  Procedure Laterality Date   NO PAST SURGERIES     Family History:  Family History  Problem Relation Age of Onset   Dementia Father    Heart disease Father    Clotting disorder Mother    Arthritis Mother    Clotting disorder Sister    Clotting disorder Maternal Grandmother    Clotting disorder Maternal Aunt    Rheumatologic disease Neg Hx    Hyperparathyroidism Neg Hx    Family Psychiatric  History: Major depression: Mother.  Tobacco Screening: Have you used any form of tobacco in the last 30 days? (Cigarettes, Smokeless Tobacco, Cigars, and/or Pipes): No  Social History:  History  Alcohol Use   0.6 oz/week   1 Cans of beer per week    Comment: 1-2 times a week     History  Drug Use No    Additional Social History: History of alcohol / drug use?: No history of alcohol / drug abuse  Allergies:   Allergies  Allergen Reactions   Tramadol Other (See Comments)    Tingling all over    Lab Results:  Results for orders placed or performed during the hospital encounter of 06/14/16 (from the past 48 hour(s))  Glucose, capillary     Status: Abnormal   Collection Time: 06/14/16  7:40 PM  Result Value Ref Range   Glucose-Capillary 197 (H) 65 - 99 mg/dL   Comment 1 Notify RN   Glucose, capillary     Status: Abnormal   Collection Time: 06/15/16  7:38 AM  Result Value Ref Range   Glucose-Capillary 210 (H) 65 - 99 mg/dL  Glucose, capillary     Status: Abnormal   Collection Time: 06/15/16 12:09 PM  Result Value Ref Range   Glucose-Capillary  115 (H) 65 - 99 mg/dL   Comment 1 Notify RN    Blood Alcohol level:  Lab Results  Component Value Date   ETH <5 01/75/1025   Metabolic Disorder Labs:  Lab Results  Component Value Date   HGBA1C 7.6 (H) 05/30/2016   No results found for: PROLACTIN Lab Results  Component Value Date   CHOL 203 (H) 10/10/2011   TRIG 123 10/10/2011   HDL 48 10/10/2011   CHOLHDL 4.2 10/10/2011   VLDL 25 10/10/2011   LDLCALC 130 (H) 10/10/2011   Current Medications: Current Facility-Administered Medications  Medication Dose Route Frequency Provider Last Rate Last Dose   acetaminophen (TYLENOL) tablet 650 mg  650 mg Oral Q6H PRN Ethelene Hal, NP       alum & mag hydroxide-simeth (MAALOX/MYLANTA) 200-200-20 MG/5ML suspension 30 mL  30 mL  Oral Q4H PRN Ethelene Hal, NP       busPIRone (BUSPAR) tablet 30 mg  30 mg Oral BID Ethelene Hal, NP   30 mg at 06/15/16 9417   escitalopram (LEXAPRO) tablet 10 mg  10 mg Oral QHS Ethelene Hal, NP   10 mg at 06/14/16 2107   gabapentin (NEURONTIN) capsule 1,200 mg  1,200 mg Oral TID Encarnacion Slates, NP       hydrOXYzine (ATARAX/VISTARIL) tablet 25 mg  25 mg Oral Q6H PRN Ethelene Hal, NP   25 mg at 06/14/16 2107   magnesium hydroxide (MILK OF MAGNESIA) suspension 30 mL  30 mL Oral Daily PRN Ethelene Hal, NP       menthol-cetylpyridinium (CEPACOL) lozenge 3 mg  1 lozenge Oral PRN Ethelene Hal, NP       [START ON 06/16/2016] metFORMIN (GLUCOPHAGE) tablet 1,000 mg  1,000 mg Oral Q breakfast Encarnacion Slates, NP       metFORMIN (GLUCOPHAGE) tablet 500 mg  500 mg Oral QPM Encarnacion Slates, NP       rivaroxaban (XARELTO) tablet 20 mg  20 mg Oral Q supper Ethelene Hal, NP       traZODone (DESYREL) tablet 50 mg  50 mg Oral QHS PRN Ethelene Hal, NP   50 mg at 06/14/16 2107   PTA Medications: Prescriptions Prior to Admission  Medication Sig Dispense Refill Last Dose   blood glucose meter kit and supplies KIT Dispense  based on patient and insurance preference. Use up to four times daily as directed. (FOR ICD-9 250.00, 250.01). 1 each 0 Taking   busPIRone (BUSPAR) 30 MG tablet Take 30 mg by mouth 2 (two) times daily.   06/10/2016 at Unknown time   Cholecalciferol (VITAMIN D3) 5000 units CAPS Take 10,000 Units by mouth daily.    Past Week at Unknown time   Cyanocobalamin (VITAMIN B-12 CR PO) Take 2 tablets by mouth daily.    Past Week at Unknown time   escitalopram (LEXAPRO) 10 MG tablet Take 10 mg by mouth at bedtime.    06/10/2016 at Unknown time   gabapentin (NEURONTIN) 600 MG tablet Take 2 tablets (1,200 mg total) by mouth 3 (three) times daily. 180 tablet 1 06/10/2016 at Unknown time   lidocaine (LIDODERM) 5 % Place 1 patch onto the skin daily. Remove & Discard patch within 12 hours or as directed by MD 30 patch 0 Past Week at Unknown time   MAGNESIUM PO Take 1 tablet by mouth daily.   Past Week at Unknown time   metFORMIN (GLUCOPHAGE) 500 MG tablet Take 1 tablet (500 mg total) by mouth 2 (two) times daily with a meal. Initial 1 tablet for the first week. (Patient taking differently: Take 500-1,000 mg by mouth 2 (two) times daily with a meal. Take 1 tablet in the am and Take 2 tablets in the evening.) 60 tablet 1 06/10/2016 at Unknown time   methocarbamol (ROBAXIN-750) 750 MG tablet Take 1 tablet (750 mg total) by mouth every 8 (eight) hours as needed for muscle spasms. 90 tablet 1 Past Month at Unknown time   ONETOUCH VERIO test strip CHECK BLOOD SUGAR UP TO 4 TIMES A DAY AS DIRECTED. 100 each 3 Taking   XARELTO 20 MG TABS tablet TAKE 1 TABLET DAILY WITH SUPPER. 90 tablet 1 06/10/2016 at 2030   Musculoskeletal: Strength & Muscle Tone: within normal limits Gait & Station: normal Patient leans: N/A  Psychiatric Specialty  Exam: Physical Exam  Constitutional: She is oriented to person, place, and time. She appears well-developed.  Obese  HENT:  Head: Normocephalic.  Eyes: Pupils are equal, round, and reactive to  light.  Neck: Normal range of motion.  Cardiovascular: Normal rate.   Respiratory: Effort normal.  Hx. Sleep Apnea. Uses C-Pap machine at bedtime.  GI: Soft.  Genitourinary:  Genitourinary Comments: Deferred  Musculoskeletal: Normal range of motion.  Neurological: She is alert and oriented to person, place, and time.  Skin: Skin is warm and dry.    Review of Systems  Constitutional: Negative.   HENT: Negative.   Eyes: Negative.   Respiratory: Negative.   Cardiovascular: Negative.   Genitourinary: Negative.   Musculoskeletal: Positive for joint pain and myalgias.  Skin: Negative.   Neurological: Negative.   Endo/Heme/Allergies: Negative.  Bruises/bleeds easily: Hx. PE, Blood clot, on Xarelto.  Psychiatric/Behavioral: Positive for depression. Negative for hallucinations, memory loss, substance abuse and suicidal ideas. The patient is nervous/anxious and has insomnia.     Blood pressure 108/80, pulse 80, temperature 97.6 F (36.4 C), resp. rate 20, height 5\' 8"  (1.727 m), weight 113.4 kg (250 lb), SpO2 100 %.Body mass index is 38.01 kg/m.  General Appearance: Casual and Obese  Eye Contact:  Good  Speech:  Clear and Coherent and Normal Rate  Volume:  Normal  Mood:  Anxious, Depressed and Hopeless  Affect:  Congruent  Thought Process:  Coherent and Goal Directed  Orientation:  Full (Time, Place, and Person)  Thought Content:  Rumination and denies any hallucinations, delusional thoughts or paranoia.  Suicidal Thoughts:   Currently denies any thoughts, plans or intent.  Homicidal Thoughts:   Currently denies any thoughts, plans or intent.  Memory:  Immediate;   Good Recent;   Good Remote;   Good  Judgement:  Fair  Insight:  Present  Psychomotor Activity:  Normal  Concentration:  Concentration: Good and Attention Span: Good  Recall:  Good  Fund of Knowledge:  Good  Language:  Good  Akathisia:  Negative  Handed:  Right  AIMS (if indicated):     Assets:  Communication  Skills Desire for Improvement Social Support  ADL's:  Intact  Cognition:  WNL  Sleep:      Treatment Plan/Recommendations: 1. Admit for crisis management and stabilization, estimated length of stay 3-5 days.  2. Medication management to reduce current symptoms to base line and improve the patient's overall level of functioning: See MAR & Md's SRA, Tx plan.  3. Treat health problems as indicated.  4. Develop treatment plan to decrease risk of relapse upon discharge and the need for readmission.  5. Psycho-social education regarding relapse prevention and self care.  6. Health care follow up as needed for medical problems.  7. Review, reconcile, and reinstate any pertinent home medications for other health issues where appropriate. 8. Call for consults with hospitalist for any additional specialty patient care services as needed.  Observation Level/Precautions:  15 minute checks  Laboratory:   Per ED UDS + for Benzodiazepine  Psychotherapy: Group sessions   Medications: See MAR   Consultations: As needed   Discharge Concerns: Safety, Mood stability   Estimated LOS: 3-5 days  Other: Admit to the [facility]    Physician Treatment Plan for Primary Diagnosis: Intentional drug overdose (HCC)  Long Term Goal(s): Improvement in symptoms so as ready for discharge  Short Term Goals: Ability to identify changes in lifestyle to reduce recurrence of condition will improve, Ability to verbalize feelings  will improve, Ability to disclose and discuss suicidal ideas and Ability to demonstrate self-control will improve  Physician Treatment Plan for Secondary Diagnosis: Principal Problem:   Intentional drug overdose ([place]) Active Problems:   MDD (major depressive disorder), recurrent severe, without psychosis ([place])  Long Term Goal(s): Improvement in symptoms so as ready for discharge  Short Term Goals: Ability to identify and develop effective coping behaviors will improve, Compliance with prescribed  medications will improve and Ability to identify triggers associated with substance abuse/mental health issues will improve  I certify that inpatient services furnished can reasonably be expected to improve the patient's condition.    Encarnacion Slates, NP, PMHNP, FNP-BC 4/6/20181:03 PM   I have reviewed case with NP and have met with patient  Agree with NP note Patient is a 43 year old married female- lives with husband, mother in law, and daughter. She reports history of depression.  Patient was admitted following medication overdose on 4/2. ( Seroquel?)  She was initially admitted to medical unit for stabilization. Patient reports she has been depressed and angry, which she attributes to family stressors. She states that her mother in law treats her poorly, criticizes her , and dis-empowers her often. States that when she speaks to her husband about this, " he takes her side ". States that on day of admission " I had just had it". Patient reports history of depression, prior suicide attempt. She  has been on different medications in the past . Of note, Dr. Curt Jews, her outpatient psychiatrist, contacted me to provide collateral information- states patient has chronic mood disorder, bipolar disorder is suspected . Reports that patient may have become more symptomatic, labile with recent Fetzima trial.  He would  consider adding medication such as Oxcarbazepine or Carbamazepine ,which patient has not been on in the past . Patient reports she has been compliant with her medications - Buspar 30 mgrs BID, Lexapro 10 mgrs QDAY, Neurontin 1200 mgrs TID. She states she has already weaned herself off [place].  She feels these medications have helped , but feels lexapro dose should be increased. Denies side effects.  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  Dx- Suicide Attempt, consider Bipolar Disorder , Depressed Plan - inpatient admission . Patient expresses some interest in  having family meeting with her husband before her discharge from unit.  Continue Buspar 30 mgrs BID, continue Neurontin at 800 mgrs TID, increase Lexapro to 15 mgrs QDAY. We discussed adding Trileptal for mood disorder, at this time patient concerned about potential side effects, wants to wait.  Continue Xarelto for history of PE Continue Glucophage for history of DM   09/09/2020 ADDENDUM Contacted by Medical Records . Patient has submitted a request to addend medical records pertaining to  diagnostic considerations for this inpatient admission, reporting correct diagnosis is MDD , rather than Bipolar Disorder . Chart reviewed :at time of admission , Dr Clovis Pu, her outpatient psychiatrist,provided information- he reported bipolarity suspected, with recent increased affective lability on Fetzima. She was discharged on Cymbalta , with recommendation of continuing outpatient management with Dr Clovis Pu, whom she has continued to follow up with. I note as per chart review, that diagnosis listed for these  visits is MDD.

## 2016-06-15 NOTE — Progress Notes (Signed)
Adult Psychoeducational Group Note  Date:  06/15/2016 Time:  10:49 PM  Group Topic/Focus:  Wrap-Up Group:   The focus of this group is to help patients review their daily goal of treatment and discuss progress on daily workbooks.  Participation Level:  Active  Participation Quality:  Appropriate and Attentive  Affect:  Appropriate  Cognitive:  Alert, Appropriate and Oriented  Insight: Appropriate  Engagement in Group:  Engaged  Modes of Intervention:  Discussion  Additional Comments:  Pt attended and participated in group. Pt stated she is here due to a failed suicide attempt and reported that she spent her day working on turning her depressive thoughts into positive ones. Pt rated her day a 6 or 7/10 with 10 being the best.   Deborah Soto 06/15/2016, 10:49 PM

## 2016-06-16 LAB — GLUCOSE, CAPILLARY
Glucose-Capillary: 103 mg/dL — ABNORMAL HIGH (ref 65–99)
Glucose-Capillary: 100 mg/dL — ABNORMAL HIGH (ref 65–99)
Glucose-Capillary: 114 mg/dL — ABNORMAL HIGH (ref 65–99)

## 2016-06-16 LAB — CULTURE, GROUP A STREP (THRC)

## 2016-06-16 MED ORDER — BUSPIRONE HCL 15 MG PO TABS: 15.0000 mg | ORAL_TABLET | Freq: Every morning | ORAL | Status: DC

## 2016-06-16 MED ORDER — BUSPIRONE HCL 15 MG PO TABS
15.0000 mg | ORAL_TABLET | Freq: Every morning | ORAL | Status: DC
  Administered 2016-06-17 – 2016-06-20 (×4): 15 mg via ORAL
  Filled 2016-06-16 (×6): qty 1

## 2016-06-16 MED ORDER — BUSPIRONE HCL 15 MG PO TABS
30.0000 mg | ORAL_TABLET | Freq: Every day | ORAL | Status: DC
  Administered 2016-06-17 – 2016-06-19 (×3): 30 mg via ORAL
  Filled 2016-06-16 (×6): qty 2

## 2016-06-16 MED ORDER — BUSPIRONE HCL 15 MG PO TABS: 30.0000 mg | ORAL_TABLET | Freq: Every day | ORAL | Status: DC

## 2016-06-16 NOTE — [facility] Group Notes (Signed)
Identifying Needs   Date:  06/16/2016  Time:  1100  Type of Therapy:  Nurse Education  /  Identifying Needs :  The group focuses on teaching patients how to identify their needs as well as how to develop the skills needed to get them met.  Participation Level:  Active  Participation Quality:  Attentive  Affect:  Appropriate  Cognitive:  Alert  Insight:  Good  Engagement in Group:  Engaged  Modes of Intervention:  Education  Summary of Progress/Problems:  Deborah Soto 06/16/2016, 1:19 PM

## 2016-06-16 NOTE — [facility] Group Notes (Signed)
Adult Therapy Group Note  Date:  06/16/2016  Time:  10:00-11:00AM  Group Topic/Focus: Fears and Healthy/Unhealthy Coping Skills  Building Self Esteem:   The Focus of this group was to discuss some of the prevalent fears that patients experience, and to identify the commonalities among group members.  An exercise was used to initiate the discussion, followed by writing on the white board a group-generated list of unhealthy coping and healthy coping techniques to deal with each fear.    Participation Level:  Active  Participation Quality:  Attentive, Sharing and Supportive  Affect:  Depressed  Cognitive:  Appropriate  Insight: Good  Engagement in Group:  Engaged  Modes of Intervention:  Discussion, Exploration and Support  Additional Comments:  The patient expressed that her fear is that after having good supports while in the hospital, she will be going home to once again have no supports, and that she will start to isolate herself and feel worthless again.  She gave good support and feedback to other patients throughout group, raised relevant questions.  Ambrose Mantle, LCSW 06/16/2016   12:16 PM

## 2016-06-16 NOTE — [facility] Group Notes (Addendum)
Identifying Emotional Energy   Date:  06/16/2016  Time:  0900  Type of Therapy:  Nurse Education  ;  The group focused on teaching patients how to identify their emotional energy level.   Participation Level:  Active  Participation Quality:  Attentive  Affect:  Appropriate  Cognitive:  Alert  Insight:  Good  Engagement in Group:  Engaged  Modes of Intervention:  Education  Summary of Progress/Problems:  Rich Brave 06/16/2016, 10:39 AM

## 2016-06-16 NOTE — Progress Notes (Addendum)
[facility] MD Progress Note  06/16/2016 2:18 PM Deborah Soto  MRN:  [id number] Subjective: patient states she is feeling partially better today. She states she feels somewhat tired, but less depressed, and today less irritable or angry. She has tolerated Lexapro titration well thus far. Objective : I have discussed case with treatment team and have met with patient. Patient is presenting with improving mood . Affect is reactive , smiles at times appropriately. She remains ruminative about family related stressors. Denies any current suicidal ideations. No disruptive or agitated behaviors on unit. We discussed medications further- states that Neurontin and Buspar have helped her anxiety partially, but is concerned about being on " high doses"and hopes to taper down doses. Of note, denies side effects. Yesterday/today we also reviewed potential Cymbalta trial, in order to address depression and help address chronic pain, but at present we agreed to continue to monitor on Lexapro , which was titrated up yesterday. She states " I am not sure I have bipolar disorder", and at this time prefers to wait until she sees Dr. Curt Jews again to review whether to add a mood stabilizer .  Principal Problem: Intentional drug overdose ([place]) Diagnosis:   Patient Active Problem List   Diagnosis Date Noted  . MDD (major depressive disorder), recurrent severe, without psychosis ([place]) [F33.2] 06/14/2016  . Intentional drug overdose ([place]) [T50.902A]   . Acute encephalopathy [G93.40] 06/11/2016  . Diabetes mellitus type 2 in obese ([place]) [E11.69, E66.9] 06/11/2016  . Suicidal ideation [R45.851] 06/11/2016  . Positive ANA (antinuclear antibody) [R76.8] 04/02/2016  . Hyperparathyroidism ([place]) [E21.3] 01/09/2016  . Snoring [R06.83] 09/20/2015  . 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] 05/12/2015  . Hepatic steatosis [K76.0] 04/18/2015  . Left sided chest pain [R07.9]   . Dyspnea [R06.00]    . Achilles tendonitis [M76.60] 04/08/2012  . 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] 04/08/2012  . PTE (pulmonary thromboembolism) ([place]) [I26.99] 10/10/2011  . Preeclampsia [O14.90] 10/10/2011  . Diverticulitis [K57.92] 10/10/2011  . Bipolar affective disorder ([place]) [F31.9] 10/10/2011  . BMI 34.0-34.9,adult [Z68.34] 10/10/2011  . AR (allergic rhinitis) [J30.9] 10/10/2011   Total Time spent with patient: 20 minutes  Past Medical History:  Past Medical History:  Diagnosis Date  . Anxiety   . Asthma   . Depression   . Diabetes mellitus type 2 in obese ([place]) 06/11/2016  . Embolism - blood clot January 1997 & January 2017   Also had LLE DVT in 1997  . Hypertension   . Sleep apnea     Past Surgical History:  Procedure Laterality Date  . NO PAST SURGERIES     Family History:  Family History  Problem Relation Age of Onset  . Dementia Father   . Heart disease Father   . Clotting disorder Mother   . Arthritis Mother   . Clotting disorder Sister   . Clotting disorder Maternal Grandmother   . Clotting disorder Maternal Aunt   . Rheumatologic disease Neg Hx   . Hyperparathyroidism Neg Hx    Social History:  History  Alcohol Use  . 0.6 oz/week  . 1 Cans of beer per week    Comment: 1-2 times a week     History  Drug Use No    Social History   Social History  . Marital status: Married    Spouse name: N/A  . Number of children: Y  . Years of education: N/A   Occupational History  . admin assist    Social History Main Topics  .  Smoking status: Former Smoker    Packs/day: 1.00    Years: 20.00    Quit date: 03/12/2004  . Smokeless tobacco: Never Used  . Alcohol use 0.6 oz/week    1 Cans of beer per week     Comment: 1-2 times a week  . Drug use: No  . Sexual activity: Not Asked   Other Topics Concern  . None   Social History Narrative   Originally from [place]. Always lived in [place]. 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 No international travel recently. Previously has  been to [place] in May 1996. No mold exposure recently. No bird exposure. Does have multiple pets.    Additional Social History:    History of alcohol / drug use?: No history of alcohol / drug abuse    Sleep: improved  Appetite:  good  Current Medications: Current Facility-Administered Medications  Medication Dose Route Frequency Provider Last Rate Last Dose  . acetaminophen (TYLENOL) tablet 650 mg  650 mg Oral Q6H PRN Ethelene Hal, NP      . alum & mag hydroxide-simeth (MAALOX/MYLANTA) 200-200-20 MG/5ML suspension 30 mL  30 mL Oral Q4H PRN Ethelene Hal, NP      . busPIRone (BUSPAR) tablet 30 mg  30 mg Oral BID Ethelene Hal, NP   30 mg at 06/16/16 0809  . escitalopram (LEXAPRO) tablet 15 mg  15 mg Oral QHS Jenne Campus, MD   15 mg at 06/15/16 2202  . gabapentin (NEURONTIN) capsule 800 mg  800 mg Oral TID Jenne Campus, MD   800 mg at 06/16/16 1347  . hydrOXYzine (ATARAX/VISTARIL) tablet 25 mg  25 mg Oral Q6H PRN Ethelene Hal, NP   25 mg at 06/15/16 2211  . magnesium hydroxide (MILK OF MAGNESIA) suspension 30 mL  30 mL Oral Daily PRN Ethelene Hal, NP      . menthol-cetylpyridinium (CEPACOL) lozenge 3 mg  1 lozenge Oral PRN Ethelene Hal, NP      . metFORMIN (GLUCOPHAGE) tablet 1,000 mg  1,000 mg Oral Q breakfast Encarnacion Slates, NP   1,000 mg at 06/16/16 0810  . metFORMIN (GLUCOPHAGE) tablet 500 mg  500 mg Oral QPM Encarnacion Slates, NP   500 mg at 06/15/16 1701  . rivaroxaban (XARELTO) tablet 20 mg  20 mg Oral Q supper Ethelene Hal, NP   20 mg at 06/15/16 1701  . traZODone (DESYREL) tablet 50 mg  50 mg Oral QHS PRN Ethelene Hal, NP   50 mg at 06/15/16 2211    Lab Results:  Results for orders placed or performed during the hospital encounter of 06/14/16 (from the past 48 hour(s))  Glucose, capillary     Status: Abnormal   Collection Time: 06/14/16  7:40 PM  Result Value Ref Range   Glucose-Capillary 197 (H) 65 - 99 mg/dL    Comment 1 Notify RN   Glucose, capillary     Status: Abnormal   Collection Time: 06/15/16  7:38 AM  Result Value Ref Range   Glucose-Capillary 210 (H) 65 - 99 mg/dL  Glucose, capillary     Status: Abnormal   Collection Time: 06/15/16 12:09 PM  Result Value Ref Range   Glucose-Capillary 115 (H) 65 - 99 mg/dL   Comment 1 Notify RN   Glucose, capillary     Status: Abnormal   Collection Time: 06/15/16  4:51 PM  Result Value Ref Range   Glucose-Capillary 127 (H) 65 - 99  mg/dL   Comment 1 Notify RN   Glucose, capillary     Status: Abnormal   Collection Time: 06/15/16  9:30 PM  Result Value Ref Range   Glucose-Capillary 117 (H) 65 - 99 mg/dL  Glucose, capillary     Status: Abnormal   Collection Time: 06/16/16 11:50 AM  Result Value Ref Range   Glucose-Capillary 103 (H) 65 - 99 mg/dL    Blood Alcohol level:  Lab Results  Component Value Date   ETH <5 06/11/2016    Metabolic Disorder Labs: Lab Results  Component Value Date   HGBA1C 7.6 (H) 05/30/2016   No results found for: PROLACTIN Lab Results  Component Value Date   CHOL 203 (H) 10/10/2011   TRIG 123 10/10/2011   HDL 48 10/10/2011   CHOLHDL 4.2 10/10/2011   VLDL 25 10/10/2011   LDLCALC 130 (H) 10/10/2011    Physical Findings: AIMS: Facial and Oral Movements Muscles of Facial Expression: None, normal Lips and Perioral Area: None, normal Jaw: None, normal Tongue: None, normal,Extremity Movements Upper (arms, wrists, hands, fingers): None, normal Lower (legs, knees, ankles, toes): None, normal, Trunk Movements Neck, shoulders, hips: None, normal, Overall Severity Severity of abnormal movements (highest score from questions above): None, normal Incapacitation due to abnormal movements: None, normal Patient's awareness of abnormal movements (rate only patient's report): No Awareness, Dental Status Current problems with teeth and/or dentures?: No Does patient usually wear dentures?: No  CIWA:    COWS:      Musculoskeletal: Strength & Muscle Tone: within normal limits Gait & Station: normal Patient leans: N/A  Psychiatric Specialty Exam: Physical Exam  ROS no chest pain, no shortness of breath, no vomiting , denies any bleeding or abnormal bruising   Blood pressure 108/80, pulse 80, temperature 97.6 F (36.4 C), resp. rate 20, height 5\' 8"  (1.727 m), weight 113.4 kg (250 lb), SpO2 100 %.Body mass index is 38.01 kg/m.  General Appearance: improved grooming   Eye Contact:  Good  Speech:  Normal Rate  Volume:  Normal  Mood:  reports mood improved today compared to admission  Affect:  Appropriate and more reactive   Thought Process:  Linear and Descriptions of Associations: Intact  Orientation:  Full (Time, Place, and Person)  Thought Content:  denies hallucinations, no delusions, not internally preoccupied   Suicidal Thoughts:  No today denies any suicidal or self injurious ideations, denies any homicidal ideations, contracts for safety on unit   Homicidal Thoughts:  No  Memory:  recent and remote grossly intact   Judgement:  Other:  improving   Insight:  improving  Psychomotor Activity:  Normal  Concentration:  Concentration: Good and Attention Span: Good  Recall:  Good  Fund of Knowledge:  Good  Language:  Good  Akathisia:  Negative  Handed:  Right  AIMS (if indicated):     Assets:  Communication Skills Desire for Improvement Resilience  ADL's:  Intact  Cognition:  WNL  Sleep:      Assessment - patient reports partial improvement of her mood, and affect is more reactive. At this time not irritable or agitated. Less intensely ruminative about her family stressors at this time. Denies any active SI or self injurious ideations at this time. Thus far tolerating medications well, but is wanting to taper down Buspar dose.    Treatment Plan Summary: Daily contact with patient to assess and evaluate symptoms and progress in treatment, Medication management, Plan inpatient  treatment  and medications as below Continue to encourage  group and milieu participation to work on coping skills and symptom reduction Continue Lexapro 15 mgrs QDAY for depression and anxiety Continue Neurontin 800 mgrs TID for anxiety , pain Decrease Buspar to 15 mgrs QAM and 30 mgrs QHS  Continue management with Xarelto for history of DVT Continue Glucophage for management of DM Treatment team working on disposition planning options  Jenne Campus, MD 06/16/2016, 2:18 PM

## 2016-06-17 LAB — GLUCOSE, CAPILLARY
Glucose-Capillary: 119 mg/dL — ABNORMAL HIGH (ref 65–99)
Glucose-Capillary: 128 mg/dL — ABNORMAL HIGH (ref 65–99)
Glucose-Capillary: 83 mg/dL (ref 65–99)
Glucose-Capillary: 129 mg/dL — ABNORMAL HIGH (ref 65–99)

## 2016-06-17 NOTE — Progress Notes (Signed)
[facility] MD Progress Note  06/17/2016 12:40 PM Deborah Soto  MRN:  [id number] Subjective: reports that today she is feeling more depressed and discouraged. Attributes this to visit from husband yesterday, where she felt , again, that he took his mother's side instead of hers. Continues to ruminate about her poor relationship with her mother in law, whom she describes as hyper-critical, often dis-empowering her regarding her wife and mother role. At this time denies any active suicidal ideations, and contracts for safety on the unit. Denies medication side effects.  Objective : I have discussed case with treatment team and have met with patient. Patient presents depressed and ruminative about above stressors today. She does respond to support, empathy, empathic listening, and her affect does tend to improve during session. She attributes much of her depression and current admission to family stressors, particularly a poor relationship with her mother in law. She does express interest in a family meeting with her husband , which has been tentatively scheduled for tomorrow. No disruptive or agitated behaviors on unit. Thus far tolerating medications well- Lexapro dose was recently titrated up .   Principal Problem: Intentional drug overdose ([place]) Diagnosis:   Patient Active Problem List   Diagnosis Date Noted  . MDD (major depressive disorder), recurrent severe, without psychosis ([place]) [F33.2] 06/14/2016  . Intentional drug overdose ([place]) [T50.902A]   . Acute encephalopathy [G93.40] 06/11/2016  . Diabetes mellitus type 2 in obese ([place]) [E11.69, E66.9] 06/11/2016  . Suicidal ideation [R45.851] 06/11/2016  . Positive ANA (antinuclear antibody) [R76.8] 04/02/2016  . Hyperparathyroidism ([place]) [E21.3] 01/09/2016  . Snoring [R06.83] 09/20/2015  . 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] 05/12/2015  . Hepatic steatosis [K76.0] 04/18/2015  . Left sided chest pain [R07.9]    . Dyspnea [R06.00]   . Achilles tendonitis [M76.60] 04/08/2012  . 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] 04/08/2012  . PTE (pulmonary thromboembolism) ([place]) [I26.99] 10/10/2011  . Preeclampsia [O14.90] 10/10/2011  . Diverticulitis [K57.92] 10/10/2011  . Bipolar affective disorder ([place]) [F31.9] 10/10/2011  . BMI 34.0-34.9,adult [Z68.34] 10/10/2011  . AR (allergic rhinitis) [J30.9] 10/10/2011   Total Time spent with patient: 20 minutes  Past Medical History:  Past Medical History:  Diagnosis Date  . Anxiety   . Asthma   . Depression   . Diabetes mellitus type 2 in obese ([place]) 06/11/2016  . Embolism - blood clot January 1997 & January 2017   Also had LLE DVT in 1997  . Hypertension   . Sleep apnea     Past Surgical History:  Procedure Laterality Date  . NO PAST SURGERIES     Family History:  Family History  Problem Relation Age of Onset  . Dementia Father   . Heart disease Father   . Clotting disorder Mother   . Arthritis Mother   . Clotting disorder Sister   . Clotting disorder Maternal Grandmother   . Clotting disorder Maternal Aunt   . Rheumatologic disease Neg Hx   . Hyperparathyroidism Neg Hx    Social History:  History  Alcohol Use  . 0.6 oz/week  . 1 Cans of beer per week    Comment: 1-2 times a week     History  Drug Use No    Social History   Social History  . Marital status: Married    Spouse name: N/A  . Number of children: Y  . Years of education: N/A   Occupational History  . admin assist    Social History Main Topics  .  Smoking status: Former Smoker    Packs/day: 1.00    Years: 20.00    Quit date: 03/12/2004  . Smokeless tobacco: Never Used  . Alcohol use 0.6 oz/week    1 Cans of beer per week     Comment: 1-2 times a week  . Drug use: No  . Sexual activity: Not Asked   Other Topics Concern  . None   Social History Narrative   Originally from [place]. Always lived in [place]. 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 Previously has been to [place] in May 1996. No mold exposure recently. No bird exposure. Does have multiple pets.    Additional Social History:    History of alcohol / drug use?: No history of alcohol / drug abuse    Sleep: Good  Appetite:  Fair  Current Medications: Current Facility-Administered Medications  Medication Dose Route Frequency Provider Last Rate Last Dose  . acetaminophen (TYLENOL) tablet 650 mg  650 mg Oral Q6H PRN Ethelene Hal, NP      . alum & mag hydroxide-simeth (MAALOX/MYLANTA) 200-200-20 MG/5ML suspension 30 mL  30 mL Oral Q4H PRN Ethelene Hal, NP      . busPIRone (BUSPAR) tablet 15 mg  15 mg Oral q morning - 10a Jenne Campus, MD   15 mg at 06/17/16 0816  . busPIRone (BUSPAR) tablet 30 mg  30 mg Oral QHS Jenne Campus, MD   Stopped at 06/16/16 2210  . escitalopram (LEXAPRO) tablet 15 mg  15 mg Oral QHS Jenne Campus, MD   15 mg at 06/16/16 2132  . gabapentin (NEURONTIN) capsule 800 mg  800 mg Oral TID Jenne Campus, MD   800 mg at 06/17/16 1155  . hydrOXYzine (ATARAX/VISTARIL) tablet 25 mg  25 mg Oral Q6H PRN Ethelene Hal, NP   25 mg at 06/15/16 2211  . magnesium hydroxide (MILK OF MAGNESIA) suspension 30 mL  30 mL Oral Daily PRN Ethelene Hal, NP      . menthol-cetylpyridinium (CEPACOL) lozenge 3 mg  1 lozenge Oral PRN Ethelene Hal, NP      . metFORMIN (GLUCOPHAGE) tablet 1,000 mg  1,000 mg Oral Q breakfast Encarnacion Slates, NP   1,000 mg at 06/17/16 0816  . metFORMIN (GLUCOPHAGE) tablet 500 mg  500 mg Oral QPM Encarnacion Slates, NP   500 mg at 06/16/16 1700  . rivaroxaban (XARELTO) tablet 20 mg  20 mg Oral Q supper Ethelene Hal, NP   20 mg at 06/16/16 1700  . traZODone (DESYREL) tablet 50 mg  50 mg Oral QHS PRN Ethelene Hal, NP   50 mg at 06/16/16 2132    Lab Results:  Results for orders placed or performed during the hospital encounter of 06/14/16 (from the past 48 hour(s))  Glucose,  capillary     Status: Abnormal   Collection Time: 06/15/16  4:51 PM  Result Value Ref Range   Glucose-Capillary 127 (H) 65 - 99 mg/dL   Comment 1 Notify RN   Glucose, capillary     Status: Abnormal   Collection Time: 06/15/16  9:30 PM  Result Value Ref Range   Glucose-Capillary 117 (H) 65 - 99 mg/dL  Glucose, capillary     Status: Abnormal   Collection Time: 06/16/16 11:50 AM  Result Value Ref Range   Glucose-Capillary 103 (H) 65 - 99 mg/dL  Glucose, capillary     Status: Abnormal   Collection  Time: 06/16/16  4:54 PM  Result Value Ref Range   Glucose-Capillary 100 (H) 65 - 99 mg/dL  Glucose, capillary     Status: Abnormal   Collection Time: 06/16/16  9:15 PM  Result Value Ref Range   Glucose-Capillary 114 (H) 65 - 99 mg/dL  Glucose, capillary     Status: Abnormal   Collection Time: 06/17/16  6:11 AM  Result Value Ref Range   Glucose-Capillary 119 (H) 65 - 99 mg/dL  Glucose, capillary     Status: Abnormal   Collection Time: 06/17/16 11:42 AM  Result Value Ref Range   Glucose-Capillary 128 (H) 65 - 99 mg/dL    Blood Alcohol level:  Lab Results  Component Value Date   ETH <5 16/12/9602    Metabolic Disorder Labs: Lab Results  Component Value Date   HGBA1C 7.6 (H) 05/30/2016   No results found for: PROLACTIN Lab Results  Component Value Date   CHOL 203 (H) 10/10/2011   TRIG 123 10/10/2011   HDL 48 10/10/2011   CHOLHDL 4.2 10/10/2011   VLDL 25 10/10/2011   LDLCALC 130 (H) 10/10/2011    Physical Findings: AIMS: Facial and Oral Movements Muscles of Facial Expression: None, normal Lips and Perioral Area: None, normal Jaw: None, normal Tongue: None, normal,Extremity Movements Upper (arms, wrists, hands, fingers): None, normal Lower (legs, knees, ankles, toes): None, normal, Trunk Movements Neck, shoulders, hips: None, normal, Overall Severity Severity of abnormal movements (highest score from questions above): None, normal Incapacitation due to abnormal  movements: None, normal Patient's awareness of abnormal movements (rate only patient's report): No Awareness, Dental Status Current problems with teeth and/or dentures?: No Does patient usually wear dentures?: No  CIWA:    COWS:     Musculoskeletal: Strength & Muscle Tone: within normal limits Gait & Station: normal Patient leans: N/A  Psychiatric Specialty Exam: Physical Exam  ROS no chest pain, no shortness of breath, no vomiting   Blood pressure (!) 138/101, pulse (!) 109, temperature 98.6 F (37 C), temperature source Oral, resp. rate 18, height '5\' 8"'$  (1.727 m), weight 113.4 kg (250 lb), SpO2 100 %.Body mass index is 38.01 kg/m.  General Appearance: Fairly Groomed  Eye Contact:  Good  Speech:  Normal Rate  Volume:  Normal  Mood:  depressed  Affect:  constricted, tearful at times, reactive   Thought Process:  Goal Directed and Descriptions of Associations: Intact  Orientation:  Other:  fully alert and attentive   Thought Content:  ruminative about family stressors, denies any hallucinations, no delusions expressed   Suicidal Thoughts:  Yes.  without intent/plan describes some passive thoughts of death when ruminating about family issues, but denies any plan or intention of suicide or of hurting self, and contracts for safety on the unit, denies homicidal or violent ideations   Homicidal Thoughts:  No  Memory:  recent and remote grossly intact   Judgement:  Fair  Insight:  Fair  Psychomotor Activity:  Normal  Concentration:  Concentration: Good and Attention Span: Good  Recall:  Good  Fund of Knowledge:  Good  Language:  Good  Akathisia:  Negative  Handed:  Right  AIMS (if indicated):     Assets:  Communication Skills Desire for Improvement Resilience  ADL's:  Intact  Cognition:  WNL  Sleep:  Number of Hours: 6.5   Assessment - patient reports she is more depressed today, which she attributes to a visit from husband last evening where she felt that he continues to  favor  his mother rather than her with regards to long term tension between them. Endorses some passive SI, denies plan or intention of hurting self and contracts for safety on the unit. Denies medication side effects. Affect improves partially with support .   Treatment Plan Summary: Treatment plan reviewed as below today 4/8  Daily contact with patient to assess and evaluate symptoms and progress in treatment, Medication management, Plan inpatient treatment  and medications as below Continue to encourage group and milieu participation to work on coping skills and symptom reduction Continue Lexapro 15 mgrs QDAY for depression and anxiety Continue Neurontin 800 mgrs TID for anxiety , pain Continue Buspar 15 mgrs QAM and 30 mgrs QHS  Continue management with Xarelto for history of DVT Continue Glucophage for management of DM Treatment team working on disposition planning options Family meeting with patient, husband scheduled tentatively for tomorrow PM  Jenne Campus, MD 06/17/2016, 12:40 PM   Patient ID: Deborah Soto, female   DOB: [birth year], 51 y.o.   MRN: [id number]

## 2016-06-17 NOTE — [facility] Group Notes (Signed)
Healthy Support SYstems  Date:  06/17/2016  Time:  1300  Type of Therapy:  Nurse Education  /  Healthy Support Systems :  The group focuses on teaching patients how to identify an develop healthy spport systmes.Marland Kitchen  then how to incorporate them into their lifes, as a way to stay on their road to their recovery.  Participation Level:  Active  Participation Quality:  Appropriate  Affect:  Anxious  Cognitive:  Alert  Insight:  Good  Engagement in Group:  Engaged  Modes of Intervention:  Education  Summary of Progress/Problems:  Deborah Soto 06/17/2016, 2:42 PM

## 2016-06-17 NOTE — Progress Notes (Signed)
Deborah Soto had been up and visible in milieu this evening, seen interacting appropriately with peers. Deborah Soto spoke about not feeling good this evening and was seen tearful at times but did not want to talk about what was bothering her. Deborah Soto did receive bedtime medications without incident this evening. A. Support and encouragement provided. R. Safety maintained, will continue to monitor.

## 2016-06-17 NOTE — Progress Notes (Signed)
D: Pt presents with flat affect and depressed mood. Pt appears withdrawn and guarded. Pt forwarded little information during shift assessment. Pt rates depression 10/10. Anxiety 10/10. When asked if pt is endorsing SI, pt stated, "maybe". Pt denies intent. Pt verbally contracts for safety. When asked about stressors related to depression, pt stated, "same stuff".  A: Orders reviewed with pt. Medications administered as ordered per MD. Verbal support provided. Pt encouraged to attend groups. 15 minute checks performed for safety.  R: Pt compliant with tx.

## 2016-06-17 NOTE — [facility] Group Notes (Signed)
[facility] Group Notes: (Clinical Social Work)   06/17/2016      Type of Therapy:  Group Therapy   Participation Level:  Did Not Attend despite [facility] prompting   Cameryn Chrisley Grossman-Orr, LCSW 06/17/2016, 12:18 PM     

## 2016-06-18 LAB — GLUCOSE, CAPILLARY
Glucose-Capillary: 123 mg/dL — ABNORMAL HIGH (ref 65–99)
Glucose-Capillary: 124 mg/dL — ABNORMAL HIGH (ref 65–99)

## 2016-06-18 NOTE — [facility] Group Notes (Signed)
[facility] LCSW Group Therapy  06/18/2016 1:15pm  Type of Therapy: Group Therapy   Topic: Overcoming Obstacles  Participation Level: Pt invited. Did not attend.  Razan Siler B Kary Sugrue, MSW, LCSWA [phone]    

## 2016-06-18 NOTE — Progress Notes (Signed)
D: Patient pleasant and cooperative with care this shift and is noted to interact well with peers in the milieu. Patient does, however, complain of anxiety due to her upcoming family session in the morning with her husband. The patient states she feels constantly undermined by her husband and feels he does not support her when it comes to dealing with her mother-in-law. A: Encourage staff/peer interaction, medication compliance, and group participation. Administer medications as ordered, maintain Q 15 minute safety checks.R: Pt compliant with medications and attended group session. Pt denies SI at this time and verbally contracts for safety. No signs/symptoms of distress noted.
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
## 2016-06-18 NOTE — Progress Notes (Signed)
Recreation Therapy Notes  Date: 06/18/16 Time: 0930 Location: [address] Dayroom  Group Topic: Stress Management  Goal Area(s) Addresses:  Patient will verbalize importance of using healthy stress management.  Patient will identify positive emotions associated with healthy stress management.   Intervention: Stress Management  Activity :  Guided Imagery.  LRT introduced the stress management technique of guided imagery.  LRT read a script to allow patients to participate and engage in the technique.  Patients were to follow along as the script was read to engage in the activity.  Education:  Stress Management, Discharge Planning.   Education Outcome: Acknowledges edcuation/In group clarification offered/Needs additional education  Clinical Observations/Feedback: Pt did not attend group.   Gratia Disla, LRT/CTRS         Liviya Santini A 06/18/2016 11:04 AM 

## 2016-06-18 NOTE — Progress Notes (Signed)
[facility] MD Progress Note  06/18/2016 4:41 PM Deborah Soto  MRN:  [id number] Subjective: patient reports ongoing anxiety, depression, but endorses improving mood. Denies suicidal ideations. No medication side effects endorsed . Objective : I have discussed case with treatment team and have met with patient. Today we had family therapy session with patient, husband, CSW, Clinical research associate. Patient expressed , discussed issues that she feels have contributed to marital discord, mainly related to feeling she is often dis empowered and unnecessarily criticized by her mother in Social worker . Patient and husband were able to discuss concerns about their marriage becoming more emotionally distant, and their feeling that they have been growing apart. They discussed strategies to improve their marriage and communication styles, and both expressed interest and commitment in going to couple's counseling after discharge. Patient remained appropriate and calm during session, although able to convey her concerns and feelings to her husband. We also discussed potential disposition recommendations, such as couples' therapy , in addition to individual therapy and psychiatric medication management. Patient has been visible on unit, going to groups, noted to be interacting with peers .  Denies medication side effects.   Principal Problem: Intentional drug overdose (HCC) Diagnosis:   Patient Active Problem List   Diagnosis Date Noted  . MDD (major depressive disorder), recurrent severe, without psychosis (HCC) [F33.2] 06/14/2016  . Intentional drug overdose (HCC) [T50.902A]   . Acute encephalopathy [G93.40] 06/11/2016  . Diabetes mellitus type 2 in obese (HCC) [E11.69, E66.9] 06/11/2016  . Suicidal ideation [R45.851] 06/11/2016  . Positive ANA (antinuclear antibody) [R76.8] 04/02/2016  . Hyperparathyroidism (HCC) [E21.3] 01/09/2016  . Snoring [R06.83] 09/20/2015  . Cervical disc disorder with radiculopathy of cervical region  [M50.10] 05/12/2015  . Hepatic steatosis [K76.0] 04/18/2015  . Left sided chest pain [R07.9]   . Dyspnea [R06.00]   . Achilles tendonitis [M76.60] 04/08/2012  . 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] 04/08/2012  . PTE (pulmonary thromboembolism) (HCC) [I26.99] 10/10/2011  . Preeclampsia [O14.90] 10/10/2011  . Diverticulitis [K57.92] 10/10/2011  . Bipolar affective disorder (HCC) [F31.9] 10/10/2011  . BMI 34.0-34.9,adult [Z68.34] 10/10/2011  . AR (allergic rhinitis) [J30.9] 10/10/2011   Total Time spent with patient: 40 minutes - more than 50 % of time spent on counseling and disposition planning   Past Medical History:  Past Medical History:  Diagnosis Date  . Anxiety   . Asthma   . Depression   . Diabetes mellitus type 2 in obese (HCC) 06/11/2016  . Embolism - blood clot January 1997 & January 2017   Also had LLE DVT in 1997  . Hypertension   . Sleep apnea     Past Surgical History:  Procedure Laterality Date  . NO PAST SURGERIES     Family History:  Family History  Problem Relation Age of Onset  . Dementia Father   . Heart disease Father   . Clotting disorder Mother   . Arthritis Mother   . Clotting disorder Sister   . Clotting disorder Maternal Grandmother   . Clotting disorder Maternal Aunt   . Rheumatologic disease Neg Hx   . Hyperparathyroidism Neg Hx    Social History:  History  Alcohol Use  . 0.6 oz/week  . 1 Cans of beer per week    Comment: 1-2 times a week     History  Drug Use No    Social History   Social History  . Marital status: Married    Spouse name: N/A  . Number of children: Y  . Years of education:  N/A   Occupational History  . admin assist    Social History Main Topics  . Smoking status: Former Smoker    Packs/day: 1.00    Years: 20.00    Quit date: 03/12/2004  . Smokeless tobacco: Never Used  . Alcohol use 0.6 oz/week    1 Cans of beer per week     Comment: 1-2 times a week  . Drug use: No  . Sexual activity: Not Asked   Other  Topics Concern  . None   Social History Narrative   Originally from [place]. Always lived in [place]. 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 No international travel recently. Previously has been to [place] in May 1996. No mold exposure recently. No bird exposure. Does have multiple pets.    Additional Social History:    History of alcohol / drug use?: No history of alcohol / drug abuse    Sleep: improving   Appetite:  improving   Current Medications: Current Facility-Administered Medications  Medication Dose Route Frequency Provider Last Rate Last Dose  . acetaminophen (TYLENOL) tablet 650 mg  650 mg Oral Q6H PRN Ethelene Hal, NP      . alum & mag hydroxide-simeth (MAALOX/MYLANTA) 200-200-20 MG/5ML suspension 30 mL  30 mL Oral Q4H PRN Ethelene Hal, NP      . busPIRone (BUSPAR) tablet 15 mg  15 mg Oral q morning - 10a Jenne Campus, MD   15 mg at 06/18/16 0804  . busPIRone (BUSPAR) tablet 30 mg  30 mg Oral QHS Jenne Campus, MD   30 mg at 06/17/16 2102  . escitalopram (LEXAPRO) tablet 15 mg  15 mg Oral QHS Jenne Campus, MD   15 mg at 06/17/16 2101  . gabapentin (NEURONTIN) capsule 800 mg  800 mg Oral TID Jenne Campus, MD   800 mg at 06/18/16 1152  . hydrOXYzine (ATARAX/VISTARIL) tablet 25 mg  25 mg Oral Q6H PRN Ethelene Hal, NP   25 mg at 06/18/16 0803  . magnesium hydroxide (MILK OF MAGNESIA) suspension 30 mL  30 mL Oral Daily PRN Ethelene Hal, NP      . menthol-cetylpyridinium (CEPACOL) lozenge 3 mg  1 lozenge Oral PRN Ethelene Hal, NP      . metFORMIN (GLUCOPHAGE) tablet 1,000 mg  1,000 mg Oral Q breakfast Encarnacion Slates, NP   1,000 mg at 06/18/16 0804  . metFORMIN (GLUCOPHAGE) tablet 500 mg  500 mg Oral QPM Encarnacion Slates, NP   500 mg at 06/17/16 1701  . rivaroxaban (XARELTO) tablet 20 mg  20 mg Oral Q supper Ethelene Hal, NP   20 mg at 06/17/16 1701  . traZODone (DESYREL) tablet 50 mg  50 mg Oral QHS PRN Ethelene Hal, NP   50 mg at 06/17/16 2102    Lab Results:  Results for orders placed or performed during the hospital encounter of 06/14/16 (from the past 48 hour(s))  Glucose, capillary     Status: Abnormal   Collection Time: 06/16/16  4:54 PM  Result Value Ref Range   Glucose-Capillary 100 (H) 65 - 99 mg/dL  Glucose, capillary     Status: Abnormal   Collection Time: 06/16/16  9:15 PM  Result Value Ref Range   Glucose-Capillary 114 (H) 65 - 99 mg/dL  Glucose, capillary     Status: Abnormal   Collection Time: 06/17/16  6:11 AM  Result Value Ref Range   Glucose-Capillary 119 (H) 65 -  99 mg/dL  Glucose, capillary     Status: Abnormal   Collection Time: 06/17/16 11:42 AM  Result Value Ref Range   Glucose-Capillary 128 (H) 65 - 99 mg/dL  Glucose, capillary     Status: None   Collection Time: 06/17/16  4:58 PM  Result Value Ref Range   Glucose-Capillary 83 65 - 99 mg/dL  Glucose, capillary     Status: Abnormal   Collection Time: 06/17/16  9:25 PM  Result Value Ref Range   Glucose-Capillary 129 (H) 65 - 99 mg/dL  Glucose, capillary     Status: Abnormal   Collection Time: 06/18/16  6:33 AM  Result Value Ref Range   Glucose-Capillary 123 (H) 65 - 99 mg/dL   Comment 1 Notify RN     Blood Alcohol level:  Lab Results  Component Value Date   ETH <5 99/24/2683    Metabolic Disorder Labs: Lab Results  Component Value Date   HGBA1C 7.6 (H) 05/30/2016   No results found for: PROLACTIN Lab Results  Component Value Date   CHOL 203 (H) 10/10/2011   TRIG 123 10/10/2011   HDL 48 10/10/2011   CHOLHDL 4.2 10/10/2011   VLDL 25 10/10/2011   LDLCALC 130 (H) 10/10/2011    Physical Findings: AIMS: Facial and Oral Movements Muscles of Facial Expression: None, normal Lips and Perioral Area: None, normal Jaw: None, normal Tongue: None, normal,Extremity Movements Upper (arms, wrists, hands, fingers): None, normal Lower (legs, knees, ankles, toes): None, normal, Trunk Movements Neck,  shoulders, hips: None, normal, Overall Severity Severity of abnormal movements (highest score from questions above): None, normal Incapacitation due to abnormal movements: None, normal Patient's awareness of abnormal movements (rate only patient's report): No Awareness, Dental Status Current problems with teeth and/or dentures?: No Does patient usually wear dentures?: No  CIWA:    COWS:     Musculoskeletal: Strength & Muscle Tone: within normal limits Gait & Station: normal Patient leans: N/A  Psychiatric Specialty Exam: Physical Exam  ROS no chest pain, no shortness of breath   Blood pressure 116/79, pulse (!) 107, temperature 97.8 F (36.6 C), temperature source Oral, resp. rate 18, height '5\' 8"'$  (1.727 m), weight 113.4 kg (250 lb), SpO2 100 %.Body mass index is 38.01 kg/m.  General Appearance: improved grooming   Eye Contact:  Good  Speech:  Normal Rate  Volume:  Normal  Mood:  remains depressed, but has improved compared to admission  Affect:  less constricted, more reactive affect, anxious   Thought Process:  Linear and Descriptions of Associations: Intact  Orientation:  Full (Time, Place, and Person)  Thought Content:  denies hallucinations, no delusions expressed   Suicidal Thoughts:  No no active suicidal or self injurious ideations, no homicidal or violent ideations  Homicidal Thoughts:  No  Memory:  recent and remote grossly intact   Judgement:  Fair  Insight:  Fair  Psychomotor Activity:  Normal  Concentration:  Concentration: Good and Attention Span: Good  Recall:  Good  Fund of Knowledge:  Good  Language:  Good  Akathisia:  NA  Handed:  Right  AIMS (if indicated):     Assets:  Desire for Improvement Resilience  ADL's:  Intact  Cognition:  WNL  Sleep:  Number of Hours: 6.5   Assessment - patient anxious today about family meeting with her husband and treatment team. Was able to convey during family session family dynamics and issues that she feels have  contributed to marital stress, distancing and to her recent  decompensation. Her and husband both acknowledged that their relationship has been becoming more emotionally distant , and they both expressed interest and commitment to couples' therapy .  Thus far tolerating medications well. Mood and affect improving gradually since admission.   Treatment Plan Summary: Treatment plan reviewed as below today 4/9  Daily contact with patient to assess and evaluate symptoms and progress in treatment, Medication management, Plan inpatient treatment  and medications as below Continue to encourage group and milieu participation to work on coping skills and symptom reduction Continue Lexapro 15 mgrs QDAY for depression and anxiety Continue Neurontin 800 mgrs TID for anxiety , pain Continue Buspar 15 mgrs QAM and 30 mgrs QHS  Continue management with Xarelto for history of DVT Continue Glucophage for management of DM Treatment team working on disposition planning options  Jenne Campus, MD 06/18/2016, 4:41 PM   Patient ID: Deborah Soto, female   DOB: 03-Jun-1965, 50 y.o.   MRN: [id number]

## 2016-06-18 NOTE — Progress Notes (Signed)
D: Pt presents anxious on approach. Pt reported increased anxiety level due to family session with her husband this afternoon. Pt rates anxiety 8/10. Pt requested vistaril for anxiety. Pt rates depression 5/10. Pt reports passive suicidal thoughts with no active intent or plan. Pt verbally contracts for safety. Pt goal today is to stay positive and not have a melt down after family session with the MD. A: Medications reviewed with pt. Medications administered as ordered per MD. Verbal support provided. Pt encouraged to attend groups. Pt encouraged to deep breathe during increased anxiety levels. 15 minute checks performed for safety.  R: Pt receptive to tx.

## 2016-06-18 NOTE — Progress Notes (Signed)
Adult Psychoeducational Group Note  Date:  06/18/2016 Time:  10:13 PM  Group Topic/Focus:  Wrap-Up Group:   The focus of this group is to help patients review their daily goal of treatment and discuss progress on daily workbooks.  Participation Level:  Active  Participation Quality:  Appropriate  Affect:  Appropriate  Cognitive:  Alert  Insight: Appropriate  Engagement in Group:  Engaged  Modes of Intervention:  Discussion  Additional Comments:  Patient expressed that she had a good and positive day.   Meggin Ola L Gustava Berland 06/18/2016, 10:13 PM

## 2016-06-19 LAB — GLUCOSE, CAPILLARY
Glucose-Capillary: 129 mg/dL — ABNORMAL HIGH (ref 65–99)
Glucose-Capillary: 107 mg/dL — ABNORMAL HIGH (ref 65–99)

## 2016-06-19 NOTE — Progress Notes (Signed)
Deborah Soto had been up and visible in milieu this evening, spoke of her day and spoke it went well but did speak about some on-going anxiety this evening and spoke about how she is hoping that some of the medications will help with this issue. She spoke of her family session today and spoke about that as being a stressor for her today. She was seen interacting appropriately with peers in milieu and received all bedtime medications without incident. A. Support and encouragement provided. R. Safety maintained, will continue to monitor.

## 2016-06-19 NOTE — Progress Notes (Signed)
Recreation Therapy Notes  Animal-Assisted Activity (AAA) Program Checklist/Progress Notes Patient Eligibility Criteria Checklist & Daily Group note for Rec TxIntervention  Date: 04.10.2018 Time: 2:45pm Location: [address]   AAA/T Program Assumption of Risk Form signed by Patient/ or Parent Legal Guardian Yes  Patient is free of allergies or sever asthma Yes  Patient reports no fear of animals Yes  Patient reports no history of cruelty to animals Yes  Patient understands his/her participation is voluntary Yes  Patient washes hands before animal contact Yes  Patient washes hands after animal contact Yes  Behavioral Response: Appropriate   Education:Hand Washing, Appropriate Animal Interaction   Education Outcome: Acknowledges education.   Clinical Observations/Feedback: Patient attended session and interacted appropriately with therapy dog and peers. Patient asked appropriate questions about therapy dog and his training. Patient shared stories about their pets at home with group.   Marykay Lex Yaseen Gilberg, LRT/CTRS        Shanoah Asbill L 06/19/2016 3:09 PM

## 2016-06-19 NOTE — Progress Notes (Signed)
D:  Patient's self inventory sheet, patient sleeps good, sleep medication helpful.  Good appetite, normal energy level, too early to concentration.  Rated depression 5, hopeless 4, anxiety 8.  Denied withdrawals.  Denied SI.  Denied physical problems.  Physical pain.  Worst pain #6 in past 24 hours, all over chronic pain has returned, neurotin helpful.  Goal is letting things from the past go.  Plans to focus on what will happen today/future that will be better. A:  Medications administered per MD orders.  Emotional support and encouragement given patient. R:  Denied SI today, contracts for safety.  Denied HI.  Denied A/V hallucinations.  Stated she deals with a lot of anxiety, which she has been told is the cause of her chest pain at times.

## 2016-06-19 NOTE — [facility] Group Notes (Signed)
[facility] LCSW Group Therapy 06/19/2016 1:15 PM  Type of Therapy: Group Therapy- Feelings about Diagnosis  Participation Level: Active   Participation Quality:  Appropriate  Affect:  Appropriate  Cognitive: Alert and Oriented   Insight:  Developing   Engagement in Therapy: Developing/Improving and Engaged   Modes of Intervention: Clarification, Confrontation, Discussion, Education, Exploration, Limit-setting, Orientation, Problem-solving, Rapport Building, Dance movement psychotherapist, Socialization and Support  Description of Group:   This group will allow patients to explore their thoughts and feelings about diagnoses they have received. Patients will be guided to explore their level of understanding and acceptance of these diagnoses. Facilitator will encourage patients to process their thoughts and feelings about the reactions of others to their diagnosis, and will guide patients in identifying ways to discuss their diagnosis with significant others in their lives. This group will be process-oriented, with patients participating in exploration of their own experiences as well as giving and receiving support and challenge from other group members.  Summary of Progress/Problems:   Pt states that she is hesitant to share her diagnosis with others because she does not want to be judged or looked at differently. Pt will be returning to work soon after discharge and she is contemplating is she should share information with her coworkers about her hospital visit. Pt is also struggling with what she should tell her children about her hospital admission. Pt has a lot of anxiety surrounding discharge but does feel like she is ready to go.  Therapeutic Modalities:   Cognitive Behavioral Therapy Solution Focused Therapy Motivational Interviewing Relapse Prevention Therapy  Jonathon Jordan, MSW, LCSWA [phone] 06/19/2016 3:37 PM

## 2016-06-19 NOTE — [facility] Group Notes (Signed)
The focus of this group is to educate the patient on the purpose and policies of crisis stabilization and provide a format to answer questions about their admission.  The group details unit policies and expectations of patients while admitted.  Patient attended 0900 nurse education orientation group this morning.  Patient actively participated and had appropriate affect.  Patient is alert.  Patient has appropriate insight and appropriate engagement.  Today patient will work on 3 goals for discharge.    
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
## 2016-06-19 NOTE — Plan of Care (Signed)
Problem: Education: Goal: Utilization of techniques to improve thought processes will improve Outcome: Progressing Nurse discussed depression/anxiety/coping skills with patient.    

## 2016-06-19 NOTE — Progress Notes (Signed)
[facility] MD Progress Note  06/19/2016 3:53 PM Sam Wunschel  MRN:  [id number]  Subjective: Hansini reports, It has been a good day today. The group sessions has been helping & will be even more helpful for me after discharge. I'm now able to open up to my peers, this I have not been able to do in the past. I received a lot of support & feed backs from my peers. I was also able to be emotionally supportive to another young patient who is going through difficult times as well. I'm looking forward to getting discharged with the intention of improving my future career. I'm thinking about going back to school to become a mental health care worker".  Objective : I have discussed case with treatment team and have met with patient. Yesterday,  we had family therapy session with patient, husband, CSW, Probation officer. Patient expressed , discussed issues that she feels have contributed to marital discord, mainly related to feeling she is often dis empowered and unnecessarily criticized by her mother in Sports coach . Patient and husband were able to discuss concerns about their marriage becoming more emotionally distant, and their feeling that they have been growing apart. They discussed strategies to improve their marriage and communication styles, and both expressed interest and commitment in going to couple's counseling after discharge. Patient remained appropriate and calm during session, although able to convey her concerns and feelings to her husband. We also discussed potential disposition recommendations, such as couples' therapy , in addition to individual therapy and psychiatric medication management. Patient has been visible on unit, going to groups, noted to be interacting with peers .  Denies medication side effects.  Principal Problem: Intentional drug overdose ([place])  Diagnosis:   Patient Active Problem List   Diagnosis Date Noted  . MDD (major depressive disorder), recurrent severe, without psychosis ([place])  [F33.2] 06/14/2016    Priority: High  . Intentional drug overdose ([place]) [T50.902A]   . Acute encephalopathy [G93.40] 06/11/2016  . Diabetes mellitus type 2 in obese ([place]) [E11.69, E66.9] 06/11/2016  . Suicidal ideation [R45.851] 06/11/2016  . Positive ANA (antinuclear antibody) [R76.8] 04/02/2016  . Hyperparathyroidism ([place]) [E21.3] 01/09/2016  . Snoring [R06.83] 09/20/2015  . 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] 05/12/2015  . Hepatic steatosis [K76.0] 04/18/2015  . Left sided chest pain [R07.9]   . Dyspnea [R06.00]   . Achilles tendonitis [M76.60] 04/08/2012  . 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] 04/08/2012  . PTE (pulmonary thromboembolism) ([place]) [I26.99] 10/10/2011  . Preeclampsia [O14.90] 10/10/2011  . Diverticulitis [K57.92] 10/10/2011  . Bipolar affective disorder ([place]) [F31.9] 10/10/2011  . BMI 34.0-34.9,adult [Z68.34] 10/10/2011  . AR (allergic rhinitis) [J30.9] 10/10/2011   Total Time spent with patient: 15 minutes-  Past Medical History:  Past Medical History:  Diagnosis Date  . Anxiety   . Asthma   . Depression   . Diabetes mellitus type 2 in obese ([place]) 06/11/2016  . Embolism - blood clot January 1997 & January 2017   Also had LLE DVT in 1997  . Hypertension   . Sleep apnea     Past Surgical History:  Procedure Laterality Date  . NO PAST SURGERIES     Family History:  Family History  Problem Relation Age of Onset  . Dementia Father   . Heart disease Father   . Clotting disorder Mother   . Arthritis Mother   . Clotting disorder Sister   . Clotting disorder Maternal Grandmother   . Clotting disorder Maternal Aunt   .  Rheumatologic disease Neg Hx   . Hyperparathyroidism Neg Hx    Social History:  History  Alcohol Use  . 0.6 oz/week  . 1 Cans of beer per week    Comment: 1-2 times a week     History  Drug Use No    Social History   Social History  . Marital status: Married    Spouse name: N/A  . Number of children: Y   . Years of education: N/A   Occupational History  . admin assist    Social History Main Topics  . Smoking status: Former Smoker    Packs/day: 1.00    Years: 20.00    Quit date: 03/12/2004  . Smokeless tobacco: Never Used  . Alcohol use 0.6 oz/week    1 Cans of beer per week     Comment: 1-2 times a week  . Drug use: No  . Sexual activity: Not Asked   Other Topics Concern  . None   Social History Narrative   Originally from [place]. Always lived in [place]. 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 No international travel recently. Previously has been to [place] in May 1996. No mold exposure recently. No bird exposure. Does have multiple pets.    Additional Social History:    History of alcohol / drug use?: No history of alcohol / drug abuse    Sleep: improving   Appetite:  improving   Current Medications: Current Facility-Administered Medications  Medication Dose Route Frequency Provider Last Rate Last Dose  . acetaminophen (TYLENOL) tablet 650 mg  650 mg Oral Q6H PRN Ethelene Hal, NP      . alum & mag hydroxide-simeth (MAALOX/MYLANTA) 200-200-20 MG/5ML suspension 30 mL  30 mL Oral Q4H PRN Ethelene Hal, NP      . busPIRone (BUSPAR) tablet 15 mg  15 mg Oral q morning - 10a Myer Peer Cobos, MD   15 mg at 06/19/16 0900  . busPIRone (BUSPAR) tablet 30 mg  30 mg Oral QHS Myer Peer Cobos, MD   30 mg at 06/18/16 2200  . escitalopram (LEXAPRO) tablet 15 mg  15 mg Oral QHS Myer Peer Cobos, MD   15 mg at 06/18/16 2200  . gabapentin (NEURONTIN) capsule 800 mg  800 mg Oral TID Jenne Campus, MD   800 mg at 06/19/16 1149  . hydrOXYzine (ATARAX/VISTARIL) tablet 25 mg  25 mg Oral Q6H PRN Ethelene Hal, NP   25 mg at 06/18/16 2203  . magnesium hydroxide (MILK OF MAGNESIA) suspension 30 mL  30 mL Oral Daily PRN Ethelene Hal, NP      . menthol-cetylpyridinium (CEPACOL) lozenge 3 mg  1 lozenge Oral PRN Ethelene Hal, NP      . metFORMIN (GLUCOPHAGE)  tablet 1,000 mg  1,000 mg Oral Q breakfast Encarnacion Slates, NP   1,000 mg at 06/19/16 2297  . metFORMIN (GLUCOPHAGE) tablet 500 mg  500 mg Oral QPM Encarnacion Slates, NP   500 mg at 06/18/16 1650  . rivaroxaban (XARELTO) tablet 20 mg  20 mg Oral Q supper Ethelene Hal, NP   20 mg at 06/18/16 1650  . traZODone (DESYREL) tablet 50 mg  50 mg Oral QHS PRN Ethelene Hal, NP   50 mg at 06/18/16 2203    Lab Results:  Results for orders placed or performed during the hospital encounter of 06/14/16 (from the past 48 hour(s))  Glucose, capillary     Status: None  Collection Time: 06/17/16  4:58 PM  Result Value Ref Range   Glucose-Capillary 83 65 - 99 mg/dL  Glucose, capillary     Status: Abnormal   Collection Time: 06/17/16  9:25 PM  Result Value Ref Range   Glucose-Capillary 129 (H) 65 - 99 mg/dL  Glucose, capillary     Status: Abnormal   Collection Time: 06/18/16  6:33 AM  Result Value Ref Range   Glucose-Capillary 123 (H) 65 - 99 mg/dL   Comment 1 Notify RN   Glucose, capillary     Status: Abnormal   Collection Time: 06/18/16  5:04 PM  Result Value Ref Range   Glucose-Capillary 124 (H) 65 - 99 mg/dL  Glucose, capillary     Status: Abnormal   Collection Time: 06/19/16  6:16 AM  Result Value Ref Range   Glucose-Capillary 129 (H) 65 - 99 mg/dL   Comment 1 Notify RN     Blood Alcohol level:  Lab Results  Component Value Date   ETH <5 83/15/1761    Metabolic Disorder Labs: Lab Results  Component Value Date   HGBA1C 7.6 (H) 05/30/2016   No results found for: PROLACTIN Lab Results  Component Value Date   CHOL 203 (H) 10/10/2011   TRIG 123 10/10/2011   HDL 48 10/10/2011   CHOLHDL 4.2 10/10/2011   VLDL 25 10/10/2011   LDLCALC 130 (H) 10/10/2011   Physical Findings: AIMS: Facial and Oral Movements Muscles of Facial Expression: None, normal Lips and Perioral Area: None, normal Jaw: None, normal Tongue: None, normal,Extremity Movements Upper (arms, wrists, hands,  fingers): None, normal Lower (legs, knees, ankles, toes): None, normal, Trunk Movements Neck, shoulders, hips: None, normal, Overall Severity Severity of abnormal movements (highest score from questions above): None, normal Incapacitation due to abnormal movements: None, normal Patient's awareness of abnormal movements (rate only patient's report): No Awareness, Dental Status Current problems with teeth and/or dentures?: No Does patient usually wear dentures?: No  CIWA:  CIWA-Ar Total: 1 COWS:  COWS Total Score: 2  Musculoskeletal: Strength & Muscle Tone: within normal limits Gait & Station: normal Patient leans: N/A  Psychiatric Specialty Exam: Physical Exam  ROS no chest pain, no shortness of breath   Blood pressure 122/81, pulse 91, temperature 98.5 F (36.9 C), temperature source Oral, resp. rate 18, height '5\' 8"'$  (1.727 m), weight 113.4 kg (250 lb), SpO2 100 %.Body mass index is 38.01 kg/m.  General Appearance: improved grooming   Eye Contact:  Good  Speech:  Normal Rate  Volume:  Normal  Mood:  remains depressed, but has improved compared to admission  Affect:  less constricted, more reactive affect, anxious   Thought Process:  Linear and Descriptions of Associations: Intact  Orientation:  Full (Time, Place, and Person)  Thought Content:  denies hallucinations, no delusions expressed   Suicidal Thoughts:  No no active suicidal or self injurious ideations, no homicidal or violent ideations  Homicidal Thoughts:  No  Memory:  recent and remote grossly intact   Judgement:  Fair  Insight:  Fair  Psychomotor Activity:  Normal  Concentration:  Concentration: Good and Attention Span: Good  Recall:  Good  Fund of Knowledge:  Good  Language:  Good  Akathisia:  NA  Handed:  Right  AIMS (if indicated):     Assets:  Desire for Improvement Resilience  ADL's:  Intact  Cognition:  WNL  Sleep:  Number of Hours: 6.25   Assessment - Patient presents today feeling more optimistic.  She says she  was able to convey her frustrations & family dynamics and issues that she feels have contributed to marital stress, distancing and to her recent decompensation during group sessions today. She says that she got a lot of positive feed backs from the other patients. She says she and husband both acknowledged that their relationship has been becoming more emotionally distant and they both expressed interest and commitment to couples' therapy.  Thus far tolerating medications well. Mood and affect improving gradually since admission.   Treatment Plan Summary: Treatment plan reviewed as below today 06/19/16  Daily contact with patient to assess and evaluate symptoms and progress in treatment, Medication management, Plan inpatient treatment  and medications as below Continue to encourage group and milieu participation to work on coping skills and symptom reduction Continue Lexapro 15 mgrs QDAY for depression and anxiety Continue Neurontin 800 mgrs TID for anxiety , pain Continue Buspar 15 mgrs QAM and 30 mgrs QHS  Continue management with Xarelto for history of DVT Continue Glucophage for management of DM Treatment team working on disposition planning options  Encarnacion Slates, NP, PMHNP, FNP-BC 06/19/2016, 3:53 PM  Patient ID: Dejanay Wamboldt, female   DOB: 04/02/1965, 51 y.o.   MRN: [id number]

## 2016-06-20 LAB — GLUCOSE, CAPILLARY
Glucose-Capillary: 128 mg/dL — ABNORMAL HIGH (ref 65–99)
Glucose-Capillary: 146 mg/dL — ABNORMAL HIGH (ref 65–99)

## 2016-06-20 MED ORDER — DULOXETINE HCL 30 MG PO CPEP
30.0000 mg | ORAL_CAPSULE | Freq: Every day | ORAL | Status: DC
  Administered 2016-06-20 – 2016-06-21 (×2): 30 mg via ORAL
  Filled 2016-06-20 (×6): qty 1

## 2016-06-20 MED ORDER — BUSPIRONE HCL 15 MG PO TABS: ORAL_TABLET | ORAL | 0 refills | Status: DC

## 2016-06-20 MED ORDER — RIVAROXABAN 20 MG PO TABS: ORAL_TABLET | ORAL | 0 refills | Status: DC

## 2016-06-20 MED ORDER — TRAZODONE HCL 50 MG PO TABS: 50.0000 mg | ORAL_TABLET | Freq: Every evening | ORAL | 0 refills | Status: DC | PRN

## 2016-06-20 MED ORDER — GABAPENTIN 400 MG PO CAPS: 800.0000 mg | ORAL_CAPSULE | Freq: Three times a day (TID) | ORAL | 0 refills | Status: DC

## 2016-06-20 MED ORDER — BUSPIRONE HCL 15 MG PO TABS
15.0000 mg | ORAL_TABLET | Freq: Two times a day (BID) | ORAL | Status: DC
Start: 2016-06-21 — End: 2016-06-21
  Administered 2016-06-21: 15 mg via ORAL
  Filled 2016-06-20 (×5): qty 1

## 2016-06-20 MED ORDER — METFORMIN HCL 500 MG PO TABS: ORAL_TABLET | ORAL | Status: DC

## 2016-06-20 MED ORDER — HYDROXYZINE HCL 25 MG PO TABS: 25.0000 mg | ORAL_TABLET | Freq: Four times a day (QID) | ORAL | 0 refills | Status: DC | PRN

## 2016-06-20 MED ORDER — ESCITALOPRAM OXALATE 5 MG PO TABS: 15.0000 mg | ORAL_TABLET | Freq: Every day | ORAL | 0 refills | Status: DC

## 2016-06-20 NOTE — Progress Notes (Signed)
Recreation Therapy Notes  Date: 06/20/16 Time: 0930 Location: [address] Dayroom  Group Topic: Stress Management  Goal Area(s) Addresses:  Patient will verbalize importance of using healthy stress management.  Patient will identify positive emotions associated with healthy stress management.   Intervention: Stress Management  Activity :  Body Scan Meditation.  LRT introduced the stress management technique of meditation.  LRT played a meditation from the Calm app that focused on body scan to examine the feelings and tension within the body.  Patients were to follow along with the meditation to played to engage in the technique.  Education:  Stress Management, Discharge Planning.   Education Outcome: Acknowledges edcuation/In group clarification offered/Needs additional education  Clinical Observations/Feedback: Pt did not attend group.   Zyden Suman, LRT/CTRS         D'Arcy Abraha A 06/20/2016 11:27 AM 

## 2016-06-20 NOTE — Progress Notes (Signed)
Pt stated her day started off between a 1 and 2 because she was supposed to discharge but was scared so she didn't. Later it became a 7 and 8 because she was able to process her thoughts about discharging and her anxiety has gone down.

## 2016-06-20 NOTE — Progress Notes (Addendum)
[facility] MD Progress Note  06/20/2016 4:26 PM Jaysa Kise  MRN:  [id number]  Subjective:  Patient states she had been feeling better yesterday , but today is again feeling more anxious, depressed . States this is related to ongoing perception that he husband does not validate her concerns about her mother in law. However, she is able to process this concern , and acknowledges she feels there has been some improvement and that he has attempted to be more supportive, such as by agreeing to go to couple's counseling after she is discharged.  Patient is also describing history of chronic , generalized pain , affecting torso, lower extremities, back. She expresses interest in Cymbalta, as  this would help  address not only her depression, anxiety, but also pain.   Objective : I have discussed case with treatment team and have met with patient. Patient visible on unit, going to some groups, interacting appropriately with peers. Behavior on unit in good control. She denies medication side effects - she states , as above, that she is interested in Cymbalta trial.  As noted states she feels she has been making progress since admission but today again feeling more anxious and discouraged. Affect improves during session with support, empathy.  She is future oriented, and has thought , for example , of going back to college now that her children are getting older. At this time denies any active suicidal ideations, but expresses some sense of anxiety, apprehension about discharging   Principal Problem: Intentional drug overdose [facility])  Diagnosis:   Patient Active Problem List   Diagnosis Date Noted  . MDD (major depressive disorder), recurrent severe, without psychosis ([place]) [F33.2] 06/14/2016  . Intentional drug overdose ([place]) [T50.902A]   . Acute encephalopathy [G93.40] 06/11/2016  . Diabetes mellitus type 2 in obese ([place]) [E11.69, E66.9] 06/11/2016  . Suicidal ideation [R45.851] 06/11/2016  . Positive  ANA (antinuclear antibody) [R76.8] 04/02/2016  . Hyperparathyroidism ([place]) [E21.3] 01/09/2016  . Snoring [R06.83] 09/20/2015  . 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] 05/12/2015  . Hepatic steatosis [K76.0] 04/18/2015  . Left sided chest pain [R07.9]   . Dyspnea [R06.00]   . Achilles tendonitis [M76.60] 04/08/2012  . 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] 04/08/2012  . PTE (pulmonary thromboembolism) ([place]) [I26.99] 10/10/2011  . Preeclampsia [O14.90] 10/10/2011  . Diverticulitis [K57.92] 10/10/2011  . Bipolar affective disorder ([place]) [F31.9] 10/10/2011  . BMI 34.0-34.9,adult [Z68.34] 10/10/2011  . AR (allergic rhinitis) [J30.9] 10/10/2011   Total Time spent with patient: 20 minutes-  Past Medical History:  Past Medical History:  Diagnosis Date  . Anxiety   . Asthma   . Depression   . Diabetes mellitus type 2 in obese ([place]) 06/11/2016  . Embolism - blood clot January 1997 & January 2017   Also had LLE DVT in 1997  . Hypertension   . Sleep apnea     Past Surgical History:  Procedure Laterality Date  . NO PAST SURGERIES     Family History:  Family History  Problem Relation Age of Onset  . Dementia Father   . Heart disease Father   . Clotting disorder Mother   . Arthritis Mother   . Clotting disorder Sister   . Clotting disorder Maternal Grandmother   . Clotting disorder Maternal Aunt   . Rheumatologic disease Neg Hx   . Hyperparathyroidism Neg Hx    Social History:  History  Alcohol Use  . 0.6 oz/week  . 1 Cans of beer per week  Comment: 1-2 times a week     History  Drug Use No    Social History   Social History  . Marital status: Married    Spouse name: N/A  . Number of children: Y  . Years of education: N/A   Occupational History  . admin assist    Social History Main Topics  . Smoking status: Former Smoker    Packs/day: 1.00    Years: 20.00    Quit date: 03/12/2004  . Smokeless tobacco: Never Used  . Alcohol use 0.6 oz/week     1 Cans of beer per week     Comment: 1-2 times a week  . Drug use: No  . Sexual activity: Not Asked   Other Topics Concern  . None   Social History Narrative   Originally from [place]. Always lived in [place]. 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 No international travel recently. Previously has been to [place] in May 1996. No mold exposure recently. No bird exposure. Does have multiple pets.    Additional Social History:    History of alcohol / drug use?: No history of alcohol / drug abuse    Sleep: Good  Appetite:  Good  Current Medications: Current Facility-Administered Medications  Medication Dose Route Frequency Provider Last Rate Last Dose  . acetaminophen (TYLENOL) tablet 650 mg  650 mg Oral Q6H PRN Ethelene Hal, NP   650 mg at 06/19/16 2128  . alum & mag hydroxide-simeth (MAALOX/MYLANTA) 200-200-20 MG/5ML suspension 30 mL  30 mL Oral Q4H PRN Ethelene Hal, NP      . busPIRone (BUSPAR) tablet 15 mg  15 mg Oral q morning - 10a Myer Peer Jodee Wagenaar, MD   15 mg at 06/20/16 1116  . busPIRone (BUSPAR) tablet 30 mg  30 mg Oral QHS Jenne Campus, MD   30 mg at 06/19/16 2126  . escitalopram (LEXAPRO) tablet 15 mg  15 mg Oral QHS Jenne Campus, MD   15 mg at 06/19/16 2126  . gabapentin (NEURONTIN) capsule 800 mg  800 mg Oral TID Jenne Campus, MD   800 mg at 06/20/16 1116  . hydrOXYzine (ATARAX/VISTARIL) tablet 25 mg  25 mg Oral Q6H PRN Ethelene Hal, NP   25 mg at 06/18/16 2203  . magnesium hydroxide (MILK OF MAGNESIA) suspension 30 mL  30 mL Oral Daily PRN Ethelene Hal, NP      . menthol-cetylpyridinium (CEPACOL) lozenge 3 mg  1 lozenge Oral PRN Ethelene Hal, NP      . metFORMIN (GLUCOPHAGE) tablet 1,000 mg  1,000 mg Oral Q breakfast Encarnacion Slates, NP   1,000 mg at 06/20/16 0809  . metFORMIN (GLUCOPHAGE) tablet 500 mg  500 mg Oral QPM Encarnacion Slates, NP   500 mg at 06/19/16 1705  . rivaroxaban (XARELTO) tablet 20 mg  20 mg Oral Q supper  Ethelene Hal, NP   20 mg at 06/19/16 1706  . traZODone (DESYREL) tablet 50 mg  50 mg Oral QHS PRN Ethelene Hal, NP   50 mg at 06/19/16 2128    Lab Results:  Results for orders placed or performed during the hospital encounter of 06/14/16 (from the past 48 hour(s))  Glucose, capillary     Status: Abnormal   Collection Time: 06/18/16  5:04 PM  Result Value Ref Range   Glucose-Capillary 124 (H) 65 - 99 mg/dL  Glucose, capillary     Status: Abnormal   Collection Time:  06/19/16  6:16 AM  Result Value Ref Range   Glucose-Capillary 129 (H) 65 - 99 mg/dL   Comment 1 Notify RN   Glucose, capillary     Status: Abnormal   Collection Time: 06/19/16  4:59 PM  Result Value Ref Range   Glucose-Capillary 107 (H) 65 - 99 mg/dL   Comment 1 Notify RN    Comment 2 Document in Chart   Glucose, capillary     Status: Abnormal   Collection Time: 06/20/16  6:26 AM  Result Value Ref Range   Glucose-Capillary 128 (H) 65 - 99 mg/dL    Blood Alcohol level:  Lab Results  Component Value Date   ETH <5 31/59/4585    Metabolic Disorder Labs: Lab Results  Component Value Date   HGBA1C 7.6 (H) 05/30/2016   No results found for: PROLACTIN Lab Results  Component Value Date   CHOL 203 (H) 10/10/2011   TRIG 123 10/10/2011   HDL 48 10/10/2011   CHOLHDL 4.2 10/10/2011   VLDL 25 10/10/2011   LDLCALC 130 (H) 10/10/2011   Physical Findings: AIMS: Facial and Oral Movements Muscles of Facial Expression: None, normal Lips and Perioral Area: None, normal Jaw: None, normal Tongue: None, normal,Extremity Movements Upper (arms, wrists, hands, fingers): None, normal Lower (legs, knees, ankles, toes): None, normal, Trunk Movements Neck, shoulders, hips: None, normal, Overall Severity Severity of abnormal movements (highest score from questions above): None, normal Incapacitation due to abnormal movements: None, normal Patient's awareness of abnormal movements (rate only patient's report): No  Awareness, Dental Status Current problems with teeth and/or dentures?: No Does patient usually wear dentures?: No  CIWA:  CIWA-Ar Total: 1 COWS:  COWS Total Score: 2  Musculoskeletal: Strength & Muscle Tone: within normal limits Gait & Station: normal Patient leans: N/A  Psychiatric Specialty Exam: Physical Exam  ROS no chest pain, no dyspnea, no bleeding or bruising , reports diffuse, chronic aches/pain.   Blood pressure 131/89, pulse 95, temperature 98.5 F (36.9 C), temperature source Oral, resp. rate 18, height 5' 8" (1.727 m), weight 113.4 kg (250 lb), SpO2 100 %.Body mass index is 38.01 kg/m.  General Appearance: Well Groomed  Eye Contact:  Good  Speech:  Normal Rate  Volume:  Normal  Mood:  partially improved since admission, remains somewhat depressed, anxious   Affect:  constricted , anxious, but reactive, improves during session, smiles at times appropriately   Thought Process:  Goal Directed and Descriptions of Associations: Intact  Orientation:  Other:  fully alert and attentive   Thought Content:  no hallucinations , no delusions, not internally preoccupied, still ruminative about marital /family stressors   Suicidal Thoughts:  No denies active suicidal or self injurious ideations, denies any violent or homicidal ideations towards husband or his mother   Homicidal Thoughts:  No  Memory:  recent and remote grossly intact   Judgement:  Other:  improving   Insight:  improving   Psychomotor Activity:  Normal  Concentration:  Concentration: Good and Attention Span: Good  Recall:  Good  Fund of Knowledge:  Good  Language:  Good  Akathisia:  Negative  Handed:  Right  AIMS (if indicated):     Assets:  Communication Skills Desire for Improvement Resilience  ADL's:  Intact  Cognition:  WNL  Sleep:  Number of Hours: 6.75   Assessment - patient reports overall progress since admission, and has presented with gradually improving mood and affect. Today feeling more  depressed, anxious, after some statements her husband made  during recent visitation which made her feel as if he does not acknowledge her concerns about her mother in law. She states , however, that she feels she is gaining better insight into family dynamics, her own emotional responses, and is working on Doctor, general practice, she does appear more future oriented, speaking about returning to college, at this time denies any SI. Thus far tolerating medications well , but expressing interest in trying Cymbalta, to address pain in addition to her depression, side effects discussed .    Treatment Plan Summary: Treatment plan reviewed as below today 4/11.  Daily contact with patient to assess and evaluate symptoms and progress in treatment, Medication management, Plan inpatient treatment  and medications as below Continue to encourage group and milieu participation to work on coping skills and symptom reduction D/C Lexapro- see above Start Cymbalta 30 mgrs  QDAY initially - see rationale above  Continue Neurontin 800 mgrs TID for anxiety , pain Change Buspar to 15 mgrs  BID for anxiety Continue management with Xarelto for history of DVT Continue Glucophage for management of DM Treatment team working on disposition planning options  Jenne Campus, MD 06/20/2016, 4:26 PM  Patient ID: Darnelle Catalan, female   DOB: 23-Sep-1965, 51 y.o.   MRN: [id number]

## 2016-06-20 NOTE — [facility] Group Notes (Signed)
[facility] LCSW Group Therapy 06/20/2016 1:15 PM  Type of Therapy: Group Therapy- Emotion Regulation  Participation Level: Active   Participation Quality:  Appropriate  Affect: Appropriate  Cognitive: Alert and Oriented   Insight:  Developing/Improving  Engagement in Therapy: Developing/Improving and Engaged   Modes of Intervention: Clarification, Confrontation, Discussion, Education, Exploration, Limit-setting, Orientation, Problem-solving, Rapport Building, Dance movement psychotherapist, Socialization and Support  Summary of Progress/Problems: The topic for group today was emotional regulation. This group focused on both positive and negative emotion identification and allowed group members to process ways to identify feelings, regulate negative emotions, and find healthy ways to manage internal/external emotions. Group members were asked to reflect on a time when their reaction to an emotion led to a negative outcome and explored how alternative responses using emotion regulation would have benefited them. Group members were also asked to discuss a time when emotion regulation was utilized when a negative emotion was experienced. Pt states that she wishes her family would be more supportive of her mental health concerns. Pt states that she would like validation of her concerns, but her family continually ignores her. Pt identified sadness as an emotion that she has a difficult time regulating. She mentioned that reaching out to others could help with her sadness but she is afraid of rejection.   Jonathon Jordan, MSW, LCSWA 06/20/2016 3:04 PM

## 2016-06-20 NOTE — Discharge Summary (Addendum)
Physician Discharge Summary Note  Patient:  Deborah Soto is an 51 y.o., female MRN:  [id number] DOB:  05-07-1965 Patient phone:  [phone] (home)  Patient address:   [address] [place] [address],  Total Time spent with patient:  Greater than 30 minutes  Date of Admission:  06/14/2016 Date of Discharge: 06-20-16  Reason for Admission: Suicide attempt by overdose.  Principal Problem: Intentional drug overdose [facility])  Discharge Diagnoses: Patient Active Problem List   Diagnosis Date Noted   MDD (major depressive disorder), recurrent severe, without psychosis ([place]) [F33.2] 06/14/2016    Priority: High   Intentional drug overdose ([place]) [T50.902A]    Acute encephalopathy [G93.40] 06/11/2016   Diabetes mellitus type 2 in obese ([place]) [E11.69, E66.9] 06/11/2016   Suicidal ideation [R45.851] 06/11/2016   Positive ANA (antinuclear antibody) [R76.8] 04/02/2016   Hyperparathyroidism ([place]) [E21.3] 01/09/2016   Snoring [R06.83] 09/20/2015   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] 05/12/2015   Hepatic steatosis [K76.0] 04/18/2015   Left sided chest pain [R07.9]    Dyspnea [R06.00]    Achilles tendonitis [M76.60] 04/08/2012   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  Past Medical History:  Past Medical History:  Diagnosis Date   Anxiety    Asthma    Depression    Diabetes mellitus type 2 in obese ([place]) 06/11/2016   Embolism - blood clot January 1997 & January 2017   Also had LLE DVT in 1997   Hypertension    Sleep apnea     Past Surgical History:  Procedure Laterality Date   NO PAST SURGERIES     Family History:   Family History  Problem Relation Age of Onset   Dementia Father    Heart disease Father    Clotting disorder Mother    Arthritis Mother    Clotting disorder Sister    Clotting disorder Maternal Grandmother    Clotting disorder Maternal Aunt    Rheumatologic disease Neg Hx    Hyperparathyroidism Neg Hx    Family Psychiatric  History: See H&P  Social History:  History  Alcohol Use   0.6 oz/week   1 Cans of beer per week    Comment: 1-2 times a week     History  Drug Use No    Social History   Social History   Marital status: Married    Spouse name: N/A   Number of children: Y   Years of education: N/A   Occupational History   admin assist    Social History Main Topics   Smoking status: Former Smoker    Packs/day: 1.00    Years: 20.00    Quit date: 03/12/2004   Smokeless tobacco: Never Used   Alcohol use 0.6 oz/week    1 Cans of beer per week     Comment: 1-2 times a week   Drug use: No   Sexual activity: Not Asked   Other Topics Concern   None   Social History Narrative   Originally from [place]. Always lived in [place]. 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 No international travel recently. Previously has been to [place] in May 1996. No mold exposure recently. No bird exposure. Does have multiple pets.  Hospital Course: This is an admission assessment for this 51 year old Caucasian female with hx of chronic depression, suicide attempts & self injurious behaviors. She is being admitted to the [facility] adult unit from the [facility] with complaints of suicide attempt by overdose on Klonopin. She is here for mood stabilization treatments. During this admission assessment, Deborah Soto reports, "I think I was taken to the ED by an ambulance last Monday. My husband called the EMS when he was unable to wake me up. I can't remember all that happened. However, I had taken a whole bottle of Klonopin to kill myself. There have been some things going on in my life. I had  an argument with my husband & kids prior to the overdose. I have been depressed x 12 years. This is my third psychiatric hospitalization. I have had ECT about 4 years ago. It was a horrible experience. It did not help the depression or improve my mood. It caused me memory issues, I could not remember a lot of things. I was diagnosed with depression in high school at the age of 8. That time, I cut my wrist. I had a hard time growing up. Nothing I did is good enough for my parents. I was never allowed to socialize with the other kids or allowed to go to church. I had attempted suicide x 5 in my life time. I was recently diagnosed with sleep apnea & Diabetes. I think these helped worsen my mood. I'm receiving Mental health care at the [facility]. I have been tried on all the antidepressants out there. I'm currently on Lexapro & Buspar".  Deborah Soto was admitted to the [facility] adult unit with complaints of suicide attempt by overdose on Klonopin. She cited familial related stressors as the trigger. She presented to the ED unresponsive after the intentional overdose & with history of mental illness for over 12 years.  She has been receiving mental health care at the [facility]. She was in need of mood stabilization treatments.   During the course of her hospitalization, Deborah Soto was medicated & discharged on, Duloxetine 30 mg for depression, Hydroxyzine 25 mg prn for anxiety, Gabapentin 800 mg for agitation & Trazodone 50 mg insomnia. She was enrolled & participated in the group counseling sessions being offered & held on this unit. She was counseled & learned coping skills that should help her cope better & maintain safety & mood stability after discharge. She was resumed on all her pertinent home medications for the other previously existing medical issues that she presented. She tolerated her treatment regimen without any adverse effects reported.   While her treatment was on going, Deborah Soto's improvement was  monitored by observation & her daily reports of symptom reduction noted.  Her emotional & mental status were monitored by daily self-inventory reports completed by her & the clinical staff. She was evaluated daily by the treatment team for mood stability & the need for continued recovery after discharge. Her motivation was an integral factor in her recovery & mood stability. She was offered further treatment options upon discharge & will continue to follow up with the outpatient psychiatric services as listed below.     Upon discharge, Faiga was both mentally & medically stable. She is currently denying suicidal, homicidal ideation, auditory, visual/tactile hallucinations, delusional thoughts & or paranoia. She left [facility] with all personal belongings in no apparent distress. Transportation per her arrangement.       Physical Findings: AIMS: Facial and Oral Movements Muscles  of Facial Expression: None, normal Lips and Perioral Area: None, normal Jaw: None, normal Tongue: None, normal,Extremity Movements Upper (arms, wrists, hands, fingers): None, normal Lower (legs, knees, ankles, toes): None, normal, Trunk Movements Neck, shoulders, hips: None, normal, Overall Severity Severity of abnormal movements (highest score from questions above): None, normal Incapacitation due to abnormal movements: None, normal Patient's awareness of abnormal movements (rate only patient's report): No Awareness, Dental Status Current problems with teeth and/or dentures?: No Does patient usually wear dentures?: No  CIWA:  CIWA-Ar Total: 1 COWS:  COWS Total Score: 2  Musculoskeletal: Strength & Muscle Tone: within normal limits Gait & Station: normal Patient leans: N/A  Psychiatric Specialty Exam: Physical Exam  Constitutional: She is oriented to person, place, and time. She appears well-developed.  Obese  HENT:  Head: Normocephalic.  Eyes: Pupils are equal, round, and reactive to light.  Neck: Normal range  of motion.  Cardiovascular: Normal rate.   Respiratory: Effort normal.  GI: Soft.  Genitourinary:  Genitourinary Comments: Deferred  Musculoskeletal: Normal range of motion.  Neurological: She is alert and oriented to person, place, and time.  Skin: Skin is warm and dry.    Review of Systems  Constitutional: Negative.   HENT: Negative.   Eyes: Negative.   Respiratory: Negative.   Cardiovascular: Negative.   Gastrointestinal: Negative.   Genitourinary: Negative.   Musculoskeletal: Negative.   Skin: Negative.   Neurological: Negative.   Endo/Heme/Allergies: Negative.   Psychiatric/Behavioral: Positive for depression (Stable). Negative for hallucinations, memory loss, substance abuse and suicidal ideas. The patient has insomnia (Stable). The patient is not nervous/anxious.     Blood pressure 123/65, pulse 92, temperature 98.7 F (37.1 C), temperature source Oral, resp. rate 18, height '5\' 8"'$  (1.727 m), weight 113.4 kg (250 lb), SpO2 100 %.Body mass index is 38.01 kg/m.  See Md's SRA   Have you used any form of tobacco in the last 30 days? (Cigarettes, Smokeless Tobacco, Cigars, and/or Pipes): No  Has this patient used any form of tobacco in the last 30 days? (Cigarettes, Smokeless Tobacco, Cigars, and/or Pipes): No  Blood Alcohol level:  Lab Results  Component Value Date   ETH <5 03/50/0938   Metabolic Disorder Labs:  Lab Results  Component Value Date   HGBA1C 7.6 (H) 05/30/2016   No results found for: PROLACTIN Lab Results  Component Value Date   CHOL 203 (H) 10/10/2011   TRIG 123 10/10/2011   HDL 48 10/10/2011   CHOLHDL 4.2 10/10/2011   VLDL 25 10/10/2011   LDLCALC 130 (H) 10/10/2011   See Psychiatric Specialty Exam and Suicide Risk Assessment completed by Attending Physician prior to discharge.  Discharge destination:  Home  Is patient on multiple antipsychotic therapies at discharge:  No   Has Patient had three or more failed trials of antipsychotic  monotherapy by history:  No  Recommended Plan for Multiple Antipsychotic Therapies: NA  Allergies as of 06/21/2016       Reactions   Tramadol Other (See Comments)   Tingling all over         Medication List     STOP taking these medications    blood glucose meter kit and supplies Kit   escitalopram 10 MG tablet Commonly known as:  LEXAPRO   gabapentin 600 MG tablet Commonly known as:  NEURONTIN Replaced by:  gabapentin 400 MG capsule   lidocaine 5 % Commonly known as:  LIDODERM   MAGNESIUM PO   methocarbamol 750 MG tablet Commonly known as:  ROBAXIN-750   ONETOUCH VERIO test strip Generic drug:  glucose blood   VITAMIN B-12 CR PO   Vitamin D3 5000 units Caps       TAKE these medications      Indication  busPIRone 15 MG tablet Commonly known as:  BUSPAR Take 1 tablet (15 mg total) by mouth 2 (two) times daily. For anxiety What changed: medication strength how much to take additional instructions  Indication:  Generalized Anxiety Disorder   DULoxetine 30 MG capsule Commonly known as:  CYMBALTA Take 1 capsule (30 mg total) by mouth daily. For depression Start taking on:  06/22/2016  Indication:  Major Depressive Disorder   gabapentin 400 MG capsule Commonly known as:  NEURONTIN Take 2 capsules (800 mg total) by mouth 3 (three) times daily. For agitation Replaces:  gabapentin 600 MG tablet  Indication:  Agitation   hydrOXYzine 25 MG tablet Commonly known as:  ATARAX/VISTARIL Take 1 tablet (25 mg total) by mouth every 6 (six) hours as needed for anxiety.  Indication:  Anxiety Neurosis   metFORMIN 500 MG tablet Commonly known as:  GLUCOPHAGE Take 2 tablets (1000 mg) in the morning & 1 tablet (500 mg) at bedtime: For diabetes management What changed: how much to take how to take this when to take this additional instructions  Indication:  Type 2 Diabetes   rivaroxaban 20 MG Tabs tablet Commonly known as:  XARELTO TAKE 1 TABLET DAILY WITH  SUPPER: Blood thiner What changed:  See the new instructions.  Indication:  Blood Clot in a Deep Vein   traZODone 50 MG tablet Commonly known as:  DESYREL Take 1 tablet (50 mg total) by mouth at bedtime as needed for sleep.  Indication:  Trouble Sleeping       Follow-up Information     [facility] Follow up.   Specialty:  Behavioral Health Why:  Appointment is July 04, 2016 at 9:00am with Dr. Sumner Boast information: [address] [place] [address] [phone]         [facility] Follow up.   Why:  Appointment is June 26, 2016 at 10:00 am with your therapist Anson Fret.  Contact information: [address] [place] [address] [phone]         [facility] PSY[facility]. Go on 06/22/2016.   Specialty:  Behavioral Health Why:  Initial assessment for Partial Hospitalization Program on 4/13 at 9 AM w [facility].  Please bring insurance card.  Contact information: [address] [id number] mc [place] [address] [phone]          Follow-up recommendations: Activity:  As tolerated Diet: As recommended by your primary care doctor. Keep all scheduled follow-up appointments as recommended.   Comments: Patient is instructed prior to discharge to: Take all medications as prescribed by his/her mental healthcare provider. Report any adverse effects and or reactions from the medicines to his/her outpatient provider promptly. Patient has been instructed & cautioned: To not engage in alcohol and or illegal drug use while on prescription medicines. In the event of worsening symptoms, patient is instructed to call the crisis hotline, 911 and or go to the nearest ED for appropriate evaluation and treatment of symptoms. To follow-up with his/her primary care provider for your other medical issues, concerns and or health care needs.   Signed: Sanjuana Kava, NP, PMHNP,  FNP-BC 06/21/2016, 3:32 PM   Patient seen, Suicide Assessment Completed.  Disposition Plan Reviewed  09/09/2020 ADDENDUM Contacted by Medical Records . Patient has submitted a request to addend medical records pertaining to  diagnostic considerations for this inpatient admission, reporting correct diagnosis is MDD , rather than Bipolar Disorder . Chart reviewed :at time of admission , Dr Clovis Pu, her outpatient psychiatrist,provided information- he reported bipolarity suspected, with recent increased affective lability on Fetzima. She was discharged on Cymbalta , with recommendation of continuing outpatient management with Dr Clovis Pu, whom she has continued to follow up with. I note as per chart review, that diagnosis listed for these  visits is MDD.

## 2016-06-20 NOTE — [facility] Group Notes (Signed)
[facility] LCSW Aftercare Discharge Planning Group Note   06/20/2016  8:45 AM  Participation Quality: Active  Mood/Affect:  Tearful  Depression Rating:  8  Anxiety Rating: 10  Thoughts of Suicide:  Pt endorses passive SI.  Current AVH:  No  Plan for Discharge/Comments: Pt presented as very depressed and cried during most of her time sharing with the group. Pt states that she got a phone call from her husband and he said that when she returns home she should be patient with their daughter because she is still very mad at the pt. Pt explained that she doesn't want to have to tip toe around her daughter's feelings when she is still focusing on her own recovery. "I just take two steps forward and 4 steps back. I can never get well if my family will never change."  Transportation Means: Pt has access to transporation  Supports: Mental health providers   Jonathon Jordan, MSW, LCSWA 06/20/2016 10:41 AM

## 2016-06-20 NOTE — Progress Notes (Signed)
Pt attend wrap up group. Her day was a 6. Her goal was not to let things enterfer with her day. She had a poor job trying to accomplish this goal today. Staff suggest she talk to her nurse tonight. The nurse will be very helpful.

## 2016-06-20 NOTE — Progress Notes (Signed)
This evening, pt reports that she is not sure if she is ready to be discharged.  She said that something happened earlier in the day and during group that affected her and made her rethink how she was feeling.  She was not specific about what had happened.  She said that if she were discharged tomorrow, she was not sure what she would do.  The MHT reported that later in the shift toward bedtime, pt was tearful and her peers were trying to comfort her.  Again, she has not come forward to staff with what is bothering her.  At the beginning of the shift, pt was in the dayroom talking with peers and laughing.  She had initially reported a good day.  Pt requested Tylenol and Trazodone with her scheduled meds which she was given.  She has otherwise been appropriate.  She makes her needs known to staff.  Support and encouragement offered.  Discharge plans are in process.  Pt plans to return home at discharge.  Safety maintained with q15 minute checks.

## 2016-06-20 NOTE — Progress Notes (Signed)
Pt reports she is doing better this evening and is feeling better about discharge.  She has been in the dayroom talking with peers and watching TV.  She says her husband visited tonight and is willing to be more supportive of her when it comes to the children and his mother.  She says she would like to go back to school, but is reluctant at her age.  Writer shared that many people go back to school later in life, but sometimes we have to step out of our comfort zone in order to do so.  Pt denies SI/HI/AVH at this time.  Support and encouragement offered.  Discharge plans are in process.  Safety maintained with q15 minute checks.

## 2016-06-20 NOTE — Progress Notes (Signed)
Patient ID: Deborah Soto, female   DOB: 02-19-1966, 51 y.o.   MRN: [id number]  Patient continues to report passive SI. States that she ins not ready to be discharged and doesn't know what she might do. Patient has flat, depressed sad affect and mood. Rated her depression a 7 and hopelessness 9. Spends time in the dayroom talking with peers. Patient contracts for safety. A. Support offered. Meds given as ordered. Patient encouraged to come to staff with issues or concerns. R. Will continue to monitor. Patient safe on the unit.

## 2016-06-21 LAB — GLUCOSE, CAPILLARY: Glucose-Capillary: 136 mg/dL — ABNORMAL HIGH (ref 65–99)

## 2016-06-21 MED ORDER — DULOXETINE HCL 30 MG PO CPEP: 30.0000 mg | ORAL_CAPSULE | Freq: Every day | ORAL | 0 refills | Status: DC

## 2016-06-21 MED ORDER — BUSPIRONE HCL 15 MG PO TABS: 15.0000 mg | ORAL_TABLET | Freq: Two times a day (BID) | ORAL | 0 refills | Status: DC

## 2016-06-21 NOTE — BHH Suicide Risk Assessment (Addendum)
[facility] Discharge Suicide Risk Assessment   Principal Problem: Intentional drug overdose [facility]) Discharge Diagnoses:  Patient Active Problem List   Diagnosis Date Noted   MDD (major depressive disorder), recurrent severe, without psychosis (HCC) [F33.2] 06/14/2016   Intentional drug overdose (HCC) [T50.902A]    Acute encephalopathy [G93.40] 06/11/2016   Diabetes mellitus type 2 in obese (HCC) [E11.69, E66.9] 06/11/2016   Suicidal ideation [R45.851] 06/11/2016   Positive ANA (antinuclear antibody) [R76.8] 04/02/2016   Hyperparathyroidism (HCC) [E21.3] 01/09/2016   Snoring [R06.83] 09/20/2015   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] 05/12/2015   Hepatic steatosis [K76.0] 04/18/2015   Left sided chest pain [R07.9]    Dyspnea [R06.00]    Achilles tendonitis [M76.60] 04/08/2012   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] 04/08/2012   PTE (pulmonary thromboembolism) (HCC) [I26.99] 10/10/2011   Preeclampsia [O14.90] 10/10/2011   Diverticulitis [K57.92] 10/10/2011   Bipolar affective disorder (HCC) [F31.9] 10/10/2011   BMI 34.0-34.9,adult [Z68.34] 10/10/2011   AR (allergic rhinitis) [J30.9] 10/10/2011    Total Time spent with patient: 30 minutes  Musculoskeletal: Strength & Muscle Tone: within normal limits Gait & Station: normal Patient leans: N/A  Psychiatric Specialty Exam: ROS  No vomiting, no fever, no chills , no abnormal bleeding or bruising  Blood pressure 123/65, pulse 92, temperature 98.7 F (37.1 C), temperature source Oral, resp. rate 18, height  (1.727 m), weight 113.4 kg (250 lb), SpO2 100 %.Body mass index is 38.01 kg/m.  General Appearance:  improved grooming   Eye Contact::  Good  Speech:  Normal Rate409  Volume:  Normal  Mood:   improved compared to admission  - states she is feeling better  Affect:   more reactive, still anxious  Thought Process:  Linear and Descriptions of Associations: Intact  Orientation:  Full (Time, Place, and Person)   Thought Content:   denies hallucinations, no delusions, not internally preoccupied   Suicidal Thoughts:  No denies suicidal or self injurious ideations, denies any homicidal or violent ideations  Homicidal Thoughts:  No  Memory:   recent and remote grossly intact   Judgement:  Other:  improving   Insight:   improving   Psychomotor Activity:  Normal  Concentration:  Good  Recall:  Good  Fund of Knowledge:Good  Language: Good  Akathisia:  Negative  Handed:  Right  AIMS (if indicated):     Assets:  Communication Skills Desire for Improvement Resilience  Sleep:  Number of Hours: 6.25  Cognition: WNL  ADL's:  Intact   Mental Status Per Nursing Assessment::   On Admission:  NA  Demographic Factors:  51 year old female, married, lives at home with husband and her children   Loss Factors: Marital stressors, strained  relationship with her mother in Social worker .   Historical Factors: History of Mood Disorder   Risk Reduction Factors:   Responsible for children under 30 years of age, Sense of responsibility to family, Living with another person, especially a relative, Positive social support and Positive coping skills or problem solving skills  Continued Clinical Symptoms:  Patient is presenting improved compared to admission - presents alert , attentive, with improved mood and range of affect, although still vaguely anxious. States she is feeling significantly better than she did prior to admission . No thought disorder, no suicidal or self injurious ideations, no violent or homicidal ideations, she is future oriented. Denies medication side effects at this time . Tolerating Cymbalta trial well thus far . Behavior on unit in good control,  interacting appropriately with peers, calm and pleasant on approach . States she is making progress in identifying how she may interpret meaning and criticism in statements made by mother in law and husband that may not be intended as such.  Expresses  desire to work on marriage by going to couples therapy. I have encouraged her to discuss further medication options with her outpatient psychiatrist, Dr. Marc Morgans, who had discussed possible mood stabilizer ( Oxcarbamazepine) trial.   Cognitive Features That Contribute To Risk:  No gross cognitive deficits noted upon discharge. Is alert , attentive, and oriented x 3     Suicide Risk:  Mild:  Suicidal ideation of limited frequency, intensity, duration, and specificity.  There are no identifiable plans, no associated intent, mild dysphoria and related symptoms, good self-control (both objective and subjective assessment), few other risk factors, and identifiable protective factors, including available and accessible social support.  Follow-up Information     [facility] Follow up.   Specialty:  Behavioral Health Why:  Appointment is July 04, 2016 at 9:00am with Dr. Sumner Boast information: [address] [place] [address] [phone]         [facility] Follow up.   Why:  Appointment is June 26, 2016 at 10:00 am with your therapist Anson Fret.  Contact information: [address] [place] [address] [phone]         BE[facility]IOR[facility] HEALTH INTENSIVE PSYCH. Go on 06/22/2016.   Specialty:  Behavioral Health Why:  Initial assessment for Partial Hospitalization Program on 4/13 at 9 AM w [facility].  Please arrive at 8:15 AM to complete necessary paperwork and bring insurance card.  Contact information: [id number] mc [place] [address] [phone]           Plan Of Care/Follow-up recommendations:  Activity:  as tolerated  Diet:  Regular Tests:  Na Other:  See below Patient is leaving unit in good spirits Plans to return home Looking forward to PHP. As above, plans to start couples therapy with her husband as well. Plans to follow up with her PCP regarding ongoing  medical management and monitoring of management with Xarelto.    09/09/2020 ADDENDUM Contacted by Medical Records . Patient has submitted a request to addend medical records pertaining to  diagnostic considerations for this inpatient admission, reporting correct diagnosis is MDD , rather than Bipolar Disorder . Chart reviewed :at time of admission , Dr Jennelle Human, her outpatient psychiatrist,provided information- he reported bipolarity suspected, with recent increased affective lability on Fetzima.  She was discharged on Cymbalta , with recommendation of continuing outpatient management with Dr Jennelle Human, whom she has continued to follow up with. I note as per chart review, that diagnosis listed for these  visits is MDD.  Craige Cotta, MD 06/21/2016, 11:58 AM

## 2016-06-21 NOTE — Progress Notes (Addendum)
  [facility] Adult Case Management Discharge Plan :  Will you be returning to the same living situation after discharge:  Yes,  Deborah Soto returning home. At discharge, do you have transportation home?: Yes,  Deborah Soto has access to transportation. Do you have the ability to pay for your medications: Yes,  Deborah Soto has insurance.  Release of information consent forms completed and in the chart;  Patient's signature needed at discharge.  Patient to Follow up at: Follow-up Information    [facility] Follow up.   Specialty:  Behavioral Health Why:  Appointment is July 04, 2016 at 9:00am with Dr. Sumner Boast information: [address] [place] [address] [phone]        [facility] Follow up.   Why:  Appointment is June 26, 2016 at 10:00 am with your therapist Anson Fret.  Contact information: [address] [place] [address] [phone]        BEHAVIORAL HEALTH INTENSIVE PSYCH. Go on 06/22/2016.   Specialty:  Behavioral Health Why:  Initial assessment for Partial Hospitalization Program on 4/13 at 9 AM w [facility].  Please bring insurance card.  Contact information: [address] [phone] mc [place] [address] [phone]          Next level of care provider has access to [facility] Link:yes  Safety Planning and Suicide Prevention discussed: Yes,  with Deborah Soto and with Deborah Soto's husband.  Have you used any form of tobacco in the last 30 days? (Cigarettes, Smokeless Tobacco, Cigars, and/or Pipes): No  Has patient been referred to the Quitline?: N/A patient is not a smoker  Patient has been referred for addiction treatment: N/A  Jonathon Jordan, MSW, LCSWA 06/21/2016, 11:27 AM

## 2016-06-21 NOTE — Progress Notes (Signed)
Pt. Discharged per MD orders;  PT. Currently denies any HI/SI or AVH.  Pt. Was given education regarding follow up appointments and medications by RN.  Pt. Denies any questions or concerns about the medications.  Pt. Was escorted to the search room to retrieve her belongings by RN before being discharged to the hospital lobby.  

## 2016-06-21 NOTE — Social Work (Signed)
Patient concerned that she has to wait a week for initial assessment for entrance into [facility] OP clinic, CSW staff messaged provider to request earlier appointment if possible.  Santa Genera, LCSW Lead Clinical Social Worker Phone:  [phone]

## 2016-06-21 NOTE — Tx Team (Signed)
Interdisciplinary Treatment and Diagnostic Plan Update 06/21/2016 Time of Session: 9:30am  Deborah Soto  MRN: [id number]  Principal Diagnosis: Intentional drug overdose [facility])  Secondary Diagnoses: Principal Problem:   Intentional drug overdose (HCC) Active Problems:   MDD (major depressive disorder), recurrent severe, without psychosis (HCC)   Current Medications:  Current Facility-Administered Medications  Medication Dose Route Frequency Provider Last Rate Last Dose  . acetaminophen (TYLENOL) tablet 650 mg  650 mg Oral Q6H PRN Laveda Abbe, NP   650 mg at 06/19/16 2128  . alum & mag hydroxide-simeth (MAALOX/MYLANTA) 200-200-20 MG/5ML suspension 30 mL  30 mL Oral Q4H PRN Laveda Abbe, NP      . busPIRone (BUSPAR) tablet 15 mg  15 mg Oral BID Craige Cotta, MD   15 mg at 06/21/16 0814  . DULoxetine (CYMBALTA) DR capsule 30 mg  30 mg Oral Daily Craige Cotta, MD   30 mg at 06/21/16 0813  . gabapentin (NEURONTIN) capsule 800 mg  800 mg Oral TID Craige Cotta, MD   800 mg at 06/21/16 0814  . hydrOXYzine (ATARAX/VISTARIL) tablet 25 mg  25 mg Oral Q6H PRN Laveda Abbe, NP   25 mg at 06/20/16 1819  . magnesium hydroxide (MILK OF MAGNESIA) suspension 30 mL  30 mL Oral Daily PRN Laveda Abbe, NP      . menthol-cetylpyridinium (CEPACOL) lozenge 3 mg  1 lozenge Oral PRN Laveda Abbe, NP      . metFORMIN (GLUCOPHAGE) tablet 1,000 mg  1,000 mg Oral Q breakfast Sanjuana Kava, NP   1,000 mg at 06/21/16 0814  . metFORMIN (GLUCOPHAGE) tablet 500 mg  500 mg Oral QPM Sanjuana Kava, NP   500 mg at 06/20/16 1706  . rivaroxaban (XARELTO) tablet 20 mg  20 mg Oral Q supper Laveda Abbe, NP   20 mg at 06/20/16 1702  . traZODone (DESYREL) tablet 50 mg  50 mg Oral QHS PRN Laveda Abbe, NP   50 mg at 06/20/16 2106    PTA Medications: Prescriptions Prior to Admission  Medication Sig Dispense Refill Last Dose  . blood glucose meter kit and  supplies KIT Dispense based on patient and insurance preference. Use up to four times daily as directed. (FOR ICD-9 250.00, 250.01). 1 each 0 Taking  . busPIRone (BUSPAR) 30 MG tablet Take 30 mg by mouth 2 (two) times daily.   06/10/2016 at Unknown time  . Cholecalciferol (VITAMIN D3) 5000 units CAPS Take 10,000 Units by mouth daily.    Past Week at Unknown time  . Cyanocobalamin (VITAMIN B-12 CR PO) Take 2 tablets by mouth daily.    Past Week at Unknown time  . escitalopram (LEXAPRO) 10 MG tablet Take 10 mg by mouth at bedtime.    06/10/2016 at Unknown time  . gabapentin (NEURONTIN) 600 MG tablet Take 2 tablets (1,200 mg total) by mouth 3 (three) times daily. 180 tablet 1 06/10/2016 at Unknown time  . lidocaine (LIDODERM) 5 % Place 1 patch onto the skin daily. Remove & Discard patch within 12 hours or as directed by MD 30 patch 0 Past Week at Unknown time  . MAGNESIUM PO Take 1 tablet by mouth daily.   Past Week at Unknown time  . metFORMIN (GLUCOPHAGE) 500 MG tablet Take 1 tablet (500 mg total) by mouth 2 (two) times daily with a meal. Initial 1 tablet for the first week. (Patient taking differently: Take 500-1,000 mg by mouth 2 (two) times  daily with a meal. Take 1 tablet in the am and Take 2 tablets in the evening.) 60 tablet 1 06/10/2016 at Unknown time  . methocarbamol (ROBAXIN-750) 750 MG tablet Take 1 tablet (750 mg total) by mouth every 8 (eight) hours as needed for muscle spasms. 90 tablet 1 Past Month at Unknown time  . ONETOUCH VERIO test strip CHECK BLOOD SUGAR UP TO 4 TIMES A DAY AS DIRECTED. 100 each 3 Taking  . [DISCONTINUED] XARELTO 20 MG TABS tablet TAKE 1 TABLET DAILY WITH SUPPER. 90 tablet 1 06/10/2016 at 2030    Treatment Modalities: Medication Management, Group therapy, Case management,  1 to 1 session with clinician, Psychoeducation, Recreational therapy.  Patient Stressors: Marital or family conflict Patient Strengths: Agricultural engineer for treatment/growth Supportive  family/friends  Physician Treatment Plan for Primary Diagnosis: Intentional drug overdose ([place]) Long Term Goal(s): Improvement in symptoms so as ready for discharge Short Term Goals: Ability to identify changes in lifestyle to reduce recurrence of condition will improve Ability to verbalize feelings will improve Ability to disclose and discuss suicidal ideas Ability to demonstrate self-control will improve Ability to identify and develop effective coping behaviors will improve Compliance with prescribed medications will improve Ability to identify triggers associated with substance abuse/mental health issues will improve  Medication Management: Evaluate patient's response, side effects, and tolerance of medication regimen.  Therapeutic Interventions: 1 to 1 sessions, Unit Group sessions and Medication administration.  Evaluation of Outcomes: Adequate for Discharge  Physician Treatment Plan for Secondary Diagnosis: Principal Problem:   Intentional drug overdose ([place]) Active Problems:   MDD (major depressive disorder), recurrent severe, without psychosis ([place])  Long Term Goal(s): Improvement in symptoms so as ready for discharge  Short Term Goals: Ability to identify changes in lifestyle to reduce recurrence of condition will improve Ability to verbalize feelings will improve Ability to disclose and discuss suicidal ideas Ability to demonstrate self-control will improve Ability to identify and develop effective coping behaviors will improve Compliance with prescribed medications will improve Ability to identify triggers associated with substance abuse/mental health issues will improve  Medication Management: Evaluate patient's response, side effects, and tolerance of medication regimen.  Therapeutic Interventions: 1 to 1 sessions, Unit Group sessions and Medication administration.  Evaluation of Outcomes: Adequate for Discharge  RN Treatment Plan for Primary Diagnosis:  Intentional drug overdose ([place]) Long Term Goal(s): Knowledge of disease and therapeutic regimen to maintain health will improve  Short Term Goals: Ability to verbalize feelings will improve and Compliance with prescribed medications will improve  Medication Management: RN will administer medications as ordered by provider, will assess and evaluate patient's response and provide education to patient for prescribed medication. RN will report any adverse and/or side effects to prescribing provider.  Therapeutic Interventions: 1 on 1 counseling sessions, Psychoeducation, Medication administration, Evaluate responses to treatment, Monitor vital signs and CBGs as ordered, Perform/monitor CIWA, COWS, AIMS and Fall Risk screenings as ordered, Perform wound care treatments as ordered.  Evaluation of Outcomes: Adequate for Discharge  LCSW Treatment Plan for Primary Diagnosis: Intentional drug overdose ([place]) Long Term Goal(s): Safe transition to appropriate next level of care at discharge, Engage patient in therapeutic group addressing interpersonal concerns. Short Term Goals: Engage patient in aftercare planning with referrals and resources, Increase emotional regulation, Identify triggers associated with mental health/substance abuse issues and Increase skills for wellness and recovery  Therapeutic Interventions: Assess for all discharge needs, 1 to 1 time with Social worker, Explore available resources and support systems, Assess for adequacy  in community support network, Educate family and significant other(s) on suicide prevention, Complete Psychosocial Assessment, Interpersonal group therapy.  Evaluation of Outcomes: Adequate for Discharge  Progress in Treatment: Attending groups: Yes Participating in groups: Yes Taking medication as prescribed: Yes, MD continues to assess for medication changes as needed Toleration medication: Yes, no side effects reported at this time Family/Significant other  contact made: Yes, pt's husband contacted. Patient understands diagnosis: Yes, AEB pt's willingness to participate in treatment. Discussing patient identified problems/goals with staff: Yes Medical problems stabilized or resolved: Yes Denies suicidal/homicidal ideation: Yes Issues/concerns per patient self-inventory: None Other: N/A  New problem(s) identified: None identified at this time.   New Short Term/Long Term Goal(s): None identified at this time.   Discharge Plan or Barriers: Pt will return home and follow up outpatient with [facility].  06/22/11: Pt will return home and follow up outpatient with [facility], and [facility].  Reason for Continuation of Hospitalization:  None identified.  Estimated Length of Stay: 0 days  Attendees: Patient: 06/21/2016 11:36 AM  Physician: Dr. Parke Poisson 06/21/2016 11:36 AM  Nursing: Chrys Racer RN; [place], RN 06/21/2016 11:36 AM  RN Care Manager: Lars Pinks, RN 06/21/2016 11:36 AM  Social Worker: Matthew Saras, [employer] 06/21/2016 11:36 AM  Recreational Therapist:  06/21/2016 11:36 AM  Other: Lindell Spar, NP; Samuel Jester, NP 06/21/2016 11:36 AM  Other:  06/21/2016 11:36 AM  Other: 06/21/2016 11:36 AM   Scribe for Treatment Team: Georga Kaufmann, MSW,LCSWA 06/21/2016 11:36 AM

## 2016-06-22 ENCOUNTER — Encounter (HOSPITAL_COMMUNITY): Payer: Self-pay | Admitting: Licensed Clinical Social Worker

## 2016-06-22 ENCOUNTER — Telehealth: Payer: Self-pay | Admitting: *Deleted

## 2016-06-22 ENCOUNTER — Ambulatory Visit (INDEPENDENT_AMBULATORY_CARE_PROVIDER_SITE_OTHER): Payer: 59 | Admitting: Internal Medicine

## 2016-06-22 ENCOUNTER — Encounter: Payer: Self-pay | Admitting: Internal Medicine

## 2016-06-22 ENCOUNTER — Ambulatory Visit (INDEPENDENT_AMBULATORY_CARE_PROVIDER_SITE_OTHER): Payer: 59 | Admitting: Licensed Clinical Social Worker

## 2016-06-22 VITALS — BP 138/110 | HR 92 | Ht 68.0 in | Wt 248.0 lb

## 2016-06-22 DIAGNOSIS — F339 Major depressive disorder, recurrent, unspecified: Secondary | ICD-10-CM | POA: Diagnosis not present

## 2016-06-22 DIAGNOSIS — E668 Other obesity: Secondary | ICD-10-CM

## 2016-06-22 DIAGNOSIS — I2699 Other pulmonary embolism without acute cor pulmonale: Secondary | ICD-10-CM

## 2016-06-22 DIAGNOSIS — R079 Chest pain, unspecified: Secondary | ICD-10-CM

## 2016-06-22 DIAGNOSIS — R030 Elevated blood-pressure reading, without diagnosis of hypertension: Secondary | ICD-10-CM

## 2016-06-22 NOTE — Patient Instructions (Signed)
Your physician recommends that you schedule a follow-up appointment in: 1 month. Please check your blood pressure readings at home, and bring a log of these to your next appointment.
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
## 2016-06-22 NOTE — Telephone Encounter (Signed)
Led to schedule 1 month appointment with Dr. Cecille Rubin box is full and could not leave message---will try again

## 2016-06-22 NOTE — Progress Notes (Signed)
OFFICE NOTE  Chief Complaint:  Tachycardia, chest pain  Primary Care Physician: Trena Platt, PA  HPI:  Deborah Soto is a 51 y.o. 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 Unfortunately she was recently hospitalized for a suicide attempt. She's been struggling a lot with depression and has been suffering chest discomfort. She feels heaviness in her chest which can come on in the central chest and does not necessarily associated with overt anxiety, but can last several hours. Is not worse with exertion or relieved by rest. Some of the pain is sharp and at other times feels like a heaviness. She recently been started on medication for neuropathic pain, Cymbalta and has noticed that her heart rate is been elevated. Today blood pressure was elevated as well as 138/110 initially but came down to 130/88. Her heart rate is in the high 90s. She previously saw my  partner Dr. Elberta Fortis in 2017 for chest pain and underwent a nuclear stress test at that time which was negative. He also had an echocardiogram in January 2017 at the time with pulmonary embolus and showed a normal EF 60-65% without regional wall motion abnormalities.  PMHx:  Past Medical History:  Diagnosis Date  . Anxiety   . Asthma   . Depression   . Diabetes mellitus type 2 in obese (HCC) 06/11/2016  . Embolism - blood clot January 1997 & January 2017   Also had LLE DVT in 1997  . Hypertension   . Sleep apnea     Past Surgical History:  Procedure Laterality Date  . NO PAST SURGERIES      FAMHx:  Family History  Problem Relation Age of Onset  . Dementia Father   . Heart disease Father   . Clotting disorder Mother   . Arthritis Mother   . Clotting disorder Sister   . Clotting disorder Maternal Grandmother   . Clotting disorder Maternal Aunt     . Rheumatologic disease Neg Hx   . Hyperparathyroidism Neg Hx     SOCHx:   reports that she quit smoking about 12 years ago. She has a 20.00 pack-year smoking history. She has never used smokeless tobacco. She reports that she drinks about 0.6 oz of alcohol per week . She reports that she does not use drugs.  ALLERGIES:  Allergies  Allergen Reactions  . Tramadol Other (See Comments)    Tingling all over     ROS: Pertinent items noted in HPI and remainder of comprehensive ROS otherwise negative.  HOME MEDS: Current Outpatient Prescriptions on File Prior to Visit  Medication Sig Dispense Refill  . busPIRone (BUSPAR) 15 MG tablet Take 1 tablet (15 mg total) by mouth 2 (two) times daily. For anxiety 60 tablet 0  . DULoxetine (CYMBALTA) 30 MG capsule Take 1 capsule (30 mg total) by mouth daily. For depression 30 capsule 0  . gabapentin (NEURONTIN) 400 MG capsule Take 2 capsules (800 mg total) by mouth 3 (three) times daily. For agitation 180 capsule 0  . hydrOXYzine (ATARAX/VISTARIL) 25 MG tablet Take 1 tablet (25 mg total) by mouth every 6 (six) hours as needed for anxiety. 60 tablet 0  . metFORMIN (GLUCOPHAGE) 500 MG tablet Take 2 tablets (1000 mg) in the morning & 1 tablet (500 mg) at bedtime: For diabetes management    . rivaroxaban (XARELTO) 20 MG TABS  tablet TAKE 1 TABLET DAILY WITH SUPPER: Blood thiner 1 tablet 0  . traZODone (DESYREL) 50 MG tablet Take 1 tablet (50 mg total) by mouth at bedtime as needed for sleep. 30 tablet 0   No current facility-administered medications on file prior to visit.     LABS/IMAGING: Results for orders placed or performed during the hospital encounter of 06/14/16 (from the past 48 hour(s))  Glucose, capillary     Status: Abnormal   Collection Time: 06/20/16  4:50 PM  Result Value Ref Range   Glucose-Capillary 146 (H) 65 - 99 mg/dL  Glucose, capillary     Status: Abnormal   Collection Time: 06/21/16  6:40 AM  Result Value Ref Range    Glucose-Capillary 136 (H) 65 - 99 mg/dL   No results found.  WEIGHTS: Wt Readings from Last 3 Encounters:  06/22/16 248 lb (112.5 kg)  06/14/16 250 lb (113.4 kg)  06/11/16 247 lb (112 kg)    VITALS: BP (!) 138/110 (BP Location: Right Arm, Patient Position: Sitting, Cuff Size: Large)   Pulse 92   Ht  (1.727 m)   Wt 248 lb (112.5 kg)   LMP  (LMP Unknown) Comment: last period ---  1 year ago (?)  BMI 37.71 kg/m   EXAM: General appearance: alert, no distress and moderately obese Lungs: clear to auscultation bilaterally Heart: regular rate and rhythm Extremities: extremities normal, atraumatic, no cyanosis or edema Skin: Skin color, texture, turgor normal. No rashes or lesions Neurologic: Grossly normal Psych: Pleasant  EKG: Sinus rhythm at 92  ASSESSMENT: 1. Chest pain-unspecified 2. Significant anxiety/depression/history of suicidality 3. Increased labile blood pressures-possibly related to anxiety 4. Moderate obesity 5. Family history of premature coronary disease 6. 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   Deborah Soto has been having on and off chest discomfort but had a reassuring negative nuclear stress testing year ago and an echocardiogram around that time. She was still recovering from pulmonary embolus on Xarelto. She's been compliant with his medicine and I'm not suspicious that her chest pain is cardiac there is a strong family history of this. Blood pressure is been elevated at times and may be labile, possibly related to medications or anxiety. It did seem to come down significantly today with a recheck. I like for her to follow her blood pressures at home and if there is evidence for ongoing hypertension she may need treatment for that. Will plan to follow-up with her in one month. If she continues to have chest discomfort we may consider CT coronary angiogram to give her definitive evaluation of her coronaries. I do not have a high enough suspicion  for coronary disease the cause of her chest pain to recommend cardiac catheterization.  Follow-up one month. Thanks again for the referral.  Chrystie Nose, MD, [facility] Attending Cardiologist [facility]  Chrystie Nose 06/22/2016, 12:20 PM

## 2016-06-25 ENCOUNTER — Other Ambulatory Visit (HOSPITAL_COMMUNITY): Payer: 59 | Attending: Psychiatry | Admitting: Psychiatry

## 2016-06-25 DIAGNOSIS — I10 Essential (primary) hypertension: Secondary | ICD-10-CM | POA: Diagnosis not present

## 2016-06-25 DIAGNOSIS — G4730 Sleep apnea, unspecified: Secondary | ICD-10-CM | POA: Diagnosis not present

## 2016-06-25 DIAGNOSIS — F332 Major depressive disorder, recurrent severe without psychotic features: Secondary | ICD-10-CM

## 2016-06-25 DIAGNOSIS — Z7984 Long term (current) use of oral hypoglycemic drugs: Secondary | ICD-10-CM | POA: Insufficient documentation

## 2016-06-25 DIAGNOSIS — Z86718 Personal history of other venous thrombosis and embolism: Secondary | ICD-10-CM | POA: Diagnosis not present

## 2016-06-25 DIAGNOSIS — Z8249 Family history of ischemic heart disease and other diseases of the circulatory system: Secondary | ICD-10-CM | POA: Diagnosis not present

## 2016-06-25 DIAGNOSIS — E119 Type 2 diabetes mellitus without complications: Secondary | ICD-10-CM | POA: Insufficient documentation

## 2016-06-25 DIAGNOSIS — Z7901 Long term (current) use of anticoagulants: Secondary | ICD-10-CM | POA: Insufficient documentation

## 2016-06-25 DIAGNOSIS — J45909 Unspecified asthma, uncomplicated: Secondary | ICD-10-CM | POA: Insufficient documentation

## 2016-06-25 DIAGNOSIS — Z888 Allergy status to other drugs, medicaments and biological substances status: Secondary | ICD-10-CM | POA: Diagnosis not present

## 2016-06-25 DIAGNOSIS — F411 Generalized anxiety disorder: Secondary | ICD-10-CM | POA: Insufficient documentation

## 2016-06-25 DIAGNOSIS — F329 Major depressive disorder, single episode, unspecified: Secondary | ICD-10-CM | POA: Diagnosis present

## 2016-06-25 DIAGNOSIS — Z79899 Other long term (current) drug therapy: Secondary | ICD-10-CM | POA: Insufficient documentation

## 2016-06-25 MED ORDER — DULOXETINE HCL 60 MG PO CPEP: 60.0000 mg | ORAL_CAPSULE | Freq: Every day | ORAL | 2 refills | Status: DC

## 2016-06-25 NOTE — Psych (Signed)
   [facility] PHP THERAPIST PROGRESS NOTE  Deborah Soto [id number]  Session Time: 9 AM - 2PM  Participation Level: Active  Behavioral Response: CasualAlertDepressed  Type of Therapy: Group Therapy; individual therapy  Treatment Goals addressed: Coping  Interventions: CBT, DBT, Supportive and Reframing  Summary: Clinician facilitated check-in regarding current stressors and situation, and review of patient completed daily inventory. Clinician utilized active listening and empathetic response and validated patient emotions. Clinician facilitated discussion on topics relevant to patients. Clinician and patient discussed her current situation and goals for treatment.  Clinician assessed for immediate needs, medication compliance and efficacy, and safety concerns.    Suicidal/Homicidal: Nowithout intent/plan  Therapist Response: Deborah Soto is a 51 y.o. female who presents with depression and anxiety symptoms. Patient arrived within time allowed and reports she is feeling "okay." Patient rates her mood at a 6 on a 1- 10 scale with 10 being great. Patient engaged in activity and discussion. Patient identified family as her main stressor and some negative thinking from childhood. Patient demonstrates some progress as evidenced by being open and eager to engage in treatment. Patient denies SI/HI/self-harm thoughts   Plan: Patient will continue in PHP and medication management, while working towards decreasing depression symptoms, increasing self worth, and establishing coping skills.     Diagnosis: MDD (major depressive disorder), recurrent severe, without psychosis (HCC) [F33.2]    1. MDD (major depressive disorder), recurrent severe, without psychosis (HCC)       Donia Guiles, LCSW 06/25/2016

## 2016-06-25 NOTE — Psych (Signed)
Behavioral Health Partial Program Assessment Note  Date: 06/25/2016 Name: Alessia Gonsalez MRN: [id number]  Chief Complaint: depression and anxiety  Subjective: Just out of inpatient after overdose   HPI: Ms Spear has been depressed and anxious off and on all her life.  Recently has been stressed out with family issues.  Her father in law died several years ago and her mother in law has depended on the patient's husband more.  Ms Berland perceives her as interfering with their child rearing and has a personality that clashes with her own.  Her 59 year old daughter has similar personality issues and the grandmother side against her.  Her husband has recently sided with his mother.  She is afraid the marriage is in trouble and she will be out and penniless.  She got overwhelmed and took an overdose.  All this brought up her childhood and her need to be on the side of right and fairness rather making peace.  She realizes she will have to make changes and is willing to do so. She did get help in the inpatient and does not want to lose the momentum and not end up suicidal again. Patient is a 51 y.o. Caucasian female presents with depression.  Patient was enrolled in partial psychiatric program on 06/25/16.  Primary complaints include: agitation, anger outbursts, anxiety, anxiety attacks, chronic pain, depression worse, difficulty sleeping, feeling depressed, feeling suicidal, increased irritability, poor concentration, relationship difficulties and I wanted to die".  Onset of symptoms was gradual with gradually improving course since that time. Psychosocial Stressors include the following: family and marital.   I have reviewed the inpatient records and the history as presented by the patient  Complaints of Pain: total body aches which have not been diagnosed but lupus is being investigated Past Psychiatric History:  3 inpatient stays,  Has tried just about every medication.  Has been diagnosed with  bipolar but that was dropped over time  Currently in treatment with Dr Jennelle Human.  Substance Abuse History: none Use of Alcohol: denied Use of Caffeine: other no issue Use of over the counter: no issue  Past Surgical History:  Procedure Laterality Date  . NO PAST SURGERIES      Past Medical History:  Diagnosis Date  . Anxiety   . Asthma   . Depression   . Diabetes mellitus type 2 in obese (HCC) 06/11/2016  . Embolism - blood clot January 1997 & January 2017   Also had LLE DVT in 1997  . Hypertension   . Sleep apnea    Outpatient Encounter Prescriptions as of 06/25/2016  Medication Sig  . busPIRone (BUSPAR) 15 MG tablet Take 1 tablet (15 mg total) by mouth 2 (two) times daily. For anxiety  . DULoxetine (CYMBALTA) 30 MG capsule Take 1 capsule (30 mg total) by mouth daily. For depression  . gabapentin (NEURONTIN) 400 MG capsule Take 2 capsules (800 mg total) by mouth 3 (three) times daily. For agitation  . hydrOXYzine (ATARAX/VISTARIL) 25 MG tablet Take 1 tablet (25 mg total) by mouth every 6 (six) hours as needed for anxiety.  . metFORMIN (GLUCOPHAGE) 500 MG tablet Take 2 tablets (1000 mg) in the morning & 1 tablet (500 mg) at bedtime: For diabetes management  . rivaroxaban (XARELTO) 20 MG TABS tablet TAKE 1 TABLET DAILY WITH SUPPER: Blood thiner  . traZODone (DESYREL) 50 MG tablet Take 1 tablet (50 mg total) by mouth at bedtime as needed for sleep.   No facility-administered encounter medications on file  as of 06/25/2016.    Allergies  Allergen Reactions  . Tramadol Other (See Comments)    Tingling all over     Social History  Substance Use Topics  . Smoking status: Former Smoker    Packs/day: 1.00    Years: 20.00    Quit date: 03/12/2004  . Smokeless tobacco: Never Used  . Alcohol use 0.6 oz/week    1 Cans of beer per week     Comment: 1-2 times a week   Functioning Relationships: strained with spouse or significant others Education: College       Please specify degree:  bachelors Other Pertinent History: None Family History  Problem Relation Age of Onset  . Dementia Father   . Heart disease Father   . Clotting disorder Mother   . Arthritis Mother   . Clotting disorder Sister   . Clotting disorder Maternal Grandmother   . Clotting disorder Maternal Aunt   . Rheumatologic disease Neg Hx   . Hyperparathyroidism Neg Hx      Review of Systems Constitutional: negative Eyes: negative Ears, nose, mouth, throat, and face: negative Respiratory: negative Cardiovascular: negative Gastrointestinal: negative Genitourinary: negative Integument/breast: negative Hematologic/lymphatic: negative Musculoskeletal: total body pain in joints and muscles undiagnosed Neurological: negative Behavioral/Psych: depression and anxiety Endocrine: negative Allergic/Immunologic: negative  Objective:  There were no vitals filed for this visit.  Physical Exam: No exam performed today, no exam necessary.  Mental Status Exam: Appearance:  Well groomed Psychomotor::  Within Normal Limits Attention span and concentration: Normal Behavior: calm, cooperative and adequate rapport can be established Speech:  normal pitch Mood:  depressed and anxious Affect:  normal and mood-congruent Thought Process:  Coherent and Goal Directed Thought Content:  Logical Orientation:  person, place and time/date Cognition:  grossly intact Insight:  Intact Judgment:  Intact Estimate of Intelligence: Above average Fund of knowledge: Intact Memory: Recent and remote intact Abnormal movements: None Gait and station: Normal  Assessment:  Diagnosis: 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  Generalized anxiety disorder Indications for admission: still depressed with potential for return of suicidal ideation as stress at home persists  Plan: patient enrolled in Partial Hospitalization Program  Treatment options and alternatives reviewed with patient and patient  understands the above plan.   Comments: will return the gabapentin to 1200 mg daily which helped more she says.  Increase the Cymbalta to 60 mg daily .    Carolanne Grumbling, MD

## 2016-06-25 NOTE — Psych (Deleted)
Behavioral Health Partial Program Assessment Note  Date: 06/25/2016 Name: Deborah Soto MRN: [id number]  Chief Complaint: ***  Subjective:   HPI: Patient is a 51 y.o. {race/ethnicity:22866} female presents with ***.  Patient was enrolled in partial psychiatric program on 06/25/16.  Primary complaints include: { :22888}.  Onset of symptoms was {onset:22896} with {clinical course - history:22865} course since that time. Psychosocial Stressors include the following: { :22893}.   I have reviewed the following documentation dated ***: {doc:22884}  Complaints of Pain: {pain assessment:22895}ar Past Psychiatric History:  { :22886}  Currently in treatment with ***.  Substance Abuse History: {Recreational drugs:22897} Use of Alcohol: { :22889} Use of Caffeine: { :22892} Use of over the counter: ***  Past Surgical History:  Procedure Laterality Date  . NO PAST SURGERIES      Past Medical History:  Diagnosis Date  . Anxiety   . Asthma   . Depression   . Diabetes mellitus type 2 in obese (HCC) 06/11/2016  . Embolism - blood clot January 1997 & January 2017   Also had LLE DVT in 1997  . Hypertension   . Sleep apnea    Outpatient Encounter Prescriptions as of 06/25/2016  Medication Sig  . busPIRone (BUSPAR) 15 MG tablet Take 1 tablet (15 mg total) by mouth 2 (two) times daily. For anxiety  . DULoxetine (CYMBALTA) 30 MG capsule Take 1 capsule (30 mg total) by mouth daily. For depression  . gabapentin (NEURONTIN) 400 MG capsule Take 2 capsules (800 mg total) by mouth 3 (three) times daily. For agitation  . hydrOXYzine (ATARAX/VISTARIL) 25 MG tablet Take 1 tablet (25 mg total) by mouth every 6 (six) hours as needed for anxiety.  . metFORMIN (GLUCOPHAGE) 500 MG tablet Take 2 tablets (1000 mg) in the morning & 1 tablet (500 mg) at bedtime: For diabetes management  . rivaroxaban (XARELTO) 20 MG TABS tablet TAKE 1 TABLET DAILY WITH SUPPER: Blood thiner  . traZODone (DESYREL) 50 MG  tablet Take 1 tablet (50 mg total) by mouth at bedtime as needed for sleep.   No facility-administered encounter medications on file as of 06/25/2016.    Allergies  Allergen Reactions  . Tramadol Other (See Comments)    Tingling all over     Social History  Substance Use Topics  . Smoking status: Former Smoker    Packs/day: 1.00    Years: 20.00    Quit date: 03/12/2004  . Smokeless tobacco: Never Used  . Alcohol use 0.6 oz/week    1 Cans of beer per week     Comment: 1-2 times a week   Functioning Relationships: { :22891} Education: {Education:22867} Other Pertinent History: {historical info:22887} Family History  Problem Relation Age of Onset  . Dementia Father   . Heart disease Father   . Clotting disorder Mother   . Arthritis Mother   . Clotting disorder Sister   . Clotting disorder Maternal Grandmother   . Clotting disorder Maternal Aunt   . Rheumatologic disease Neg Hx   . Hyperparathyroidism Neg Hx      Review of Systems {Review Of Systems:22885}  Objective:  There were no vitals filed for this visit.  Physical Exam: {Exam, Complete:22871}  Mental Status Exam: Appearance:  {appearance:22767} Psychomotor::  {Exam; behavior child:22792} Attention span and concentration: {Att/con:22794} Behavior: {behavior:22795} Speech:  {Speech:22796} Mood:  {mood:22843} Affect:  {Desc; affect:22845::"normal"} Thought Process:  {Thought Process (PAA):22688} Thought Content:  {Thought Content:22690} Orientation:  {orientation:22848} Cognition:  {Psych cognition:22849} Insight:  {insight and judgement:22851} Judgment:  {  insight and judgement:22851} Estimate of Intelligence: {Choices:22852} Fund of knowledge: {Knowledge:22856} Memory: {Memory:22857} Abnormal movements: {Abnormal:22859} Gait and station: {Gait and Station:22860}  Assessment:  Diagnosis: No primary diagnosis found. No diagnosis found.  Indications for admission:  {Indications:22876}  Plan: {Plan:22883}  Treatment options and alternatives reviewed with patient and patient {Understands:22882}.   Comments: *** .    Carolanne Grumbling, MD

## 2016-06-25 NOTE — Telephone Encounter (Signed)
Tried to call again to schedule 1 month appointment--mail box is full---will schedule and mail information to patient--will ask for conformation call.

## 2016-06-26 ENCOUNTER — Encounter (HOSPITAL_COMMUNITY): Payer: Self-pay | Admitting: Licensed Clinical Social Worker

## 2016-06-26 ENCOUNTER — Other Ambulatory Visit (HOSPITAL_COMMUNITY): Payer: 59 | Admitting: Licensed Clinical Social Worker

## 2016-06-26 DIAGNOSIS — F332 Major depressive disorder, recurrent severe without psychotic features: Secondary | ICD-10-CM | POA: Diagnosis not present

## 2016-06-26 NOTE — Telephone Encounter (Signed)
Still unable to reach patient--voice mail box is full

## 2016-06-26 NOTE — Progress Notes (Deleted)
This note was written elsewhere

## 2016-06-27 ENCOUNTER — Other Ambulatory Visit (HOSPITAL_COMMUNITY): Payer: 59 | Admitting: Licensed Clinical Social Worker

## 2016-06-27 DIAGNOSIS — F332 Major depressive disorder, recurrent severe without psychotic features: Secondary | ICD-10-CM

## 2016-06-27 NOTE — Psych (Signed)
   [facility] BH PHP THERAPIST PROGRESS NOTE  Renaye Janicki [id number]  Session Time: 9 AM - 2PM  Participation Level: Active  Behavioral Response: CasualAlertDepressed  Type of Therapy: Group Therapy  Treatment Goals addressed: Coping  Interventions: CBT, DBT, Supportive and Reframing  Summary: Clinician facilitated check-in regarding current stressors and situation, and review of patient completed daily inventory. Clinician utilized active listening and empathetic response and validated patient emotions. Clinician facilitated discussion on topics relevant to patients. Clinician introduced topic of needs and led activity, Needs Assesment. Clinician assessed for immediate needs, medication compliance and efficacy, and safety concerns.    Suicidal/Homicidal: Nowithout intent/plan  Therapist Response: Siriyah Ambrosius is a 51 y.o. female who presents with depression and anxiety symptoms. Patient arrived within time allowed and reports she is feeling "low." Patient rates her mood at a 4 on a 1- 10 scale with 10 being great. Patient engaged in activity and discussion. Patient identified most categories on needs assessment as "needing improvement" and used language exhibiting low self esteem and feeling not worthy. Patient demonstrates some progress as evidenced by decreased mood, yet willingness to work to improve the mood. Patient denies SI/HI/self-harm thoughts   Plan: Patient will continue in PHP and medication management, while working towards decreasing depression symptoms, increasing self worth, and establishing coping skills.     Diagnosis: MDD (major depressive disorder), recurrent severe, without psychosis (HCC) [F33.2]    1. MDD (major depressive disorder), recurrent severe, without psychosis (HCC)       Donia Guiles, LCSW 06/27/2016

## 2016-06-27 NOTE — Psych (Signed)
   [facility] BH PHP THERAPIST PROGRESS NOTE  Deborah Soto [id number]  Session Time: 9 AM - 2PM  Participation Level: Active  Behavioral Response: CasualAlertDepressed  Type of Therapy: Group Therapy  Treatment Goals addressed: Coping  Interventions: CBT, DBT, Supportive and Reframing  Summary: Clinician facilitated check-in regarding current stressors and situation, and review of patient completed daily inventory. Clinician utilized active listening and empathetic response and validated patient emotions. Clinician facilitated discussion on topics relevant to patients. Group participated in spiritual care group led by the chaplain, and a group led by cln Forde Radon, on using movement to retrain the body's response to stress. Clinician assessed for immediate needs, medication compliance and efficacy, and safety concerns.    Suicidal/Homicidal: Nowithout intent/plan  Therapist Response: Deborah Soto is a 51 y.o. female who presents with depression and anxiety symptoms. Patient arrived within time allowed and reports she is feeling "low." Patient rates her mood at a 4 on a 1- 10 scale with 10 being great. Patient engaged in activity and discussion. Patient reports continued struggles with finding worth in herself and dealing with family conflict. Patient demonstrates some progress as evidenced by insight into her situation and making connections with and being encouraging to other group members. Patient denies SI/HI/self-harm thoughts   Plan: Patient will continue in PHP and medication management, while working towards decreasing depression symptoms, increasing self worth, and establishing coping skills.     Diagnosis: MDD (major depressive disorder), recurrent severe, without psychosis (HCC) [F33.2]    1. MDD (major depressive disorder), recurrent severe, without psychosis (HCC)       Donia Guiles, LCSW 06/27/2016

## 2016-06-27 NOTE — Progress Notes (Signed)
Pt attended group facilitated by Chaplain. LCSW, Boneta Lucks, present and co-facilitating.  Group focused on topic of self care.  Following introductions, group members were given time to examine quotes related to self-care posted around group room.  Members were asked to pick a quote that they resonated with and one that they found difficult.  Group facilitators guided conversation as group members shared quotes and related how these interfaced with their concept of self-care and barriers to self-care.  Group conversation moved toward messages that members had received about their deserving care, claiming need for care in the midst of these messages, and finding ways to re-learn messages.       Deborah Soto was present throughout group.  Active in group discussion, she engaged voluntarily and related to other group members.   Deborah Soto related that her experience of her parents at childhood set the precedent that her needs did not matter.  She recalls not being affirmed.  States she was waiting for "her time" where she could  have space to care for herself and soon after college was married and with children.  Experiences little space in her relationship with spouse and children where she can feel as though her needs matter.  Expressed to group that she "thought I was going to have my time."   Notes feeling guilty in taking time to care for herself, as she feels this takes away from what she "should" be doing as a mother.   Noted she and spouse have different parenting styles, and finds him more permissive with children, which does not help her in setting boundaries.    Deborah Soto found a quote related to welcoming oneself as a Manufacturing systems engineer in your own life... And stated she has a difficult time offering herself the same welcome and care she offers to others.   The quote she resonated with related to planting a garden in one's life starting with a single seed.  Deborah Soto noted that she had dreamed to study social work after  [employer] and is taking steps to make this dream a reality by applying to MSW programs.  She noted several obstacles, but was notably hopeful in saying "we will figure those out."  She stated "I feel like this is my seed that I have been holding on to for so long."    [facility] Burnis Kingfisher, MDiv  Office [phone] Page [phone]

## 2016-06-28 ENCOUNTER — Encounter (HOSPITAL_COMMUNITY): Payer: Self-pay

## 2016-06-28 ENCOUNTER — Other Ambulatory Visit (HOSPITAL_COMMUNITY): Payer: 59 | Admitting: Licensed Clinical Social Worker

## 2016-06-28 VITALS — BP 110/78 | HR 84 | Ht 68.0 in | Wt 247.0 lb

## 2016-06-28 DIAGNOSIS — F332 Major depressive disorder, recurrent severe without psychotic features: Secondary | ICD-10-CM

## 2016-06-28 NOTE — Progress Notes (Signed)
Met with patient attending PHP today who presented with appropriate affect, depressed mood and reported recent overdose of Klonopin and other medications which she was not sure how much or what all she had taken.  States was placed in medical ICU and then to [facility] for psychiatric stabilization.  Reports relationship and family difficulties as main triggers to worsening depressive symptoms to the point she decided to harm self.  States she often has thoughts to harm self and has for may years but reported recent attempt had come to a breaking point after a family dispute around Easter.  Patient explained frustration of difficulties communicating effectively with husband and does not like that he likes to "pretend everything is fine". Reports difficulty working through disputes when they do not agree or see things differently and that children and Mother-in-law adds to the disputes.  Patient admits to periodic thoughts of wanting to harm self, never others but says her 49 year old and 49 year old keep her motivated to remain safe.  Patient discussed she is often in pain and is hopeful Cymbalta with use of Gabapentn that she started in the previous week will help with this and depression.  Encouraged patient to keep taking as Cymbalta often takes 2-4 weeks to start getting full results.  Discussed family therapy for patient and her husband as something patient may want to consider upon discharge from [facility] and patient agreed with plan but worried about finding someone that takes [employer].  Patient to work with [facility] staff to review possible options.  Patient stable today with denial of any present thoughts of wanting to harm self or others.  Reports satisfied with current medication regimen for now and will try to use Hydroxyzine at night to help relax and sleep as reports vivid dreams with Trazodone.  Patient now using a C-PAP machine and reports sleep overall better since she started using this.  Patient  to contact this nurse as needed and returned to [facility] today without any other concerns noted.  Patient denies any danger to self at present.

## 2016-06-29 ENCOUNTER — Other Ambulatory Visit (HOSPITAL_COMMUNITY): Payer: 59 | Admitting: Licensed Clinical Social Worker

## 2016-06-29 DIAGNOSIS — F332 Major depressive disorder, recurrent severe without psychotic features: Secondary | ICD-10-CM | POA: Diagnosis not present

## 2016-06-29 NOTE — Psych (Signed)
   [facility] BH PHP THERAPIST PROGRESS NOTE  Deborah Soto [id number]  Session Time: 9 AM - 2PM  Participation Level: Active  Behavioral Response: CasualAlertDepressed  Type of Therapy: Group Therapy  Treatment Goals addressed: Coping  Interventions: CBT, DBT, Supportive and Reframing  Summary: Clinician facilitated check-in regarding current stressors and situation, and review of patient completed daily inventory. Clinician utilized active listening and empathetic response and validated patient emotions. Clinician facilitated discussion on topics relevant to patients. Clinician introduced topic of self esteem. Clinician assessed for immediate needs, medication compliance and efficacy, and safety concerns.    Suicidal/Homicidal: Yeswithout intent/plan  Therapist Response: Deborah Soto is a 51 y.o. female who presents with depression and anxiety symptoms. Patient arrived within time allowed and reports she is feeling "low." Patient rates her mood at a 3 on a 1- 10 scale with 10 being great. Pt presented reporting SI since last session and was able to state what stopped her and safety plan. Patient engaged in activity and discussion. Patient shares this topic is difficult for her because she feels "worthless." Patient demonstrates limited progress as evidenced by exhibiting resistance in group about making changes and lack of willingness to explore alternatives. Patient denies SI/HI/self-harm thoughts at end of session.   Plan: Patient will continue in PHP and medication management, while working towards decreasing depression symptoms, increasing self worth, and establishing coping skills.     Diagnosis: MDD (major depressive disorder), recurrent severe, without psychosis (HCC) [F33.2]    1. MDD (major depressive disorder), recurrent severe, without psychosis (HCC)       Donia Guiles, LCSW 06/29/2016

## 2016-06-29 NOTE — Psych (Signed)
   [facility] BH PHP THERAPIST PROGRESS NOTE  Deborah Soto [id number]  Session Time: 9 AM - 2PM  Participation Level: Active  Behavioral Response: CasualAlertDepressed  Type of Therapy: Group Therapy  Treatment Goals addressed: Coping; cognitive strategies  Interventions: CBT, DBT, Supportive and Reframing  Summary: Clinician facilitated check-in regarding current stressors and situation, and review of patient completed daily inventory. Clinician utilized active listening and empathetic response and validated patient emotions. Clinician facilitated discussion on topics relevant to patients. Clinician introduced topic of cognitive distortions. Clinician assessed for immediate needs, medication compliance and efficacy, and safety concerns.    Suicidal/Homicidal: Nowithout intent/plan  Therapist Response: [name] is a 51 y.o. female who presents with depression and anxiety symptoms. Patient arrived within time allowed and reports she is feeling "low." Patient rates her mood at a 3 on a 1- 10 scale with 10 being great. Patient engaged in activity and discussion. Patient identified mind reading, and catastrophic thinking as the cognitive distortions she struggles with most.  Patient demonstrates some progress as evidenced by coming in very anxious and being able to de-escalate herself. Patient denies SI/HI/self-harm thoughts   Plan: Patient will continue in PHP and medication management, while working towards decreasing depression symptoms, increasing self worth, and establishing coping skills.     Diagnosis: MDD (major depressive disorder), recurrent severe, without psychosis (HCC) [F33.2]    1. MDD (major depressive disorder), recurrent severe, without psychosis (HCC)       Donia Guiles, LCSW 06/29/2016

## 2016-07-02 ENCOUNTER — Other Ambulatory Visit (HOSPITAL_COMMUNITY): Payer: 59 | Admitting: Licensed Clinical Social Worker

## 2016-07-02 DIAGNOSIS — F332 Major depressive disorder, recurrent severe without psychotic features: Secondary | ICD-10-CM | POA: Diagnosis not present

## 2016-07-03 ENCOUNTER — Other Ambulatory Visit (HOSPITAL_COMMUNITY): Payer: 59 | Admitting: Licensed Clinical Social Worker

## 2016-07-03 DIAGNOSIS — F332 Major depressive disorder, recurrent severe without psychotic features: Secondary | ICD-10-CM

## 2016-07-03 DIAGNOSIS — F339 Major depressive disorder, recurrent, unspecified: Secondary | ICD-10-CM

## 2016-07-03 NOTE — Psych (Signed)
   [facility] PHP THERAPIST PROGRESS NOTE  Deborah Soto [id number]  Session Time: 9AM - 2PM  Participation Level: Active  Behavioral Response: CasualAlertDepressed  Type of Therapy: Group Therapy  Treatment Goals addressed: Coping  Interventions: CBT, DBT, Supportive and Reframing  Summary: Clinician facilitated check-in regarding current stressors and situation, and review of patient completed daily inventory. Clinician utilized active listening and empathetic response and validated patient emotions. Clinician facilitated discussion on topics relevant to patients. Clinician introduced topic of distress tolerance skills. Clinician led activity on TIP and ACCEPTS skill. Clinician assessed for immediate needs, medication compliance and efficacy, and safety concerns.    Suicidal/Homicidal: Nowithout intent/plan  Therapist Response: [name] is a 51 y.o. female who presents with depression and anxiety symptoms. Patient arrived within time allowed and reports she is feeling "really anxious, my depression is ok." Patient rates her mood at a 5 on a 1- 10 scale with 10 being great. Pt denies SI however shares increased hopelessness and feeling as if nothing will change. Patient engaged in activity and discussion. Patient was able to identify ways to utilize the skills discussed. Patient demonstrates limited progress as evidenced by continued hopelessness and using language that she cannot control her situation. Patient denies SI/HI/self-harm thoughts at end of session.   Plan: Patient will continue in PHP and medication management, while working towards decreasing depression symptoms, increasing self worth, and establishing coping skills.     Diagnosis: MDD (major depressive disorder), recurrent severe, without psychosis (HCC) [F33.2]    1. MDD (major depressive disorder), recurrent severe, without psychosis (HCC)       Donia Guiles, LCSW 07/03/2016

## 2016-07-03 NOTE — Therapy (Addendum)
[facility] PARTIAL HOSPITALIZATION PROGRAM [address] [place], [place], [address] Phone: [phone]   Fax:  [phone]  Occupational Therapy Evaluation and Treatment  Patient Details  Name: Deborah Soto MRN: [id number] Date of Birth: Apr 21, 1965 Referring Provider: Dr. Nehemiah Massed  Encounter Date: 07/03/2016      OT End of Session - 07/03/16 1716    Visit Number 1   Number of Visits 6   Date for OT Re-Evaluation 07/27/16   Authorization Type [employer]   OT Start Time 1030   OT Stop Time 1130   OT Time Calculation (min) 60 min   Activity Tolerance Patient tolerated treatment well   Behavior During Therapy [facility] for tasks assessed/performed      Past Medical History:  Diagnosis Date  . Anxiety   . Asthma   . Depression   . Diabetes mellitus type 2 in obese (HCC) 06/11/2016  . Embolism - blood clot January 1997 & January 2017   Also had LLE DVT in 1997  . Hypertension   . Sleep apnea     Past Surgical History:  Procedure Laterality Date  . NO PAST SURGERIES      There were no vitals filed for this visit.      Subjective Assessment - 07/03/16 1716    Currently in Pain? No/denies           [facility] OT Assessment - 07/03/16 1715      Assessment   Diagnosis Major Depressive Disorder   Referring Provider Dr. Madaline Guthrie Cobos   Onset Date --  Chronic        OT assessment:  Diagnosis:  MDD Past medical history:  DM II, depression, sleep apnea, DVT, PE Living situation:  Lives with husband, 3 children, mother-in-law ADLs:  Independent Work:  Working Tax inspector Leisure: gardening, reading, Training and development officer, photography Social support: Family, unhappy with spousal support Struggles:  Depression, anxiety  OT goal:  Learning coping skills    General Causality Orientation Scale    Subscore Percentile Score  Autonomy 52 45.61  Control 48 42.35  Impersonal 44 36.40    Motivation Type   Motivation type Explanation    Impersonal/amotivational  There is a lack of connection between any of the individual's behavior and his or her personal goals. The individual is likely in a passivity state or manifests non-goal-directed behavior. This is considered a type of motivational deficit and warrants motivational interventions.        Assessment:  Patient demonstrates impersonal/amotivational behaviors. This is considered a type of motivational deficit and warrants motivational interventions. Patient will benefit from occupational therapy intervention in order to improve time management, financial management, stress management, job readiness skills, social skills, sleep hygiene, exercise and healthy eating habits, and health management skills and other psychosocial skills needed for preparation to return to full time community living and to be a productive community member.     Plan:  Patient will participate in skilled occupational therapy sessions individually or in a group setting to improve coping skills, psychosocial skills, and emotional skills required to return to prior level of function as a productive community member. Treatment will be 1-2 times per week for 2-6 weeks.     OT Group Session:  S:  "Depression and stress can be interconnected." O:  Patient actively participated in the following skilled occupational therapy treatment session this date.  Stress management-discussed what stress is and how it affects one's life. Deborah Soto  completed the How  Stressed am I? worksheet with a high stress level score.  Discussed her personal stress symptoms and her current coping mechanisms or ways of dealing with stress. Deborah Soto engaged in identifying causes of stress and identifying stress signals, which she identified for herself as fatigue, anxiety, sleep disturbances, and tension. Deborah Soto completed and discussed the Stress and Your Personality Type worksheet, identifying as type B  personality. She engaged in discussion of the effects of stress on health, both physical and mental health, including acute and chronic stress. Participated in brainstorming stress management techniques to employ including relaxation techniques, emotional regulation, exercise, music, and nutrition.  A:  Patient participated in skilled occupational therapy group for stress management skills this date.  Patient was engaged and open to strategies introduced.  P:  Continue participation in skilled occupational therapy groups  1-2 times per week for 2 weeks in order to gain the necessary skills needed to return to full time community living and learn effective coping strategies to be a productive community resident. Follow up on HEP for finding a stress management technique to begin incorporating into a daily routine.        OT Short Term Goals - 07/03/16 1717      OT SHORT TERM GOAL #1   Title Patient will be educated on strategies to improve psychosocial skills needed to participate fully in all daily, work, and leisure activities.   Time 3   Period Weeks   Status New     OT SHORT TERM GOAL #2   Title Patient will be educated on a HEP and independent with implementation of HEP.   Time 6   Period Weeks   Status New     OT SHORT TERM GOAL #3   Title Patient will independently apply psychosocial skills and coping mechanisms to her daily activities in order to function independently.   Time 6   Period Weeks   Status New                  Plan - 07/03/16 1716    Rehab Potential Good   OT Frequency 2x / week   OT Duration --  3 weeks   OT Treatment/Interventions Patient/family education  coping skills development, psychosocial skills development, community reintegration   Consulted and Agree with Plan of Care Patient      Patient will benefit from skilled therapeutic intervention in order to improve the following deficits and impairments:  Other (comment) (decreased coping  skills, decreased psychosocial skills )  Visit Diagnosis: Major depression, recurrent, chronic (HCC)    Problem List Patient Active Problem List   Diagnosis Date Noted  . Moderate obesity 06/22/2016  . Elevated blood pressure, situational 06/22/2016  . MDD (major depressive disorder), recurrent severe, without psychosis (HCC) 06/14/2016  . Intentional drug overdose (HCC)   . Acute encephalopathy 06/11/2016  . Diabetes mellitus type 2 in obese (HCC) 06/11/2016  . Suicidal ideation 06/11/2016  . Positive ANA (antinuclear antibody) 04/02/2016  . Hyperparathyroidism (HCC) 01/09/2016  . Snoring 09/20/2015  . Cervical disc disorder with radiculopathy of cervical region 05/12/2015  . Hepatic steatosis 04/18/2015  . Chest pain   . Dyspnea   . Achilles tendonitis 04/08/2012  . 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 PTE (pulmonary thromboembolism) (HCC) 10/10/2011  . Preeclampsia 10/10/2011  . Diverticulitis 10/10/2011  . Bipolar affective disorder (HCC) 10/10/2011  . BMI 34.0-34.9,adult 10/10/2011  . AR (allergic rhinitis) 10/10/2011   Ezra Sites, OTR/L  [phone] 07/03/2016, 5:18 PM  [facility] PARTIAL HOSPITALIZATION PROGRAM [address] [place], [place], [address] Phone: [phone]   Fax:  [phone]  Name: Deborah Soto MRN: [id number] Date of Birth: July 05, 1965

## 2016-07-04 ENCOUNTER — Other Ambulatory Visit (HOSPITAL_COMMUNITY): Payer: 59 | Admitting: Licensed Clinical Social Worker

## 2016-07-04 DIAGNOSIS — F332 Major depressive disorder, recurrent severe without psychotic features: Secondary | ICD-10-CM | POA: Diagnosis not present

## 2016-07-04 NOTE — Progress Notes (Signed)
Pt attended group facilitated by Chaplain. LCSW, present and co-facilitating.  ?  Spiritual care group focused on topic of "community," exploring group member's current experience of community, what they value and what they hope for in community. Group engaged in facilitated discussion and then chose pieces of art or pictures that represented their current experience of community and what they long for.  Group facilitation drew on Narrative and Adlerian frameworks as well as brief CBT.  Deborah Soto was present throughout group.  She was alert and oriented and engaged with facilitators and other group members voluntarily.  She identified a sense of community as someplace she belongs and has a purpose.  Through group, she connected that she has engaged in community through her role, and feels as though she does not experience acceptance that she longs for.  She related that her therapist had helped her see how she may only allow people to relate to her through a role, and how she may close opportunities for people to offer her acceptance.  Deborah Soto chose a a picture of a group of flowers to illustrate how she wanted community to feel - stating that she wants to be a part.  Related that she hopes to focus on ways that this is true for her and to be conscious of times she is focusing on feeling out grouped.    [facility] Chaplain Burnis Kingfisher, [place]  Page [phone] Office [phone]

## 2016-07-04 NOTE — Psych (Signed)
   [facility] PHP THERAPIST PROGRESS NOTE  Deborah Soto [id number]  Session Time: 9 AM - 2PM  Participation Level: Active  Behavioral Response: CasualAlertDepressed  Type of Therapy: Group Therapy  Treatment Goals addressed: Coping  Interventions: CBT, DBT, Supportive and Reframing  Summary: Clinician facilitated check-in regarding current stressors and situation, and review of patient completed daily inventory. Clinician utilized active listening and empathetic response and validated patient emotions. Clinician facilitated discussion on topics relevant to patients. Group participated in spiritual care group led by the chaplain, and a group led by cln Forde Radon, on using movement to retrain the body's response to stress. Clinician assessed for immediate needs, medication compliance and efficacy, and safety concerns.    Suicidal/Homicidal: Nowithout intent/plan  Therapist Response: Deborah Soto is a 51 y.o. female who presents with depression and anxiety symptoms. Patient arrived within time allowed and reports she is feeling "better than yesterday." Patient rates her mood at a 6 on a 1- 10 scale with 10 being great. Patient engaged in activity and discussion. Patient reports continued struggles to re-frame her way of thinking to a more positive point of view.  Patient demonstrates some progress as evidenced by insight into her situation and making connections with and being encouraging to other group members. Patient denies SI/HI/self-harm thoughts   Plan: Patient will continue in PHP and medication management, while working towards decreasing depression symptoms, increasing self worth, and establishing coping skills.     Diagnosis: MDD (major depressive disorder), recurrent severe, without psychosis (HCC) [F33.2]    1. MDD (major depressive disorder), recurrent severe, without psychosis (HCC)       [facility] OT GROUP THERAPY 07/04/2016

## 2016-07-04 NOTE — Psych (Signed)
   [facility] PHP THERAPIST PROGRESS NOTE  Deborah Soto [id number]  Session Time: 9AM - 2PM  Participation Level: Active  Behavioral Response: CasualAlertDepressed  Type of Therapy: Group Therapy  Treatment Goals addressed: Coping  Interventions: CBT, DBT, Supportive and Reframing  Summary: Clinician facilitated check-in regarding current stressors and situation, and review of patient completed daily inventory. Clinician utilized active listening and empathetic response and validated patient emotions. Clinician facilitated discussion on topics relevant to patients. Clinician continued topic of distress tolerance skills. Clinician led activity on self soothe. Clinician assessed for immediate needs, medication compliance and efficacy, and safety concerns.    Suicidal/Homicidal: Nowithout intent/plan  Therapist Response: Deborah Soto is a 51 y.o. female who presents with depression and anxiety symptoms. Patient arrived within time allowed and reports she is feeling "really hopeless." Patient rates her mood at a 4 on a 1- 10 scale with 10 being great. Pt denies SI however shares continued hopelessness and feeling powerless to change anything. Patient engaged in activity and discussion. Patient was able to identify ways to utilize the skills discussed. Patient demonstrates some progress as evidenced by improved mood in the second half of group and showing willingness to try new strategies. Patient denies SI/HI/self-harm thoughts at end of session.   Plan: Patient will continue in PHP and medication management, while working towards decreasing depression symptoms, increasing self worth, and establishing coping skills.     Diagnosis: Major depression, recurrent, chronic (HCC) [F33.9]    1. Major depression, recurrent, chronic (HCC)   2. MDD (major depressive disorder), recurrent severe, without psychosis (HCC)       Donia Guiles, LCSW 07/04/2016

## 2016-07-05 ENCOUNTER — Encounter: Payer: 59 | Admitting: Physical Medicine & Rehabilitation

## 2016-07-05 ENCOUNTER — Other Ambulatory Visit (HOSPITAL_COMMUNITY): Payer: 59 | Admitting: Licensed Clinical Social Worker

## 2016-07-05 DIAGNOSIS — F332 Major depressive disorder, recurrent severe without psychotic features: Secondary | ICD-10-CM | POA: Diagnosis not present

## 2016-07-05 NOTE — Psych (Signed)
   [facility] PHP THERAPIST PROGRESS NOTE  Deborah Soto [id number]  Session Time: 9 AM - 2PM  Participation Level: Active  Behavioral Response: CasualAlertEuthymic  Type of Therapy: Group Therapy  Treatment Goals addressed: Coping  Interventions: CBT, DBT, Supportive and Reframing  Summary: Clinician facilitated check-in regarding current stressors and situation, and review of patient completed daily inventory. Clinician utilized active listening and empathetic response and validated patient emotions. Clinician facilitated discussion on topics relevant to patients. Clinician introduced topics of boundaries and decision making. Group participated in a boundary workshop. Clinician assessed for immediate needs, medication compliance and efficacy, and safety concerns.    Suicidal/Homicidal: Nowithout intent/plan  Therapist Response: Deborah Soto is a 51 y.o. female who presents with depression and anxiety symptoms. Patient arrived within time allowed and reports she is feeling "pretty good, still anxious." Patient rates her mood at a 7 on a 1- 10 scale with 10 being great. Patient engaged in activity and discussion. Patient demonstrates some progress as evidenced by reporting decreased hopelessness, increased willingness to problem solve, and improved affect.  Patient denies SI/HI/self-harm thoughts   Plan: Patient will continue in PHP and medication management, while working towards decreasing depression symptoms, increasing self worth, and establishing coping skills.     Diagnosis: MDD (major depressive disorder), recurrent severe, without psychosis (HCC) [F33.2]    1. MDD (major depressive disorder), recurrent severe, without psychosis (HCC)       Donia Guiles, LCSW 07/05/2016

## 2016-07-06 ENCOUNTER — Other Ambulatory Visit (HOSPITAL_COMMUNITY): Payer: 59 | Admitting: Licensed Clinical Social Worker

## 2016-07-06 VITALS — BP 132/96 | HR 62 | Ht 68.0 in | Wt 251.0 lb

## 2016-07-06 DIAGNOSIS — F332 Major depressive disorder, recurrent severe without psychotic features: Secondary | ICD-10-CM | POA: Diagnosis not present

## 2016-07-06 DIAGNOSIS — F322 Major depressive disorder, single episode, severe without psychotic features: Secondary | ICD-10-CM

## 2016-07-06 NOTE — Psych (Signed)
  [facility] Partial Hospitalization Program Psych Discharge Summary  Deborah Soto [id number]  Admission date: 06/22/2016 Discharge date: 07/06/2016  Reason for admission: depression  Progress in Program Toward Treatment Goals: improvement but still daily suicidal thoughts.  She is able to do other things with those thoughts rather than just focus on them and then feel she has to act on them.  Making some plans regarding her relationship with her husband that could relieve some stress for her.  Progress (rationale): motivated to change so she has put into effect the thins she has been learning  Discharge Plan: referred to IOP as she is still having daily suicidal thoughts but will try 3 days weekly rather than 5 days weekly.  Still taking cymbalta 60 mg daily.  The 3600 mg of gabapentin have not made much difference in the pain or anxiety compared to 2400 mg daily so she will go back to 1200 mg bid.    Carolanne Grumbling, MD 07/06/2016

## 2016-07-06 NOTE — Progress Notes (Signed)
Met with patient today as she presented with appropriate affect, pleasant mood and reports plans to transition to Intensive Outpatient Program in the coming week.  Patient denied any current suicidal or homicidal ideations, with no intent or plan to harm self or others. Patient reported considering leaving her husband but no plans at this time and discussed marriage counselors per her request as an interest.  Patient states she thinks her husband would support this and plans to explore further.  Patient reports PHP has been effective to assist with coping skills, sleeping better with C-PAP machine and wants to live now.  States she has discussed recent status with marriage and her mental status with 77 year old son who she believes to be a big support and this has helped her with being more comfortable with any potential change that occurs with her marriage.  States she just wants her husband to see her side regarding how her mother-in-law treats her and affects their relationship but again plans to address these concerns with later use of a marriage counselor.  Patient stable at this time and reports while anxious some about transition to new group is looking forward to it as states "they laugh a lot" in IOP.  Patient to call if any problems and will let us know if any problems with group transition in the coming week.

## 2016-07-09 ENCOUNTER — Other Ambulatory Visit (HOSPITAL_COMMUNITY): Payer: 59

## 2016-07-09 ENCOUNTER — Other Ambulatory Visit (HOSPITAL_COMMUNITY): Payer: 59 | Admitting: Psychiatry

## 2016-07-09 ENCOUNTER — Encounter (HOSPITAL_COMMUNITY): Payer: Self-pay

## 2016-07-09 DIAGNOSIS — F322 Major depressive disorder, single episode, severe without psychotic features: Secondary | ICD-10-CM

## 2016-07-09 DIAGNOSIS — F332 Major depressive disorder, recurrent severe without psychotic features: Secondary | ICD-10-CM | POA: Diagnosis not present

## 2016-07-09 NOTE — Psych (Signed)
   [facility] BH PHP THERAPIST PROGRESS NOTE  Deborah Soto [id number]  Session Time: 9 AM - 2PM  Participation Level: Active  Behavioral Response: CasualAlertEuthymic  Type of Therapy: Group Therapy  Treatment Goals addressed: Coping  Interventions: CBT, DBT, Supportive and Reframing  Summary: Clinician facilitated check-in regarding current stressors and situation, and review of patient completed daily inventory. Clinician utilized active listening and empathetic response and validated patient emotions. Clinician facilitated discussion on topics relevant to patients. Clinician introduced topic of assertiveness and led role playing activity. Clinician assessed for immediate needs, medication compliance and efficacy, and safety concerns.    Suicidal/Homicidal: Yeswithout intent/plan  Therapist Response: Donita Newland is a 51 y.o. female who presents with depression and anxiety symptoms. Patient arrived within time allowed and reports she is feeling "having a lot of ups and downs." Patient rates her mood at a 4 on a 1- 10 scale with 10 being great. Patient reports she has shifted her thinking regarding a problem with her home life and this has upset her mood, but she thinks it is a positive step. Patient engaged in activity and discussion. Patient demonstrates some progress as evidenced by opening her mind to new perspectives and reporting optimism about her ability to manage problems as they arise.  Patient denies SI/HI/self-harm thoughts by end of group.   Plan: Patient will discharge from PHP due to meeting treatment goals of decreased depression symptoms, increased coping abilities and further stabilization. Pt will step down to IOP within this agency due to continued feelings of worthlessness and intermittent SI. Pt and psychiatrist agree with this discharge plan. Progress is marked by observation, pt self report, and scales. Pt participated well in group treatment and demonstrated  progress of brightened affect, openness to new possibilities, and average of more positive days than negative by discharge. Pt will begin [facility]-IOP on Monday 07/09/16. Pt denies SI/HI/self harm thoughts at time of discharge.    Diagnosis: Severe major depression, single episode, without psychotic features (HCC) [F32.2]    1. Severe major depression, single episode, without psychotic features (HCC)       Deborah Guiles, LCSW 07/09/2016

## 2016-07-10 ENCOUNTER — Other Ambulatory Visit (HOSPITAL_COMMUNITY): Payer: 59 | Attending: Psychiatry | Admitting: Psychiatry

## 2016-07-10 ENCOUNTER — Other Ambulatory Visit (HOSPITAL_COMMUNITY): Payer: 59

## 2016-07-10 DIAGNOSIS — F332 Major depressive disorder, recurrent severe without psychotic features: Secondary | ICD-10-CM | POA: Insufficient documentation

## 2016-07-10 NOTE — Progress Notes (Signed)
Deborah Soto transitioned from the Partial Hospital Program to IOP.  I followed her in th PHP as well.  She continues having daily suicidal thoughts and still struggles with her marriage and relationship with her mother in law.  She does not feel ready to return to just individual therapy.  She was admitted to IOP to continue working on strategies to deal with her family relationships and her suicidal thinking.  Diagnosis remains Major depression, severe, recurrent without psychosis  Admit to IOP with daily group therapy  Current meds will be continued as they seem appropriate.  She has situational issues to resolve which will helpn more than medications I believe.

## 2016-07-11 ENCOUNTER — Other Ambulatory Visit (HOSPITAL_COMMUNITY): Payer: 59

## 2016-07-11 ENCOUNTER — Encounter (HOSPITAL_COMMUNITY): Payer: Self-pay | Admitting: Occupational Therapy

## 2016-07-11 ENCOUNTER — Other Ambulatory Visit (HOSPITAL_COMMUNITY): Payer: 59 | Admitting: Psychiatry

## 2016-07-11 DIAGNOSIS — F332 Major depressive disorder, recurrent severe without psychotic features: Secondary | ICD-10-CM | POA: Diagnosis present

## 2016-07-11 DIAGNOSIS — F322 Major depressive disorder, single episode, severe without psychotic features: Secondary | ICD-10-CM

## 2016-07-11 NOTE — Therapy (Signed)
[facility] PARTIAL HOSPITALIZATION PROGRAM [address] [place], [place], [address] Phone: [phone]   Fax:  [phone]  Patient Details  Name: Deborah Soto MRN: [id number] Date of Birth: 03/04/1966 Referring Provider:  No ref. provider found  Encounter Date: 07/11/2016    OCCUPATIONAL THERAPY DISCHARGE SUMMARY  Visits from Start of Care: 1  Current functional level related to goals / functional outcomes: Pt actively participated in one stress management OT group session prior to discharge from PHP program.   Remaining deficits: Pt continues to have decreased coping skills limiting ability to appropriately function in the community.    Education / Equipment: Pt educated on stress management strategies and emotional regulation techniques.   Plan: Patient agrees to discharge.  Patient goals were not met. Patient is being discharged due to not returning since the last visit.  ?????        , OTR/L  [phone] 07/11/2016, 3:28 PM  [facility] PARTIAL HOSPITALIZATION PROGRAM [address] [place], [place], [address] Phone: [phone]   Fax:  [phone] 

## 2016-07-11 NOTE — Progress Notes (Signed)
    Daily Group Progress Note  Program: IOP  Group Time: 9:00-12:00  Participation Level: Active  Behavioral Response: Appropriate  Type of Therapy:  Group Therapy  Summary of Progress: Pt. Participated in medication management group with the pharmacist. Pt. Presents as talkative and engaged in the group process. Pt. Shared that she felt very sad and was not looking forward to her 80 anniversary tomorrow because of history of marital conflict. Pt. Shared that she would not be in group tomorrow because of her anniversary.     Shaune Pollack, LPC

## 2016-07-11 NOTE — Progress Notes (Signed)
Progress note  Ms Losano is having a hard day.  Her 25th anniversary did not go well.  She continues to expect others to change their behaviors because what she wants and needs seem so logical to her that it is hard to believe they do not do the things they do towards her on purpose.  The "they " are mostly her husband and mother in law.  Intellectually she knows the only person she can change is herself but her emotions take over and reason goes out the window.  Consequently she is suicidal more than usual but not to the point that she needs to be in the hospital she says.  Plan is to continue IOP 3 days per week.  She knows all the numbers to call if she needs help.  She has decisions to make regarding her marriage and none of them are without consequences.  She was reminded not to make major decisions while she is so confused.  she also has couples therapy arranged.

## 2016-07-12 ENCOUNTER — Other Ambulatory Visit (HOSPITAL_COMMUNITY): Payer: 59

## 2016-07-13 ENCOUNTER — Other Ambulatory Visit (HOSPITAL_COMMUNITY): Payer: 59

## 2016-07-13 ENCOUNTER — Other Ambulatory Visit (HOSPITAL_COMMUNITY): Payer: 59 | Admitting: Psychiatry

## 2016-07-13 DIAGNOSIS — F322 Major depressive disorder, single episode, severe without psychotic features: Secondary | ICD-10-CM

## 2016-07-13 DIAGNOSIS — F332 Major depressive disorder, recurrent severe without psychotic features: Secondary | ICD-10-CM | POA: Diagnosis not present

## 2016-07-13 NOTE — Progress Notes (Signed)
    Daily Group Progress Note  Program: IOP  Group Time: 9:00-12:00  Participation Level: Active  Behavioral Response: Appropriate  Type of Therapy:  Group Therapy  Summary of Progress: Pt. Presented as tearful, sad. Pt. Discussed her anniversary of marriage that was yesterday. Pt. Discussed that she felt invalidated, not made a priority, by her husband's behavior. Pt. Stated that statements that he made were very hurtful to her. Pt. Received feedback from the group about being more assertive in her marriage and learning to state her needs and also the importance of accepting her husband for who he is. Pt. Is very unhappy in her marriage, but fearful of the unknown of leaving the marriage.      Shaune PollackBrown, Deborah Soto, LPC

## 2016-07-16 ENCOUNTER — Other Ambulatory Visit (HOSPITAL_COMMUNITY): Payer: 59 | Admitting: Psychiatry

## 2016-07-16 DIAGNOSIS — F322 Major depressive disorder, single episode, severe without psychotic features: Secondary | ICD-10-CM

## 2016-07-16 DIAGNOSIS — F332 Major depressive disorder, recurrent severe without psychotic features: Secondary | ICD-10-CM | POA: Diagnosis not present

## 2016-07-16 NOTE — Progress Notes (Signed)
    Daily Group Progress Note  Program: IOP  Group Time:  9:00-12:00  Participation Level: Active  Behavioral Response: Appropriate  Type of Therapy:  Group Therapy  Summary of Progress: Pt. Presented as tearful, depressed. Pt. Discussed ongoing problems in her marriage and acknowledged that this has contributed to her depression. Pt. Participated in discussion about prioritizing self-care.    Shaune PollackBrown, Ha Placeres B, LPC

## 2016-07-17 ENCOUNTER — Other Ambulatory Visit (HOSPITAL_COMMUNITY): Payer: 59

## 2016-07-18 ENCOUNTER — Other Ambulatory Visit (HOSPITAL_COMMUNITY): Payer: 59 | Admitting: Psychiatry

## 2016-07-18 DIAGNOSIS — F332 Major depressive disorder, recurrent severe without psychotic features: Secondary | ICD-10-CM | POA: Diagnosis not present

## 2016-07-18 DIAGNOSIS — F322 Major depressive disorder, single episode, severe without psychotic features: Secondary | ICD-10-CM

## 2016-07-18 NOTE — Progress Notes (Signed)
    Daily Group Progress Note  Program: IOP  Group Time: 9:00-12:00  Participation Level: Active  Behavioral Response: Appropriate  Type of Therapy:  Group Therapy  Summary of Progress: Pt. Presents as sad. Pt. Reports that her body hurts and that she has suffered with chronic body pain for several years. Counselor worked with Pt. To develop awareness of the connection between her emotional pain and her physical pain. Pt. Shared that she had a good weekend with her youngest child watching her play baseball and that her pain was manageable while watching her play.      Shaune PollackBrown, Shavona Gunderman B, LPC

## 2016-07-19 ENCOUNTER — Other Ambulatory Visit (HOSPITAL_COMMUNITY): Payer: 59

## 2016-07-20 ENCOUNTER — Other Ambulatory Visit (HOSPITAL_COMMUNITY): Payer: 59 | Admitting: Psychiatry

## 2016-07-20 DIAGNOSIS — F322 Major depressive disorder, single episode, severe without psychotic features: Secondary | ICD-10-CM

## 2016-07-20 DIAGNOSIS — F332 Major depressive disorder, recurrent severe without psychotic features: Secondary | ICD-10-CM | POA: Diagnosis not present

## 2016-07-23 ENCOUNTER — Other Ambulatory Visit (HOSPITAL_COMMUNITY): Payer: 59

## 2016-07-23 NOTE — Progress Notes (Signed)
    Daily Group Progress Note  Program: IOP  Group Time: 9:00-12:00  Participation Level: Active  Behavioral Response: Appropriate  Type of Therapy:  Group Therapy  Summary of Progress: Pt. Presents as talkative, with mild anxiety and depression. Pt. Discussed ongoing challenge of unhappy marriage and fears about separation. Pt. Discussed her desire to pursue professional goals and decisions about programs and financing a graduate degree. Pt. Discussed that she had a couples session and she was disappointed with the outcome because the therapist was encouraging her to have empathy for her husband's feelings after her suicide attempt.     Shaune PollackBrown, Laporsha Grealish B, LPC

## 2016-07-23 NOTE — Progress Notes (Signed)
    Daily Group Progress Note  Program: IOP  Group Time: 9:00-12:00  Participation Level: Active  Behavioral Response: Appropriate  Type of Therapy:  Group Therapy  Summary of Progress: Pt. Presents as angry and sad. Pt. Understands the trigger for her emotions which is problems in her marriage. Pt. Processed problems with her husband and fears of confrontation with him related to having her needs met in the marriage. Pt. Was receptive of feedback from the group about sitting with the discomfort and fears that she associates with confronting her husband.    Nancie Neas, LPC

## 2016-07-24 ENCOUNTER — Ambulatory Visit (INDEPENDENT_AMBULATORY_CARE_PROVIDER_SITE_OTHER): Payer: 59 | Admitting: Physician Assistant

## 2016-07-24 ENCOUNTER — Ambulatory Visit (INDEPENDENT_AMBULATORY_CARE_PROVIDER_SITE_OTHER): Payer: 59 | Admitting: Internal Medicine

## 2016-07-24 ENCOUNTER — Encounter: Payer: Self-pay | Admitting: Physician Assistant

## 2016-07-24 ENCOUNTER — Encounter: Payer: Self-pay | Admitting: Internal Medicine

## 2016-07-24 VITALS — BP 130/86 | HR 95 | Temp 98.8°F | Resp 17 | Ht 68.0 in | Wt 248.0 lb

## 2016-07-24 VITALS — BP 110/88 | HR 92 | Ht 68.0 in | Wt 248.0 lb

## 2016-07-24 DIAGNOSIS — M545 Low back pain, unspecified: Secondary | ICD-10-CM

## 2016-07-24 DIAGNOSIS — R079 Chest pain, unspecified: Secondary | ICD-10-CM | POA: Diagnosis not present

## 2016-07-24 DIAGNOSIS — I2699 Other pulmonary embolism without acute cor pulmonale: Secondary | ICD-10-CM

## 2016-07-24 DIAGNOSIS — R319 Hematuria, unspecified: Secondary | ICD-10-CM | POA: Diagnosis not present

## 2016-07-24 DIAGNOSIS — R030 Elevated blood-pressure reading, without diagnosis of hypertension: Secondary | ICD-10-CM | POA: Diagnosis not present

## 2016-07-24 DIAGNOSIS — E668 Other obesity: Secondary | ICD-10-CM | POA: Diagnosis not present

## 2016-07-24 LAB — POCT URINALYSIS DIP (MANUAL ENTRY)
Bilirubin, UA: NEGATIVE
Glucose, UA: NEGATIVE mg/dL
Ketones, POC UA: NEGATIVE mg/dL
Nitrite, UA: NEGATIVE
Protein Ur, POC: NEGATIVE mg/dL
Spec Grav, UA: 1.02 (ref 1.010–1.025)
Urobilinogen, UA: 0.2 E.U./dL
pH, UA: 6 (ref 5.0–8.0)

## 2016-07-24 LAB — POC MICROSCOPIC URINALYSIS (UMFC): Mucus: ABSENT

## 2016-07-24 MED ORDER — HYDROCODONE-ACETAMINOPHEN 5-325 MG PO TABS: 1.0000 | ORAL_TABLET | Freq: Three times a day (TID) | ORAL | 0 refills | Status: DC | PRN

## 2016-07-24 NOTE — Progress Notes (Signed)
OFFICE NOTE  Chief Complaint:  Persistent chest pain, depression, suicidality  Primary Care Physician: Deborah Soto, Deborah Soto  HPI:  Deborah Soto is a 51 y.o. 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 Unfortunately she was recently hospitalized for a suicide attempt. She's been struggling a lot with depression and has been suffering chest discomfort. She feels heaviness in her chest which can come on in the central chest and does not necessarily associated with overt anxiety, but can last several hours. Is not worse with exertion or relieved by rest. Some of the pain is sharp and at other times feels like a heaviness. She recently been started on medication for neuropathic pain, Cymbalta and has noticed that her heart rate is been elevated. Today blood pressure was elevated as well as 138/110 initially but came down to 130/88. Her heart rate is in the high 90s. She previously saw my  partner Deborah Soto in 2017 for chest pain and underwent a nuclear stress test at that time which was negative. He also had an echocardiogram in January 2017 at the time with pulmonary embolus and showed a normal EF 60-65% without regional wall motion abnormalities.  07/24/2016  Deborah Soto returns today for follow-up. She reports consistent pain which is present most during the day and worse at night. She has pain all over her body including her knees and across her chest. This is been present for the past couple of years. She's been struggling with significant anxiety and depression as well as the suicidality history. She says that she does think about death every day. She says she also has a clear and "creative" plan to kill herself. I asked her if she is contracted for safety with her psychiatrist and she says that she does have a safety  plan and can contact him when she feels that she may harm or self. Recently she was started on lithium. She's also had an increase in her Cymbalta recently. She is on gabapentin. Her chest pain does not sound cardiac to me.  PMHx:  Past Medical History:  Diagnosis Date  . Anxiety   . Asthma   . Depression   . Diabetes mellitus type 2 in obese (HCC) 06/11/2016  . Embolism - blood clot January 1997 & January 2017   Also had LLE DVT in 1997  . Hypertension   . Sleep apnea     Past Surgical History:  Procedure Laterality Date  . NO PAST SURGERIES      FAMHx:  Family History  Problem Relation Age of Onset  . Dementia Father   . Heart disease Father   . Clotting disorder Mother   . Arthritis Mother   . Clotting disorder Sister   . Clotting disorder Maternal Grandmother   . Clotting disorder Maternal Aunt   . Rheumatologic disease Neg Hx   . Hyperparathyroidism Neg Hx     SOCHx:   reports that she quit smoking about 12 years ago. She has a 20.00 pack-year smoking history. She has never used smokeless tobacco. She reports that she drinks about 0.6 oz of alcohol per week . She reports that she does not use drugs.  ALLERGIES:  Allergies  Allergen Reactions  . Tramadol Other (See Comments)    Tingling all over     ROS: Pertinent items noted in HPI and remainder of  comprehensive ROS otherwise negative.  HOME MEDS: Current Outpatient Prescriptions on File Prior to Visit  Medication Sig Dispense Refill  . busPIRone (BUSPAR) 15 MG tablet Take 1 tablet (15 mg total) by mouth 2 (two) times daily. For anxiety 60 tablet 0  . DULoxetine (CYMBALTA) 60 MG capsule Take 1 capsule (60 mg total) by mouth daily. 30 capsule 2  . gabapentin (NEURONTIN) 400 MG capsule Take 2 capsules (800 mg total) by mouth 3 (three) times daily. For agitation 180 capsule 0  . hydrOXYzine (ATARAX/VISTARIL) 25 MG tablet Take 1 tablet (25 mg total) by mouth every 6 (six) hours as needed for anxiety. 60 tablet 0    . metFORMIN (GLUCOPHAGE) 500 MG tablet Take 2 tablets (1000 mg) in the morning & 1 tablet (500 mg) at bedtime: For diabetes management    . rivaroxaban (XARELTO) 20 MG TABS tablet TAKE 1 TABLET DAILY WITH SUPPER: Blood thiner 1 tablet 0  . traZODone (DESYREL) 50 MG tablet Take 1 tablet (50 mg total) by mouth at bedtime as needed for sleep. 30 tablet 0   No current facility-administered medications on file prior to visit.     LABS/IMAGING: No results found for this or any previous visit (from the past 48 hour(s)). No results found.  WEIGHTS: Wt Readings from Last 3 Encounters:  07/24/16 248 lb (112.5 kg)  06/22/16 248 lb (112.5 kg)  06/11/16 247 lb (112 kg)    VITALS: BP 110/88   Pulse 92   Ht 5\' 8"  (1.727 m)   Wt 248 lb (112.5 kg)   BMI 37.71 kg/m   EXAM: Deferred  EKG: Sinus rhythm at 92  ASSESSMENT: 1. Chest wall pain - neuropathic 2. Significant anxiety/depression/history of suicidality 3. Increased labile blood pressures-possibly related to anxiety 4. Moderate obesity 5. Family history of premature coronary disease 6. 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   [name] continues to have pain over her body as well as across her chest, in her knees and significant limitation to any type of activity. She is significantly depressed and his "daily" thoughts of suicidality with a "creative" plan. This is concerning but she says she is following closely with her psychiatrist and can contact him if she feels actively suicidal. I'm not recommending further cardiac testing at this time. It seems that she may have a chronic neuropathic pain disorder such as fibromyalgia in combination with depression.  Follow-up with me as needed.  Deborah Nose, MD, [facility] Attending Cardiologist [facility]  Deborah Soto 07/24/2016, 10:05 AM

## 2016-07-24 NOTE — Patient Instructions (Addendum)
  Please push hydration.  I am giving you norco for your symptoms.  Take as prescribed, as needed. I am also referring you back to your urologist.   I would like you to return to the clinic or the ED, if your symptoms dramatically worsen.  Kidney Stones Kidney stones (urolithiasis) are rock-like masses that form inside of the kidneys. Kidneys are organs that make pee (urine). A kidney stone can cause very bad pain and can block the flow of pee. The stone usually leaves your body (passes) through your pee. You may need to have a doctor take out the stone. Follow these instructions at home: Eating and drinking   Drink enough fluid to keep your pee clear or pale yellow. This will help you pass the stone.  If told by your doctor, change the foods you eat (your diet). This may include:  Limiting how much salt (sodium) you eat.  Eating more fruits and vegetables.  Limiting how much meat, poultry, fish, and eggs you eat.  Follow instructions from your doctor about eating or drinking restrictions. General instructions   Collect pee samples as told by your doctor. You may need to collect a pee sample:  24 hours after a stone comes out.  8-12 weeks after a stone comes out, and every 6-12 months after that.  Strain your pee every time you pee (urinate), for as long as told. Use the strainer that your doctor recommends.  Do not throw out the stone. Keep it so that it can be tested by your doctor.  Take over-the-counter and prescription medicines only as told by your doctor.  Keep all follow-up visits as told by your doctor. This is important. You may need follow-up tests. Preventing kidney stones  To prevent another kidney stone:  Drink enough fluid to keep your pee clear or pale yellow. This is the best way to prevent kidney stones.  Eat healthy foods.  Avoid certain foods as told by your doctor. You may be told to eat less protein.  Stay at a healthy weight. Contact a doctor  if:  You have pain that gets worse or does not get better with medicine. Get help right away if:  You have a fever or chills.  You get very bad pain.  You get new pain in your belly (abdomen).  You pass out (faint).  You cannot pee. This information is not intended to replace advice given to you by your health care provider. Make sure you discuss any questions you have with your health care provider. Document Released: 08/15/2007 Document Revised: 11/15/2015 Document Reviewed: 11/15/2015 Elsevier Interactive Patient Education  2017 [employer]    IF you received an x-ray today, you will receive an invoice from [facility]. Please contact [facility] at [phone] with questions or concerns regarding your invoice.   IF you received labwork today, you will receive an invoice from [facility]. Please contact [facility] at [phone] with questions or concerns regarding your invoice.   Our billing staff will not be able to assist you with questions regarding bills from these companies.  You will be contacted with the lab results as soon as they are available. The fastest way to get your results is to activate your My Chart account. Instructions are located on the last page of this paperwork. If you have not heard from us regarding the results in 2 weeks, please contact this office.

## 2016-07-24 NOTE — Patient Instructions (Signed)
Your physician recommends that you schedule a follow-up appointment as needed  

## 2016-07-24 NOTE — Progress Notes (Signed)
PRIMARY CARE AT [facility] [address], [place] [address] [phone]  Date:  07/24/2016   Name:  Deborah Soto   DOB:  01-07-1966   MRN:  [id number]  PCP:  Garnetta Buddy, PA    History of Present Illness:  Deborah Soto is a 51 y.o. female patient who presents to PCP with  Chief Complaint  Patient presents with  . Back Pain    mid back and right side onset 2 days     7/10--2 days ago with right sided pain.  The symptoms go down the back.  No urinary symptoms of dysuria, hematuria, or frequency.  No nausea or fever.   She denies any vaginal bleeding.  Normal bowel movements.   Symptoms generally wrap around the back chronically for the patient.  She has a recent hx of of intentional drug overdose about 1 month ago.  Hospitalization lasted 9 days.  She is currently in IOP 3x per week.  Started on Lithium by her psychiatrist.  cymbalta increased to 90mg .  She will see him in 1 month.  She has suicidal thoughts, but no escalation to execute plan.  Notes report that she has access to contact [facility] if this does.   Husband is helping to distribute medications, and she is unable to obtain these.  Patient Active Problem List   Diagnosis Date Noted  . Moderate obesity 06/22/2016  . Elevated blood pressure, situational 06/22/2016  . MDD (major depressive disorder), recurrent severe, without psychosis (HCC) 06/14/2016  . Intentional drug overdose (HCC)   . Acute encephalopathy 06/11/2016  . Diabetes mellitus type 2 in obese (HCC) 06/11/2016  . Suicidal ideation 06/11/2016  . Positive ANA (antinuclear antibody) 04/02/2016  . Hyperparathyroidism (HCC) 01/09/2016  . Snoring 09/20/2015  . Cervical disc disorder with radiculopathy of cervical region 05/12/2015  . Hepatic steatosis 04/18/2015  . Chest pain   . Dyspnea   . Achilles tendonitis 04/08/2012  . 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 PTE (pulmonary thromboembolism) (HCC) 10/10/2011  . Preeclampsia 10/10/2011  .  Diverticulitis 10/10/2011  . Bipolar affective disorder (HCC) 10/10/2011  . BMI 34.0-34.9,adult 10/10/2011  . AR (allergic rhinitis) 10/10/2011    Past Medical History:  Diagnosis Date  . Anxiety   . Asthma   . Depression   . Diabetes mellitus type 2 in obese (HCC) 06/11/2016  . Embolism - blood clot January 1997 & January 2017   Also had LLE DVT in 1997  . Hypertension   . Sleep apnea     Past Surgical History:  Procedure Laterality Date  . NO PAST SURGERIES      Social History  Substance Use Topics  . Smoking status: Former Smoker    Packs/day: 1.00    Years: 20.00    Quit date: 03/12/2004  . Smokeless tobacco: Never Used  . Alcohol use 0.6 oz/week    1 Cans of beer per week     Comment: 1-2 times a week    Family History  Problem Relation Age of Onset  . Dementia Father   . Heart disease Father   . Clotting disorder Mother   . Arthritis Mother   . Clotting disorder Sister   . Clotting disorder Maternal Grandmother   . Clotting disorder Maternal Aunt   . Rheumatologic disease Neg Hx   . Hyperparathyroidism Neg Hx     Allergies  Allergen Reactions  . Tramadol Other (See Comments)    Tingling all over     Medication list has  been reviewed and updated.  Current Outpatient Prescriptions on File Prior to Visit  Medication Sig Dispense Refill  . busPIRone (BUSPAR) 15 MG tablet Take 1 tablet (15 mg total) by mouth 2 (two) times daily. For anxiety 60 tablet 0  . DULoxetine (CYMBALTA) 60 MG capsule Take 1 capsule (60 mg total) by mouth daily. 30 capsule 2  . gabapentin (NEURONTIN) 400 MG capsule Take 2 capsules (800 mg total) by mouth 3 (three) times daily. For agitation 180 capsule 0  . hydrOXYzine (ATARAX/VISTARIL) 25 MG tablet Take 1 tablet (25 mg total) by mouth every 6 (six) hours as needed for anxiety. 60 tablet 0  . LITHIUM PO Take 1 tablet by mouth daily.    . metFORMIN (GLUCOPHAGE) 500 MG tablet Take 2 tablets (1000 mg) in the morning & 1 tablet (500 mg)  at bedtime: For diabetes management    . rivaroxaban (XARELTO) 20 MG TABS tablet TAKE 1 TABLET DAILY WITH SUPPER: Blood thiner 1 tablet 0  . traZODone (DESYREL) 50 MG tablet Take 1 tablet (50 mg total) by mouth at bedtime as needed for sleep. 30 tablet 0   No current facility-administered medications on file prior to visit.     ROS ROS otherwise unremarkable unless listed above.  Physical Examination: BP 130/86 (BP Location: Right Arm, Patient Position: Sitting, Cuff Size: Large)   Pulse 95   Temp 98.8 F (37.1 C) (Oral)   Resp 17   Ht 5\' 8"  (1.727 m)   Wt 248 lb (112.5 kg)   SpO2 97%   BMI 37.71 kg/m  Ideal Body Weight: Weight in (lb) to have BMI = 25: 164.1  Physical Exam  Constitutional: She is oriented to person, place, and time. She appears well-developed and well-nourished. No distress.  HENT:  Head: Normocephalic and atraumatic.  Right Ear: External ear normal.  Left Ear: External ear normal.  Eyes: Conjunctivae and EOM are normal. Pupils are equal, round, and reactive to light.  Cardiovascular: Normal rate and regular rhythm.  Exam reveals no gallop and no friction rub.   No murmur heard. Pulses:      Radial pulses are 2+ on the right side, and 2+ on the left side.       Dorsalis pedis pulses are 2+ on the right side, and 2+ on the left side.  Pulmonary/Chest: Effort normal. No respiratory distress.  Abdominal: Soft. Normal appearance and bowel sounds are normal. There is no CVA tenderness.  Neurological: She is alert and oriented to person, place, and time.  Skin: She is not diaphoretic.  Psychiatric: She has a normal mood and affect. Her behavior is normal.     Results for orders placed or performed in visit on 07/24/16  POCT Microscopic Urinalysis ([facility])  Result Value Ref Range   WBC,UR,HPF,POC None None WBC/hpf   RBC,UR,HPF,POC Many (A) None RBC/hpf   Bacteria None None, Too numerous to count   Mucus Absent Absent   Epithelial Cells, UR Per Microscopy Few  (A) None, Too numerous to count cells/hpf  POCT urinalysis dipstick  Result Value Ref Range   Color, UA yellow yellow   Clarity, UA clear clear   Glucose, UA negative negative mg/dL   Bilirubin, UA negative negative   Ketones, POC UA negative negative mg/dL   Spec Grav, UA 1.610 9.604 - 1.025   Blood, UA large (A) negative   pH, UA 6.0 5.0 - 8.0   Protein Ur, POC negative negative mg/dL   Urobilinogen, UA 0.2 0.2 or  1.0 E.U./dL   Nitrite, UA Negative Negative   Leukocytes, UA Trace (A) Negative     Assessment and Plan: Deborah Soto is a 51 y.o. female who is here today for right sided flank pain. Hematuria, consistent with kidney stones.   Obtaining CT scan, given short stint of norco at this time.   Referring back to her urologist for follow up. 1. Acute right-sided low back pain without sciatica - POCT Microscopic Urinalysis ([facility]) - POCT urinalysis dipstick   Trena PlattStephanie Heidee Audi, PA-C [facility] 07/24/2016 6:12 PM

## 2016-07-25 ENCOUNTER — Other Ambulatory Visit (HOSPITAL_COMMUNITY): Payer: 59 | Admitting: Psychiatry

## 2016-07-25 DIAGNOSIS — F322 Major depressive disorder, single episode, severe without psychotic features: Secondary | ICD-10-CM

## 2016-07-25 DIAGNOSIS — F332 Major depressive disorder, recurrent severe without psychotic features: Secondary | ICD-10-CM | POA: Diagnosis not present

## 2016-07-25 LAB — CBC
Hematocrit: 42.1 % (ref 34.0–46.6)
Hemoglobin: 14.9 g/dL (ref 11.1–15.9)
MCH: 31.2 pg (ref 26.6–33.0)
MCHC: 35.4 g/dL (ref 31.5–35.7)
MCV: 88 fL (ref 79–97)
Platelets: 295 10*3/uL (ref 150–379)
RBC: 4.78 x10E6/uL (ref 3.77–5.28)
RDW: 14 % (ref 12.3–15.4)
WBC: 8.6 10*3/uL (ref 3.4–10.8)

## 2016-07-25 LAB — BASIC METABOLIC PANEL
BUN/Creatinine Ratio: 25 — ABNORMAL HIGH (ref 9–23)
BUN: 16 mg/dL (ref 6–24)
CO2: 22 mmol/L (ref 18–29)
Calcium: 9.9 mg/dL (ref 8.7–10.2)
Chloride: 101 mmol/L (ref 96–106)
Creatinine, Ser: 0.63 mg/dL (ref 0.57–1.00)
GFR calc Af Amer: 123 mL/min/{1.73_m2} (ref >59)
GFR calc non Af Amer: 106 mL/min/{1.73_m2} (ref >59)
Glucose: 114 mg/dL — ABNORMAL HIGH (ref 65–99)
Potassium: 4.4 mmol/L (ref 3.5–5.2)
Sodium: 140 mmol/L (ref 134–144)

## 2016-07-26 ENCOUNTER — Other Ambulatory Visit (HOSPITAL_COMMUNITY): Payer: 59

## 2016-07-26 NOTE — Progress Notes (Signed)
    Daily Group Progress Note  Program: IOP  Group Time: 9:00-12:00  Participation Level: Active  Behavioral Response: Appropriate  Type of Therapy:  Group Therapy  Summary of Progress: Pt. Presents as talkative, engaged in the group process. Pt. Reports that she was overwhelmed by numerous medications and not feeling validated by her psychiatrist and primary care provider. Pt. Reports that she left provider on Monday with thoughts and plan to complete suicide, received a text from her employer and made decision to go to work. Pt. Participated in presentation and discussion about nutrition, including moderate exercise, and sleep hygiene in wellness plan by Laurey MoraleJamie Atras from the wellness department.      Shaune PollackBrown, Javiel Canepa B, LPC

## 2016-07-27 ENCOUNTER — Other Ambulatory Visit (HOSPITAL_COMMUNITY): Payer: 59 | Admitting: Licensed Clinical Social Worker

## 2016-07-27 ENCOUNTER — Ambulatory Visit
Admission: RE | Admit: 2016-07-27 | Discharge: 2016-07-27 | Disposition: A | Discharge status: Home / Self Care | Payer: 59 | Source: Ambulatory Visit | Attending: Physician Assistant | Admitting: Physician Assistant

## 2016-07-27 DIAGNOSIS — R319 Hematuria, unspecified: Secondary | ICD-10-CM

## 2016-07-27 DIAGNOSIS — F322 Major depressive disorder, single episode, severe without psychotic features: Secondary | ICD-10-CM

## 2016-07-27 DIAGNOSIS — M545 Low back pain, unspecified: Secondary | ICD-10-CM

## 2016-07-27 DIAGNOSIS — F332 Major depressive disorder, recurrent severe without psychotic features: Secondary | ICD-10-CM | POA: Diagnosis not present

## 2016-07-27 NOTE — Progress Notes (Signed)
    Daily Group Progress Note  Program: IOP  Group Time: 9:00-12:00  Participation Level: Active  Behavioral Response: Appropriate  Type of Therapy:  Group Therapy  Summary of Progress: Pt. Presents with depressed affect and stable mood. Pt shares continued interpersonal problems with her husband and having conflicts last night. Pt reports looking forward and feeling a renewed sense of purpose for her children. Pt engaged in group discussion. Pt denies SI/HI.     Donia GuilesJenny Javonte Elenes, LCSW

## 2016-07-28 ENCOUNTER — Telehealth: Payer: Self-pay

## 2016-07-28 NOTE — Telephone Encounter (Signed)
Pt advised of ct scan stable no obstructing stones per stephanie. She is doing ok I advised if any new pain or concerns see us or go to er [employer] [facility]

## 2016-07-30 ENCOUNTER — Other Ambulatory Visit (HOSPITAL_COMMUNITY): Payer: 59 | Admitting: Psychiatry

## 2016-07-30 DIAGNOSIS — F322 Major depressive disorder, single episode, severe without psychotic features: Secondary | ICD-10-CM

## 2016-07-30 DIAGNOSIS — F332 Major depressive disorder, recurrent severe without psychotic features: Secondary | ICD-10-CM | POA: Diagnosis not present

## 2016-07-31 ENCOUNTER — Other Ambulatory Visit (HOSPITAL_COMMUNITY): Payer: 59

## 2016-07-31 ENCOUNTER — Telehealth: Payer: Self-pay | Admitting: Physician Assistant

## 2016-07-31 NOTE — Telephone Encounter (Signed)
PATIENT SAW [name] ENGLISH ABOUT A WEEK AGO FOR SIDE PAIN. [name] REFERRED HER TO [facility]. SHE WAS NOT ABLE TO SEE DR. Ferd HibbsMCKINSEY (SHE WAS NOT SURE OF HIS SPELLING) BUT SHE SAW A NURSE PRACTITIONER. SHE TOLD HER THAT SHE PROBABLY NEEDS THE KIDNEY STONES REMOVED BUT THAT DR. Ferd HibbsMCKINSEY WOULD HAVE TO MAKE THAT DECISION. SHE WILL NOT BE ABLE TO SEE HIM UNTIL 09/03/16 AND THIS WOULD BE JUST A CONSULTATION. THEN IF HE DOES SAY THEY NEED TO BE REMOVED SHE WOULD HAVE TO WAIT ADDITIONAL TIME BECAUSE SHE IS ON A BLOOD THINNER. SHE JUST WANTS [name] TO REFER HER TO ANOTHER UROLOGIST BECAUSE THIS IS TOO LONG TO WAIT. BEST PHONE [phone] (CELL) MBC

## 2016-07-31 NOTE — Progress Notes (Signed)
    Daily Group Progress Note  Program: IOP  Group Time: 9:00-12:00  Participation Level: Active  Behavioral Response: Appropriate  Type of Therapy:  Group Therapy  Summary of Progress: Pt. Presented as talkative, energetic. Pt. Participated in medication management group facilitated by the pharmacist. Pt. Shared that she had another difficult conversation with her husband about finances. Pt. Discussed her decision to consult with an attorney. Counselor reflected that Pt. Was demonstrating some clarity and forward movement regarding decisions that she needs to make in her marriage.     Shaune PollackBrown, Aayansh Codispoti B, LPC

## 2016-07-31 NOTE — Telephone Encounter (Signed)
Can we do another referral ?

## 2016-08-01 ENCOUNTER — Other Ambulatory Visit (HOSPITAL_COMMUNITY): Payer: 59 | Admitting: Psychiatry

## 2016-08-01 DIAGNOSIS — F322 Major depressive disorder, single episode, severe without psychotic features: Secondary | ICD-10-CM

## 2016-08-01 DIAGNOSIS — F332 Major depressive disorder, recurrent severe without psychotic features: Secondary | ICD-10-CM | POA: Diagnosis not present

## 2016-08-01 NOTE — Progress Notes (Signed)
Patient ID: Marzetta BoardMichelle Soliday, female   DOB: 1965-09-07, 51 y.o.   MRN: [id number]

## 2016-08-01 NOTE — Patient Instructions (Signed)
D:  Pt successfully completed MH-IOP today.  A:  Discharge today.  Follow up with Dr. Jennelle Humanottle and Dr. Anson Frethris Spaulding.  Encouraged support groups.  R:  Pt receptive.

## 2016-08-01 NOTE — Progress Notes (Signed)
Deborah Soto is a 51 yo, married, Caucasian, female, who was refered by PHP. Pt was hospitalized at [facility] last month for a serious suicide attempt- overdosing on unknown amount of medication. Pt has a history of suicidal ideations, depression, anxiety and 3 inpatient hospitalizations. She needed continued help with integrating back into the home and resolving interpersonal conflict with her family that impacts her mood, self esteem and ability to cope with stressors.  Pt attended [facility] for ten days.  Pt continues to struggle with daily SI (denies a plan or intent).  C/O of a lot of physical pain d/t kidney stones.  Reports overall mood improving.  Saw a lawyer this week re: marriage.  Has upcoming appt with Dr. Carlus Pavlovennis McKnight in June.  States she also called Dr. Maryln ManuelJane Rosen-Grandon re: appt. A:  D/C today.  F/U with Drs. Cottle and Spaulding.  Encouraged support groups.  Discussed safety plans at length with pt.  R:  Pt receptive.      Chestine SporeLARK, RITA, M.Ed, CNA

## 2016-08-01 NOTE — Progress Notes (Signed)
Patient ID: Deborah Soto, female   DOB: Sep 18, 1965, 51 y.o.   MRN: [id number] [facility] DISCHARGE NOTE  Patient:  Deborah Soto DOB:  Sep 18, 1965  Date of Admission: 07/09/2016  Date of Discharge: 08/01/2016  Reason for Admission:depression  IOP Course:attended and participated.  Overall the program was helpful ,she says.  No great improvement in that the pain persists, the situational decisions with her husband remain unresolved and she continues feeling unsupported by husband and friends.  Nevertheless the support she felt from group was helpful she said  Mental Status at Discharge:not suicidal but the thoughts could return  Diagnosis: severe major depression, recurrent without psychosis  Level of Care:  IOP  Discharge destination:  Has appointments with her therapist and psychiatrist    Comments:  none  The patient received suicide prevention pamphlet:  Yes   Carolanne GrumblingGerald Abdalla Naramore, MD

## 2016-08-02 ENCOUNTER — Other Ambulatory Visit (HOSPITAL_COMMUNITY): Payer: 59

## 2016-08-02 ENCOUNTER — Other Ambulatory Visit (HOSPITAL_COMMUNITY): Payer: 59 | Admitting: Psychiatry

## 2016-08-02 NOTE — Telephone Encounter (Signed)
Spoke with pt and she has set up an appt [facility] on 6/5 and is on a wait list to potentially be seen sooner. Pt said she would rather not go back to [facility] due to past visit experience. She said she will need to [facility] for that [facility]. Thanks!

## 2016-08-02 NOTE — Telephone Encounter (Signed)
The issue is that [facility] is the only group in this area.  If she would like, we can refer her elsewhere, but this may take some time.  And likely they will consult but not recommend the surgery.   My opinion is that it may likely be best that she await this visit.  But I will refer elsewhere if necessary.

## 2016-08-02 NOTE — Telephone Encounter (Signed)
Can we see if there is anywhere sooner?

## 2016-08-02 NOTE — Progress Notes (Signed)
    Daily Group Progress Note  Program: IOP  Group Time: 9:00-12:00  Participation Level: Active  Behavioral Response: Appropriate  Type of Therapy:  Group Therapy  Summary of Progress: Pt. Prepared for discharge and met with the psychiatrist and case manager. Pt. Received feedback from the group regarding her participation in the group. Pt. Discussed progress that she has made seeing an attorney and getting clarity and confidence about moving forward in her marriage. Pt. Discussed relationships with her children, husband, and work and intention to continue with marriage and individual therapy. Pt. Indicated that she was not "cured" of her depression but was more clear of what she needed to do to better manage her depression.      Nancie Neas, LPC

## 2016-08-03 ENCOUNTER — Other Ambulatory Visit (HOSPITAL_COMMUNITY): Payer: 59

## 2016-08-03 ENCOUNTER — Other Ambulatory Visit (HOSPITAL_COMMUNITY): Payer: Self-pay

## 2016-08-06 ENCOUNTER — Other Ambulatory Visit (HOSPITAL_COMMUNITY): Payer: Self-pay

## 2016-08-07 ENCOUNTER — Other Ambulatory Visit (HOSPITAL_COMMUNITY): Payer: 59

## 2016-08-07 ENCOUNTER — Other Ambulatory Visit (HOSPITAL_COMMUNITY): Payer: Self-pay

## 2016-08-07 ENCOUNTER — Ambulatory Visit (HOSPITAL_COMMUNITY): Payer: Self-pay

## 2016-08-08 ENCOUNTER — Other Ambulatory Visit (HOSPITAL_COMMUNITY): Payer: Self-pay

## 2016-08-08 ENCOUNTER — Other Ambulatory Visit (HOSPITAL_COMMUNITY): Payer: 59

## 2016-08-09 ENCOUNTER — Ambulatory Visit (HOSPITAL_COMMUNITY): Payer: Self-pay

## 2016-08-09 ENCOUNTER — Other Ambulatory Visit (HOSPITAL_COMMUNITY): Payer: 59

## 2016-08-09 ENCOUNTER — Other Ambulatory Visit (HOSPITAL_COMMUNITY): Payer: Self-pay

## 2016-08-10 ENCOUNTER — Other Ambulatory Visit (HOSPITAL_COMMUNITY): Payer: 59

## 2016-08-10 ENCOUNTER — Other Ambulatory Visit (HOSPITAL_COMMUNITY): Payer: Self-pay

## 2016-08-13 ENCOUNTER — Other Ambulatory Visit (HOSPITAL_COMMUNITY): Payer: Self-pay

## 2016-08-13 ENCOUNTER — Other Ambulatory Visit (HOSPITAL_COMMUNITY): Payer: 59

## 2016-08-14 ENCOUNTER — Other Ambulatory Visit (HOSPITAL_COMMUNITY): Payer: 59

## 2016-08-14 ENCOUNTER — Ambulatory Visit (HOSPITAL_COMMUNITY): Payer: Self-pay

## 2016-08-14 ENCOUNTER — Other Ambulatory Visit (HOSPITAL_COMMUNITY): Payer: Self-pay

## 2016-08-15 ENCOUNTER — Other Ambulatory Visit (HOSPITAL_COMMUNITY): Payer: 59

## 2016-08-15 ENCOUNTER — Other Ambulatory Visit (HOSPITAL_COMMUNITY): Payer: Self-pay

## 2016-08-16 ENCOUNTER — Other Ambulatory Visit (HOSPITAL_COMMUNITY): Payer: 59

## 2016-08-16 ENCOUNTER — Other Ambulatory Visit (HOSPITAL_COMMUNITY): Payer: Self-pay

## 2016-08-16 ENCOUNTER — Ambulatory Visit (HOSPITAL_COMMUNITY): Payer: Self-pay

## 2016-08-17 ENCOUNTER — Other Ambulatory Visit (HOSPITAL_COMMUNITY): Payer: Self-pay

## 2016-08-17 ENCOUNTER — Other Ambulatory Visit (HOSPITAL_COMMUNITY): Payer: 59

## 2016-08-20 ENCOUNTER — Other Ambulatory Visit (HOSPITAL_COMMUNITY): Payer: Self-pay

## 2016-08-20 ENCOUNTER — Other Ambulatory Visit (HOSPITAL_COMMUNITY): Payer: 59

## 2016-08-21 ENCOUNTER — Ambulatory Visit (HOSPITAL_COMMUNITY): Payer: Self-pay

## 2016-08-21 ENCOUNTER — Other Ambulatory Visit (HOSPITAL_COMMUNITY): Payer: Self-pay

## 2016-08-21 ENCOUNTER — Other Ambulatory Visit (HOSPITAL_COMMUNITY): Payer: 59

## 2016-08-22 ENCOUNTER — Other Ambulatory Visit (HOSPITAL_COMMUNITY): Payer: Self-pay

## 2016-08-22 ENCOUNTER — Other Ambulatory Visit (HOSPITAL_COMMUNITY): Payer: 59

## 2016-08-23 ENCOUNTER — Other Ambulatory Visit (HOSPITAL_COMMUNITY): Payer: Self-pay

## 2016-08-23 ENCOUNTER — Other Ambulatory Visit (HOSPITAL_COMMUNITY): Payer: 59

## 2016-08-23 ENCOUNTER — Ambulatory Visit (HOSPITAL_COMMUNITY): Payer: Self-pay

## 2016-08-24 ENCOUNTER — Other Ambulatory Visit (HOSPITAL_COMMUNITY): Payer: Self-pay

## 2016-08-24 ENCOUNTER — Other Ambulatory Visit (HOSPITAL_COMMUNITY): Payer: 59

## 2016-08-27 ENCOUNTER — Other Ambulatory Visit (HOSPITAL_COMMUNITY): Payer: Self-pay

## 2016-08-27 ENCOUNTER — Other Ambulatory Visit (HOSPITAL_COMMUNITY): Payer: 59

## 2016-08-28 ENCOUNTER — Other Ambulatory Visit (HOSPITAL_COMMUNITY): Payer: 59

## 2016-08-28 ENCOUNTER — Ambulatory Visit (HOSPITAL_COMMUNITY): Payer: Self-pay

## 2016-08-28 ENCOUNTER — Other Ambulatory Visit (HOSPITAL_COMMUNITY): Payer: Self-pay

## 2016-08-29 ENCOUNTER — Other Ambulatory Visit (HOSPITAL_COMMUNITY): Payer: 59

## 2016-08-29 ENCOUNTER — Other Ambulatory Visit (HOSPITAL_COMMUNITY): Payer: Self-pay

## 2016-08-30 ENCOUNTER — Other Ambulatory Visit (HOSPITAL_COMMUNITY): Payer: 59

## 2016-08-30 ENCOUNTER — Ambulatory Visit (HOSPITAL_COMMUNITY): Payer: Self-pay

## 2016-08-30 ENCOUNTER — Other Ambulatory Visit: Payer: Self-pay | Admitting: Physician Assistant

## 2016-08-30 ENCOUNTER — Other Ambulatory Visit (HOSPITAL_COMMUNITY): Payer: Self-pay

## 2016-08-31 ENCOUNTER — Other Ambulatory Visit (HOSPITAL_COMMUNITY): Payer: Self-pay

## 2016-08-31 ENCOUNTER — Other Ambulatory Visit (HOSPITAL_COMMUNITY): Payer: 59

## 2016-08-31 ENCOUNTER — Other Ambulatory Visit: Payer: Self-pay | Admitting: Physician Assistant

## 2016-09-05 ENCOUNTER — Encounter: Payer: 59 | Admitting: Physical Medicine & Rehabilitation

## 2016-10-02 ENCOUNTER — Encounter (HOSPITAL_COMMUNITY): Payer: Self-pay | Admitting: Emergency Medicine

## 2016-10-02 ENCOUNTER — Emergency Department (HOSPITAL_COMMUNITY)
Admission: EM | Admit: 2016-10-02 | Discharge: 2016-10-02 | Disposition: A | Discharge status: Home / Self Care | Payer: 59 | Attending: Emergency Medicine | Admitting: Emergency Medicine

## 2016-10-02 DIAGNOSIS — Z7901 Long term (current) use of anticoagulants: Secondary | ICD-10-CM | POA: Insufficient documentation

## 2016-10-02 DIAGNOSIS — Z79899 Other long term (current) drug therapy: Secondary | ICD-10-CM | POA: Insufficient documentation

## 2016-10-02 DIAGNOSIS — Z87891 Personal history of nicotine dependence: Secondary | ICD-10-CM | POA: Insufficient documentation

## 2016-10-02 DIAGNOSIS — I10 Essential (primary) hypertension: Secondary | ICD-10-CM | POA: Diagnosis not present

## 2016-10-02 DIAGNOSIS — Z7984 Long term (current) use of oral hypoglycemic drugs: Secondary | ICD-10-CM | POA: Diagnosis not present

## 2016-10-02 DIAGNOSIS — J45909 Unspecified asthma, uncomplicated: Secondary | ICD-10-CM | POA: Diagnosis not present

## 2016-10-02 DIAGNOSIS — E213 Hyperparathyroidism, unspecified: Secondary | ICD-10-CM | POA: Insufficient documentation

## 2016-10-02 DIAGNOSIS — E119 Type 2 diabetes mellitus without complications: Secondary | ICD-10-CM | POA: Insufficient documentation

## 2016-10-02 DIAGNOSIS — R109 Unspecified abdominal pain: Secondary | ICD-10-CM | POA: Diagnosis not present

## 2016-10-02 LAB — CBC WITH DIFFERENTIAL/PLATELET
Basophils Absolute: 0 10*3/uL (ref 0.0–0.1)
Basophils Relative: 0 %
Eosinophils Absolute: 0.3 10*3/uL (ref 0.0–0.7)
Eosinophils Relative: 3 %
HCT: 40.2 % (ref 36.0–46.0)
Hemoglobin: 13.1 g/dL (ref 12.0–15.0)
Lymphocytes Relative: 22 %
Lymphs Abs: 2.6 10*3/uL (ref 0.7–4.0)
MCH: 29.7 pg (ref 26.0–34.0)
MCHC: 32.6 g/dL (ref 30.0–36.0)
MCV: 91.2 fL (ref 78.0–100.0)
Monocytes Absolute: 1.2 10*3/uL — ABNORMAL HIGH (ref 0.1–1.0)
Monocytes Relative: 10 %
Neutro Abs: 7.5 10*3/uL (ref 1.7–7.7)
Neutrophils Relative %: 65 %
Platelets: 311 10*3/uL (ref 150–400)
RBC: 4.41 MIL/uL (ref 3.87–5.11)
RDW: 13.5 % (ref 11.5–15.5)
WBC: 11.5 10*3/uL — ABNORMAL HIGH (ref 4.0–10.5)

## 2016-10-02 LAB — COMPREHENSIVE METABOLIC PANEL
ALT: 31 U/L (ref 14–54)
AST: 29 U/L (ref 15–41)
Albumin: 4 g/dL (ref 3.5–5.0)
Alkaline Phosphatase: 49 U/L (ref 38–126)
Anion gap: 7 (ref 5–15)
BUN: 10 mg/dL (ref 6–20)
CO2: 27 mmol/L (ref 22–32)
Calcium: 9.3 mg/dL (ref 8.9–10.3)
Chloride: 104 mmol/L (ref 101–111)
Creatinine, Ser: 0.88 mg/dL (ref 0.44–1.00)
GFR calc Af Amer: >60 mL/min (ref >60)
GFR calc non Af Amer: >60 mL/min (ref >60)
Glucose, Bld: 142 mg/dL — ABNORMAL HIGH (ref 65–99)
Potassium: 4.1 mmol/L (ref 3.5–5.1)
Sodium: 138 mmol/L (ref 135–145)
Total Bilirubin: 0.6 mg/dL (ref 0.3–1.2)
Total Protein: 6.1 g/dL — ABNORMAL LOW (ref 6.5–8.1)

## 2016-10-02 LAB — URINALYSIS, ROUTINE W REFLEX MICROSCOPIC
Bilirubin Urine: NEGATIVE
Glucose, UA: 50 mg/dL — AB
Ketones, ur: NEGATIVE mg/dL
Nitrite: NEGATIVE
Protein, ur: NEGATIVE mg/dL
Specific Gravity, Urine: 1.009 (ref 1.005–1.030)
pH: 6 (ref 5.0–8.0)

## 2016-10-02 LAB — LIPASE, BLOOD: Lipase: 28 U/L (ref 11–51)

## 2016-10-02 MED ORDER — ONDANSETRON 4 MG PO TBDP
4.0000 mg | ORAL_TABLET | Freq: Once | ORAL | Status: AC
  Administered 2016-10-02: 4 mg via ORAL
  Filled 2016-10-02: qty 1

## 2016-10-02 MED ORDER — HYDROCODONE-ACETAMINOPHEN 5-325 MG PO TABS
1.0000 | ORAL_TABLET | Freq: Once | ORAL | Status: AC
  Administered 2016-10-02: 1 via ORAL
  Filled 2016-10-02: qty 1

## 2016-10-02 MED ORDER — HYDROCODONE-ACETAMINOPHEN 5-325 MG PO TABS: ORAL_TABLET | ORAL | 0 refills | Status: DC

## 2016-10-02 NOTE — Discharge Instructions (Signed)
Please read and follow all provided instructions.  Your diagnoses today include:  1. Flank pain     Tests performed today include:  Vital signs. See below for your results today.   Medications prescribed:  Vicodin (hydrocodone/acetaminophen) - narcotic pain medication  DO NOT drive or perform any activities that require you to be awake and alert because this medicine can make you drowsy. BE VERY CAREFUL not to take multiple medicines containing Tylenol (also called acetaminophen). Doing so can lead to an overdose which can damage your liver and cause liver failure and possibly death.  Take any prescribed medications only as directed.  Home care instructions:  Follow any educational materials contained in this packet.  Follow-up instructions: Please follow-up with your urologist as planned for further evaluation of your symptoms.   Return instructions:   Please return to the Emergency Department if you experience worsening symptoms.   Return with uncontrolled pain, fever, uncontrolled vomiting.  Please return if you have any other emergent concerns.  Additional Information:  Your vital signs today were: BP (!) 133/100    Pulse (!) 102    Temp 98.1 F (36.7 C) (Oral)    Resp 18    Ht 5\' 8"  (1.727 m)    Wt 97.5 kg (215 lb)    SpO2 97%    BMI 32.69 kg/m  If your blood pressure (BP) was elevated above 135/85 this visit, please have this repeated by your doctor within one month. --------------

## 2016-10-02 NOTE — ED Triage Notes (Signed)
Patient arrives with complaint of left flank pain starting yesterday around 1300. Explains that she was traveling from [place] and the pain started when riding to airport. Took a hydrocodone at that time and it helped the pain some. Known kidney stones bilaterally. Now states pain has migrated laterally and to LLQ of abdomen.

## 2016-10-02 NOTE — ED Provider Notes (Signed)
[facility]-EMERGENCY DEPT Provider Note   CSN: [id number] Arrival date & time: 10/02/16  [id number]     History   Chief Complaint Chief Complaint  Patient presents with  . Flank Pain    HPI Deborah Soto is a 51 y.o. female.  Patient with known history of renal stones, depression and multiple suicide attempts, pulmonary embolism on anticoagulation -- presents with complaint of left flank pain starting yesterday while traveling home from [place]. Pain became severe but was tolerated after a dose of hydrocodone. Patient was feeling better last night but approximate 4 AM today, woke up with worsening pain prompting emergency department visit. No fevers, vomiting, diarrhea. Patient reports hematuria in the past several weeks which is now improved. No leg swelling or calf tenderness. Patient also reports some right-sided flank pain at times. She has a procedure scheduled in 3 days to treat intrarenal stones.        Past Medical History:  Diagnosis Date  . Anxiety   . Asthma   . Depression   . Diabetes mellitus type 2 in obese (HCC) 06/11/2016  . Embolism - blood clot January 1997 & January 2017   Also had LLE DVT in 1997  . Hypertension   . Sleep apnea     Patient Active Problem List   Diagnosis Date Noted  . Moderate obesity 06/22/2016  . Elevated blood pressure, situational 06/22/2016  . MDD (major depressive disorder), recurrent severe, without psychosis (HCC) 06/14/2016  . Intentional drug overdose (HCC)   . Acute encephalopathy 06/11/2016  . Diabetes mellitus type 2 in obese (HCC) 06/11/2016  . Suicidal ideation 06/11/2016  . Positive ANA (antinuclear antibody) 04/02/2016  . Hyperparathyroidism (HCC) 01/09/2016  . Snoring 09/20/2015  . Cervical disc disorder with radiculopathy of cervical region 05/12/2015  . Hepatic steatosis 04/18/2015  . Chest pain   . Dyspnea   . Achilles tendonitis 04/08/2012  . 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 PTE (pulmonary thromboembolism)  (HCC) 10/10/2011  . Preeclampsia 10/10/2011  . Diverticulitis 10/10/2011  . Bipolar affective disorder (HCC) 10/10/2011  . BMI 34.0-34.9,adult 10/10/2011  . AR (allergic rhinitis) 10/10/2011    Past Surgical History:  Procedure Laterality Date  . NO PAST SURGERIES      OB History    No data available       Home Medications    Prior to Admission medications   Medication Sig Start Date End Date Taking? Authorizing Provider  busPIRone (BUSPAR) 15 MG tablet Take 1 tablet (15 mg total) by mouth 2 (two) times daily. For anxiety 06/21/16   Armandina StammerNwoko, Agnes I, NP  DULoxetine (CYMBALTA) 60 MG capsule Take 1 capsule (60 mg total) by mouth daily. 06/25/16 06/25/17  Benjaman Pottaylor, Gerald D, MD  gabapentin (NEURONTIN) 400 MG capsule Take 2 capsules (800 mg total) by mouth 3 (three) times daily. For agitation 06/20/16   Armandina StammerNwoko, Agnes I, NP  HYDROcodone-acetaminophen (NORCO) 5-325 MG tablet Take 1 tablet by mouth every 8 (eight) hours as needed. 07/24/16   Trena PlattEnglish, [name] D, PA  hydrOXYzine (ATARAX/VISTARIL) 25 MG tablet Take 1 tablet (25 mg total) by mouth every 6 (six) hours as needed for anxiety. 06/20/16   Armandina StammerNwoko, Agnes I, NP  LITHIUM PO Take 1 tablet by mouth daily.    [provider]  metFORMIN (GLUCOPHAGE) 500 MG tablet Take 2 tablets (1000 mg) in the morning & 1 tablet (500 mg) at bedtime: For diabetes management 06/20/16   Sanjuana KavaNwoko, Agnes I, NP  [facility] DEL[facility] LANCETS 33G MISC Ck Blood sugar  up to 4 times a day as directed. ICD-10 E11.69, E66.9 09/03/16   Trena Platt D, PA  rivaroxaban (XARELTO) 20 MG TABS tablet TAKE 1 TABLET DAILY WITH SUPPER: Blood thiner 06/20/16   Armandina Stammer I, NP  traZODone (DESYREL) 50 MG tablet Take 1 tablet (50 mg total) by mouth at bedtime as needed for sleep. 06/20/16   Sanjuana Kava, NP    Family History Family History  Problem Relation Age of Onset  . Dementia Father   . Heart disease Father   . Clotting disorder Mother   . Arthritis Mother   . Clotting  disorder Sister   . Clotting disorder Maternal Grandmother   . Clotting disorder Maternal Aunt   . Rheumatologic disease Neg Hx   . Hyperparathyroidism Neg Hx     Social History Social History  Substance Use Topics  . Smoking status: Former Smoker    Packs/day: 1.00    Years: 20.00    Quit date: 03/12/2004  . Smokeless tobacco: Never Used  . Alcohol use 0.6 oz/week    1 Cans of beer per week     Comment: 1-2 times a week     Allergies   Tramadol   Review of Systems Review of Systems  Constitutional: Negative for fever.  HENT: Negative for rhinorrhea and sore throat.   Eyes: Negative for redness.  Respiratory: Negative for cough.   Cardiovascular: Negative for chest pain.  Gastrointestinal: Positive for nausea. Negative for abdominal pain, diarrhea and vomiting.  Genitourinary: Positive for flank pain and hematuria. Negative for dysuria.  Musculoskeletal: Negative for myalgias.  Skin: Negative for rash.  Neurological: Negative for headaches.     Physical Exam Updated Vital Signs BP (!) 137/99   Pulse 99   Temp 98.1 F (36.7 C) (Oral)   Resp 18   Ht 5\' 8"  (1.727 m)   Wt 97.5 kg (215 lb)   SpO2 95%   BMI 32.69 kg/m   Physical Exam  Constitutional: She appears well-developed and well-nourished.  HENT:  Head: Normocephalic and atraumatic.  Mouth/Throat: Oropharynx is clear and moist.  Eyes: Conjunctivae are normal. Right eye exhibits no discharge. Left eye exhibits no discharge.  Neck: Normal range of motion. Neck supple.  Cardiovascular: Normal rate, regular rhythm and normal heart sounds.   Pulmonary/Chest: Effort normal and breath sounds normal.  Abdominal: Soft. There is tenderness (Generalized tenderness crossed bilateral abdomen, no focality). There is no rebound and no guarding.  Neurological: She is alert.  Skin: Skin is warm and dry.  Psychiatric: She has a normal mood and affect.  Nursing note and vitals reviewed.    ED Treatments / Results    Labs (all labs ordered are listed, but only abnormal results are displayed) Labs Reviewed  URINALYSIS, ROUTINE W REFLEX MICROSCOPIC - Abnormal; Notable for the following:       Result Value   Color, Urine STRAW (*)    Glucose, UA 50 (*)    Hgb urine dipstick MODERATE (*)    Leukocytes, UA TRACE (*)    Bacteria, UA FEW (*)    Squamous Epithelial / LPF 0-5 (*)    All other components within normal limits  CBC WITH DIFFERENTIAL/PLATELET - Abnormal; Notable for the following:    WBC 11.5 (*)    Monocytes Absolute 1.2 (*)    All other components within normal limits  COMPREHENSIVE METABOLIC PANEL - Abnormal; Notable for the following:    Glucose, Bld 142 (*)    Total Protein  6.1 (*)    All other components within normal limits  LIPASE, BLOOD    Procedures Procedures (including critical care time)  Medications Ordered in ED Medications  HYDROcodone-acetaminophen (NORCO/VICODIN) 5-325 MG per tablet 1 tablet (1 tablet Oral Given 10/02/16 0700)  ondansetron (ZOFRAN-ODT) disintegrating tablet 4 mg (4 mg Oral Given 10/02/16 0700)     Initial Impression / Assessment and Plan / ED Course  I have reviewed the triage vital signs and the nursing notes.  Pertinent labs & imaging results that were available during my care of the patient were reviewed by me and considered in my medical decision making (see chart for details).     Patient seen and examined. Work-up initiated. Medications ordered. Pain is overall improving. Will treat symptoms and monitor. Would not CT at current time.   Vital signs reviewed and are as follows: BP (!) 137/99   Pulse 99   Temp 98.1 F (36.7 C) (Oral)   Resp 18   Ht 5\' 8"  (1.727 m)   Wt 97.5 kg (215 lb)   SpO2 95%   BMI 32.69 kg/m   Pain is not gone, but controlled, now more so on the right side. Remains non-focal and R side pain is more chronic.   Will d/c with #8 Vicodin to use as needed.   Encouraged return with worsening pain, fevers, new  symptoms or other concerns.  Patient counseled on use of narcotic pain medications. Counseled not to combine these medications with others containing tylenol. Urged not to drink alcohol, drive, or perform any other activities that requires focus while taking these medications. The patient verbalizes understanding and agrees with the plan.   Final Clinical Impressions(s) / ED Diagnoses   Final diagnoses:  Flank pain   Flank pain: Likely left-sided renal colic given patient's history. Labwork reassuring. No urinary tract infection. Pain improved upon arrival to the emergency department and is controlled while in the ED. No vomiting. No focal abdominal tenderness on exam. Do not feel that reimaging today is necessary given patient exam and lab findings. She will follow-up with her doctors and have procedure on Friday as planned. Return instructions as above.  New Prescriptions New Prescriptions   HYDROCODONE-ACETAMINOPHEN (NORCO/VICODIN) 5-325 MG TABLET    Take 1-2 tablets every 6 hours as needed for severe pain     Renne Crigler, PA-C 10/02/16 0981    Shaune Pollack, MD 10/02/16 [phone]

## 2016-10-02 NOTE — ED Notes (Signed)
ED Provider at bedside. 

## 2016-10-04 ENCOUNTER — Ambulatory Visit: Payer: 59 | Admitting: Physical Medicine & Rehabilitation

## 2016-10-11 ENCOUNTER — Encounter: Payer: Self-pay | Admitting: Physical Medicine & Rehabilitation

## 2016-10-11 ENCOUNTER — Telehealth: Payer: Self-pay

## 2016-10-11 ENCOUNTER — Encounter: Payer: 59 | Attending: Physical Medicine & Rehabilitation | Admitting: Physical Medicine & Rehabilitation

## 2016-10-11 VITALS — BP 127/88 | HR 112

## 2016-10-11 DIAGNOSIS — Z7901 Long term (current) use of anticoagulants: Secondary | ICD-10-CM | POA: Diagnosis not present

## 2016-10-11 DIAGNOSIS — M797 Fibromyalgia: Secondary | ICD-10-CM | POA: Insufficient documentation

## 2016-10-11 DIAGNOSIS — R079 Chest pain, unspecified: Secondary | ICD-10-CM | POA: Diagnosis not present

## 2016-10-11 DIAGNOSIS — M792 Neuralgia and neuritis, unspecified: Secondary | ICD-10-CM

## 2016-10-11 DIAGNOSIS — M5412 Radiculopathy, cervical region: Secondary | ICD-10-CM | POA: Insufficient documentation

## 2016-10-11 DIAGNOSIS — Z86718 Personal history of other venous thrombosis and embolism: Secondary | ICD-10-CM | POA: Diagnosis not present

## 2016-10-11 DIAGNOSIS — R000 Tachycardia, unspecified: Secondary | ICD-10-CM

## 2016-10-11 DIAGNOSIS — G4733 Obstructive sleep apnea (adult) (pediatric): Secondary | ICD-10-CM | POA: Insufficient documentation

## 2016-10-11 DIAGNOSIS — G894 Chronic pain syndrome: Secondary | ICD-10-CM | POA: Insufficient documentation

## 2016-10-11 DIAGNOSIS — F419 Anxiety disorder, unspecified: Secondary | ICD-10-CM | POA: Insufficient documentation

## 2016-10-11 DIAGNOSIS — J45909 Unspecified asthma, uncomplicated: Secondary | ICD-10-CM | POA: Insufficient documentation

## 2016-10-11 DIAGNOSIS — R269 Unspecified abnormalities of gait and mobility: Secondary | ICD-10-CM | POA: Diagnosis not present

## 2016-10-11 DIAGNOSIS — F329 Major depressive disorder, single episode, unspecified: Secondary | ICD-10-CM | POA: Insufficient documentation

## 2016-10-11 DIAGNOSIS — M791 Myalgia, unspecified site: Secondary | ICD-10-CM

## 2016-10-11 DIAGNOSIS — G479 Sleep disorder, unspecified: Secondary | ICD-10-CM

## 2016-10-11 DIAGNOSIS — D6859 Other primary thrombophilia: Secondary | ICD-10-CM | POA: Diagnosis not present

## 2016-10-11 DIAGNOSIS — I10 Essential (primary) hypertension: Secondary | ICD-10-CM | POA: Insufficient documentation

## 2016-10-11 DIAGNOSIS — Z87891 Personal history of nicotine dependence: Secondary | ICD-10-CM | POA: Insufficient documentation

## 2016-10-11 DIAGNOSIS — Z87442 Personal history of urinary calculi: Secondary | ICD-10-CM | POA: Diagnosis not present

## 2016-10-11 DIAGNOSIS — E119 Type 2 diabetes mellitus without complications: Secondary | ICD-10-CM | POA: Insufficient documentation

## 2016-10-11 MED ORDER — GABAPENTIN 800 MG PO TABS: 1200.0000 mg | ORAL_TABLET | Freq: Three times a day (TID) | ORAL | 1 refills | Status: DC

## 2016-10-11 NOTE — Telephone Encounter (Signed)
That is a better question for Misty StanleyLisa or someone in the front.  I did not mention a price, only that accupunture is an option we can explore.  Thanks.

## 2016-10-11 NOTE — Progress Notes (Signed)
Subjective:    Patient ID: Deborah Soto, female    DOB: 02-Dec-1965, 51 y.o.   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  Initially stated: She states it hurts all over, repeatedly says it is her "entire body".  Started ~2017.  Denies inciting event.  Progressively getting worse.  Denies alleviating factors.  Heat exacerbates the pain as well as sit/stand.  Constant.  Pt tried tramadol with ?side effects. Has associated numbness in her RUE.  Pain limits her from doing everything. Denies fall. Pt had thigh ultrasound which was negative for DVT.  For her cervical radiculopathy she is get ESIs in her neck with Ortho.  She also saw a Rheumatology and diagnosed with fibromyaglia.  She is [facility] as well, she is awaiting an appointment.  She has had issues with blood pressure and tachycardia.   Last clinic visit 05/24/16. Since last visit, she went to the ED for kidney stones, which she had removed on Friday.  The pain has improved, but persists.  She no longer sees the Chiropractor due to lack of benefits. She had a cervical ESI recently.  She has not been to pool therapy. She is not sure if the lidoderm patches helped.  She notes improvement with TENS unit initially, but no longer notices benefit.  The increase in Gabapentin was decreased again. She saw Rheum and was told non-Rheumatological. She was referred to Neurology.  She has an MRI scheduled for next week.  No benefit with Robaxin.  She has been wearing her CPAP. She has not attempted to lose weight because she states it is a low priority and she is trying to"just not die". She is no longer taking Fetzima, but HR remains elevated. She saw cardiology, who did not make any med changed. Denies falls. She notes being hospitalized for Suicide attempt since last visit.    Pain Inventory Average Pain 5 Pain  Right Now 5 My pain is dull, tingling and aching  In the last 24 hours, has pain interfered with the following? General activity 7 Relation with others 8 Enjoyment of life 10 What TIME of day is your pain at its worst? evening Sleep (in general) Poor  Pain is worse with: . Pain improves with: . Relief from Meds: 6  Mobility walk without assistance ability to climb steps?  yes do you drive?  yes  Function employed # of hrs/week 25  Neuro/Psych weakness numbness depression anxiety  Prior Studies Any changes since last visit?  no  Physicians involved in your care Any changes since last visit?  no   Family History  Problem Relation Age of Onset  . Dementia Father   . Heart disease Father   . Clotting disorder Mother   . Arthritis Mother   . Clotting disorder Sister   . Clotting disorder Maternal Grandmother   . Clotting disorder Maternal Aunt   . Rheumatologic disease Neg Hx   . Hyperparathyroidism Neg Hx    Social History   Social History  . Marital status: Married    Spouse name: N/A  . Number of children: Y  . Years of education: N/A   Occupational History  . admin assist    Social History Main Topics  . Smoking status: Former Smoker    Packs/day: 1.00    Years: 20.00    Quit date: 03/12/2004  .  Smokeless tobacco: Never Used  . Alcohol use 0.6 oz/week    1 Cans of beer per week     Comment: 1-2 times a week  . Drug use: No  . Sexual activity: Yes    Partners: Male    Birth control/ protection: None   Other Topics Concern  . None   Social History Narrative   Originally from [place]. Always lived in [place]. 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 No international travel recently. Previously has been to [place] in May 1996. No mold exposure recently. No bird exposure. Does have multiple pets.    Past Surgical History:  Procedure Laterality Date  . NO PAST SURGERIES     Past Medical History:  Diagnosis Date  . Anxiety   . Asthma   .  Depression   . Diabetes mellitus type 2 in obese (HCC) 06/11/2016  . Embolism - blood clot January 1997 & January 2017   Also had LLE DVT in 1997  . Hypertension   . Sleep apnea    BP 127/88   Pulse (!) 112   SpO2 97%   Opioid Risk Score:   Fall Risk Score:  `1  Depression screen PHQ 2/9  Depression screen [facility] 2/9 07/24/2016 05/30/2016 05/15/2016 04/03/2016 02/24/2016 02/01/2016 01/25/2016  Decreased Interest 0 0 3 3 0 0 0  Down, Depressed, Hopeless 0 1 3 3 1  0 1  PHQ - 2 Score 0 1 6 6 1  0 1  Altered sleeping - 3 3 - - - -  Tired, decreased energy - 3 3 - - - -  Change in appetite - 0 2 - - - -  Feeling bad or failure about yourself  - 1 3 - - - -  Trouble concentrating - 2 0 - - - -  Moving slowly or fidgety/restless - 0 0 - - - -  Suicidal thoughts - 0 0 - - - -  PHQ-9 Score - 10 17 - - - -  Difficult doing work/chores - - - - - - -  Some recent data might be hidden    Review of Systems  Constitutional: Positive for diaphoresis and unexpected weight change.  HENT: Negative.   Eyes: Negative.   Respiratory: Positive for shortness of breath.   Cardiovascular: Negative.   Gastrointestinal: Positive for abdominal pain.  Endocrine: Negative.   Genitourinary: Negative.   Musculoskeletal: Positive for arthralgias, back pain, myalgias and neck pain.  Skin: Negative.   Allergic/Immunologic: Negative.   Neurological: Positive for numbness.  Hematological: Negative.   Psychiatric/Behavioral: Negative.       Objective:   Physical Exam Gen: +Distressed. Vital signs reviewed HENT: Normocephalic, Atraumatic Eyes: EOMI. No discharge.  Cardio: Regular rhythm. +Tachycardia. No JVD. Pulm: B/l clear to auscultation.  Effort normal Abd: Soft, BS+ MSK:  Gait slightly antalgic.   Minimal TTP right gluteal muscles.    No edema.  Neuro:   Sensation intact to light touch in all LE dermatomes  Strength  4+/5 in all LE myotomes (limited due to pain)  SLR neg b/l  Skin: Warm and Dry.  Intact Psych: Flat affect. 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    1. Chronic pain syndrome with neuropathic pain  Xray knee reviewed, unremarkable. CT abd/pelvis reviewed showing multiple abnormalities.   Labs reviewed, brought in by patient  Notes reviewed since  last visit   Will not order NSAIDs due to Xarelto  Cold does not help, heat exacerbates symptoms, PT states PT aggravated symptoms, Chiropractor no longer provides benefit. Cymbalta, robaxin, ?Baclofen ineffective.  Encouraged pool therapy again   Cont to follow with Psychiatrist/Psychology  Cont Lidoderm patch  TENS has been effective for patient in the past, but no longer provides benefit  Will increase Gabapentin to 1200 TID  Will order Accupuncture   Do not believe Narcotics appropriate given history and working diagnosis  MRIs ordered by Neurology  Genetic workup by Rheum   2. Gait abnormality  Pt not interested in assistive device at this time  3. Sleep disturbance with OSA  Elavil 75mg  by Psych  Cont CPAP  4. Morbid Obesity  Pt saw dietitian and went to bariatric clinic and appetite stimulant  5. Myalgia   See #1  6. Fibromyalgia  Meds being adjusted by Psychiatry, but told by [facility] not fibro  Cont follow up by [facility]  Encouraged activity  7. Tachycardia with chest pain  Follow up with PCP/Psych  8. Hypercoagulable state  On Xarelto  9. Depression  With 3 hospitalizations, recently for suicidal ideation  Cont Psychiatry/Pschology

## 2016-10-11 NOTE — Telephone Encounter (Signed)
Patients husband called, during her visit acupuncture was mentioned along with a price, patient wanted to verify what was the price that was mentioned.

## 2016-10-15 ENCOUNTER — Ambulatory Visit (INDEPENDENT_AMBULATORY_CARE_PROVIDER_SITE_OTHER): Payer: 59 | Admitting: Physician Assistant

## 2016-10-15 ENCOUNTER — Encounter: Payer: Self-pay | Admitting: Physician Assistant

## 2016-10-15 VITALS — BP 127/87 | HR 83 | Temp 98.9°F | Resp 16 | Ht 68.0 in | Wt 246.0 lb

## 2016-10-15 DIAGNOSIS — R21 Rash and other nonspecific skin eruption: Secondary | ICD-10-CM | POA: Diagnosis not present

## 2016-10-15 LAB — POCT URINALYSIS DIP (MANUAL ENTRY)
Bilirubin, UA: NEGATIVE
Blood, UA: NEGATIVE
Glucose, UA: 250 mg/dL — AB
Ketones, POC UA: NEGATIVE mg/dL
Nitrite, UA: NEGATIVE
Protein Ur, POC: NEGATIVE mg/dL
Spec Grav, UA: 1.015 (ref 1.010–1.025)
Urobilinogen, UA: 0.2 E.U./dL
pH, UA: 5.5 (ref 5.0–8.0)

## 2016-10-15 LAB — POCT CBC
Granulocyte percent: 64.3 %G (ref 37–80)
HCT, POC: 42.8 % (ref 37.7–47.9)
Hemoglobin: 14.6 g/dL (ref 12.2–16.2)
Lymph, poc: 3.1 (ref 0.6–3.4)
MCH, POC: 30.4 pg (ref 27–31.2)
MCHC: 34.1 g/dL (ref 31.8–35.4)
MCV: 89.4 fL (ref 80–97)
MID (cbc): 1 — AB (ref 0–0.9)
MPV: 7.7 fL (ref 0–99.8)
POC Granulocyte: 7.5 — AB (ref 2–6.9)
POC LYMPH PERCENT: 26.7 %L (ref 10–50)
POC MID %: 9 %M (ref 0–12)
Platelet Count, POC: 340 10*3/uL (ref 142–424)
RBC: 4.79 M/uL (ref 4.04–5.48)
RDW, POC: 13.4 %
WBC: 11.6 10*3/uL — AB (ref 4.6–10.2)

## 2016-10-15 MED ORDER — HYDROXYZINE HCL 25 MG PO TABS: 12.5000 mg | ORAL_TABLET | Freq: Three times a day (TID) | ORAL | 0 refills | Status: DC | PRN

## 2016-10-15 MED ORDER — TRIAMCINOLONE ACETONIDE 0.1 % EX CREA
1.0000 | TOPICAL_CREAM | Freq: Two times a day (BID) | CUTANEOUS | 0 refills | Status: DC
Start: 2016-10-15 — End: 2016-12-12

## 2016-10-15 NOTE — Patient Instructions (Addendum)
I am giving you the steroid cream. You can also take the vistaril to treat for itching.    Rash A rash is a change in the color of the skin. A rash can also change the way your skin feels. There are many different conditions and factors that can cause a rash. Follow these instructions at home: Pay attention to any changes in your symptoms. Follow these instructions to help with your condition: Medicine Take or apply over-the-counter and prescription medicines only as told by your doctor. These may include:  Corticosteroid cream.  Anti-itch lotions.  Oral antihistamines.  Skin Care  Put cool compresses on the affected areas.  Try taking a bath with: ? Epsom salts. Follow the instructions on the packaging. You can get these at your local pharmacy or grocery store. ? Baking soda. Pour a small amount into the bath as told by your doctor. ? Colloidal oatmeal. Follow the instructions on the packaging. You can get this at your local pharmacy or grocery store.  Try putting baking soda paste onto your skin. Stir water into baking soda until it gets like a paste.  Do not scratch or rub your skin.  Avoid covering the rash. Make sure the rash is exposed to air as much as possible. General instructions  Avoid hot showers or baths, which can make itching worse. A cold shower may help.  Avoid scented soaps, detergents, and perfumes. Use gentle soaps, detergents, perfumes, and other cosmetic products.  Avoid anything that causes your rash. Keep a journal to help track what causes your rash. Write down: ? What you eat. ? What cosmetic products you use. ? What you drink. ? What you wear. This includes jewelry.  Keep all follow-up visits as told by your doctor. This is important. Contact a doctor if:  You sweat at night.  You lose weight.  You pee (urinate) more than normal.  You feel weak.  You throw up (vomit).  Your skin or the whites of your eyes look yellow  (jaundice).  Your skin: ? Tingles. ? Is numb.  Your rash: ? Does not go away after a few days. ? Gets worse.  You are: ? More thirsty than normal. ? More tired than normal.  You have: ? New symptoms. ? Pain in your belly (abdomen). ? A fever. ? Watery poop (diarrhea). Get help right away if:  Your rash covers all or most of your body. The rash may or may not be painful.  You have blisters that: ? Are on top of the rash. ? Grow larger. ? Grow together. ? Are painful. ? Are inside your nose or mouth.  You have a rash that: ? Looks like purple pinprick-sized spots all over your body. ? Has a "bull's eye" or looks like a target. ? Is red and painful, causes your skin to peel, and is not from being in the sun too long. This information is not intended to replace advice given to you by your health care provider. Make sure you discuss any questions you have with your health care provider. Document Released: 08/15/2007 Document Revised: 08/04/2015 Document Reviewed: 07/14/2014 Elsevier Interactive Patient Education  2018 [employer]     IF you received an x-ray today, you will receive an invoice from [facility]. Please contact [facility] at [phone] with questions or concerns regarding your invoice.   IF you received labwork today, you will receive an invoice from [facility]. Please contact [facility] at [phone] with questions or concerns regarding  your invoice.   Our billing staff will not be able to assist you with questions regarding bills from these companies.  You will be contacted with the lab results as soon as they are available. The fastest way to get your results is to activate your My Chart account. Instructions are located on the last page of this paperwork. If you have not heard from [name] regarding the results in 2 weeks, please contact this office.

## 2016-10-15 NOTE — Progress Notes (Signed)
PRIMARY CARE AT [facility] [address], [place] [address] [phone]  Date:  10/15/2016   Name:  Deborah Soto   DOB:  April 28, 1965   MRN:  [id number]  PCP:  Sigmund Hazel, MD    History of Present Illness:  Deborah Soto is a 51 y.o. female patient who presents to PCP with  Chief Complaint  Patient presents with  . Rash    arms, legs, back, and stomach/ x 2 days  . Back Pain    yesterday     Patient reports arm, leg, and back rash for 2 days. This initiated at the lower extremities, and has since spread to extremities, and back.  It is mildly pruritic, and has somewhat of a burning sensation when touched. No fever.  No UR symptoms of ear pain, sore throat, or coughing.  No new medications.  Gabapentin was increased from 2400-->3600 per day.  She has taken one dose 2 days ago prior to her symptoms starting.   No dizziness or chest pain.   No tick bites.  No outdoor activity in wooded areas.     Results for orders placed or performed in visit on 10/15/16  POCT urinalysis dipstick  Result Value Ref Range   Color, UA yellow yellow   Clarity, UA clear clear   Glucose, UA =250 (A) negative mg/dL   Bilirubin, UA negative negative   Ketones, POC UA negative negative mg/dL   Spec Grav, UA 5.784 6.962 - 1.025   Blood, UA negative negative   pH, UA 5.5 5.0 - 8.0   Protein Ur, POC negative negative mg/dL   Urobilinogen, UA 0.2 0.2 or 1.0 E.U./dL   Nitrite, UA Negative Negative   Leukocytes, UA Trace (A) Negative  POCT CBC  Result Value Ref Range   WBC 11.6 (A) 4.6 - 10.2 K/uL   Lymph, poc 3.1 0.6 - 3.4   POC LYMPH PERCENT 26.7 10 - 50 %L   MID (cbc) 1.0 (A) 0 - 0.9   POC MID % 9.0 0 - 12 %M   POC Granulocyte 7.5 (A) 2 - 6.9   Granulocyte percent 64.3 37 - 80 %G   RBC 4.79 4.04 - 5.48 M/uL   Hemoglobin 14.6 12.2 - 16.2 g/dL   HCT, POC 95.2 84.1 - 47.9 %   MCV 89.4 80 - 97 fL   MCH, POC 30.4 27 - 31.2 pg   MCHC 34.1 31.8 - 35.4 g/dL   RDW, POC 32.4 %   Platelet Count,  POC 340 142 - 424 K/uL   MPV 7.7 0 - 99.8 fL     Patient Active Problem List   Diagnosis Date Noted  . Moderate obesity 06/22/2016  . Elevated blood pressure, situational 06/22/2016  . MDD (major depressive disorder), recurrent severe, without psychosis (HCC) 06/14/2016  . Intentional drug overdose (HCC)   . Acute encephalopathy 06/11/2016  . Diabetes mellitus type 2 in obese (HCC) 06/11/2016  . Suicidal ideation 06/11/2016  . Positive ANA (antinuclear antibody) 04/02/2016  . Hyperparathyroidism (HCC) 01/09/2016  . Snoring 09/20/2015  . Cervical disc disorder with radiculopathy of cervical region 05/12/2015  . Hepatic steatosis 04/18/2015  . Chest pain   . Dyspnea   . Achilles tendonitis 04/08/2012  . 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 PTE (pulmonary thromboembolism) (HCC) 10/10/2011  . Preeclampsia 10/10/2011  . Diverticulitis 10/10/2011  . Bipolar affective disorder (HCC) 10/10/2011  . BMI 34.0-34.9,adult 10/10/2011  . AR (allergic rhinitis) 10/10/2011    Past Medical History:  Diagnosis Date  . Anxiety   . Asthma   . Depression   . Diabetes mellitus type 2 in obese (HCC) 06/11/2016  . Embolism - blood clot January 1997 & January 2017   Also had LLE DVT in 1997  . Hypertension   . Sleep apnea     Past Surgical History:  Procedure Laterality Date  . NO PAST SURGERIES      Social History  Substance Use Topics  . Smoking status: Former Smoker    Packs/day: 1.00    Years: 20.00    Quit date: 03/12/2004  . Smokeless tobacco: Never Used  . Alcohol use 0.6 oz/week    1 Cans of beer per week     Comment: 1-2 times a week    Family History  Problem Relation Age of Onset  . Dementia Father   . Heart disease Father   . Clotting disorder Mother   . Arthritis Mother   . Clotting disorder Sister   . Clotting disorder Maternal Grandmother   . Clotting disorder Maternal Aunt   . Rheumatologic disease Neg Hx   . Hyperparathyroidism Neg Hx     Allergies  Allergen  Reactions  . Tramadol Other (See Comments)    Tingling all over     Medication list has been reviewed and updated.  Current Outpatient Prescriptions on File Prior to Visit  Medication Sig Dispense Refill  . atorvastatin (LIPITOR) 10 MG tablet Take 10 mg by mouth daily.    . busPIRone (BUSPAR) 30 MG tablet Take 30 mg by mouth 2 (two) times daily.    Marland Kitchen. gabapentin (NEURONTIN) 800 MG tablet Take 1.5 tablets (1,200 mg total) by mouth 3 (three) times daily. 135 tablet 1  . HYDROcodone-acetaminophen (NORCO/VICODIN) 5-325 MG tablet Take 1-2 tablets every 6 hours as needed for severe pain 8 tablet 0  . hydrOXYzine (ATARAX/VISTARIL) 25 MG tablet Take 1 tablet (25 mg total) by mouth every 6 (six) hours as needed for anxiety. 60 tablet 0  . lithium carbonate (LITHOBID) 300 MG CR tablet Take 600 mg by mouth every evening.    . metFORMIN (GLUCOPHAGE) 500 MG tablet Take 2 tablets (1000 mg) in the morning & 1 tablet (500 mg) at bedtime: For diabetes management    . nortriptyline (PAMELOR) 25 MG capsule Take 75 mg by mouth at bedtime.    Letta Pate. ONETOUCH DELICA LANCETS 33G MISC Ck Blood sugar up to 4 times a day as directed. ICD-10 E11.69, E66.9 400 each 0  . rivaroxaban (XARELTO) 20 MG TABS tablet TAKE 1 TABLET DAILY WITH SUPPER: Blood thiner 1 tablet 0  . traZODone (DESYREL) 50 MG tablet Take 1 tablet (50 mg total) by mouth at bedtime as needed for sleep. 30 tablet 0  . Vitamin D, Ergocalciferol, (DRISDOL) 50000 units CAPS capsule Take 50,000 Units by mouth every 7 (seven) days. Sunday, Wednesday     No current facility-administered medications on file prior to visit.     ROS ROS otherwise unremarkable unless listed above.  Physical Examination: BP 127/87   Pulse 83   Temp 98.9 F (37.2 C) (Oral)   Resp 16   Ht 5\' 8"  (1.727 m)   Wt 246 lb (111.6 kg)   SpO2 97%   BMI 37.40 kg/m  Ideal Body Weight: Weight in (lb) to have BMI = 25: 164.1  Physical Exam  Constitutional: She is oriented to person,  place, and time. She appears well-developed and well-nourished. No distress.  HENT:  Head: Normocephalic and  atraumatic.  Right Ear: Tympanic membrane, external ear and ear canal normal.  Left Ear: Tympanic membrane, external ear and ear canal normal.  Nose: No mucosal edema or rhinorrhea. Right sinus exhibits no maxillary sinus tenderness and no frontal sinus tenderness. Left sinus exhibits no maxillary sinus tenderness and no frontal sinus tenderness.  Mouth/Throat: No uvula swelling. No oropharyngeal exudate, posterior oropharyngeal edema or posterior oropharyngeal erythema.  Eyes: Pupils are equal, round, and reactive to light. Conjunctivae and EOM are normal.  Cardiovascular: Normal rate and regular rhythm.  Exam reveals no gallop, no distant heart sounds and no friction rub.   No murmur heard. Pulses:      Dorsalis pedis pulses are 2+ on the right side, and 2+ on the left side.  No lower extremity swelling.  Pulmonary/Chest: Effort normal. No respiratory distress. She has no decreased breath sounds. She has no wheezes. She has no rhonchi.  Lymphadenopathy:       Head (right side): No submandibular, no tonsillar, no preauricular and no posterior auricular adenopathy present.       Head (left side): No submandibular, no tonsillar, no preauricular and no posterior auricular adenopathy present.  Neurological: She is alert and oriented to person, place, and time.  Skin: She is not diaphoretic.  Blanching erythematous mildly raised rash.  Lower right extremity with mild purpuric dense rash at the anterior of lower.  Scant erythematous rash along the medial area of the dorsum of the right foot.  Spared on left.  Blanching dense rash without purpura along the extremities, and lower back.  This is spreading scantly along the anterior of the trunk.    Psychiatric: She has a normal mood and affect. Her behavior is normal.   Procedure: cleanse with alcohol pad.  1% lidocaine injected as wheel of  procedure site.  Cleansed with povidine.  3mm punch biopsy utilized to obtain sample at the right anterior thigh.  Retrieved with forceps.  Cleansed with normal saline.  Dressing applied.   Assessment and Plan: Deborah Soto is a 51 y.o. female who is here today for rash. Vasculitis etiology hemolytic vs, auto-immune inflammatory, vs drug rash vs infectious. General labs obtained.   Biopsy obtained today. Given steroid cream.   Rash and nonspecific skin eruption - Plan: POCT urinalysis dipstick, POCT CBC, Dermatology pathology, Sedimentation Rate  Trena Platt, PA-C [facility] 8/12/201810:43 AM

## 2016-10-16 LAB — SEDIMENTATION RATE: Sed Rate: 13 mm/hr (ref 0–32)

## 2016-10-22 ENCOUNTER — Telehealth: Payer: Self-pay | Admitting: Family Medicine

## 2016-10-22 NOTE — Telephone Encounter (Signed)
ENGLISH PT CALLING ABOUT PATHOLOGY TEST

## 2016-10-23 ENCOUNTER — Encounter: Payer: Self-pay | Admitting: Physician Assistant

## 2016-10-23 ENCOUNTER — Ambulatory Visit (INDEPENDENT_AMBULATORY_CARE_PROVIDER_SITE_OTHER): Payer: 59 | Admitting: Physician Assistant

## 2016-10-23 VITALS — BP 118/84 | HR 94 | Temp 98.2°F | Resp 17 | Ht 68.5 in | Wt 245.0 lb

## 2016-10-23 DIAGNOSIS — R21 Rash and other nonspecific skin eruption: Secondary | ICD-10-CM | POA: Diagnosis not present

## 2016-10-23 DIAGNOSIS — R768 Other specified abnormal immunological findings in serum: Secondary | ICD-10-CM | POA: Diagnosis not present

## 2016-10-23 NOTE — Progress Notes (Signed)
PRIMARY CARE AT [facility] [address], [place] [address] [phone]  Date:  10/23/2016   Name:  Deborah Soto   DOB:  November 08, 1965   MRN:  [id number]  PCP:  Sigmund Hazel, MD    History of Present Illness:  Deborah Soto is a 51 y.o. female patient who presents to PCP with  Chief Complaint  Patient presents with  . Follow-up    recheck for rash      Patient is here for follow up of diffused rash that started about 10 days ago.  Patient notes that around the 2nd day following visit, the rash resolved.  She had used the topical triamcinolone cream a few times, but not twice per day. She made no change to her medications, and has proceeded to take the gabapentin 3600mg  total that she had only once, 3 days prior to the onset of the rash.   She has noted no change from the day to day, no respiratory symptoms, abnormal joint ache.   A biopsy was taken at her visit which gave the findings of: Dermatology pathology: There is a sparse superficial perivascular lymphocytic infiltrate associated with vacuolar alteration and scattered necrotic keratinocytes at the dermoepidermal junction. These are the findings of a vacuolar interface dermatitis. The histologic differential diagnosis in this setting includes a drug reaction and a connective tissue disorder such as lupus erythematosus or dermatomyositis. Correlation with the clinical and serologic findings is recommended. Sed rate was wnl at this visit as well. She has been evaluated for auto-immune illnesses which continue to be ruled out as fibromyalgia (she requested 2nd opinions)   Patient Active Problem List   Diagnosis Date Noted  . Moderate obesity 06/22/2016  . Elevated blood pressure, situational 06/22/2016  . MDD (major depressive disorder), recurrent severe, without psychosis (HCC) 06/14/2016  . Intentional drug overdose (HCC)   . Acute encephalopathy 06/11/2016  . Diabetes mellitus type 2 in obese (HCC) 06/11/2016  .  Suicidal ideation 06/11/2016  . Positive ANA (antinuclear antibody) 04/02/2016  . Hyperparathyroidism (HCC) 01/09/2016  . Snoring 09/20/2015  . Cervical disc disorder with radiculopathy of cervical region 05/12/2015  . Hepatic steatosis 04/18/2015  . Chest pain   . Dyspnea   . Achilles tendonitis 04/08/2012  . 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 PTE (pulmonary thromboembolism) (HCC) 10/10/2011  . Preeclampsia 10/10/2011  . Diverticulitis 10/10/2011  . Bipolar affective disorder (HCC) 10/10/2011  . BMI 34.0-34.9,adult 10/10/2011  . AR (allergic rhinitis) 10/10/2011    Past Medical History:  Diagnosis Date  . Anxiety   . Asthma   . Depression   . Diabetes mellitus type 2 in obese (HCC) 06/11/2016  . Embolism - blood clot January 1997 & January 2017   Also had LLE DVT in 1997  . Hypertension   . Sleep apnea     Past Surgical History:  Procedure Laterality Date  . NO PAST SURGERIES      Social History  Substance Use Topics  . Smoking status: Former Smoker    Packs/day: 1.00    Years: 20.00    Quit date: 03/12/2004  . Smokeless tobacco: Never Used  . Alcohol use 0.6 oz/week    1 Cans of beer per week     Comment: 1-2 times a week    Family History  Problem Relation Age of Onset  . Dementia Father   . Heart disease Father   . Clotting disorder Mother   . Arthritis Mother   . Clotting disorder Sister   .  Clotting disorder Maternal Grandmother   . Clotting disorder Maternal Aunt   . Rheumatologic disease Neg Hx   . Hyperparathyroidism Neg Hx     Allergies  Allergen Reactions  . Tramadol Other (See Comments)    Tingling all over     Medication list has been reviewed and updated.  Current Outpatient Prescriptions on File Prior to Visit  Medication Sig Dispense Refill  . atorvastatin (LIPITOR) 10 MG tablet Take 10 mg by mouth daily.    . busPIRone (BUSPAR) 30 MG tablet Take 30 mg by mouth 2 (two) times daily.    Marland Kitchen. gabapentin (NEURONTIN) 800 MG tablet Take 1.5  tablets (1,200 mg total) by mouth 3 (three) times daily. 135 tablet 1  . HYDROcodone-acetaminophen (NORCO/VICODIN) 5-325 MG tablet Take 1-2 tablets every 6 hours as needed for severe pain 8 tablet 0  . hydrOXYzine (ATARAX/VISTARIL) 25 MG tablet Take 0.5-1 tablets (12.5-25 mg total) by mouth 3 (three) times daily as needed for itching. 30 tablet 0  . lithium carbonate (LITHOBID) 300 MG CR tablet Take 600 mg by mouth every evening.    . metFORMIN (GLUCOPHAGE) 500 MG tablet Take 2 tablets (1000 mg) in the morning & 1 tablet (500 mg) at bedtime: For diabetes management    . nortriptyline (PAMELOR) 25 MG capsule Take 75 mg by mouth at bedtime.    Letta Pate. ONETOUCH DELICA LANCETS 33G MISC Ck Blood sugar up to 4 times a day as directed. ICD-10 E11.69, E66.9 400 each 0  . rivaroxaban (XARELTO) 20 MG TABS tablet TAKE 1 TABLET DAILY WITH SUPPER: Blood thiner 1 tablet 0  . traZODone (DESYREL) 50 MG tablet Take 1 tablet (50 mg total) by mouth at bedtime as needed for sleep. 30 tablet 0  . triamcinolone cream (KENALOG) 0.1 % Apply 1 application topically 2 (two) times daily. 80 g 0  . Vitamin D, Ergocalciferol, (DRISDOL) 50000 units CAPS capsule Take 50,000 Units by mouth every 7 (seven) days. Sunday, Wednesday     No current facility-administered medications on file prior to visit.     ROS ROS otherwise unremarkable unless listed above.  Physical Examination: BP 118/84   Pulse 94   Temp 98.2 F (36.8 C) (Oral)   Resp 17   Ht 5' 8.5" (1.74 m)   Wt 245 lb (111.1 kg)   SpO2 98%   BMI 36.71 kg/m  Ideal Body Weight: Weight in (lb) to have BMI = 25: 166.5  Physical Exam  Constitutional: She is oriented to person, place, and time. She appears well-developed and well-nourished. No distress.  HENT:  Head: Normocephalic and atraumatic.  Right Ear: External ear normal.  Left Ear: External ear normal.  Eyes: Pupils are equal, round, and reactive to light. Conjunctivae and EOM are normal.  Cardiovascular:  Normal rate and regular rhythm.  Exam reveals no friction rub.   No murmur heard. Pulmonary/Chest: Effort normal. No respiratory distress.  Neurological: She is alert and oriented to person, place, and time.  Skin: She is not diaphoretic.  Psychiatric: She has a normal mood and affect. Her behavior is normal.     Assessment and Plan: Marzetta BoardMichelle Gaymon is a 51 y.o. female who is here today or cc of follow up of rash This appears normal at this time.  There is concern with history of blood clot that there may be some auto-immune etiology.   Rash and nonspecific skin eruption - Plan: Ambulatory referral to Rheumatology  Positive ANA (antinuclear antibody) - Plan: Ambulatory referral to  Rheumatology    Trena Platt, PA-C [facility] 8/20/20188:37 AM

## 2016-10-23 NOTE — Patient Instructions (Addendum)
I am placing a referral with Dr. Corliss Skainseveshwar.  If this changes at all, in terms of provider--we will let you know.      IF you received an x-ray today, you will receive an invoice from [facility]. Please contact [facility] at [phone] with questions or concerns regarding your invoice.   IF you received labwork today, you will receive an invoice from [facility]. Please contact [facility] at [phone] with questions or concerns regarding your invoice.   Our billing staff will not be able to assist you with questions regarding bills from these companies.  You will be contacted with the lab results as soon as they are available. The fastest way to get your results is to activate your My Chart account. Instructions are located on the last page of this paperwork. If you have not heard from us regarding the results in 2 weeks, please contact this office.

## 2016-10-26 ENCOUNTER — Telehealth: Payer: Self-pay | Admitting: Physician Assistant

## 2016-10-26 NOTE — Telephone Encounter (Signed)
Pathology report is back. Please make recommendation.

## 2016-10-26 NOTE — Telephone Encounter (Signed)
Pt called concerning rheumatology referral. She said she spoke with Dr. Fatima Sanger office and they are scheduling out into December. She wanted to let Judeth Cornfield know this to see how she would like her to proceed. Pt can be contacted at [phone]. Thanks!

## 2016-10-26 NOTE — Telephone Encounter (Signed)
Discussed with patient at visit

## 2016-11-02 ENCOUNTER — Ambulatory Visit: Payer: Self-pay | Admitting: Physical Medicine & Rehabilitation

## 2016-11-05 ENCOUNTER — Other Ambulatory Visit: Payer: Self-pay | Admitting: Physician Assistant

## 2016-11-13 ENCOUNTER — Ambulatory Visit: Payer: Self-pay | Admitting: *Deleted

## 2016-11-16 ENCOUNTER — Telehealth: Payer: Self-pay | Admitting: Physician Assistant

## 2016-11-16 DIAGNOSIS — R899 Unspecified abnormal finding in specimens from other organs, systems and tissues: Secondary | ICD-10-CM

## 2016-11-16 DIAGNOSIS — R21 Rash and other nonspecific skin eruption: Secondary | ICD-10-CM

## 2016-11-16 NOTE — Telephone Encounter (Signed)
Pt came in earlier this week with a letter from [facility], the rheumatologist we referred her to. It was a financial agreement letter that they asked her to sign before being seen. The pt stated she did not feel comfortable signing this. She left a message for referrals wanting to know what Judeth CornfieldStephanie thinks she should do next. She wanted to know if she should go back to [facility] or try another office. Please advise. Pt can be reached at [phone]. Thanks.

## 2016-11-21 NOTE — Telephone Encounter (Signed)
I am going to send her to a hematologist.  I don't think she should do this at this time.  Please advise her of this.

## 2016-11-23 ENCOUNTER — Ambulatory Visit: Payer: Self-pay | Admitting: Physical Medicine & Rehabilitation

## 2016-11-26 NOTE — Telephone Encounter (Signed)
Left detailed message.   

## 2016-11-29 NOTE — Telephone Encounter (Signed)
Please refer for consult hematology

## 2016-12-05 ENCOUNTER — Encounter: Payer: Self-pay | Admitting: Hematology

## 2016-12-11 ENCOUNTER — Telehealth: Payer: Self-pay | Admitting: Hematology

## 2016-12-11 ENCOUNTER — Ambulatory Visit (HOSPITAL_BASED_OUTPATIENT_CLINIC_OR_DEPARTMENT_OTHER): Payer: 59 | Admitting: Hematology

## 2016-12-11 ENCOUNTER — Encounter: Payer: Self-pay | Admitting: Hematology

## 2016-12-11 ENCOUNTER — Ambulatory Visit (HOSPITAL_BASED_OUTPATIENT_CLINIC_OR_DEPARTMENT_OTHER): Payer: 59

## 2016-12-11 VITALS — BP 155/114 | HR 119 | Temp 98.9°F | Resp 18 | Ht 68.5 in | Wt 248.5 lb

## 2016-12-11 DIAGNOSIS — M791 Myalgia, unspecified site: Secondary | ICD-10-CM

## 2016-12-11 DIAGNOSIS — D72829 Elevated white blood cell count, unspecified: Secondary | ICD-10-CM | POA: Diagnosis not present

## 2016-12-11 DIAGNOSIS — R5383 Other fatigue: Secondary | ICD-10-CM | POA: Diagnosis not present

## 2016-12-11 DIAGNOSIS — E538 Deficiency of other specified B group vitamins: Secondary | ICD-10-CM | POA: Diagnosis not present

## 2016-12-11 DIAGNOSIS — M2550 Pain in unspecified joint: Secondary | ICD-10-CM | POA: Diagnosis not present

## 2016-12-11 LAB — COMPREHENSIVE METABOLIC PANEL
ALT: 61 U/L — ABNORMAL HIGH (ref 0–55)
AST: 38 U/L — ABNORMAL HIGH (ref 5–34)
Albumin: 4.4 g/dL (ref 3.5–5.0)
Alkaline Phosphatase: 71 U/L (ref 40–150)
Anion Gap: 11 mEq/L (ref 3–11)
BUN: 15.1 mg/dL (ref 7.0–26.0)
CO2: 24 mEq/L (ref 22–29)
Calcium: 10 mg/dL (ref 8.4–10.4)
Chloride: 104 mEq/L (ref 98–109)
Creatinine: 0.8 mg/dL (ref 0.6–1.1)
EGFR: >90 mL/min/{1.73_m2} (ref >90)
Glucose: 109 mg/dl (ref 70–140)
Potassium: 4.2 mEq/L (ref 3.5–5.1)
Sodium: 140 mEq/L (ref 136–145)
Total Bilirubin: 0.62 mg/dL (ref 0.20–1.20)
Total Protein: 7.5 g/dL (ref 6.4–8.3)

## 2016-12-11 LAB — CBC & DIFF AND RETIC
BASO%: 0.3 % (ref 0.0–2.0)
Basophils Absolute: 0 10*3/uL (ref 0.0–0.1)
EOS%: 7.9 % — ABNORMAL HIGH (ref 0.0–7.0)
Eosinophils Absolute: 0.7 10*3/uL — ABNORMAL HIGH (ref 0.0–0.5)
HCT: 43.9 % (ref 34.8–46.6)
HGB: 14.5 g/dL (ref 11.6–15.9)
Immature Retic Fract: 8.4 % (ref 1.60–10.00)
LYMPH%: 25.6 % (ref 14.0–49.7)
MCH: 30.8 pg (ref 25.1–34.0)
MCHC: 33 g/dL (ref 31.5–36.0)
MCV: 93.2 fL (ref 79.5–101.0)
MONO#: 0.7 10*3/uL (ref 0.1–0.9)
MONO%: 7.4 % (ref 0.0–14.0)
NEUT#: 5.4 10*3/uL (ref 1.5–6.5)
NEUT%: 58.8 % (ref 38.4–76.8)
Platelets: 289 10*3/uL (ref 145–400)
RBC: 4.71 10*6/uL (ref 3.70–5.45)
RDW: 13.5 % (ref 11.2–14.5)
Retic %: 2.73 % — ABNORMAL HIGH (ref 0.70–2.10)
Retic Ct Abs: 128.58 10*3/uL — ABNORMAL HIGH (ref 33.70–90.70)
WBC: 9.2 10*3/uL (ref 3.9–10.3)
lymph#: 2.4 10*3/uL (ref 0.9–3.3)

## 2016-12-11 LAB — LACTATE DEHYDROGENASE: LDH: 193 U/L (ref 125–245)

## 2016-12-11 LAB — CHCC SMEAR

## 2016-12-11 NOTE — Progress Notes (Signed)
HEMATOLOGY/ONCOLOGY CONSULTATION NOTE  Date of Service: 12/11/2016  Patient Care Team: Garnetta Buddy, [place] as PCP - General (Physician Assistant) Roslynn Amble, MD as Consulting Physician (Pulmonary Disease)  CHIEF COMPLAINTS/PURPOSE OF CONSULTATION:  Leucocytosis.  HISTORY OF PRESENTING ILLNESS:   Deborah Soto is a wonderful 51 y.o. female who has been referred to [name] by Trena Platt D, PA for evaluation and management of abnormal CBC with elevated WBCs. She is accompanied by her husband.   She had a CBC with her PCP on 10/15/2016 which showed mild leucocytosis with a WBC count of 11.6k with minimal neutrophilia of 7.5k. Counts were similar on 10/02/2016. No associated anemia/polycythemia or thrombocytopenia/thrombocytosis. Rpt labs today showed normal WBC.  She was evaluated by Dr. Dierdre Forth and her PCP for possible autoimmune condition and she notes that she was suggested a diagnosis of fibromyalgia. She notes that also pursued a 2nd rheumatology opinion that did not reveal any other new concerns. She is presenting with multiple complaints at this time. She reports having a minimally raised rash localized to her bilateral calves and thighs with a burning sensation. She notes that the rash lasted for 1 week in August 2018. Pt denies rash anywhere else on the body and states she has had no changes in medications. Rash has completely resolved at this time.  She also reports a prior hx of kidney stones in 12/2015 when she had a full workup with imaging and intervention.  Pt reports stiff joints localized to her bilateral hands and bilateral knees onset couple of months ago. She reports that she is having a hard time going up-stairs and putting on clothes due to increased pain. Pt reports an intermittent hoarse voice.  Pt reports sensitivity to heat, but denies sensitivity to the sun.  Pt reports that she uses CPAP due to sleep apnea. Pt reports that she is still taking  Xarelto following her prior hx of blood clots with no subsequent blood clots. Pt denies changes in bowel pattern. Pt LMP was 1 year ago and she denies vaginal discharge, dyspareunia, pelvic pain.  Pt reports that she is working and attending her daughter's volleyball games, although she reports that she has to sit due to pain.   On review of systems, pt reports resolved rash to BLE, generalized pain, joint stiffness, sun sensitivity, intermittent hoarse voice, vision change, dry eyes, dry mouth, and increased thirst. Pt denies vaginal discharge, dyspareunia, pelvic pain.   MEDICAL HISTORY:  Past Medical History:  Diagnosis Date  . Anxiety   . Asthma   . Depression   . Diabetes mellitus type 2 in obese (HCC) 06/11/2016  . Embolism - blood clot January 1997 & January 2017   Also had LLE DVT in 1997  . Hypertension   . Sleep apnea     SURGICAL HISTORY: Past Surgical History:  Procedure Laterality Date  . NO PAST SURGERIES      SOCIAL HISTORY: Social History   Social History  . Marital status: Married    Spouse name: N/A  . Number of children: Y  . Years of education: N/A   Occupational History  . admin assist    Social History Main Topics  . Smoking status: Former Smoker    Packs/day: 1.00    Years: 20.00    Quit date: 03/12/2004  . Smokeless tobacco: Never Used  . Alcohol use 0.6 oz/week    1 Cans of beer per week     Comment: 1-2 times a week  .  Drug use: No  . Sexual activity: Yes    Partners: Male    Birth control/ protection: None   Other Topics Concern  . Not on file   Social History Narrative   Originally from [place]. Always lived in [place]. 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 No international travel recently. Previously has been to [place] in May 1996. No mold exposure recently. No bird exposure. Does have multiple pets.     FAMILY HISTORY: Family History  Problem Relation Age of Onset  . Dementia Father   . Heart disease Father   . Clotting  disorder Mother   . Arthritis Mother   . Clotting disorder Sister   . Clotting disorder Maternal Grandmother   . Clotting disorder Maternal Aunt   . Rheumatologic disease Neg Hx   . Hyperparathyroidism Neg Hx     ALLERGIES:  is allergic to tramadol.  MEDICATIONS:  Current Outpatient Prescriptions  Medication Sig Dispense Refill  . atorvastatin (LIPITOR) 10 MG tablet Take 10 mg by mouth daily.    . busPIRone (BUSPAR) 30 MG tablet Take 30 mg by mouth 2 (two) times daily.    Marland Kitchen gabapentin (NEURONTIN) 800 MG tablet Take 1.5 tablets (1,200 mg total) by mouth 3 (three) times daily. (Patient taking differently: Take 1,200 mg by mouth 3 (three) times daily. Pt taking 2  tablets ( )) 135 tablet 1  . HYDROcodone-acetaminophen (NORCO/VICODIN) 5-325 MG tablet Take 1-2 tablets every 6 hours as needed for severe pain 8 tablet 0  . hydrOXYzine (ATARAX/VISTARIL) 25 MG tablet Take 0.5-1 tablets (12.5-25 mg total) by mouth 3 (three) times daily as needed for itching. 30 tablet 0  . lithium carbonate (LITHOBID) 300 MG CR tablet Take 600 mg by mouth every evening.    . metFORMIN (GLUCOPHAGE) 500 MG tablet Take 2 tablets (1000 mg) in the morning & 1 tablet (500 mg) at bedtime: For diabetes management    . nortriptyline (PAMELOR) 25 MG capsule Take 75 mg by mouth at bedtime.    Letta Pate DELICA LANCETS 33G MISC Ck Blood sugar up to 4 times a day as directed. ICD-10 E11.69, E66.9 400 each 0  . rivaroxaban (XARELTO) 20 MG TABS tablet TAKE 1 TABLET DAILY WITH SUPPER: Blood thiner 1 tablet 0  . traZODone (DESYREL) 50 MG tablet Take 1 tablet (50 mg total) by mouth at bedtime as needed for sleep. 30 tablet 0  . triamcinolone cream (KENALOG) 0.1 % Apply 1 application topically 2 (two) times daily. 80 g 0  . Vitamin D, Ergocalciferol, (DRISDOL) 50000 units CAPS capsule Take 50,000 Units by mouth every other day. Sunday, Wednesday      No current facility-administered medications for this visit.     REVIEW  OF SYSTEMS:    10 Point review of Systems was done is negative except as noted above.  PHYSICAL EXAMINATION:  ECOG PERFORMANCE STATUS: 2 - Symptomatic, <50% confined to bed  . Vitals:   12/11/16 1143  BP: (!) 155/114  Pulse: (!) 119  Resp: 18  Temp: 98.9 F (37.2 C)  SpO2: 99%   Filed Weights   12/11/16 1143  Weight: 248 lb 8 oz (112.7 kg)   .There is no height or weight on file to calculate BMI.  GENERAL:alert, in no acute distress and comfortable. No rash. SKIN: no acute rashes, no significant lesions EYES: conjunctiva are pink and non-injected, sclera anicteric OROPHARYNX: MMM, no exudates, no oropharyngeal erythema or ulceration NECK: supple, no JVD LYMPH:  no palpable lymphadenopathy in  the cervical, axillary or inguinal regions LUNGS: clear to auscultation b/l with normal respiratory effort HEART: regular rate & rhythm ABDOMEN:  Obese abdomen, no hepatosplenomegaly, normoactive bowel sounds , non tender, not distended. Extremity: no pedal edema PSYCH: alert & oriented x 3 with fluent speech NEURO: no focal motor/sensory deficits  LABORATORY DATA:  I have reviewed the data as listed  . CBC Latest Ref Rng & Units 12/11/2016 10/15/2016 10/02/2016  WBC 3.9 - 10.3 10e3/uL 9.2 11.6(A) 11.5(H)  Hemoglobin 11.6 - 15.9 g/dL 16.1 09.6 04.5  Hematocrit 34.8 - 46.6 % 43.9 42.8 40.2  Platelets 145 - 400 10e3/uL 289 - 311   . CBC    Component Value Date/Time   WBC 9.2 12/11/2016 1236   WBC 11.5 (H) 10/02/2016 0547   RBC 4.71 12/11/2016 1236   RBC 4.41 10/02/2016 0547   HGB 14.5 12/11/2016 1236   HCT 43.9 12/11/2016 1236   PLT 289 12/11/2016 1236   PLT 295 07/24/2016 1848   MCV 93.2 12/11/2016 1236   MCH 30.8 12/11/2016 1236   MCH 29.7 10/02/2016 0547   MCHC 33.0 12/11/2016 1236   MCHC 32.6 10/02/2016 0547   RDW 13.5 12/11/2016 1236   LYMPHSABS 2.4 12/11/2016 1236   MONOABS 0.7 12/11/2016 1236   EOSABS 0.7 (H) 12/11/2016 1236   BASOSABS 0.0 12/11/2016 1236     . CMP Latest Ref Rng & Units 12/11/2016 10/02/2016 07/24/2016  Glucose 70 - 140 mg/dl 409 811(B) 147(W)  BUN 7.0 - 26.0 mg/dL 29.$FAOZHYQMVHQIONGE_XBMWUXLKGMWNUUVOZDGUYQIHKVQQVZDG$$LOVFIEPPIRJJOACZ_YSAYTKZSWFUXNATFTDDUKGURKYHCWCBJ$ Creatinine 0.6 - 1.1 mg/dL 0.8 6.28 3.15  Sodium 176 - 145 mEq/L 140 138 140  Potassium 3.5 - 5.1 mEq/L 4.2 4.1 4.4  Chloride 101 - 111 mmol/L - 104 101  CO2 22 - 29 mEq/L Calcium 8.4 - 10.4 mg/dL 16.0 9.3 9.9  Total Protein 6.4 - 8.3 g/dL 7.5 6.1(L) -  Total Bilirubin 0.20 - 1.20 mg/dL 7.37 0.6 -  Alkaline Phos 40 - 150 U/L 71 49 -  AST 5 - 34 U/L 38(H) 29 -  ALT 0 - 55 U/L 61(H) 31 -   Component     Latest Ref Rng & Units 12/11/2016  Carnitine, Total     27 - 73 umol/L 53  Carnitine, Free     20 - 55 umol/L 38  Carnitine, Esterfied/Free     0.0 - 0.9 Ratio 0.4  DISCLAIMER      Comment  Sed Rate     0 - 32 mm/hr 21  LDH     125 - 245 U/L 193  HIV Screen 4th Generation wRfx     Non Reactive Non Reactive  Hep C Virus Ab     0.0 - 0.9 s/co ratio <0.1  Vitamin B12     232 - 1,245 pg/mL 359    RADIOGRAPHIC STUDIES: I have personally reviewed the radiological images as listed and agreed with the findings in the report. No results found.  ASSESSMENT & PLAN:   51 y.o. female with   1) Mild leucocytosis/Neutrophilia  Resolved on labs today. Likely reactive -- possible etiology  -- medication related(lithium is well know to cause leucocytosis)  Obesity .Body mass index is 37.23 kg/m. could certainly be an additional factor. LDH wnl - no clinical or lab evidence of lymphoproliferative disorder Smear - no myeloid left shift. No increased blasts Plan Labs evaluated as noted above. No additional w/u recommended for resolved leucocytosis at this time. Likely from Lithium.  2)  Fatigue/polyarthralgias B12 low normal Carnitine levels WNL Could be from her medications Per rheumatology could be fibromyalgias Plan -continue f/u as mx per PCP/rheumatology.  RTC with Dr Candise Che as needed if any new questions or concerns  arise. Continue f/u with PCP/rheumatology.  All of the patients questions were answered with apparent satisfaction. The patient knows to call the clinic with any problems, questions or concerns.  I spent 30 minutes counseling the patient face to face. The total time spent in the appointment was 45 minutes and more than 50% was on counseling and direct patient cares.    Wyvonnia Lora MD MS AAHIVMS [facility] Hematology/Oncology Physician [facility]  (Office):       [phone] (Work cell):  [phone] (Fax):           [phone]  12/11/2016 11:38 AM    This document serves as a record of services personally performed by Wyvonnia Lora, MD. It was created on her behalf by Chestine Spore, a trained medical scribe. The creation of this record is based on the scribe's personal observations and the provider's statements to them. This document has been checked and approved by the attending provider.

## 2016-12-11 NOTE — Patient Instructions (Signed)
Thank you for choosing [facility] to provide your oncology and hematology care.  To afford each patient quality time with our providers, please arrive 30 minutes before your scheduled appointment time.  If you arrive late for your appointment, you may be asked to reschedule.  We strive to give you quality time with our providers, and arriving late affects you and other patients whose appointments are after yours.   If you are a no show for multiple scheduled visits, you may be dismissed from the clinic at the providers discretion.    Again, thank you for choosing [facility], our hope is that these requests will decrease the amount of time that you wait before being seen by our physicians.  ______________________________________________________________________  Should you have questions after your visit to the [facility], please contact our office at [phone] between the hours of 8:30 and 4:30 p.m.    Voicemails left after 4:30p.m will not be returned until the following business day.    For prescription refill requests, please have your pharmacy contact us directly.  Please also try to allow 48 hours for prescription requests.    Please contact the scheduling department for questions regarding scheduling.  For scheduling of procedures such as PET scans, CT scans, MRI, Ultrasound, etc please contact central scheduling at [phone].    Resources For Cancer Patients and Caregivers:   Oncolink.org:  A wonderful resource for patients and healthcare providers for information regarding your disease, ways to tract your treatment, what to expect, etc.     American Cancer Society:  800-227-2345  Can help patients locate various types of support and financial assistance  Cancer Care: 1-800-813-HOPE (4673) Provides financial assistance, online support groups, medication/co-pay assistance.    [place] DSS:  336-641-3447 Where to apply for food  stamps, Medicaid, and utility assistance  Medicare Rights Center: 800-333-4114 Helps people with Medicare understand their rights and benefits, navigate the Medicare system, and secure the quality healthcare they deserve  SCAT: 336-333-6589 [place] [employer]'s shared-ride transportation service for eligible riders who have a disability that prevents them from riding the fixed route bus.    For additional information on assistance programs please contact our social worker:   Grier Hock/Abigail Elmore:  [phone]            

## 2016-12-11 NOTE — Telephone Encounter (Signed)
Scheduled appt per 10/2 los - rtc as needed based on labs

## 2016-12-12 ENCOUNTER — Ambulatory Visit (INDEPENDENT_AMBULATORY_CARE_PROVIDER_SITE_OTHER): Payer: 59 | Admitting: Physician Assistant

## 2016-12-12 ENCOUNTER — Encounter: Payer: Self-pay | Admitting: Physician Assistant

## 2016-12-12 ENCOUNTER — Ambulatory Visit (INDEPENDENT_AMBULATORY_CARE_PROVIDER_SITE_OTHER): Payer: 59

## 2016-12-12 VITALS — HR 114 | Temp 98.5°F | Resp 16 | Ht 68.5 in | Wt 248.0 lb

## 2016-12-12 DIAGNOSIS — M545 Low back pain, unspecified: Secondary | ICD-10-CM

## 2016-12-12 DIAGNOSIS — R1031 Right lower quadrant pain: Secondary | ICD-10-CM

## 2016-12-12 DIAGNOSIS — M79651 Pain in right thigh: Secondary | ICD-10-CM

## 2016-12-12 LAB — SEDIMENTATION RATE: Sedimentation Rate-Westergren: 21 mm/hr (ref 0–32)

## 2016-12-12 LAB — HIV ANTIBODY (ROUTINE TESTING W REFLEX): HIV Screen 4th Generation wRfx: NONREACTIVE

## 2016-12-12 LAB — VITAMIN B12: Vitamin B12: 359 pg/mL (ref 232–1245)

## 2016-12-12 LAB — HEPATITIS C ANTIBODY: Hep C Virus Ab: <0.1 s/co ratio (ref 0.0–0.9)

## 2016-12-12 MED ORDER — OMEPRAZOLE 40 MG PO CPDR: 40.0000 mg | DELAYED_RELEASE_CAPSULE | Freq: Every day | ORAL | 1 refills | Status: DC

## 2016-12-12 MED ORDER — LIDOCAINE 5 % EX PTCH: 1.0000 | MEDICATED_PATCH | CUTANEOUS | 0 refills | Status: DC

## 2016-12-12 NOTE — Patient Instructions (Signed)
I am referring you to orthopedist.  The xrays did not look remarkable for your symptoms of the groin pain.  If you do not hear anything within 7 days, please call us.  I have placed it already.  I am giving you this gerd restricted diet.  Continue to hydrate well.  If your symptoms do not improve within 2 weeks, return here.  We may need to see gastroenterology for possible endoscopy.    Food Choices for Gastroesophageal Reflux Disease, Adult When you have gastroesophageal reflux disease (GERD), the foods you eat and your eating habits are very important. Choosing the right foods can help ease your discomfort. What guidelines do I need to follow?  Choose fruits, vegetables, whole grains, and low-fat dairy products.  Choose low-fat meat, fish, and poultry.  Limit fats such as oils, salad dressings, butter, nuts, and avocado.  Keep a food diary. This helps you identify foods that cause symptoms.  Avoid foods that cause symptoms. These may be different for everyone.  Eat small meals often instead of 3 large meals a day.  Eat your meals slowly, in a place where you are relaxed.  Limit fried foods.  Cook foods using methods other than frying.  Avoid drinking alcohol.  Avoid drinking large amounts of liquids with your meals.  Avoid bending over or lying down until 2-3 hours after eating. What foods are not recommended? These are some foods and drinks that may make your symptoms worse: Vegetables Tomatoes. Tomato juice. Tomato and spaghetti sauce. Chili peppers. Onion and garlic. Horseradish. Fruits Oranges, grapefruit, and lemon (fruit and juice). Meats High-fat meats, fish, and poultry. This includes hot dogs, ribs, ham, sausage, salami, and bacon. Dairy Whole milk and chocolate milk. Sour cream. Cream. Butter. Ice cream. Cream cheese. Drinks Coffee and tea. Bubbly (carbonated) drinks or energy drinks. Condiments Hot sauce. Barbecue sauce. Sweets/Desserts Chocolate and cocoa.  Donuts. Peppermint and spearmint. Fats and Oils High-fat foods. This includes [place] fries and potato chips. Other Vinegar. Strong spices. This includes black pepper, white pepper, red pepper, cayenne, curry powder, cloves, ginger, and chili powder. The items listed above may not be a complete list of foods and drinks to avoid. Contact your dietitian for more information. This information is not intended to replace advice given to you by your health care provider. Make sure you discuss any questions you have with your health care provider. Document Released: 08/28/2011 Document Revised: 08/04/2015 Document Reviewed: 12/31/2012 Elsevier Interactive Patient Education  2017 [employer].

## 2016-12-12 NOTE — Progress Notes (Signed)
PRIMARY CARE AT [facility] [address] [place] [address] [phone]  Date:  12/12/2016   Name:  Deborah Soto   DOB:  September 08, 1965   MRN:  [id number]  PCP:  Garnetta Buddy, PA    History of Present Illness:  Deborah Soto is a 51 y.o. female patient who presents to PCP with  Chief Complaint  Patient presents with  . Follow-up    chronic pain and fatigue, voice hoarsness for past few weeks      She is feeling weak  Feels like there is difficulty  She has pain in her right leg at her inner thigh/groin.  No numbness or tinging in that area.  She had difficulty with walking during travel.   She will lead with her left leg.    She has some hoarseness intermittently for the last 2 weeks.  She attempted allegra for 1 week which has no improved. No difficulty swallowing solids or liquids, no heart burn, or sour taste.  No nausea.     Patient Active Problem List   Diagnosis Date Noted  . Moderate obesity 06/22/2016  . Elevated blood pressure, situational 06/22/2016  . MDD (major depressive disorder), recurrent severe, without psychosis (HCC) 06/14/2016  . Intentional drug overdose (HCC)   . Acute encephalopathy 06/11/2016  . Diabetes mellitus type 2 in obese (HCC) 06/11/2016  . Suicidal ideation 06/11/2016  . Positive ANA (antinuclear antibody) 04/02/2016  . Hyperparathyroidism (HCC) 01/09/2016  . Snoring 09/20/2015  . Cervical disc disorder with radiculopathy of cervical region 05/12/2015  . Hepatic steatosis 04/18/2015  . Chest pain   . Dyspnea   . Achilles tendonitis 04/08/2012  . 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 PTE (pulmonary thromboembolism) (HCC) 10/10/2011  . Preeclampsia 10/10/2011  . Diverticulitis 10/10/2011  . Bipolar affective disorder (HCC) 10/10/2011  . BMI 34.0-34.9,adult 10/10/2011  . AR (allergic rhinitis) 10/10/2011    Past Medical History:  Diagnosis Date  . Anxiety   . Asthma   . Depression   . Diabetes mellitus type 2 in obese (HCC)  06/11/2016  . Embolism - blood clot January 1997 & January 2017   Also had LLE DVT in 1997  . Hypertension   . Sleep apnea     Past Surgical History:  Procedure Laterality Date  . NO PAST SURGERIES      Social History  Substance Use Topics  . Smoking status: Former Smoker    Packs/day: 1.00    Years: 20.00    Quit date: 03/12/2004  . Smokeless tobacco: Never Used  . Alcohol use 0.6 oz/week    1 Cans of beer per week     Comment: 1-2 times a week    Family History  Problem Relation Age of Onset  . Dementia Father   . Heart disease Father   . Clotting disorder Mother   . Arthritis Mother   . Clotting disorder Sister   . Clotting disorder Maternal Grandmother   . Clotting disorder Maternal Aunt   . Rheumatologic disease Neg Hx   . Hyperparathyroidism Neg Hx     Allergies  Allergen Reactions  . Tramadol Other (See Comments)    Tingling all over     Medication list has been reviewed and updated.  Current Outpatient Prescriptions on File Prior to Visit  Medication Sig Dispense Refill  . atorvastatin (LIPITOR) 10 MG tablet Take 10 mg by mouth daily.    . busPIRone (BUSPAR) 30 MG tablet Take 30 mg by mouth 2 (two) times  daily.    . hydrOXYzine (ATARAX/VISTARIL) 25 MG tablet Take 0.5-1 tablets (12.5-25 mg total) by mouth 3 (three) times daily as needed for itching. (Patient taking differently: Take 12.5-25 mg by mouth 3 (three) times daily as needed for anxiety. ) 30 tablet 0  . lithium carbonate (LITHOBID) 300 MG CR tablet Take 300 mg by mouth 3 (three) times daily.     . metFORMIN (GLUCOPHAGE) 500 MG tablet Take 2 tablets (1000 mg) in the morning & 1 tablet (500 mg) at bedtime: For diabetes management    . nortriptyline (PAMELOR) 25 MG capsule Take 100 mg by mouth at bedtime.     Letta Pate DELICA LANCETS 33G MISC Ck Blood sugar up to 4 times a day as directed. ICD-10 E11.69, E66.9 400 each 0  . rivaroxaban (XARELTO) 20 MG TABS tablet TAKE 1 TABLET DAILY WITH SUPPER:  Blood thiner (Patient taking differently: 10 mg daily. TAKE 1 TABLET DAILY WITH SUPPER: Blood thiner) 1 tablet 0  . Vitamin D, Ergocalciferol, (DRISDOL) 50000 units CAPS capsule Take 50,000 Units by mouth every other day. Sunday, Wednesday     . gabapentin (NEURONTIN) 800 MG tablet Take 1.5 tablets (1,200 mg total) by mouth 3 (three) times daily. (Patient taking differently: Take 1,200 mg by mouth 3 (three) times daily. Pt taking 2  tablets ( )) 135 tablet 1   No current facility-administered medications on file prior to visit.    Wt Readings from Last 3 Encounters:  12/12/16 248 lb (112.5 kg)  12/11/16 248 lb 8 oz (112.7 kg)  10/23/16 245 lb (111.1 kg)     ROS ROS otherwise unremarkable unless listed above.  Physical Examination: Pulse (!) 114   Temp 98.5 F (36.9 C)   Resp 16   Ht 5' 8.5" (1.74 m)   Wt 248 lb (112.5 kg)   SpO2 97%   BMI 37.16 kg/m  Ideal Body Weight: Weight in (lb) to have BMI = 25: 166.5  Physical Exam  Constitutional: She is oriented to person, place, and time. She appears well-developed and well-nourished. No distress.  HENT:  Head: Normocephalic and atraumatic.  Right Ear: External ear normal.  Left Ear: External ear normal.  Eyes: Pupils are equal, round, and reactive to light. Conjunctivae and EOM are normal.  Cardiovascular: Normal rate.   Pulmonary/Chest: Effort normal. No respiratory distress.  Musculoskeletal:       Right hip: She exhibits decreased strength (hip flexion ) and tenderness (medial tenderness proximal to groin.  no erythema or swelling noted.).  Neurological: She is alert and oriented to person, place, and time.  Skin: She is not diaphoretic.  Psychiatric: She has a normal mood and affect. Her behavior is normal.    Dg Lumbar Spine Complete  Result Date: 12/12/2016 CLINICAL DATA:  Low back pain. Right groin pain. Right hip weakness. EXAM: LUMBAR SPINE - COMPLETE 4+ VIEW COMPARISON:  Abdominal radiograph 07/31/2016. CT  abdomen and pelvis 07/27/2016. FINDINGS: There are 5 non rib-bearing lumbar type vertebrae. Vertebral alignment is normal. Vertebral body heights are preserved without evidence of fracture. Very mild degenerative endplate spurring is present throughout the lumbar spine. Disc space heights are preserved. No pars defects are identified. There is mild lower lumbar facet arthrosis. Soft tissues are unremarkable. IMPRESSION: 1. Very mild lumbar spondylosis and facet arthrosis. 2. No evidence of acute osseous abnormality. Electronically Signed   By: Sebastian Ache M.D.   On: 12/12/2016 10:45   Dg Hip Unilat W Or W/o Pelvis 2-3 Views Right  Result Date: 12/12/2016 CLINICAL DATA:  RIGHT hip pain and weakness.  Tender in the groin. EXAM: DG HIP (WITH OR WITHOUT PELVIS) 2-3V RIGHT COMPARISON:  None. FINDINGS: There is no evidence of hip fracture or dislocation. There is no evidence of arthropathy or other focal bone abnormality. IMPRESSION: Negative. Electronically Signed   By: Elsie Stain M.D.   On: 12/12/2016 10:45    Assessment and Plan: Deborah Soto is a 51 y.o. female who is here today for cc of  Chief Complaint  Patient presents with  . Follow-up    chronic pain and fatigue, voice hoarsness for past few weeks    Right groin pain - Plan: DG Lumbar Spine Complete, DG HIP UNILAT W OR W/O PELVIS 2-3 VIEWS RIGHT, AMB referral to orthopedics  Right thigh pain - Plan: AMB referral to orthopedics  Lumbar pain - Plan: AMB referral to orthopedics  Trena Platt, PA-C [facility] 10/7/201810:38 AM

## 2016-12-13 ENCOUNTER — Telehealth: Payer: Self-pay

## 2016-12-13 LAB — CARNITINE / ACYLCARNITINE PROFILE, BLD
Carnitine, Esterfied/Free: 0.4 Ratio (ref 0.0–0.9)
Carnitine, Free: 38 umol/L (ref 20–55)
Carnitine, Total: 53 umol/L (ref 27–73)

## 2016-12-13 NOTE — Telephone Encounter (Signed)
Started PA for Lidocaine patches.  Received immediate approval for one year.12/13/2016-12/13/2017. Pa #[id number]. Pharmacy aware.  They will notify patient.

## 2016-12-27 ENCOUNTER — Encounter: Payer: Self-pay | Admitting: Hematology and Oncology

## 2016-12-31 ENCOUNTER — Encounter: Payer: Self-pay | Admitting: Hematology and Oncology

## 2016-12-31 ENCOUNTER — Telehealth: Payer: Self-pay | Admitting: Physician Assistant

## 2016-12-31 NOTE — Telephone Encounter (Signed)
Pt called stating she had a neuro appt and MRI back in August with [facility]. She said the earliest she can get an appt with the [facility] neurologist is in February to read the  MRIs. Pt called wanting to know if she could be referred to a neurologist in town to have those read sooner. Please advise. Pt can be reached at [phone]. Thanks!

## 2017-01-01 ENCOUNTER — Telehealth: Payer: Self-pay | Admitting: *Deleted

## 2017-01-01 NOTE — Telephone Encounter (Signed)
Can we do a neurology referral?

## 2017-01-01 NOTE — Telephone Encounter (Signed)
She would need to see me if she would like a refill.  Thanks.

## 2017-01-01 NOTE — Telephone Encounter (Signed)
Pharmacy has called for refill on Deborah Soto gabapentin 1200 mg tid.  She is needing refill.  She was last seen in August and canceled appt with Dr Wynn BankerKirsteins in Aug and the one in Sept with you. Nothing is currently scheduled.  Please advise.

## 2017-01-01 NOTE — Telephone Encounter (Signed)
If another neurologist can read MRI results from different office, we could send referral to a local office in [place]. If this can be done, I will be happy to send referral once placed. Thanks!

## 2017-01-01 NOTE — Telephone Encounter (Signed)
Can we contact her orthopedist, and they may be able to read it for her.

## 2017-01-02 NOTE — Telephone Encounter (Signed)
Patient has been advised she would need an office visit for further refills, per Dr. Allena KatzPatel. Patient states "she has yet seen her neurologist and has yet received her MRI results. That is why she hasn't followed up with Dr.Patel". Patient also states her PCP refilled medication gabapentin.  She will give our office a call back to schedule once she has seen her Neurologist.

## 2017-01-08 NOTE — Telephone Encounter (Signed)
Spoke with Thayer Ohmhris at [facility] and she said they could send a message to the doctor to see if he could read MRI, but more than likely the doctor that ordered it would have to. I spoke with the pt to update her and she said she did not think her MRI from neuro should be read by ortho and it needed to be read by neurologist. I told pt if there was another step we could take we would let her know. Thanks.

## 2017-01-25 ENCOUNTER — Institutional Professional Consult (permissible substitution) (HOSPITAL_COMMUNITY): Payer: Self-pay | Admitting: Psychiatry

## 2017-02-13 ENCOUNTER — Ambulatory Visit (INDEPENDENT_AMBULATORY_CARE_PROVIDER_SITE_OTHER): Payer: 59 | Admitting: Psychiatry

## 2017-02-13 ENCOUNTER — Encounter (HOSPITAL_COMMUNITY): Payer: Self-pay | Admitting: Psychiatry

## 2017-02-13 DIAGNOSIS — Z818 Family history of other mental and behavioral disorders: Secondary | ICD-10-CM | POA: Diagnosis not present

## 2017-02-13 DIAGNOSIS — Z915 Personal history of self-harm: Secondary | ICD-10-CM

## 2017-02-13 DIAGNOSIS — F4310 Post-traumatic stress disorder, unspecified: Secondary | ICD-10-CM | POA: Diagnosis not present

## 2017-02-13 DIAGNOSIS — F332 Major depressive disorder, recurrent severe without psychotic features: Secondary | ICD-10-CM | POA: Diagnosis not present

## 2017-02-13 DIAGNOSIS — Z87891 Personal history of nicotine dependence: Secondary | ICD-10-CM

## 2017-02-13 NOTE — Progress Notes (Signed)
Psychiatric Initial Adult Assessment   Patient Identification: Deborah Soto MRN:  [id number] Date of Evaluation:  02/13/2017 Referral Source: Dr. Clovis Pu Chief Complaint:  [place] [facility] referral Visit Diagnosis:    ICD-10-CM   1. MDD (major depressive disorder), recurrent severe, without psychosis ([place]) F33.2     History of Present Illness:  Deborah Soto presents for a [facility] assessment.  She has been tried on a myriad of psychiatric medications, including Prozac, Zoloft, nortriptyline, Emsam, lithium, multiple mood stabilizers and antipsychotics.  She is currently followed by Dr. Clovis Pu at [facility] psychiatric.  She has been receiving psychiatric medication management with their office for over 10 years.  She has been diagnosed with severe recurrent major depressive disorder.  She has had periods of mood improvement for brief periods of time, but this most recent episode of depression has endured for over 1 year without relief.  She has had passive suicidal thoughts, and was most recently psychiatrically hospitalized in April 2018 at [facility].  I spent time with the patient educating her about [place], and its mechanism of action.  I reviewed the risks and benefits with her including the potential risk of seizure.  Discussed common side effects and intolerance at the treatment site.  She does not have any metallic implants or medical contraindications to preclude treatment with [facility].  She reports that she would be able to drive herself to and from treatment consistently for 7 weeks.  I also recommended she establish individual therapy on a more consistent basis and specifically discussed the utility of dialectical behavioral therapy given her chronic suicidal thoughts and multiple psychiatric hospitalizations.  She and her husband were both present and were able to ask questions regarding treatment and expected effects.  They would ultimately like to proceed with treatment pending insurance  authorization.  She will continue occasion management with her primary psychiatrist and I have referred her for individual therapy/DBT at [facility] counseling.  Past Psychiatric History: multiple inpatient admission, IOP, PHP  Previous Psychotropic Medications: Yes   Substance Abuse History in the last 12 months:  No.  Consequences of Substance Abuse: Negative  Past Medical History:  Past Medical History:  Diagnosis Date  . Anxiety   . Asthma   . Depression   . Diabetes mellitus type 2 in obese ([place]) 06/11/2016  . Embolism - blood clot January 1997 & January 2017   Also had LLE DVT in 1997  . Hypertension   . Sleep apnea     Past Surgical History:  Procedure Laterality Date  . NO PAST SURGERIES      Family Psychiatric History: as below  Family History:  Family History  Problem Relation Age of Onset  . Dementia Father   . Heart disease Father   . Clotting disorder Mother   . Arthritis Mother   . Clotting disorder Sister   . Clotting disorder Maternal Grandmother   . Clotting disorder Maternal Aunt   . Rheumatologic disease Neg Hx   . Hyperparathyroidism Neg Hx     Social History:   Social History   Socioeconomic History  . Marital status: Married    Spouse name: Not on file  . Number of children: Y  . Years of education: Not on file  . Highest education level: Not on file  Social Needs  . Financial resource strain: Not on file  . Food insecurity - worry: Not on file  . Food insecurity - inability: Not on file  . Transportation needs - medical: Not on  file  . Transportation needs - non-medical: Not on file  Occupational History  . Occupation: admin assist  Tobacco Use  . Smoking status: Former Smoker    Packs/day: 1.00    Years: 20.00    Pack years: 20.00    Last attempt to quit: 03/12/2004    Years since quitting: 12.9  . Smokeless tobacco: Never Used  Substance and Sexual Activity  . Alcohol use: Yes    Alcohol/week: 0.6 oz    Types: 1 Cans of beer  per week    Comment: 1-2 times a week  . Drug use: No  . Sexual activity: Yes    Partners: Male    Birth control/protection: None  Other Topics Concern  . Not on file  Social History Narrative   Originally from [place]. Always lived in [place]. 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 No international travel recently. Previously has been to [place] in May 1996. No mold exposure recently. No bird exposure. Does have multiple pets.     Additional Social History: works with Astron[employer], married, no substance use  Allergies:   Allergies  Allergen Reactions  . Tramadol Other (See Comments)    Tingling all over     Metabolic Disorder Labs: Lab Results  Component Value Date   HGBA1C 7.6 (H) 05/30/2016   No results found for: PROLACTIN Lab Results  Component Value Date   CHOL 203 (H) 10/10/2011   TRIG 123 10/10/2011   HDL 48 10/10/2011   CHOLHDL 4.2 10/10/2011   VLDL 25 10/10/2011   LDLCALC 130 (H) 10/10/2011     Current Medications: Current Outpatient Medications  Medication Sig Dispense Refill  . atorvastatin (LIPITOR) 10 MG tablet Take 10 mg by mouth daily.    . busPIRone (BUSPAR) 30 MG tablet Take 30 mg by mouth 2 (two) times daily.    . fluorometholone (FML) 0.1 % ophthalmic suspension     . gabapentin (NEURONTIN) 800 MG tablet Take 1.5 tablets (1,200 mg total) by mouth 3 (three) times daily. (Patient taking differently: Take 1,200 mg by mouth 3 (three) times daily. Pt taking 2 '600mg'$  tablets ('1200mg'$ )) 135 tablet 1  . hydrOXYzine (ATARAX/VISTARIL) 25 MG tablet Take 0.5-1 tablets (12.5-25 mg total) by mouth 3 (three) times daily as needed for itching. (Patient taking differently: Take 12.5-25 mg by mouth 3 (three) times daily as needed for anxiety. ) 30 tablet 0  . lidocaine (LIDODERM) 5 % Place 1 patch onto the skin daily. Remove & Discard patch within 12 hours or as directed by MD 30 patch 0  . lithium carbonate (LITHOBID) 300 MG CR tablet Take 300 mg by mouth 3 (three)  times daily.     . metFORMIN (GLUCOPHAGE) 500 MG tablet Take 2 tablets (1000 mg) in the morning & 1 tablet (500 mg) at bedtime: For diabetes management    . nortriptyline (PAMELOR) 25 MG capsule Take 100 mg by mouth at bedtime.     Marland Kitchen omeprazole (PRILOSEC) 40 MG capsule Take 1 capsule (40 mg total) by mouth daily. 30 capsule 1  . ONETOUCH DELICA LANCETS 16X MISC Ck Blood sugar up to 4 times a day as directed. ICD-10 E11.69, E66.9 400 each 0  . rivaroxaban (XARELTO) 20 MG TABS tablet TAKE 1 TABLET DAILY WITH SUPPER: Blood thiner (Patient taking differently: 10 mg daily. TAKE 1 TABLET DAILY WITH SUPPER: Blood thiner) 1 tablet 0  . Vitamin D, Ergocalciferol, (DRISDOL) 50000 units CAPS capsule Take 50,000 Units by mouth every other day.  Sunday, Wednesday      No current facility-administered medications for this visit.     Neurologic: Headache: Negative Seizure: Negative Paresthesias:Negative  Musculoskeletal: Strength & Muscle Tone: within normal limits Gait & Station: normal Patient leans: N/A  Psychiatric Specialty Exam: ROS  There were no vitals taken for this visit.There is no height or weight on file to calculate BMI.  General Appearance: Casual and Fairly Groomed  Eye Contact:  Fair  Speech:  Clear and Coherent  Volume:  Normal  Mood:  Anxious, Depressed and Dysphoric  Affect:  Tearful  Thought Process:  Coherent, Goal Directed and Descriptions of Associations: Intact  Orientation:  Full (Time, Place, and Person)  Thought Content:  Logical  Suicidal Thoughts:  Yes.  without intent/plan  Homicidal Thoughts:  No  Memory:  Immediate;   Fair  Judgement:  Fair  Insight:  Fair  Psychomotor Activity:  Normal  Concentration:  Attention Span: Good  Recall:  [employer] of Knowledge:Fair  Language: Fair  Akathisia:  Negative  Handed:  Right  AIMS (if indicated):  0  Assets:  Communication Skills Desire for Improvement Financial Resources/Insurance Housing  ADL's:  Intact   Cognition: WNL  Sleep:  fair    Treatment Plan Summary: Jaxie Racanelli is a 51 year old female with a history of severe major depressive disorder who is failed multiple psychiatric medications as above.  She has a comorbid history of PTSD from a volatile childhood.  She has a history of multiple suicide attempts and chronic suicidality, but does not present with any acute intention to harm herself.  She is receptive to continuing and medication management and individual therapy, and is agreeable to proceed with [facility].  She does not have any medical contraindications to [place] and I reviewed the risks and benefits including the potential risk of seizure.  1. MDD (major depressive disorder), recurrent severe, without psychosis ([place])     Status of current problems: unchanged  Labs Ordered: No orders of the defined types were placed in this encounter.   Labs Reviewed: n/a  Collateral Obtained/Records Reviewed: [facility] hospitalization, husband present and provides collateral regarding her and during struggle with depression  Plan:  Recommend [facility] for treatment resistant severe major depressive disorder  I spent 40 minutes with the patient in direct face-to-face clinical care.  Greater than 50% of this time was spent in counseling and coordination of care with the patient.    Aundra Dubin, MD 12/5/20182:32 PM

## 2017-02-22 ENCOUNTER — Encounter: Payer: Self-pay | Admitting: Family Medicine

## 2017-02-22 ENCOUNTER — Ambulatory Visit (INDEPENDENT_AMBULATORY_CARE_PROVIDER_SITE_OTHER): Payer: 59 | Admitting: Family Medicine

## 2017-02-22 VITALS — BP 124/64 | HR 108 | Temp 98.4°F | Ht 68.0 in | Wt 244.0 lb

## 2017-02-22 DIAGNOSIS — R197 Diarrhea, unspecified: Secondary | ICD-10-CM

## 2017-02-22 DIAGNOSIS — R5383 Other fatigue: Secondary | ICD-10-CM | POA: Diagnosis not present

## 2017-02-22 DIAGNOSIS — M791 Myalgia, unspecified site: Secondary | ICD-10-CM

## 2017-02-22 DIAGNOSIS — E1169 Type 2 diabetes mellitus with other specified complication: Secondary | ICD-10-CM

## 2017-02-22 DIAGNOSIS — E669 Obesity, unspecified: Secondary | ICD-10-CM | POA: Diagnosis not present

## 2017-02-22 DIAGNOSIS — R109 Unspecified abdominal pain: Secondary | ICD-10-CM

## 2017-02-22 DIAGNOSIS — R52 Pain, unspecified: Secondary | ICD-10-CM | POA: Diagnosis not present

## 2017-02-22 DIAGNOSIS — R251 Tremor, unspecified: Secondary | ICD-10-CM

## 2017-02-22 DIAGNOSIS — R202 Paresthesia of skin: Secondary | ICD-10-CM

## 2017-02-22 DIAGNOSIS — F332 Major depressive disorder, recurrent severe without psychotic features: Secondary | ICD-10-CM | POA: Diagnosis not present

## 2017-02-22 LAB — SEDIMENTATION RATE: Sed Rate: 5 mm/hr (ref 0–20)

## 2017-02-22 LAB — CBC
HCT: 42.6 % (ref 36.0–46.0)
Hemoglobin: 14 g/dL (ref 12.0–15.0)
MCHC: 33 g/dL (ref 30.0–36.0)
MCV: 92 fl (ref 78.0–100.0)
Platelets: 320 10*3/uL (ref 150.0–400.0)
RBC: 4.63 Mil/uL (ref 3.87–5.11)
RDW: 13.8 % (ref 11.5–15.5)
WBC: 8.8 10*3/uL (ref 4.0–10.5)

## 2017-02-22 LAB — TSH: TSH: 3.81 u[IU]/mL (ref 0.35–4.50)

## 2017-02-22 LAB — COMPREHENSIVE METABOLIC PANEL
ALT: 46 U/L — ABNORMAL HIGH (ref 0–35)
AST: 29 U/L (ref 0–37)
Albumin: 4.6 g/dL (ref 3.5–5.2)
Alkaline Phosphatase: 63 U/L (ref 39–117)
BUN: 12 mg/dL (ref 6–23)
CO2: 29 mEq/L (ref 19–32)
Calcium: 9.7 mg/dL (ref 8.4–10.5)
Chloride: 104 mEq/L (ref 96–112)
Creatinine, Ser: 0.66 mg/dL (ref 0.40–1.20)
GFR: 101.07 mL/min (ref >60.00)
Glucose, Bld: 130 mg/dL — ABNORMAL HIGH (ref 70–99)
Potassium: 4 mEq/L (ref 3.5–5.1)
Sodium: 142 mEq/L (ref 135–145)
Total Bilirubin: 0.6 mg/dL (ref 0.2–1.2)
Total Protein: 6.8 g/dL (ref 6.0–8.3)

## 2017-02-22 LAB — POCT GLYCOSYLATED HEMOGLOBIN (HGB A1C): Hemoglobin A1C: 6.2

## 2017-02-22 LAB — VITAMIN D 25 HYDROXY (VIT D DEFICIENCY, FRACTURES): VITD: 64.39 ng/mL (ref 30.00–100.00)

## 2017-02-22 LAB — VITAMIN B12: Vitamin B-12: 192 pg/mL — ABNORMAL LOW (ref 211–911)

## 2017-02-22 LAB — C-REACTIVE PROTEIN: CRP: 1.5 mg/dL (ref 0.5–20.0)

## 2017-02-22 NOTE — Progress Notes (Signed)
Subjective:  Deborah Soto is a 51 y.o. female who presents today with a chief complaint of chronic pain and to establish care.   HPI:  Summary of Neurology visit 10/03/2016 at [facility]: Patient presented on 10/03/2016 for initial consult for evaluation for possible MS and chronic pain.  At that visit patient described chronic pain around her wrist, shoulder, knee, and other body areas.  Also described paresthesia-like sensation in her hands.  Given the bilateral distribution of her pain, neurology thought this possibly represented a myelopathy type process.  It was thought that a brain lesion was less likely.  She had an MRI done of her cervical and thoracic spine (see objective section for summary/review.)   Chronic Pain, new problem Symptoms started about 2 years ago.  She has pain in her lower extremities, upper extremities, neck, and mid back to flanks.  She has been seen by several physicians in the past including 2 rheumatologist for this.  Symptoms have worsened over the last couple of years.  Her previous primary care physician noted that she did have a positive ANA and she was sent to rheumatology for further evaluation.  Her rheumatologist then told her that this was likely a neurological condition and referred her to neurology.  She had a cervical and thoracic MRI done.  She had follow-up appointment scheduled with neurology, however she had to have it rescheduled and was unable to get back in.  She does not have any clear precipitating events.  No weakness.  No numbness.  No vision changes.  No syncope.  She has occasional bilateral hand tremors.  Pain is variable in characteristic.  Sometimes it is a sharp shooting pain in her upper extremities and neck.  Occasionally it is a aching sensation in her back and legs.  She will sometimes have joint pains throughout her upper extremities and lower extremities.  The pains in her joints will last for a few days and then subside.  She did have a  rash recently that was biopsied and consistent with drug eruption versus autoimmune disorder.  She had workup done at her initial rheumatology appointment, however is unsure of the results.  Patient does note that she has some organic etiology for her pain.  She has been diagnosed with cervical degenerative disc disease and is currently seeing orthopedics at [facility] for treatment of this.  Abdominal pain, new issue Over the past month, patient has noticed serial abdominal pain and diarrhea.  Symptoms have worsened over the last couple of weeks.  No obvious precipitating events.  Symptoms typically occur right after eating food.  Pain improves with bowel movement.  She is on metformin but has not changed her dose recently.  No other medication changes.  No nausea or vomiting.  No fevers or chills.  No weight loss.  No night sweats.  Type 2 diabetes, new issue Long-standing history.  Currently on metformin 1500 mg total daily.  No obvious side effects aside from those mentioned above.    Depression/anxiety, new issue Currently managed by psychiatry.  She has currently on BuSpar 30 mg twice daily, hydroxyzine 25 mg 3 times daily as needed, Pamelor 150 mg nightly.  Hoarse voice, new issue Also started within the past couple of months.  Initially thought this was related to acid reflux and patient was started on Prilosec.  Notes that this did not help with her symptoms however did cause her to have dry eyes and dry mouth.  ROS: Per HPI, otherwise a 14  point review of systems was performed and was negative  PMH:  The following were reviewed and entered/updated in epic: Past Medical History:  Diagnosis Date  . Anxiety   . Asthma   . Depression   . Diabetes mellitus type 2 in obese ([place]) 06/11/2016  . Embolism - blood clot January 1997 & January 2017   Also had LLE DVT in 1997  . Hypertension   . Preeclampsia 10/10/2011   1999   . Sleep apnea    Patient Active Problem List   Diagnosis  Date Noted  . Diffuse pain 02/22/2017  . Elevated blood pressure, situational 06/22/2016  . MDD (major depressive disorder), recurrent severe, without psychosis ([place]) 06/14/2016  . Intentional drug overdose ([place])   . Diabetes mellitus type 2 in obese ([place]) 06/11/2016  . Positive ANA (antinuclear antibody) 04/02/2016  . Hyperparathyroidism ([place]) 01/09/2016  . Snoring 09/20/2015  . Cervical disc disorder with radiculopathy of cervical region 05/12/2015  . Hepatic steatosis 04/18/2015  . Achilles tendonitis 04/08/2012  . PTE (pulmonary thromboembolism) ([place]) 10/10/2011  . Bipolar affective disorder ([place]) 10/10/2011  . BMI 34.0-34.9,adult 10/10/2011  . AR (allergic rhinitis) 10/10/2011   Past Surgical History:  Procedure Laterality Date  . NO PAST SURGERIES      Family History  Problem Relation Age of Onset  . Dementia Father   . Heart disease Father   . Clotting disorder Mother   . Arthritis Mother   . Clotting disorder Sister   . Clotting disorder Maternal Grandmother   . Clotting disorder Maternal Aunt   . Rheumatologic disease Neg Hx   . Hyperparathyroidism Neg Hx     Medications- reviewed and updated Current Outpatient Medications  Medication Sig Dispense Refill  . atorvastatin (LIPITOR) 10 MG tablet Take 10 mg by mouth daily.    . busPIRone (BUSPAR) 30 MG tablet Take 30 mg by mouth 2 (two) times daily.    . cyclobenzaprine (FLEXERIL) 10 MG tablet Take 10 mg by mouth daily as needed.    . hydrOXYzine (ATARAX/VISTARIL) 25 MG tablet Take 0.5-1 tablets (12.5-25 mg total) by mouth 3 (three) times daily as needed for itching. (Patient taking differently: Take 12.5-25 mg by mouth 3 (three) times daily as needed for anxiety. ) 30 tablet 0  . lidocaine (LIDODERM) 5 % Place 1 patch onto the skin daily. Remove & Discard patch within 12 hours or as directed by MD 30 patch 0  . lithium carbonate (LITHOBID) 300 MG CR tablet Take 300 mg by mouth 3 (three) times daily.     . metFORMIN  (GLUCOPHAGE) 500 MG tablet Take 2 tablets (1000 mg) in the morning & 1 tablet (500 mg) at bedtime: For diabetes management    . nortriptyline (PAMELOR) 50 MG capsule Take 150 mg by mouth at bedtime.     Glory Rosebush DELICA LANCETS 22G MISC Ck Blood sugar up to 4 times a day as directed. ICD-10 E11.69, E66.9 400 each 0  . rivaroxaban (XARELTO) 20 MG TABS tablet TAKE 1 TABLET DAILY WITH SUPPER: Blood thiner (Patient taking differently: 10 mg daily. TAKE 1 TABLET DAILY WITH SUPPER: Blood thiner) 1 tablet 0  . Vitamin D, Ergocalciferol, (DRISDOL) 50000 units CAPS capsule Take 50,000 Units by mouth every other day. Sunday, Wednesday      No current facility-administered medications for this visit.     Allergies-reviewed and updated Allergies  Allergen Reactions  . Tramadol Other (See Comments)    Tingling all over     Social  History   Socioeconomic History  . Marital status: Married    Spouse name: None  . Number of children: Y  . Years of education: None  . Highest education level: None  Social Needs  . Financial resource strain: None  . Food insecurity - worry: None  . Food insecurity - inability: None  . Transportation needs - medical: None  . Transportation needs - non-medical: None  Occupational History  . Occupation: admin assist  Tobacco Use  . Smoking status: Former Smoker    Packs/day: 1.00    Years: 20.00    Pack years: 20.00    Last attempt to quit: 03/12/2004    Years since quitting: 12.9  . Smokeless tobacco: Never Used  Substance and Sexual Activity  . Alcohol use: Yes    Alcohol/week: 0.6 oz    Types: 1 Cans of beer per week    Comment: 1-2 times a week  . Drug use: No  . Sexual activity: Yes    Partners: Male    Birth control/protection: None  Other Topics Concern  . None  Social History Narrative   Originally from [place]. Always lived in [place]. 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 No international travel recently. Previously has been to [place] in May  1996. No mold exposure recently. No bird exposure. Does have multiple pets.    Objective:  Physical Exam: BP 124/64 (BP Location: Left Arm, Patient Position: Sitting, Cuff Size: Large)   Pulse (!) 108   Temp 98.4 F (36.9 C) (Oral)   Ht '5\' 8"'$  (1.727 m)   Wt 244 lb (110.7 kg)   SpO2 97%   BMI 37.10 kg/m   Gen: NAD, resting comfortably CV: RRR with no murmurs appreciated Pulm: NWOB, CTAB with no crackles, wheezes, or rhonchi GI: Obese, normal bowel sounds present. Soft, Nontender, Nondistended. MSK: No edema, cyanosis, or clubbing noted Skin: Warm, dry Neuro: Cranial nerves II through XII intact.  Strength 5 out of 5 in upper and lower extremities.  No gross sensory deficits noted. Psych: Normal affect and thought content  Results for orders placed or performed in visit on 02/22/17 (from the past 72 hour(s))  POCT glycosylated hemoglobin (Hb A1C)     Status: None   Collection Time: 02/22/17  9:37 AM  Result Value Ref Range   Hemoglobin A1C 6.2    Review/summary of prior imaging/labs:  MRI T-spine/C-spine 10/21/2016: Moderate foraminal narrowing at C3-4 and severe at C4-C5.  Spinal canal stenosis at C5-6  CRP 08/21/2016: 0.97 MMA 10/03/2016: 0.09 ANA 03/23/2015: Positive ANA 1:80   Assessment/Plan:  Diffuse pain Broad differential with unclear etiology.  Her imaging does give a reason for her neck and upper extremity pain, however does not explain her lower extremity pain nor does it explain her back and abdominal pain.  Her neurological exam today is normal, however she does have some neurological symptoms including a fine tremor, paresthesias, and hoarse voice.  Check TSH, CRP, ESR, ANA, anti-smooth muscle antibody, anti-dsDNA antibody, rheumatoid factor, vitamin B12, vitamin D, CBC, and CMET.  Given her distribution of neurological symptoms, we will also check a brain MRI.  Patient request to be referred back to neurology.  This referral was placed today.  Patient will follow-up  in 2 weeks after above testing has been done.  Discussed reasons to return to care sooner.  MDD (major depressive disorder), recurrent severe, without psychosis ([place]) Stable.  No SI or HI.  Defer further management to her psychiatrist.  Diabetes  mellitus type 2 in obese ([facility]) A1c improved to 6.2 today.  We will stop her metformin to see if this is contributing to her abdominal pain and diarrhea.  Encouraged lifestyle modifications.  Will need repeat A1c in about 3 months if we decide to stay off the metformin.  Abdominal pain Diarrhea Again, broad differential.  Possibly related to her metformin.  We will check lab work as noted above.  We will also stop her metformin for a couple weeks to see if this helps with her symptoms.  If still no improvement, will need further workup including lipase and possibly stool studies.  Hoarse voice Unclear etiology.  It is not likely however still possible that she could have a cranial nerve palsy contributing to this.  We will check brain MRI as outlined above.  She has been tried on PPI without significant improvement.  If continues to be an issue with no clear etiology, would consider referral to ENT for endoscopic evaluation.  Preventative healthcare Patient reports being up-to-date on her Pap smear.  She is due for diabetic healthcare maintenance-we will obtain her records from her previous PCP and update as needed.   Algis Greenhouse. Jerline Pain, MD 02/22/2017 12:41 PM

## 2017-02-22 NOTE — Assessment & Plan Note (Signed)
A1c improved to 6.2 today.  We will stop her metformin to see if this is contributing to her abdominal pain and diarrhea.  Encouraged lifestyle modifications.  Will need repeat A1c in about 3 months if we decide to stay off the metformin.

## 2017-02-22 NOTE — Assessment & Plan Note (Signed)
Stable.  No SI or HI.  Defer further management to her psychiatrist.

## 2017-02-22 NOTE — Assessment & Plan Note (Signed)
Broad differential with unclear etiology.  Her imaging does give a reason for her neck and upper extremity pain, however does not explain her lower extremity pain nor does it explain her back and abdominal pain.  Her neurological exam today is normal, however she does have some neurological symptoms including a fine tremor, paresthesias, and hoarse voice.  Check TSH, CRP, ESR, ANA, anti-smooth muscle antibody, anti-dsDNA antibody, rheumatoid factor, vitamin B12, vitamin D, CBC, and CMET.  Given her distribution of neurological symptoms, we will also check a brain MRI.  Patient request to be referred back to neurology.  This referral was placed today.  Patient will follow-up in 2 weeks after above testing has been done.  Discussed reasons to return to care sooner.

## 2017-02-22 NOTE — Patient Instructions (Signed)
We will check blood work today.  Please stop the metformin for a few weeks.  We will also send you to a neurologist and check a brain MRI.  Come back to see me in a few weeks.  Take care,  Dr Jimmey RalphParker

## 2017-02-25 LAB — ANTI-NUCLEAR AB-TITER (ANA TITER): ANA Titer 1: 1:80 {titer} — ABNORMAL HIGH

## 2017-02-25 LAB — ANTI-DNA ANTIBODY, DOUBLE-STRANDED: ds DNA Ab: <1 IU/mL

## 2017-02-25 LAB — ANTI-SMITH ANTIBODY: ENA SM Ab Ser-aCnc: <1 AI

## 2017-02-25 LAB — RHEUMATOID FACTOR: Rhuematoid fact SerPl-aCnc: <14 IU/mL (ref <14)

## 2017-02-25 LAB — ANA: Anti Nuclear Antibody(ANA): POSITIVE — AB

## 2017-02-25 LAB — ANTI-SMOOTH MUSCLE ANTIBODY, IGG: Actin (Smooth Muscle) Antibody (IGG): <20 U (ref <20)

## 2017-02-26 NOTE — Progress Notes (Signed)
Patient B12 level is low - this could explain some of the numbness/tingling but likely does not explain all of her symptoms we can start her on a B12 replacement protocol here if she wishes. I think it would likely help her symptoms some.  Her thyroid level, CRP, sedimentation rate, and blood counts are all normal.  Her electrolyte levels are normal.  Her ANA is elevated but her other immunological markers are all negative.  I think she still needs further rheumatology work pending pending the results of her brain MRI. We can refer her to a rheumatologist in town if she would prefer that, or she can follow up at [facility].  Bassettaleb M. Jimmey RalphParker, MD 02/26/2017 8:29 AM

## 2017-03-06 ENCOUNTER — Other Ambulatory Visit: Payer: Self-pay

## 2017-03-06 DIAGNOSIS — R768 Other specified abnormal immunological findings in serum: Secondary | ICD-10-CM

## 2017-03-08 ENCOUNTER — Telehealth: Payer: Self-pay | Admitting: Family Medicine

## 2017-03-08 NOTE — Telephone Encounter (Signed)
Parker patient.  

## 2017-03-08 NOTE — Telephone Encounter (Signed)
Copied from CRM [phone]. Topic: Quick Communication - See Telephone Encounter >> Mar 08, 2017 11:05 AM Cipriano BunkerLambe, Annette S wrote: CRM for notification. See Telephone encounter for:   She is calling asking about referral appts and with who. And also asking about B12 and what she needs to do.  03/08/17.

## 2017-03-08 NOTE — Telephone Encounter (Signed)
See note

## 2017-03-08 NOTE — Telephone Encounter (Signed)
Sent My-Chart message

## 2017-03-18 ENCOUNTER — Ambulatory Visit (INDEPENDENT_AMBULATORY_CARE_PROVIDER_SITE_OTHER): Payer: 59 | Admitting: Family Medicine

## 2017-03-18 ENCOUNTER — Telehealth: Payer: Self-pay | Admitting: Family Medicine

## 2017-03-18 ENCOUNTER — Encounter: Payer: Self-pay | Admitting: Family Medicine

## 2017-03-18 VITALS — BP 134/82 | HR 104 | Temp 98.7°F | Ht 68.0 in | Wt 246.0 lb

## 2017-03-18 DIAGNOSIS — E538 Deficiency of other specified B group vitamins: Secondary | ICD-10-CM

## 2017-03-18 DIAGNOSIS — E1169 Type 2 diabetes mellitus with other specified complication: Secondary | ICD-10-CM

## 2017-03-18 DIAGNOSIS — R52 Pain, unspecified: Secondary | ICD-10-CM | POA: Diagnosis not present

## 2017-03-18 DIAGNOSIS — R109 Unspecified abdominal pain: Secondary | ICD-10-CM

## 2017-03-18 DIAGNOSIS — R490 Dysphonia: Secondary | ICD-10-CM | POA: Diagnosis not present

## 2017-03-18 DIAGNOSIS — E669 Obesity, unspecified: Secondary | ICD-10-CM

## 2017-03-18 MED ORDER — CYANOCOBALAMIN 1000 MCG/ML IJ SOLN
1000.0000 ug | Freq: Once | INTRAMUSCULAR | Status: AC
  Administered 2017-03-18: 1000 ug via INTRAMUSCULAR

## 2017-03-18 MED ORDER — DULAGLUTIDE 1.5 MG/0.5ML ~~LOC~~ SOAJ: SUBCUTANEOUS | 3 refills | Status: DC

## 2017-03-18 MED ORDER — PREGABALIN 75 MG PO CAPS
75.0000 mg | ORAL_CAPSULE | Freq: Two times a day (BID) | ORAL | 3 refills | Status: DC
Start: 2017-03-18 — End: 2017-04-11

## 2017-03-18 NOTE — Telephone Encounter (Signed)
Please be advised of the note below.   Copied from CRM [phone]. Topic: Referral - Question >> Mar 18, 2017 11:22 AM Jolayne Hainesaylor, Brittany L wrote: Patient said she wants her referral to go anywhere but [facility]. She said she spoke with Dr Jimmey RalphParker today at her appt.

## 2017-03-18 NOTE — Telephone Encounter (Signed)
Copied from CRM [phone]. Topic: Quick Communication - See Telephone Encounter >> Mar 18, 2017 11:23 AM Jolayne Hainesaylor, Brittany L wrote: CRM for notification. See Telephone encounter for:   03/18/17.  Pt said the pharmacy needs a authorization for pregabalin (LYRICA) 75 MG capsule. They will be sending over a request. [facility] [place] - [place]

## 2017-03-18 NOTE — Addendum Note (Signed)
Addended by: Koleen DistanceAGNER, Alaja Goldinger M on: 03/18/2017 10:30 AM   Modules accepted: Orders

## 2017-03-18 NOTE — Patient Instructions (Signed)
We will start your B12 replacement today.  Please come back weekly for your injections.  We will also start Trulicity.  This is a once weekly injectable medication for diabetes.  Please let me know if you have any side effects.  I will check on status of your brain MRI and rheumatology referral.  Back to see me in 3-4 weeks, or sooner as needed.  Take care, Dr. Jimmey RalphParker

## 2017-03-18 NOTE — Assessment & Plan Note (Addendum)
No clear etiology.  She has a rheumatology referral pending and a brian MRI pending.  Her ANA was positive with a 1:80 titer.  She also was noted to have a low vitamin B12.  We will start vitamin B supplementation today.  Also start lyrica 75mg  bid for her neuropathic pain - this would also treat for fibromyalgia pain. She will follow-up in 3-4 weeks.

## 2017-03-18 NOTE — Progress Notes (Signed)
    Subjective:  Deborah Soto is a 52 y.o. female who presents today with a chief complaint of myalgias.   HPI:  Myalgia, established problem, worsening Several year history.  Seen about 1 month ago for this.  Had workup at that time that included positive ANA and a low B12 level.  Patient has a brain MRI as well as a rheumatology referral pending.  Other lab values including CRP, sed rate, and rheumatoid factor were negative.  Over the last few weeks her symptoms have worsened.  Still has head to toe pain.  Also with numbness in finger tips. "Band-like" pain in her abdomen. Pain is described as an "ache" but also with electrical-like sharp, shooting pains. Some blurred vision. Has not tried anything for the pain.  Vitamin B12 deficiency, new problem Found on blood work as noted above.  Has not started any supplementation.  Abdominal pain, established problem, improving Patient also seen about a month ago for this.  That time she stopped her metformin as this was thought to be possibly contributing.  Since then her symptoms have improved however not resolved.  Type 2 diabetes, established problem, stable Patient recently started on Zyprexa by her psychiatrist.  Was told that she needed to be on something for her diabetes.  They had recommended Saxenda for her.  No polyuria or polydipsia.  Hoarse voice, established problem, improving Was seen a month ago for this.  Improved over the last few weeks.  Thinks it was due to spending a lot of time on the phone at work.  ROS: Per HPI  PMH: she reports that she quit smoking about 13 years ago. She has a 20.00 pack-year smoking history. she has never used smokeless tobacco. She reports that she drinks about 0.6 oz of alcohol per week. She reports that she does not use drugs.  Objective:  Physical Exam: BP 134/82 (BP Location: Left Arm, Patient Position: Sitting, Cuff Size: Normal)   Pulse (!) 104   Temp 98.7 F (37.1 C) (Oral)   Ht 5\' 8"   (1.727 m)   Wt 246 lb (111.6 kg)   SpO2 97%   BMI 37.40 kg/m   Gen: NAD, resting comfortably CV: RRR with no murmurs appreciated Pulm: NWOB, CTAB with no crackles, wheezes, or rhonchi  Assessment/Plan:  Diffuse pain No clear etiology.  She has a rheumatology referral pending and a brian MRI pending.  Her ANA was positive with a 1:80 titer.  She also was noted to have a low vitamin B12.  We will start vitamin B supplementation today.  Also start lyrica 75mg  bid for her neuropathic pain - this would also treat for fibromyalgia pain. She will follow-up in 3-4 weeks.  Vitamin B12 deficiency Start B12 replacement today for B12 injection.  She will follow-up in 1 week for repeat injection.  Will need repeat values in 4-6 months.  Diabetes mellitus type 2 in obese [facility](HCC) Patient is concerned about her blood sugar since she recently started Zyprexa.  Will start Trulicity today.  Discussed side effects of this medication.  She is due for repeat A1c in about 2 months.  Abdominal pain Improving.  Continue with watchful waiting.  May need abdominal CT if recurs or persists.  Hoarse voice Improving.  Continue with watchful waiting.  May need referral to ENT if persists.  [name], MD 03/18/2017 9:57 AM

## 2017-03-18 NOTE — Assessment & Plan Note (Signed)
Patient is concerned about her blood sugar since she recently started Zyprexa.  Will start Trulicity today.  Discussed side effects of this medication.  She is due for repeat A1c in about 2 months.

## 2017-03-18 NOTE — Assessment & Plan Note (Signed)
Start B12 replacement today for B12 injection.  She will follow-up in 1 week for repeat injection.  Will need repeat values in 4-6 months.

## 2017-03-18 NOTE — Telephone Encounter (Signed)
Please be advised of the telephone note below regarding the medication request

## 2017-03-19 NOTE — Telephone Encounter (Signed)
PA is in process.  Waiting for insurance decision.

## 2017-03-21 ENCOUNTER — Emergency Department (HOSPITAL_COMMUNITY)
Admission: EM | Admit: 2017-03-21 | Discharge: 2017-03-21 | Disposition: A | Discharge status: Home / Self Care | Payer: 59 | Attending: Emergency Medicine | Admitting: Emergency Medicine

## 2017-03-21 ENCOUNTER — Ambulatory Visit: Payer: Self-pay | Admitting: *Deleted

## 2017-03-21 ENCOUNTER — Ambulatory Visit: Payer: Self-pay | Admitting: Family Medicine

## 2017-03-21 ENCOUNTER — Encounter (HOSPITAL_COMMUNITY): Payer: Self-pay

## 2017-03-21 DIAGNOSIS — R109 Unspecified abdominal pain: Secondary | ICD-10-CM | POA: Insufficient documentation

## 2017-03-21 DIAGNOSIS — Z5321 Procedure and treatment not carried out due to patient leaving prior to being seen by health care provider: Secondary | ICD-10-CM | POA: Insufficient documentation

## 2017-03-21 LAB — LIPASE, BLOOD: Lipase: 35 U/L (ref 11–51)

## 2017-03-21 LAB — COMPREHENSIVE METABOLIC PANEL
ALT: 63 U/L — ABNORMAL HIGH (ref 14–54)
AST: 52 U/L — ABNORMAL HIGH (ref 15–41)
Albumin: 4.4 g/dL (ref 3.5–5.0)
Alkaline Phosphatase: 70 U/L (ref 38–126)
Anion gap: 13 (ref 5–15)
BUN: 11 mg/dL (ref 6–20)
CO2: 23 mmol/L (ref 22–32)
Calcium: 9.8 mg/dL (ref 8.9–10.3)
Chloride: 102 mmol/L (ref 101–111)
Creatinine, Ser: 0.59 mg/dL (ref 0.44–1.00)
GFR calc Af Amer: >60 mL/min (ref >60)
GFR calc non Af Amer: >60 mL/min (ref >60)
Glucose, Bld: 189 mg/dL — ABNORMAL HIGH (ref 65–99)
Potassium: 4.1 mmol/L (ref 3.5–5.1)
Sodium: 138 mmol/L (ref 135–145)
Total Bilirubin: 1 mg/dL (ref 0.3–1.2)
Total Protein: 7 g/dL (ref 6.5–8.1)

## 2017-03-21 LAB — CBC
HCT: 44.9 % (ref 36.0–46.0)
Hemoglobin: 14.8 g/dL (ref 12.0–15.0)
MCH: 30.5 pg (ref 26.0–34.0)
MCHC: 33 g/dL (ref 30.0–36.0)
MCV: 92.6 fL (ref 78.0–100.0)
Platelets: 306 10*3/uL (ref 150–400)
RBC: 4.85 MIL/uL (ref 3.87–5.11)
RDW: 13.6 % (ref 11.5–15.5)
WBC: 13.1 10*3/uL — ABNORMAL HIGH (ref 4.0–10.5)

## 2017-03-21 LAB — URINALYSIS, ROUTINE W REFLEX MICROSCOPIC
Bacteria, UA: NONE SEEN
Bilirubin Urine: NEGATIVE
Glucose, UA: >=500 mg/dL — AB
Hgb urine dipstick: NEGATIVE
Ketones, ur: 5 mg/dL — AB
Leukocytes, UA: NEGATIVE
Nitrite: NEGATIVE
Protein, ur: 30 mg/dL — AB
Specific Gravity, Urine: 1.027 (ref 1.005–1.030)
pH: 5 (ref 5.0–8.0)

## 2017-03-21 LAB — I-STAT BETA HCG BLOOD, ED (MC, WL, AP ONLY): I-stat hCG, quantitative: 5.4 m[IU]/mL — ABNORMAL HIGH (ref <5)

## 2017-03-21 NOTE — Telephone Encounter (Signed)
This is yours

## 2017-03-21 NOTE — ED Notes (Signed)
Patient called x2 did not answer when called,

## 2017-03-21 NOTE — Telephone Encounter (Signed)
Patient is currently in the ED.

## 2017-03-21 NOTE — Telephone Encounter (Signed)
Patient is calling with abdominal pain that is getting worse as we are talking- patient nausea is getting worse as we are talking- she is going to go to ED- will let office know.  Reason for Disposition . [1] MILD-MODERATE pain AND [2] constant AND [3] present > 2 hours  Answer Assessment - Initial Assessment Questions 1. LOCATION: "Where does it hurt?"      All over abdomen 2. RADIATION: "Does the pain shoot anywhere else?" (e.g., chest, back)     Comes in waves in different places 3. ONSET: "When did the pain begin?" (e.g., minutes, hours or days ago)      Pain was stronger yesterday- today patient is leaving work 4. SUDDEN: "Gradual or sudden onset?"     Gradual- getting worse 5. PATTERN "Does the pain come and go, or is it constant?"    - If constant: "Is it getting better, staying the same, or worsening?"      (Note: Constant means the pain never goes away completely; most serious pain is constant and it progresses)     - If intermittent: "How long does it last?" "Do you have pain now?"     (Note: Intermittent means the pain goes away completely between bouts)     Getting worse- with nausea 6. SEVERITY: "How bad is the pain?"  (e.g., Scale 1-10; mild, moderate, or severe)   - MILD (1-3): doesn't interfere with normal activities, abdomen soft and not tender to touch    - MODERATE (4-7): interferes with normal activities or awakens from sleep, tender to touch    - SEVERE (8-10): excruciating pain, doubled over, unable to do any normal activities      7 7. RECURRENT SYMPTOM: "Have you ever had this type of abdominal pain before?" If so, ask: "When was the last time?" and "What happened that time?"      Patient has had chronic pain in abdomen- nothing like this 8. CAUSE: "What do you think is causing the abdominal pain?"     No idea 9. RELIEVING/AGGRAVATING FACTORS: "What makes it better or worse?" (e.g., movement, antacids, bowel movement)     Patient is limited in the medications she  can take- she uses Xerelto 10. OTHER SYMPTOMS: "Has there been any vomiting, diarrhea, constipation, or urine problems?"       Radiating to back left now 11. PREGNANCY: "Is there any chance you are pregnant?" "When was your last menstrual period?"       No cycles  Protocols used: ABDOMINAL PAIN - [facility] [id number]-A-AH

## 2017-03-21 NOTE — Telephone Encounter (Signed)
Noted  

## 2017-03-21 NOTE — ED Triage Notes (Signed)
Per Pt, PT is coming from work with complaints of abdominal pain significant to under bilateral breasts and lower left quadrant pain. Pt reports some nausea and vomiting, but denies diarrhea. Reports hx of abdominal pain, but the intensity feels different at this point.

## 2017-03-21 NOTE — Telephone Encounter (Signed)
PA approved.  Approval notice faxed to patient's pharmacy.  They will contact patient.

## 2017-03-21 NOTE — Telephone Encounter (Signed)
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

## 2017-03-21 NOTE — ED Notes (Signed)
RN called patient, patient did not answer.

## 2017-03-22 ENCOUNTER — Ambulatory Visit (HOSPITAL_COMMUNITY)
Admission: RE | Admit: 2017-03-22 | Discharge: 2017-03-22 | Disposition: A | Discharge status: Home / Self Care | Payer: 59 | Source: Ambulatory Visit | Attending: Family Medicine | Admitting: Family Medicine

## 2017-03-22 ENCOUNTER — Ambulatory Visit (INDEPENDENT_AMBULATORY_CARE_PROVIDER_SITE_OTHER): Payer: 59 | Admitting: Family Medicine

## 2017-03-22 ENCOUNTER — Telehealth: Payer: Self-pay | Admitting: Family Medicine

## 2017-03-22 ENCOUNTER — Encounter: Payer: Self-pay | Admitting: Family Medicine

## 2017-03-22 DIAGNOSIS — R1032 Left lower quadrant pain: Secondary | ICD-10-CM | POA: Diagnosis not present

## 2017-03-22 DIAGNOSIS — R197 Diarrhea, unspecified: Secondary | ICD-10-CM | POA: Insufficient documentation

## 2017-03-22 DIAGNOSIS — K8080 Other cholelithiasis without obstruction: Secondary | ICD-10-CM | POA: Diagnosis not present

## 2017-03-22 DIAGNOSIS — R109 Unspecified abdominal pain: Secondary | ICD-10-CM

## 2017-03-22 DIAGNOSIS — K5730 Diverticulosis of large intestine without perforation or abscess without bleeding: Secondary | ICD-10-CM | POA: Insufficient documentation

## 2017-03-22 DIAGNOSIS — K760 Fatty (change of) liver, not elsewhere classified: Secondary | ICD-10-CM | POA: Insufficient documentation

## 2017-03-22 DIAGNOSIS — R112 Nausea with vomiting, unspecified: Secondary | ICD-10-CM | POA: Insufficient documentation

## 2017-03-22 MED ORDER — IOPAMIDOL (ISOVUE-300) INJECTION 61%
INTRAVENOUS | Status: AC
  Filled 2017-03-22: qty 100

## 2017-03-22 MED ORDER — IOPAMIDOL (ISOVUE-300) INJECTION 61%
30.0000 mL | Freq: Once | INTRAVENOUS | Status: AC | PRN
  Administered 2017-03-22: 30 mL via ORAL

## 2017-03-22 MED ORDER — IOPAMIDOL (ISOVUE-300) INJECTION 61%
INTRAVENOUS | Status: AC
  Filled 2017-03-22: qty 30

## 2017-03-22 MED ORDER — IOPAMIDOL (ISOVUE-300) INJECTION 61%
100.0000 mL | Freq: Once | INTRAVENOUS | Status: AC | PRN
  Administered 2017-03-22: 100 mL via INTRAVENOUS

## 2017-03-22 NOTE — Progress Notes (Signed)
Subjective:  Deborah Soto is a 52 y.o. female who presents today with a chief complaint of abdominal pain.   HPI:  Abdominal Pain, acute issue Symptoms started a few days ago.  Have been waxing and waning since that time however have significantly worsened over the last day.  She has had some associated nausea with vomiting.  Also a few episodes of diarrhea.  Pain mostly located in her left lower quadrant as well as her right upper quadrant.  However notes a band around her upper abdomen that is also tender.  She has not been able to eat anything since yesterday.  No fevers or chills.  No dysuria.  No hematuria.  No melena or hematochezia.  She has not tried any medications.  She initially went to the ED for evaluation yesterday however ended up leaving because the wait was too long.  She has history of kidney stones and diverticulitis however reports that her current symptoms are not consistent with either of these.  She has not noticed anything that makes the pain better or worse.  ROS: Per HPI  PMH: She reports that she quit smoking about 13 years ago. She has a 20.00 pack-year smoking history. she has never used smokeless tobacco. She reports that she drinks about 0.6 oz of alcohol per week. She reports that she does not use drugs.  Objective:  Physical Exam: BP (!) 142/88 (BP Location: Left Arm, Patient Position: Sitting, Cuff Size: Large)   Pulse (!) 125   Temp 99 F (37.2 C) (Oral)   Ht 5\' 8"  (1.727 m)   Wt 244 lb 9.6 oz (110.9 kg)   SpO2 97%   BMI 37.19 kg/m   Gen: NAD, resting comfortably CV: Tachycardic.  Regular.  No murmurs appreciated Pulm: NWOB, CTAB with no crackles, wheezes, or rhonchi GI: Obese, bowel sounds present, soft, tender to palpation at right lower quadrant, left lower quadrant, and epigastric area.  Mild rebound tenderness in right lower quadrant.  No guarding.  Murphy sign negative.   Results for orders placed or performed during the hospital encounter  of 03/21/17 (from the past 24 hour(s))  Lipase, blood     Status: None   Collection Time: 03/21/17 12:10 PM  Result Value Ref Range   Lipase 35 11 - 51 U/L  Comprehensive metabolic panel     Status: Abnormal   Collection Time: 03/21/17 12:10 PM  Result Value Ref Range   Sodium 138 135 - 145 mmol/L   Potassium 4.1 3.5 - 5.1 mmol/L   Chloride 102 101 - 111 mmol/L   CO2 23 22 - 32 mmol/L   Glucose, Bld 189 (H) 65 - 99 mg/dL   BUN 11 6 - 20 mg/dL   Creatinine, Ser 1.61 0.44 - 1.00 mg/dL   Calcium 9.8 8.9 - 09.6 mg/dL   Total Protein 7.0 6.5 - 8.1 g/dL   Albumin 4.4 3.5 - 5.0 g/dL   AST 52 (H) 15 - 41 U/L   ALT 63 (H) 14 - 54 U/L   Alkaline Phosphatase 70 38 - 126 U/L   Total Bilirubin 1.0 0.3 - 1.2 mg/dL   GFR calc non Af Amer >60 >60 mL/min   GFR calc Af Amer >60 >60 mL/min   Anion gap 13 5 - 15  CBC     Status: Abnormal   Collection Time: 03/21/17 12:10 PM  Result Value Ref Range   WBC 13.1 (H) 4.0 - 10.5 K/uL   RBC 4.85 3.87 -  5.11 MIL/uL   Hemoglobin 14.8 12.0 - 15.0 g/dL   HCT 96.044.9 45.436.0 - 09.846.0 %   MCV 92.6 78.0 - 100.0 fL   MCH 30.5 26.0 - 34.0 pg   MCHC 33.0 30.0 - 36.0 g/dL   RDW 11.913.6 14.711.5 - 82.915.5 %   Platelets 306 150 - 400 K/uL  Urinalysis, Routine w reflex microscopic     Status: Abnormal   Collection Time: 03/21/17 12:15 PM  Result Value Ref Range   Color, Urine YELLOW YELLOW   APPearance CLEAR CLEAR   Specific Gravity, Urine 1.027 1.005 - 1.030   pH 5.0 5.0 - 8.0   Glucose, UA >=500 (A) NEGATIVE mg/dL   Hgb urine dipstick NEGATIVE NEGATIVE   Bilirubin Urine NEGATIVE NEGATIVE   Ketones, ur 5 (A) NEGATIVE mg/dL   Protein, ur 30 (A) NEGATIVE mg/dL   Nitrite NEGATIVE NEGATIVE   Leukocytes, UA NEGATIVE NEGATIVE   RBC / HPF 0-5 0 - 5 RBC/hpf   WBC, UA 0-5 0 - 5 WBC/hpf   Bacteria, UA NONE SEEN NONE SEEN   Squamous Epithelial / LPF 0-5 (A) NONE SEEN   Mucus PRESENT   I-Stat beta hCG blood, ED     Status: Abnormal   Collection Time: 03/21/17 12:28 PM  Result  Value Ref Range   I-stat hCG, quantitative 5.4 (H) <5 mIU/mL   Comment 3             Assessment/Plan:  Abdominal pain Given her left lower quadrant pain as well as diarrhea, this is most consistent with mild diverticulitis.  She had limited workup performed in the ED prior to leaving which revealed elevated WBC, normal lipase, and essentially normal urinalysis.  She also had some mild elevation in her transaminases, which is chronic for her.  Discussed empiric treatment for diverticulitis with Cipro/Flagyl versus abdominal CT for definitive diagnosis.  Patient deferred antibiotic treatment and wished to have abdominal CT done.  This was ordered.  Advised patient to stay well-hydrated as she has some signs of dehydration including ketones in her urinalysis as well as tachycardia on exam. We will await her abdominal CT. She has follow up scheduled in 3 weeks. Discussed reasons to seek emergent care.   Time Spent: I spent 25 minutes face-to-face with the patient, with more than half spent on counseling for diagnostic possibilities, treatment options, and coordinating her care.  [name], MD 03/22/2017 10:55 AM

## 2017-03-22 NOTE — Telephone Encounter (Signed)
Deborah Soto from ultrasound calling to make sure CT of abdomen results were showing in pt's chart. Verified with pt that results were available.

## 2017-03-23 NOTE — Progress Notes (Signed)
Good news-patient's CT was negative for appendicitis and diverticulitis.  No signs of bowel inflammation or irritation. She does have a gallstone in her gallbladder that may be causing some of her symptoms.  We probably need to get this evaluated with an ultrasound of her symptoms are persisting or worsening.

## 2017-03-25 ENCOUNTER — Ambulatory Visit (HOSPITAL_COMMUNITY)
Admission: RE | Admit: 2017-03-25 | Discharge: 2017-03-25 | Disposition: A | Discharge status: Home / Self Care | Payer: 59 | Source: Ambulatory Visit | Attending: Family Medicine | Admitting: Family Medicine

## 2017-03-25 DIAGNOSIS — M791 Myalgia, unspecified site: Secondary | ICD-10-CM | POA: Diagnosis not present

## 2017-03-26 NOTE — Progress Notes (Signed)
Good news-MRI is negative for MS or any other abnormality. The symptoms she is having is not coming from a neurological problem. She should keep her appointment with rheumatology for further management.  [name], MD 03/26/2017 8:37 AM

## 2017-03-29 ENCOUNTER — Other Ambulatory Visit: Payer: Self-pay

## 2017-03-29 MED ORDER — ATORVASTATIN CALCIUM 10 MG PO TABS: 10.0000 mg | ORAL_TABLET | Freq: Every day | ORAL | 1 refills | Status: DC

## 2017-04-04 ENCOUNTER — Telehealth: Payer: Self-pay | Admitting: Family Medicine

## 2017-04-04 NOTE — Telephone Encounter (Signed)
Copied from CRM [id number]. Topic: Referral - Status >> Apr 03, 2017  1:08 PM Laural BenesJohnson, Louisianahiquita C wrote: Reason for CRM: pt called in to check the status of her Rheumatology referral   [phone]   [facility] been [facility], they will review it then make determination. I have no update until the get a chance to review referral.

## 2017-04-10 ENCOUNTER — Ambulatory Visit: Payer: 59 | Admitting: Family Medicine

## 2017-04-10 ENCOUNTER — Other Ambulatory Visit: Payer: Self-pay

## 2017-04-11 ENCOUNTER — Encounter: Payer: Self-pay | Admitting: Family Medicine

## 2017-04-11 ENCOUNTER — Ambulatory Visit (INDEPENDENT_AMBULATORY_CARE_PROVIDER_SITE_OTHER): Payer: 59 | Admitting: Family Medicine

## 2017-04-11 DIAGNOSIS — E1169 Type 2 diabetes mellitus with other specified complication: Secondary | ICD-10-CM

## 2017-04-11 DIAGNOSIS — E669 Obesity, unspecified: Secondary | ICD-10-CM

## 2017-04-11 DIAGNOSIS — R52 Pain, unspecified: Secondary | ICD-10-CM | POA: Diagnosis not present

## 2017-04-11 MED ORDER — PREGABALIN 75 MG PO CAPS: 150.0000 mg | ORAL_CAPSULE | Freq: Two times a day (BID) | ORAL | 3 refills | Status: DC

## 2017-04-11 MED ORDER — CYCLOBENZAPRINE HCL 10 MG PO TABS: 10.0000 mg | ORAL_TABLET | Freq: Three times a day (TID) | ORAL | 0 refills | Status: DC | PRN

## 2017-04-11 MED ORDER — METFORMIN HCL ER 500 MG PO TB24: 500.0000 mg | ORAL_TABLET | Freq: Every day | ORAL | 1 refills | Status: DC

## 2017-04-11 NOTE — Progress Notes (Signed)
Subjective:  Deborah Soto is a 52 y.o. female who presents today with a chief complaint of type 2 diabetes hyperglycemia.   HPI:  T2DM/Hyperglycemia, established problem, worsening Patient seen a few weeks ago for this.  At that time her A1c was 6.2.  She was also having GI symptoms including diarrhea.  At that point, was decided to stop her metformin.  She was started on Trulicity a few weeks ago as she was recently started on Zyprexa, and was concerned about the metabolic side effects of this medication.  Over the past few weeks, her sugars have been severely elevated.  She has had a few into the 400s.  Lowest in the upper 180s.  She has had some polyuria and polydipsia.  No hypoglycemic symptoms.  She is not sure if her GI symptoms have improved or not.  Diffuse pain, established problem, stable Patient also seen a few weeks ago for this.  At that time she was having severe abdominal pain, and we decided to get an abdominal CT for further evaluation.  This was performed and showed no acute intra-abdominal abnormalities however did show some cholelithiasis.  Denies any right upper quadrant pain after eating.  No nausea or vomiting.  Since her last visit, her pain is relatively the same.  We initially started Lyrica 75 mg twice daily at her last visit and patient is not sure if this is helped.  Patient is very frustrated about her pain.  Reports that it limits her ability to perform her work duties and activities of daily living.  She has an appointment with rheumatology in a couple weeks.  ROS: Per HPI  PMH: She reports that she quit smoking about 13 years ago. She has a 20.00 pack-year smoking history. she has never used smokeless tobacco. She reports that she drinks about 0.6 oz of alcohol per week. She reports that she does not use drugs.   Objective:  Physical Exam: BP 132/80 (BP Location: Right Arm, Patient Position: Sitting, Cuff Size: Large)   Pulse (!) 125   Temp 98.8 F (37.1  C) (Oral)   Ht 5\' 8"  (1.727 m)   Wt 246 lb 3.2 oz (111.7 kg)   SpO2 95%   BMI 37.43 kg/m   Gen: NAD, resting comfortably CV: RRR with no murmurs appreciated Pulm: NWOB, CTAB with no crackles, wheezes, or rhonchi GI: No deformities.  Normal bowel sounds present.  Mild tenderness to palpation in the right upper quadrant right flank.  No rebound or guarding.  No distention. MSK:  -Back: No spinous process tenderness.  She has some paraspinal muscular tenderness.  Full range of motion.  No other deformities.  Assessment/Plan:  Diabetes mellitus type 2 in obese (HCC) Continue Trulicity.  Will also start low-dose metformin as her GI symptoms have not changed since coming off this medication.  Start metformin 500 mg daily.  Instructed patient to increase to 500 mg twice daily if tolerating well within the next couple of weeks.  She will follow-up with me in 3-4 weeks.  Diffuse pain Workup thus far has been relatively unrevealing.  She did have a positive ANA and has a rheumatology referral pending.  Her brain MRI was normal.  Per her previous rheumatologist, her symptoms were thought to be most likely secondary to myofascial pain/fibromyalgia.  We will increase her dose of Lyrica to 150 mg twice daily.  I will also start Flexeril given her paraspinal back tenderness.  She will follow-up with me in 3-4  weeks.  Would consider short course of prednisone to see if this helps with her symptoms.  Otherwise, we have exhausted nearly all of her options for pain control.  She has not a candidate for NSAIDs due to her anticoagulation.  If symptoms continue to be poorly controlled, would consider referral to pain clinic for symptomatic management.   [name], MD 04/11/2017 10:14 AM

## 2017-04-11 NOTE — Assessment & Plan Note (Signed)
Continue Trulicity.  Will also start low-dose metformin as her GI symptoms have not changed since coming off this medication.  Start metformin 500 mg daily.  Instructed patient to increase to 500 mg twice daily if tolerating well within the next couple of weeks.  She will follow-up with me in 3-4 weeks.

## 2017-04-11 NOTE — Assessment & Plan Note (Signed)
Workup thus far has been relatively unrevealing.  She did have a positive ANA and has a rheumatology referral pending.  Her brain MRI was normal.  Per her previous rheumatologist, her symptoms were thought to be most likely secondary to myofascial pain/fibromyalgia.  We will increase her dose of Lyrica to 150 mg twice daily.  I will also start Flexeril given her paraspinal back tenderness.  She will follow-up with me in 3-4 weeks.  Would consider short course of prednisone to see if this helps with her symptoms.  Otherwise, we have exhausted nearly all of her options for pain control.  She has not a candidate for NSAIDs due to her anticoagulation.  If symptoms continue to be poorly controlled, would consider referral to pain clinic for symptomatic management.

## 2017-04-11 NOTE — Patient Instructions (Addendum)
We will restart your metformin.  Please take 500 mg daily.  If you feel well with this dose for a couple weeks, you can increase to 500 mg twice daily.  Please let me know if you continue to have very high blood sugars.  You had some gallstones on your abdominal CT-I do not think these are causing your symptoms.  If you start to have any severe right upper quadrant abdominal pain after eating, please let us know.  Unfortunately, we are running out of options to help control your pain.  It may be reasonable to do a course of prednisone at some point in the near future.  I would like to increase your dose of Lyrica.  Please increase to 1 pill in the morning and 2 pills at night for a week.  You can then increase to 2 pills in the morning and 2 pills at night.  I would like for you to come back and see me in 3-4 weeks.  Take care, Dr. Jimmey RalphParker

## 2017-04-16 MED ORDER — PREGABALIN 75 MG PO CAPS: 150.0000 mg | ORAL_CAPSULE | Freq: Two times a day (BID) | ORAL | 3 refills | Status: DC

## 2017-04-16 NOTE — Telephone Encounter (Signed)
Sent in lyrica refill.  Ok for her to increase metformin as tolerated.  [name], MD 04/16/2017 11:11 AM

## 2017-04-19 ENCOUNTER — Telehealth: Payer: Self-pay

## 2017-04-19 ENCOUNTER — Other Ambulatory Visit (HOSPITAL_COMMUNITY): Payer: 59 | Attending: Psychiatry | Admitting: Emergency Medicine

## 2017-04-19 DIAGNOSIS — F332 Major depressive disorder, recurrent severe without psychotic features: Secondary | ICD-10-CM | POA: Diagnosis not present

## 2017-04-19 NOTE — Telephone Encounter (Signed)
[facility] is requesting call back for follow up questions [phone] ref # [id number]

## 2017-04-19 NOTE — Telephone Encounter (Signed)
Patient's Lyrica PA has been approved.

## 2017-04-19 NOTE — Telephone Encounter (Signed)
PA for Lyrica has been started through [employer] My Meds.  Waiting for insurance decision.

## 2017-04-19 NOTE — Telephone Encounter (Signed)
I have answered clinical questions via [employer].  Will call [facility] if I have an opportunity when finished seeing patients.

## 2017-04-19 NOTE — Progress Notes (Signed)
Pt [facility] to [facility] for cortical mapping and motor threshold determination for Repetitive Transcranial Magnetic Stimulation treatment for Major Depressive Disorder. Pt completed a PHQ-9 with a score of 20 (severe depression). Pt also completed a Beck's Depression Inventory with a score of 33 (severe depression). Prior to procedure, pt signed an informed consent agreement for TMS treatment. Pt arrived to tx session with husband. Pt's treatment area was found by applying single pulses to her left motor cortex, hunting along the anterior/posterior plane and along the superior oblique angle until the best motor response was elicited from the pt's right thumb. The best response was observed at 6.5 cm A/P and 33 degrees SOA, with a coil angle of 0 degrees. Pt's motor threshold was calculated using the [employer]'s proprietary MT Assist algorithm, which produced a calculated motor threshold of 1.2 SMT. Per these findings, pt's treatment parameters are as follows: A/P -- 13 cm, SOA -- 33 degrees, Coil Angle -- 0 degrees, Motor Threshold -- 0.75 SMT. With these parameters, the pt will receive 36 sessions of TMS according to the following protocol: 3000 pulses per session, with stimulation in bursts of pulses lasting 4 seconds at a frequency of 10 Hz, separated by 26 seconds of rest. After determining pt's tx parameters, coil was moved to the treatment location, and the first burst of pulses was applied at a reduced power of 80% MT. Pt reported no complaints, and stated that the stimulation was tolerable. Upon completion of mapping, pt completed a few treatment intervals for observation of side effects. Pt tolerated tx well. Pt and husband departed from clinic without issue.

## 2017-04-22 ENCOUNTER — Other Ambulatory Visit (INDEPENDENT_AMBULATORY_CARE_PROVIDER_SITE_OTHER): Payer: 59 | Admitting: Emergency Medicine

## 2017-04-22 ENCOUNTER — Encounter (HOSPITAL_COMMUNITY): Payer: Self-pay | Admitting: Emergency Medicine

## 2017-04-22 DIAGNOSIS — F332 Major depressive disorder, recurrent severe without psychotic features: Secondary | ICD-10-CM

## 2017-04-22 NOTE — Progress Notes (Signed)
[facility] for Repetitive Transcranial Magnetic Stimulation treatment for severe episode of recurrent major depressive disorder, without psychotic features. Patient presented with appropriate affect, level mood and denied any suicidal or homicidal ideations. Patient denies any other current symptoms and remains optimistic with continued TMS treatment. Patient reported no change in alcohol/substane use, caffeine consumption, sleep pattern or metal implant status since previous treatment. Ptpresented to tx session with pleasant and timid affect. Pt had a good weekend and did not expereince any headaches the past couple of days. Pt watched HGTV during tx session and talked briefly about her daughters.Power levelwas at 120%for the duration of the tx.Patiient reported nocomplaints anddiscomfort during tx.Patientand motherdeparted post-treatment with no majorreported concerns

## 2017-04-23 ENCOUNTER — Other Ambulatory Visit: Payer: Self-pay | Admitting: Physician Assistant

## 2017-04-23 ENCOUNTER — Other Ambulatory Visit (INDEPENDENT_AMBULATORY_CARE_PROVIDER_SITE_OTHER): Payer: 59 | Admitting: Emergency Medicine

## 2017-04-23 DIAGNOSIS — F332 Major depressive disorder, recurrent severe without psychotic features: Secondary | ICD-10-CM | POA: Diagnosis not present

## 2017-04-23 NOTE — Progress Notes (Signed)
[facility] for Repetitive Transcranial Magnetic Stimulation treatment for severe episode of recurrent major depressive disorder, without psychotic features. Patient presented with appropriate affect, level mood and denied any suicidal or homicidal ideations. Patient denies any other current symptoms and remains optimistic with continued TMS treatment. Patient reported no change in alcohol/substane use, caffeine consumption, sleep pattern or metal implant status since previous treatment. Ptpresented to tx session with pleasant affect. Pt experienced fatigue yesterday evening. Power levelwas at 120%for the duration of the tx.Patiient reported nocomplaints anddiscomfort during tx.Patientand motherdeparted post-treatment with no majorreported concerns

## 2017-04-24 ENCOUNTER — Other Ambulatory Visit: Payer: Self-pay

## 2017-04-24 ENCOUNTER — Other Ambulatory Visit (INDEPENDENT_AMBULATORY_CARE_PROVIDER_SITE_OTHER): Payer: 59 | Admitting: Emergency Medicine

## 2017-04-24 DIAGNOSIS — F332 Major depressive disorder, recurrent severe without psychotic features: Secondary | ICD-10-CM | POA: Diagnosis not present

## 2017-04-24 MED ORDER — METFORMIN HCL ER 500 MG PO TB24: 500.0000 mg | ORAL_TABLET | Freq: Two times a day (BID) | ORAL | 1 refills | Status: DC

## 2017-04-24 NOTE — Progress Notes (Signed)
[facility] for Repetitive Transcranial Magnetic Stimulation treatment for severe episode of recurrent major depressive disorder, without psychotic features. Patient presented with appropriate affect, level mood and denied any suicidal or homicidal ideations. Patient denies any other current symptoms and remains optimistic with continued TMS treatment. Patient reported no change in alcohol/substane use, caffeine consumption, sleep pattern or metal implant status since previous treatment. Ptpresented to tx session with pleasantaffect. Pt has a headache early this morning. She fell asleep during tx session Scientific laboratory technician- writer reinforced that sleeping diminishes the effectiveness during tc  Power levelwas at 120%for the duration of the tx.Patiient reported nocomplaints anddiscomfort during tx.Patientand motherdeparted post-treatment with no majorreported concerns

## 2017-04-25 ENCOUNTER — Other Ambulatory Visit (INDEPENDENT_AMBULATORY_CARE_PROVIDER_SITE_OTHER): Payer: 59 | Admitting: Emergency Medicine

## 2017-04-25 DIAGNOSIS — F332 Major depressive disorder, recurrent severe without psychotic features: Secondary | ICD-10-CM

## 2017-04-25 NOTE — Progress Notes (Signed)
[facility] for Repetitive Transcranial Magnetic Stimulation treatment for severe episode of recurrent major depressive disorder, without psychotic features. Patient presented with appropriate affect, level mood and denied any suicidal or homicidal ideations. Patient denies any other current symptoms and remains optimistic with continued TMS treatment. Patient reported no change in alcohol/substane use, caffeine consumption, sleep pattern or metal implant status since previous treatment. Ptpresented to tx session with pleasantaffect. Pt expressed she is not sleeping well at night. She was tired during tx session. Pt watched HGTV to stay awake.Power levelwas at 120%for the duration of the tx.Patiient reported nocomplaints anddiscomfort during tx.Patientand motherdeparted post-treatment with no majorreported concerns

## 2017-04-26 ENCOUNTER — Other Ambulatory Visit (INDEPENDENT_AMBULATORY_CARE_PROVIDER_SITE_OTHER): Payer: 59 | Admitting: Emergency Medicine

## 2017-04-26 DIAGNOSIS — F332 Major depressive disorder, recurrent severe without psychotic features: Secondary | ICD-10-CM | POA: Diagnosis not present

## 2017-04-26 NOTE — Progress Notes (Signed)
[facility] for Repetitive Transcranial Magnetic Stimulation treatment for severe episode of recurrent major depressive disorder, without psychotic features. Patient presented with appropriate affect, level mood and denied any suicidal or homicidal ideations. Patient denies any other current symptoms and remains optimistic with continued TMS treatment. Patient reported no change in alcohol/substane use, caffeine consumption, sleep pattern or metal implant status since previous treatment. Ptpresented to tx session with pleasantaffect. Pt watched HGTV during tx session Pt shared she had a nice evening with her husband. .Power levelwas at 120%for the duration of the tx.Patiient reported nocomplaints anddiscomfort during tx.Patientand motherdeparted post-treatment with no majorreported concerns

## 2017-04-29 ENCOUNTER — Encounter (HOSPITAL_COMMUNITY): Payer: Self-pay

## 2017-04-30 ENCOUNTER — Other Ambulatory Visit (INDEPENDENT_AMBULATORY_CARE_PROVIDER_SITE_OTHER): Payer: 59 | Admitting: Emergency Medicine

## 2017-04-30 DIAGNOSIS — F332 Major depressive disorder, recurrent severe without psychotic features: Secondary | ICD-10-CM | POA: Diagnosis not present

## 2017-04-30 NOTE — Progress Notes (Signed)
[facility] for Repetitive Transcranial Magnetic Stimulation treatment for severe episode of recurrent major depressive disorder, without psychotic features. Patient presented with appropriate affect, level mood and denied any suicidal or homicidal ideations. Patient denies any other current symptoms and remains optimistic with continued TMS treatment. Patient reported no change in alcohol/substane use, caffeine consumption, sleep pattern or metal implant status since previous treatment. Ptpresented to tx session with pleasantaffect. Pt was talkative during tx session. She had a good evening with her kids. She is taking care of her oldest daughter who recently had a concussion from playing volleyball. She also shared that she got her nails and toes done with her youngest daughter. Yesterday was a good day for her.  Power levelwas at 120%for the duration of the tx.Patiient reported nocomplaints anddiscomfort during tx.Patientand motherdeparted post-treatment with no majorreported concerns

## 2017-05-01 ENCOUNTER — Other Ambulatory Visit (INDEPENDENT_AMBULATORY_CARE_PROVIDER_SITE_OTHER): Payer: 59 | Admitting: Emergency Medicine

## 2017-05-01 DIAGNOSIS — F332 Major depressive disorder, recurrent severe without psychotic features: Secondary | ICD-10-CM | POA: Diagnosis not present

## 2017-05-01 NOTE — Progress Notes (Signed)
[facility] for Repetitive Transcranial Magnetic Stimulation treatment for severe episode of recurrent major depressive disorder, without psychotic features. Patient presented with appropriate affect, level mood and denied any suicidal or homicidal ideations. Patient denies any other current symptoms and remains optimistic with continued TMS treatment. Patient reported no change in alcohol/substane use, caffeine consumption, sleep pattern or metal implant status since previous treatment. Ptpresented to tx session with pleasantaffect.Pt has been having stomach and back pains early this morning. She also vomited before coming into tx. She has been feeling nauseous. Pt watched HGTV during tx session. Power levelwas at 120%for the duration of the tx.Patiient reported nocomplaints anddiscomfort during tx.Patientand motherdeparted post-treatment with no majorreported concerns

## 2017-05-02 ENCOUNTER — Other Ambulatory Visit (INDEPENDENT_AMBULATORY_CARE_PROVIDER_SITE_OTHER): Payer: 59 | Admitting: Emergency Medicine

## 2017-05-02 DIAGNOSIS — F332 Major depressive disorder, recurrent severe without psychotic features: Secondary | ICD-10-CM | POA: Diagnosis not present

## 2017-05-02 NOTE — Progress Notes (Signed)
Patient [facility] for Repetitive Transcranial Magnetic Stimulation treatment for severe episode of recurrent major depressive disorder, without psychotic features. Patient presented with appropriate affect, level mood and denied any suicidal or homicidal ideations. Patient denies any other current symptoms and remains optimistic with continued TMS treatment. Patient reported no change in alcohol/substane use, caffeine consumption, sleep pattern or metal implant status since previous treatment. Ptpresented to tx session with pleasantaffect.Ptwatched HGTV during tx session. She mentioned [place] for her daughter's volleyball tournament. She is excited to support and spend time with her family. Power levelwas at 120%for the duration of the tx.Patiient reported nocomplaints anddiscomfort during tx.Patientand motherdeparted post-treatment with no majorreported concerns

## 2017-05-03 ENCOUNTER — Other Ambulatory Visit (INDEPENDENT_AMBULATORY_CARE_PROVIDER_SITE_OTHER): Payer: 59 | Admitting: Emergency Medicine

## 2017-05-03 DIAGNOSIS — F332 Major depressive disorder, recurrent severe without psychotic features: Secondary | ICD-10-CM

## 2017-05-03 NOTE — Progress Notes (Signed)
[facility] for Repetitive Transcranial Magnetic Stimulation treatment for severe episode of recurrent major depressive disorder, without psychotic features. Patient presented with appropriate affect, level mood and denied any suicidal or homicidal ideations. Patient denies any other current symptoms and remains optimistic with continued TMS treatment. Patient reported no change in alcohol/substane use, caffeine consumption, sleep pattern or metal implant status since previous treatment. Ptpresented to tx session with pleasantaffect.Ptwatched HGTV during tx session. She shared that she [place] soon as she leaves tx. Power levelwas at 120%for the duration of the tx.Patiient reported nocomplaints anddiscomfort during tx.Patientand motherdeparted post-treatment with no majorreported concerns

## 2017-05-06 ENCOUNTER — Other Ambulatory Visit (INDEPENDENT_AMBULATORY_CARE_PROVIDER_SITE_OTHER): Payer: 59 | Admitting: Emergency Medicine

## 2017-05-06 DIAGNOSIS — F332 Major depressive disorder, recurrent severe without psychotic features: Secondary | ICD-10-CM

## 2017-05-06 NOTE — Progress Notes (Signed)
[facility] for Repetitive Transcranial Magnetic Stimulation treatment for severe episode of recurrent major depressive disorder, without psychotic features. Patient presented with appropriate affect, level mood and denied any suicidal or homicidal ideations. Patient denies any other current symptoms and remains optimistic with continued TMS treatment. Patient reported no change in alcohol/substane use, caffeine consumption, sleep pattern or metal implant status since previous treatment. Ptpresented to tx session with pleasantaffect.Ptis extremely tired because she came back in town at 1 am from her daughter's volleyball tournament. She shared that she had a good time with her family even though her daughter's team did not win. She shared that she had to deal with confusion at the hotel booking and dinner reservations, which caused her frustration and anxiety. Power levelwas at 120%for the duration of the tx.Patiient reported nocomplaints anddiscomfort during tx.Patientand motherdeparted post-treatment with no majorreported concerns.

## 2017-05-07 ENCOUNTER — Other Ambulatory Visit (INDEPENDENT_AMBULATORY_CARE_PROVIDER_SITE_OTHER): Payer: 59 | Admitting: Emergency Medicine

## 2017-05-07 DIAGNOSIS — F332 Major depressive disorder, recurrent severe without psychotic features: Secondary | ICD-10-CM

## 2017-05-07 NOTE — Progress Notes (Signed)
[facility] for Repetitive Transcranial Magnetic Stimulation treatment for severe episode of recurrent major depressive disorder, without psychotic features. Patient presented with appropriate affect, level mood and denied any suicidal or homicidal ideations. Patient denies any other current symptoms and remains optimistic with continued TMS treatment. Patient reported no change in alcohol/substane use, caffeine consumption, sleep pattern or metal implant status since previous treatment. Ptpresented to tx session with pleasantaffect.Ptexpresed she is feeling back and abdominal pain. On a pain scale of 1-10, she reported pain as a 4. She watched HGTV during tx session.  Power levelwas at 120%for the duration of the tx.Patiient reported nocomplaints anddiscomfort during tx.Patientand motherdeparted post-treatment with no majorreported concerns.

## 2017-05-08 ENCOUNTER — Other Ambulatory Visit (INDEPENDENT_AMBULATORY_CARE_PROVIDER_SITE_OTHER): Payer: 59 | Admitting: Emergency Medicine

## 2017-05-08 DIAGNOSIS — F332 Major depressive disorder, recurrent severe without psychotic features: Secondary | ICD-10-CM | POA: Diagnosis not present

## 2017-05-08 NOTE — Progress Notes (Signed)
[facility] for Repetitive Transcranial Magnetic Stimulation treatment for severe episode of recurrent major depressive disorder, without psychotic features. Patient presented with appropriate affect, level mood and denied any suicidal or homicidal ideations. Patient denies any other current symptoms and remains optimistic with continued TMS treatment. Patient reported no change in alcohol/substane use, caffeine consumption, sleep pattern or metal implant status since previous treatment. Ptpresented to tx session with pleasantaffect.Ptexpressed that she is catching up on sleep since her weekend trip. She [place] daughter[place] tournament. She shared that TMS is helping her stay motivated and present at her daughter's games. She watched HGTV during tx session. She briefly fell asleep. Writer woke her up and provided reminder that sleeping declines effectiveness of tx. She was receptive.  Power levelwas at 120%for the duration of the tx.Patiient reported nocomplaints anddiscomfort during tx.Patientand motherdeparted post-treatment with no majorreported concerns.

## 2017-05-09 ENCOUNTER — Other Ambulatory Visit (INDEPENDENT_AMBULATORY_CARE_PROVIDER_SITE_OTHER): Payer: 59 | Admitting: Emergency Medicine

## 2017-05-09 DIAGNOSIS — F332 Major depressive disorder, recurrent severe without psychotic features: Secondary | ICD-10-CM

## 2017-05-09 NOTE — Progress Notes (Signed)
[facility] for Repetitive Transcranial Magnetic Stimulation treatment for severe episode of recurrent major depressive disorder, without psychotic features. Patient presented with appropriate affect, level mood and denied any suicidal or homicidal ideations. Patient denies any other current symptoms and remains optimistic with continued TMS treatment. Patient reported no change in alcohol/substane use, caffeine consumption, sleep pattern or metal implant status since previous treatment. Ptpresented to tx session with pleasantaffect.Ptreported that she slept well last night. She is not fatigued as she usually is coming into tx.She watched HGTV during tx session. Power levelwas at 120%for the duration of the tx.Patiient reported nocomplaints anddiscomfort during tx.Patientand motherdeparted post-treatment with no majorreported concerns.

## 2017-05-10 ENCOUNTER — Other Ambulatory Visit (HOSPITAL_COMMUNITY): Payer: 59 | Attending: Psychiatry | Admitting: Emergency Medicine

## 2017-05-10 DIAGNOSIS — F332 Major depressive disorder, recurrent severe without psychotic features: Secondary | ICD-10-CM | POA: Diagnosis not present

## 2017-05-10 NOTE — Progress Notes (Signed)
[facility] for Repetitive Transcranial Magnetic Stimulation treatment for severe episode of recurrent major depressive disorder, without psychotic features. Patient presented with appropriate affect, level mood and denied any suicidal or homicidal ideations. Patient denies any other current symptoms and remains optimistic with continued TMS treatment. Patient reported no change in alcohol/substane use, caffeine consumption, sleep pattern or metal implant status since previous treatment. Ptpresented to tx session with pleasantaffect.Ptreported body aches this morning. She expressed that taking pain relieve medications does not help. She has been dealing with body aches and stomach pains for the past 3 years. Pt watched HGTV during tx session. Power levelwas at 120%for the duration of the tx.Patiient reported nocomplaints anddiscomfort during tx.Patientand motherdeparted post-treatment with no majorreported concerns.

## 2017-05-13 ENCOUNTER — Other Ambulatory Visit (INDEPENDENT_AMBULATORY_CARE_PROVIDER_SITE_OTHER): Payer: 59 | Admitting: Emergency Medicine

## 2017-05-13 DIAGNOSIS — F332 Major depressive disorder, recurrent severe without psychotic features: Secondary | ICD-10-CM

## 2017-05-13 NOTE — Progress Notes (Signed)
[facility] for Repetitive Transcranial Magnetic Stimulation treatment for severe episode of recurrent major depressive disorder, without psychotic features. Patient presented with appropriate affect, level mood and denied any suicidal or homicidal ideations. Patient denies any other current symptoms and remains optimistic with continued TMS treatment. Patient reported no change in alcohol/substane use, caffeine consumption, sleep pattern or metal implant status since previous treatment. Ptpresented to tx session with pleasantaffect.Ptreported body aches this morning. She is extremely tired because she had been traveling this past weekend for daughter's volleyball tournament. She watched TV during tx session. She dozed of twice during the duration of tx. Writer woke her up and reminded her sleeping declines the effectiveness of TMS.Power levelwas at 120%for the duration of the tx.Patiient reported nocomplaints anddiscomfort during tx.Patientand motherdeparted post-treatment with no majorreported concerns.

## 2017-05-14 ENCOUNTER — Other Ambulatory Visit (INDEPENDENT_AMBULATORY_CARE_PROVIDER_SITE_OTHER): Payer: 59 | Admitting: Emergency Medicine

## 2017-05-14 ENCOUNTER — Other Ambulatory Visit: Payer: Self-pay | Admitting: Family Medicine

## 2017-05-14 ENCOUNTER — Ambulatory Visit (INDEPENDENT_AMBULATORY_CARE_PROVIDER_SITE_OTHER): Payer: 59 | Admitting: Family Medicine

## 2017-05-14 ENCOUNTER — Encounter: Payer: Self-pay | Admitting: Family Medicine

## 2017-05-14 DIAGNOSIS — F332 Major depressive disorder, recurrent severe without psychotic features: Secondary | ICD-10-CM

## 2017-05-14 DIAGNOSIS — R52 Pain, unspecified: Secondary | ICD-10-CM | POA: Diagnosis not present

## 2017-05-14 DIAGNOSIS — E669 Obesity, unspecified: Secondary | ICD-10-CM | POA: Diagnosis not present

## 2017-05-14 DIAGNOSIS — E1169 Type 2 diabetes mellitus with other specified complication: Secondary | ICD-10-CM

## 2017-05-14 MED ORDER — METFORMIN HCL ER 500 MG PO TB24: 1000.0000 mg | ORAL_TABLET | Freq: Two times a day (BID) | ORAL | 1 refills | Status: DC

## 2017-05-14 MED ORDER — RANITIDINE HCL 150 MG PO TABS: 150.0000 mg | ORAL_TABLET | Freq: Two times a day (BID) | ORAL | 2 refills | Status: DC

## 2017-05-14 MED ORDER — CELECOXIB 200 MG PO CAPS: 200.0000 mg | ORAL_CAPSULE | Freq: Two times a day (BID) | ORAL | 3 refills | Status: DC

## 2017-05-14 NOTE — Patient Instructions (Signed)
Please increase your metformin to 2000mg  daily as tolerated.  Please use the celebrex twice daily.  Please use the ranitidine twice daily  Come back to see me in 2-3 months, or sooner as needed.  Take care, Dr Jimmey RalphParker

## 2017-05-14 NOTE — Progress Notes (Signed)
[facility] for Repetitive Transcranial Magnetic Stimulation treatment for severe episode of recurrent major depressive disorder, without psychotic features. Patient presented with appropriate affect, level mood and denied any suicidal or homicidal ideations. Patient denies any other current symptoms and remains optimistic with continued TMS treatment. Patient reported no change in alcohol/substane use, caffeine consumption, sleep pattern or metal implant status since previous treatment. Ptpresented to tx session with anxiousaffect.She had a terrible appointment with the rheumatologist in regards to finding the source of her chronic pain and muscle movement. 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 She is looking for a neurologist in town - will be contacting MD via MyChart for referral. Power levelwas at 120%for the duration of the tx.Patiient reported nocomplaints anddiscomfort during tx.Patientand motherdeparted post-treatment with no majorreported concerns.

## 2017-05-14 NOTE — Assessment & Plan Note (Addendum)
Patient recently saw her rheumatologist and is now on Plaquenil.  They were told that it may take up to 6 months to see if this medication is helping with her pain.  Discussed risks and benefits of starting NSAID with patient.  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  We will start Celebrex 200 mg twice daily as needed.  We will also start ranitidine 150 mg twice daily to give her GI protection.  Patient understood the increased risk for GI bleed while on these medications.  Discussed the need to look for signs of GI bleed including hematochezia and melena.  If symptoms continue to be poorly controlled, would consider referral back to neurosurgery or pain management.  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  May benefit from epidural steroid injections at some point.  Discussed with patient that we are nearing the end of my ability to help treat her pain.  Could consider systemic steroids to see if that helps some however we will need to watch out for her blood sugars.  Will likely ultimately need referral to pain management specialist.

## 2017-05-14 NOTE — Progress Notes (Signed)
   Subjective:  Deborah Soto is a 52 y.o. female who presents today with a chief complaint of diffuse pain.   HPI:  Diffuse Pain, established problem, stable Patient seen for this about 5 weeks ago.  Since pain is stable since our last visit.  She has seen rheumatology who started her on Plaquenil.  She continues to have back pain and joint pain/muscular pain throughout her upper and lower extremities.  She is currently on Lyrica 150 mg twice daily.  Thinks this helps some with her neuropathic pain, however still has significant muscular and joint pain.  Type 2 diabetes, established problem, stable Currently on metformin 500 mg twice daily.  Also on Trulicity 1.5 mg injection weekly.  She tolerates both these well without side effects.  Sugars usually in the 180s-210s.  No polyuria or polydipsia.  No hypoglycemic symptoms.  No nausea or vomiting.  No diarrhea.  ROS: Per HPI  Objective:  Physical Exam: BP 130/82 (BP Location: Right Arm, Patient Position: Sitting, Cuff Size: Large)   Pulse (!) 107   Temp 98.4 F (36.9 C) (Oral)   Ht 5\' 8"  (1.727 m)   Wt 252 lb 3.2 oz (114.4 kg)   SpO2 96%   BMI 38.35 kg/m   Gen: NAD, resting comfortably CV: RRR with no murmurs appreciated Pulm: NWOB, CTAB with no crackles, wheezes, or rhonchi MSK: -Back: No deformities.  Assessment/Plan:  Diffuse pain Patient recently saw her rheumatologist and is now on Plaquenil.  They were told that it may take up to 6 months to see if this medication is helping with her pain.  Discussed risks and benefits of starting NSAID with patient.  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  We will start Celebrex 200 mg twice daily as needed.  We will also start ranitidine 150 mg twice daily to give her GI protection.  Patient understood the increased risk for GI bleed while on these medications.  Discussed the need to look for signs of GI bleed including  hematochezia and melena.  If symptoms continue to be poorly controlled, would consider referral back to neurosurgery or pain management.  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  May benefit from epidural steroid injections at some point.  Discussed with patient that we are nearing the end of my ability to help treat her pain.  Could consider systemic steroids to see if that helps some however we will need to watch out for her blood sugars.  Will likely ultimately need referral to pain management specialist.  Diabetes mellitus type 2 in obese (HCC) Continue Trulicity.  Increase metformin to 1000 mg twice daily as tolerated.  She will follow-up with me in 2-3 months.  Will recheck A1c at that time.  [name], MD 05/14/2017 12:42 PM

## 2017-05-14 NOTE — Assessment & Plan Note (Signed)
Continue Trulicity.  Increase metformin to 1000 mg twice daily as tolerated.  She will follow-up with me in 2-3 months.  Will recheck A1c at that time.

## 2017-05-15 ENCOUNTER — Other Ambulatory Visit (INDEPENDENT_AMBULATORY_CARE_PROVIDER_SITE_OTHER): Payer: 59 | Admitting: Emergency Medicine

## 2017-05-15 DIAGNOSIS — F332 Major depressive disorder, recurrent severe without psychotic features: Secondary | ICD-10-CM

## 2017-05-15 NOTE — Progress Notes (Signed)
[facility] for Repetitive Transcranial Magnetic Stimulation treatment for severe episode of recurrent major depressive disorder, without psychotic features. Patient presented with appropriate affect, level mood and denied any suicidal or homicidal ideations. Patient denies any other current symptoms and remains optimistic with continued TMS treatment. Patient reported no change in alcohol/substane use, caffeine consumption, sleep pattern or metal implant status since previous treatment. Ptpresented to tx session with pleasantaffect. She watched TV during tx session. Power levelwas at 120%for the duration of the tx.Patiient reported nocomplaints anddiscomfort during tx.Patientand motherdeparted post-treatment with no majorreported concerns.

## 2017-05-16 ENCOUNTER — Other Ambulatory Visit (INDEPENDENT_AMBULATORY_CARE_PROVIDER_SITE_OTHER): Payer: 59 | Admitting: Emergency Medicine

## 2017-05-16 DIAGNOSIS — F332 Major depressive disorder, recurrent severe without psychotic features: Secondary | ICD-10-CM

## 2017-05-16 NOTE — Progress Notes (Signed)
[facility] for Repetitive Transcranial Magnetic Stimulation treatment for severe episode of recurrent major depressive disorder, without psychotic features. Patient presented with appropriate affect, level mood and denied any suicidal or homicidal ideations. Patient denies any other current symptoms and remains optimistic with continued TMS treatment. Patient reported no change in alcohol/substane use, caffeine consumption, sleep pattern or metal implant status since previous treatment. Ptpresented to tx session withpleasantaffect. She watched TV during tx session. She reported that she did not sleep well last night. She is accumulating to her sleeping pattern, which was disrupted by her weekend trip. She was tired this morning and briefly dozed off during tx session. Power levelwas at 120%for the duration of the tx.Patiient reported nocomplaints anddiscomfort during tx.Patientand motherdeparted post-treatment with no majorreported concerns.

## 2017-05-17 ENCOUNTER — Encounter (HOSPITAL_COMMUNITY): Payer: Self-pay

## 2017-05-17 ENCOUNTER — Other Ambulatory Visit (INDEPENDENT_AMBULATORY_CARE_PROVIDER_SITE_OTHER): Payer: 59 | Admitting: Emergency Medicine

## 2017-05-17 DIAGNOSIS — F332 Major depressive disorder, recurrent severe without psychotic features: Secondary | ICD-10-CM | POA: Diagnosis not present

## 2017-05-17 NOTE — Progress Notes (Signed)
[facility] for Repetitive Transcranial Magnetic Stimulation treatment for severe episode of recurrent major depressive disorder, without psychotic features. Patient presented with appropriate affect, level mood and denied any suicidal or homicidal ideations. Patient denies any other current symptoms and remains optimistic with continued TMS treatment. Patient reported no change in alcohol/substane use, caffeine consumption, sleep pattern or metal implant status since previous treatment. Ptpresented to tx session withpleasantaffect. She watched TV during tx session. She plans on having a restful weekend. Power levelwas at 120%for the duration of the tx.Patiient reported nocomplaints anddiscomfort during tx.Patientand motherdeparted post-treatment with no majorreported concerns.

## 2017-05-20 ENCOUNTER — Encounter (HOSPITAL_COMMUNITY): Payer: Self-pay

## 2017-05-20 ENCOUNTER — Encounter: Payer: Self-pay | Admitting: Family Medicine

## 2017-05-20 ENCOUNTER — Other Ambulatory Visit (INDEPENDENT_AMBULATORY_CARE_PROVIDER_SITE_OTHER): Payer: 59 | Admitting: Emergency Medicine

## 2017-05-20 DIAGNOSIS — F332 Major depressive disorder, recurrent severe without psychotic features: Secondary | ICD-10-CM

## 2017-05-20 NOTE — Progress Notes (Signed)
[facility] for Repetitive Transcranial Magnetic Stimulation treatment for severe episode of recurrent major depressive disorder, without psychotic features. Patient presented with appropriate affect, level mood and denied any suicidal or homicidal ideations. Patient denies any other current symptoms and remains optimistic with continued TMS treatment. Patient reported no change in alcohol/substane use, caffeine consumption, sleep pattern or metal implant status since previous treatment. Ptpresented to tx session withpleasantaffect. She watched TV during tx session. She shared that she has a lot of planning to do for her daughter's upcoming volleyball tournament. She has to arrange accomodation for 6 families.  Power levelwas at 120%for the duration of the tx.Patient reported nocomplaints anddiscomfort during tx.Patientand motherdeparted post-treatment with no majorreported concerns.

## 2017-05-21 ENCOUNTER — Other Ambulatory Visit (INDEPENDENT_AMBULATORY_CARE_PROVIDER_SITE_OTHER): Payer: 59 | Admitting: Emergency Medicine

## 2017-05-21 ENCOUNTER — Encounter (HOSPITAL_COMMUNITY): Payer: Self-pay

## 2017-05-21 DIAGNOSIS — F332 Major depressive disorder, recurrent severe without psychotic features: Secondary | ICD-10-CM | POA: Diagnosis not present

## 2017-05-21 NOTE — Progress Notes (Signed)
[facility] for Repetitive Transcranial Magnetic Stimulation treatment for severe episode of recurrent major depressive disorder, without psychotic features. Patient presented with appropriate affect, level mood and denied any suicidal or homicidal ideations. Patient denies any other current symptoms and remains optimistic with continued TMS treatment. Patient reported no change in alcohol/substane use, caffeine consumption, sleep pattern or metal implant status since previous treatment. Ptpresented to tx session withpleasantaffect. She watched TV during tx session.She expressed that she is starting to get anxious with planning the trip for her daughter's volleyball team. Power levelwas at 120%for the duration of the tx.Patient reported nocomplaints anddiscomfort during tx.Patientand motherdeparted post-treatment with no majorreported concerns.

## 2017-05-22 ENCOUNTER — Other Ambulatory Visit (INDEPENDENT_AMBULATORY_CARE_PROVIDER_SITE_OTHER): Payer: 59 | Admitting: Emergency Medicine

## 2017-05-22 ENCOUNTER — Encounter (HOSPITAL_COMMUNITY): Payer: Self-pay

## 2017-05-22 DIAGNOSIS — F332 Major depressive disorder, recurrent severe without psychotic features: Secondary | ICD-10-CM | POA: Diagnosis not present

## 2017-05-22 NOTE — Progress Notes (Signed)
[facility] for Repetitive Transcranial Magnetic Stimulation treatment for severe episode of recurrent major depressive disorder, without psychotic features. Patient presented with appropriate affect, level mood and denied any suicidal or homicidal ideations. Patient denies any other current symptoms and remains optimistic with continued TMS treatment. Patient reported no change in alcohol/substane use, caffeine consumption, sleep pattern or metal implant status since previous treatment. Ptpresented to tx session withpleasantaffect. She watched TV during tx session.She shared that she had a good night's rest last night.Power levelwas at 120%for the duration of the tx.Patient reported nocomplaints anddiscomfort during tx.Patientand motherdeparted post-treatment with no majorreported concerns.

## 2017-05-23 ENCOUNTER — Other Ambulatory Visit (HOSPITAL_COMMUNITY): Payer: 59 | Attending: Psychiatry | Admitting: Emergency Medicine

## 2017-05-23 ENCOUNTER — Encounter (HOSPITAL_COMMUNITY): Payer: Self-pay

## 2017-05-23 DIAGNOSIS — F332 Major depressive disorder, recurrent severe without psychotic features: Secondary | ICD-10-CM

## 2017-05-23 NOTE — Progress Notes (Signed)
[facility] for Repetitive Transcranial Magnetic Stimulation treatment for severe episode of recurrent major depressive disorder, without psychotic features. Patient presented with appropriate affect, level mood and denied any suicidal or homicidal ideations. Patient denies any other current symptoms and remains optimistic with continued TMS treatment. Patient reported no change in alcohol/substane use, caffeine consumption, sleep pattern or metal implant status since previous treatment. Ptpresented to tx session withpleasantaffect. She watched TV during tx session. [place] after tx session. She is anxious because some of the parent's are not being cooperative with the planning and driving accomodation.  Power levelwas at 120%for the duration of the tx.Patient reported nocomplaints anddiscomfort during tx.Patientand motherdeparted post-treatment with no majorreported concerns.

## 2017-05-27 ENCOUNTER — Other Ambulatory Visit (INDEPENDENT_AMBULATORY_CARE_PROVIDER_SITE_OTHER): Payer: 59

## 2017-05-27 ENCOUNTER — Encounter (HOSPITAL_COMMUNITY): Payer: Self-pay

## 2017-05-27 DIAGNOSIS — F332 Major depressive disorder, recurrent severe without psychotic features: Secondary | ICD-10-CM | POA: Diagnosis not present

## 2017-05-27 NOTE — Progress Notes (Signed)
[facility] for Repetitive Transcranial Magnetic Stimulation treatment for severe episode of recurrent major depressive disorder, without psychotic features. Patient presented with appropriate affect, level mood and denied any suicidal or homicidal ideations. Patient denies any other current symptoms and remains optimistic with continued TMS tx. Patient reported no changed in alcohol/substance use, caffeine consumption, sleep pattern or metal implant status since previous tx. Patient arrived for tx by herself. Patient reported that sinceTMS she has noticed small changes and has been feeling better. Patient reported no complaints or discomfort during the duration of tx. Patient watched television and talked about her family. Patient departed post-treatment with no reported concerns.

## 2017-05-28 ENCOUNTER — Encounter (HOSPITAL_COMMUNITY): Payer: Self-pay

## 2017-05-28 ENCOUNTER — Other Ambulatory Visit (INDEPENDENT_AMBULATORY_CARE_PROVIDER_SITE_OTHER): Payer: 59 | Admitting: Emergency Medicine

## 2017-05-28 DIAGNOSIS — F332 Major depressive disorder, recurrent severe without psychotic features: Secondary | ICD-10-CM | POA: Diagnosis not present

## 2017-05-28 NOTE — Progress Notes (Signed)
[facility] for Repetitive Transcranial Magnetic Stimulation treatment for severe episode of recurrent major depressive disorder, without psychotic features. Patient presented with appropriate affect, level mood and denied any suicidal or homicidal ideations. Patient denies any other current symptoms and remains optimistic with continued TMS treatment. Patient reported no change in alcohol/substane use, caffeine consumption, sleep pattern or metal implant status since previous treatment. Ptpresented to tx session withpleasantaffect. She expressed that she is adjusting to her sleeping [place] She [place] during tx session. Power levelwas at 120%for the duration of the tx.Patient reported nocomplaints anddiscomfort during tx.Patientdeparted post-treatment with no majorreported concerns.

## 2017-05-29 ENCOUNTER — Other Ambulatory Visit (INDEPENDENT_AMBULATORY_CARE_PROVIDER_SITE_OTHER): Payer: 59 | Admitting: Emergency Medicine

## 2017-05-29 ENCOUNTER — Encounter (HOSPITAL_COMMUNITY): Payer: Self-pay

## 2017-05-29 DIAGNOSIS — F332 Major depressive disorder, recurrent severe without psychotic features: Secondary | ICD-10-CM | POA: Diagnosis not present

## 2017-05-29 NOTE — Progress Notes (Signed)
Patient reported to [facility] for Repetitive Transcranial Magnetic Stimulation treatment for severe episode of recurrent major depressive disorder, without psychotic features. Patient presented with appropriate affect, level mood and denied any suicidal or homicidal ideations. Patient denies any other current symptoms and remains optimistic with continued TMS treatment. Patient reported no change in alcohol/substane use, caffeine consumption, sleep pattern or metal implant status since previous treatment. Ptpresented to tx session withpleasantaffect. She watched TV during tx session. Power levelwas at 120%for the duration of the tx.Patient reported nocomplaints anddiscomfort during tx.Patientand motherdeparted post-treatment with no majorreported concerns.   

## 2017-05-30 ENCOUNTER — Encounter (HOSPITAL_COMMUNITY): Payer: Self-pay

## 2017-05-30 ENCOUNTER — Other Ambulatory Visit (INDEPENDENT_AMBULATORY_CARE_PROVIDER_SITE_OTHER): Payer: 59 | Admitting: Emergency Medicine

## 2017-05-30 DIAGNOSIS — F332 Major depressive disorder, recurrent severe without psychotic features: Secondary | ICD-10-CM | POA: Diagnosis not present

## 2017-05-30 NOTE — Progress Notes (Signed)
[facility] for Repetitive Transcranial Magnetic Stimulation treatment for severe episode of recurrent major depressive disorder, without psychotic features. Patient presented with appropriate affect, level mood and denied any suicidal or homicidal ideations. Patient denies any other current symptoms and remains optimistic with continued TMS treatment. Patient reported no change in alcohol/substane use, caffeine consumption, sleep pattern or metal implant status since previous treatment. Ptpresented to tx session withpleasantaffect. She watched TV during tx session.She reported that she is experiencing body aches due to the dismal weather conditions. Power levelwas at 120%for the duration of the tx.Patient reported nocomplaints anddiscomfort during tx.Patientand motherdeparted post-treatment with no majorreported concerns.

## 2017-05-31 ENCOUNTER — Other Ambulatory Visit (INDEPENDENT_AMBULATORY_CARE_PROVIDER_SITE_OTHER): Payer: 59 | Admitting: Emergency Medicine

## 2017-05-31 ENCOUNTER — Encounter (HOSPITAL_COMMUNITY): Payer: Self-pay

## 2017-05-31 DIAGNOSIS — F332 Major depressive disorder, recurrent severe without psychotic features: Secondary | ICD-10-CM | POA: Diagnosis not present

## 2017-05-31 NOTE — Progress Notes (Signed)
[facility] for Repetitive Transcranial Magnetic Stimulation treatment for severe episode of recurrent major depressive disorder, without psychotic features. Patient presented with appropriate affect, level mood and denied any suicidal or homicidal ideations. Patient denies any other current symptoms and remains optimistic with continued TMS treatment. Patient reported no change in alcohol/substane use, caffeine consumption, sleep pattern or metal implant status since previous treatment. Ptpresented to tx session withpleasantaffect. She watched TV during tx session. Power levelwas at 120%for the duration of the tx.Patient reported nocomplaints anddiscomfort during tx.Patientand motherdeparted post-treatment with no majorreported concerns.

## 2017-06-03 ENCOUNTER — Other Ambulatory Visit (INDEPENDENT_AMBULATORY_CARE_PROVIDER_SITE_OTHER): Payer: 59 | Admitting: Emergency Medicine

## 2017-06-03 ENCOUNTER — Encounter (HOSPITAL_COMMUNITY): Payer: Self-pay

## 2017-06-03 DIAGNOSIS — F332 Major depressive disorder, recurrent severe without psychotic features: Secondary | ICD-10-CM

## 2017-06-03 NOTE — Progress Notes (Signed)
[facility] for Repetitive Transcranial Magnetic Stimulation treatment for severe episode of recurrent major depressive disorder, without psychotic features. Patient presented with appropriate affect, level mood and denied any suicidal or homicidal ideations. Patient denies any other current symptoms and remains optimistic with continued TMS treatment. Patient reported no change in alcohol/substane use, caffeine consumption, sleep pattern or metal implant status since previous treatment. Ptpresented to tx session withpleasantaffect. She watched TV during tx session. She shared that she had a good weekend attending her daughter's volleyball tournament.Power levelwas at 120%for the duration of the tx.Patient reported nocomplaints anddiscomfort during tx.Patientand motherdeparted post-treatment with no majorreported concerns.

## 2017-06-04 ENCOUNTER — Encounter (HOSPITAL_COMMUNITY): Payer: Self-pay

## 2017-06-04 ENCOUNTER — Other Ambulatory Visit (INDEPENDENT_AMBULATORY_CARE_PROVIDER_SITE_OTHER): Payer: 59 | Admitting: Emergency Medicine

## 2017-06-04 DIAGNOSIS — F332 Major depressive disorder, recurrent severe without psychotic features: Secondary | ICD-10-CM | POA: Diagnosis not present

## 2017-06-04 NOTE — Progress Notes (Addendum)
[facility] Repetitive Transcranial Magnetic Stimulation treatment for severe episode of recurrent major depressive disorder, without psychotic features. Patient presented with appropriate affect, level mood and denied any suicidal or homicidal ideations. Patient denies any other current symptoms and remains optimistic with continued TMS treatment. Patient reported no change in alcohol/substane use, caffeine consumption, sleep pattern or metal implant status since previous treatment. Ptpresented to tx session withpleasantaffect. She watched TV during tx session. She shared that she was able to catch up on sleep yesterday afternoon.Power levelwas at 120%for the duration of the tx.Patient reported nocomplaints anddiscomfort during tx.Patientdeparted post-treatment with no majorreported concerns.

## 2017-06-05 ENCOUNTER — Other Ambulatory Visit (INDEPENDENT_AMBULATORY_CARE_PROVIDER_SITE_OTHER): Payer: 59 | Admitting: Emergency Medicine

## 2017-06-05 ENCOUNTER — Encounter (HOSPITAL_COMMUNITY): Payer: Self-pay

## 2017-06-05 ENCOUNTER — Encounter (HOSPITAL_COMMUNITY): Payer: Self-pay | Admitting: Psychiatry

## 2017-06-05 ENCOUNTER — Ambulatory Visit (INDEPENDENT_AMBULATORY_CARE_PROVIDER_SITE_OTHER): Payer: 59 | Admitting: Psychiatry

## 2017-06-05 DIAGNOSIS — F332 Major depressive disorder, recurrent severe without psychotic features: Secondary | ICD-10-CM | POA: Diagnosis not present

## 2017-06-05 DIAGNOSIS — R52 Pain, unspecified: Secondary | ICD-10-CM | POA: Diagnosis not present

## 2017-06-05 DIAGNOSIS — Z5181 Encounter for therapeutic drug level monitoring: Secondary | ICD-10-CM

## 2017-06-05 DIAGNOSIS — Z810 Family history of intellectual disabilities: Secondary | ICD-10-CM | POA: Diagnosis not present

## 2017-06-05 DIAGNOSIS — Z87891 Personal history of nicotine dependence: Secondary | ICD-10-CM

## 2017-06-05 NOTE — Progress Notes (Signed)
[facility] for Repetitive Transcranial Magnetic Stimulation treatment for severe episode of recurrent major depressive disorder, without psychotic features. Patient presented with appropriate affect, level mood and denied any suicidal or homicidal ideations. Patient denies any other current symptoms and remains optimistic with continued TMS treatment. Patient reported no change in alcohol/substane use, caffeine consumption, sleep pattern or metal implant status since previous treatment. Ptpresented to tx session withpleasantaffect. She shared that TMS has been slightly helping improve the quality of her life. She does not reminisce on the thought of death like she once did before. In addition, she periodically thinks about the things she use to enjoy such as gardening and reading. However, traveling for her daughter's volleyball games every weekend has prevented her from doing activities she once enjoyed. She reported that she is looking forward to her appointment with Dr. Rene KocherEksir this morning.Power levelwas at 120%for the duration of the tx.Patient reported nocomplaints anddiscomfort during tx.Patientdeparted post-treatment with no majorreported concerns.

## 2017-06-05 NOTE — Progress Notes (Signed)
BH MD/PA/NP OP Progress Note  06/05/2017 11:17 AM Deborah Soto  MRN:  [id number]  Chief Complaint: Doing a little better HPI: Deborah Soto presents for TMS follow-up.  She reports that she has had a subjective improvement in her depression of approximately 40-50%.  This is consistent with PHQ scores collected by clinic.  Her husband also feels that approximately 40-50% improvement is about correct.  She does not feel quite helpful, but she does feel a little bit more motivated.  She reports that she has more interest in things such as gardening which she had previously felt completely absent from.  Notably, during her interaction, she is much less tearful, and does make a few jokes which is quite unusual for my interactions with the patient in clinic and at TMS care.  She will continue to follow up with Dr. Jennelle Human for medication management.  I suggested she discuss consideration of tapering Zyprexa, out of concern that this may cause some reduction in her hedonic drive.  With regard to lithium, and nortriptyline, we agreed to obtain laboratory studies today and fax over to Dr. Jennelle Human.  Particularly given that she was started on celecoxib by primary care provider, for chronic pain.   She does not have any acute safety issues or suicidality.  She is going to be completing her TMS treatment on Monday next week.  We discussed following up in 5-6 months for consideration of repeat treatment if needed at that time.  Visit Diagnosis:    ICD-10-CM   1. Major depressive disorder, recurrent, severe without psychotic features (HCC) F33.2 TSH + free T4    Lithium level    Prolactin    Comprehensive metabolic panel    CBC with Differential    Hemoglobin A1c    Nortriptyline level    Vitamin D 1,25 dihydroxy    Lipid Profile    Lipid Profile    Vitamin D 1,25 dihydroxy    Nortriptyline level    Hemoglobin A1c    CBC with Differential    Comprehensive metabolic panel    Prolactin    Lithium  level    TSH + free T4  2. Diffuse pain R52   3. Medication monitoring encounter Z51.81 TSH + free T4    Lithium level    Prolactin    Comprehensive metabolic panel    CBC with Differential    Hemoglobin A1c    Nortriptyline level    Vitamin D 1,25 dihydroxy    Lipid Profile    Lipid Profile    Vitamin D 1,25 dihydroxy    Nortriptyline level    Hemoglobin A1c    CBC with Differential    Comprehensive metabolic panel    Prolactin    Lithium level    TSH + free T4    Past Psychiatric History: We will complete TMS treatment on Monday, April 1  Past Medical History:  Past Medical History:  Diagnosis Date  . Anxiety   . Asthma   . Depression   . Diabetes mellitus type 2 in obese (HCC) 06/11/2016  . Embolism - blood clot January 1997 & January 2017   Also had LLE DVT in 1997  . Hypertension   . Preeclampsia 10/10/2011   1999   . Sleep apnea     Past Surgical History:  Procedure Laterality Date  . NO PAST SURGERIES      Family Psychiatric History: See intake H&P for full details. Reviewed, with no updates at this time.  Family History:  Family History  Problem Relation Age of Onset  . Dementia Father   . Heart disease Father   . Clotting disorder Mother   . Arthritis Mother   . Clotting disorder Sister   . Clotting disorder Maternal Grandmother   . Clotting disorder Maternal Aunt   . Rheumatologic disease Neg Hx   . Hyperparathyroidism Neg Hx     Social History:  Social History   Socioeconomic History  . Marital status: Married    Spouse name: Not on file  . Number of children: Y  . Years of education: Not on file  . Highest education level: Not on file  Occupational History  . Occupation: admin assist  Social Needs  . Financial resource strain: Not on file  . Food insecurity:    Worry: Not on file    Inability: Not on file  . Transportation needs:    Medical: Not on file    Non-medical: Not on file  Tobacco Use  . Smoking status: Former  Smoker    Packs/day: 1.00    Years: 20.00    Pack years: 20.00    Last attempt to quit: 03/12/2004    Years since quitting: 13.2  . Smokeless tobacco: Never Used  Substance and Sexual Activity  . Alcohol use: Yes    Alcohol/week: 0.6 oz    Types: 1 Cans of beer per week    Comment: 1-2 times a week  . Drug use: No  . Sexual activity: Yes    Partners: Male    Birth control/protection: None  Lifestyle  . Physical activity:    Days per week: Not on file    Minutes per session: Not on file  . Stress: Not on file  Relationships  . Social connections:    Talks on phone: Not on file    Gets together: Not on file    Attends religious service: Not on file    Active member of club or organization: Not on file    Attends meetings of clubs or organizations: Not on file    Relationship status: Not on file  Other Topics Concern  . Not on file  Social History Narrative   Originally from [place]. Always lived in [place]. 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 No international travel recently. Previously has been to [place] in May 1996. No mold exposure recently. No bird exposure. Does have multiple pets.     Allergies:  Allergies  Allergen Reactions  . Tramadol Other (See Comments)    Tingling all over     Metabolic Disorder Labs: Lab Results  Component Value Date   HGBA1C 6.2 02/22/2017   No results found for: PROLACTIN Lab Results  Component Value Date   CHOL 203 (H) 10/10/2011   TRIG 123 10/10/2011   HDL 48 10/10/2011   CHOLHDL 4.2 10/10/2011   VLDL 25 10/10/2011   LDLCALC 130 (H) 10/10/2011   Lab Results  Component Value Date   TSH 3.81 02/22/2017   TSH 0.676 06/11/2016    Therapeutic Level Labs: No results found for: LITHIUM No results found for: VALPROATE No components found for:  CBMZ  Current Medications: Current Outpatient Medications  Medication Sig Dispense Refill  . atorvastatin (LIPITOR) 10 MG tablet Take 1 tablet (10 mg total) by mouth daily. 90  tablet 1  . celecoxib (CELEBREX) 200 MG capsule Take 1 capsule (200 mg total) by mouth 2 (two) times daily. 60 capsule 3  . Dulaglutide (TRULICITY) 1.5 MG/0.5ML  SOPN Inject weekly. 12 pen 3  . hydroxychloroquine (PLAQUENIL) 200 MG tablet Take 400 mg by mouth daily.    . hydrOXYzine (ATARAX/VISTARIL) 25 MG tablet Take 0.5-1 tablets (12.5-25 mg total) by mouth 3 (three) times daily as needed for itching. (Patient taking differently: Take 12.5-25 mg by mouth 3 (three) times daily as needed for anxiety. ) 30 tablet 0  . lidocaine (LIDODERM) 5 % Place 1 patch onto the skin daily. Remove & Discard patch within 12 hours or as directed by MD 30 patch 0  . lithium carbonate (LITHOBID) 300 MG CR tablet Take 300 mg by mouth 3 (three) times daily.     . metFORMIN (GLUCOPHAGE XR) 500 MG 24 hr tablet Take 2 tablets (1,000 mg total) by mouth 2 (two) times daily. 180 tablet 1  . nortriptyline (PAMELOR) 50 MG capsule Take 150 mg by mouth at bedtime.     Marland Kitchen OLANZapine (ZYPREXA) 10 MG tablet Take 10 mg by mouth at bedtime.    Deborah Soto DELICA LANCETS 33G MISC Ck Blood sugar up to 4 times a day as directed. ICD-10 E11.69, E66.9 400 each 0  . ONETOUCH VERIO test strip CHECK BLOOD SUGAR UP TO 4 TIMES A DAY AS DIRECTED. 100 each 3  . pregabalin (LYRICA) 75 MG capsule Take 2 capsules (150 mg total) by mouth 2 (two) times daily. 120 capsule 3  . propranolol (INDERAL) 20 MG tablet Take 20 mg by mouth daily as needed.    . ranitidine (ZANTAC) 150 MG tablet Take 1 tablet (150 mg total) by mouth 2 (two) times daily. 60 tablet 2  . rivaroxaban (XARELTO) 20 MG TABS tablet TAKE 1 TABLET DAILY WITH SUPPER: Blood thiner (Patient taking differently: 10 mg daily. TAKE 1 TABLET DAILY WITH SUPPER: Blood thiner) 1 tablet 0  . Vitamin D, Ergocalciferol, (DRISDOL) 50000 units CAPS capsule Take 50,000 Units by mouth every other day. Sunday, Wednesday      No current facility-administered medications for this visit.       Musculoskeletal: Strength & Muscle Tone: within normal limits Gait & Station: normal Patient leans: N/A  Psychiatric Specialty Exam: ROS  There were no vitals taken for this visit.There is no height or weight on file to calculate BMI.  General Appearance: Casual and Fairly Groomed  Eye Contact:  Much better than last visit  Speech:  Clear and Coherent and Normal Rate  Volume:  Low volume, but generally more talkative  Mood:  Dysphoric  Affect:  Congruent and Constricted  Thought Process:  Goal Directed and Descriptions of Associations: Intact  Orientation:  Full (Time, Place, and Person)  Thought Content: Logical   Suicidal Thoughts:  No  Homicidal Thoughts:  No  Memory:  Recent;   Good  Judgement:  Good  Insight:  Fair and Present  Psychomotor Activity:  Normal  Concentration:  Concentration: Fair  Recall:  Fair  Fund of Knowledge: Good  Language: Good  Akathisia:  Negative  Handed:  Right  AIMS (if indicated): not done  Assets:  Communication Skills Desire for Improvement Financial Resources/Insurance Housing Intimacy Social Support Transportation  ADL's:  Intact  Cognition: WNL  Sleep:  Good   Screenings: AIMS     [facility]  AIMS Total Score  0    PHQ2-9     Office Visit from 12/12/2016 in Primary Care at [facility] Visit from 10/23/2016 in Primary Care at [facility] Visit from 10/15/2016 in Primary  Care at [facility] Visit from 07/24/2016 [facility] at [facility] Visit from 05/30/2016 in [facility] at [facility]  PHQ-2 Total Score  0  0  0  0  1  PHQ-9 Total Score  -  -  -  -  10       Assessment and Plan: Deborah BoardMichelle Soto is a 52 year old female with severe MDD complicated by chronic pain, presents today for TMS follow-up.  She will be completing her treatment on Monday, April 1, and has been able to tolerate TMS without any significant intolerance or enduring side  effects.  She has appreciated approximately 40-50% improvement in her depressive symptoms.  She is able to appreciate some increase in her level of cognitive motivation, and improved sense of humor which can sometimes be a useful measure of affective changes  I anticipate that she will continue to accrue benefit over the coming weeks and months, particularly if she is able to increase her level of behavioral activation and psychic momentum.  I am wondering if a reduction in Zyprexa would help to allow for an increase in her hedonic drive.  I will defer to her primary psychiatrist, as he knows the patient far better than I do in regard to medication management.  Given that she was recently started on celecoxib, we will check lithium levels, kidney function and forward to PCP and primary psychiatrist.   I would like to see the patient back in approximately 5-6 months, at which point we may consider a repeat treatment if needed.  Given her response to this round of TMS, I am hopeful that the treatment has provided her some improvement in her quality of life  1. Major depressive disorder, recurrent, severe without psychotic features (HCC)   2. Diffuse pain   3. Medication monitoring encounter     Status of current problems: gradually improving  Labs Ordered: Orders Placed This Encounter  Procedures  . TSH + free T4    Standing Status:   Future    Number of Occurrences:   1    Standing Expiration Date:   06/06/2018  . Lithium level    Standing Status:   Future    Number of Occurrences:   1    Standing Expiration Date:   06/06/2018  . Prolactin    Standing Status:   Future    Number of Occurrences:   1    Standing Expiration Date:   06/06/2018  . Comprehensive metabolic panel    Standing Status:   Future    Number of Occurrences:   1    Standing Expiration Date:   06/06/2018  . CBC with Differential    Standing Status:   Future    Number of Occurrences:   1    Standing Expiration Date:    06/06/2018  . Hemoglobin A1c    Standing Status:   Future    Number of Occurrences:   1    Standing Expiration Date:   06/06/2018  . Nortriptyline level    Standing Status:   Future    Number of Occurrences:   1    Standing Expiration Date:   06/06/2018  . Vitamin D 1,25 dihydroxy    Standing Status:   Future    Number of Occurrences:   1    Standing Expiration Date:   06/06/2018  . Lipid Profile    Standing Status:   Future    Number of Occurrences:   1  Standing Expiration Date:   06/06/2018    Labs Reviewed: obtaining today  Collateral Obtained/Records Reviewed: husband is present and also able to appreciate approximately 40-50% subjective improvement in her depressive symptoms  Plan:  Continue follow-up with Dr. Jennelle Human for psychiatric medication management Return to clinic in 5-6 months for TMS reassessment Laboratory studies as ordered  I spent 25 minutes with the patient in direct face-to-face clinical care.  Greater than 50% of this time was spent in counseling and coordination of care with the patient.    Burnard Leigh, MD 06/05/2017, 11:17 AM

## 2017-06-06 ENCOUNTER — Encounter: Payer: Self-pay | Admitting: Family Medicine

## 2017-06-06 ENCOUNTER — Other Ambulatory Visit (INDEPENDENT_AMBULATORY_CARE_PROVIDER_SITE_OTHER): Payer: 59 | Admitting: Emergency Medicine

## 2017-06-06 DIAGNOSIS — F332 Major depressive disorder, recurrent severe without psychotic features: Secondary | ICD-10-CM | POA: Diagnosis not present

## 2017-06-06 NOTE — Progress Notes (Signed)
I spoke with patient regarding results. No acute issues

## 2017-06-06 NOTE — Progress Notes (Signed)
[facility] for Repetitive Transcranial Magnetic Stimulation treatment for severe episode of recurrent major depressive disorder, without psychotic features. Patient presented with appropriate affect, level mood and denied any suicidal or homicidal ideations. Patient denies any other current symptoms and remains optimistic with continued TMS treatment. Patient reported no change in alcohol/substane use, caffeine consumption, sleep pattern or metal implant status since previous treatment. Ptpresented to tx session withpleasantaffect. She shared that her appointment with Dr. Rene KocherEksir went well. She watched TV during tx session.Power levelwas at 120%for the duration of the tx.Patient reported nocomplaints anddiscomfort during tx.Patientdeparted post-treatment with no majorreported concerns.

## 2017-06-07 ENCOUNTER — Other Ambulatory Visit (INDEPENDENT_AMBULATORY_CARE_PROVIDER_SITE_OTHER): Payer: 59 | Admitting: Emergency Medicine

## 2017-06-07 DIAGNOSIS — F332 Major depressive disorder, recurrent severe without psychotic features: Secondary | ICD-10-CM

## 2017-06-07 NOTE — Progress Notes (Signed)
[facility] for Repetitive Transcranial Magnetic Stimulation treatment for severe episode of recurrent major depressive disorder, without psychotic features. Patient presented with appropriate affect, level mood and denied any suicidal or homicidal ideations. Patient denies any other current symptoms and remains optimistic with continued TMS treatment. Patient reported no change in alcohol/substane use, caffeine consumption, sleep pattern or metal implant status since previous treatment. Ptpresented to tx session withpleasantaffect.She shared that she is having a good morning. She watched TV during tx session. Power levelwas at 120%for the duration of the tx.Patient reported nocomplaints anddiscomfort during tx.Patientdeparted post-treatment with no majorreported concerns.

## 2017-06-08 LAB — VITAMIN D 1,25 DIHYDROXY
Vitamin D 1, 25 (OH)2 Total: 38 pg/mL (ref 18–72)
Vitamin D2 1, 25 (OH)2: 38 pg/mL
Vitamin D3 1, 25 (OH)2: <8 pg/mL

## 2017-06-08 LAB — CBC WITH DIFFERENTIAL/PLATELET
Basophils Absolute: 73 cells/uL (ref 0–200)
Basophils Relative: 0.8 %
Eosinophils Absolute: 692 cells/uL — ABNORMAL HIGH (ref 15–500)
Eosinophils Relative: 7.6 %
HCT: 40.8 % (ref 35.0–45.0)
Hemoglobin: 14 g/dL (ref 11.7–15.5)
Lymphs Abs: 2330 cells/uL (ref 850–3900)
MCH: 29.7 pg (ref 27.0–33.0)
MCHC: 34.3 g/dL (ref 32.0–36.0)
MCV: 86.6 fL (ref 80.0–100.0)
MPV: 10.8 fL (ref 7.5–12.5)
Monocytes Relative: 8.4 %
Neutro Abs: 5242 cells/uL (ref 1500–7800)
Neutrophils Relative %: 57.6 %
Platelets: 306 10*3/uL (ref 140–400)
RBC: 4.71 10*6/uL (ref 3.80–5.10)
RDW: 12.6 % (ref 11.0–15.0)
Total Lymphocyte: 25.6 %
WBC mixed population: 764 cells/uL (ref 200–950)
WBC: 9.1 10*3/uL (ref 3.8–10.8)

## 2017-06-08 LAB — COMPREHENSIVE METABOLIC PANEL
AG Ratio: 2.2 (calc) (ref 1.0–2.5)
ALT: 39 U/L — ABNORMAL HIGH (ref 6–29)
AST: 28 U/L (ref 10–35)
Albumin: 4.6 g/dL (ref 3.6–5.1)
Alkaline phosphatase (APISO): 53 U/L (ref 33–115)
BUN: 11 mg/dL (ref 7–25)
CO2: 25 mmol/L (ref 20–32)
Calcium: 9.8 mg/dL (ref 8.6–10.2)
Chloride: 106 mmol/L (ref 98–110)
Creat: 0.57 mg/dL (ref 0.50–1.10)
Globulin: 2.1 g/dL (calc) (ref 1.9–3.7)
Glucose, Bld: 114 mg/dL (ref 65–139)
Potassium: 4.6 mmol/L (ref 3.5–5.3)
Sodium: 142 mmol/L (ref 135–146)
Total Bilirubin: 0.4 mg/dL (ref 0.2–1.2)
Total Protein: 6.7 g/dL (ref 6.1–8.1)

## 2017-06-08 LAB — LIPID PANEL
Cholesterol: 105 mg/dL (ref <200)
HDL: 53 mg/dL (ref >50)
LDL Cholesterol (Calc): 33 mg/dL (calc)
Non-HDL Cholesterol (Calc): 52 mg/dL (calc) (ref <130)
Total CHOL/HDL Ratio: 2 (calc) (ref <5.0)
Triglycerides: 105 mg/dL (ref <150)

## 2017-06-08 LAB — HEMOGLOBIN A1C
Hgb A1c MFr Bld: 7.3 % of total Hgb — ABNORMAL HIGH (ref <5.7)
Mean Plasma Glucose: 163 (calc)
eAG (mmol/L): 9 (calc)

## 2017-06-08 LAB — NORTRIPTYLINE LEVEL: Nortriptyline Lvl: 109 mcg/L (ref 50–150)

## 2017-06-08 LAB — TSH+FREE T4: TSH W/REFLEX TO FT4: 1.77 mIU/L

## 2017-06-08 LAB — PROLACTIN: Prolactin: 7.6 ng/mL

## 2017-06-08 LAB — LITHIUM LEVEL: Lithium Lvl: 0.6 mmol/L (ref 0.6–1.2)

## 2017-06-10 ENCOUNTER — Other Ambulatory Visit (HOSPITAL_COMMUNITY): Payer: 59 | Attending: Psychiatry

## 2017-06-10 ENCOUNTER — Encounter (HOSPITAL_COMMUNITY): Payer: Self-pay

## 2017-06-10 ENCOUNTER — Encounter (INDEPENDENT_AMBULATORY_CARE_PROVIDER_SITE_OTHER): Payer: Self-pay

## 2017-06-10 DIAGNOSIS — F332 Major depressive disorder, recurrent severe without psychotic features: Secondary | ICD-10-CM | POA: Diagnosis not present

## 2017-06-10 NOTE — Progress Notes (Signed)
Patient reported to [facility] for Repetitive Transcranial Magnetic Stimulation treatment for severe episode of recurrent major depressive disorder, without psychotic features. Patient presented with appropriate affect, level mood and denied any suicidal or homicidal ideations. Patient denies any other current symptoms and remains optimistic with continued TMS tx. Patient reported no changed in alcohol/substance use, caffeine consumption, sleep pattern or metal implant status since previous tx. Patient arrived for tx by herself. Patient reported no complaints or discomfort during the duration of tx. Patient watched television. Patient departed post-treatment with no reported concerns. 

## 2017-06-11 ENCOUNTER — Other Ambulatory Visit (INDEPENDENT_AMBULATORY_CARE_PROVIDER_SITE_OTHER): Payer: 59 | Admitting: Emergency Medicine

## 2017-06-11 ENCOUNTER — Encounter (HOSPITAL_COMMUNITY): Payer: Self-pay

## 2017-06-11 DIAGNOSIS — F332 Major depressive disorder, recurrent severe without psychotic features: Secondary | ICD-10-CM | POA: Diagnosis not present

## 2017-06-11 NOTE — Progress Notes (Signed)
[facility] for Repetitive Transcranial Magnetic Stimulation treatment for severe episode of recurrent major depressive disorder, without psychotic features. Patient presented with appropriate affect, level mood and denied any suicidal or homicidal ideations. Patient denies any other current symptoms and remains optimistic with continued TMS treatment. Patient reported no change in alcohol/substane use, caffeine consumption, sleep pattern or metal implant status since previous treatment. Ptpresented to tx session withpleasantaffect.She reported that she has been able to catch up on sleep the past couple of days, which has provided her with more energy through out the day. In addition, she reported that since her termination of Zyprexa per advise of MD, she has been having strange vivid dreams. During tx session, she watched TV and relaxed. Power levelwas at 120%for the duration of the tx.Patient reported nocomplaints anddiscomfort during tx.Patientdeparted post-treatment with no majorreported concerns.

## 2017-06-17 ENCOUNTER — Encounter (HOSPITAL_COMMUNITY): Payer: Self-pay

## 2017-06-19 ENCOUNTER — Encounter: Payer: Self-pay | Admitting: Physician Assistant

## 2017-06-20 ENCOUNTER — Other Ambulatory Visit: Payer: Self-pay | Admitting: Family Medicine

## 2017-06-20 NOTE — Telephone Encounter (Signed)
Rx sent in

## 2017-06-20 NOTE — Telephone Encounter (Signed)
Please advise 

## 2017-07-22 ENCOUNTER — Other Ambulatory Visit: Payer: Self-pay | Admitting: Family Medicine

## 2017-07-24 ENCOUNTER — Ambulatory Visit: Payer: BLUE CROSS/BLUE SHIELD | Admitting: Family Medicine

## 2017-07-24 ENCOUNTER — Encounter: Payer: Self-pay | Admitting: Family Medicine

## 2017-07-24 VITALS — BP 120/80 | HR 102 | Temp 98.7°F | Resp 16 | Ht 68.0 in | Wt 244.0 lb

## 2017-07-24 DIAGNOSIS — E1169 Type 2 diabetes mellitus with other specified complication: Secondary | ICD-10-CM | POA: Diagnosis not present

## 2017-07-24 DIAGNOSIS — E669 Obesity, unspecified: Secondary | ICD-10-CM

## 2017-07-24 DIAGNOSIS — R52 Pain, unspecified: Secondary | ICD-10-CM | POA: Diagnosis not present

## 2017-07-24 DIAGNOSIS — E668 Other obesity: Secondary | ICD-10-CM

## 2017-07-24 DIAGNOSIS — R251 Tremor, unspecified: Secondary | ICD-10-CM

## 2017-07-24 DIAGNOSIS — F332 Major depressive disorder, recurrent severe without psychotic features: Secondary | ICD-10-CM | POA: Diagnosis not present

## 2017-07-24 NOTE — Assessment & Plan Note (Signed)
Doing well on Celebrex.  We will continue this.  Also continue Lyrica and Pamelor.

## 2017-07-24 NOTE — Patient Instructions (Addendum)
Was very nice to see you today!  I am not sure what is causing your tremors.  I do not think that this is a serious, life-threatening issue.  Your neurological exam today is normal.  You have had recent blood work and a brain MRI which were also normal.  It is possible that this could be due to your peripheral neuropathy.  I would like for you to see a neurologist to have nerve studies done.  You can try taking propranolol to see if this helps with your tremors.  No other changes today.  Please come back to see me in 2-3 months, or sooner as needed.   Take care, Dr [name]

## 2017-07-24 NOTE — Assessment & Plan Note (Signed)
Doing well on metformin and Trulicity.  We will continue this.  Patient will follow-up in 2 to 3 months to recheck A1c.

## 2017-07-24 NOTE — Assessment & Plan Note (Signed)
BMI 38.77 today.  Not fully addressed due to other pressing concerns.  Continue metformin and Trulicity which will hopefully help some with weight loss.  Readdress at future office visit.

## 2017-07-24 NOTE — Progress Notes (Signed)
Subjective:  Deborah Soto is a 52 y.o. female who presents today with a chief complaint of tremors.   HPI:  Tremors, New Problem Started a few months ago.  Occur randomly.  Located all throughout her body including bilateral arms and legs.  Worsened over the last several weeks.  She has occasionally dropped food and plates due to the tremors and "twitchiness."  Symptoms come and go and are not present all the time.  She has had more difficulty writing as well.  No obvious alleviating or aggravating factors.  She has tried taking propanolol 20 mg which did not significantly seem to help.  No recent medication changes.  Checks her blood sugar during the episodes typically in the low 100 range.  Occasionally has difficulty walking due to the tremors.   Diffuse Pain, chronic problem, improving Patient seen about 2 months ago for this.  At that time we decided to start her on Celebrex and ranitidine.  She has done very well with this and has noted significant improvement in her pain.  No reported signs of bleeding.  She also sees her rheumatologist started on Plaquenil.  She is not sure if this was helping.  She will follow-up with her rheumatologist in about 3 months.  T2DM, established problem, stable Currently on metformin 2000 mg twice daily and Trulicity 0.15 mg weekly.  Tolerates both these well without side effects.  Sugars are usually in the low 100s.  Depression, established from, stable Patient recently completed TMS therapy.  States that this worked somewhat and she is interested in having repeat series of treatment soon.  ROS: Per HPI  PMH: She reports that she quit smoking about 13 years ago. She has a 20.00 pack-year smoking history. She has never used smokeless tobacco. She reports that she drinks about 0.6 oz of alcohol per week. She reports that she does not use drugs.  Objective:  Physical Exam: BP 120/80   Pulse (!) 102   Temp 98.7 F (37.1 C) (Oral)   Resp 16    Ht  (1.727 m)   Wt 255 lb (115.7 kg)   LMP  (LMP Unknown) Comment: last period ---  1 year ago (?)  SpO2 99%   BMI 38.77 kg/m   Gen: NAD, resting comfortably CV: RRR with no murmurs appreciated Pulm: NWOB, CTAB with no crackles, wheezes, or rhonchi Skin: Warm, dry Neuro: Cranial nerves II through XII intact.  Strength 5 out of 5 in upper and lower extremities.  Finger-nose/finger testing intact bilaterally without tremor.  No upper extremity asterixis.  Heel-shin testing intact in bilateral lower extremities.  No clonus.  Biceps and patellar reflexes 2+ and symmetric bilaterally. Psych: Normal affect and thought content  Assessment/Plan:  Diffuse pain Doing well on Celebrex.  We will continue this.  Also continue Lyrica and Pamelor.  MDD (major depressive disorder), recurrent severe, without psychosis (HCC) Stable and improving after TMS.  Continue Pamelor.  Defer further management to psychiatry.  Diabetes mellitus type 2 in obese [facility]) Doing well on metformin and Trulicity.  We will continue this.  Patient will follow-up in 2 to 3 months to recheck A1c.  Moderate obesity BMI 38.77 today.  Not fully addressed due to other pressing concerns.  Continue metformin and Trulicity which will hopefully help some with weight loss.  Readdress at future office visit.  Occasional tremors Unclear etiology.  Her neurological exam today is normal.  She has had recent blood work and a brain MRI  which were also normal.  It is possible this could be multifactorial in setting of lithium, Lyrica, and peripheral neuropathy.  She was advised to try taking 1000 mg of vitamin B6 to see if this helped by another physician.  She could also be having some element of essential tremor.  Advised patient to try taking propranolol to see if this helps.  Will place referral to neurology for further evaluation.  May need nerve conduction studies.  Katina Degree. [name], MD 07/24/2017 10:26 AM

## 2017-07-24 NOTE — Assessment & Plan Note (Signed)
Stable and improving after TMS.  Continue Pamelor.  Defer further management to psychiatry.

## 2017-07-25 ENCOUNTER — Encounter: Payer: Self-pay | Admitting: Neurology

## 2017-07-30 NOTE — Progress Notes (Signed)
 Subjective:   Deborah Soto was seen in consultation in the movement disorder clinic at the request of Parker, Caleb M, MD.  The evaluation is for tremor.  The records that were made available to me were reviewed.  Patient reports tremor is all over the body, including her arms and legs.  Reports that sometimes she has difficulty ambulating because of tremor.  Symptoms come and go.  Tremor started a year ago.  It is mostly in the arms/legs.  It is mostly with fine motor coordination/grasping of objects.  Aggravating factors include use of the arm/hands.  Alleviating factors are not known.  There is no  family hx of tremor.  She also notes a random "twitching" in the arm or leg.  Also c/o weak legs.  Patient has been seen at [facility].  She saw [facility] on October 03, 2016 for evaluation of "possible MS and chronic pain."  She described a bandlike tightness that was widespread all over her body, but most prominent around the abdomen.  She was previously told she had fibromyalgia, but the patient was leery about the diagnosis.  An extensive work-up was undertaken.  NMO was negative.  Vitamin B6 was normal.  TSH was normal at 2.33.  Methylmalonic acid was unremarkable.  B12 was somewhat low at 215.  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  The spinal cord itself was unremarkable in the cervical and thoracic region.  MRI of the brain was completed on March 25, 2017.  This was unremarkable.  Affected by caffeine:  No. Affected by alcohol:  Yes.   ("it helps a lot") Affected by stress:  Yes.  , makes worse Affected by fatigue:  unknown Spills soup if on spoon:  No. Spills glass of liquid if full:  No. Affects ADL's (tying shoes, brushing teeth, etc):  No.   Current/Previously tried tremor medications: propranolol - 20 mg q day to bid (may help but she isn't sure)  Current medications that may exacerbate tremor:  Lithium (been on it for about a year per  husband)  Outside reports reviewed: historical medical records, lab reports and office notes.  Allergies  Allergen Reactions  . Tramadol Other (See Comments)    Tingling all over     Outpatient Encounter Medications as of 08/01/2017  Medication Sig  . atorvastatin (LIPITOR) 10 MG tablet Take 1 tablet (10 mg total) by mouth daily.  . Blood Glucose Monitoring Suppl (ONETOUCH VERIO) w/Device KIT   . celecoxib (CELEBREX) 200 MG capsule Take 1 capsule (200 mg total) by mouth 2 (two) times daily.  . Dulaglutide (TRULICITY) 1.5 MG/0.5ML SOPN Inject weekly.  . hydroxychloroquine (PLAQUENIL) 200 MG tablet Take 400 mg by mouth daily.  . hydrOXYzine (ATARAX/VISTARIL) 25 MG tablet Take 0.5-1 tablets (12.5-25 mg total) by mouth 3 (three) times daily as needed for itching. (Patient taking differently: Take 12.5-25 mg by mouth 3 (three) times daily as needed for anxiety. )  . lidocaine (LIDODERM) 5 % Place 1 patch onto the skin daily. Remove & Discard patch within 12 hours or as directed by MD  . lithium carbonate (LITHOBID) 300 MG CR tablet Take 300 mg by mouth 3 (three) times daily.   . LYRICA 75 MG capsule TAKE (2) CAPSULES BY MOUTH TWICE DAILY.  . metFORMIN (GLUCOPHAGE-XR) 500 MG 24 hr tablet TAKE (2) TABLETS TWICE DAILY.  . nortriptyline (PAMELOR) 50 MG capsule Take 150 mg by mouth at bedtime.   . ONETOUCH DELICA LANCETS   33G MISC Ck Blood sugar up to 4 times a day as directed. ICD-10 E11.69, E66.9  . ONETOUCH VERIO test strip CHECK BLOOD SUGAR UP TO 4 TIMES A DAY AS DIRECTED.  Marland Kitchen propranolol (INDERAL) 20 MG tablet Take 20 mg by mouth. One in the morning, one in the afternoon PRN  . ranitidine (ZANTAC) 150 MG tablet Take 1 tablet (150 mg total) by mouth 2 (two) times daily.  . rivaroxaban (XARELTO) 20 MG TABS tablet TAKE 1 TABLET DAILY WITH SUPPER: Blood thiner (Patient taking differently: 10 mg daily. TAKE 1 TABLET DAILY WITH SUPPER: Blood thiner)  . Vitamin D, Ergocalciferol, (DRISDOL) 50000 units  CAPS capsule Take 50,000 Units by mouth every other day. Sunday, Wednesday    No facility-administered encounter medications on file as of 08/01/2017.     Past Medical History:  Diagnosis Date  . Anxiety   . Asthma   . Depression   . Diabetes mellitus type 2 in obese ([place]) 06/11/2016  . Embolism - blood clot January 1997 & January 2017   Also had LLE DVT in 1997  . Hypertension   . Preeclampsia 10/10/2011   1999   . Sleep apnea     Past Surgical History:  Procedure Laterality Date  . KIDNEY STONE SURGERY      Social History   Socioeconomic History  . Marital status: Married    Spouse name: Not on file  . Number of children: Y  . Years of education: Not on file  . Highest education level: Not on file  Occupational History  . Occupation: admin assist    Comment: Pharmacist, community  Social Needs  . Financial resource strain: Not on file  . Food insecurity:    Worry: Not on file    Inability: Not on file  . Transportation needs:    Medical: Not on file    Non-medical: Not on file  Tobacco Use  . Smoking status: Former Smoker    Packs/day: 1.00    Years: 20.00    Pack years: 20.00    Last attempt to quit: 03/12/2004    Years since quitting: 13.3  . Smokeless tobacco: Never Used  Substance and Sexual Activity  . Alcohol use: Yes    Alcohol/week: 0.6 oz    Types: 1 Cans of beer per week    Comment: 1-2 times a week  . Drug use: No  . Sexual activity: Yes    Partners: Male    Birth control/protection: None  Lifestyle  . Physical activity:    Days per week: Not on file    Minutes per session: Not on file  . Stress: Not on file  Relationships  . Social connections:    Talks on phone: Not on file    Gets together: Not on file    Attends religious service: Not on file    Active member of club or organization: Not on file    Attends meetings of clubs or organizations: Not on file    Relationship status: Not on file  . Intimate partner violence:    Fear of  current or ex partner: Not on file    Emotionally abused: Not on file    Physically abused: Not on file    Forced sexual activity: Not on file  Other Topics Concern  . Not on file  Social History Narrative   Originally from [place]. Always lived in [place]. 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 No international travel recently. Previously has been  to [place] in May 1996. No mold exposure recently. No bird exposure. Does have multiple pets.     Family Status  Relation Name Status  . Father  Other  . Mother  Alive  . Sister  Alive  . MGM  (Not Specified)  . Mat Aunt  (Not Specified)  . Brother  Alive  . Daughter 2 Alive  . Son  Alive  . Neg Hx  (Not Specified)    Review of Systems A complete 10 system ROS was obtained and was negative apart from what is mentioned.   Objective:   VITALS:   Vitals:   08/01/17 0947  BP: 128/80  Pulse: (!) 102  SpO2: 95%  Weight: 243 lb (110.2 kg)  Height: 5' 8" (1.727 m)   Gen:  Appears stated age and in NAD. HEENT:  Normocephalic, atraumatic. The mucous membranes are moist. The superficial temporal arteries are without ropiness or tenderness. Cardiovascular: tachycardic.  Regular. Lungs: Clear to auscultation bilaterally. Neck: There are no carotid bruits noted bilaterally.  NEUROLOGICAL:  Orientation:  The patient is alert and oriented x 3.  Recent and remote memory are intact.  Attention span and concentration are normal.  Able to name objects and repeat without trouble.  Fund of knowledge is appropriate Cranial nerves: There is good facial symmetry. The pupils are equal round and reactive to light bilaterally. Fundoscopic exam reveals clear disc margins bilaterally. Extraocular muscles are intact and visual fields are full to confrontational testing. Speech is fluent and clear. Soft palate rises symmetrically and there is no tongue deviation. Hearing is intact to conversational tone. Tone: Tone is good throughout. Sensation: Sensation  is intact to light touch and pinprick throughout (facial, trunk, extremities). Vibration is intact at the bilateral big toe. There is no extinction with double simultaneous stimulation. There is no sensory dermatomal level identified. Coordination:  The patient has no dysdiadichokinesia or dysmetria. Motor: Strength is 5/5 in the bilateral upper and lower extremities.  Shoulder shrug is equal bilaterally.  There is no pronator drift.  There are no fasciculations noted. DTR's: Deep tendon reflexes are 2/4 at the bilateral biceps, triceps, brachioradialis, 1/4 at the bilateral patella and achilles.  Plantar responses are downgoing bilaterally. Gait and Station: The patient is able to ambulate without difficulty but reports legs feel weak when doing so.  When asked to ambulate in a tandem fashion, she steadies herself on the wall.  She is able to stand in the Romberg position with eyes open and closed.    MOVEMENT EXAM: Tremor:  There is no rest tremor.  There is no postural tremor.  There is no intention tremor.  No tremor noted with Archimedes spirals.  She is able to pour water from one glass to another without spilling it.  Lab Results  Component Value Date   TSH 3.81 02/22/2017     Chemistry      Component Value Date/Time   NA 142 06/05/2017 1040   NA 140 12/11/2016 1236   K 4.6 06/05/2017 1040   K 4.2 12/11/2016 1236   CL 106 06/05/2017 1040   CO2 25 06/05/2017 1040   CO2 24 12/11/2016 1236   BUN 11 06/05/2017 1040   BUN 15.1 12/11/2016 1236   CREATININE 0.57 06/05/2017 1040   CREATININE 0.8 12/11/2016 1236      Component Value Date/Time   CALCIUM 9.8 06/05/2017 1040   CALCIUM 10.0 12/11/2016 1236   ALKPHOS 70 03/21/2017 1210   ALKPHOS 71 12/11/2016 1236  AST 28 06/05/2017 1040   AST 38 (H) 12/11/2016 1236   ALT 39 (H) 06/05/2017 1040   ALT 61 (H) 12/11/2016 1236   BILITOT 0.4 06/05/2017 1040   BILITOT 0.62 12/11/2016 1236         Assessment/Plan:   1.  Tremor by  history  -I do not see any evidence of essential tremor today.  Nor do I see evidence of a neurodegenerative process causing tremor.  I actually did not see any tremor today, but by history suspect that it could be due to lithium, as she describes a lot of "inner tremor."   Studies are varied, but incidate that up to 2/3 of patients on lithium will have lithium-induced tremor, even if the patients are not toxic on the medication.  This is just a common side effect of patients on lithium.  I did not recommend that she stop or taper the lithium, but rather talk to her prescribing physician about this.  I did tell her that in my experience, lithium-induced tremor does not respond well to attempts at treating it with other medications.  I wouldn't recommend treating hers anyway given that I could not visually see the tremor   2.  Possible myoclonus  -She describes what sounds like myoclonus.  Again, I did not see this today.  This is, however, a common side effect with Lyrica.  I did not recommend that she change this.  Told her that this is often a dose-dependent side effect, so if the dose increases, the symptoms may increase.  Similarly, if the dose is lowered, symptoms may get better.  She will talk with her primary care physician.  3.  B12 deficiency  -Had her B12 level checked at [facility] in July, 2018 and was mildly low at 215.  Recommended oral B12 supplement, 1000 mcg daily.  She will be starting B6 soon as psychiatry recommended.  4.  Perceived LE weakness  -She asked me about this.  I do not know the etiology.  Strength was 5/5 when tested.  She has already had a work-up by [facility] neurolog[facility].  Explained that I generally only see patients with tremor, and am unsure if the etiology of this symptom.  5.  Follow-up as needed.  CC:  Vivi Barrack, MD

## 2017-08-01 ENCOUNTER — Telehealth: Payer: Self-pay

## 2017-08-01 ENCOUNTER — Encounter: Payer: Self-pay | Admitting: Neurology

## 2017-08-01 ENCOUNTER — Ambulatory Visit: Payer: BLUE CROSS/BLUE SHIELD | Admitting: Neurology

## 2017-08-01 VITALS — BP 128/80 | HR 102 | Ht 68.0 in | Wt 243.0 lb

## 2017-08-01 DIAGNOSIS — E538 Deficiency of other specified B group vitamins: Secondary | ICD-10-CM

## 2017-08-01 DIAGNOSIS — G251 Drug-induced tremor: Secondary | ICD-10-CM

## 2017-08-01 DIAGNOSIS — T887XXA Unspecified adverse effect of drug or medicament, initial encounter: Secondary | ICD-10-CM | POA: Diagnosis not present

## 2017-08-01 DIAGNOSIS — G253 Myoclonus: Secondary | ICD-10-CM

## 2017-08-01 NOTE — Telephone Encounter (Signed)
PA for Lyrica approved through 07/30/2020.  Patient's pharmacy notified.

## 2017-08-01 NOTE — Progress Notes (Signed)
Note sent to [facility].

## 2017-08-04 DIAGNOSIS — I10 Essential (primary) hypertension: Secondary | ICD-10-CM | POA: Diagnosis not present

## 2017-08-04 DIAGNOSIS — G4733 Obstructive sleep apnea (adult) (pediatric): Secondary | ICD-10-CM | POA: Diagnosis not present

## 2017-08-06 DIAGNOSIS — F319 Bipolar disorder, unspecified: Secondary | ICD-10-CM | POA: Diagnosis not present

## 2017-08-21 ENCOUNTER — Other Ambulatory Visit: Payer: Self-pay | Admitting: Family Medicine

## 2017-09-03 DIAGNOSIS — G4733 Obstructive sleep apnea (adult) (pediatric): Secondary | ICD-10-CM | POA: Diagnosis not present

## 2017-09-03 DIAGNOSIS — I10 Essential (primary) hypertension: Secondary | ICD-10-CM | POA: Diagnosis not present

## 2017-09-19 ENCOUNTER — Other Ambulatory Visit: Payer: Self-pay | Admitting: Family Medicine

## 2017-09-24 ENCOUNTER — Other Ambulatory Visit: Payer: Self-pay | Admitting: Family Medicine

## 2017-09-24 NOTE — Telephone Encounter (Signed)
Ok with me. Please place any necessary orders. 

## 2017-09-24 NOTE — Telephone Encounter (Signed)
Please advise.  Historical provider. 

## 2017-09-25 NOTE — Telephone Encounter (Signed)
Rx has been sent to pharmacy

## 2017-09-26 ENCOUNTER — Other Ambulatory Visit: Payer: Self-pay

## 2017-09-26 ENCOUNTER — Encounter: Payer: Self-pay | Admitting: Family Medicine

## 2017-09-26 MED ORDER — DICLOFENAC SODIUM 75 MG PO TBEC: 75.0000 mg | DELAYED_RELEASE_TABLET | Freq: Two times a day (BID) | ORAL | 0 refills | Status: DC

## 2017-09-30 DIAGNOSIS — F319 Bipolar disorder, unspecified: Secondary | ICD-10-CM | POA: Diagnosis not present

## 2017-10-03 ENCOUNTER — Encounter: Payer: Self-pay | Admitting: Family Medicine

## 2017-10-03 NOTE — Telephone Encounter (Signed)
Dr. Durene CalHunter, please see message and advise due to Dr. Jimmey RalphParker out till Tuesday.

## 2017-10-07 DIAGNOSIS — G4733 Obstructive sleep apnea (adult) (pediatric): Secondary | ICD-10-CM | POA: Diagnosis not present

## 2017-10-07 DIAGNOSIS — I10 Essential (primary) hypertension: Secondary | ICD-10-CM | POA: Diagnosis not present

## 2017-10-10 ENCOUNTER — Ambulatory Visit: Payer: BLUE CROSS/BLUE SHIELD | Admitting: Family Medicine

## 2017-10-10 ENCOUNTER — Encounter: Payer: Self-pay | Admitting: Family Medicine

## 2017-10-10 VITALS — BP 126/74 | HR 110 | Temp 98.9°F | Ht 68.0 in | Wt 254.0 lb

## 2017-10-10 DIAGNOSIS — E213 Hyperparathyroidism, unspecified: Secondary | ICD-10-CM

## 2017-10-10 DIAGNOSIS — I2699 Other pulmonary embolism without acute cor pulmonale: Secondary | ICD-10-CM | POA: Diagnosis not present

## 2017-10-10 DIAGNOSIS — F319 Bipolar disorder, unspecified: Secondary | ICD-10-CM

## 2017-10-10 DIAGNOSIS — R52 Pain, unspecified: Secondary | ICD-10-CM | POA: Diagnosis not present

## 2017-10-10 DIAGNOSIS — Z5181 Encounter for therapeutic drug level monitoring: Secondary | ICD-10-CM

## 2017-10-10 DIAGNOSIS — E1169 Type 2 diabetes mellitus with other specified complication: Secondary | ICD-10-CM

## 2017-10-10 DIAGNOSIS — E559 Vitamin D deficiency, unspecified: Secondary | ICD-10-CM

## 2017-10-10 DIAGNOSIS — F332 Major depressive disorder, recurrent severe without psychotic features: Secondary | ICD-10-CM

## 2017-10-10 DIAGNOSIS — E669 Obesity, unspecified: Secondary | ICD-10-CM | POA: Diagnosis not present

## 2017-10-10 DIAGNOSIS — D6859 Other primary thrombophilia: Secondary | ICD-10-CM

## 2017-10-10 LAB — POCT GLYCOSYLATED HEMOGLOBIN (HGB A1C): Hemoglobin A1C: 5.4 % (ref 4.0–5.6)

## 2017-10-10 LAB — MICROALBUMIN / CREATININE URINE RATIO
Creatinine,U: 85.5 mg/dL
Microalb Creat Ratio: 0.9 mg/g (ref 0.0–30.0)
Microalb, Ur: 0.7 mg/dL (ref 0.0–1.9)

## 2017-10-10 LAB — CBC
HCT: 42.5 % (ref 36.0–46.0)
Hemoglobin: 14.2 g/dL (ref 12.0–15.0)
MCHC: 33.3 g/dL (ref 30.0–36.0)
MCV: 91.4 fl (ref 78.0–100.0)
Platelets: 304 10*3/uL (ref 150.0–400.0)
RBC: 4.65 Mil/uL (ref 3.87–5.11)
RDW: 13.9 % (ref 11.5–15.5)
WBC: 9.7 10*3/uL (ref 4.0–10.5)

## 2017-10-10 LAB — VITAMIN D 25 HYDROXY (VIT D DEFICIENCY, FRACTURES): VITD: 80.59 ng/mL (ref 30.00–100.00)

## 2017-10-10 MED ORDER — PREGABALIN 75 MG PO CAPS: 225.0000 mg | ORAL_CAPSULE | Freq: Two times a day (BID) | ORAL | 2 refills | Status: DC

## 2017-10-10 NOTE — Patient Instructions (Signed)
It was very nice to see you today!  We will increase your Lyrica to 225 mg twice daily.  Please do this for 1 to 2 weeks.  You can then increase to 300 mg twice daily.  Please let me know if your symptoms do not improve and we can try starting Mobic.  Your blood sugar looks very good today.  Please keep up the good work!  We will check blood work.  Please look into getting your pneumonia shot.  Please also look into colon cancer screening, you will be due for this soon.  Please come back in 3-6 months, or sooner as needed.  Take care, Dr [name]   Colorectal Cancer Screening Colorectal cancer screening is a group of tests used to check for colorectal cancer. Colorectal refers to your colon and rectum. Your colon and rectum are located at the end of your large intestine and carry your bowel movements out of your body. Why is colorectal cancer screening done? It is common for abnormal growths (polyps) to form in the lining of your colon, especially as you get older. These polyps can be cancerous or become cancerous. If colorectal cancer is found at an early stage, it is treatable. Who should be screened for colorectal cancer? Screening is recommended for all adults at average risk starting at age 75. Tests may be recommended every 1 to 10 years. Your health care provider may recommend earlier or more frequent screening if you have:  A history of colorectal cancer or polyps.  A family member with a history of colorectal cancer or polyps.  Inflammatory bowel disease, such as ulcerative colitis or Crohn disease.  A type of hereditary colon cancer syndrome.  Colorectal cancer symptoms.  Types of screening tests There are several types of colorectal screening tests. They include:  Guaiac-based fecal occult blood testing.  Fecal immunochemical test (FIT).  Stool DNA test.  Barium enema.  Virtual colonoscopy.  Sigmoidoscopy. During this test, a sigmoidoscope is used to examine  your rectum and lower colon. A sigmoidoscope is a flexible tube with a camera that is inserted through your anus into your rectum and lower colon.  Colonoscopy. During this test, a colonoscope is used to examine your entire colon. A colonoscope is a long, thin, flexible tube with a camera. This test examines your entire colon and rectum.  This information is not intended to replace advice given to you by your health care provider. Make sure you discuss any questions you have with your health care provider. Document Released: 08/16/2009 Document Revised: 10/06/2015 Document Reviewed: 06/04/2013 Elsevier Interactive Patient Education  2018 Elsevier Inc.   Pneumococcal Polysaccharide Vaccine: What You Need to Know 1. Why get vaccinated? Vaccination can protect older adults (and some children and younger adults) from pneumococcal disease. Pneumococcal disease is caused by bacteria that can spread from person to person through close contact. It can cause ear infections, and it can also lead to more serious infections of the:  Lungs (pneumonia),  Blood (bacteremia), and  Covering of the brain and spinal cord (meningitis). Meningitis can cause deafness and brain damage, and it can be fatal.  Anyone can get pneumococcal disease, but children under 41 years of age, people with certain medical conditions, adults over 39 years of age, and cigarette smokers are at the highest risk. About 18,000 older adults die each year from pneumococcal disease in the [place]. Treatment of pneumococcal infections with penicillin and other drugs used to be more effective. But some strains  of the disease have become resistant to these drugs. This makes prevention of the disease, through vaccination, even more important. 2. Pneumococcal polysaccharide vaccine (PPSV23) Pneumococcal polysaccharide vaccine (PPSV23) protects against 23 types of pneumococcal bacteria. It will not prevent all pneumococcal disease. PPSV23  is recommended for:  All adults 49 years of age and older,  Anyone 2 through 50 years of age with certain long-term health problems,  Anyone 2 through 50 years of age with a weakened immune system,  Adults 65 through 50 years of age who smoke cigarettes or have asthma.  Most people need only one dose of PPSV. A second dose is recommended for certain high-risk groups. People 20 and older should get a dose even if they have gotten one or more doses of the vaccine before they turned 65. Your healthcare provider can give you more information about these recommendations. Most healthy adults develop protection within 2 to 3 weeks of getting the shot. 3. Some people should not get this vaccine  Anyone who has had a life-threatening allergic reaction to PPSV should not get another dose.  Anyone who has a severe allergy to any component of PPSV should not receive it. Tell your provider if you have any severe allergies.  Anyone who is moderately or severely ill when the shot is scheduled may be asked to wait until they recover before getting the vaccine. Someone with a mild illness can usually be vaccinated.  Children less than 24 years of age should not receive this vaccine.  There is no evidence that PPSV is harmful to either a pregnant woman or to her fetus. However, as a precaution, women who need the vaccine should be vaccinated before becoming pregnant, if possible. 4. Risks of a vaccine reaction With any medicine, including vaccines, there is a chance of side effects. These are usually mild and go away on their own, but serious reactions are also possible. About half of people who get PPSV have mild side effects, such as redness or pain where the shot is given, which go away within about two days. Less than 1 out of 100 people develop a fever, muscle aches, or more severe local reactions. Problems that could happen after any vaccine:  People sometimes faint after a medical procedure,  including vaccination. Sitting or lying down for about 15 minutes can help prevent fainting, and injuries caused by a fall. Tell your doctor if you feel dizzy, or have vision changes or ringing in the ears.  Some people get severe pain in the shoulder and have difficulty moving the arm where a shot was given. This happens very rarely.  Any medication can cause a severe allergic reaction. Such reactions from a vaccine are very rare, estimated at about 1 in a million doses, and would happen within a few minutes to a few hours after the vaccination. As with any medicine, there is a very remote chance of a vaccine causing a serious injury or death. The safety of vaccines is always being monitored. For more information, visit: http://floyd.org/ 5. What if there is a serious reaction? What should I look for? Look for anything that concerns you, such as signs of a severe allergic reaction, very high fever, or unusual behavior. Signs of a severe allergic reaction can include hives, swelling of the face and throat, difficulty breathing, a fast heartbeat, dizziness, and weakness. These would usually start a few minutes to a few hours after the vaccination. What should I do? If you think it is  a severe allergic reaction or other emergency that can't wait, call 9-1-1 or get to the nearest hospital. Otherwise, call your doctor. Afterward, the reaction should be reported to the Vaccine Adverse Event Reporting System (VAERS). Your doctor might file this report, or you can do it yourself through the VAERS web site at www.vaers.LAgents.nohhs.gov, or by calling 1-651-882-0378. VAERS does not give medical advice. 6. How can I learn more?  Ask your doctor. He or she can give you the vaccine package insert or suggest other sources of information.  Call your local or state health department.  Contact the Centers for Disease Control and Prevention (CDC): ? Call 314-534-82481-701 398 5500 (1-800-CDC-INFO) or ? Visit CDC's website  at PicCapture.uywww.cdc.gov/vaccines CDC Pneumococcal Polysaccharide Vaccine VIS (07/03/13) This information is not intended to replace advice given to you by your health care provider. Make sure you discuss any questions you have with your health care provider. Document Released: 12/24/2005 Document Revised: 11/17/2015 Document Reviewed: 11/17/2015 Elsevier Interactive Patient Education  2017 [employer]

## 2017-10-10 NOTE — Assessment & Plan Note (Signed)
Stable on Xarelto.  Check CBC today.

## 2017-10-10 NOTE — Assessment & Plan Note (Signed)
Check vitamin D level today 

## 2017-10-10 NOTE — Assessment & Plan Note (Signed)
Stable.  Discussed treatment options with patient.  We will increase her dose of Lyrica to 225 mg twice daily and titrate to 300 mg twice daily as tolerated.  She has tried Celebrex and diclofenac which have not worked for her.  Would consider trial of Mobic if symptoms do not improve with Lyrica.  She is nearing the end of her nonnarcotic options.  Would consider referral to pain specialist if no improvement with above measures.  She will follow-up with me in 3 to 6 months.

## 2017-10-10 NOTE — Assessment & Plan Note (Signed)
Stable. Check lithium and nortriptyline level today. Will titrate up lyrica dose as noted above. Defer further management to psychiatry.

## 2017-10-10 NOTE — Assessment & Plan Note (Signed)
Stable.  Follows with psychiatry.  Will check lithium and nortriptyline level today per patient request.

## 2017-10-10 NOTE — Assessment & Plan Note (Signed)
BMI 38 today with comorbidities.  Overall, her weight is stable.  Will continue metformin and Trulicity which would help with this.

## 2017-10-10 NOTE — Assessment & Plan Note (Signed)
A1c improved to 5.4 today.  Continue metformin 1000 mg twice daily and Trulicity 0.75 mg weekly.  Follow-up in 3 to 6 months.  Foot exam performed today.  Check urine microalbumin today.  Patient declined pneumonia vaccine.

## 2017-10-10 NOTE — Assessment & Plan Note (Signed)
Stable on Xarelto.  Check CBC.

## 2017-10-10 NOTE — Progress Notes (Signed)
Subjective:  Deborah Soto is a 52 y.o. female who presents today with a chief complaint of T2DM follow up.   HPI:  T2DM, Chronic problem, stable Last seen about 2.5 months ago for this. She is currently on metformin 100mg  twice daily and trulicity 0.75mg  weekly. She has done well without reported side effects.  Diffuse Pain, chronic problem, stable Patient seen 2.5 months ago for this. At that point she was doing very well on celebrex. Unfortunately, the effects of this medication seemed to wear off and she stopped taking it. We switched her to diclofenac and she has not done well with this either.  Pain is all overall stable.  She is seeing a rheumatologist and is on Plaquenil.  Vitamin D deficiency/hyperparathyroidism, chronic problem, stable Follows with endocrinology for this.  On vitamin D 50,000 international units every other day.  Vitamin B12 deficiency, chronic problem, stable Currently on oral replacement 1000 mg daily.  She is only been on this for about a month and frequently forgets to take her medication.  Bipolar/depression/anxiety, chronic problems, stable Followed by psychiatry for this.  Currently on lithium 300 mg 3 times daily and nortriptyline 200 mg daily.  Request blood work to be done on both these today.  ROS: Per HPI  PMH: She reports that she quit smoking about 13 years ago. She has a 20.00 pack-year smoking history. She has never used smokeless tobacco. She reports that she drinks about 0.6 oz of alcohol per week. She reports that she does not use drugs.  Objective:  Physical Exam: BP 126/74 (BP Location: Left Arm, Patient Position: Sitting, Cuff Size: Large)   Pulse (!) 110   Temp 98.9 F (37.2 C) (Oral)   Ht 5\' 8"  (1.727 m)   Wt 254 lb (115.2 kg)   LMP  (LMP Unknown) Comment: last period ---  1 year ago (?)  SpO2 95%   BMI 38.62 kg/m   Wt Readings from Last 3 Encounters:  10/10/17 254 lb (115.2 kg)  08/01/17 243 lb (110.2 kg)  07/24/17 244  lb (110.7 kg)   Gen: NAD, resting comfortably CV: RRR with no murmurs appreciated Pulm: NWOB, CTAB with no crackles, wheezes, or rhonchi GI: Normal bowel sounds present. Soft, Nontender, Nondistended. MSK: No edema, cyanosis, or clubbing noted Diabetic Foot Exam - Simple   Simple Foot Form Diabetic Foot exam was performed with the following findings:  Yes 10/10/2017 10:11 AM  Visual Inspection No deformities, no ulcerations, no other skin breakdown bilaterally:  Yes Sensation Testing Intact to touch and monofilament testing bilaterally:  Yes Pulse Check Posterior Tibialis and Dorsalis pulse intact bilaterally:  Yes Comments    Assessment/Plan:  PTE (pulmonary thromboembolism) (HCC) Stable on Xarelto.  Check CBC today.  Primary hypercoagulable state (HCC) Stable on Xarelto.  Check CBC.  Morbid obesity (HCC) BMI 38 today with comorbidities.  Overall, her weight is stable.  Will continue metformin and Trulicity which would help with this.  Diffuse pain Stable.  Discussed treatment options with patient.  We will increase her dose of Lyrica to 225 mg twice daily and titrate to 300 mg twice daily as tolerated.  She has tried Celebrex and diclofenac which have not worked for her.  Would consider trial of Mobic if symptoms do not improve with Lyrica.  She is nearing the end of her nonnarcotic options.  Would consider referral to pain specialist if no improvement with above measures.  She will follow-up with me in 3 to 6 months.  Diabetes mellitus type 2 in obese (HCC) A1c improved to 5.4 today.  Continue metformin 1000 mg twice daily and Trulicity 0.75 mg weekly.  Follow-up in 3 to 6 months.  Foot exam performed today.  Check urine microalbumin today.  Patient declined pneumonia vaccine.  Hyperparathyroidism (HCC) Check vitamin D level today.  MDD (major depressive disorder), recurrent severe, without psychosis (HCC) Stable.  Follows with psychiatry.  Will check lithium and nortriptyline  level today per patient request.  Bipolar affective disorder (HCC) Stable. Check lithium and nortriptyline level today. Will titrate up lyrica dose as noted above. Defer further management to psychiatry.   Preventive healthcare Recommended pneumonia vaccine today however patient deferred.  She will look into her colon cancer screening options as she will be turning 52 in approximately 6 weeks.  [name], MD 10/10/2017 11:06 AM

## 2017-10-13 LAB — LITHIUM LEVEL: Lithium Lvl: 0.7 mmol/L (ref 0.6–1.2)

## 2017-10-13 LAB — NORTRIPTYLINE LEVEL: Nortriptyline Lvl: 151 mcg/L — ABNORMAL HIGH (ref 50–150)

## 2017-10-14 NOTE — Progress Notes (Signed)
Dr Lavone NeriParker's interpretation of your lab work:  Your blood counts are normal. Your urine test is normal without any signs of kidney damage. Your vitamin D level is within the normal range - please continue your current dose of vitamin D.  Your lithium level and nortriptyline level are in appropriate ranges according to our lab.   We do not need to make any changes to your treatment plan at this time based on these results. Please come back to see me in 3-6 months, or sooner as needed.   If you have any additional questions, please give us a call or send us a message through [facility].  Take care, Dr Jimmey RalphParker

## 2017-10-21 ENCOUNTER — Other Ambulatory Visit: Payer: Self-pay | Admitting: Family Medicine

## 2017-10-30 DIAGNOSIS — R760 Raised antibody titer: Secondary | ICD-10-CM | POA: Diagnosis not present

## 2017-10-30 DIAGNOSIS — R52 Pain, unspecified: Secondary | ICD-10-CM | POA: Diagnosis not present

## 2017-10-30 DIAGNOSIS — R768 Other specified abnormal immunological findings in serum: Secondary | ICD-10-CM | POA: Diagnosis not present

## 2017-10-30 DIAGNOSIS — Z86711 Personal history of pulmonary embolism: Secondary | ICD-10-CM | POA: Diagnosis not present

## 2017-10-30 DIAGNOSIS — M797 Fibromyalgia: Secondary | ICD-10-CM | POA: Diagnosis not present

## 2017-11-05 ENCOUNTER — Ambulatory Visit (HOSPITAL_COMMUNITY): Payer: Self-pay | Admitting: Psychiatry

## 2017-11-06 DIAGNOSIS — M545 Low back pain: Secondary | ICD-10-CM | POA: Diagnosis not present

## 2017-11-06 DIAGNOSIS — M79604 Pain in right leg: Secondary | ICD-10-CM | POA: Diagnosis not present

## 2017-11-06 DIAGNOSIS — M797 Fibromyalgia: Secondary | ICD-10-CM | POA: Diagnosis not present

## 2017-11-06 DIAGNOSIS — M79605 Pain in left leg: Secondary | ICD-10-CM | POA: Diagnosis not present

## 2017-11-13 DIAGNOSIS — M545 Low back pain: Secondary | ICD-10-CM | POA: Diagnosis not present

## 2017-11-13 DIAGNOSIS — M79605 Pain in left leg: Secondary | ICD-10-CM | POA: Diagnosis not present

## 2017-11-13 DIAGNOSIS — M79604 Pain in right leg: Secondary | ICD-10-CM | POA: Diagnosis not present

## 2017-11-13 DIAGNOSIS — M797 Fibromyalgia: Secondary | ICD-10-CM | POA: Diagnosis not present

## 2017-11-15 DIAGNOSIS — M797 Fibromyalgia: Secondary | ICD-10-CM | POA: Diagnosis not present

## 2017-11-15 DIAGNOSIS — M79604 Pain in right leg: Secondary | ICD-10-CM | POA: Diagnosis not present

## 2017-11-15 DIAGNOSIS — M79605 Pain in left leg: Secondary | ICD-10-CM | POA: Diagnosis not present

## 2017-11-15 DIAGNOSIS — M545 Low back pain: Secondary | ICD-10-CM | POA: Diagnosis not present

## 2017-11-19 DIAGNOSIS — M79605 Pain in left leg: Secondary | ICD-10-CM | POA: Diagnosis not present

## 2017-11-19 DIAGNOSIS — M797 Fibromyalgia: Secondary | ICD-10-CM | POA: Diagnosis not present

## 2017-11-19 DIAGNOSIS — M545 Low back pain: Secondary | ICD-10-CM | POA: Diagnosis not present

## 2017-11-19 DIAGNOSIS — M79604 Pain in right leg: Secondary | ICD-10-CM | POA: Diagnosis not present

## 2017-11-21 ENCOUNTER — Other Ambulatory Visit: Payer: Self-pay | Admitting: Family Medicine

## 2017-11-21 DIAGNOSIS — M797 Fibromyalgia: Secondary | ICD-10-CM | POA: Diagnosis not present

## 2017-11-21 DIAGNOSIS — M79604 Pain in right leg: Secondary | ICD-10-CM | POA: Diagnosis not present

## 2017-11-21 DIAGNOSIS — M545 Low back pain: Secondary | ICD-10-CM | POA: Diagnosis not present

## 2017-11-21 DIAGNOSIS — M79605 Pain in left leg: Secondary | ICD-10-CM | POA: Diagnosis not present

## 2017-11-25 ENCOUNTER — Encounter: Payer: Self-pay | Admitting: Family Medicine

## 2017-11-25 DIAGNOSIS — M797 Fibromyalgia: Secondary | ICD-10-CM | POA: Diagnosis not present

## 2017-11-25 DIAGNOSIS — M545 Low back pain: Secondary | ICD-10-CM | POA: Diagnosis not present

## 2017-11-25 DIAGNOSIS — M79604 Pain in right leg: Secondary | ICD-10-CM | POA: Diagnosis not present

## 2017-11-25 DIAGNOSIS — M79605 Pain in left leg: Secondary | ICD-10-CM | POA: Diagnosis not present

## 2017-11-26 DIAGNOSIS — F319 Bipolar disorder, unspecified: Secondary | ICD-10-CM | POA: Diagnosis not present

## 2017-11-27 DIAGNOSIS — M79604 Pain in right leg: Secondary | ICD-10-CM | POA: Diagnosis not present

## 2017-11-27 DIAGNOSIS — M545 Low back pain: Secondary | ICD-10-CM | POA: Diagnosis not present

## 2017-11-27 DIAGNOSIS — M797 Fibromyalgia: Secondary | ICD-10-CM | POA: Diagnosis not present

## 2017-11-27 DIAGNOSIS — M79605 Pain in left leg: Secondary | ICD-10-CM | POA: Diagnosis not present

## 2017-12-02 DIAGNOSIS — M797 Fibromyalgia: Secondary | ICD-10-CM | POA: Diagnosis not present

## 2017-12-02 DIAGNOSIS — M79604 Pain in right leg: Secondary | ICD-10-CM | POA: Diagnosis not present

## 2017-12-02 DIAGNOSIS — I10 Essential (primary) hypertension: Secondary | ICD-10-CM | POA: Diagnosis not present

## 2017-12-02 DIAGNOSIS — M545 Low back pain: Secondary | ICD-10-CM | POA: Diagnosis not present

## 2017-12-02 DIAGNOSIS — G4733 Obstructive sleep apnea (adult) (pediatric): Secondary | ICD-10-CM | POA: Diagnosis not present

## 2017-12-04 DIAGNOSIS — M545 Low back pain: Secondary | ICD-10-CM | POA: Diagnosis not present

## 2017-12-04 DIAGNOSIS — M79604 Pain in right leg: Secondary | ICD-10-CM | POA: Diagnosis not present

## 2017-12-09 DIAGNOSIS — M79604 Pain in right leg: Secondary | ICD-10-CM | POA: Diagnosis not present

## 2017-12-09 DIAGNOSIS — M797 Fibromyalgia: Secondary | ICD-10-CM | POA: Diagnosis not present

## 2017-12-09 DIAGNOSIS — M545 Low back pain: Secondary | ICD-10-CM | POA: Diagnosis not present

## 2017-12-11 DIAGNOSIS — M545 Low back pain: Secondary | ICD-10-CM | POA: Diagnosis not present

## 2017-12-11 DIAGNOSIS — M79604 Pain in right leg: Secondary | ICD-10-CM | POA: Diagnosis not present

## 2017-12-11 DIAGNOSIS — M797 Fibromyalgia: Secondary | ICD-10-CM | POA: Diagnosis not present

## 2017-12-16 ENCOUNTER — Other Ambulatory Visit: Payer: Self-pay | Admitting: Psychiatry

## 2017-12-16 ENCOUNTER — Other Ambulatory Visit: Payer: Self-pay | Admitting: Family Medicine

## 2017-12-26 DIAGNOSIS — E119 Type 2 diabetes mellitus without complications: Secondary | ICD-10-CM | POA: Diagnosis not present

## 2017-12-26 LAB — HM DIABETES EYE EXAM

## 2017-12-27 DIAGNOSIS — I10 Essential (primary) hypertension: Secondary | ICD-10-CM | POA: Diagnosis not present

## 2017-12-27 DIAGNOSIS — G4733 Obstructive sleep apnea (adult) (pediatric): Secondary | ICD-10-CM | POA: Diagnosis not present

## 2018-01-02 ENCOUNTER — Encounter: Payer: Self-pay | Admitting: Physical Therapy

## 2018-01-05 DIAGNOSIS — G4733 Obstructive sleep apnea (adult) (pediatric): Secondary | ICD-10-CM | POA: Diagnosis not present

## 2018-01-05 DIAGNOSIS — I10 Essential (primary) hypertension: Secondary | ICD-10-CM | POA: Diagnosis not present

## 2018-01-10 ENCOUNTER — Ambulatory Visit: Payer: BLUE CROSS/BLUE SHIELD | Admitting: Family Medicine

## 2018-01-10 ENCOUNTER — Encounter: Payer: Self-pay | Admitting: Family Medicine

## 2018-01-10 VITALS — BP 124/70 | HR 113 | Temp 98.7°F | Ht 68.0 in | Wt 261.6 lb

## 2018-01-10 DIAGNOSIS — L709 Acne, unspecified: Secondary | ICD-10-CM

## 2018-01-10 DIAGNOSIS — L03211 Cellulitis of face: Secondary | ICD-10-CM

## 2018-01-10 DIAGNOSIS — R52 Pain, unspecified: Secondary | ICD-10-CM | POA: Diagnosis not present

## 2018-01-10 DIAGNOSIS — Z23 Encounter for immunization: Secondary | ICD-10-CM

## 2018-01-10 MED ORDER — DOXYCYCLINE HYCLATE 100 MG PO TABS: 100.0000 mg | ORAL_TABLET | Freq: Two times a day (BID) | ORAL | 0 refills | Status: DC

## 2018-01-10 MED ORDER — PANTOPRAZOLE SODIUM 40 MG PO TBEC: 40.0000 mg | DELAYED_RELEASE_TABLET | Freq: Every day | ORAL | 3 refills | Status: DC

## 2018-01-10 MED ORDER — CELECOXIB 200 MG PO CAPS: 200.0000 mg | ORAL_CAPSULE | Freq: Two times a day (BID) | ORAL | 2 refills | Status: DC

## 2018-01-10 MED ORDER — ADAPALENE 0.3 % EX GEL: CUTANEOUS | 0 refills | Status: DC

## 2018-01-10 MED ORDER — CLINDAMYCIN PHOSPHATE 1 % EX GEL: Freq: Two times a day (BID) | CUTANEOUS | 0 refills | Status: DC

## 2018-01-10 NOTE — Assessment & Plan Note (Addendum)
Facial rash consistent with acne.  Start topical Clindagel and Differin.

## 2018-01-10 NOTE — Assessment & Plan Note (Signed)
Had a lengthy discussion with patient regarding treatment options at this point.  She has had some benefit with Celebrex in the past.  We will restart this today in addition to daily Protonix.  Patient does not wish to start narcotics

## 2018-01-10 NOTE — Patient Instructions (Signed)
It was very nice to see you today!  I think you have an infection your ear.  Please start the doxycycline.  Please start Clindagel and Differin cream for your facial rash as well.  We will restart Celebrex today.  I will also send an acid blocker medication called Protonix.  We will give your flu shot today.  Clinic to see me in 3 to 6 months, or sooner as needed.  Take care, Dr [name]

## 2018-01-10 NOTE — Progress Notes (Signed)
   Subjective:  Deborah Soto is a 52 y.o. female who presents today with a chief complaint of ear pain.   HPI:  Ear pain Started 2 days ago.  Located on right earlobe. Patient noticed a cyst to the area.  She attempted to pop this was sterilized needle.  Has had redness and swelling to the area.  No fevers.  No other treatments tried.  Patient rash Started a few weeks ago.  Located on bilateral cheeks.  She has had several pustules and dark spots there as well.  She has tried using leftover benzoyl peroxide-clindamycin ointment with no improvement.  Diffuse pain Recently saw her rheumatologist who took her off Plaquenil as it was not helping.  Told her that she had fibromyalgia.  Patient is still concerned about possible autoimmune etiology.  She has been working with physical therapy, but has not found it to be particularly helpful.  ROS: Per HPI  PMH: She reports that she quit smoking about 13 years ago. She has a 20.00 pack-year smoking history. She has never used smokeless tobacco. She reports that she drinks about 1.0 standard drinks of alcohol per week. She reports that she does not use drugs.  Objective:  Physical Exam: BP 124/70 (BP Location: Left Arm, Patient Position: Sitting, Cuff Size: Normal)   Pulse (!) 113   Temp 98.7 F (37.1 C) (Oral)   Ht 5\' 8"  (1.727 m)   Wt 261 lb 9.6 oz (118.7 kg)   LMP  (LMP Unknown) Comment: last period ---  1 year ago (?)  SpO2 96%   BMI 39.78 kg/m   Wt Readings from Last 3 Encounters:  01/10/18 261 lb 9.6 oz (118.7 kg)  10/10/17 254 lb (115.2 kg)  08/01/17 243 lb (110.2 kg)    Gen: NAD, resting comfortably HEENT: Right pinna significantly erythematous and edematous.  No areas of fluctuance noted.  Pustular rash noted on bilateral jawline.  CV: RRR with no murmurs appreciated Pulm: NWOB, CTAB with no crackles, wheezes, or rhonchi  Assessment/Plan:  Diffuse pain Had a lengthy discussion with patient regarding treatment options at  this point.  She has had some benefit with Celebrex in the past.  We will restart this today in addition to daily Protonix.  Patient does not wish to start narcotics  Acne Facial rash consistent with acne.  Start topical Clindagel and Differin.  Cellulitis No red flags.  No areas amenable to I&D today.  Start 1 week course of doxycycline.  Discussed reasons to return to care.  Preventive health care Flu shot given today.  Katina Degree. [name], MD 01/10/2018 5:23 PM

## 2018-01-13 ENCOUNTER — Ambulatory Visit: Payer: BLUE CROSS/BLUE SHIELD | Admitting: Family Medicine

## 2018-01-13 DIAGNOSIS — M9903 Segmental and somatic dysfunction of lumbar region: Secondary | ICD-10-CM | POA: Diagnosis not present

## 2018-01-13 DIAGNOSIS — M7918 Myalgia, other site: Secondary | ICD-10-CM | POA: Diagnosis not present

## 2018-01-13 DIAGNOSIS — M9904 Segmental and somatic dysfunction of sacral region: Secondary | ICD-10-CM | POA: Diagnosis not present

## 2018-01-17 ENCOUNTER — Other Ambulatory Visit: Payer: Self-pay | Admitting: Psychiatry

## 2018-01-17 ENCOUNTER — Other Ambulatory Visit: Payer: Self-pay | Admitting: Family Medicine

## 2018-01-17 DIAGNOSIS — M9905 Segmental and somatic dysfunction of pelvic region: Secondary | ICD-10-CM | POA: Diagnosis not present

## 2018-01-17 DIAGNOSIS — M9904 Segmental and somatic dysfunction of sacral region: Secondary | ICD-10-CM | POA: Diagnosis not present

## 2018-01-17 DIAGNOSIS — M7918 Myalgia, other site: Secondary | ICD-10-CM | POA: Diagnosis not present

## 2018-01-17 DIAGNOSIS — M9903 Segmental and somatic dysfunction of lumbar region: Secondary | ICD-10-CM | POA: Diagnosis not present

## 2018-01-21 ENCOUNTER — Ambulatory Visit: Payer: Self-pay | Admitting: Psychiatry

## 2018-01-27 DIAGNOSIS — M9903 Segmental and somatic dysfunction of lumbar region: Secondary | ICD-10-CM | POA: Diagnosis not present

## 2018-01-27 DIAGNOSIS — M9904 Segmental and somatic dysfunction of sacral region: Secondary | ICD-10-CM | POA: Diagnosis not present

## 2018-01-27 DIAGNOSIS — M9905 Segmental and somatic dysfunction of pelvic region: Secondary | ICD-10-CM | POA: Diagnosis not present

## 2018-01-27 DIAGNOSIS — M7918 Myalgia, other site: Secondary | ICD-10-CM | POA: Diagnosis not present

## 2018-02-04 DIAGNOSIS — I10 Essential (primary) hypertension: Secondary | ICD-10-CM | POA: Diagnosis not present

## 2018-02-04 DIAGNOSIS — G4733 Obstructive sleep apnea (adult) (pediatric): Secondary | ICD-10-CM | POA: Diagnosis not present

## 2018-02-17 ENCOUNTER — Other Ambulatory Visit: Payer: Self-pay | Admitting: Family Medicine

## 2018-02-17 ENCOUNTER — Other Ambulatory Visit: Payer: Self-pay | Admitting: Psychiatry

## 2018-02-17 NOTE — Telephone Encounter (Signed)
Need to review paper chart  

## 2018-02-27 ENCOUNTER — Ambulatory Visit: Payer: BLUE CROSS/BLUE SHIELD | Admitting: Family Medicine

## 2018-02-27 ENCOUNTER — Encounter: Payer: Self-pay | Admitting: Family Medicine

## 2018-02-27 VITALS — BP 124/80 | HR 105 | Temp 97.8°F | Ht 68.0 in | Wt 263.2 lb

## 2018-02-27 DIAGNOSIS — E1169 Type 2 diabetes mellitus with other specified complication: Secondary | ICD-10-CM

## 2018-02-27 DIAGNOSIS — M79671 Pain in right foot: Secondary | ICD-10-CM

## 2018-02-27 DIAGNOSIS — M79641 Pain in right hand: Secondary | ICD-10-CM

## 2018-02-27 DIAGNOSIS — E669 Obesity, unspecified: Secondary | ICD-10-CM | POA: Diagnosis not present

## 2018-02-27 LAB — POCT GLYCOSYLATED HEMOGLOBIN (HGB A1C): Hemoglobin A1C: 6.2 % — AB (ref 4.0–5.6)

## 2018-02-27 MED ORDER — DICLOFENAC SODIUM 1 % TD GEL: 4.0000 g | Freq: Four times a day (QID) | TRANSDERMAL | 3 refills | Status: DC | PRN

## 2018-02-27 NOTE — Progress Notes (Signed)
Dr Lavone NeriParker's interpretation of your lab work:  Your A1c is up just a little bit, but still very well controlled. I do not think this is causing your symptoms as we discussed at your appointment. I recommend continuing current doses of your diabetes meds. This should be rechecked again in about 6 months.    If you have any additional questions, please give us a call or send us a message through [place].  Take care, Dr Jimmey RalphParker

## 2018-02-27 NOTE — Progress Notes (Signed)
   Subjective:  Deborah Soto is a 52 y.o. female who presents today with a chief complaint of hand pain.   HPI:  Hand Pain, new problem Started about a month ago.  Symptoms are predominantly located on the dorsal aspect of her right radial wrist.  Symptoms occur randomly without clear precipitating event.  No obvious alleviating or aggravating factors.  Symptoms last for a few minutes and then goes away.  No trauma to the area.  No treatments tried.  Pain is described as a burning sensation.  Foot pain, new problem Also started about a month ago.  Located along the anterior aspect of her right medial malleolus.  As noted above, symptoms occur randomly without clear precipitating event.  No obvious alleviating or aggravating factors.  Pain is described as a burning and electrical-like sensation.  No trauma.  T2DM Currently on metformin 500 mg twice daily Trulicity 1.5 mg weekly.  Tolerating both these well.  Blood sugars have been somewhat elevated the last few weeks.  ROS: Per HPI  PMH: She reports that she quit smoking about 13 years ago. She has a 20.00 pack-year smoking history. She has never used smokeless tobacco. She reports current alcohol use of about 1.0 standard drinks of alcohol per week. She reports that she does not use drugs.  Objective:  Physical Exam: BP 124/80 (BP Location: Left Arm, Patient Position: Sitting, Cuff Size: Large)   Pulse 96   Temp 97.8 F (36.6 C) (Oral)   Ht 5\' 8"  (1.727 m)   Wt 263 lb 3.2 oz (119.4 kg)   LMP  (LMP Unknown) Comment: last period ---  1 year ago (?)  BMI 40.02 kg/m   Gen: NAD, resting comfortably CV: RRR with no murmurs appreciated Pulm: NWOB, CTAB with no crackles, wheezes, or rhonchi MSK:  -Neck: No deformities.  Full range of motion.  Spurling test negative bilaterally. -Right hand: No deformities.  Tinel sign negative.  Phalen test negative.  No tender to palpation.  Full strength and range of motion throughout.  Neurovascular  intact distally. -Right ankle: No deformities.  Nontender to palpation.  Tinel sign negative at medial malleolus.  Neurovascular intact distally.  Strength 5 out of 5 throughout.  Assessment/Plan:  Right hand pain/right foot pain Pain appears to be neuropathic type based on her history.  Her physical exam is without obvious abnormality.  No signs of carpal tunnel or tarsal tunnel syndrome.  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  She has not had a lumbar MRI that I can see, however it is reasonable that she has similar pathology in her lumbar spine as well.  I think that this is the most likely explanation for her neuropathic pain.  Her A1c is at goal today.  Recently had normal TSH, CBC, and electrolytes/renal function.  She will follow-up with sports medicine for further evaluation.  Discussed potential referral to orthopedics or neurosurgery, however patient is not wish to have surgery at this point.  We will start her on diclofenac gel for her pain.  She is already on oral anti-inflammatories and does not want to start narcotics.  Discussed reasons to return to care and seek emergent care.  Follow-up as needed.  Type 2 diabetes A1c at goal.  Continue Victoza and metformin.  [name], MD 02/27/2018 8:15 AM

## 2018-02-27 NOTE — Patient Instructions (Signed)
It was very nice to see you today!  I think most of your pain is coming from nerve irritation in your neck and back.  Please try using the Voltaren gel for any spot that you are having aches and pains.  I would like for you to see Dr. Berline Choughigby to take a look at your neck, back, hands, and feet.  I will also place a referral for you [facility] once your insurance coverage changes at the beginning of the new year.  Take care, Dr Jimmey RalphParker

## 2018-02-28 ENCOUNTER — Other Ambulatory Visit: Payer: Self-pay | Admitting: Psychiatry

## 2018-02-28 ENCOUNTER — Other Ambulatory Visit: Payer: Self-pay | Admitting: Family Medicine

## 2018-02-28 ENCOUNTER — Telehealth: Payer: Self-pay

## 2018-02-28 NOTE — Telephone Encounter (Signed)
PA for diclofenac gel is in progress.  Waiting for insurance decision.

## 2018-03-01 NOTE — Telephone Encounter (Signed)
Need to review paper chart, not seen in epic yet by provider

## 2018-03-04 ENCOUNTER — Encounter: Payer: Self-pay | Admitting: Sports Medicine

## 2018-03-04 ENCOUNTER — Ambulatory Visit: Payer: BLUE CROSS/BLUE SHIELD | Admitting: Sports Medicine

## 2018-03-04 VITALS — BP 130/90 | HR 104 | Ht 68.0 in | Wt 260.2 lb

## 2018-03-04 DIAGNOSIS — M79671 Pain in right foot: Secondary | ICD-10-CM

## 2018-03-04 DIAGNOSIS — E669 Obesity, unspecified: Secondary | ICD-10-CM

## 2018-03-04 DIAGNOSIS — E1169 Type 2 diabetes mellitus with other specified complication: Secondary | ICD-10-CM

## 2018-03-04 DIAGNOSIS — R52 Pain, unspecified: Secondary | ICD-10-CM | POA: Diagnosis not present

## 2018-03-04 DIAGNOSIS — M79641 Pain in right hand: Secondary | ICD-10-CM | POA: Diagnosis not present

## 2018-03-04 NOTE — Patient Instructions (Addendum)
It was nice to meet you.  I recommend having a workup for possible Psoriatic Arthritis.  There are some possible treatments for this but it is not straight forward and would need to come from a Rheumatologist or Dermatologist.  A nerve conduction study is the other test I would consider having done.  Please let us know if we can help with any of this for you.

## 2018-03-04 NOTE — Progress Notes (Signed)
Veverly FellsMichael D. Delorise Shinerigby, DO  [facility] [phone]  Marzetta BoardMichelle Lyall - 52 y.o. female MRN [id number]  Date of birth: 02/23/1966  Visit Date: 03/04/2018  PCP: Ardith DarkParker, Caleb M, MD   Referred by: Ardith DarkParker, Caleb M, MD  SUBJECTIVE:   Chief Complaint  Patient presents with  . Initial Assessment    Referred by Dr. Jacquiline Doealeb Parker  . R hand pain    MRI c-spine 05/10/2015. Dorsal aspect. Pain comes and goes, burning sensation.   . R foot pain    XR L-spine 12/12/16. Lateral, burning sensation.     HPI: Patient is here for multiple symptoms as above.  She has had polyarthralgia complaints for quite some time that interferes with sleep.  She has chronic headaches numbness tingling weakness in bilateral extremities worse in the right hand and feet.  She is undergone a fairly extensive work-up and unfortunately is getting ready to lose her insurance.  She is here today for second opinion.  She does have history of overlying skin and occasional nailbed changes.  Work-up thus far has been unrevealing for rheumatoid arthritis.  She has been tried on Celebrex Lyrica and nortriptyline as well as topical diclofenac which have been only mildly temporizing of her symptoms.  REVIEW OF SYSTEMS: Per HPI Otherwise 12 point review of systems performed and is negative   HISTORY:  Prior history reviewed and updated per electronic medical record.  Patient Active Problem List   Diagnosis Date Noted  . Acne 01/10/2018  . Primary hypercoagulable state (HCC) 10/10/2017  . Vitamin B12 deficiency 03/18/2017  . Diffuse pain 02/22/2017  . Morbid obesity (HCC) 06/22/2016  . Elevated blood pressure, situational 06/22/2016  . MDD (major depressive disorder), recurrent severe, without psychosis (HCC) 06/14/2016  . Intentional drug overdose (HCC)   . Diabetes mellitus type 2 in obese (HCC) 06/11/2016  . Positive ANA (antinuclear antibody) 04/02/2016    Dr. Alben DeedsJames  Beekman, [facility]   . Hyperparathyroidism (HCC) 01/09/2016  . Snoring 09/20/2015  . Cervical disc disorder with radiculopathy of cervical region 05/12/2015  . Hepatic steatosis 04/18/2015    Seen on CT   . Achilles tendonitis 04/08/2012  . PTE (pulmonary thromboembolism) (HCC) 10/10/2011    1996/on OCPs   . Bipolar affective disorder (HCC) 10/10/2011  . AR (allergic rhinitis) 10/10/2011   Social History   Occupational History  . Occupation: admin assist    Comment: Engineer, manufacturing systemsinsurance verification  Tobacco Use  . Smoking status: Former Smoker    Packs/day: 1.00    Years: 20.00    Pack years: 20.00    Last attempt to quit: 03/12/2004    Years since quitting: 14.1  . Smokeless tobacco: Never Used  Substance and Sexual Activity  . Alcohol use: Yes    Alcohol/week: 1.0 standard drinks    Types: 1 Cans of beer per week    Comment: 1-2 times a week  . Drug use: No  . Sexual activity: Yes    Partners: Male    Birth control/protection: None   Social History   Social History Narrative   Originally from [place]. Always lived in [place]. 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 No international travel recently. Previously has been to [place] in May 1996. No mold exposure recently. No bird exposure. Does have multiple pets.    Past Medical History:  Diagnosis Date  . Anxiety   . Asthma   . Depression   . Diabetes mellitus type 2 in  obese (HCC) 06/11/2016  . Embolism - blood clot January 1997 & January 2017   Also had LLE DVT in 1997  . Hypertension   . Preeclampsia 10/10/2011   1999   . Sleep apnea    Past Surgical History:  Procedure Laterality Date  . KIDNEY STONE SURGERY     family history includes Arthritis in her mother; Clotting disorder in her maternal aunt, maternal grandmother, mother, and sister; Dementia in her father; Heart disease in her father. There is no history of Rheumatologic disease or Hyperparathyroidism. Recent Labs    06/05/17 1040  10/10/17 0934 02/27/18 0847  HGBA1C 7.3* 5.4 6.2*  CREATININE 0.57  --   --   CALCIUM 9.8  --   --   AST 28  --   --   ALT 39*  --   --     OBJECTIVE:  VS:  HT:5\' 8"  (172.7 cm)   WT:260 lb 3.2 oz (118 kg)  BMI:39.57    BP:130/90  HR:(!) 104bpm  TEMP: ( )  RESP:94 %   PHYSICAL EXAM: Adult female. No acute distress.  Alert and appropriate. EYES: Pupils are equal., EOM intact without nystagmus. and No scleral icterus. Psychiatric: Alert & appropriately interactive. and Not depressed or anxious appearing. EXTREMITY EXAM: Warm and well perfused  Bilateral upper and lower extremities are normally aligned with nailbed erosions and stippling of the nails.   ASSESSMENT:   1. Pain of right hand   2. Right foot pain   3. Diabetes mellitus type 2 in obese (HCC)   4. Diffuse pain     PROCEDURES:  None  PLAN:  Pertinent additional documentation may be included in corresponding procedure notes, imaging studies, problem based documentation and patient instructions.  No problem-specific Assessment & Plan notes found for this encounter.   >50% of this 45 minute visit spent in direct patient counseling and/or coordination of care.  Discussion was focused on education regarding the in discussing the pathoetiology and anticipated clinical course of the above condition.  Ultimately this is a difficult case.  She seems to fall with an inflammatory arthropathy type picture and given the nailbed changes further diagnostic evaluation and evaluation by dermatology/rheumatology for considering psoriatic arthritis with only minimal skin changes should be considered.  She unfortunately her insurance is changing and she will not be able to see us.   I am happy to see her back at any time but would consider having her get worked up for psoriatic arthritis.  The other test that could be beneficial for the generalized pain and questionable muscle spasms would be a nerve conduction study and EMG of the  upper extremities.  Activity modifications and the importance of avoiding exacerbating activities (limiting pain to no more than a 4 / 10 during or following activity) recommended and discussed.  Discussed red flag symptoms that warrant earlier emergent evaluation and patient voices understanding.   No orders of the defined types were placed in this encounter.  Lab Orders  No laboratory test(s) ordered today   Imaging Orders  No imaging studies ordered today   Referral Orders  No referral(s) requested today    Return if symptoms worsen or fail to improve.          Andrena MewsMichael D Jordynne Mccown, DO    [facility] Physician

## 2018-03-06 DIAGNOSIS — I10 Essential (primary) hypertension: Secondary | ICD-10-CM | POA: Diagnosis not present

## 2018-03-06 DIAGNOSIS — G4733 Obstructive sleep apnea (adult) (pediatric): Secondary | ICD-10-CM | POA: Diagnosis not present

## 2018-03-19 ENCOUNTER — Other Ambulatory Visit: Payer: Self-pay | Admitting: Psychiatry

## 2018-03-19 ENCOUNTER — Other Ambulatory Visit: Payer: Self-pay | Admitting: Family Medicine

## 2018-03-20 NOTE — Telephone Encounter (Signed)
Need to review paper chart not seen in epic  

## 2018-03-26 ENCOUNTER — Other Ambulatory Visit: Payer: Self-pay | Admitting: Family Medicine

## 2018-03-26 NOTE — Telephone Encounter (Signed)
Please advise 

## 2018-04-15 DIAGNOSIS — R760 Raised antibody titer: Secondary | ICD-10-CM | POA: Diagnosis not present

## 2018-04-15 DIAGNOSIS — F332 Major depressive disorder, recurrent severe without psychotic features: Secondary | ICD-10-CM | POA: Diagnosis not present

## 2018-04-15 DIAGNOSIS — R768 Other specified abnormal immunological findings in serum: Secondary | ICD-10-CM | POA: Diagnosis not present

## 2018-04-15 DIAGNOSIS — M797 Fibromyalgia: Secondary | ICD-10-CM | POA: Diagnosis not present

## 2018-04-17 ENCOUNTER — Other Ambulatory Visit: Payer: Self-pay | Admitting: Family Medicine

## 2018-04-17 ENCOUNTER — Other Ambulatory Visit: Payer: Self-pay | Admitting: Psychiatry

## 2018-04-17 NOTE — Telephone Encounter (Signed)
Needs to schedule office visit 

## 2018-05-07 ENCOUNTER — Telehealth: Payer: Self-pay | Admitting: Psychiatry

## 2018-05-07 ENCOUNTER — Ambulatory Visit: Payer: BLUE CROSS/BLUE SHIELD | Admitting: Psychiatry

## 2018-05-07 ENCOUNTER — Encounter: Payer: Self-pay | Admitting: Psychiatry

## 2018-05-07 DIAGNOSIS — F332 Major depressive disorder, recurrent severe without psychotic features: Secondary | ICD-10-CM | POA: Diagnosis not present

## 2018-05-07 DIAGNOSIS — F331 Major depressive disorder, recurrent, moderate: Secondary | ICD-10-CM

## 2018-05-07 MED ORDER — AMITRIPTYLINE HCL 50 MG PO TABS: 250.0000 mg | ORAL_TABLET | Freq: Every day | ORAL | 1 refills | Status: DC

## 2018-05-07 NOTE — Progress Notes (Signed)
Deborah Soto [id number] 1965/06/28 53 y.o.  Subjective:   Patient ID:  Deborah Soto is a 53 y.o. (DOB 02/25/1966) female.  Chief Complaint:  Chief Complaint  Patient presents with  . Follow-up    Medication Management  . Depression   Last seen November 26, 2017 HPI  Seen with H per usual Deborah Soto presents to the office today for follow-up of TRD and anxiety, and insomnia.  At last visit added amitriptyline and reduced nortriptyline to help sleep and pain.  No pain benefit but is sleeping better and still using CPAP.  Less awakening than before.  No SE.    Anxiety can be triggered over minor things.  Sometimes mild panic out of sleep and other triggers.  A lot of stressors but anxiety seems unrelatated.  Easily tearful and a lot of depression too.  Not a lot of SI but occ thoughts over tired of being stressed and chronic pain.  Energy still low.  Not much enjoyment.  Does the things she has to do.  Don't like being around people.  Less talking to friends at kids practices.    Chronic pain still a problem.  Seeing a new fourth rheumatologist.    Past Psychiatric Medication Trials: Failed ECT and TMS, amitriptyline helped for 2 to 3 years in 1987, Trintellix, Emsam, duloxetine 120, sertraline, lithium SE, Latuda 120, lamotrigine 300, Abilify 15, Seroquel 400, risperidone, Geodon, Vraylar, Rexulti, buspirone, modafinil,, Nuvigil, history of overdose on Xanax, buspirone, gabapentin, Lexapro 20, Fetzima, pramipexole, Haldol for agitation, clonazepam, Vyvanse, trazodone, Wellbutrin, fluoxetine, paroxetine, sertraline, propranolol Nortriptyline 200 with a therapeutic blood level. There is a history of suicide attempts and psychiatric hospitalizations.  Review of Systems:  Review of Systems  Constitutional: Positive for fatigue.  Musculoskeletal: Positive for back pain and myalgias. Negative for gait problem.  Neurological: Negative for tremors.  Psychiatric/Behavioral:  Negative for confusion, decreased concentration, hallucinations and self-injury.    Medications: I have reviewed the patient's current medications.  Current Outpatient Medications  Medication Sig Dispense Refill  . Adapalene 0.3 % gel Apply topically at bed time. 45 g 0  . atorvastatin (LIPITOR) 10 MG tablet TAKE 1 TABLET ONCE DAILY. 30 tablet 0  . Blood Glucose Monitoring Suppl (ONETOUCH VERIO) w/Device KIT     . celecoxib (CELEBREX) 200 MG capsule Take 1 capsule (200 mg total) by mouth 2 (two) times daily. 60 capsule 2  . clindamycin (CLINDAGEL) 1 % gel APPLY TOPICALLY TWICE A DAY. 30 g 0  . hydrOXYzine (ATARAX/VISTARIL) 25 MG tablet TAKE 1 TABLET EVERY 6 HOURS AS NEEDED FOR ANXIETY 60 tablet 0  . lithium carbonate (LITHOBID) 300 MG CR tablet TAKE 3 TABLETS AT BEDTIME. 90 tablet 0  . metFORMIN (GLUCOPHAGE-XR) 500 MG 24 hr tablet TAKE (2) TABLETS TWICE DAILY. 120 tablet 0  . nortriptyline (PAMELOR) 50 MG capsule TAKE THREE (3) CAPSULES AT BEDTIME. 90 capsule 0  . ONETOUCH DELICA LANCETS 16R MISC Ck Blood sugar up to 4 times a day as directed. ICD-10 E11.69, E66.9 400 each 0  . ONETOUCH VERIO test strip CHECK BLOOD SUGAR UP TO 4 TIMES A DAY AS DIRECTED. 100 each 3  . pantoprazole (PROTONIX) 40 MG tablet Take 1 tablet (40 mg total) by mouth daily. 30 tablet 3  . pregabalin (LYRICA) 75 MG capsule TAKE 3 CAPSULES TWICE A DAY. (Patient taking differently: 51 mg 2 (two) times daily. ) 180 capsule 0  . TRULICITY 1.5 CV/8.9FY SOPN INJECT 0.5ML ( 1.'5MG'$ ) INTO THE SKIN ONCE WEEKLY.  2 mL 0  . Vitamin D, Ergocalciferol, (DRISDOL) 1.25 MG (50000 UT) CAPS capsule TAKE 1 CAPSULE 3 TIMES WEEKLY. 12 capsule 0  . XARELTO 10 MG TABS tablet TAKE 1 TABLET ONCE DAILY WITH FOOD. 30 tablet 0  . amitriptyline (ELAVIL) 50 MG tablet Take 5 tablets (250 mg total) by mouth at bedtime. 150 tablet 1  . diclofenac sodium (VOLTAREN) 1 % GEL Apply 4 g topically 4 (four) times daily as needed. (Patient not taking: Reported on  05/07/2018) 100 g 3  . Pyridoxine HCl (VITAMIN B-6) 500 MG tablet Take 1,000 mg by mouth daily.    . vitamin B-12 (CYANOCOBALAMIN) 1000 MCG tablet Take 1,000 mcg by mouth daily.     No current facility-administered medications for this visit.     Medication Side Effects: Sedation  Allergies:  Allergies  Allergen Reactions  . Tramadol Other (See Comments)    Tingling all over     Past Medical History:  Diagnosis Date  . Anxiety   . Asthma   . Depression   . Diabetes mellitus type 2 in obese ([place]) 06/11/2016  . Embolism - blood clot January 1997 & January 2017   Also had LLE DVT in 1997  . Hypertension   . Preeclampsia 10/10/2011   1999   . Sleep apnea     Family History  Problem Relation Age of Onset  . Dementia Father   . Heart disease Father   . Clotting disorder Mother   . Arthritis Mother   . Clotting disorder Sister   . Clotting disorder Maternal Grandmother   . Clotting disorder Maternal Aunt   . Rheumatologic disease Neg Hx   . Hyperparathyroidism Neg Hx     Social History   Socioeconomic History  . Marital status: Married    Spouse name: Not on file  . Number of children: Y  . Years of education: Not on file  . Highest education level: Not on file  Occupational History  . Occupation: admin assist    Comment: Pharmacist, community  Social Needs  . Financial resource strain: Not on file  . Food insecurity:    Worry: Not on file    Inability: Not on file  . Transportation needs:    Medical: Not on file    Non-medical: Not on file  Tobacco Use  . Smoking status: Former Smoker    Packs/day: 1.00    Years: 20.00    Pack years: 20.00    Last attempt to quit: 03/12/2004    Years since quitting: 14.1  . Smokeless tobacco: Never Used  Substance and Sexual Activity  . Alcohol use: Yes    Alcohol/week: 1.0 standard drinks    Types: 1 Cans of beer per week    Comment: 1-2 times a week  . Drug use: No  . Sexual activity: Yes    Partners: Male     Birth control/protection: None  Lifestyle  . Physical activity:    Days per week: Not on file    Minutes per session: Not on file  . Stress: Not on file  Relationships  . Social connections:    Talks on phone: Not on file    Gets together: Not on file    Attends religious service: Not on file    Active member of club or organization: Not on file    Attends meetings of clubs or organizations: Not on file    Relationship status: Not on file  . Intimate partner violence:  Fear of current or ex partner: Not on file    Emotionally abused: Not on file    Physically abused: Not on file    Forced sexual activity: Not on file  Other Topics Concern  . Not on file  Social History Narrative   Originally from [place]. Always lived in [place]. 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 No international travel recently. Previously has been to [place] in May 1996. No mold exposure recently. No bird exposure. Does have multiple pets.     Past Medical History, Surgical history, Social history, and Family history were reviewed and updated as appropriate.   Please see review of systems for further details on the patient's review from today.   Objective:   Physical Exam:  LMP  (LMP Unknown) Comment: last period ---  1 year ago (?)  Physical Exam Constitutional:      Appearance: She is obese.  Neurological:     Mental Status: She is alert.     Motor: No tremor.     Gait: Gait normal.  Psychiatric:        Attention and Perception: She is attentive. She does not perceive auditory or visual hallucinations.        Mood and Affect: Mood is anxious and depressed.        Speech: Speech is not rapid and pressured, delayed, slurred or tangential.        Behavior: Behavior is not slowed, aggressive or hyperactive. Behavior is cooperative.        Thought Content: Thought content is not paranoid. Thought content includes suicidal ideation. Thought content does not include homicidal ideation. Thought content  does not include suicidal plan.        Cognition and Memory: Cognition and memory normal.     Comments: Insight and judgment are fair. Her affect is chronically variable from the use of humor to irritable to blunted at times.  This is true over many different visits. She commits to safety     Lab Review:     Component Value Date/Time   NA 142 06/05/2017 1040   NA 140 12/11/2016 1236   K 4.6 06/05/2017 1040   K 4.2 12/11/2016 1236   CL 106 06/05/2017 1040   CO2 25 06/05/2017 1040   CO2 24 12/11/2016 1236   GLUCOSE 114 06/05/2017 1040   GLUCOSE 109 12/11/2016 1236   BUN 11 06/05/2017 1040   BUN 15.1 12/11/2016 1236   CREATININE 0.57 06/05/2017 1040   CREATININE 0.8 12/11/2016 1236   CALCIUM 9.8 06/05/2017 1040   CALCIUM 10.0 12/11/2016 1236   PROT 6.7 06/05/2017 1040   PROT 7.5 12/11/2016 1236   ALBUMIN 4.4 03/21/2017 1210   ALBUMIN 4.4 12/11/2016 1236   AST 28 06/05/2017 1040   AST 38 (H) 12/11/2016 1236   ALT 39 (H) 06/05/2017 1040   ALT 61 (H) 12/11/2016 1236   ALKPHOS 70 03/21/2017 1210   ALKPHOS 71 12/11/2016 1236   BILITOT 0.4 06/05/2017 1040   BILITOT 0.62 12/11/2016 1236   GFRNONAA >60 03/21/2017 1210   GFRNONAA >89 12/07/2015 1208   GFRAA >60 03/21/2017 1210   GFRAA >89 12/07/2015 1208       Component Value Date/Time   WBC 9.7 10/10/2017 1014   RBC 4.65 10/10/2017 1014   HGB 14.2 10/10/2017 1014   HGB 14.5 12/11/2016 1236   HCT 42.5 10/10/2017 1014   HCT 43.9 12/11/2016 1236   PLT 304.0 10/10/2017 1014   PLT 289 12/11/2016 1236  PLT 295 07/24/2016 1848   MCV 91.4 10/10/2017 1014   MCV 93.2 12/11/2016 1236   MCH 29.7 06/05/2017 1040   MCHC 33.3 10/10/2017 1014   RDW 13.9 10/10/2017 1014   RDW 13.5 12/11/2016 1236   LYMPHSABS 2,330 06/05/2017 1040   LYMPHSABS 2.4 12/11/2016 1236   MONOABS 0.7 12/11/2016 1236   EOSABS 692 (H) 06/05/2017 1040   EOSABS 0.7 (H) 12/11/2016 1236   BASOSABS 73 06/05/2017 1040   BASOSABS 0.0 12/11/2016 1236     Lithium Lvl  Date Value Ref Range Status  10/10/2017 0.7 0.6 - 1.2 mmol/L Final    Serum nortriptyline level on 150 mg a day was 109  No results found for: PHENYTOIN, PHENOBARB, VALPROATE, CBMZ   .res Assessment: Plan:    Major depressive disorder, recurrent episode, moderate (HCC)  Major depressive disorder, recurrent, severe without psychotic features ([place])   Deborah Soto has chronic severe major depression and generalized anxiety that is treatment resistant and failed multiple medications ECT and TMS as noted above including SSRIs, try cyclic's, lithium, and atypical antipsychotics for depression..  Prognosis is guarded.  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  There has been a discussion about possible bipolar elements but she has no clear manic episodes unrelated to medication changes.  She has features of atypical depression.  Therefore consideration could be given to other trials of MAO inhibitors as they are classically more effective than other antidepressants and atypical depression.  She did fail a trial of Emsam.  Consideration could be given to using tablet version of selegiline and going to much higher doses or the use of Parnate or Nardil..  Is sleeping better with amitriptyline added.  Consider switching entirely to it in hopes of better controlling the depression.    Encourage physical activity and weight loss.  She is not particularly motivated and feels that is hard to accomplish because of chronic pain.  Chronic pain complicates her treatment of depression.  We will continue the lithium as it does help to reduce suicidal ideation and behavior.  Because of failure of other types of antidepressants and amitriptyline has a broader biochemical effect in the brain then does nortriptyline we will gradually switch her from nortriptyline to amitriptyline in hopes of a better response.  Please see After Visit Summary for patient specific  instructions. Increase amitriptyline to 100 mg at night and continue nortriptyline 150 mg for 5 days, Then increase amitriptyline 150 mg and reduce nortriptyline 100 mg for 5 days, Then increase amitriptyline to 200 mg and reduce nortriptyline to 50 mg for 5 days,  then increase amitriptyline to 250 mg and stop nortriptyline  Wait at least 7 days then get the blood test preferably in the morning to test the level of amitriptyline  This was a 40-minute appointment  Follow-up 6 weeks  [name], [name]  Future Appointments  Date Time Provider [place]  07/09/2018  9:00 AM Cottle, Billey Co., MD [facility] None    No orders of the defined types were placed in this encounter.     -------------------------------

## 2018-05-07 NOTE — Telephone Encounter (Signed)
Patient has an appt at [facility] tomorrow. Forgot to ask during her appointment today was the genetic testing sent over?

## 2018-05-07 NOTE — Patient Instructions (Signed)
Increase amitriptyline to 100 mg at night and continue nortriptyline 150 mg for 5 days, Then increase amitriptyline 150 mg and reduce nortriptyline 100 mg for 5 days, Then increase amitriptyline to 200 mg and reduce nortriptyline to 50 mg for 5 days,  then increase amitriptyline to 250 mg and stop nortriptyline  Wait at least 7 days then get the blood test preferably in the morning to test the level of amitriptyline

## 2018-05-08 DIAGNOSIS — M797 Fibromyalgia: Secondary | ICD-10-CM | POA: Diagnosis not present

## 2018-05-08 DIAGNOSIS — R760 Raised antibody titer: Secondary | ICD-10-CM | POA: Diagnosis not present

## 2018-05-21 ENCOUNTER — Other Ambulatory Visit: Payer: Self-pay | Admitting: Psychiatry

## 2018-05-21 ENCOUNTER — Other Ambulatory Visit: Payer: Self-pay | Admitting: Family Medicine

## 2018-05-21 DIAGNOSIS — M797 Fibromyalgia: Secondary | ICD-10-CM | POA: Diagnosis not present

## 2018-05-21 DIAGNOSIS — R2681 Unsteadiness on feet: Secondary | ICD-10-CM | POA: Diagnosis not present

## 2018-05-21 DIAGNOSIS — R5383 Other fatigue: Secondary | ICD-10-CM | POA: Diagnosis not present

## 2018-05-21 DIAGNOSIS — R109 Unspecified abdominal pain: Secondary | ICD-10-CM | POA: Diagnosis not present

## 2018-05-21 DIAGNOSIS — E1140 Type 2 diabetes mellitus with diabetic neuropathy, unspecified: Secondary | ICD-10-CM | POA: Diagnosis not present

## 2018-05-21 DIAGNOSIS — Z79899 Other long term (current) drug therapy: Secondary | ICD-10-CM | POA: Diagnosis not present

## 2018-05-21 DIAGNOSIS — E538 Deficiency of other specified B group vitamins: Secondary | ICD-10-CM | POA: Diagnosis not present

## 2018-05-21 DIAGNOSIS — R202 Paresthesia of skin: Secondary | ICD-10-CM | POA: Diagnosis not present

## 2018-06-21 ENCOUNTER — Other Ambulatory Visit: Payer: Self-pay | Admitting: Psychiatry

## 2018-06-21 ENCOUNTER — Other Ambulatory Visit: Payer: Self-pay | Admitting: Family Medicine

## 2018-07-09 ENCOUNTER — Ambulatory Visit: Payer: BLUE CROSS/BLUE SHIELD | Admitting: Psychiatry

## 2018-07-19 ENCOUNTER — Other Ambulatory Visit: Payer: Self-pay | Admitting: Family Medicine

## 2018-07-22 ENCOUNTER — Other Ambulatory Visit: Payer: Self-pay | Admitting: Psychiatry

## 2018-07-23 NOTE — Telephone Encounter (Signed)
This rx is from 2018, ok to fill? Not on med list

## 2018-07-30 IMAGING — CT CT ANGIO CHEST
2 of 6 series · 18 of 36 positions shown · IV contrast (Omni 300)
Comparison: None.

CLINICAL DATA: Anterior chest pain radiating to back with shortness
of breath, nausea and tightness of chest.

EXAM:
CT ANGIOGRAPHY CHEST WITH CONTRAST
TECHNIQUE: Multidetector CT imaging of the chest was performed using the
standard protocol during bolus administration of intravenous
contrast. Multiplanar CT image reconstructions and MIPs were
obtained to evaluate the vascular anatomy.
CONTRAST:  100 mL Omnipaque 300

[Series 7: pe thins · axial · 0.69mm/px · z∈[-299,-98]mm · 17 of 227 slices shown]
[im 13/227  lung]
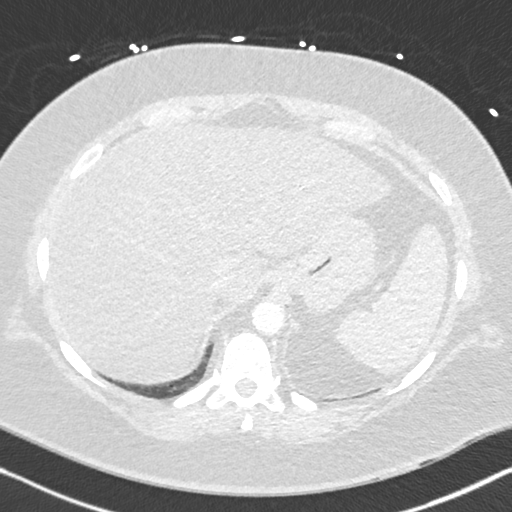
[im 26/227  mediastinal]
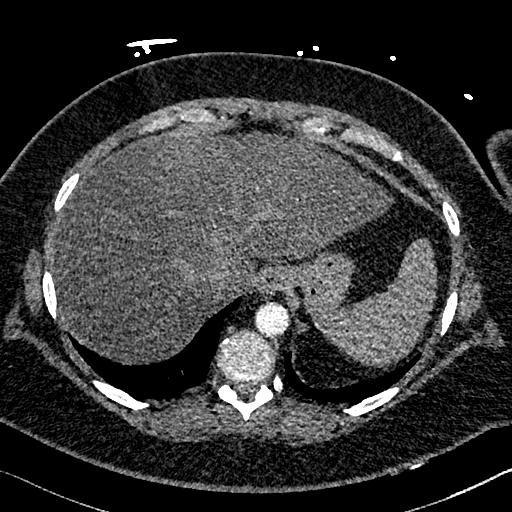
[im 38/227  lung]
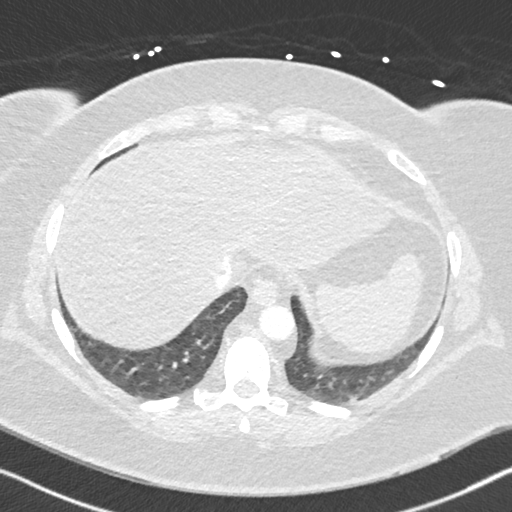
[im 51/227  mediastinal]
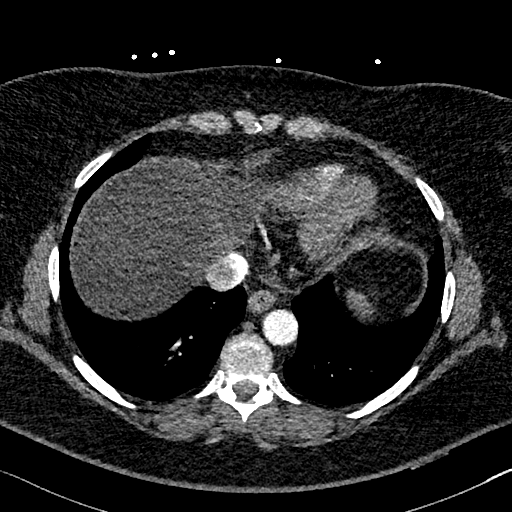
[im 63/227  lung]
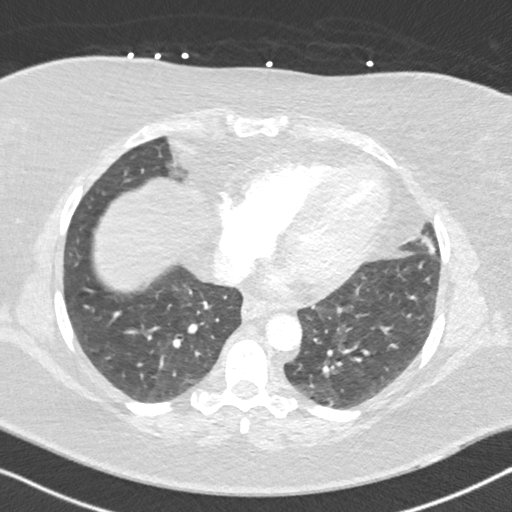
[im 76/227  mediastinal]
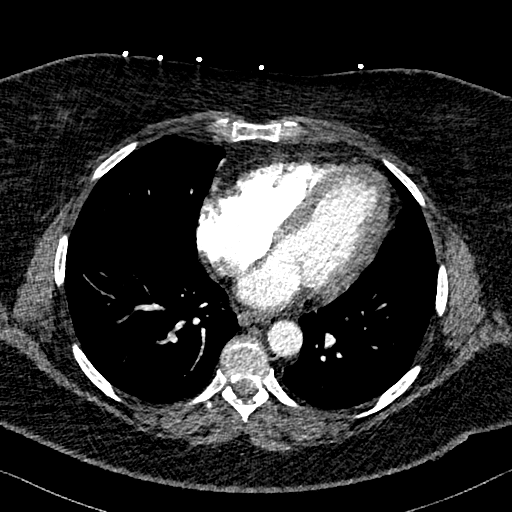
[im 88/227  lung]
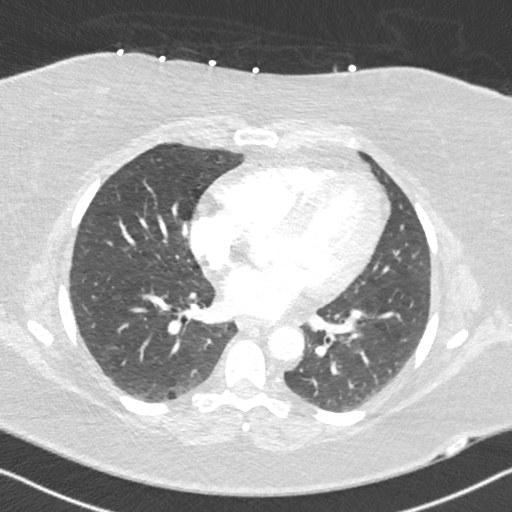
[im 101/227  mediastinal]
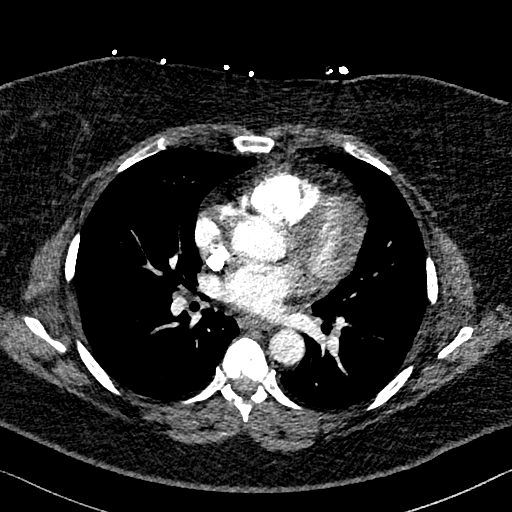
[im 114/227  lung]
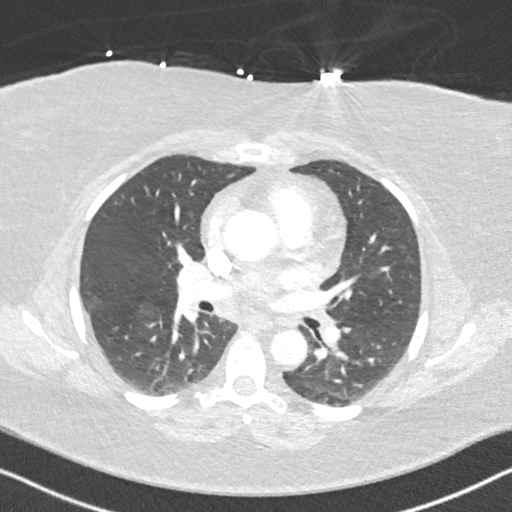
[im 126/227  mediastinal]
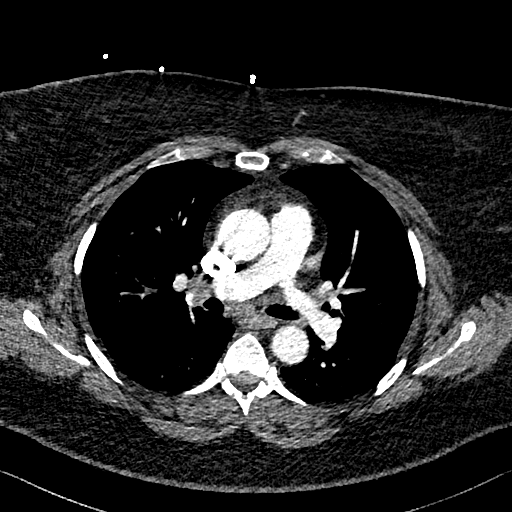
[im 139/227  lung]
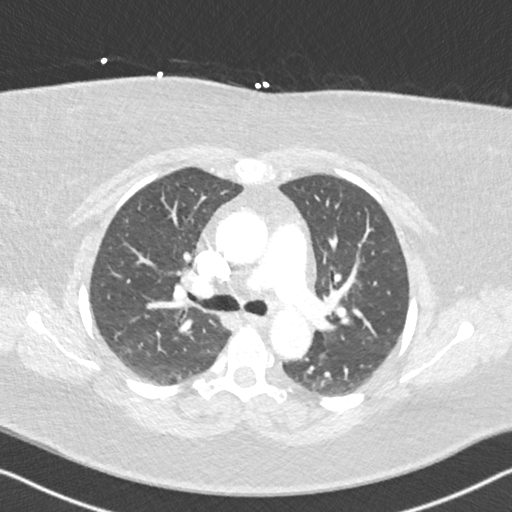
[im 151/227  mediastinal]
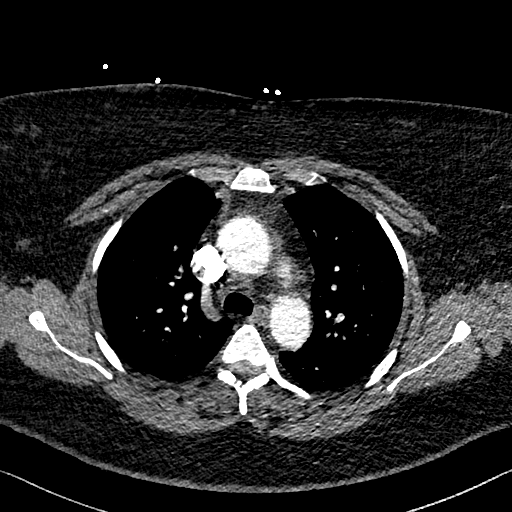
[im 164/227  lung]
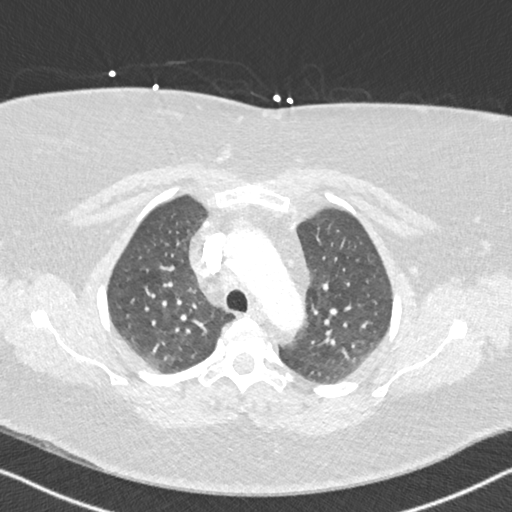
[im 176/227  mediastinal]
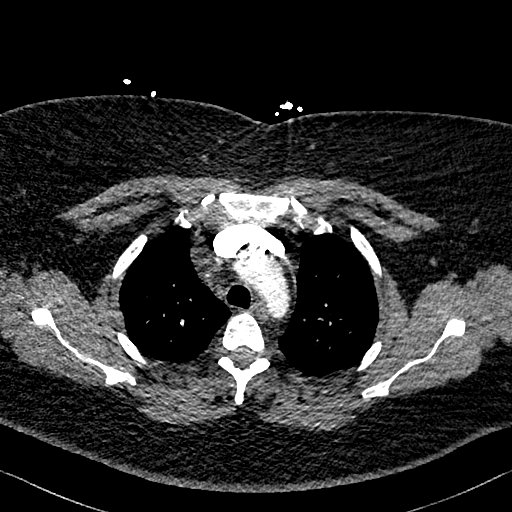
[im 189/227  lung]
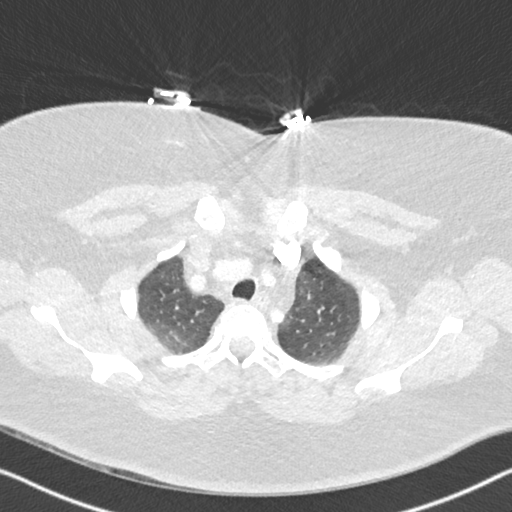
[im 201/227  mediastinal]
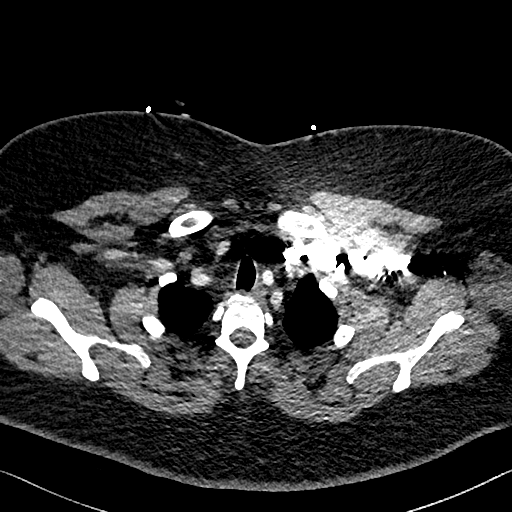
[im 214/227  lung]
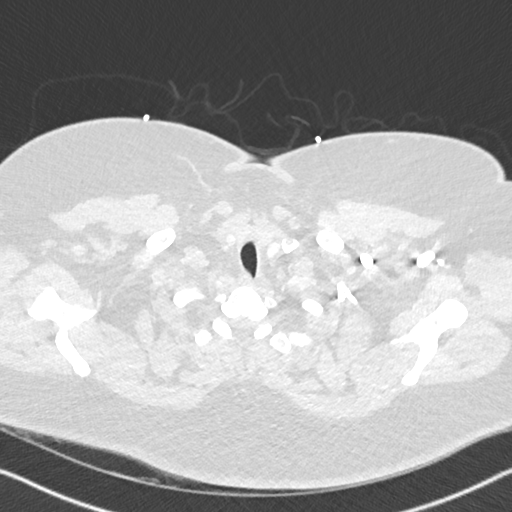

[Series 8: pe 2mm cor · coronal · 0.52mm/px · 1 of 151 slices shown]
[im 76/151  mediastinal]
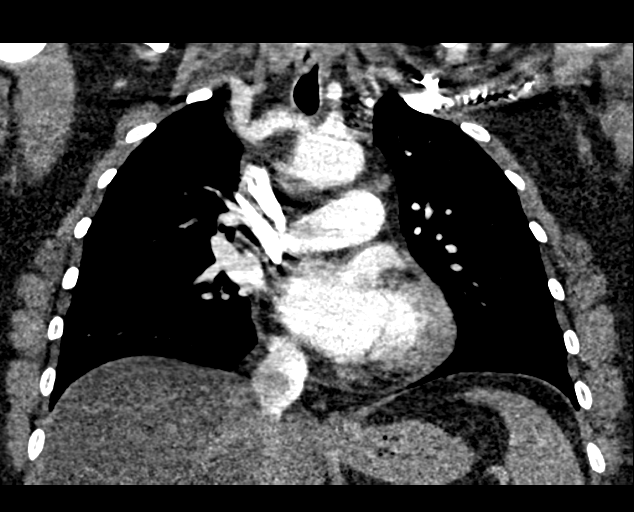

[18 of 36 positions shown; findings below may reference images not displayed]

FINDINGS: Cardiovascular: Satisfactory opacification of the pulmonary arteries
to the segmental level. No evidence of pulmonary embolism. Normal
heart size. No pericardial effusion. Normal caliber thoracic aorta.
No thoracic aortic dissection.

Mediastinum/Nodes: No enlarged mediastinal, hilar, or axillary lymph
nodes. Thyroid gland, trachea, and esophagus demonstrate no
significant findings.

Lungs/Pleura: Lungs are clear. No pleural effusion or pneumothorax.

Upper Abdomen: No acute abnormality.

Musculoskeletal: No chest wall abnormality. No acute or significant
osseous findings.

Review of the MIP images confirms the above findings.
IMPRESSION: 1. No evidence pulmonary embolus
2. No thoracic aortic aneurysm or dissection.

## 2018-08-14 ENCOUNTER — Other Ambulatory Visit: Payer: Self-pay | Admitting: Family Medicine

## 2018-08-15 NOTE — Telephone Encounter (Signed)
Left voice message for patient to call clinic.Patient needs an appt  

## 2018-08-15 NOTE — Telephone Encounter (Signed)
See note  Copied from CRM [id number]. Topic: General - Other >> Aug 15, 2018  2:37 PM Fanny Bien wrote: Reason for CRM: pt called and stated that she received a call from the office. Pt would like a call back regarding. Will call back with name who called.

## 2018-08-18 ENCOUNTER — Other Ambulatory Visit: Payer: Self-pay | Admitting: Psychiatry

## 2018-08-18 ENCOUNTER — Other Ambulatory Visit: Payer: Self-pay | Admitting: Family Medicine

## 2018-08-18 NOTE — Telephone Encounter (Signed)
Pt has had cancellations and no shows, not seen since 04/2018

## 2018-08-18 NOTE — Telephone Encounter (Signed)
Patient may have a different provider left voice message to call clinic

## 2018-08-19 NOTE — Telephone Encounter (Signed)
Call to reschedule her here unless she is seeing a physician elsewhere.

## 2018-08-19 NOTE — Telephone Encounter (Signed)
This patient has an insurance that is out of network so she can't come and see dr. Clovis Pu anymore. She is suppose to be looking for a new doctor.

## 2018-08-22 ENCOUNTER — Other Ambulatory Visit: Payer: Self-pay | Admitting: Family Medicine

## 2018-08-25 NOTE — Telephone Encounter (Signed)
Last OV 02/27/18 Last refill 07/21/18 #2 mL/0 Next OV not scheduled  Scheduled follow-up visit.

## 2018-08-26 NOTE — Telephone Encounter (Signed)
Looks like pt has transferred care. Called and left VM for pt to call the office. Would like to confirm TOC.

## 2018-09-17 ENCOUNTER — Other Ambulatory Visit: Payer: Self-pay | Admitting: Family Medicine

## 2018-09-17 ENCOUNTER — Other Ambulatory Visit: Payer: Self-pay | Admitting: Psychiatry

## 2018-09-20 ENCOUNTER — Encounter: Payer: Self-pay | Admitting: Family Medicine

## 2018-09-24 ENCOUNTER — Other Ambulatory Visit: Payer: Self-pay | Admitting: Psychiatry

## 2018-10-08 DIAGNOSIS — F419 Anxiety disorder, unspecified: Secondary | ICD-10-CM | POA: Diagnosis not present

## 2018-10-08 DIAGNOSIS — F332 Major depressive disorder, recurrent severe without psychotic features: Secondary | ICD-10-CM | POA: Diagnosis not present

## 2018-10-08 DIAGNOSIS — Z79899 Other long term (current) drug therapy: Secondary | ICD-10-CM | POA: Insufficient documentation

## 2018-10-08 DIAGNOSIS — G4700 Insomnia, unspecified: Secondary | ICD-10-CM | POA: Insufficient documentation

## 2018-10-08 DIAGNOSIS — R251 Tremor, unspecified: Secondary | ICD-10-CM | POA: Diagnosis not present

## 2018-10-13 ENCOUNTER — Other Ambulatory Visit: Payer: Self-pay | Admitting: Psychiatry

## 2018-11-14 ENCOUNTER — Other Ambulatory Visit: Payer: Self-pay | Admitting: Psychiatry

## 2018-12-08 ENCOUNTER — Ambulatory Visit (INDEPENDENT_AMBULATORY_CARE_PROVIDER_SITE_OTHER): Payer: BLUE CROSS/BLUE SHIELD | Admitting: Psychiatry

## 2018-12-08 ENCOUNTER — Encounter: Payer: Self-pay | Admitting: Psychiatry

## 2018-12-08 ENCOUNTER — Other Ambulatory Visit: Payer: Self-pay

## 2018-12-08 DIAGNOSIS — F331 Major depressive disorder, recurrent, moderate: Secondary | ICD-10-CM

## 2018-12-08 DIAGNOSIS — Z79899 Other long term (current) drug therapy: Secondary | ICD-10-CM

## 2018-12-08 DIAGNOSIS — F411 Generalized anxiety disorder: Secondary | ICD-10-CM

## 2018-12-08 DIAGNOSIS — G251 Drug-induced tremor: Secondary | ICD-10-CM

## 2018-12-08 DIAGNOSIS — Z5181 Encounter for therapeutic drug level monitoring: Secondary | ICD-10-CM

## 2018-12-08 DIAGNOSIS — R52 Pain, unspecified: Secondary | ICD-10-CM

## 2018-12-08 NOTE — Progress Notes (Signed)
Deborah Soto [id number] 1966-01-23 53 y.o.   Virtual Visit via Telephone Note  I connected with pt by telephone and verified that I am speaking with the correct person using two identifiers.   I discussed the limitations, risks, security and privacy concerns of performing an evaluation and management service by telephone and the availability of in person appointments. I also discussed with the patient that there may be a patient responsible charge related to this service. The patient expressed understanding and agreed to proceed.  I discussed the assessment and treatment plan with the patient. The patient was provided an opportunity to ask questions and all were answered. The patient agreed with the plan and demonstrated an understanding of the instructions.   The patient was advised to call back or seek an in-person evaluation if the symptoms worsen or if the condition fails to improve as anticipated.  I provided 25 minutes of non-face-to-face time during this encounter. The call started at 1120 and ended at 1145. The patient was located at car and the provider was located office.   Subjective:   Patient ID:  Deborah Soto is a 53 y.o. (DOB June 16, 1965) female.  Chief Complaint:  Chief Complaint  Patient presents with  . Medication Problem    tremor and dry mouth  . Depression   HPI   Deborah Soto presents to the office today for follow-up of TRD and anxiety, and insomnia.  Last seen Feb.  Went to doctor at [facility] and didn't like her.  Went there DT insurance.  CO shakiness from lithium which interferes with typing. Also a good bit of anxiety generally without panic.  BS are high.  Taking hydroxyzine without help for anxity.    Sleeping better with amitriptyline.  No pain benefit but is sleeping better and still using CPAP.  Less awakening than before.  No SE.    Anxiety can be triggered over minor things.  Lately anxiety worse than depression.  No SI lately.   A lot of  stressors but anxiety seems unrelatated.  Not Easily tearful.  Not a lot of SI but occ thoughts over tired of being stressed and chronic pain.  Pain never helped with meds.  Energy still low.  Not much enjoyment.  Does the things she has to do.  Don't like being around people.    Chronic pain still a problem.  Seeing a new fourth rheumatologist.    Past Psychiatric Medication Trials: Failed ECT and TMS, amitriptyline helped for 2 to 3 years in 1987, Trintellix, Emsam, duloxetine 120, sertraline, lithium SE, Latuda 120, lamotrigine 300, Abilify 15, Seroquel 400, risperidone, Geodon, Vraylar, Rexulti, buspirone, modafinil,, Nuvigil, history of overdose on Xanax, buspirone, gabapentin, Lexapro 20, Fetzima, pramipexole, Haldol for agitation, clonazepam, Vyvanse, trazodone, Wellbutrin, fluoxetine, paroxetine, sertraline, propranolol Nortriptyline 200 with a therapeutic blood level. There is a history of suicide attempts and psychiatric hospitalizations.  Review of Systems:  Review of Systems  Constitutional: Positive for fatigue.  Musculoskeletal: Positive for back pain and myalgias. Negative for gait problem.  Neurological: Positive for tremors.  Psychiatric/Behavioral: Negative for confusion, decreased concentration, hallucinations and self-injury.    Medications: I have reviewed the patient's current medications.  Current Outpatient Medications  Medication Sig Dispense Refill  . Adapalene 0.3 % gel Apply topically at bed time. 45 g 0  . amitriptyline (ELAVIL) 50 MG tablet TAKE 5 TABLETS AT BEDTIME 150 tablet 0  . atorvastatin (LIPITOR) 10 MG tablet TAKE 1 TABLET ONCE DAILY. 30 tablet 0  . Blood  Glucose Monitoring Suppl (ONETOUCH VERIO) w/Device KIT     . celecoxib (CELEBREX) 200 MG capsule TAKE (1) CAPSULE TWICE DAILY. 60 capsule 0  . clindamycin (CLINDAGEL) 1 % gel APPLY TOPICALLY TWICE A DAY. 30 g 0  . diclofenac sodium (VOLTAREN) 1 % GEL Apply 4 g topically 4 (four) times daily as needed.  100 g 3  . hydrOXYzine (ATARAX/VISTARIL) 25 MG tablet TAKE 1 TABLET EVERY 6 HOURS AS NEEDED FOR ANXIETY 60 tablet 2  . lithium carbonate (LITHOBID) 300 MG CR tablet TAKE 3 TABLETS AT BEDTIME. 90 tablet 0  . metFORMIN (GLUCOPHAGE-XR) 500 MG 24 hr tablet TAKE (2) TABLETS TWICE DAILY. 120 tablet 0  . ONETOUCH DELICA LANCETS 51O MISC Ck Blood sugar up to 4 times a day as directed. ICD-10 E11.69, E66.9 400 each 0  . ONETOUCH VERIO test strip CHECK BLOOD SUGAR UP TO 4 TIMES A DAY AS DIRECTED. 100 each 3  . pantoprazole (PROTONIX) 40 MG tablet TAKE 1 TABLET ONCE DAILY. 30 tablet 0  . pregabalin (LYRICA) 75 MG capsule TAKE 3 CAPSULES TWICE A DAY. (Patient taking differently: 225 mg 2 (two) times daily. ) 180 capsule 0  . propranolol (INDERAL) 20 MG tablet TAKE 1 OR 2 TABLETS TWICE A DAY AS NEEDED FOR ANXIETY. 60 tablet 0  . Pyridoxine HCl (VITAMIN B-6) 500 MG tablet Take 1,000 mg by mouth daily.    . TRULICITY 1.5 AC/1.6SA SOPN INJECT 0.5ML ( 1.'5MG'$ ) INTO THE SKIN ONCE WEEKLY. 2 mL 0  . vitamin B-12 (CYANOCOBALAMIN) 1000 MCG tablet Take 1,000 mcg by mouth daily.    . Vitamin D, Ergocalciferol, (DRISDOL) 1.25 MG (50000 UT) CAPS capsule TAKE 1 CAPSULE 3 TIMES WEEKLY. 12 capsule 0  . XARELTO 10 MG TABS tablet TAKE 1 TABLET ONCE DAILY WITH FOOD. 30 tablet 0  . nortriptyline (PAMELOR) 50 MG capsule TAKE THREE (3) CAPSULES AT BEDTIME. (Patient not taking: Reported on 12/08/2018) 90 capsule 0   No current facility-administered medications for this visit.     Medication Side Effects: Sedation  Allergies:  Allergies  Allergen Reactions  . Tramadol Other (See Comments)    Tingling all over     Past Medical History:  Diagnosis Date  . Anxiety   . Asthma   . Depression   . Diabetes mellitus type 2 in obese ([place]) 06/11/2016  . Embolism - blood clot January 1997 & January 2017   Also had LLE DVT in 1997  . Hypertension   . Preeclampsia 10/10/2011   1999   . Sleep apnea     Family History  Problem  Relation Age of Onset  . Dementia Father   . Heart disease Father   . Clotting disorder Mother   . Arthritis Mother   . Clotting disorder Sister   . Clotting disorder Maternal Grandmother   . Clotting disorder Maternal Aunt   . Rheumatologic disease Neg Hx   . Hyperparathyroidism Neg Hx     Social History   Socioeconomic History  . Marital status: Married    Spouse name: Not on file  . Number of children: Y  . Years of education: Not on file  . Highest education level: Not on file  Occupational History  . Occupation: admin assist    Comment: Pharmacist, community  Social Needs  . Financial resource strain: Not on file  . Food insecurity    Worry: Not on file    Inability: Not on file  . Transportation needs    Medical:  Not on file    Non-medical: Not on file  Tobacco Use  . Smoking status: Former Smoker    Packs/day: 1.00    Years: 20.00    Pack years: 20.00    Quit date: 03/12/2004    Years since quitting: 14.7  . Smokeless tobacco: Never Used  Substance and Sexual Activity  . Alcohol use: Yes    Alcohol/week: 1.0 standard drinks    Types: 1 Cans of beer per week    Comment: 1-2 times a week  . Drug use: No  . Sexual activity: Yes    Partners: Male    Birth control/protection: None  Lifestyle  . Physical activity    Days per week: Not on file    Minutes per session: Not on file  . Stress: Not on file  Relationships  . Social Herbalist on phone: Not on file    Gets together: Not on file    Attends religious service: Not on file    Active member of club or organization: Not on file    Attends meetings of clubs or organizations: Not on file    Relationship status: Not on file  . Intimate partner violence    Fear of current or ex partner: Not on file    Emotionally abused: Not on file    Physically abused: Not on file    Forced sexual activity: Not on file  Other Topics Concern  . Not on file  Social History Narrative   Originally from [place].  Always lived in [place]. 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 No international travel recently. Previously has been to [place] in May 1996. No mold exposure recently. No bird exposure. Does have multiple pets.     Past Medical History, Surgical history, Social history, and Family history were reviewed and updated as appropriate.   Please see review of systems for further details on the patient's review from today.   Objective:   Physical Exam:  LMP  (LMP Unknown) Comment: last period ---  1 year ago (?)  Physical Exam Neurological:     Mental Status: She is alert and oriented to person, place, and time.     Cranial Nerves: No dysarthria.  Psychiatric:        Attention and Perception: Attention normal.        Mood and Affect: Mood is anxious and depressed.        Speech: Speech normal.        Behavior: Behavior is cooperative.        Thought Content: Thought content normal. Thought content is not paranoid or delusional. Thought content does not include homicidal or suicidal ideation. Thought content does not include homicidal or suicidal plan.        Cognition and Memory: Cognition and memory normal.        Judgment: Judgment normal.     Comments: Insight fair. Lately anxiety is worse than depression.  She has not provement in depression on amitriptyline and lithium.  Suicidal thoughts resolved on lithium.  She denies being agitated.     Lab Review:     Component Value Date/Time   NA 142 06/05/2017 1040   NA 140 12/11/2016 1236   K 4.6 06/05/2017 1040   K 4.2 12/11/2016 1236   CL 106 06/05/2017 1040   CO2 25 06/05/2017 1040   CO2 24 12/11/2016 1236   GLUCOSE 114 06/05/2017 1040   GLUCOSE 109 12/11/2016 1236   BUN  11 06/05/2017 1040   BUN 15.1 12/11/2016 1236   CREATININE 0.57 06/05/2017 1040   CREATININE 0.8 12/11/2016 1236   CALCIUM 9.8 06/05/2017 1040   CALCIUM 10.0 12/11/2016 1236   PROT 6.7 06/05/2017 1040   PROT 7.5 12/11/2016 1236   ALBUMIN 4.4 03/21/2017  1210   ALBUMIN 4.4 12/11/2016 1236   AST 28 06/05/2017 1040   AST 38 (H) 12/11/2016 1236   ALT 39 (H) 06/05/2017 1040   ALT 61 (H) 12/11/2016 1236   ALKPHOS 70 03/21/2017 1210   ALKPHOS 71 12/11/2016 1236   BILITOT 0.4 06/05/2017 1040   BILITOT 0.62 12/11/2016 1236   GFRNONAA >60 03/21/2017 1210   GFRNONAA >89 12/07/2015 1208   GFRAA >60 03/21/2017 1210   GFRAA >89 12/07/2015 1208       Component Value Date/Time   WBC 9.7 10/10/2017 1014   RBC 4.65 10/10/2017 1014   HGB 14.2 10/10/2017 1014   HGB 14.5 12/11/2016 1236   HCT 42.5 10/10/2017 1014   HCT 43.9 12/11/2016 1236   PLT 304.0 10/10/2017 1014   PLT 289 12/11/2016 1236   PLT 295 07/24/2016 1848   MCV 91.4 10/10/2017 1014   MCV 93.2 12/11/2016 1236   MCH 29.7 06/05/2017 1040   MCHC 33.3 10/10/2017 1014   RDW 13.9 10/10/2017 1014   RDW 13.5 12/11/2016 1236   LYMPHSABS 2,330 06/05/2017 1040   LYMPHSABS 2.4 12/11/2016 1236   MONOABS 0.7 12/11/2016 1236   EOSABS 692 (H) 06/05/2017 1040   EOSABS 0.7 (H) 12/11/2016 1236   BASOSABS 73 06/05/2017 1040   BASOSABS 0.0 12/11/2016 1236    Lithium Lvl  Date Value Ref Range Status  10/10/2017 0.7 0.6 - 1.2 mmol/L Final    Serum nortriptyline level on 150 mg a day was 109  No results found for: PHENYTOIN, PHENOBARB, VALPROATE, CBMZ   .res Assessment: Plan:    Lithium use  Major depressive disorder, recurrent episode, moderate (HCC)  Diffuse pain  Medication monitoring encounter  Lithium-induced tremor  Generalized anxiety disorder   Deborah Soto has chronic severe major depression and generalized anxiety that is treatment resistant and failed multiple medications ECT and TMS as noted above including SSRIs, try cyclic's, lithium, and atypical antipsychotics for depression..  Prognosis is guarded.  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  There has been a discussion about possible bipolar elements but she has no clear manic episodes unrelated to  medication changes.  She has features of atypical depression.  She has been seeing doctors at [facility] for her psychiatric care because of change in insurance.  She is not satisfied with that care and therefore she wants care here but wants to try to keep the appointments at a minimum because of cost.  Therefore consideration could be given to other trials of MAO inhibitors as they are classically more effective than other antidepressants and atypical depression.  She did fail a trial of Emsam.  Consideration could be given to using tablet version of selegiline and going to much higher doses or the use of Parnate or Nardil..  Since she was last seen she is switched to amitriptyline entirely.  Overall depression is improved in combination with the lithium.  She still has significant anxiety.  She is also having problems with tremor.  She is also having some cognitive problems.  Encouraged her to discontinue hydroxyzine as it is contributing to the cognitive problems especially in combination with amitriptyline which strong antihistamine  and anticholinergic side effects as well.  Plus is not really helping the anxiety that much.  Encourage physical activity and weight loss.  She is not particularly motivated and feels that is hard to accomplish because of chronic pain.  Chronic pain complicates her treatment of depression.  We will continue the lithium as it does help to reduce suicidal ideation and behavior. We need to check labs including lithium level, amitriptyline level, TSH, BMP as soon as possible.  If her lithium level is up we will reduce the dose in order to help with the tremor.  Increase propranolol to 60 mg twice daily.  Disc Se.  Disc risk with low BP and DM.  He has not been having any problems with low blood sugar.  This was a 30-minute appointment  Follow-up 6 weeks  Deborah Parents MD, [name]  No future appointments.  No orders of the defined types were placed in this  encounter.     -------------------------------

## 2018-12-09 ENCOUNTER — Telehealth: Payer: Self-pay | Admitting: Psychiatry

## 2018-12-09 NOTE — Telephone Encounter (Signed)
Please send the labwork request from 9/28 DOS to [facility]. She is going to go to [facility] lab on [address], [address] tomorrow am. Thanks.

## 2018-12-09 NOTE — Telephone Encounter (Signed)
Noted order should come across for any [facility] lab site.

## 2018-12-11 ENCOUNTER — Other Ambulatory Visit: Payer: Self-pay | Admitting: Psychiatry

## 2018-12-15 LAB — LITHIUM LEVEL: Lithium Lvl: 0.8 mmol/L (ref 0.6–1.2)

## 2018-12-15 LAB — AMITRIPTYLINE LEVEL
Amitriptyline and Nortriptyline: 286 mcg/L — ABNORMAL HIGH (ref 100–250)
Amitriptyline: 237 mcg/L
Nortriptyline-AMITR: 49 mcg/L

## 2018-12-15 LAB — BASIC METABOLIC PANEL WITH GFR
BUN: 10 mg/dL (ref 7–25)
CO2: 24 mmol/L (ref 20–32)
Calcium: 9.5 mg/dL (ref 8.6–10.4)
Chloride: 106 mmol/L (ref 98–110)
Creat: 0.61 mg/dL (ref 0.50–1.05)
GFR, Est African American: 122 mL/min/{1.73_m2} (ref >=60)
GFR, Est Non African American: 105 mL/min/{1.73_m2} (ref >=60)
Glucose, Bld: 159 mg/dL — ABNORMAL HIGH (ref 65–99)
Potassium: 4.1 mmol/L (ref 3.5–5.3)
Sodium: 140 mmol/L (ref 135–146)

## 2018-12-15 LAB — TSH: TSH: 1.7 mIU/L

## 2018-12-16 ENCOUNTER — Telehealth: Payer: Self-pay

## 2018-12-16 NOTE — Telephone Encounter (Signed)
Discussed all her lab results with her, she did increase her propranolol and hasn't seen any changes with tremors, in fact today was a bit more than usual. She will go ahead and reduce lithium 300 mg to 2.5 tablets daily, advised to call back with worsening symptoms or no improvement from medication changes. She did want me to pass along she took out the hydroxyzine at hs per recommendation.

## 2018-12-16 NOTE — Telephone Encounter (Signed)
-----   Message from [name]., MD sent at 12/15/2018 10:08 AM EDT ----- Lithium level stable at 0.8 in a good range.  The previous was 0.7.  She is having some tremor issues.  Normal BMP including excellent creatinine and calcium levels.  Blood sugar is high but she is aware of that problem.  TSH is within normal limits.  Serum amitriptyline level is 286 which is at the upper end of the normal range and suggestive that we not try further increase.  We will discuss further options at her follow-up appointment.  No medication adjustments required, but because of her tremor and her lithium level is slightly higher than it was with the prior level if she wants to reduce the lithium from 3 of the 300 mg tablets daily to 2-1/2 of the 300 mg tablets daily she can do so.  At her last appointment she was also encouraged to try a higher dose of propranolol and that may have resolved the problem.  If it did do not reduce the lithium but if it did not and she wants to reduce the dose she can do so as noted.  She should call us if she has any recurrence of symptoms.  [name], [name]

## 2018-12-17 ENCOUNTER — Other Ambulatory Visit: Payer: Self-pay | Admitting: Psychiatry

## 2018-12-17 NOTE — Telephone Encounter (Signed)
Change her instructions to 2.5 tablets or leave at 3 for lithium

## 2018-12-22 ENCOUNTER — Other Ambulatory Visit: Payer: Self-pay | Admitting: Psychiatry

## 2018-12-22 NOTE — Telephone Encounter (Signed)
Patient taking 60 mg bid now, change to 3 tablets bid?

## 2018-12-25 ENCOUNTER — Telehealth: Payer: Self-pay | Admitting: Psychiatry

## 2018-12-25 NOTE — Telephone Encounter (Signed)
It is generally true that you are not supposed to cut tablets that are extended release.  In this case it is not harmful.  If she uses a pill splitter she will be able to cut the tablets in half.  We are looking to see if we can reduce some side effects and if it does so we can look for a different combination of tablets sizes so she will have to cut them anymore.

## 2018-12-25 NOTE — Telephone Encounter (Signed)
Patient called and said that the lithium is extnede release and she is unable to cut the tablets. Just wanted you to know.

## 2018-12-26 NOTE — Telephone Encounter (Signed)
Left message to call back with instructions

## 2018-12-26 NOTE — Telephone Encounter (Signed)
Patient given instructions and agreed. Advised to call back with any concerns

## 2019-01-19 ENCOUNTER — Other Ambulatory Visit: Payer: Self-pay | Admitting: Psychiatry

## 2019-01-26 DIAGNOSIS — Z9989 Dependence on other enabling machines and devices: Secondary | ICD-10-CM | POA: Insufficient documentation

## 2019-01-26 DIAGNOSIS — E119 Type 2 diabetes mellitus without complications: Secondary | ICD-10-CM | POA: Diagnosis not present

## 2019-01-26 DIAGNOSIS — G4733 Obstructive sleep apnea (adult) (pediatric): Secondary | ICD-10-CM | POA: Insufficient documentation

## 2019-01-26 DIAGNOSIS — K760 Fatty (change of) liver, not elsewhere classified: Secondary | ICD-10-CM | POA: Diagnosis not present

## 2019-01-26 DIAGNOSIS — E1165 Type 2 diabetes mellitus with hyperglycemia: Secondary | ICD-10-CM | POA: Diagnosis not present

## 2019-01-26 DIAGNOSIS — E213 Hyperparathyroidism, unspecified: Secondary | ICD-10-CM | POA: Diagnosis not present

## 2019-01-26 DIAGNOSIS — R197 Diarrhea, unspecified: Secondary | ICD-10-CM | POA: Diagnosis not present

## 2019-01-26 DIAGNOSIS — F332 Major depressive disorder, recurrent severe without psychotic features: Secondary | ICD-10-CM | POA: Diagnosis not present

## 2019-01-26 LAB — COMPREHENSIVE METABOLIC PANEL
Albumin: 4.3 (ref 3.5–5.0)
Calcium: 9.9 (ref 8.7–10.7)

## 2019-01-26 LAB — CBC AND DIFFERENTIAL
HCT: 41 (ref 36–46)
HCT: 41 (ref 36–46)
Hemoglobin: 13.5 (ref 12.0–16.0)
Hemoglobin: 13.5 (ref 12.0–16.0)
Neutrophils Absolute: 5.5
Platelets: 251 (ref 150–399)
Platelets: 251 (ref 150–399)
WBC: 8.5

## 2019-01-26 LAB — HEPATIC FUNCTION PANEL
ALT: 27 (ref 7–35)
AST: 30 (ref 13–35)
Alkaline Phosphatase: 69 (ref 25–125)
Bilirubin, Total: 0.4

## 2019-01-26 LAB — IRON,TIBC AND FERRITIN PANEL
%SAT: 11
Ferritin: 25
Iron: 54
TIBC: 475
UIBC: 421

## 2019-01-26 LAB — BASIC METABOLIC PANEL
BUN: 13 (ref 4–21)
CO2: 24 — AB (ref 13–22)
Chloride: 102 (ref 99–108)
Creatinine: 0.7 (ref 0.5–1.1)
Glucose: 292
Potassium: 4.2 (ref 3.4–5.3)
Sodium: 139 (ref 137–147)

## 2019-01-26 LAB — CBC
RBC: 4.57 (ref 3.87–5.11)
RBC: 4.57 (ref 3.87–5.11)

## 2019-01-26 LAB — VITAMIN B12: Vitamin B-12: 405

## 2019-01-26 LAB — VITAMIN D 25 HYDROXY (VIT D DEFICIENCY, FRACTURES): Vit D, 25-Hydroxy: 77

## 2019-01-26 LAB — TSH: TSH: 2.52 (ref 0.41–5.90)

## 2019-01-26 LAB — LIPID PANEL
Cholesterol: 122 (ref 0–200)
HDL: 39 (ref 35–70)
LDL Cholesterol: 63
Triglycerides: 338 — AB (ref 40–160)

## 2019-01-30 ENCOUNTER — Telehealth: Payer: Self-pay | Admitting: Psychiatry

## 2019-01-30 NOTE — Telephone Encounter (Signed)
Based on the last conversation she was having a tremor which we feel like is related to lithium and it did not improve with propranolol and has not improved noticeably with the reduction in lithium to 750 mg daily.  Therefore if she would like to try reducing the lithium to 2 daily in order to reduce the tremor than she could do so.  Call us back if she notices any more mood symptoms such as depression, irritability, or worsening anxiety.

## 2019-01-30 NOTE — Telephone Encounter (Signed)
Pt. Made aware and will try this. She will call if any mood symptoms occur.

## 2019-01-30 NOTE — Telephone Encounter (Signed)
Patient called and had labs done at PCP and they did a lithium level. Her result was .7 She wants to know what you want her to do. Please give her a call at [phone]

## 2019-02-02 NOTE — Telephone Encounter (Signed)
Deborah Soto is stating that tremor is a little better 50% , but mood is not improved, very hard to keep it together. Very weepy, pity party, overreacting.

## 2019-02-02 NOTE — Telephone Encounter (Signed)
Patient's tremor is not noticeably better with reduction in lithium but her mood is worse.  Have her increase lithium back to 900 mg daily.  If propranolol 60 mg twice a day is somewhat helpful then continue it.  If is not helpful then discontinue it.  May retry gabapentin.  She took it before for anxiety but it is sometimes used for tremor.  Put her on the cancellation list for 30 minutes

## 2019-02-03 NOTE — Telephone Encounter (Signed)
Called Deborah Soto back and she agrees to the increase of lithium 900 mg, will make sure she's on cxl list as well. Nothing scheduled at this time due to having the [facility] but will come in as soon as she can.

## 2019-02-03 NOTE — Telephone Encounter (Signed)
She is failed to respond to lots of medications.  She is describing that her mood is worse since reducing the lithium.  Her depression is a greater health risk than is her tremor.  I would advise at least until her appointment that she increase her lithium back up to 900 mg daily.  My recollection is that we have done this before reducing the lithium and seen her mood get worse before and it appears to be doing so again.  I can see if I can find other options when we meet in person or at her next appointment.

## 2019-02-04 ENCOUNTER — Ambulatory Visit (INDEPENDENT_AMBULATORY_CARE_PROVIDER_SITE_OTHER): Payer: Self-pay | Admitting: Psychiatry

## 2019-02-04 ENCOUNTER — Other Ambulatory Visit: Payer: Self-pay

## 2019-02-04 ENCOUNTER — Encounter: Payer: Self-pay | Admitting: Psychiatry

## 2019-02-04 VITALS — BP 124/89 | HR 90

## 2019-02-04 DIAGNOSIS — Z79899 Other long term (current) drug therapy: Secondary | ICD-10-CM

## 2019-02-04 DIAGNOSIS — F411 Generalized anxiety disorder: Secondary | ICD-10-CM

## 2019-02-04 DIAGNOSIS — G251 Drug-induced tremor: Secondary | ICD-10-CM

## 2019-02-04 DIAGNOSIS — R52 Pain, unspecified: Secondary | ICD-10-CM

## 2019-02-04 DIAGNOSIS — F331 Major depressive disorder, recurrent, moderate: Secondary | ICD-10-CM

## 2019-02-04 DIAGNOSIS — G4710 Hypersomnia, unspecified: Secondary | ICD-10-CM

## 2019-02-04 MED ORDER — AMITRIPTYLINE HCL 50 MG PO TABS: 300.0000 mg | ORAL_TABLET | Freq: Every day | ORAL | 1 refills | Status: DC

## 2019-02-04 MED ORDER — PROPRANOLOL HCL 20 MG PO TABS: 80.0000 mg | ORAL_TABLET | Freq: Two times a day (BID) | ORAL | 1 refills | Status: DC

## 2019-02-04 MED ORDER — METHYLPHENIDATE HCL ER 27 MG PO TB24: 27.0000 mg | ORAL_TABLET | Freq: Every day | ORAL | 0 refills | Status: DC

## 2019-02-04 NOTE — Progress Notes (Signed)
Deborah Soto [id number] 1965-10-10 53 y.o.     Subjective:   Patient ID:  Deborah Soto is a 53 y.o. (DOB [birth year]) female.  Chief Complaint:  Chief Complaint  Patient presents with  . Follow-up    Medication Management  . Depression    Medication Management  . Medication Problem    Tremor   Depression        Associated symptoms include fatigue and myalgias.  Associated symptoms include no decreased concentration.    Deborah Soto presents to the office today for follow-up of TRD and anxiety, and insomnia.  Last seen December 08, 2018.  She was complaining of lithium tremor and some cognitive problems.  Hydroxyzine was stopped.  Propranolol increased to 60 mg twice daily for tremor.  Lithium level and amitriptyline levels were requested. Amitriptyline level to 37, nortriptyline 49 on amitriptyline 250 mg nightly. Lithium level 0.800 mg daily  Phone call on October 6 to discuss lab results as follows: Note   ----- Message from [name]., MD sent at 12/15/2018 10:08 AM EDT ----- Lithium level stable at 0.8 in a good range.  The previous was 0.7.  She is having some tremor issues.  Normal BMP including excellent creatinine and calcium levels.  Blood sugar is high but she is aware of that problem.  TSH is within normal limits.  Serum amitriptyline level is 286 which is at the upper end of the normal range and suggestive that we not try further increase.  We will discuss further options at her follow-up appointment.  No medication adjustments required, but because of her tremor and her lithium level is slightly higher than it was with the prior level if she wants to reduce the lithium from 3 of the 300 mg tablets daily to 2-1/2 of the 300 mg tablets daily she can do so.  At her last appointment she was also encouraged to try a higher dose of propranolol and that may have resolved the problem.  If it did do not reduce the lithium but if it did not and she  wants to reduce the dose she can do so as noted.  She should call us if she has any recurrence of symptoms.  Deborah Parents MD, [name]     She had repeat lithium level at 750 mg daily of 0.  7 with some improvement in tremor.  In phone call on November 20 she reported mood was worse with weepiness, overreacting, "pity party".  Because mood symptoms were worse with the reduction lithium she was encouraged to increase the dosage back to 900 mg daily.   Shaking got really bad and got lithium level as low as 600 mg but the next day felt really helpless and everything seemed to be aimed at hurting me and felt disrespected.  Problems with boss and 53 yo taking 5 AP classes and 2 volleyball classes.  I've been on her to accomplish all these things.  She claimed that pt screaming at her.  H CO she was more irritable after the reduction in lithium.    Attention problems more noticeable.  Still dropping and tremor.  Tolerated the increase in propranolol.    CO shakiness from lithium which interferes with typing. Also a good bit of anxiety generally without panic.  BS are high.  Taking hydroxyzine without help for anxity.    Sleeping better with amitriptyline.  To bed 11 and up 7 amd but some daytime sleepiness.  Working from home.  No  pain benefit but is sleeping better and still using CPAP.  Less awakening than before.  No SE. depression was worse after reducing the lithium.  Irritability was also worse after reducing the lithium to 600 mg daily.  Anxiety can be triggered over minor things.    No SI lately.   A lot of stressors but anxiety seems unrelatated.  Not Easily tearful.  Not a lot of SI but occ thoughts over tired of being stressed and chronic pain.  Pain never helped with meds.  Energy still low.  Not much enjoyment.  Does the things she has to do.  Don't like being around people.   Chronic pain still a problem. No benefit with TCA.  Seeing a new fourth rheumatologist.    Past Psychiatric  Medication Trials: Failed ECT and TMS, amitriptyline helped for 2 to 3 years in 1987, Trintellix, Emsam, duloxetine 120, sertraline, lithium SE, Latuda 120, lamotrigine 300,  Abilify 15, Seroquel 400, risperidone, Geodon, Vraylar, Rexulti, buspirone, modafinil, pramipexole,  Nuvigil,  buspirone, gabapentin, Lexapro 20, Fetzima,  Haldol for agitation, clonazepam,  Vyvanse, trazodone, Wellbutrin, fluoxetine, paroxetine, sertraline, propranolol Nortriptyline 200 with a therapeutic blood level. history of overdose on Xanax, There is a history of suicide attempts and psychiatric hospitalizations.  Review of Systems:  Review of Systems  Constitutional: Positive for fatigue.  Musculoskeletal: Positive for back pain and myalgias. Negative for gait problem.  Neurological: Positive for tremors.  Psychiatric/Behavioral: Positive for depression. Negative for confusion, decreased concentration, hallucinations and self-injury.    Medications: I have reviewed the patient's current medications.  Current Outpatient Medications  Medication Sig Dispense Refill  . Adapalene 0.3 % gel Apply topically at bed time. 45 g 0  . amitriptyline (ELAVIL) 50 MG tablet Take 6 tablets (300 mg total) by mouth at bedtime. 150 tablet 1  . atorvastatin (LIPITOR) 10 MG tablet TAKE 1 TABLET ONCE DAILY. 30 tablet 0  . Blood Glucose Monitoring Suppl (ONETOUCH VERIO) w/Device KIT     . celecoxib (CELEBREX) 200 MG capsule TAKE (1) CAPSULE TWICE DAILY. 60 capsule 0  . clindamycin (CLINDAGEL) 1 % gel APPLY TOPICALLY TWICE A DAY. 30 g 0  . diclofenac sodium (VOLTAREN) 1 % GEL Apply 4 g topically 4 (four) times daily as needed. 100 g 3  . lithium carbonate (LITHOBID) 300 MG CR tablet TAKE 3 TABLETS AT BEDTIME. (Patient taking differently: Take 900 mg by mouth at bedtime. ) 90 tablet 1  . metFORMIN (GLUCOPHAGE-XR) 500 MG 24 hr tablet TAKE (2) TABLETS TWICE DAILY. 120 tablet 0  . ONETOUCH DELICA LANCETS 24M MISC Ck Blood sugar up to 4  times a day as directed. ICD-10 E11.69, E66.9 400 each 0  . ONETOUCH VERIO test strip CHECK BLOOD SUGAR UP TO 4 TIMES A DAY AS DIRECTED. 100 each 3  . pantoprazole (PROTONIX) 40 MG tablet TAKE 1 TABLET ONCE DAILY. 30 tablet 0  . pregabalin (LYRICA) 75 MG capsule TAKE 3 CAPSULES TWICE A DAY. (Patient taking differently: 225 mg 2 (two) times daily. ) 180 capsule 0  . propranolol (INDERAL) 20 MG tablet Take 2-3 tablets (40-60 mg total) by mouth 2 (two) times daily as needed (Anxiety). 180 tablet 2  . Pyridoxine HCl (VITAMIN B-6) 500 MG tablet Take 1,000 mg by mouth daily.    . TRULICITY 1.5 PN/3.6RW SOPN INJECT 0.5ML ( 1.'5MG'$ ) INTO THE SKIN ONCE WEEKLY. 2 mL 0  . vitamin B-12 (CYANOCOBALAMIN) 1000 MCG tablet Take 1,000 mcg by mouth daily.    Marland Kitchen  Vitamin D, Ergocalciferol, (DRISDOL) 1.25 MG (50000 UT) CAPS capsule TAKE 1 CAPSULE 3 TIMES WEEKLY. (Patient taking differently: Take 50,000 Units by mouth every 7 (seven) days. ) 12 capsule 0  . XARELTO 10 MG TABS tablet TAKE 1 TABLET ONCE DAILY WITH FOOD. 30 tablet 0  . hydrOXYzine (ATARAX/VISTARIL) 25 MG tablet TAKE 1 TABLET EVERY 6 HOURS AS NEEDED FOR ANXIETY (Patient not taking: Reported on 02/04/2019) 60 tablet 2  . methylphenidate 27 MG PO TB24 Take 1 tablet (27 mg total) by mouth daily. 30 tablet 0  . propranolol (INDERAL) 20 MG tablet Take 4 tablets (80 mg total) by mouth 2 (two) times daily. 180 tablet 1   No current facility-administered medications for this visit.     Medication Side Effects: Sedation  Allergies:  Allergies  Allergen Reactions  . Tramadol Other (See Comments)    Tingling all over     Past Medical History:  Diagnosis Date  . Anxiety   . Asthma   . Depression   . Diabetes mellitus type 2 in obese ([place]) 06/11/2016  . Embolism - blood clot January 1997 & January 2017   Also had LLE DVT in 1997  . Hypertension   . Preeclampsia 10/10/2011   1999   . Sleep apnea     Family History  Problem Relation Age of Onset  .  Dementia Father   . Heart disease Father   . Clotting disorder Mother   . Arthritis Mother   . Clotting disorder Sister   . Clotting disorder Maternal Grandmother   . Clotting disorder Maternal Aunt   . Rheumatologic disease Neg Hx   . Hyperparathyroidism Neg Hx     Social History   Socioeconomic History  . Marital status: Married    Spouse name: Not on file  . Number of children: Y  . Years of education: Not on file  . Highest education level: Not on file  Occupational History  . Occupation: admin assist    Comment: Pharmacist, community  Social Needs  . Financial resource strain: Not on file  . Food insecurity    Worry: Not on file    Inability: Not on file  . Transportation needs    Medical: Not on file    Non-medical: Not on file  Tobacco Use  . Smoking status: Former Smoker    Packs/day: 1.00    Years: 20.00    Pack years: 20.00    Quit date: 03/12/2004    Years since quitting: 14.9  . Smokeless tobacco: Never Used  Substance and Sexual Activity  . Alcohol use: Yes    Alcohol/week: 1.0 standard drinks    Types: 1 Cans of beer per week    Comment: 1-2 times a week  . Drug use: No  . Sexual activity: Yes    Partners: Male    Birth control/protection: None  Lifestyle  . Physical activity    Days per week: Not on file    Minutes per session: Not on file  . Stress: Not on file  Relationships  . Social Herbalist on phone: Not on file    Gets together: Not on file    Attends religious service: Not on file    Active member of club or organization: Not on file    Attends meetings of clubs or organizations: Not on file    Relationship status: Not on file  . Intimate partner violence    Fear of current or ex partner:  Not on file    Emotionally abused: Not on file    Physically abused: Not on file    Forced sexual activity: Not on file  Other Topics Concern  . Not on file  Social History Narrative   Originally from [place]. Always lived in [place].  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 No international travel recently. Previously has been to [place] in May 1996. No mold exposure recently. No bird exposure. Does have multiple pets.     Past Medical History, Surgical history, Social history, and Family history were reviewed and updated as appropriate.   Please see review of systems for further details on the patient's review from today.   Objective:   Physical Exam:  BP 124/89   Pulse 90   LMP  (LMP Unknown) Comment: last period ---  1 year ago (?)  Physical Exam Constitutional:      General: She is not in acute distress.    Appearance: She is well-developed. She is obese.  Musculoskeletal:        General: No deformity.  Neurological:     Mental Status: She is alert and oriented to person, place, and time.     Cranial Nerves: No dysarthria.     Coordination: Coordination normal.  Psychiatric:        Attention and Perception: Attention and perception normal. She does not perceive auditory or visual hallucinations.        Mood and Affect: Mood is anxious and depressed. Affect is not labile, blunt, angry or inappropriate.        Speech: Speech normal.        Behavior: Behavior normal. Behavior is cooperative.        Thought Content: Thought content normal. Thought content is not paranoid or delusional. Thought content does not include homicidal or suicidal ideation. Thought content does not include homicidal or suicidal plan.        Cognition and Memory: Cognition and memory normal.        Judgment: Judgment normal.     Comments: Insight fair. She has  improvement in depression on amitriptyline and lithium.  Suicidal thoughts resolved on lithium.  She denies being agitated now but did have it at lithium 600 mg daily     Lab Review:     Component Value Date/Time   NA 140 12/11/2018 1121   NA 140 12/11/2016 1236   K 4.1 12/11/2018 1121   K 4.2 12/11/2016 1236   CL 106 12/11/2018 1121   CO2 24 12/11/2018 1121   CO2 24  12/11/2016 1236   GLUCOSE 159 (H) 12/11/2018 1121   GLUCOSE 109 12/11/2016 1236   BUN 10 12/11/2018 1121   BUN 15.1 12/11/2016 1236   CREATININE 0.61 12/11/2018 1121   CREATININE 0.8 12/11/2016 1236   CALCIUM 9.5 12/11/2018 1121   CALCIUM 10.0 12/11/2016 1236   PROT 6.7 06/05/2017 1040   PROT 7.5 12/11/2016 1236   ALBUMIN 4.4 03/21/2017 1210   ALBUMIN 4.4 12/11/2016 1236   AST 28 06/05/2017 1040   AST 38 (H) 12/11/2016 1236   ALT 39 (H) 06/05/2017 1040   ALT 61 (H) 12/11/2016 1236   ALKPHOS 70 03/21/2017 1210   ALKPHOS 71 12/11/2016 1236   BILITOT 0.4 06/05/2017 1040   BILITOT 0.62 12/11/2016 1236   GFRNONAA 105 12/11/2018 1121   GFRAA 122 12/11/2018 1121       Component Value Date/Time   WBC 9.7 10/10/2017 1014   RBC 4.65 10/10/2017 1014  HGB 14.2 10/10/2017 1014   HGB 14.5 12/11/2016 1236   HCT 42.5 10/10/2017 1014   HCT 43.9 12/11/2016 1236   PLT 304.0 10/10/2017 1014   PLT 289 12/11/2016 1236   PLT 295 07/24/2016 1848   MCV 91.4 10/10/2017 1014   MCV 93.2 12/11/2016 1236   MCH 29.7 06/05/2017 1040   MCHC 33.3 10/10/2017 1014   RDW 13.9 10/10/2017 1014   RDW 13.5 12/11/2016 1236   LYMPHSABS 2,330 06/05/2017 1040   LYMPHSABS 2.4 12/11/2016 1236   MONOABS 0.7 12/11/2016 1236   EOSABS 692 (H) 06/05/2017 1040   EOSABS 0.7 (H) 12/11/2016 1236   BASOSABS 73 06/05/2017 1040   BASOSABS 0.0 12/11/2016 1236    Lithium Lvl  Date Value Ref Range Status  12/11/2018 0.8 0.6 - 1.2 mmol/L Final    Serum nortriptyline level on 150 mg a day was 109  No results found for: PHENYTOIN, PHENOBARB, VALPROATE, CBMZ   .res Assessment: Plan:    Major depressive disorder, recurrent episode, moderate ([place]) - Plan: amitriptyline (ELAVIL) 50 MG tablet  Lithium-induced tremor - Plan: propranolol (INDERAL) 20 MG tablet  Generalized anxiety disorder - Plan: propranolol (INDERAL) 20 MG tablet, amitriptyline (ELAVIL) 50 MG tablet  Lithium use  Diffuse pain  Hypersomnolence -  Plan: methylphenidate 27 MG PO TB24   Leola has chronic severe major depression and generalized anxiety that is treatment resistant and failed multiple medications ECT and TMS as noted above including SSRIs, try cyclic's, lithium, and atypical antipsychotics for depression..  Prognosis is guarded.  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  There has been a discussion about possible bipolar elements but she has no clear manic episodes unrelated to medication changes.  She has features of atypical depression.  She has been seeing doctors at [facility] for her psychiatric care because of change in insurance.  She is not satisfied with that care and therefore she wants care here but wants to try to keep the appointments at a minimum because of cost.  Therefore consideration could be given to other trials of MAO inhibitors as they are classically more effective than other antidepressants and atypical depression.  She did fail a trial of Emsam.  Consideration could be given to using tablet version of selegiline and going to much higher doses or the use of Parnate or Nardil..  Overall depression is improved with amitriptyline in combination with the lithium.  She still has significant anxiety.  Irritability was worse at lower doses of lithium than 900 mg daily.  She is also having problems with tremor.  She is also having some cognitive problems.  Serum amitriptyline levels noted and discussed in detail.  They are in the high normal range but not near the toxic range of 500.  She would like to try increasing 1 further if she can tolerate it as far as the sleepiness is concerned.  Discussed one other augmentation strategy we have not used up to this point.  Discussed the multiple failed augmentation strategies listed above.  She has never taken methylphenidate.  This could help also with excessive sleepiness related to amitriptyline.  Encourage physical activity and weight loss.  She  is not particularly motivated and feels that is hard to accomplish because of chronic pain.  Chronic pain complicates her treatment of depression.  Lithium levels and amitriptyline levels were discussed in detail as noted.  Increase propranolol to 80 mg twice daily for lithium tremor and anxiety  her blood pressure and pulse appear high enough that she can tolerate this dosage.  Disc Se.  Disc risk with low BP and DM.  He has not been having any problems with low blood sugar.  Amitriptyline 6 or 300 mg daily if tolerated.  Try to take some about 4 -5 hours before sleep  Try to take lithium also 4-5 hours before sleep to minimize daytime tremor  Add methylphenidate ER 27 mg in the morning.  If no response we will increase the dosage.  This was a 30-minute appointment  Follow-up 6 weeks  Deborah Parents MD, [name]  Future Appointments  Date Time Provider [place]  04/08/2019  9:30 AM Cottle, Billey Co., MD [facility] None    No orders of the defined types were placed in this encounter.     -------------------------------

## 2019-02-04 NOTE — Patient Instructions (Signed)
Propranolol 80 mg twice daily for lithium tremor  Amitriptyline 6 daily if tolerated.  Try to take some about 4 -5 hours before sleep  Try to take lithium also 4-5 hours before sleep to minimize daytime tremor  Add methylphenidate ER in the morning

## 2019-02-21 ENCOUNTER — Other Ambulatory Visit: Payer: Self-pay | Admitting: Psychiatry

## 2019-02-22 NOTE — Telephone Encounter (Signed)
Apt 01/27 

## 2019-02-26 LAB — HM DIABETES EYE EXAM

## 2019-03-16 ENCOUNTER — Other Ambulatory Visit: Payer: Self-pay | Admitting: Psychiatry

## 2019-04-08 ENCOUNTER — Other Ambulatory Visit: Payer: Self-pay

## 2019-04-08 ENCOUNTER — Ambulatory Visit: Payer: Self-pay | Admitting: Psychiatry

## 2019-04-10 ENCOUNTER — Encounter: Payer: Self-pay | Admitting: Family Medicine

## 2019-04-13 ENCOUNTER — Other Ambulatory Visit: Payer: Self-pay

## 2019-04-13 ENCOUNTER — Other Ambulatory Visit: Payer: Self-pay | Admitting: Psychiatry

## 2019-04-13 DIAGNOSIS — F411 Generalized anxiety disorder: Secondary | ICD-10-CM

## 2019-04-13 DIAGNOSIS — F331 Major depressive disorder, recurrent, moderate: Secondary | ICD-10-CM

## 2019-04-13 MED ORDER — METHYLPHENIDATE HCL ER (OSM) 27 MG PO TBCR: 27.0000 mg | EXTENDED_RELEASE_TABLET | Freq: Every day | ORAL | 0 refills | Status: DC

## 2019-04-13 NOTE — Telephone Encounter (Signed)
Patient called and said she needs a refill on her methlphendate 27 mg sent to [facility]. Next appt 2/23

## 2019-04-13 NOTE — Telephone Encounter (Signed)
Pended her methylphenidate 27 mg to be submitted by Dr. Jennelle Human. Last refill 03/10/2019

## 2019-05-08 ENCOUNTER — Ambulatory Visit (INDEPENDENT_AMBULATORY_CARE_PROVIDER_SITE_OTHER): Payer: BC Managed Care – PPO | Admitting: Psychiatry

## 2019-05-08 ENCOUNTER — Other Ambulatory Visit: Payer: Self-pay

## 2019-05-08 ENCOUNTER — Encounter: Payer: Self-pay | Admitting: Psychiatry

## 2019-05-08 DIAGNOSIS — F331 Major depressive disorder, recurrent, moderate: Secondary | ICD-10-CM | POA: Diagnosis not present

## 2019-05-08 NOTE — Patient Instructions (Addendum)
Increase methylphenidate ER 36 mg in the morning.  Reduce propranolol to 60 mg twice daily   Restart B6 for lithium tremor. 500 mg BID.  Wait 1-2 weeks then reduce amitriptyline to 5 tablets nightly to see if cognition is better.  Spravato option

## 2019-05-08 NOTE — Progress Notes (Signed)
Deborah Soto [id number] Dec 25, 1965 54 y.o.     Subjective:   Patient ID:  Deborah Soto is a 54 y.o. (DOB January 19, 1966) female.  Chief Complaint:  No chief complaint on file.  Depression        Associated symptoms include fatigue and myalgias.  Associated symptoms include no decreased concentration.    Deborah Soto presents to the office today for follow-up of TRD and anxiety, and insomnia.  seen December 08, 2018.  She was complaining of lithium tremor and some cognitive problems.  Hydroxyzine was stopped.  Propranolol increased to 60 mg twice daily for tremor.  Lithium level and amitriptyline levels were requested. Amitriptyline level to 37, nortriptyline 49 on amitriptyline 250 mg nightly. Lithium level 0.800 mg daily  Phone call on October 6 to discuss lab results as follows: Note   ----- Message from [name]., MD sent at 12/15/2018 10:08 AM EDT ----- Lithium level stable at 0.8 in a good range.  The previous was 0.7.  She is having some tremor issues.  Normal BMP including excellent creatinine and calcium levels.  Blood sugar is high but she is aware of that problem.  TSH is within normal limits.  Serum amitriptyline level is 286 which is at the upper end of the normal range and suggestive that we not try further increase.  We will discuss further options at her follow-up appointment.  No medication adjustments required, but because of her tremor and her lithium level is slightly higher than it was with the prior level if she wants to reduce the lithium from 3 of the 300 mg tablets daily to 2-1/2 of the 300 mg tablets daily she can do so.  At her last appointment she was also encouraged to try a higher dose of propranolol and that may have resolved the problem.  If it did do not reduce the lithium but if it did not and she wants to reduce the dose she can do so as noted.  She should call us if she has any recurrence of symptoms.  [name], [name]      She had repeat lithium level at 750 mg daily of 0.  7 with some improvement in tremor.  In phone call on November 20 she reported mood was worse with weepiness, overreacting, "pity party".  Because mood symptoms were worse with the reduction lithium she was encouraged to increase the dosage back to 900 mg daily.   Last seen February 04, 2019.  The following changes were made: Increase propranolol to 80 mg twice daily for lithium tremor and anxiety her blood pressure and pulse appear high enough that she can tolerate this dosage.  Disc Se.  Disc risk with low BP and DM.  He has not been having any problems with low blood sugar. Amitriptyline 6 or 300 mg daily if tolerated.  Try to take some about 4 -5 hours before sleep Try to take lithium also 4-5 hours before sleep to minimize daytime tremor Add methylphenidate ER 27 mg in the morning.  If no response we will increase the dosage.  Seen May 08, 2019 with her husband. Aside from physically feeling like she can't do much then having memory issues.  Forgetful.  Loses track of thoughts between tasks.  Most embarrassing being around people and word-finding issues.   Problems with walking bc balance and weakness.  Fears falling. Only fall at Xmas morning.  Tremor is awful.  Affects keying.   H notices cognitive problems  and forgetfulness.    Shaking got really bad and got lithium level as low as 600 mg but the next day felt really helpless and everything seemed to be aimed at hurting me and felt disrespected.  Problems with boss and 54 yo taking 5 AP classes and 2 volleyball classes.  I've been on her to accomplish all these things.  She claimed that pt screaming at her.   H CO she was more irritable after the reduction in lithium.   H CO her anxiety also seems worse.  H says the last year has been unhealthy with 12 hour work days for her.   Doesn't go out much.    Attention problems more noticeable.  Still dropping and tremor.  Tolerated  the increase in propranolol.    CO shakiness from lithium which interferes with typing. Also a good bit of anxiety generally without panic.  BS are high.  Taking hydroxyzine without help for anxiety.    Still Sleeping better with amitriptyline.  To bed 11 and up 7 amd but some daytime sleepiness. Sleep 7-8 hours. Working from home.  No pain benefit but is sleeping better and still using CPAP.  Using CPAP regularly. Less awakening than before.  No SE. depression was worse after reducing the lithium.  Irritability was also worse after reducing the lithium to 600 mg daily.  Anxiety can be triggered over minor things.    No SI lately.   A lot of stressors but anxiety seems unrelatated.  Not Easily tearful.  Not a lot of SI but occ thoughts over tired of being stressed and chronic pain.  Pain never helped with meds.  Energy still low.  Not much enjoyment.  Does the things she has to do.  Don't like being around people.   Chronic pain still a problem. No benefit with TCA.  Seeing a new fourth rheumatologist.    Past Psychiatric Medication Trials: Failed ECT and TMS, amitriptyline helped for 2 to 3 years in 1987, Trintellix, Emsam, duloxetine 120, sertraline, lithium SE, Latuda 120, lamotrigine 300,  Abilify 15, Seroquel 400, risperidone, Geodon, Vraylar, Rexulti, buspirone, modafinil, pramipexole,  Nuvigil,  buspirone, gabapentin, Lexapro 20, Fetzima,  Haldol for agitation, clonazepam,  Vyvanse, trazodone, Wellbutrin, fluoxetine, paroxetine, sertraline, propranolol Nortriptyline 200 with a therapeutic blood level. history of overdose on Xanax, There is a history of suicide attempts and psychiatric hospitalizations.  Review of Systems:  Review of Systems  Constitutional: Positive for fatigue.  Musculoskeletal: Positive for back pain and myalgias. Negative for gait problem.  Neurological: Positive for tremors and weakness. Negative for dizziness.  Psychiatric/Behavioral: Positive for depression.  Negative for confusion, decreased concentration, hallucinations and self-injury. The patient is nervous/anxious.     Medications: I have reviewed the patient's current medications.  Current Outpatient Medications  Medication Sig Dispense Refill  . Adapalene 0.3 % gel Apply topically at bed time. 45 g 0  . amitriptyline (ELAVIL) 50 MG tablet TAKE SIX (6) TABLETS AT BEDTIME (Patient taking differently: Take 250 mg by mouth at bedtime. ) 180 tablet 0  . atorvastatin (LIPITOR) 10 MG tablet TAKE 1 TABLET ONCE DAILY. 30 tablet 0  . Blood Glucose Monitoring Suppl (ONETOUCH VERIO) w/Device KIT     . celecoxib (CELEBREX) 200 MG capsule TAKE (1) CAPSULE TWICE DAILY. 60 capsule 0  . clindamycin (CLINDAGEL) 1 % gel APPLY TOPICALLY TWICE A DAY. 30 g 0  . diclofenac sodium (VOLTAREN) 1 % GEL Apply 4 g topically 4 (four) times daily as needed.  100 g 3  . lithium carbonate (LITHOBID) 300 MG CR tablet TAKE 3 TABLETS AT BEDTIME. 90 tablet 0  . metFORMIN (GLUCOPHAGE-XR) 500 MG 24 hr tablet TAKE (2) TABLETS TWICE DAILY. 120 tablet 0  . methylphenidate 27 MG PO CR tablet Take 1 tablet (27 mg total) by mouth daily. 30 tablet 0  . methylphenidate 27 MG PO TB24 Take 1 tablet (27 mg total) by mouth daily. 30 tablet 0  . ONETOUCH DELICA LANCETS 09M MISC Ck Blood sugar up to 4 times a day as directed. ICD-10 E11.69, E66.9 400 each 0  . ONETOUCH VERIO test strip CHECK BLOOD SUGAR UP TO 4 TIMES A DAY AS DIRECTED. 100 each 3  . pantoprazole (PROTONIX) 40 MG tablet TAKE 1 TABLET ONCE DAILY. 30 tablet 0  . pregabalin (LYRICA) 75 MG capsule TAKE 3 CAPSULES TWICE A DAY. (Patient taking differently: 225 mg 2 (two) times daily. ) 180 capsule 0  . propranolol (INDERAL) 20 MG tablet Take 2-3 tablets (40-60 mg total) by mouth 2 (two) times daily as needed (Anxiety). (Patient taking differently: Take 80 mg by mouth 2 (two) times daily. ) 180 tablet 2  . propranolol (INDERAL) 20 MG tablet Take 4 tablets (80 mg total) by mouth 2 (two)  times daily. 180 tablet 1  . propranolol (INDERAL) 20 MG tablet TAKE 4 TABLETS TWICE DAILY. 076 tablet 0  . TRULICITY 1.5 KG/8.8PJ SOPN INJECT 0.5ML ( 1.5MG) INTO THE SKIN ONCE WEEKLY. 2 mL 0  . vitamin B-12 (CYANOCOBALAMIN) 1000 MCG tablet Take 1,000 mcg by mouth daily.    . Vitamin D, Ergocalciferol, (DRISDOL) 1.25 MG (50000 UT) CAPS capsule TAKE 1 CAPSULE 3 TIMES WEEKLY. (Patient taking differently: Take 50,000 Units by mouth every 7 (seven) days. ) 12 capsule 0  . XARELTO 10 MG TABS tablet TAKE 1 TABLET ONCE DAILY WITH FOOD. 30 tablet 0  . hydrOXYzine (ATARAX/VISTARIL) 25 MG tablet TAKE 1 TABLET EVERY 6 HOURS AS NEEDED FOR ANXIETY (Patient not taking: Reported on 05/08/2019) 60 tablet 2  . Pyridoxine HCl (VITAMIN B-6) 500 MG tablet Take 1,000 mg by mouth daily.     No current facility-administered medications for this visit.    Medication Side Effects: Sedation  Allergies:  Allergies  Allergen Reactions  . Tramadol Other (See Comments)    Tingling all over     Past Medical History:  Diagnosis Date  . Anxiety   . Asthma   . Depression   . Diabetes mellitus type 2 in obese ([place]) 06/11/2016  . Embolism - blood clot January 1997 & January 2017   Also had LLE DVT in 1997  . Hypertension   . Preeclampsia 10/10/2011   1999   . Sleep apnea     Family History  Problem Relation Age of Onset  . Dementia Father   . Heart disease Father   . Clotting disorder Mother   . Arthritis Mother   . Clotting disorder Sister   . Clotting disorder Maternal Grandmother   . Clotting disorder Maternal Aunt   . Rheumatologic disease Neg Hx   . Hyperparathyroidism Neg Hx     Social History   Socioeconomic History  . Marital status: Married    Spouse name: Not on file  . Number of children: Y  . Years of education: Not on file  . Highest education level: Not on file  Occupational History  . Occupation: admin assist    Comment: Pharmacist, community  Tobacco Use  . Smoking status:  Former Smoker    Packs/day: 1.00    Years: 20.00    Pack years: 20.00    Quit date: 03/12/2004    Years since quitting: 15.1  . Smokeless tobacco: Never Used  Substance and Sexual Activity  . Alcohol use: Yes    Alcohol/week: 1.0 standard drinks    Types: 1 Cans of beer per week    Comment: 1-2 times a week  . Drug use: No  . Sexual activity: Yes    Partners: Male    Birth control/protection: None  Other Topics Concern  . Not on file  Social History Narrative   Originally from [place]. Always lived in [place]. 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 No international travel recently. Previously has been to [place] in May 1996. No mold exposure recently. No bird exposure. Does have multiple pets.    Social Determinants of Health   Financial Resource Strain:   . Difficulty of Paying Living Expenses: Not on file  Food Insecurity:   . Worried About Charity fundraiser in the Last Year: Not on file  . Ran Out of Food in the Last Year: Not on file  Transportation Needs:   . Lack of Transportation (Medical): Not on file  . Lack of Transportation (Non-Medical): Not on file  Physical Activity:   . Days of Exercise per Week: Not on file  . Minutes of Exercise per Session: Not on file  Stress:   . Feeling of Stress : Not on file  Social Connections:   . Frequency of Communication with Friends and Family: Not on file  . Frequency of Social Gatherings with Friends and Family: Not on file  . Attends Religious Services: Not on file  . Active Member of Clubs or Organizations: Not on file  . Attends Archivist Meetings: Not on file  . Marital Status: Not on file  Intimate Partner Violence:   . Fear of Current or Ex-Partner: Not on file  . Emotionally Abused: Not on file  . Physically Abused: Not on file  . Sexually Abused: Not on file    Past Medical History, Surgical history, Social history, and Family history were reviewed and updated as appropriate.   Please see review of  systems for further details on the patient's review from today.   Objective:   Physical Exam:  LMP  (LMP Unknown) Comment: last period ---  1 year ago (?)  Physical Exam Constitutional:      General: She is not in acute distress.    Appearance: She is well-developed. She is obese.  Musculoskeletal:        General: No deformity.  Neurological:     Mental Status: She is alert and oriented to person, place, and time.     Cranial Nerves: No dysarthria.     Coordination: Coordination normal.  Psychiatric:        Attention and Perception: Attention and perception normal. She does not perceive auditory or visual hallucinations.        Mood and Affect: Mood is anxious and depressed. Affect is not labile, blunt, angry or inappropriate.        Speech: Speech normal.        Behavior: Behavior normal. Behavior is cooperative.        Thought Content: Thought content normal. Thought content is not paranoid or delusional. Thought content does not include homicidal or suicidal ideation. Thought content does not include homicidal or suicidal plan.  Cognition and Memory: Cognition and memory normal.        Judgment: Judgment normal.     Comments: Insight fair. She has  improvement in depression on amitriptyline and lithium.  Suicidal thoughts resolved on lithium.  She denies being agitated now but did have it at lithium 600 mg daily     Lab Review:     Component Value Date/Time   NA 140 12/11/2018 1121   NA 140 12/11/2016 1236   K 4.1 12/11/2018 1121   K 4.2 12/11/2016 1236   CL 106 12/11/2018 1121   CO2 24 12/11/2018 1121   CO2 24 12/11/2016 1236   GLUCOSE 159 (H) 12/11/2018 1121   GLUCOSE 109 12/11/2016 1236   BUN 10 12/11/2018 1121   BUN 15.1 12/11/2016 1236   CREATININE 0.61 12/11/2018 1121   CREATININE 0.8 12/11/2016 1236   CALCIUM 9.5 12/11/2018 1121   CALCIUM 10.0 12/11/2016 1236   PROT 6.7 06/05/2017 1040   PROT 7.5 12/11/2016 1236   ALBUMIN 4.4 03/21/2017 1210    ALBUMIN 4.4 12/11/2016 1236   AST 28 06/05/2017 1040   AST 38 (H) 12/11/2016 1236   ALT 39 (H) 06/05/2017 1040   ALT 61 (H) 12/11/2016 1236   ALKPHOS 70 03/21/2017 1210   ALKPHOS 71 12/11/2016 1236   BILITOT 0.4 06/05/2017 1040   BILITOT 0.62 12/11/2016 1236   GFRNONAA 105 12/11/2018 1121   GFRAA 122 12/11/2018 1121       Component Value Date/Time   WBC 9.7 10/10/2017 1014   RBC 4.65 10/10/2017 1014   HGB 14.2 10/10/2017 1014   HGB 14.5 12/11/2016 1236   HCT 42.5 10/10/2017 1014   HCT 43.9 12/11/2016 1236   PLT 304.0 10/10/2017 1014   PLT 289 12/11/2016 1236   PLT 295 07/24/2016 1848   MCV 91.4 10/10/2017 1014   MCV 93.2 12/11/2016 1236   MCH 29.7 06/05/2017 1040   MCHC 33.3 10/10/2017 1014   RDW 13.9 10/10/2017 1014   RDW 13.5 12/11/2016 1236   LYMPHSABS 2,330 06/05/2017 1040   LYMPHSABS 2.4 12/11/2016 1236   MONOABS 0.7 12/11/2016 1236   EOSABS 692 (H) 06/05/2017 1040   EOSABS 0.7 (H) 12/11/2016 1236   BASOSABS 73 06/05/2017 1040   BASOSABS 0.0 12/11/2016 1236    Lithium Lvl  Date Value Ref Range Status  12/11/2018 0.8 0.6 - 1.2 mmol/L Final    Serum nortriptyline level on 150 mg a day was 109  No results found for: PHENYTOIN, PHENOBARB, VALPROATE, CBMZ   .res Assessment: Plan:    No diagnosis found.   Deborah Soto has chronic severe major depression and generalized anxiety that is treatment resistant and failed multiple medications ECT and [place] as noted above including SSRIs, try cyclic's, lithium, and atypical antipsychotics for depression.. She has had partial benefit from amitriptyline plus lithium.  However she is having tremor problems from the lithium.  Efforts to reduce the lithium have led to worsening psychiatric symptoms.  There has been a discussion about possible bipolar elements but she has no clear manic episodes unrelated to medication changes.  She has features of atypical depression.   Discussed Spravato option in detail.  We will discuss it again  at follow-up appointment.  She was given information to read in the interim and the treatment plan was discussed in terms of frequency and side effects.  Therefore consideration could be given to other trials of MAO inhibitors as they are classically more effective than other antidepressants and atypical depression.  She did fail a trial of Emsam.  Consideration could be given to using tablet version of selegiline and going to much higher doses or the use of Parnate or Nardil..  Overall depression is improved with amitriptyline in combination with the lithium.  She still has significant anxiety.  Irritability was worse at lower doses of lithium than 900 mg daily.  She is also having problems with tremor.  She is also having some cognitive problems.  Serum amitriptyline levels noted and discussed in detail at the last appointment.  They are in the high normal range but not near the toxic range of 500.    Discussed one other augmentation strategy we have not used up to this point.  Discussed the multiple failed augmentation strategies listed above.  She is trying methylphenidate.  This could help also with excessive sleepiness related to amitriptyline.  She noted no benefit nor side effects from 27 mg daily.  Encourage physical activity and weight loss.  She is not particularly motivated and feels that is hard to accomplish because of chronic pain.  Chronic pain complicates her treatment of depression.  Reduce propranolol to 60 mg twice daily for lithium tremor and anxiety since the increase was not helpful.  her blood pressure and pulse appear high enough that she can tolerate this dosage.  Disc Se.  Disc risk with low BP and DM.  He has not been having any problems with low blood sugar.  Restart B6 for lithium tremor. 500 mg BID.  Amitriptyline  or 300 mg daily if tolerated.  Try to take some about 4 -5 hours before sleep  Try to take lithium also 4-5 hours before sleep to minimize daytime tremor.  depression was worse after reducing the lithium.  Irritability was also worse after reducing the lithium to 600 mg daily. So reluctant to reduce it further.  It is unclear how to address the benefits of lithium without using lithium other than to consider Spravato  Increase methylphenidate ER 36 mg in the morning.  If no response we will increase the dosage.  Disc risk anxiety and call if needed.    Plan: Increase methylphenidate ER 36 mg in the morning.  Reduce propranolol to 60 mg twice daily   Restart B6 for lithium tremor. 500 mg BID.  Wait 1-2 weeks then reduce amitriptyline to 5 tablets nightly to see if cognition is better.  Follow-up 6 weeks  [name], [name]  No future appointments.  No orders of the defined types were placed in this encounter.     -------------------------------

## 2019-05-13 ENCOUNTER — Telehealth: Payer: Self-pay | Admitting: Psychiatry

## 2019-05-13 ENCOUNTER — Other Ambulatory Visit: Payer: Self-pay

## 2019-05-13 MED ORDER — METHYLPHENIDATE HCL ER (OSM) 36 MG PO TBCR: 36.0000 mg | EXTENDED_RELEASE_TABLET | Freq: Every day | ORAL | 0 refills | Status: DC

## 2019-05-13 NOTE — Telephone Encounter (Signed)
Pt called. Seen 2/26 Provider changed methylphemidate ER 36 mg in morning and need Rx sent in @ [facility].

## 2019-05-13 NOTE — Telephone Encounter (Signed)
Spoke to patient and she stated you all discussed her issues with Loss of balance, Tremors, Day time sleepiness, Word & thought recall. Also some forgetfulness.

## 2019-05-13 NOTE — Telephone Encounter (Signed)
Pt had a visit with Dr Jennelle Human on Friday and pt was told to go to Dr.Dohmier for an appointment. Pt was told by Dr.Dohmier's office that she needs a ref from Dr. Jennelle Human.  Fax # for [employer] [phone]

## 2019-05-13 NOTE — Telephone Encounter (Signed)
Dosage increase methylphenidate to 36 mg daily per the last progress note

## 2019-05-13 NOTE — Telephone Encounter (Signed)
Apologize for me that while I know we discussed this I didn't write down the specific reason for the referral.  Dr. Vickey Huger is neurologist that specializes in sleep medicine.  I know she has OSA but ask her to remind me of the problems she wants the neuro to address.  Need this info for the referral.

## 2019-05-13 NOTE — Telephone Encounter (Signed)
Last refill 04/13/2019, pended for Dr. Jennelle Human to submit Has apt 03/25

## 2019-05-18 ENCOUNTER — Other Ambulatory Visit: Payer: Self-pay | Admitting: Psychiatry

## 2019-05-18 DIAGNOSIS — F411 Generalized anxiety disorder: Secondary | ICD-10-CM

## 2019-05-18 DIAGNOSIS — F331 Major depressive disorder, recurrent, moderate: Secondary | ICD-10-CM

## 2019-05-18 NOTE — Telephone Encounter (Signed)
Pt following up on the status of the referral from Dr Jennelle Human.

## 2019-05-18 NOTE — Telephone Encounter (Signed)
I haven't forgotten.  Working on this

## 2019-05-22 NOTE — Telephone Encounter (Signed)
Please let the patient know the referral to Dr. Vickey Soto has been sent.  If she does not hear from somebody by Wednesday then let us know.

## 2019-05-22 NOTE — Telephone Encounter (Signed)
Pt. Made aware.

## 2019-05-28 ENCOUNTER — Telehealth: Payer: Self-pay | Admitting: Psychiatry

## 2019-05-28 NOTE — Telephone Encounter (Signed)
Deborah Soto has been scheduled with [facility] with Dr.Dohmeir for 06/02/19 for Sleep consult

## 2019-06-01 ENCOUNTER — Telehealth: Payer: Self-pay | Admitting: Neurology

## 2019-06-01 NOTE — Telephone Encounter (Signed)
Deborah Soto, Deborah Ready., Deborah Soto   Deborah Soto, Deborah Mylar, Deborah Soto; Deborah Soto    Please evaluate this patient who has sleep apnea.   There is a question as to whether the apnea treatment at is adequate. She complains of excessive daytime drowsiness.  I will have my staff send a proper referral.  Thank you,  Deborah Staggers Deborah Soto, [name]    Dear

## 2019-06-02 ENCOUNTER — Telehealth: Payer: Self-pay | Admitting: Neurology

## 2019-06-02 ENCOUNTER — Encounter: Payer: Self-pay | Admitting: Neurology

## 2019-06-02 ENCOUNTER — Other Ambulatory Visit: Payer: Self-pay

## 2019-06-02 ENCOUNTER — Ambulatory Visit: Payer: BC Managed Care – PPO | Admitting: Neurology

## 2019-06-02 VITALS — BP 113/74 | HR 83 | Temp 97.6°F | Ht 68.0 in | Wt 259.0 lb

## 2019-06-02 DIAGNOSIS — G251 Drug-induced tremor: Secondary | ICD-10-CM | POA: Diagnosis not present

## 2019-06-02 DIAGNOSIS — G252 Other specified forms of tremor: Secondary | ICD-10-CM | POA: Insufficient documentation

## 2019-06-02 DIAGNOSIS — F5111 Primary hypersomnia: Secondary | ICD-10-CM | POA: Diagnosis not present

## 2019-06-02 DIAGNOSIS — Z9889 Other specified postprocedural states: Secondary | ICD-10-CM

## 2019-06-02 DIAGNOSIS — R413 Other amnesia: Secondary | ICD-10-CM

## 2019-06-02 MED ORDER — MODAFINIL 100 MG PO TABS: 100.0000 mg | ORAL_TABLET | Freq: Every day | ORAL | 0 refills | Status: DC

## 2019-06-02 NOTE — Telephone Encounter (Signed)
Pt is coming in and is scheduled today.

## 2019-06-02 NOTE — Progress Notes (Signed)
SLEEP MEDICINE CLINIC    Provider:  Larey Seat, MD  Primary Care Physician:  Vivi Barrack, [address] [place] [address]     Referring Provider: Dr Clovis Pu, MD          Chief Complaint according to patient   Patient presents with:    . New Patient (Initial Visit)           HISTORY OF PRESENT ILLNESS:  Deborah Soto is a 54 y.o.  Caucasian female patient and is seen I upon referral by Dr. Clovis Pu on 06/02/2019 - for an insomnia and fatigue evaluation.  Chief concern according to patient :  I am actually sleeping good since Dr Clovis Pu order CPAP for me, I had a HST- 2 years ago. I am fatigued, still- and EDS. Her daughter was 62 last year when she had to driver her mother back from a tournament in [place]. .    I have the pleasure of seeing Deborah Soto, a right -handed Caucasian female with sleep concerns that have become more problematic over the pandemic. She sleeps at her desk, fell asleep at her daughter's basketball game with thousands of people in the audience.  She   has a past medical history of Anxiety, Asthma, Depression, Diabetes mellitus type 2 in obese [facility]) (06/11/2016), Embolism - blood clot (January 1997 & January 2017), Hypertension, Preeclampsia (10/10/2011), and Sleep apnea.. Bipolar depression on Lithium, lithium tremor. 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 Ritalin.  She has been through ECT ( memory!!!) and through [employer] stimulation.    The patient had the HST sleep study in the year 3-5- 2018 with Dr Tonia Ghent in [place], [place] [facility]-  with a result of apnea being present, [facility] issued the machine.  Her medication induced tremor has been seen by Dr Tat within 3 years. .   Sleep relevant medical history:  CPAP helped insomnia, and initially helped fatigue.      Family medical /sleep history: sister and father with OSA.   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 Family status is married with 2 daughters and 1 son.  Pets are present. They have 3 cats and a dog.  Tobacco use: quit at age 40.   ETOH use : socially ,  Caffeine intake in form of Coffee( 1 cup ) Soda( 1 big  ) Tea ( ), no energy drinks. Regular exercise in form of -n/a       Sleep habits are as follows: The patient's dinner time is between 7.30-8  PM. The patient goes to bed at 11.30 PM and she sleeps well- continues to sleep for 7-8 hours.   The preferred sleep position is sideways, with the support of 3 pillows. Dreams are reportedly frequent/not vivid.  8.30 AM is the usual rise time. The patient wakes up with her husband waking her.   If not woken, she may sleep until 11 AM.  She reports not feeling refreshed or restored in AM, with symptoms such as dry mouth,  and residual fatigue. Naps are taken infrequently, she avoid naps, as long as she can- 10 minutes power-nap.    Review of Systems: Out of a complete 14 system review, the patient complains of only the following symptoms, and all other reviewed systems are negative.:  Fatigue, sleepiness , snoring, no nocturia and no fragmented sleep, no Insomnia on CPAP    How likely are you  to doze in the following situations: 0 = not likely, 1 = slight chance, 2 = moderate chance, 3 = high chance   Sitting and Reading? Watching Television? Sitting inactive in a public place (theater or meeting)? As a passenger in a car for an hour without a break? Lying down in the afternoon when circumstances permit? Sitting and talking to someone? Sitting quietly after lunch without alcohol? In a car, while stopped for a few minutes in traffic?   Total =14/ 24 points   FSS endorsed at 45/ 63 points.   Social History   Socioeconomic History  . Marital status: Married    Spouse name: Not on file  . Number of children: Y  . Years of education: Not on file  . Highest education level: Not on file  Occupational History  . Occupation:  admin assist    Comment: Pharmacist, community  Tobacco Use  . Smoking status: Former Smoker    Packs/day: 1.00    Years: 20.00    Pack years: 20.00    Quit date: 03/12/2004    Years since quitting: 15.2  . Smokeless tobacco: Never Used  Substance and Sexual Activity  . Alcohol use: Yes    Alcohol/week: 1.0 standard drinks    Types: 1 Cans of beer per week    Comment: 1-2 times a week  . Drug use: No  . Sexual activity: Yes    Partners: Male    Birth control/protection: None  Other Topics Concern  . Not on file  Social History Narrative   Originally from [place]. Always lived in [place]. 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 No international travel recently. Previously has been to [place] in May 1996. No mold exposure recently. No bird exposure. Does have multiple pets.    Social Determinants of Health   Financial Resource Strain:   . Difficulty of Paying Living Expenses:   Food Insecurity:   . Worried About Charity fundraiser in the Last Year:   . Arboriculturist in the Last Year:   Transportation Needs:   . Film/video editor (Medical):   Marland Kitchen Lack of Transportation (Non-Medical):   Physical Activity:   . Days of Exercise per Week:   . Minutes of Exercise per Session:   Stress:   . Feeling of Stress :   Social Connections:   . Frequency of Communication with Friends and Family:   . Frequency of Social Gatherings with Friends and Family:   . Attends Religious Services:   . Active Member of Clubs or Organizations:   . Attends Archivist Meetings:   Marland Kitchen Marital Status:     Family History  Problem Relation Age of Onset  . Dementia Father   . Heart disease Father   . Clotting disorder Mother   . Arthritis Mother   . Clotting disorder Sister   . Clotting disorder Maternal Grandmother   . Clotting disorder Maternal Aunt   . Rheumatologic disease Neg Hx   . Hyperparathyroidism Neg Hx     Past Medical History:  Diagnosis Date  . Anxiety   . Asthma     . Depression   . Diabetes mellitus type 2 in obese ([facility]) 06/11/2016  . Embolism - blood clot January 1997 & January 2017   Also had LLE DVT in 1997  . Hypertension   . Preeclampsia 10/10/2011   1999   . Sleep apnea     Past Surgical History:  Procedure Laterality Date  .  KIDNEY STONE SURGERY       Current Outpatient Medications on File Prior to Visit  Medication Sig Dispense Refill  . amitriptyline (ELAVIL) 50 MG tablet TAKE SIX (6) TABLETS AT BEDTIME 180 tablet 0  . atorvastatin (LIPITOR) 10 MG tablet TAKE 1 TABLET ONCE DAILY. 30 tablet 0  . Blood Glucose Monitoring Suppl (ONETOUCH VERIO) w/Device KIT     . celecoxib (CELEBREX) 200 MG capsule TAKE (1) CAPSULE TWICE DAILY. 60 capsule 0  . clindamycin (CLINDAGEL) 1 % gel APPLY TOPICALLY TWICE A DAY. 30 g 0  . diclofenac sodium (VOLTAREN) 1 % GEL Apply 4 g topically 4 (four) times daily as needed. 100 g 3  . hydrOXYzine (ATARAX/VISTARIL) 25 MG tablet TAKE 1 TABLET EVERY 6 HOURS AS NEEDED FOR ANXIETY (Patient not taking: Reported on 05/08/2019) 60 tablet 2  . lithium carbonate (LITHOBID) 300 MG CR tablet TAKE 3 TABLETS AT BEDTIME. 90 tablet 0  . metFORMIN (GLUCOPHAGE-XR) 500 MG 24 hr tablet TAKE (2) TABLETS TWICE DAILY. 120 tablet 0  . methylphenidate 27 MG PO TB24 Take 1 tablet (27 mg total) by mouth daily. 30 tablet 0  . methylphenidate 36 MG PO CR tablet Take 1 tablet (36 mg total) by mouth daily. 30 tablet 0  . ONETOUCH DELICA LANCETS 50K MISC Ck Blood sugar up to 4 times a day as directed. ICD-10 E11.69, E66.9 400 each 0  . ONETOUCH VERIO test strip CHECK BLOOD SUGAR UP TO 4 TIMES A DAY AS DIRECTED. 100 each 3  . pantoprazole (PROTONIX) 40 MG tablet TAKE 1 TABLET ONCE DAILY. 30 tablet 0  . pregabalin (LYRICA) 75 MG capsule TAKE 3 CAPSULES TWICE A DAY. (Patient taking differently: 225 mg 2 (two) times daily. ) 180 capsule 0  . propranolol (INDERAL) 20 MG tablet TAKE 4 TABLETS TWICE DAILY. 240 tablet 0  . propranolol (INDERAL) 20 MG  tablet TAKE 4 TABLETS TWICE DAILY. 240 tablet 0  . Pyridoxine HCl (VITAMIN B-6) 500 MG tablet Take 1,000 mg by mouth daily.    . TRULICITY 1.5 XF/8.1WE SOPN INJECT 0.5ML ( 1.5MG) INTO THE SKIN ONCE WEEKLY. 2 mL 0  . vitamin B-12 (CYANOCOBALAMIN) 1000 MCG tablet Take 1,000 mcg by mouth daily.    . Vitamin D, Ergocalciferol, (DRISDOL) 1.25 MG (50000 UT) CAPS capsule TAKE 1 CAPSULE 3 TIMES WEEKLY. (Patient taking differently: Take 50,000 Units by mouth every 7 (seven) days. ) 12 capsule 0  . XARELTO 10 MG TABS tablet TAKE 1 TABLET ONCE DAILY WITH FOOD. 30 tablet 0   No current facility-administered medications on file prior to visit.    Allergies  Allergen Reactions  . Tramadol Other (See Comments)    Tingling all over     Physical exam:  Today's Vitals   06/02/19 0906  BP: 113/74  Pulse: 83  Temp: 97.6 F (36.4 C)  Weight: 259 lb (117.5 kg)  Height: 5' 8" (1.727 m)   Body mass index is 39.38 kg/m.   Wt Readings from Last 3 Encounters:  06/02/19 259 lb (117.5 kg)  03/04/18 260 lb 3.2 oz (118 kg)  02/27/18 263 lb 3.2 oz (119.4 kg)     Ht Readings from Last 3 Encounters:  06/02/19 5' 8" (1.727 m)  03/04/18 5' 8" (1.727 m)  02/27/18 5' 8" (1.727 m)      General: The patient is awake, alert and appears not in acute distress. The patient is well groomed. Head: Normocephalic, atraumatic. Neck is supple. Mallampati 4 - narrow !!!,  neck circumference:17.5  inches . Nasal airflow is patent.  She is very hoarse.  Retrognathia is  seen.   Cardiovascular:  Regular rate and cardiac rhythm by pulse,  without distended neck veins. Respiratory: Lungs are clear to auscultation.  She has been SOB.  Skin:  Without evidence of ankle edema, or rash. Trunk: The patient's posture is erect.   Neurologic exam : The patient is awake and alert, oriented to place and time.   Memory subjective described as intact.  Attention span & concentration ability appears normal.  Speech is fluent,   without  dysarthria, but dysphonia . Reports aphasia. Sometimes stuttered.  Mood and affect are appropriate.   Cranial nerves: no loss of smell or taste reported  Pupils are equal and briskly reactive to light. Funduscopic exam deferred. Extraocular movements in vertical and horizontal planes were intact and without nystagmus. No Diplopia. Visual fields by finger perimetry are intact. Hearing was intact to soft voice and finger rubbing.    Facial sensation intact to fine touch.  Facial motor strength is symmetric and tongue and uvula move midline.  Neck ROM : rotation, tilt and flexion extension were normal for age and shoulder shrug was symmetrical.    Motor exam:  Symmetric bulk, tone and ROM.   Normal tone without cog wheeling, symmetric grip strength . Sensory:  Fine touch, pinprick and vibration were tested  and  normal.  Proprioception tested in the upper extremities was normal.  Coordination: Rapid alternating movements in the fingers/hands were of normal speed.  The Finger-to-nose maneuver was intact without evidence of ataxia, dysmetria - only intention tremor.  Gait and station: Patient could rise unassisted from a seated position, walked without assistive device.  Stance is of normal width/ base and the patient turned with  steps.  Toe and heel walk were deferred.  Deep tendon reflexes: in the  upper and lower extremities are symmetric and intact.  Babinski response was deferred.     After spending a total time of 45 minutes face to face and additional time for physical and neurologic examination, review of laboratory studies,  personal review of imaging studies, reports and results of other testing and review of referral information / records as far as provided in visit, I have established the following assessments:  The patient forgot her CPAP, no data were available.  We finally got through her smart-phone app. Mrs. Holcombe was able to call at the history of CPAP use on  her smart phone, she has used the machine daily for the last 30 days, she had only 1 day of over 1 apnea per hour in the last 30 days, this may be related to an air leak.  I am speaking about Sunday, 6 March when her AHI was over 5 which was very unusual.  Her air seal however is not excellent she has air leakage over 15 L/min on 40% of her nights. She uses a so clean machine.    My Plan is to proceed with:   1)  Her Compliance is excellent, there are some air-leaks, these may be peak leaks. Has no snoring  residual on nasal pillows- I suggested a bella swift nasal pillow with both head gears, and ear loop.  2) No apnea treatment additionally to CPAP is needed.   3) I would try Modafinil- 100 mg added to her Concerta.       I would like to thank Dr. Clovis Pu, for allowing me to meet with and to take  care of this pleasant patient.   In short, Mykiah Schmuck is presenting with high degree fatigue and EDS- but her OSA is treated well, I can modify her headset and mask., a symptom that can be attributed to   I plan to follow up either personally or through our NP if needed.    CC: I will share my notes with PCP.   Electronically signed by: Larey Seat, MD 06/02/2019 9:08 AM  [facility] Board certified by The [employer] of Sleep Medicine and Diplomate of the [employer] of Sleep Medicine. Board certified In Neurology through the [place], Fellow of the [employer] of Neurology. Medical Director of [facility].

## 2019-06-02 NOTE — Telephone Encounter (Signed)
Called the pt back. She was questioning about the script. I advised the patient that I will fax it to the pharmacy once Dr Dohmeier signs it. Advised that most likely it will require PA. She verbalized understanding and was appreciative.

## 2019-06-02 NOTE — Telephone Encounter (Signed)
Pt presented with questions following appointment today regarding a new prescription. Please advise when possible.

## 2019-06-02 NOTE — Patient Instructions (Signed)

## 2019-06-04 ENCOUNTER — Other Ambulatory Visit: Payer: Self-pay

## 2019-06-04 ENCOUNTER — Other Ambulatory Visit: Payer: Self-pay | Admitting: Neurology

## 2019-06-04 ENCOUNTER — Ambulatory Visit (INDEPENDENT_AMBULATORY_CARE_PROVIDER_SITE_OTHER): Payer: BC Managed Care – PPO | Admitting: Psychiatry

## 2019-06-04 ENCOUNTER — Telehealth: Payer: Self-pay

## 2019-06-04 ENCOUNTER — Encounter: Payer: Self-pay | Admitting: Psychiatry

## 2019-06-04 DIAGNOSIS — G4733 Obstructive sleep apnea (adult) (pediatric): Secondary | ICD-10-CM

## 2019-06-04 DIAGNOSIS — F411 Generalized anxiety disorder: Secondary | ICD-10-CM

## 2019-06-04 DIAGNOSIS — R7989 Other specified abnormal findings of blood chemistry: Secondary | ICD-10-CM

## 2019-06-04 DIAGNOSIS — F332 Major depressive disorder, recurrent severe without psychotic features: Secondary | ICD-10-CM | POA: Diagnosis not present

## 2019-06-04 DIAGNOSIS — G251 Drug-induced tremor: Secondary | ICD-10-CM

## 2019-06-04 DIAGNOSIS — G4710 Hypersomnia, unspecified: Secondary | ICD-10-CM

## 2019-06-04 MED ORDER — METHYLPHENIDATE HCL ER (OSM) 36 MG PO TBCR: 36.0000 mg | EXTENDED_RELEASE_TABLET | Freq: Every day | ORAL | 0 refills | Status: DC

## 2019-06-04 MED ORDER — VITAMIN D (ERGOCALCIFEROL) 1.25 MG (50000 UNIT) PO CAPS: ORAL_CAPSULE | ORAL | 5 refills | Status: DC

## 2019-06-04 MED ORDER — ARMODAFINIL 50 MG PO TABS: 100.0000 mg | ORAL_TABLET | ORAL | 5 refills | Status: DC

## 2019-06-04 MED ORDER — MODAFINIL 100 MG PO TABS: 100.0000 mg | ORAL_TABLET | Freq: Every day | ORAL | 1 refills | Status: DC

## 2019-06-04 NOTE — Telephone Encounter (Signed)
Prior authorization submitted and approved for MODAFINIL 100 MG #30 through [facility] ID# [id number] effective 06/04/2019-06/03/2020

## 2019-06-04 NOTE — Progress Notes (Signed)
Deborah Soto [id number] 10/25/1965 54 y.o.     Subjective:   Patient ID:  Deborah Soto is a 54 y.o. (DOB 1966/01/25) female.  Chief Complaint:  Chief Complaint  Patient presents with  . Follow-up    Medication Management  . Depression    Medication Management  . Anxiety  . ADD  . Sleeping Problem   Depression        Associated symptoms include fatigue and myalgias.  Associated symptoms include no decreased concentration.    Deborah Soto presents to the office today for follow-up of TRD and anxiety, and insomnia.  seen December 08, 2018.  She was complaining of lithium tremor and some cognitive problems.  Hydroxyzine was stopped.  Propranolol increased to 60 mg twice daily for tremor.  Lithium level and amitriptyline levels were requested. Amitriptyline level to 37, nortriptyline 49 on amitriptyline 250 mg nightly. Lithium level 0.800 mg daily  Phone call on October 6 to discuss lab results as follows: Note   ----- Message from [name]., MD sent at 12/15/2018 10:08 AM EDT ----- Lithium level stable at 0.8 in a good range.  The previous was 0.7.  She is having some tremor issues.  Normal BMP including excellent creatinine and calcium levels.  Blood sugar is high but she is aware of that problem.  TSH is within normal limits.  Serum amitriptyline level is 286 which is at the upper end of the normal range and suggestive that we not try further increase.  We will discuss further options at her follow-up appointment.  No medication adjustments required, but because of her tremor and her lithium level is slightly higher than it was with the prior level if she wants to reduce the lithium from 3 of the 300 mg tablets daily to 2-1/2 of the 300 mg tablets daily she can do so.  At her last appointment she was also encouraged to try a higher dose of propranolol and that may have resolved the problem.  If it did do not reduce the lithium but if it did not and she  wants to reduce the dose she can do so as noted.  She should call us if she has any recurrence of symptoms.  [name], [name]     She had repeat lithium level at 750 mg daily of 0.  7 with some improvement in tremor.  In phone call on November 20 she reported mood was worse with weepiness, overreacting, "pity party".  Because mood symptoms were worse with the reduction lithium she was encouraged to increase the dosage back to 900 mg daily.   Last seen February 04, 2019.  The following changes were made: Increase propranolol to 80 mg twice daily for lithium tremor and anxiety her blood pressure and pulse appear high enough that she can tolerate this dosage.  Disc Se.  Disc risk with low BP and DM.  He has not been having any problems with low blood sugar. Amitriptyline 6 or 300 mg daily if tolerated.  Try to take some about 4 -5 hours before sleep Try to take lithium also 4-5 hours before sleep to minimize daytime tremor Add methylphenidate ER 27 mg in the morning.  If no response we will increase the dosage.  Seen May 08, 2019 with her husband. Aside from physically feeling like she can't do much then having memory issues.  Forgetful.  Loses track of thoughts between tasks.  Most embarrassing being around people and word-finding issues.  Problems with walking bc balance and weakness.  Fears falling. Only fall at Xmas morning.  Tremor is awful.  Affects keying.   H notices cognitive problems and forgetfulness.    Shaking got really bad and got lithium level as low as 600 mg but the next day felt really helpless and everything seemed to be aimed at hurting me and felt disrespected.  Problems with boss and 54 yo taking 5 AP classes and 2 volleyball classes.  I've been on her to accomplish all these things.  She claimed that pt screaming at her.   H CO she was more irritable after the reduction in lithium.   H CO her anxiety also seems worse.  H says the last year has been unhealthy with  12 hour work days for her.   Doesn't go out much.    Attention problems more noticeable.  Still dropping and tremor.  Tolerated the increase in propranolol.    CO shakiness from lithium which interferes with typing. Also a good bit of anxiety generally without panic.  BS are high.  NotTaking hydroxyzine.   sleeping better and still using CPAP.  Using CPAP regularly. Less awakening than before.  No SE. depression was worse after reducing the lithium.  Irritability was also worse after reducing the lithium to 600 mg daily.  Last seen May 08, 2019.  Multiple changes made:  Reduce propranolol to 60 mg twice daily for lithium tremor and anxiety since the increase was not helpful.   Restart B6 for lithium tremor. 500 mg BID. She forgot and doesn't want more. Try to take lithium also 4-5 hours before sleep to minimize daytime tremor. depression was worse after reducing the lithium.  Irritability was also worse after reducing the lithium to 600 mg daily. So reluctant to reduce it further.  It is unclear how to address the benefits of lithium without using lithium other than to consider Spravato Increase methylphenidate ER 36 mg in the morning. Increase methylphenidate ER 36 mg in the morning. Wait 1-2 weeks then reduce amitriptyline to 5 tablets nightly to see if cognition is better. Also saw Dr. [name] re: sleep disorder.  Lost Rx of Concerta 36 so didn't take it long.  Out of it.  Still shakey and cognitive problems.  Dep 5/10/  Anxiety 6/10 worse at night.  No SI.  8 hours sleep.  Mild panic couple times daily. Frustrated with tremor and sleepiness which is worse than tiredness.   History of low vitamin D taking 150K/week but stopped 6 mos ago.   Past Psychiatric Medication Trials: Failed ECT and TMS, amitriptyline helped for 2 to 3 years in 1987,  Trintellix, Emsam, duloxetine 120, sertraline, lithium SE, Latuda 120, lamotrigine 300,  Lexapro 20, Fetzima, Abilify 15, Seroquel 400,  risperidone, Geodon, Vraylar, Rexulti, buspirone, modafinil, pramipexole,  Nuvigil,  buspirone, gabapentin,  Haldol for agitation, clonazepam,  Vyvanse, trazodone, Wellbutrin, fluoxetine, paroxetine, sertraline, propranolol Nortriptyline 200 with a therapeutic blood level. history of overdose on Xanax, There is a history of suicide attempts and psychiatric hospitalizations.  Review of Systems:  Review of Systems  Constitutional: Positive for fatigue.  Musculoskeletal: Positive for back pain and myalgias. Negative for gait problem.  Neurological: Positive for tremors and weakness. Negative for dizziness.  Psychiatric/Behavioral: Positive for depression. Negative for confusion, decreased concentration, hallucinations and self-injury. The patient is nervous/anxious.     Medications: I have reviewed the patient's current medications.  Current Outpatient Medications  Medication Sig Dispense Refill  . amitriptyline (ELAVIL)  50 MG tablet TAKE SIX (6) TABLETS AT BEDTIME (Patient taking differently: Take 250 mg by mouth at bedtime. ) 180 tablet 0  . atorvastatin (LIPITOR) 10 MG tablet TAKE 1 TABLET ONCE DAILY. 30 tablet 0  . celecoxib (CELEBREX) 200 MG capsule TAKE (1) CAPSULE TWICE DAILY. 60 capsule 0  . lithium carbonate (LITHOBID) 300 MG CR tablet TAKE 3 TABLETS AT BEDTIME. 90 tablet 0  . metFORMIN (GLUCOPHAGE-XR) 500 MG 24 hr tablet TAKE (2) TABLETS TWICE DAILY. (Patient taking differently: Take 1,000 mg by mouth in the morning and at bedtime. ) 120 tablet 0  . pantoprazole (PROTONIX) 40 MG tablet TAKE 1 TABLET ONCE DAILY. 30 tablet 0  . pregabalin (LYRICA) 75 MG capsule TAKE 3 CAPSULES TWICE A DAY. (Patient taking differently: 225 mg 2 (two) times daily. ) 180 capsule 0  . propranolol (INDERAL) 20 MG tablet TAKE 4 TABLETS TWICE DAILY. 240 tablet 0  . XARELTO 10 MG TABS tablet TAKE 1 TABLET ONCE DAILY WITH FOOD. 30 tablet 0  . methylphenidate 36 MG PO CR tablet Take 1 tablet (36 mg total) by  mouth daily. 30 tablet 0  . modafinil (PROVIGIL) 100 MG tablet Take 1 tablet (100 mg total) by mouth daily. (Patient not taking: Reported on 06/04/2019) 30 tablet 0  . modafinil (PROVIGIL) 100 MG tablet Take 1 tablet (100 mg total) by mouth daily. 30 tablet 1  . Vitamin D, Ergocalciferol, (DRISDOL) 1.25 MG (50000 UNIT) CAPS capsule 2 weekly 10 capsule 5   No current facility-administered medications for this visit.    Medication Side Effects: Sedation  Allergies:  Allergies  Allergen Reactions  . Tramadol Other (See Comments)    Tingling all over     Past Medical History:  Diagnosis Date  . Anxiety   . Asthma   . Depression   . Diabetes mellitus type 2 in obese (HCC) 06/11/2016  . Embolism - blood clot January 1997 & January 2017   Also had LLE DVT in 1997  . Hypertension   . Preeclampsia 10/10/2011   1999   . Sleep apnea     Family History  Problem Relation Age of Onset  . Dementia Father   . Heart disease Father   . Clotting disorder Mother   . Arthritis Mother   . Clotting disorder Sister   . Clotting disorder Maternal Grandmother   . Clotting disorder Maternal Aunt   . Rheumatologic disease Neg Hx   . Hyperparathyroidism Neg Hx     Social History   Socioeconomic History  . Marital status: Married    Spouse name: Not on file  . Number of children: Y  . Years of education: Not on file  . Highest education level: Not on file  Occupational History  . Occupation: admin assist    Comment: Engineer, manufacturing systems  Tobacco Use  . Smoking status: Former Smoker    Packs/day: 1.00    Years: 20.00    Pack years: 20.00    Quit date: 03/12/2004    Years since quitting: 15.2  . Smokeless tobacco: Never Used  Substance and Sexual Activity  . Alcohol use: Yes    Alcohol/week: 1.0 standard drinks    Types: 1 Cans of beer per week    Comment: 1-2 times a week  . Drug use: No  . Sexual activity: Yes    Partners: Male    Birth control/protection: None  Other Topics  Concern  . Not on file  Social History Narrative  Originally from [facility]. Always lived in [place]. 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 No international travel recently. Previously has been to [place] in May 1996. No mold exposure recently. No bird exposure. Does have multiple pets.    Social Determinants of Health   Financial Resource Strain:   . Difficulty of Paying Living Expenses:   Food Insecurity:   . Worried About Programme researcher, broadcasting/film/video in the Last Year:   . Barista in the Last Year:   Transportation Needs:   . Freight forwarder (Medical):   Marland Kitchen Lack of Transportation (Non-Medical):   Physical Activity:   . Days of Exercise per Week:   . Minutes of Exercise per Session:   Stress:   . Feeling of Stress :   Social Connections:   . Frequency of Communication with Friends and Family:   . Frequency of Social Gatherings with Friends and Family:   . Attends Religious Services:   . Active Member of Clubs or Organizations:   . Attends Banker Meetings:   Marland Kitchen Marital Status:   Intimate Partner Violence:   . Fear of Current or Ex-Partner:   . Emotionally Abused:   Marland Kitchen Physically Abused:   . Sexually Abused:     Past Medical History, Surgical history, Social history, and Family history were reviewed and updated as appropriate.   Please see review of systems for further details on the patient's review from today.   Objective:   Physical Exam:  LMP  (LMP Unknown) Comment: last period ---  1 year ago (?)  Physical Exam Constitutional:      General: She is not in acute distress.    Appearance: She is well-developed. She is obese.  Musculoskeletal:        General: No deformity.  Neurological:     Mental Status: She is alert and oriented to person, place, and time.     Cranial Nerves: No dysarthria.     Coordination: Coordination normal.  Psychiatric:        Attention and Perception: Attention and perception normal. She does not perceive auditory or  visual hallucinations.        Mood and Affect: Mood is anxious and depressed. Affect is not labile, blunt, angry or inappropriate.        Speech: Speech normal.        Behavior: Behavior normal. Behavior is cooperative.        Thought Content: Thought content normal. Thought content is not paranoid or delusional. Thought content does not include homicidal or suicidal ideation. Thought content does not include homicidal or suicidal plan.        Cognition and Memory: Cognition and memory normal.        Judgment: Judgment normal.     Comments: Insight fair. She has  improvement in depression on amitriptyline and lithium.  Suicidal thoughts resolved on lithium.  She denies being agitated now but did have it at lithium 600 mg daily     Lab Review:     Component Value Date/Time   NA 140 12/11/2018 1121   NA 140 12/11/2016 1236   K 4.1 12/11/2018 1121   K 4.2 12/11/2016 1236   CL 106 12/11/2018 1121   CO2 24 12/11/2018 1121   CO2 24 12/11/2016 1236   GLUCOSE 159 (H) 12/11/2018 1121   GLUCOSE 109 12/11/2016 1236   BUN 10 12/11/2018 1121   BUN 15.1 12/11/2016 1236   CREATININE 0.61 12/11/2018 1121  CREATININE 0.8 12/11/2016 1236   CALCIUM 9.5 12/11/2018 1121   CALCIUM 10.0 12/11/2016 1236   PROT 6.7 06/05/2017 1040   PROT 7.5 12/11/2016 1236   ALBUMIN 4.4 03/21/2017 1210   ALBUMIN 4.4 12/11/2016 1236   AST 28 06/05/2017 1040   AST 38 (H) 12/11/2016 1236   ALT 39 (H) 06/05/2017 1040   ALT 61 (H) 12/11/2016 1236   ALKPHOS 70 03/21/2017 1210   ALKPHOS 71 12/11/2016 1236   BILITOT 0.4 06/05/2017 1040   BILITOT 0.62 12/11/2016 1236   GFRNONAA 105 12/11/2018 1121   GFRAA 122 12/11/2018 1121       Component Value Date/Time   WBC 9.7 10/10/2017 1014   RBC 4.65 10/10/2017 1014   HGB 14.2 10/10/2017 1014   HGB 14.5 12/11/2016 1236   HCT 42.5 10/10/2017 1014   HCT 43.9 12/11/2016 1236   PLT 304.0 10/10/2017 1014   PLT 289 12/11/2016 1236   PLT 295 07/24/2016 1848   MCV 91.4  10/10/2017 1014   MCV 93.2 12/11/2016 1236   MCH 29.7 06/05/2017 1040   MCHC 33.3 10/10/2017 1014   RDW 13.9 10/10/2017 1014   RDW 13.5 12/11/2016 1236   LYMPHSABS 2,330 06/05/2017 1040   LYMPHSABS 2.4 12/11/2016 1236   MONOABS 0.7 12/11/2016 1236   EOSABS 692 (H) 06/05/2017 1040   EOSABS 0.7 (H) 12/11/2016 1236   BASOSABS 73 06/05/2017 1040   BASOSABS 0.0 12/11/2016 1236    Lithium Lvl  Date Value Ref Range Status  12/11/2018 0.8 0.6 - 1.2 mmol/L Final    Serum nortriptyline level on 150 mg a day was 109  No results found for: PHENYTOIN, PHENOBARB, VALPROATE, CBMZ   .res Assessment: Plan:    Severe recurrent major depression without psychotic features (HCC) - Plan: methylphenidate 36 MG PO CR tablet, Lithium level  Generalized anxiety disorder  Hypersomnolence - Plan: modafinil (PROVIGIL) 100 MG tablet  Low vitamin D level - Plan: Vitamin D, Ergocalciferol, (DRISDOL) 1.25 MG (50000 UNIT) CAPS capsule, Vitamin D 1,25 dihydroxy  Obstructive sleep apnea - Plan: methylphenidate 36 MG PO CR tablet, modafinil (PROVIGIL) 100 MG tablet  Lithium-induced tremor   Marcelino DusterMichelle has chronic severe major depression and generalized anxiety that is treatment resistant and failed multiple medications ECT and TMS as noted above including SSRIs, try cyclic's, lithium, and atypical antipsychotics for depression.. She has had partial benefit from amitriptyline plus lithium.  However she is having tremor problems from the lithium.  Efforts to reduce the lithium have led to worsening psychiatric symptoms.  There has been a discussion about possible bipolar elements but she has no clear manic episodes unrelated to medication changes.  She has features of atypical depression.   Discussed Spravato option in detail.  We will discuss it again at follow-up appointment.  She was given information to read in the interim and the treatment plan was discussed in terms of frequency and side effects.  Therefore  consideration could be given to other trials of MAO inhibitors as they are classically more effective than other antidepressants and atypical depression.  She did fail a trial of Emsam.  Consideration could be given to using tablet version of selegiline and going to much higher doses or the use of Parnate or Nardil..  Overall depression is improved with amitriptyline in combination with the lithium.  She still has significant anxiety.  Irritability was worse at lower doses of lithium than 900 mg daily.  She is also having problems with tremor.  She is  also having some cognitive problems.  Serum amitriptyline levels noted and discussed in detail at the last appointment.  They are in the high normal range but not near the toxic range of 500.   She would like to reduce the lithium but this appears risky with a combination of residual depression and the potential for worsening irritability with a history of suicidal thoughts.  I would not advise reducing the lithium at this time.  We will check lithium level  Disc Dr. Juluis Mire recommendation to add Modafinil 100 to Concerta 36.    Encourage physical activity and weight loss.  She is not particularly motivated and feels that is hard to accomplish because of chronic pain.  Chronic pain complicates her treatment of depression.  Restart B6 for lithium tremor. 500 mg BID.  Try to take lithium also 4-5 hours before sleep to minimize daytime tremor. depression was worse after reducing the lithium.  Irritability was also worse after reducing the lithium to 600 mg daily.  Retry Increase methylphenidate ER 36 mg in the morning.  If no response we will increase the dosage.  Disc risk anxiety and call if needed.     History of low vitamin D taking 150K/week but stopped 6 mos ago.  Restart D 100K units weekly.  Plan: Modafinil 100 mg tablet 1 dailly for 1 week, Then add Concerta 36 mg 1 each AM.  Option increase modafinil mid April   Restart B6 for lithium  tremor. 500 mg BID.  Get lab test at earliest convience   Follow-up 4 weeks  [name], [name]  Future Appointments  Date Time Provider Department Center  07/06/2019  9:30 AM Cottle, Steva Ready., MD [facility] None    Orders Placed This Encounter  Procedures  . Lithium level  . Vitamin D 1,25 dihydroxy      -------------------------------

## 2019-06-04 NOTE — Patient Instructions (Addendum)
Modafinil 100 mg tablet 1 dailly for 1 week, Then add Concerta 36 mg 1 each AM.  Option increase modafinil mid April   Restart B6 for lithium tremor. 500 mg BID.  Get lab test at earliest convience

## 2019-06-10 ENCOUNTER — Other Ambulatory Visit: Payer: Self-pay | Admitting: Psychiatry

## 2019-06-10 DIAGNOSIS — F411 Generalized anxiety disorder: Secondary | ICD-10-CM

## 2019-06-10 DIAGNOSIS — F331 Major depressive disorder, recurrent, moderate: Secondary | ICD-10-CM

## 2019-06-16 ENCOUNTER — Telehealth: Payer: Self-pay | Admitting: Psychiatry

## 2019-06-16 NOTE — Telephone Encounter (Signed)
Tried to reach patient but her mail box is full, unable to leave message

## 2019-06-16 NOTE — Telephone Encounter (Signed)
Tried to reach patient again, mail box full unable to leave message

## 2019-06-16 NOTE — Telephone Encounter (Signed)
Tried to reach patient again, still unable to leave a message

## 2019-06-16 NOTE — Telephone Encounter (Signed)
Pt left message still having issues with meds. Contact @ [phone]

## 2019-06-17 ENCOUNTER — Other Ambulatory Visit: Payer: Self-pay | Admitting: Psychiatry

## 2019-06-17 NOTE — Telephone Encounter (Signed)
Pt stated the Neurologist added modafinil and Dr.Cottle wanted to make sure no side effects.Then start back on methylphenidate at next appt. . She has meds to start back. Please advise.

## 2019-06-17 NOTE — Telephone Encounter (Signed)
Pt would like to start methlphenidate back. Can't keep her eyes open or stay awake.Next appt 4/26. Don't want to wait that long before starting again. Also, noticed over past few months more weakness in her legs. Have been walking more. Walk like she's drunk.

## 2019-06-17 NOTE — Telephone Encounter (Signed)
I do not understand why she is not taking methylphenidate now.  At her last appointment we decided to increase her methylphenidate ER from 27 mg to 36 mg daily.  A prescription has been sent in.

## 2019-06-18 NOTE — Telephone Encounter (Signed)
Given instructions since no side effects from Modafinil she is to add the Concerta back at 36 mg per last visit. She agreed.

## 2019-06-29 ENCOUNTER — Telehealth: Payer: Self-pay | Admitting: Neurology

## 2019-06-29 NOTE — Telephone Encounter (Signed)
Called the pharmacy back. They were wanting to change the script to 100 mg each morning and quantity of 30 tab with 5 refills. Change was made and verbal order given.

## 2019-06-29 NOTE — Telephone Encounter (Signed)
Irving Burton @  [facility] is asking for a call from RN re: pt's Armodafinil

## 2019-06-29 NOTE — Addendum Note (Signed)
Addended by: Judi Cong on: 06/29/2019 10:47 AM   Modules accepted: Orders

## 2019-07-06 ENCOUNTER — Encounter: Payer: BC Managed Care – PPO | Admitting: Psychiatry

## 2019-07-06 NOTE — Progress Notes (Signed)
This encounter was created in error - please disregard.

## 2019-07-08 ENCOUNTER — Other Ambulatory Visit: Payer: Self-pay | Admitting: Psychiatry

## 2019-07-08 DIAGNOSIS — F411 Generalized anxiety disorder: Secondary | ICD-10-CM

## 2019-07-08 DIAGNOSIS — F331 Major depressive disorder, recurrent, moderate: Secondary | ICD-10-CM

## 2019-07-24 ENCOUNTER — Other Ambulatory Visit: Payer: Self-pay | Admitting: Neurology

## 2019-07-24 ENCOUNTER — Other Ambulatory Visit: Payer: Self-pay | Admitting: Psychiatry

## 2019-07-24 DIAGNOSIS — F332 Major depressive disorder, recurrent severe without psychotic features: Secondary | ICD-10-CM

## 2019-07-24 DIAGNOSIS — G4733 Obstructive sleep apnea (adult) (pediatric): Secondary | ICD-10-CM

## 2019-07-24 NOTE — Telephone Encounter (Signed)
Last apt 03/25

## 2019-08-11 ENCOUNTER — Other Ambulatory Visit: Payer: Self-pay | Admitting: Psychiatry

## 2019-08-11 DIAGNOSIS — F411 Generalized anxiety disorder: Secondary | ICD-10-CM

## 2019-08-11 DIAGNOSIS — F331 Major depressive disorder, recurrent, moderate: Secondary | ICD-10-CM

## 2019-08-12 NOTE — Telephone Encounter (Signed)
Last apt 03/25 was due back 4 weeks

## 2019-08-19 DIAGNOSIS — E1165 Type 2 diabetes mellitus with hyperglycemia: Secondary | ICD-10-CM | POA: Diagnosis not present

## 2019-08-19 DIAGNOSIS — R251 Tremor, unspecified: Secondary | ICD-10-CM | POA: Diagnosis not present

## 2019-08-19 DIAGNOSIS — R1084 Generalized abdominal pain: Secondary | ICD-10-CM | POA: Diagnosis not present

## 2019-08-19 DIAGNOSIS — R270 Ataxia, unspecified: Secondary | ICD-10-CM | POA: Diagnosis not present

## 2019-08-19 LAB — BASIC METABOLIC PANEL
BUN: 12 (ref 4–21)
Chloride: 105 (ref 99–108)
Creatinine: 0.6 (ref 0.5–1.1)
Glucose: 275
Potassium: 4.9 (ref 3.4–5.3)

## 2019-08-19 LAB — HEPATIC FUNCTION PANEL
ALT: 36 — AB (ref 7–35)
AST: 38 — AB (ref 13–35)
Alkaline Phosphatase: 74 (ref 25–125)
Bilirubin, Total: 0.5

## 2019-08-19 LAB — CBC: RBC: 4.34 (ref 3.87–5.11)

## 2019-08-19 LAB — CBC AND DIFFERENTIAL
HCT: 40 (ref 36–46)
Hemoglobin: 13.1 (ref 12.0–16.0)
Neutrophils Absolute: 6
WBC: 8

## 2019-08-19 LAB — COMPREHENSIVE METABOLIC PANEL
Albumin: 4.5 (ref 3.5–5.0)
Calcium: 9.4 (ref 8.7–10.7)

## 2019-08-19 LAB — HEMOGLOBIN A1C: Hemoglobin A1C: 8.2

## 2019-08-21 DIAGNOSIS — Z0000 Encounter for general adult medical examination without abnormal findings: Secondary | ICD-10-CM | POA: Diagnosis not present

## 2019-08-21 DIAGNOSIS — E782 Mixed hyperlipidemia: Secondary | ICD-10-CM | POA: Diagnosis not present

## 2019-08-21 DIAGNOSIS — K760 Fatty (change of) liver, not elsewhere classified: Secondary | ICD-10-CM | POA: Diagnosis not present

## 2019-08-21 DIAGNOSIS — R251 Tremor, unspecified: Secondary | ICD-10-CM | POA: Diagnosis not present

## 2019-08-21 DIAGNOSIS — R197 Diarrhea, unspecified: Secondary | ICD-10-CM | POA: Diagnosis not present

## 2019-08-21 DIAGNOSIS — R270 Ataxia, unspecified: Secondary | ICD-10-CM | POA: Diagnosis not present

## 2019-08-21 DIAGNOSIS — Z79899 Other long term (current) drug therapy: Secondary | ICD-10-CM | POA: Diagnosis not present

## 2019-08-21 DIAGNOSIS — Z124 Encounter for screening for malignant neoplasm of cervix: Secondary | ICD-10-CM | POA: Diagnosis not present

## 2019-08-21 DIAGNOSIS — Z1231 Encounter for screening mammogram for malignant neoplasm of breast: Secondary | ICD-10-CM | POA: Diagnosis not present

## 2019-08-21 LAB — HEPATIC FUNCTION PANEL
ALT: 36 — AB (ref 7–35)
AST: 38 — AB (ref 13–35)
Alkaline Phosphatase: 74 (ref 25–125)
Bilirubin, Total: 0.5

## 2019-08-21 LAB — BASIC METABOLIC PANEL
BUN: 12 (ref 4–21)
CO2: 24 — AB (ref 13–22)
Chloride: 105 (ref 99–108)
Creatinine: 0.6 (ref 0.5–1.1)
Glucose: 275
Potassium: 4.9 (ref 3.4–5.3)
Sodium: 138 (ref 137–147)

## 2019-08-21 LAB — LIPID PANEL
Cholesterol: 121 (ref 0–200)
HDL: 42 (ref 35–70)
LDL Cholesterol: 63
Triglycerides: 185 — AB (ref 40–160)

## 2019-08-21 LAB — TSH: TSH: 1.29 (ref 0.41–5.90)

## 2019-08-21 LAB — COMPREHENSIVE METABOLIC PANEL: Calcium: 9.4 (ref 8.7–10.7)

## 2019-08-21 LAB — RESULTS CONSOLE HPV: CHL HPV: NEGATIVE

## 2019-08-25 ENCOUNTER — Telehealth: Payer: Self-pay | Admitting: Psychiatry

## 2019-08-25 ENCOUNTER — Other Ambulatory Visit: Payer: Self-pay

## 2019-08-25 DIAGNOSIS — F332 Major depressive disorder, recurrent severe without psychotic features: Secondary | ICD-10-CM

## 2019-08-25 DIAGNOSIS — G4733 Obstructive sleep apnea (adult) (pediatric): Secondary | ICD-10-CM

## 2019-08-25 MED ORDER — METHYLPHENIDATE HCL ER (OSM) 36 MG PO TBCR: 36.0000 mg | EXTENDED_RELEASE_TABLET | Freq: Every day | ORAL | 0 refills | Status: DC

## 2019-08-25 NOTE — Telephone Encounter (Signed)
Last refill 07/24/2019 Pended for Dr. Jennelle Human to send

## 2019-08-25 NOTE — Telephone Encounter (Signed)
Patient called and said that she needs a refill on her concerta er 36 mg to be sent to [facility]

## 2019-09-23 ENCOUNTER — Other Ambulatory Visit: Payer: Self-pay | Admitting: Psychiatry

## 2019-09-23 DIAGNOSIS — F332 Major depressive disorder, recurrent severe without psychotic features: Secondary | ICD-10-CM

## 2019-09-23 DIAGNOSIS — G4733 Obstructive sleep apnea (adult) (pediatric): Secondary | ICD-10-CM

## 2019-09-23 NOTE — Telephone Encounter (Signed)
Please send for Dr.Cottle, next apt 08/04 Last refill 08/25/2019

## 2019-10-13 DIAGNOSIS — M9902 Segmental and somatic dysfunction of thoracic region: Secondary | ICD-10-CM | POA: Diagnosis not present

## 2019-10-13 DIAGNOSIS — M9901 Segmental and somatic dysfunction of cervical region: Secondary | ICD-10-CM | POA: Diagnosis not present

## 2019-10-13 DIAGNOSIS — M5412 Radiculopathy, cervical region: Secondary | ICD-10-CM | POA: Diagnosis not present

## 2019-10-13 DIAGNOSIS — M4722 Other spondylosis with radiculopathy, cervical region: Secondary | ICD-10-CM | POA: Diagnosis not present

## 2019-10-14 ENCOUNTER — Other Ambulatory Visit: Payer: Self-pay

## 2019-10-14 ENCOUNTER — Encounter: Payer: Self-pay | Admitting: Psychiatry

## 2019-10-14 ENCOUNTER — Ambulatory Visit (INDEPENDENT_AMBULATORY_CARE_PROVIDER_SITE_OTHER): Payer: BC Managed Care – PPO | Admitting: Psychiatry

## 2019-10-14 DIAGNOSIS — G4733 Obstructive sleep apnea (adult) (pediatric): Secondary | ICD-10-CM

## 2019-10-14 DIAGNOSIS — F411 Generalized anxiety disorder: Secondary | ICD-10-CM

## 2019-10-14 DIAGNOSIS — G4710 Hypersomnia, unspecified: Secondary | ICD-10-CM | POA: Diagnosis not present

## 2019-10-14 DIAGNOSIS — F332 Major depressive disorder, recurrent severe without psychotic features: Secondary | ICD-10-CM

## 2019-10-14 DIAGNOSIS — G251 Drug-induced tremor: Secondary | ICD-10-CM

## 2019-10-14 DIAGNOSIS — R7989 Other specified abnormal findings of blood chemistry: Secondary | ICD-10-CM | POA: Diagnosis not present

## 2019-10-14 MED ORDER — METHYLPHENIDATE HCL ER (OSM) 54 MG PO TBCR: 54.0000 mg | EXTENDED_RELEASE_TABLET | Freq: Every day | ORAL | 0 refills | Status: DC

## 2019-10-14 NOTE — Progress Notes (Addendum)
Natallie Ravenscroft [id number] [birth year] 54 y.o.     Subjective:   Patient ID:  Deborah Soto is a 54 y.o. (DOB [birth year]) female.  Chief Complaint:  Chief Complaint  Patient presents with  . Follow-up  . Depression  . Fatigue   Depression        Associated symptoms include fatigue and myalgias.  Associated symptoms include no decreased concentration.    Jenie Parish presents to the office today for follow-up of TRD and anxiety, and insomnia.  seen December 08, 2018.  She was complaining of lithium tremor and some cognitive problems.  Hydroxyzine was stopped.  Propranolol increased to 60 mg twice daily for tremor.  Lithium level and amitriptyline levels were requested. Amitriptyline level to 37, nortriptyline 49 on amitriptyline 250 mg nightly. Lithium level 0.800 mg daily  Phone call on October 6 to discuss lab results as follows: Note   ----- Message from [name]., MD sent at 12/15/2018 10:08 AM EDT ----- Lithium level stable at 0.8 in a good range.  The previous was 0.7.  She is having some tremor issues.  Normal BMP including excellent creatinine and calcium levels.  Blood sugar is high but she is aware of that problem.  TSH is within normal limits.  Serum amitriptyline level is 286 which is at the upper end of the normal range and suggestive that we not try further increase.  We will discuss further options at her follow-up appointment.  No medication adjustments required, but because of her tremor and her lithium level is slightly higher than it was with the prior level if she wants to reduce the lithium from 3 of the 300 mg tablets daily to 2-1/2 of the 300 mg tablets daily she can do so.  At her last appointment she was also encouraged to try a higher dose of propranolol and that may have resolved the problem.  If it did do not reduce the lithium but if it did not and she wants to reduce the dose she can do so as noted.  She should call us if she has  any recurrence of symptoms.  [name], [name]     She had repeat lithium level at 750 mg daily of 0.  7 with some improvement in tremor.  In phone call on November 20 she reported mood was worse with weepiness, overreacting, "pity party".  Because mood symptoms were worse with the reduction lithium she was encouraged to increase the dosage back to 900 mg daily.   Last seen February 04, 2019.  The following changes were made: Increase propranolol to 80 mg twice daily for lithium tremor and anxiety her blood pressure and pulse appear high enough that she can tolerate this dosage.  Disc Se.  Disc risk with low BP and DM.  He has not been having any problems with low blood sugar. Amitriptyline 6 or 300 mg daily if tolerated.  Try to take some about 4 -5 hours before sleep Try to take lithium also 4-5 hours before sleep to minimize daytime tremor Add methylphenidate ER 27 mg in the morning.  If no response we will increase the dosage.  Seen May 08, 2019 with her husband. Aside from physically feeling like she can't do much then having memory issues.  Forgetful.  Loses track of thoughts between tasks.  Most embarrassing being around people and word-finding issues.   Problems with walking bc balance and weakness.  Fears falling. Only fall at Xmas morning.  Tremor  is awful.  Affects keying.   H notices cognitive problems and forgetfulness.    Shaking got really bad and got lithium level as low as 600 mg but the next day felt really helpless and everything seemed to be aimed at hurting me and felt disrespected.  Problems with boss and 54 yo taking 5 AP classes and 2 volleyball classes.  I've been on her to accomplish all these things.  She claimed that pt screaming at her.   H CO she was more irritable after the reduction in lithium.   H CO her anxiety also seems worse.  H says the last year has been unhealthy with 12 hour work days for her.   Doesn't go out much.    Attention problems more  noticeable.  Still dropping and tremor.  Tolerated the increase in propranolol.    CO shakiness from lithium which interferes with typing. Also a good bit of anxiety generally without panic.  BS are high.  NotTaking hydroxyzine.   sleeping better and still using CPAP.  Using CPAP regularly. Less awakening than before.  No SE. depression was worse after reducing the lithium.  Irritability was also worse after reducing the lithium to 600 mg daily.  Last seen May 08, 2019.  Multiple changes made:  Reduce propranolol to 60 mg twice daily for lithium tremor and anxiety since the increase was not helpful.   Restart B6 for lithium tremor. 500 mg BID. She forgot and doesn't want more. Try to take lithium also 4-5 hours before sleep to minimize daytime tremor. depression was worse after reducing the lithium.  Irritability was also worse after reducing the lithium to 600 mg daily. So reluctant to reduce it further.  It is unclear how to address the benefits of lithium without using lithium other than to consider Spravato Increase methylphenidate ER 36 mg in the morning. Increase methylphenidate ER 36 mg in the morning. Wait 1-2 weeks then reduce amitriptyline to 5 tablets nightly to see if cognition is better. Also saw [name] re: sleep disorder.  05/2019 appt with the following noted: Lost Rx of Concerta 36 so didn't take it long.  Out of it.  Still shakey and cognitive problems.  Dep 5/10/  Anxiety 6/10 worse at night.  No SI.  8 hours sleep.  Mild panic couple times daily. Frustrated with tremor and sleepiness which is worse than tiredness.   History of low vitamin D taking 150K/week but stopped 6 mos ago. Plan : Modafinil 100 mg tablet 1 dailly for 1 week, Then add Concerta 36 mg 1 each AM.  Option increase modafinil mid April  Restart B6 for lithium tremor. 500 mg BID. Get lab test at earliest convience  10/14/2019 appointment with the following noted: Has been on amitriptyline 250 mg  for mos.  Tried a week ago reducing to 200 and felt worse so back on 250. Weaned off Lyrica about a month ago.  Not much change off it.   Not sure if it helped energy or alertness. Saw Dr. Nonah Mattes in consultation. Mental clarity is a lot better with stimulants and not sleeping as much in daytime with current meds.  Severe drowsiness is a lot better.   Off metformin and added Glipizide.   Not great with depression 5/10.  Anxiety can trigger problems with thinking and somatic sx   I feel like it's something physical and can keep her up at night. Occ racing heart or feeling hot.  Caffeine variable.  Past Psychiatric  Medication Trials: Failed ECT and TMS,  amitriptyline helped for 2 to 3 years in 1987,  Trintellix, Emsam, duloxetine 120, Lexapro 20, Fetzima, amitriptyline 300,  Wellbutrin, fluoxetine, paroxetine, sertraline, Nortriptyline 200 with a therapeutic blood level. lithium SE, Latuda 120, Abilify 15, Seroquel 400, risperidone, Geodon, Vraylar, Rexulti, Haldol for agitation, lamotrigine 300,  buspirone, clonazepam, gabapentin, modafinil, pramipexole,  Nuvigil, Vyvanse, Trazodone, propranolol history of overdose on Xanax, There is a history of suicide attempts and psychiatric hospitalizations.  Review of Systems:  Review of Systems  Constitutional: Positive for fatigue.  Cardiovascular: Negative for palpitations.  Musculoskeletal: Positive for back pain and myalgias. Negative for gait problem.  Neurological: Positive for tremors and weakness. Negative for dizziness.  Psychiatric/Behavioral: Positive for depression. Negative for confusion, decreased concentration, hallucinations and self-injury. The patient is nervous/anxious.     Medications: I have reviewed the patient's current medications.  Current Outpatient Medications  Medication Sig Dispense Refill  . amitriptyline (ELAVIL) 50 MG tablet TAKE SIX (6) TABLETS AT BEDTIME (Patient taking differently: Take 250 mg by mouth at  bedtime. ) 180 tablet 0  . ARMODAFINIL PO Take 100 mg by mouth every morning.    Marland Kitchen. atorvastatin (LIPITOR) 10 MG tablet TAKE 1 TABLET ONCE DAILY. 30 tablet 0  . celecoxib (CELEBREX) 200 MG capsule TAKE (1) CAPSULE TWICE DAILY. 60 capsule 0  . lithium carbonate (LITHOBID) 300 MG CR tablet TAKE 3 TABLETS AT BEDTIME. 90 tablet 0  . metFORMIN (GLUCOPHAGE-XR) 500 MG 24 hr tablet TAKE (2) TABLETS TWICE DAILY. (Patient taking differently: Take 1,000 mg by mouth in the morning and at bedtime. ) 120 tablet 0  . METHYLPHENIDATE 36 MG PO CR tablet TAKE 1 TABLET ONCE DAILY. 30 tablet 0  . pantoprazole (PROTONIX) 40 MG tablet TAKE 1 TABLET ONCE DAILY. 30 tablet 0  . propranolol (INDERAL) 20 MG tablet TAKE 4 TABLETS TWICE DAILY. 240 tablet 0  . Vitamin D, Ergocalciferol, (DRISDOL) 1.25 MG (50000 UNIT) CAPS capsule 2 weekly 10 capsule 5  . XARELTO 10 MG TABS tablet TAKE 1 TABLET ONCE DAILY WITH FOOD. 30 tablet 0  . modafinil (PROVIGIL) 100 MG tablet Take 1 tablet (100 mg total) by mouth daily. (Patient not taking: Reported on 10/14/2019) 30 tablet 0  . pregabalin (LYRICA) 75 MG capsule TAKE 3 CAPSULES TWICE A DAY. (Patient not taking: Reported on 10/14/2019) 180 capsule 0   No current facility-administered medications for this visit.    Medication Side Effects: Sedation  Allergies:  Allergies  Allergen Reactions  . Tramadol Other (See Comments)    Tingling all over     Past Medical History:  Diagnosis Date  . Anxiety   . Asthma   . Depression   . Diabetes mellitus type 2 in obese (HCC) 06/11/2016  . Embolism - blood clot January 1997 & January 2017   Also had LLE DVT in 1997  . Hypertension   . Preeclampsia 10/10/2011   1999   . Sleep apnea     Family History  Problem Relation Age of Onset  . Dementia Father   . Heart disease Father   . Clotting disorder Mother   . Arthritis Mother   . Clotting disorder Sister   . Clotting disorder Maternal Grandmother   . Clotting disorder Maternal Aunt    . Rheumatologic disease Neg Hx   . Hyperparathyroidism Neg Hx     Social History   Socioeconomic History  . Marital status: Married    Spouse name: Not on file  .  Number of children: Y  . Years of education: Not on file  . Highest education level: Not on file  Occupational History  . Occupation: admin assist    Comment: Engineer, manufacturing systems  Tobacco Use  . Smoking status: Former Smoker    Packs/day: 1.00    Years: 20.00    Pack years: 20.00    Quit date: 03/12/2004    Years since quitting: 15.6  . Smokeless tobacco: Never Used  Substance and Sexual Activity  . Alcohol use: Yes    Alcohol/week: 1.0 standard drink    Types: 1 Cans of beer per week    Comment: 1-2 times a week  . Drug use: No  . Sexual activity: Yes    Partners: Male    Birth control/protection: None  Other Topics Concern  . Not on file  Social History Narrative   Originally from [place]. Always lived in [place]. 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 No international travel recently. Previously has been to [place] in May 1996. No mold exposure recently. No bird exposure. Does have multiple pets.    Social Determinants of Health   Financial Resource Strain:   . Difficulty of Paying Living Expenses:   Food Insecurity:   . Worried About Programme researcher, broadcasting/film/video in the Last Year:   . Barista in the Last Year:   Transportation Needs:   . Freight forwarder (Medical):   Marland Kitchen Lack of Transportation (Non-Medical):   Physical Activity:   . Days of Exercise per Week:   . Minutes of Exercise per Session:   Stress:   . Feeling of Stress :   Social Connections:   . Frequency of Communication with Friends and Family:   . Frequency of Social Gatherings with Friends and Family:   . Attends Religious Services:   . Active Member of Clubs or Organizations:   . Attends Banker Meetings:   Marland Kitchen Marital Status:   Intimate Partner Violence:   . Fear of Current or Ex-Partner:   . Emotionally Abused:    Marland Kitchen Physically Abused:   . Sexually Abused:     Past Medical History, Surgical history, Social history, and Family history were reviewed and updated as appropriate.   Please see review of systems for further details on the patient's review from today.   Objective:   Physical Exam:  LMP  (LMP Unknown) Comment: last period ---  1 year ago (?)  Physical Exam Constitutional:      General: She is not in acute distress.    Appearance: She is well-developed. She is obese.  Musculoskeletal:        General: No deformity.  Neurological:     Mental Status: She is alert and oriented to person, place, and time.     Cranial Nerves: No dysarthria.     Coordination: Coordination normal.  Psychiatric:        Attention and Perception: Attention and perception normal. She does not perceive auditory or visual hallucinations.        Mood and Affect: Mood is anxious and depressed. Affect is not labile, blunt, angry, tearful or inappropriate.        Speech: Speech normal.        Behavior: Behavior normal. Behavior is cooperative.        Thought Content: Thought content normal. Thought content is not paranoid or delusional. Thought content does not include homicidal or suicidal ideation. Thought content does not include homicidal or suicidal plan.  Cognition and Memory: Cognition and memory normal.        Judgment: Judgment normal.     Comments: Insight fair. She has  improvement in depression on amitriptyline and lithium.  Suicidal thoughts resolved on lithium.  She denies being agitated now but did have it at lithium 600 mg daily     Lab Review:     Component Value Date/Time   NA 140 12/11/2018 1121   NA 140 12/11/2016 1236   K 4.1 12/11/2018 1121   K 4.2 12/11/2016 1236   CL 106 12/11/2018 1121   CO2 24 12/11/2018 1121   CO2 24 12/11/2016 1236   GLUCOSE 159 (H) 12/11/2018 1121   GLUCOSE 109 12/11/2016 1236   BUN 10 12/11/2018 1121   BUN 15.1 12/11/2016 1236   CREATININE 0.61  12/11/2018 1121   CREATININE 0.8 12/11/2016 1236   CALCIUM 9.5 12/11/2018 1121   CALCIUM 10.0 12/11/2016 1236   PROT 6.7 06/05/2017 1040   PROT 7.5 12/11/2016 1236   ALBUMIN 4.4 03/21/2017 1210   ALBUMIN 4.4 12/11/2016 1236   AST 28 06/05/2017 1040   AST 38 (H) 12/11/2016 1236   ALT 39 (H) 06/05/2017 1040   ALT 61 (H) 12/11/2016 1236   ALKPHOS 70 03/21/2017 1210   ALKPHOS 71 12/11/2016 1236   BILITOT 0.4 06/05/2017 1040   BILITOT 0.62 12/11/2016 1236   GFRNONAA 105 12/11/2018 1121   GFRAA 122 12/11/2018 1121       Component Value Date/Time   WBC 9.7 10/10/2017 1014   RBC 4.65 10/10/2017 1014   HGB 14.2 10/10/2017 1014   HGB 14.5 12/11/2016 1236   HCT 42.5 10/10/2017 1014   HCT 43.9 12/11/2016 1236   PLT 304.0 10/10/2017 1014   PLT 289 12/11/2016 1236   PLT 295 07/24/2016 1848   MCV 91.4 10/10/2017 1014   MCV 93.2 12/11/2016 1236   MCH 29.7 06/05/2017 1040   MCHC 33.3 10/10/2017 1014   RDW 13.9 10/10/2017 1014   RDW 13.5 12/11/2016 1236   LYMPHSABS 2,330 06/05/2017 1040   LYMPHSABS 2.4 12/11/2016 1236   MONOABS 0.7 12/11/2016 1236   EOSABS 692 (H) 06/05/2017 1040   EOSABS 0.7 (H) 12/11/2016 1236   BASOSABS 73 06/05/2017 1040   BASOSABS 0.0 12/11/2016 1236    Lithium Lvl  Date Value Ref Range Status  12/11/2018 0.8 0.6 - 1.2 mmol/L Final    Serum nortriptyline level on 150 mg a day was 109  Amitriptyline  Level 12/11/2018 =237  On 250 mg daily,.  No results found for: PHENYTOIN, PHENOBARB, VALPROATE, CBMZ   .res Assessment: Plan:    Severe recurrent major depression without psychotic features (HCC)  Generalized anxiety disorder  Hypersomnolence  Low vitamin D level  Obstructive sleep apnea  Lithium-induced tremor   Karson has chronic severe major depression and generalized anxiety that is treatment resistant and failed multiple medications ECT and TMS as noted above including SSRIs, try cyclic's, lithium, and atypical antipsychotics for  depression.. She has had partial benefit from amitriptyline plus lithium.  However she is having tremor problems from the lithium.  Efforts to reduce the lithium have led to worsening psychiatric symptoms.  There has been a discussion about possible bipolar elements but she has no clear manic episodes unrelated to medication changes.  She has features of atypical depression.   Discussed Spravato option in detail.  We will discuss it again at follow-up appointment.  She was given information to read in the interim and the treatment  plan was discussed in terms of frequency and side effects.  Therefore consideration could be given to other trials of MAO inhibitors as they are classically more effective than other antidepressants and atypical depression.  She did fail a trial of Emsam.  Consideration could be given to using tablet version of selegiline and going to much higher doses or the use of Parnate or Nardil..  Overall depression is improved with amitriptyline in combination with the lithium.  She still has significant anxiety.  Irritability was worse at lower doses of lithium than 900 mg daily.  She is also having problems with tremor.  She is also having some cognitive problems.  Serum amitriptyline levels noted and discussed in detail at the last appointment.  They are in the high normal range but not near the toxic range of 500.    Rec increase amitriptyline as previously discussed to maximize benefit for depression, anxiety and pain to 300 mg nightly..  We will check lithium level  Disc Dr. Juluis Mire recommendation to add Modafinil 100 to Concerta .    Encourage physical activity and weight loss.  She is not particularly motivated and feels that is hard to accomplish because of chronic pain.  Chronic pain complicates her treatment of depression.  Restart B6 for lithium tremor. 500 mg BID. If needed.  Tremor is better.  Try to take lithium also 4-5 hours before sleep to minimize daytime  tremor. depression was worse after reducing the lithium.  Irritability was also worse after reducing the lithium to 600 mg daily.  Increase methylphenidate ER  To 54 mg in the morning.  She's gotten cog and alterntess benefit but not depression benefit.  Disc risk anxiety and call if needed.     History of low vitamin D taking 150K/week but stopped 6 mos ago.  Continue D 100K units weekly.  Plan: Increase methylphenidate from 36 to 54 mg in the morning.  Increase amitriptyline to 6 tablets in the evening   Follow-up 8 weeks  [name], [name]  No future appointments.  No orders of the defined types were placed in this encounter.     -------------------------------

## 2019-10-14 NOTE — Patient Instructions (Addendum)
Increase methylphenidate from 36 to 54 mg in the morning.  Increase amitriptyline to 6 tablets in the evening

## 2019-10-22 DIAGNOSIS — R0789 Other chest pain: Secondary | ICD-10-CM | POA: Diagnosis not present

## 2019-10-22 DIAGNOSIS — R079 Chest pain, unspecified: Secondary | ICD-10-CM | POA: Diagnosis not present

## 2019-10-22 DIAGNOSIS — Z20822 Contact with and (suspected) exposure to covid-19: Secondary | ICD-10-CM | POA: Diagnosis not present

## 2019-10-22 DIAGNOSIS — R432 Parageusia: Secondary | ICD-10-CM | POA: Diagnosis not present

## 2019-10-22 DIAGNOSIS — E1165 Type 2 diabetes mellitus with hyperglycemia: Secondary | ICD-10-CM | POA: Diagnosis not present

## 2019-10-23 DIAGNOSIS — M9901 Segmental and somatic dysfunction of cervical region: Secondary | ICD-10-CM | POA: Diagnosis not present

## 2019-10-23 DIAGNOSIS — M5412 Radiculopathy, cervical region: Secondary | ICD-10-CM | POA: Diagnosis not present

## 2019-10-23 DIAGNOSIS — M9902 Segmental and somatic dysfunction of thoracic region: Secondary | ICD-10-CM | POA: Diagnosis not present

## 2019-10-23 DIAGNOSIS — M4722 Other spondylosis with radiculopathy, cervical region: Secondary | ICD-10-CM | POA: Diagnosis not present

## 2019-10-25 ENCOUNTER — Other Ambulatory Visit: Payer: Self-pay | Admitting: Psychiatry

## 2019-10-25 DIAGNOSIS — F411 Generalized anxiety disorder: Secondary | ICD-10-CM

## 2019-10-25 DIAGNOSIS — F331 Major depressive disorder, recurrent, moderate: Secondary | ICD-10-CM

## 2019-10-28 DIAGNOSIS — M5412 Radiculopathy, cervical region: Secondary | ICD-10-CM | POA: Diagnosis not present

## 2019-10-28 DIAGNOSIS — M9902 Segmental and somatic dysfunction of thoracic region: Secondary | ICD-10-CM | POA: Diagnosis not present

## 2019-10-28 DIAGNOSIS — M4722 Other spondylosis with radiculopathy, cervical region: Secondary | ICD-10-CM | POA: Diagnosis not present

## 2019-10-28 DIAGNOSIS — M9901 Segmental and somatic dysfunction of cervical region: Secondary | ICD-10-CM | POA: Diagnosis not present

## 2019-11-02 ENCOUNTER — Other Ambulatory Visit: Payer: Self-pay | Admitting: Psychiatry

## 2019-11-03 DIAGNOSIS — M9902 Segmental and somatic dysfunction of thoracic region: Secondary | ICD-10-CM | POA: Diagnosis not present

## 2019-11-03 DIAGNOSIS — M9901 Segmental and somatic dysfunction of cervical region: Secondary | ICD-10-CM | POA: Diagnosis not present

## 2019-11-03 DIAGNOSIS — M5412 Radiculopathy, cervical region: Secondary | ICD-10-CM | POA: Diagnosis not present

## 2019-11-03 DIAGNOSIS — M4722 Other spondylosis with radiculopathy, cervical region: Secondary | ICD-10-CM | POA: Diagnosis not present

## 2019-11-04 DIAGNOSIS — L853 Xerosis cutis: Secondary | ICD-10-CM | POA: Diagnosis not present

## 2019-11-04 DIAGNOSIS — L700 Acne vulgaris: Secondary | ICD-10-CM | POA: Diagnosis not present

## 2019-11-04 DIAGNOSIS — L2489 Irritant contact dermatitis due to other agents: Secondary | ICD-10-CM | POA: Diagnosis not present

## 2019-11-09 ENCOUNTER — Telehealth: Payer: Self-pay | Admitting: Psychiatry

## 2019-11-09 ENCOUNTER — Other Ambulatory Visit: Payer: Self-pay

## 2019-11-09 DIAGNOSIS — G4733 Obstructive sleep apnea (adult) (pediatric): Secondary | ICD-10-CM

## 2019-11-09 DIAGNOSIS — F332 Major depressive disorder, recurrent severe without psychotic features: Secondary | ICD-10-CM

## 2019-11-09 MED ORDER — METHYLPHENIDATE HCL ER (OSM) 54 MG PO TBCR: 54.0000 mg | EXTENDED_RELEASE_TABLET | Freq: Every day | ORAL | 0 refills | Status: DC

## 2019-11-09 NOTE — Telephone Encounter (Signed)
Pt requesting refill for Methylphenidate 54 mg @ [facility]. Apt 10/7

## 2019-11-09 NOTE — Telephone Encounter (Signed)
Last refill 10/14/2019 Pended for Dr. Jennelle Human to submit Due back in October

## 2019-11-10 DIAGNOSIS — M5412 Radiculopathy, cervical region: Secondary | ICD-10-CM | POA: Diagnosis not present

## 2019-11-10 DIAGNOSIS — M9902 Segmental and somatic dysfunction of thoracic region: Secondary | ICD-10-CM | POA: Diagnosis not present

## 2019-11-10 DIAGNOSIS — M4722 Other spondylosis with radiculopathy, cervical region: Secondary | ICD-10-CM | POA: Diagnosis not present

## 2019-11-10 DIAGNOSIS — M9901 Segmental and somatic dysfunction of cervical region: Secondary | ICD-10-CM | POA: Diagnosis not present

## 2019-11-12 ENCOUNTER — Telehealth: Payer: Self-pay | Admitting: Psychiatry

## 2019-11-12 NOTE — Telephone Encounter (Signed)
I reviewed the meds and previous med lists.  The next options are either Spravato or a major med change from amitriptyline which is not something I can do outside of an appt.  She's on cancellation list and we'll try to work her in sooner.  Nothing I can change right now except since the increase in amitriptyline has not helped, she can reduce it to 200 mg nightly in preparation for other changes to come.

## 2019-11-12 NOTE — Telephone Encounter (Signed)
Pt called stated she's not feeling well at all. Ask for sooner apt than 10/7. Depression, anxiety & focus is much worse. Has not planned to hurt herself but thoughts of things would be better if I was not here have crossed her mind often. Contact ASAP @ [phone].  I put Pt on canc list

## 2019-11-12 NOTE — Telephone Encounter (Signed)
Last visit 10/14/19

## 2019-11-13 NOTE — Telephone Encounter (Signed)
Discussed symptoms and medications with patient, she did not increase her Amitriptyline 50 mg up to 6 tablets so she will start that today. Said you may have told her that but she didn't remember. She has read about Spravato but she is not wanting to proceed with that. Informed her she's on cancellation list as well. She will call back with worsening signs or symptoms.

## 2019-11-16 ENCOUNTER — Encounter: Payer: Self-pay | Admitting: Neurology

## 2019-11-17 DIAGNOSIS — M5412 Radiculopathy, cervical region: Secondary | ICD-10-CM | POA: Diagnosis not present

## 2019-11-17 DIAGNOSIS — M4722 Other spondylosis with radiculopathy, cervical region: Secondary | ICD-10-CM | POA: Diagnosis not present

## 2019-11-17 DIAGNOSIS — M9901 Segmental and somatic dysfunction of cervical region: Secondary | ICD-10-CM | POA: Diagnosis not present

## 2019-11-17 DIAGNOSIS — M9902 Segmental and somatic dysfunction of thoracic region: Secondary | ICD-10-CM | POA: Diagnosis not present

## 2019-11-23 ENCOUNTER — Telehealth (INDEPENDENT_AMBULATORY_CARE_PROVIDER_SITE_OTHER): Payer: BC Managed Care – PPO | Admitting: Psychiatry

## 2019-11-23 ENCOUNTER — Encounter: Payer: Self-pay | Admitting: Psychiatry

## 2019-11-23 DIAGNOSIS — F332 Major depressive disorder, recurrent severe without psychotic features: Secondary | ICD-10-CM | POA: Diagnosis not present

## 2019-11-23 DIAGNOSIS — G4710 Hypersomnia, unspecified: Secondary | ICD-10-CM

## 2019-11-23 DIAGNOSIS — F411 Generalized anxiety disorder: Secondary | ICD-10-CM | POA: Diagnosis not present

## 2019-11-23 DIAGNOSIS — G4733 Obstructive sleep apnea (adult) (pediatric): Secondary | ICD-10-CM | POA: Diagnosis not present

## 2019-11-23 MED ORDER — ARMODAFINIL 200 MG PO TABS: 200.0000 mg | ORAL_TABLET | ORAL | 1 refills | Status: DC

## 2019-11-23 MED ORDER — METHYLPHENIDATE HCL ER (OSM) 54 MG PO TBCR: 54.0000 mg | EXTENDED_RELEASE_TABLET | Freq: Every day | ORAL | 0 refills | Status: DC

## 2019-11-23 NOTE — Progress Notes (Signed)
Deborah Soto [id number] [birth year] 54 y.o.     Subjective:   Patient ID:  Deborah Soto is a 53 y.o. (DOB 23-Jul-1965) female.  Chief Complaint:  Chief Complaint  Patient presents with  . Follow-up    Medication Management  . Depression    Medication Management  . ADHD   Depression        Associated symptoms include fatigue and myalgias.  Associated symptoms include no decreased concentration.    Deborah Soto presents to the office today for follow-up of TRD and anxiety, and insomnia.  seen December 08, 2018.  She was complaining of lithium tremor and some cognitive problems.  Hydroxyzine was stopped.  Propranolol increased to 60 mg twice daily for tremor.  Lithium level and amitriptyline levels were requested. Amitriptyline level to 37, nortriptyline 49 on amitriptyline 250 mg nightly. Lithium level 0.800 mg daily  Phone call on October 6 to discuss lab results as follows: Note   ----- Message from [name]., MD sent at 12/15/2018 10:08 AM EDT ----- Lithium level stable at 0.8 in a good range.  The previous was 0.7.  She is having some tremor issues.  Normal BMP including excellent creatinine and calcium levels.  Blood sugar is high but she is aware of that problem.  TSH is within normal limits.  Serum amitriptyline level is 286 which is at the upper end of the normal range and suggestive that we not try further increase.  We will discuss further options at her follow-up appointment.  No medication adjustments required, but because of her tremor and her lithium level is slightly higher than it was with the prior level if she wants to reduce the lithium from 3 of the 300 mg tablets daily to 2-1/2 of the 300 mg tablets daily she can do so.  At her last appointment she was also encouraged to try a higher dose of propranolol and that may have resolved the problem.  If it did do not reduce the lithium but if it did not and she wants to reduce the dose she can  do so as noted.  She should call us if she has any recurrence of symptoms.  Deborah Staggers MD, [name]     She had repeat lithium level at 750 mg daily of 0.  7 with some improvement in tremor.  In phone call on November 20 she reported mood was worse with weepiness, overreacting, "pity party".  Because mood symptoms were worse with the reduction lithium she was encouraged to increase the dosage back to 900 mg daily.   Last seen February 04, 2019.  The following changes were made: Increase propranolol to 80 mg twice daily for lithium tremor and anxiety her blood pressure and pulse appear high enough that she can tolerate this dosage.  Disc Se.  Disc risk with low BP and DM.  He has not been having any problems with low blood sugar. Amitriptyline 6 or 300 mg daily if tolerated.  Try to take some about 4 -5 hours before sleep Try to take lithium also 4-5 hours before sleep to minimize daytime tremor Add methylphenidate ER 27 mg in the morning.  If no response we will increase the dosage.  Seen May 08, 2019 with her husband. Aside from physically feeling like she can't do much then having memory issues.  Forgetful.  Loses track of thoughts between tasks.  Most embarrassing being around people and word-finding issues.   Problems with walking bc balance and weakness.  Fears falling. Only fall at Xmas morning.  Tremor is awful.  Affects keying.   H notices cognitive problems and forgetfulness.    Shaking got really bad and got lithium level as low as 600 mg but the next day felt really helpless and everything seemed to be aimed at hurting me and felt disrespected.  Problems with boss and 54 yo taking 5 AP classes and 2 volleyball classes.  I've been on her to accomplish all these things.  She claimed that pt screaming at her.   H CO she was more irritable after the reduction in lithium.   H CO her anxiety also seems worse.  H says the last year has been unhealthy with 12 hour work days for her.    Doesn't go out much.    Attention problems more noticeable.  Still dropping and tremor.  Tolerated the increase in propranolol.    CO shakiness from lithium which interferes with typing. Also a good bit of anxiety generally without panic.  BS are high.  NotTaking hydroxyzine.   sleeping better and still using CPAP.  Using CPAP regularly. Less awakening than before.  No SE. depression was worse after reducing the lithium.  Irritability was also worse after reducing the lithium to 600 mg daily.  seen May 08, 2019.  Multiple changes made:  Reduce propranolol to 60 mg twice daily for lithium tremor and anxiety since the increase was not helpful.   Restart B6 for lithium tremor. 500 mg BID. She forgot and doesn't want more. Try to take lithium also 4-5 hours before sleep to minimize daytime tremor. depression was worse after reducing the lithium.  Irritability was also worse after reducing the lithium to 600 mg daily. So reluctant to reduce it further.  It is unclear how to address the benefits of lithium without using lithium other than to consider Spravato Increase methylphenidate ER 36 mg in the morning. Increase methylphenidate ER 36 mg in the morning. Wait 1-2 weeks then reduce amitriptyline to 5 tablets nightly to see if cognition is better. Also saw [name] re: sleep disorder.  05/2019 appt with the following noted: Lost Rx of Concerta 36 so didn't take it long.  Out of it.  Still shakey and cognitive problems.  Dep 5/10/  Anxiety 6/10 worse at night.  No SI.  8 hours sleep.  Mild panic couple times daily. Frustrated with tremor and sleepiness which is worse than tiredness.   History of low vitamin D taking 150K/week but stopped 6 mos ago. Plan : Modafinil 100 mg tablet 1 dailly for 1 week, Then add Concerta 36 mg 1 each AM.  Option increase modafinil mid April  Restart B6 for lithium tremor. 500 mg BID. Get lab test at earliest convience  10/14/2019 appointment with the  following noted: Has been on amitriptyline 250 mg for mos.  Tried a week ago reducing to 200 and felt worse so back on 250. Weaned off Lyrica about a month ago.  Not much change off it.   Not sure if it helped energy or alertness. Saw Dr. Nonah Mattes in consultation. Mental clarity is a lot better with stimulants and not sleeping as much in daytime with current meds.  Severe drowsiness is a lot better.   Off metformin and added Glipizide.   Not great with depression 5/10.  Anxiety can trigger problems with thinking and somatic sx   I feel like it's something physical and can keep her up at night. Occ racing heart or  feeling hot.  Caffeine variable.  Plan:Plan: Increase methylphenidate from 36 to 54 mg in the morning. Increase amitriptyline to 6 tablets in the evening    11/12/19 TC noting call: Pt called stated she's not feeling well at all. Ask for sooner apt than 10/7. Depression, anxiety & focus is much worse. Has not planned to hurt herself but thoughts of things would be better if I was not here have crossed her mind often. Contact ASAP @ [phone].  I put Pt on canc list  MD response: I reviewed the meds and previous med lists.  The next options are either Spravato or a major med change from amitriptyline which is not something I can do outside of an appt.  She's on cancellation list and we'll try to work her in sooner.  Nothing I can change right now except since the increase in amitriptyline has not helped, she can reduce it to 200 mg nightly in preparation for other changes to come. Nursing response after talking with pt: Discussed symptoms and medications with patient, she did not increase her Amitriptyline 50 mg up to 6 tablets so she will start that today. Said you may have told her that but she didn't remember. She has read about Spravato but she is not wanting to proceed with that. Informed her she's on cancellation list as well. She will call back with worsening signs or symptoms.  11/23/19  appt with the following noted: Forgot to increase amitriptyline until noted. Tolerating it OK but hasn't had time to help. More alert with Concerta increase to 54 mg daily also with modafinil.  A little more focused and better productivity.  Still forgetful.   No SE noted except GI issues from diarrhea to constipation.   Still miserable and cry easily.  Try to numb herself.  Always tired especially emotionally.  Anxiety is high and predates current stimulants. No therapy since [facility].  She felt he pushed too hard for her to make changes.  I don't feel like I can deal with him right now.   Past Psychiatric Medication Trials: Failed ECT and TMS,  amitriptyline helped for 2 to 3 years in 1987,  Trintellix, Emsam, duloxetine 120, Lexapro 20, Fetzima, amitriptyline 300,  Wellbutrin, fluoxetine, paroxetine, sertraline, Nortriptyline 200 with a therapeutic blood level. lithium SE, Latuda 120, Abilify 15, Seroquel 400, risperidone, Geodon, Vraylar, Rexulti, Haldol for agitation, lamotrigine 300,  buspirone, clonazepam, gabapentin, modafinil, pramipexole,  Nuvigil, Vyvanse, Trazodone, propranolol history of overdose on Xanax, There is a history of suicide attempts and psychiatric hospitalizations.  Review of Systems:  Review of Systems  Constitutional: Positive for fatigue.  Cardiovascular: Negative for chest pain and palpitations.  Musculoskeletal: Positive for back pain and myalgias. Negative for gait problem.  Neurological: Positive for tremors and weakness. Negative for dizziness.  Psychiatric/Behavioral: Positive for depression. Negative for confusion, decreased concentration, hallucinations and self-injury. The patient is nervous/anxious.     Medications: I have reviewed the patient's current medications.  Current Outpatient Medications  Medication Sig Dispense Refill  . amitriptyline (ELAVIL) 50 MG tablet TAKE SIX (6) TABLETS AT BEDTIME 180 tablet 2  . atorvastatin (LIPITOR) 10  MG tablet TAKE 1 TABLET ONCE DAILY. 30 tablet 0  . celecoxib (CELEBREX) 200 MG capsule TAKE (1) CAPSULE TWICE DAILY. 60 capsule 0  . doxycycline (VIBRAMYCIN) 100 MG capsule Take 100 mg by mouth daily.    Marland Kitchen glipiZIDE (GLUCOTROL XL) 10 MG 24 hr tablet Take 10 mg by mouth 2 (two) times daily.    Marland Kitchen  JARDIANCE 25 MG TABS tablet Take 25 mg by mouth daily.    Marland Kitchen lithium carbonate (LITHOBID) 300 MG CR tablet TAKE 3 TABLETS AT BEDTIME. 90 tablet 5  . pantoprazole (PROTONIX) 40 MG tablet TAKE 1 TABLET ONCE DAILY. 30 tablet 0  . propranolol (INDERAL) 20 MG tablet TAKE 4 TABLETS TWICE DAILY. 240 tablet 0  . SAXENDA 18 MG/3ML SOPN Inject into the skin.    Marland Kitchen tretinoin (RETIN-A) 0.025 % cream Apply 1 application topically at bedtime.    . TRULICITY 3 MG/0.5ML SOPN     . Vitamin D, Ergocalciferol, (DRISDOL) 1.25 MG (50000 UNIT) CAPS capsule 2 weekly 10 capsule 5  . XARELTO 10 MG TABS tablet TAKE 1 TABLET ONCE DAILY WITH FOOD. 30 tablet 0  . Armodafinil 200 MG TABS Take 200 mg by mouth every morning. 30 tablet 1  . methylphenidate 54 MG PO CR tablet Take 1 tablet (54 mg total) by mouth daily. 90 tablet 0   No current facility-administered medications for this visit.    Medication Side Effects: Sedation  Allergies:  Allergies  Allergen Reactions  . Tramadol Other (See Comments)    Tingling all over     Past Medical History:  Diagnosis Date  . Anxiety   . Asthma   . Depression   . Diabetes mellitus type 2 in obese (HCC) 06/11/2016  . Embolism - blood clot January 1997 & January 2017   Also had LLE DVT in 1997  . Hypertension   . Preeclampsia 10/10/2011   1999   . Sleep apnea     Family History  Problem Relation Age of Onset  . Dementia Father   . Heart disease Father   . Clotting disorder Mother   . Arthritis Mother   . Clotting disorder Sister   . Clotting disorder Maternal Grandmother   . Clotting disorder Maternal Aunt   . Rheumatologic disease Neg Hx   . Hyperparathyroidism Neg Hx      Social History   Socioeconomic History  . Marital status: Married    Spouse name: Not on file  . Number of children: Y  . Years of education: Not on file  . Highest education level: Not on file  Occupational History  . Occupation: admin assist    Comment: Engineer, manufacturing systems  Tobacco Use  . Smoking status: Former Smoker    Packs/day: 1.00    Years: 20.00    Pack years: 20.00    Quit date: 03/12/2004    Years since quitting: 15.7  . Smokeless tobacco: Never Used  Substance and Sexual Activity  . Alcohol use: Yes    Alcohol/week: 1.0 standard drink    Types: 1 Cans of beer per week    Comment: 1-2 times a week  . Drug use: No  . Sexual activity: Yes    Partners: Male    Birth control/protection: None  Other Topics Concern  . Not on file  Social History Narrative   Originally from [place]. Always lived in [place]. 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 No international travel recently. Previously has been to [place] in May 1996. No mold exposure recently. No bird exposure. Does have multiple pets.    Social Determinants of Health   Financial Resource Strain:   . Difficulty of Paying Living Expenses: Not on file  Food Insecurity:   . Worried About Programme researcher, broadcasting/film/video in the Last Year: Not on file  . Ran Out of Food in the Last Year: Not on  file  Transportation Needs:   . Freight forwarder (Medical): Not on file  . Lack of Transportation (Non-Medical): Not on file  Physical Activity:   . Days of Exercise per Week: Not on file  . Minutes of Exercise per Session: Not on file  Stress:   . Feeling of Stress : Not on file  Social Connections:   . Frequency of Communication with Friends and Family: Not on file  . Frequency of Social Gatherings with Friends and Family: Not on file  . Attends Religious Services: Not on file  . Active Member of Clubs or Organizations: Not on file  . Attends Banker Meetings: Not on file  . Marital Status: Not on file   Intimate Partner Violence:   . Fear of Current or Ex-Partner: Not on file  . Emotionally Abused: Not on file  . Physically Abused: Not on file  . Sexually Abused: Not on file    Past Medical History, Surgical history, Social history, and Family history were reviewed and updated as appropriate.   Please see review of systems for further details on the patient's review from today.   Objective:   Physical Exam:  LMP  (LMP Unknown) Comment: last period ---  1 year ago (?)  Physical Exam Neurological:     Mental Status: She is alert and oriented to person, place, and time.     Cranial Nerves: No dysarthria.  Psychiatric:        Attention and Perception: Attention and perception normal.        Mood and Affect: Mood is anxious and depressed. Affect is not tearful.        Speech: Speech normal.        Behavior: Behavior is cooperative.        Thought Content: Thought content normal. Thought content is not paranoid or delusional. Thought content does not include homicidal or suicidal ideation. Thought content does not include homicidal or suicidal plan.        Cognition and Memory: Cognition and memory normal.        Judgment: Judgment normal.     Comments: Insight intact     Lab Review:     Component Value Date/Time   NA 140 12/11/2018 1121   NA 140 12/11/2016 1236   K 4.1 12/11/2018 1121   K 4.2 12/11/2016 1236   CL 106 12/11/2018 1121   CO2 24 12/11/2018 1121   CO2 24 12/11/2016 1236   GLUCOSE 159 (H) 12/11/2018 1121   GLUCOSE 109 12/11/2016 1236   BUN 10 12/11/2018 1121   BUN 15.1 12/11/2016 1236   CREATININE 0.61 12/11/2018 1121   CREATININE 0.8 12/11/2016 1236   CALCIUM 9.5 12/11/2018 1121   CALCIUM 10.0 12/11/2016 1236   PROT 6.7 06/05/2017 1040   PROT 7.5 12/11/2016 1236   ALBUMIN 4.4 03/21/2017 1210   ALBUMIN 4.4 12/11/2016 1236   AST 28 06/05/2017 1040   AST 38 (H) 12/11/2016 1236   ALT 39 (H) 06/05/2017 1040   ALT 61 (H) 12/11/2016 1236   ALKPHOS 70  03/21/2017 1210   ALKPHOS 71 12/11/2016 1236   BILITOT 0.4 06/05/2017 1040   BILITOT 0.62 12/11/2016 1236   GFRNONAA 105 12/11/2018 1121   GFRAA 122 12/11/2018 1121       Component Value Date/Time   WBC 9.7 10/10/2017 1014   RBC 4.65 10/10/2017 1014   HGB 14.2 10/10/2017 1014   HGB 14.5 12/11/2016 1236   HCT 42.5 10/10/2017 1014  HCT 43.9 12/11/2016 1236   PLT 304.0 10/10/2017 1014   PLT 289 12/11/2016 1236   PLT 295 07/24/2016 1848   MCV 91.4 10/10/2017 1014   MCV 93.2 12/11/2016 1236   MCH 29.7 06/05/2017 1040   MCHC 33.3 10/10/2017 1014   RDW 13.9 10/10/2017 1014   RDW 13.5 12/11/2016 1236   LYMPHSABS 2,330 06/05/2017 1040   LYMPHSABS 2.4 12/11/2016 1236   MONOABS 0.7 12/11/2016 1236   EOSABS 692 (H) 06/05/2017 1040   EOSABS 0.7 (H) 12/11/2016 1236   BASOSABS 73 06/05/2017 1040   BASOSABS 0.0 12/11/2016 1236    Lithium Lvl  Date Value Ref Range Status  12/11/2018 0.8 0.6 - 1.2 mmol/L Final    Serum nortriptyline level on 150 mg a day was 109  Amitriptyline  Level 12/11/2018 =237  On 250 mg daily,.  No results found for: PHENYTOIN, PHENOBARB, VALPROATE, CBMZ   .res Assessment: Plan:    Severe recurrent major depression without psychotic features (HCC) - Plan: methylphenidate 54 MG PO CR tablet  Generalized anxiety disorder  Hypersomnolence - Plan: Armodafinil 200 MG TABS  Obstructive sleep apnea - Plan: methylphenidate 54 MG PO CR tablet, Armodafinil 200 MG TABS   Deborah Soto has chronic severe major depression and generalized anxiety that is treatment resistant and failed multiple medications ECT and TMS as noted above including SSRIs, try cyclic's, lithium, and atypical antipsychotics for depression.. She has had partial benefit from amitriptyline plus lithium.  However she is having tremor problems from the lithium.  Efforts to reduce the lithium have led to worsening psychiatric symptoms.  There has been a discussion about possible bipolar elements but she  has no clear manic episodes unrelated to medication changes.  She has features of atypical depression.   Discussed Spravato option in detail.  We will discuss it again at follow-up appointment.  She was given information to read in the interim and the treatment plan was discussed in terms of frequency and side effects.  Therefore consideration could be given to other trials of MAO inhibitors as they are classically more effective than other antidepressants and atypical depression.  She did fail a trial of Emsam.  Consideration could be given to using tablet version of selegiline and going to much higher doses or the use of Parnate or Nardil.. Other option for TRD not tried is Symbyax but weight gain risk.  Overall depression is improved with amitriptyline in combination with the lithium.  She still has significant anxiety.  Irritability was worse at lower doses of lithium than 900 mg daily.  She is also having problems with tremor.  She is also having some cognitive problems.  Serum amitriptyline levels noted and discussed in detail at the last appointment.  They are in the high normal range but not near the toxic range of 500.    The increase in amitriptyline to 300 mg daily needs more time.  We will check lithium level  Disc Dr. Juluis Mireohmeir's recommendation to add Modafinil to Concerta .  This has been helpful for productivity.    Rec counseling.  Last did April 2019 after a hospitalization. She's uncommitted.  Encourage physical activity and weight loss.  She is not particularly motivated and feels that is hard to accomplish because of chronic pain.  Chronic pain complicates her treatment of depression.  B6 for lithium tremor. 500 mg BID. If needed.  Tremor is better.  Try to take lithium also 4-5 hours before sleep to minimize daytime tremor. depression was worse  after reducing the lithium.  Irritability was also worse after reducing the lithium to 600 mg daily.  Continue methylphenidate ER  To  54 mg in the morning.  She's gotten cog and alterntess benefit but not depression benefit.  Disc risk anxiety and call if needed.     History of low vitamin D taking 150K/week but stopped 6 mos ago.  Continue D 100K units weekly.  Follow-up 8 weeks  Deborah Staggers MD, [name]  Future Appointments  Date Time Provider Department Center  12/17/2019  9:30 AM Cottle, Steva Ready., MD [facility] None    No orders of the defined types were placed in this encounter.     -------------------------------

## 2019-11-25 DIAGNOSIS — M4722 Other spondylosis with radiculopathy, cervical region: Secondary | ICD-10-CM | POA: Diagnosis not present

## 2019-11-25 DIAGNOSIS — M9902 Segmental and somatic dysfunction of thoracic region: Secondary | ICD-10-CM | POA: Diagnosis not present

## 2019-11-25 DIAGNOSIS — M9901 Segmental and somatic dysfunction of cervical region: Secondary | ICD-10-CM | POA: Diagnosis not present

## 2019-11-25 DIAGNOSIS — M5412 Radiculopathy, cervical region: Secondary | ICD-10-CM | POA: Diagnosis not present

## 2019-11-30 ENCOUNTER — Telehealth: Payer: Self-pay | Admitting: Psychiatry

## 2019-11-30 NOTE — Telephone Encounter (Signed)
Concerta and modafinil could cause clenching teeth.  We can stop or reduce one or the other to see if she would like.

## 2019-11-30 NOTE — Telephone Encounter (Signed)
Pt called to report she has been clenching her teeth at daytime no sure about night. Would like to know if any meds she is taking would cause this. Over weekend her jaw made a pop sound and hurt very badly. [phone]

## 2019-11-30 NOTE — Telephone Encounter (Signed)
Please review

## 2019-12-01 NOTE — Telephone Encounter (Signed)
Left detailed message with information and to call back if wanting to follow up on those.

## 2019-12-02 DIAGNOSIS — M4722 Other spondylosis with radiculopathy, cervical region: Secondary | ICD-10-CM | POA: Diagnosis not present

## 2019-12-02 DIAGNOSIS — M5412 Radiculopathy, cervical region: Secondary | ICD-10-CM | POA: Diagnosis not present

## 2019-12-02 DIAGNOSIS — M9902 Segmental and somatic dysfunction of thoracic region: Secondary | ICD-10-CM | POA: Diagnosis not present

## 2019-12-02 DIAGNOSIS — M9901 Segmental and somatic dysfunction of cervical region: Secondary | ICD-10-CM | POA: Diagnosis not present

## 2019-12-03 ENCOUNTER — Other Ambulatory Visit: Payer: Self-pay | Admitting: Psychiatry

## 2019-12-09 DIAGNOSIS — M26609 Unspecified temporomandibular joint disorder, unspecified side: Secondary | ICD-10-CM | POA: Diagnosis not present

## 2019-12-09 DIAGNOSIS — R079 Chest pain, unspecified: Secondary | ICD-10-CM | POA: Diagnosis not present

## 2019-12-10 DIAGNOSIS — I10 Essential (primary) hypertension: Secondary | ICD-10-CM | POA: Diagnosis not present

## 2019-12-10 DIAGNOSIS — G4733 Obstructive sleep apnea (adult) (pediatric): Secondary | ICD-10-CM | POA: Diagnosis not present

## 2019-12-14 DIAGNOSIS — M5412 Radiculopathy, cervical region: Secondary | ICD-10-CM | POA: Diagnosis not present

## 2019-12-14 DIAGNOSIS — M4722 Other spondylosis with radiculopathy, cervical region: Secondary | ICD-10-CM | POA: Diagnosis not present

## 2019-12-14 DIAGNOSIS — M9901 Segmental and somatic dysfunction of cervical region: Secondary | ICD-10-CM | POA: Diagnosis not present

## 2019-12-14 DIAGNOSIS — M9902 Segmental and somatic dysfunction of thoracic region: Secondary | ICD-10-CM | POA: Diagnosis not present

## 2019-12-15 DIAGNOSIS — L0889 Other specified local infections of the skin and subcutaneous tissue: Secondary | ICD-10-CM | POA: Diagnosis not present

## 2019-12-15 DIAGNOSIS — L700 Acne vulgaris: Secondary | ICD-10-CM | POA: Diagnosis not present

## 2019-12-17 ENCOUNTER — Ambulatory Visit: Payer: BC Managed Care – PPO | Admitting: Psychiatry

## 2019-12-21 DIAGNOSIS — M5412 Radiculopathy, cervical region: Secondary | ICD-10-CM | POA: Diagnosis not present

## 2019-12-21 DIAGNOSIS — M4722 Other spondylosis with radiculopathy, cervical region: Secondary | ICD-10-CM | POA: Diagnosis not present

## 2019-12-21 DIAGNOSIS — M9901 Segmental and somatic dysfunction of cervical region: Secondary | ICD-10-CM | POA: Diagnosis not present

## 2019-12-21 DIAGNOSIS — M9902 Segmental and somatic dysfunction of thoracic region: Secondary | ICD-10-CM | POA: Diagnosis not present

## 2019-12-31 DIAGNOSIS — G629 Polyneuropathy, unspecified: Secondary | ICD-10-CM | POA: Diagnosis not present

## 2020-01-04 ENCOUNTER — Other Ambulatory Visit: Payer: Self-pay | Admitting: Psychiatry

## 2020-01-06 DIAGNOSIS — M9901 Segmental and somatic dysfunction of cervical region: Secondary | ICD-10-CM | POA: Diagnosis not present

## 2020-01-06 DIAGNOSIS — M5412 Radiculopathy, cervical region: Secondary | ICD-10-CM | POA: Diagnosis not present

## 2020-01-06 DIAGNOSIS — M9902 Segmental and somatic dysfunction of thoracic region: Secondary | ICD-10-CM | POA: Diagnosis not present

## 2020-01-06 DIAGNOSIS — M4722 Other spondylosis with radiculopathy, cervical region: Secondary | ICD-10-CM | POA: Diagnosis not present

## 2020-01-12 ENCOUNTER — Ambulatory Visit (INDEPENDENT_AMBULATORY_CARE_PROVIDER_SITE_OTHER): Payer: BC Managed Care – PPO | Admitting: Psychiatry

## 2020-01-12 ENCOUNTER — Encounter: Payer: Self-pay | Admitting: Psychiatry

## 2020-01-12 ENCOUNTER — Other Ambulatory Visit: Payer: Self-pay

## 2020-01-12 DIAGNOSIS — F332 Major depressive disorder, recurrent severe without psychotic features: Secondary | ICD-10-CM | POA: Diagnosis not present

## 2020-01-12 DIAGNOSIS — G4733 Obstructive sleep apnea (adult) (pediatric): Secondary | ICD-10-CM | POA: Diagnosis not present

## 2020-01-12 DIAGNOSIS — F411 Generalized anxiety disorder: Secondary | ICD-10-CM | POA: Diagnosis not present

## 2020-01-12 DIAGNOSIS — R7989 Other specified abnormal findings of blood chemistry: Secondary | ICD-10-CM

## 2020-01-12 DIAGNOSIS — G4710 Hypersomnia, unspecified: Secondary | ICD-10-CM | POA: Diagnosis not present

## 2020-01-12 NOTE — Patient Instructions (Signed)
Reduce amitriptyline by 1 tablet every 4 days. Start Viibryd 10 mg daily with food for 7 days, then 20 mg daily for 7 days, then increase to 40 mg daily.

## 2020-01-12 NOTE — Progress Notes (Signed)
Deborah Soto [id number] 28-Apr-1965 54 y.o.     Subjective:   Patient ID:  Deborah Soto is a 54 y.o. (DOB 22-Jul-1965) female.  Chief Complaint:  Chief Complaint  Patient presents with  . Follow-up  . Depression  . Anxiety   Depression        Associated symptoms include fatigue and myalgias.  Associated symptoms include no decreased concentration.    Deborah Soto presents to the office today for follow-up of TRD and anxiety, and insomnia.  seen December 08, 2018.  She was complaining of lithium tremor and some cognitive problems.  Hydroxyzine was stopped.  Propranolol increased to 60 mg twice daily for tremor.  Lithium level and amitriptyline levels were requested. Amitriptyline level to 37, nortriptyline 49 on amitriptyline 250 mg nightly. Lithium level 0.800 mg daily  Phone call on October 6 to discuss lab results as follows: Note   ----- Message from Deborah Soto., MD sent at 12/15/2018 10:08 AM EDT ----- Lithium level stable at 0.8 in a good range.  The previous was 0.7.  She is having some tremor issues.  Normal BMP including excellent creatinine and calcium levels.  Blood sugar is high but she is aware of that problem.  TSH is within normal limits.  Serum amitriptyline level is 286 which is at the upper end of the normal range and suggestive that we not try further increase.  We will discuss further options at her follow-up appointment.  No medication adjustments required, but because of her tremor and her lithium level is slightly higher than it was with the prior level if she wants to reduce the lithium from 3 of the 300 mg tablets daily to 2-1/2 of the 300 mg tablets daily she can do so.  At her last appointment she was also encouraged to try a higher dose of propranolol and that may have resolved the problem.  If it did do not reduce the lithium but if it did not and she wants to reduce the dose she can do so as noted.  She should call us if she has  any recurrence of symptoms.  Deborah Staggers MD, [name]     She had repeat lithium level at 750 mg daily of 0.  7 with some improvement in tremor.  In phone call on November 20 she reported mood was worse with weepiness, overreacting, "pity party".  Because mood symptoms were worse with the reduction lithium she was encouraged to increase the dosage back to 900 mg daily.   Last seen February 04, 2019.  The following changes were made: Increase propranolol to 80 mg twice daily for lithium tremor and anxiety her blood pressure and pulse appear high enough that she can tolerate this dosage.  Disc Se.  Disc risk with low BP and DM.  He has not been having any problems with low blood sugar. Amitriptyline 6 or 300 mg daily if tolerated.  Try to take some about 4 -5 hours before sleep Try to take lithium also 4-5 hours before sleep to minimize daytime tremor Add methylphenidate ER 27 mg in the morning.  If no response we will increase the dosage.  Seen May 08, 2019 with her husband. Aside from physically feeling like she can't do much then having memory issues.  Forgetful.  Loses track of thoughts between tasks.  Most embarrassing being around people and word-finding issues.   Problems with walking bc balance and weakness.  Fears falling. Only fall at Xmas morning.  Tremor  is awful.  Affects keying.   H notices cognitive problems and forgetfulness.    Shaking got really bad and got lithium level as low as 600 mg but the next day felt really helpless and everything seemed to be aimed at hurting me and felt disrespected.  Problems with boss and 54 yo taking 5 AP classes and 2 volleyball classes.  I've been on her to accomplish all these things.  She claimed that pt screaming at her.   H CO she was more irritable after the reduction in lithium.   H CO her anxiety also seems worse.  H says the last year has been unhealthy with 12 hour work days for her.   Doesn't go out much.    Attention problems more  noticeable.  Still dropping and tremor.  Tolerated the increase in propranolol.    CO shakiness from lithium which interferes with typing. Also a good bit of anxiety generally without panic.  BS are high.  NotTaking hydroxyzine.   sleeping better and still using CPAP.  Using CPAP regularly. Less awakening than before.  No SE. depression was worse after reducing the lithium.  Irritability was also worse after reducing the lithium to 600 mg daily.  seen May 08, 2019.  Multiple changes made:  Reduce propranolol to 60 mg twice daily for lithium tremor and anxiety since the increase was not helpful.   Restart B6 for lithium tremor. 500 mg BID. She forgot and doesn't want more. Try to take lithium also 4-5 hours before sleep to minimize daytime tremor. depression was worse after reducing the lithium.  Irritability was also worse after reducing the lithium to 600 mg daily. So reluctant to reduce it further.  It is unclear how to address the benefits of lithium without using lithium other than to consider Spravato Increase methylphenidate ER 36 mg in the morning. Increase methylphenidate ER 36 mg in the morning. Wait 1-2 weeks then reduce amitriptyline to 5 tablets nightly to see if cognition is better. Also saw Dr. Richardean Soto re: sleep disorder.  05/2019 appt with the following noted: Lost Rx of Concerta 36 so didn't take it long.  Out of it.  Still shakey and cognitive problems.  Dep 5/10/  Anxiety 6/10 worse at night.  No SI.  8 hours sleep.  Mild panic couple times daily. Frustrated with tremor and sleepiness which is worse than tiredness.   History of low vitamin D taking 150K/week but stopped 6 mos ago. Plan : Modafinil 100 mg tablet 1 dailly for 1 week, Then add Concerta 36 mg 1 each AM.  Option increase modafinil mid April  Restart B6 for lithium tremor. 500 mg BID. Get lab test at earliest convience  10/14/2019 appointment with the following noted: Has been on amitriptyline 250 mg for  mos.  Tried a week ago reducing to 200 and felt worse so back on 250. Weaned off Lyrica about a month ago.  Not much change off it.   Not sure if it helped energy or alertness. Saw Dr. Nonah MattesExert in consultation. Mental clarity is a lot better with stimulants and not sleeping as much in daytime with current meds.  Severe drowsiness is a lot better.   Off metformin and added Glipizide.   Not great with depression 5/10.  Anxiety can trigger problems with thinking and somatic sx   I feel like it's something physical and can keep her up at night. Occ racing heart or feeling hot.  Caffeine variable.  Plan:Plan: Increase methylphenidate  from 36 to 54 mg in the morning. Increase amitriptyline to 6 tablets in the evening    11/12/19 TC noting call: Pt called stated she's not feeling well at all. Ask for sooner apt than 10/7. Depression, anxiety & focus is much worse. Has not planned to hurt herself but thoughts of things would be better if I was not here have crossed her mind often. Contact ASAP @ [phone].  I put Pt on canc list  MD response: I reviewed the meds and previous med lists.  The next options are either Spravato or a major med change from amitriptyline which is not something I can do outside of an appt.  She's on cancellation list and we'll try to work her in sooner.  Nothing I can change right now except since the increase in amitriptyline has not helped, she can reduce it to 200 mg nightly in preparation for other changes to come. Nursing response after talking with pt: Discussed symptoms and medications with patient, she did not increase her Amitriptyline 50 mg up to 6 tablets so she will start that today. Said you may have told her that but she didn't remember. She has read about Spravato but she is not wanting to proceed with that. Informed her she's on cancellation list as well. She will call back with worsening signs or symptoms.  11/23/19 appt with the following noted: Forgot to increase  amitriptyline until noted. Tolerating it OK but hasn't had time to help. More alert with Concerta increase to 54 mg daily also with modafinil.  A little more focused and better productivity.  Still forgetful.   No SE noted except GI issues from diarrhea to constipation.   Still miserable and cry easily.  Try to numb herself.  Always tired especially emotionally.  Anxiety is high and predates current stimulants. No therapy since [facility].  She felt he pushed too hard for her to make changes.  I don't feel like I can deal with him right now. Plan: Continue meds & The increase in amitriptyline to 300 mg daily needs more time.  01/12/2020 appointment with the following noted: Not well.  Mood horrible.  Easily frustrated sometimes without reason and may nap or cry.  Anxiety is pretty bad.  Daytime sleepiness seems worse.  Can get nausea from anxiety.  Feels like function is slow.  Have to write everything down.  Hates home, job, high school volleyball program.  At some point I have to take responsibility.   Amitriptyline also not helping pain any.  Clenching jaw for 3 weeks. Likcking lips.  Picking fingers. D will play volleyball at [facility] and [place].   Past Psychiatric Medication Trials: Failed ECT and TMS,  amitriptyline helped for 2 to 3 years in 1987,  Nortriptyline 200 with a therapeutic blood level. Trintellix, Emsam, duloxetine 120, Lexapro 20, Fetzima, amitriptyline 300,  Wellbutrin, fluoxetine, paroxetine, sertraline, lithium SE, Latuda 120, Abilify 15, Seroquel 400, risperidone, Geodon, Vraylar, Rexulti, Haldol for agitation, lamotrigine 300,  buspirone, clonazepam, gabapentin, modafinil, pramipexole,  Nuvigil, Vyvanse, Trazodone, propranolol history of overdose on Xanax, There is a history of suicide attempts and psychiatric hospitalizations.  Review of Systems:  Review of Systems  Constitutional: Positive for fatigue.  Cardiovascular: Negative for chest pain and palpitations.   Gastrointestinal: Negative for diarrhea.  Musculoskeletal: Positive for back pain and myalgias. Negative for gait problem.  Neurological: Positive for tremors and weakness. Negative for dizziness.  Psychiatric/Behavioral: Positive for depression. Negative for confusion, decreased concentration, hallucinations and self-injury.  The patient is nervous/anxious.     Medications: I have reviewed the patient's current medications.  Current Outpatient Medications  Medication Sig Dispense Refill  . amitriptyline (ELAVIL) 50 MG tablet TAKE SIX (6) TABLETS AT BEDTIME 180 tablet 2  . Armodafinil 200 MG TABS Take 200 mg by mouth every morning. 30 tablet 1  . atorvastatin (LIPITOR) 10 MG tablet TAKE 1 TABLET ONCE DAILY. 30 tablet 0  . celecoxib (CELEBREX) 200 MG capsule TAKE (1) CAPSULE TWICE DAILY. 60 capsule 0  . doxycycline (VIBRAMYCIN) 100 MG capsule Take 100 mg by mouth daily.    Marland Kitchen glipiZIDE (GLUCOTROL XL) 10 MG 24 hr tablet Take 10 mg by mouth 2 (two) times daily.    Marland Kitchen JARDIANCE 25 MG TABS tablet Take 25 mg by mouth daily.    Marland Kitchen lithium carbonate (LITHOBID) 300 MG CR tablet TAKE 3 TABLETS AT BEDTIME. 90 tablet 5  . methylphenidate 54 MG PO CR tablet Take 1 tablet (54 mg total) by mouth daily. 90 tablet 0  . pantoprazole (PROTONIX) 40 MG tablet TAKE 1 TABLET ONCE DAILY. 30 tablet 0  . propranolol (INDERAL) 20 MG tablet TAKE 4 TABLETS TWICE DAILY. 240 tablet 0  . SAXENDA 18 MG/3ML SOPN Inject into the skin.    Marland Kitchen tretinoin (RETIN-A) 0.025 % cream Apply 1 application topically at bedtime.    . TRULICITY 3 MG/0.5ML SOPN     . Vitamin D, Ergocalciferol, (DRISDOL) 1.25 MG (50000 UNIT) CAPS capsule 2 weekly 10 capsule 5  . XARELTO 10 MG TABS tablet TAKE 1 TABLET ONCE DAILY WITH FOOD. 30 tablet 0   No current facility-administered medications for this visit.    Medication Side Effects: Sedation  Allergies:  Allergies  Allergen Reactions  . Tramadol Other (See Comments)    Tingling all over      Past Medical History:  Diagnosis Date  . Anxiety   . Asthma   . Depression   . Diabetes mellitus type 2 in obese (HCC) 06/11/2016  . Embolism - blood clot January 1997 & January 2017   Also had LLE DVT in 1997  . Hypertension   . Preeclampsia 10/10/2011   1999   . Sleep apnea     Family History  Problem Relation Age of Onset  . Dementia Father   . Heart disease Father   . Clotting disorder Mother   . Arthritis Mother   . Clotting disorder Sister   . Clotting disorder Maternal Grandmother   . Clotting disorder Maternal Aunt   . Rheumatologic disease Neg Hx   . Hyperparathyroidism Neg Hx     Social History   Socioeconomic History  . Marital status: Married    Spouse name: Not on file  . Number of children: Y  . Years of education: Not on file  . Highest education level: Not on file  Occupational History  . Occupation: admin assist    Comment: Engineer, manufacturing systems  Tobacco Use  . Smoking status: Former Smoker    Packs/day: 1.00    Years: 20.00    Pack years: 20.00    Quit date: 03/12/2004    Years since quitting: 15.8  . Smokeless tobacco: Never Used  Substance and Sexual Activity  . Alcohol use: Yes    Alcohol/week: 1.0 standard drink    Types: 1 Cans of beer per week    Comment: 1-2 times a week  . Drug use: No  . Sexual activity: Yes    Partners: Male  Birth control/protection: None  Other Topics Concern  . Not on file  Social History Narrative   Originally from [place]. Always lived in [place]. 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 No international travel recently. Previously has been to [place] in May 1996. No mold exposure recently. No bird exposure. Does have multiple pets.    Social Determinants of Health   Financial Resource Strain:   . Difficulty of Paying Living Expenses: Not on file  Food Insecurity:   . Worried About Programme researcher, broadcasting/film/video in the Last Year: Not on file  . Ran Out of Food in the Last Year: Not on file  Transportation  Needs:   . Lack of Transportation (Medical): Not on file  . Lack of Transportation (Non-Medical): Not on file  Physical Activity:   . Days of Exercise per Week: Not on file  . Minutes of Exercise per Session: Not on file  Stress:   . Feeling of Stress : Not on file  Social Connections:   . Frequency of Communication with Friends and Family: Not on file  . Frequency of Social Gatherings with Friends and Family: Not on file  . Attends Religious Services: Not on file  . Active Member of Clubs or Organizations: Not on file  . Attends Banker Meetings: Not on file  . Marital Status: Not on file  Intimate Partner Violence:   . Fear of Current or Ex-Partner: Not on file  . Emotionally Abused: Not on file  . Physically Abused: Not on file  . Sexually Abused: Not on file    Past Medical History, Surgical history, Social history, and Family history were reviewed and updated as appropriate.   Please see review of systems for further details on the patient's review from today.   Objective:   Physical Exam:  LMP  (LMP Unknown) Comment: last period ---  1 year ago (?)  Physical Exam Constitutional:      General: She is not in acute distress. Musculoskeletal:        General: No deformity.  Neurological:     Mental Status: She is alert and oriented to person, place, and time.     Cranial Nerves: No dysarthria.     Coordination: Coordination normal.  Psychiatric:        Attention and Perception: Attention and perception normal. She does not perceive auditory or visual hallucinations.        Mood and Affect: Mood is anxious and depressed. Affect is not labile, blunt, angry, tearful or inappropriate.        Speech: Speech normal.        Behavior: Behavior normal. Behavior is cooperative.        Thought Content: Thought content normal. Thought content is not paranoid or delusional. Thought content does not include homicidal or suicidal ideation. Thought content does not include  homicidal or suicidal plan.        Cognition and Memory: Cognition and memory normal.        Judgment: Judgment normal.     Comments: Insight intact Depression no better     Lab Review:     Component Value Date/Time   NA 140 12/11/2018 1121   NA 140 12/11/2016 1236   K 4.1 12/11/2018 1121   K 4.2 12/11/2016 1236   CL 106 12/11/2018 1121   CO2 24 12/11/2018 1121   CO2 24 12/11/2016 1236   GLUCOSE 159 (H) 12/11/2018 1121   GLUCOSE 109 12/11/2016 1236   BUN  10 12/11/2018 1121   BUN 15.1 12/11/2016 1236   CREATININE 0.61 12/11/2018 1121   CREATININE 0.8 12/11/2016 1236   CALCIUM 9.5 12/11/2018 1121   CALCIUM 10.0 12/11/2016 1236   PROT 6.7 06/05/2017 1040   PROT 7.5 12/11/2016 1236   ALBUMIN 4.4 03/21/2017 1210   ALBUMIN 4.4 12/11/2016 1236   AST 28 06/05/2017 1040   AST 38 (H) 12/11/2016 1236   ALT 39 (H) 06/05/2017 1040   ALT 61 (H) 12/11/2016 1236   ALKPHOS 70 03/21/2017 1210   ALKPHOS 71 12/11/2016 1236   BILITOT 0.4 06/05/2017 1040   BILITOT 0.62 12/11/2016 1236   GFRNONAA 105 12/11/2018 1121   GFRAA 122 12/11/2018 1121       Component Value Date/Time   WBC 9.7 10/10/2017 1014   RBC 4.65 10/10/2017 1014   HGB 14.2 10/10/2017 1014   HGB 14.5 12/11/2016 1236   HCT 42.5 10/10/2017 1014   HCT 43.9 12/11/2016 1236   PLT 304.0 10/10/2017 1014   PLT 289 12/11/2016 1236   PLT 295 07/24/2016 1848   MCV 91.4 10/10/2017 1014   MCV 93.2 12/11/2016 1236   MCH 29.7 06/05/2017 1040   MCHC 33.3 10/10/2017 1014   RDW 13.9 10/10/2017 1014   RDW 13.5 12/11/2016 1236   LYMPHSABS 2,330 06/05/2017 1040   LYMPHSABS 2.4 12/11/2016 1236   MONOABS 0.7 12/11/2016 1236   EOSABS 692 (H) 06/05/2017 1040   EOSABS 0.7 (H) 12/11/2016 1236   BASOSABS 73 06/05/2017 1040   BASOSABS 0.0 12/11/2016 1236    Lithium Lvl  Date Value Ref Range Status  12/11/2018 0.8 0.6 - 1.2 mmol/L Final    Serum nortriptyline level on 150 mg a day was 109  Amitriptyline  Level 12/11/2018 =237  On  250 mg daily,.    No results found for: PHENYTOIN, PHENOBARB, VALPROATE, CBMZ   .res Assessment: Plan:    Severe recurrent major depression without psychotic features (HCC)  Generalized anxiety disorder  Hypersomnolence  Obstructive sleep apnea  Low vitamin D level   Cooper has chronic severe major depression and generalized anxiety that is treatment resistant and failed multiple medications ECT and TMS as noted above including SSRIs, try cyclic's, lithium, and atypical antipsychotics for depression.. She has had partial benefit from amitriptyline plus lithium.  However she is having tremor problems from the lithium.  Efforts to reduce the lithium have led to worsening psychiatric symptoms.  There has been a discussion about possible bipolar elements but she has no clear manic episodes unrelated to medication changes.  She has features of atypical depression.  So an MAO inhibitor is attractive from that perspective.  No benefit from amitriptyline 300 mg with adequate duration.  Will wean off this medicine.  Discussed Spravato option in detail.  We will discuss it again at follow-up appointment.  She was given information to read in the interim and the treatment plan was discussed in terms of frequency and side effects.  Therefore consideration could be given to other trials of MAO inhibitors as they are classically more effective than other antidepressants and atypical depression.  She did fail a trial of Emsam.  Consideration could be given to using tablet version of selegiline and going to much higher doses or the use of Parnate or Nardil.. Other option for TRD not tried is Symbyax but weight gain risk.  Option of adding olanzapine to current antidepressant but she reports she does not check her blood sugar very often and that would not be  a good scenario with olanzapine.  Statistically speaking the most potentially beneficial option would be Parnate or Nardil but they are difficult  to use and switching over the holidays is not ideal.  Because she is likely to feel worse coming off of an antidepressant and waiting to start the MAO inhibitor before she feels better from the MAO inhibitor.  She does not wish to go through that through the holidays.  Switch to Viibryd bc it's the easiest med to use. Start taper amitriptyline 50 mg every 5 days, Reduce amitriptyline by 1 tablet every 4 days. Start Viibryd 10 mg daily with food for 7 days, then 20 mg daily for 7 days, then increase to 40 mg daily.  Overall depression is improved with amitriptyline in combination with the lithium.  She still has significant anxiety.  Irritability was worse at lower doses of lithium than 900 mg daily.  She is also having problems with tremor.  She is also having some cognitive problems.  Serum amitriptyline levels noted and discussed in detail at the last appointment.  They are in the high normal range but not near the toxic range of 500.    We will check lithium level  Disc Dr. Juluis Mire recommendation to add Modafinil to Concerta .  This has been helpful for productivity.    Rec counseling.  Last did April 2019 after a hospitalization. She's uncommitted.  Encourage physical activity and weight loss.  She is not particularly motivated and feels that is hard to accomplish because of chronic pain.  Chronic pain complicates her treatment of depression.  B6 for lithium tremor. 500 mg BID. If needed.  Tremor is better.  Try to take lithium also 4-5 hours before sleep to minimize daytime tremor. depression was worse after reducing the lithium.  Irritability was also worse after reducing the lithium to 600 mg daily.  Continue methylphenidate ER  To 54 mg in the morning.  She's gotten cog and alterntess benefit but not depression benefit.  Disc risk anxiety and call if needed.     History of low vitamin D taking 150K/week but stopped 6 mos ago.  Continue D 100K units weekly.  Follow-up 8  weeks  Deborah Staggers MD, [name]  Future Appointments  Date Time Provider Department Center  03/17/2020  9:30 AM Cottle, Steva Ready., MD [facility] None    No orders of the defined types were placed in this encounter.     -------------------------------

## 2020-01-19 ENCOUNTER — Telehealth: Payer: Self-pay | Admitting: Psychiatry

## 2020-01-19 ENCOUNTER — Other Ambulatory Visit: Payer: Self-pay | Admitting: Psychiatry

## 2020-01-19 DIAGNOSIS — G4733 Obstructive sleep apnea (adult) (pediatric): Secondary | ICD-10-CM

## 2020-01-19 DIAGNOSIS — F332 Major depressive disorder, recurrent severe without psychotic features: Secondary | ICD-10-CM

## 2020-01-19 NOTE — Telephone Encounter (Signed)
Pended for Dr, Jennelle Human to send Last refill 12/16/2019

## 2020-01-19 NOTE — Telephone Encounter (Signed)
Pt called requesting refill Methylphenidate 54 mg 1/d @ [facility]. Apt 1/6

## 2020-01-21 DIAGNOSIS — M5412 Radiculopathy, cervical region: Secondary | ICD-10-CM | POA: Diagnosis not present

## 2020-01-21 DIAGNOSIS — M9902 Segmental and somatic dysfunction of thoracic region: Secondary | ICD-10-CM | POA: Diagnosis not present

## 2020-01-21 DIAGNOSIS — M4722 Other spondylosis with radiculopathy, cervical region: Secondary | ICD-10-CM | POA: Diagnosis not present

## 2020-01-21 DIAGNOSIS — M9901 Segmental and somatic dysfunction of cervical region: Secondary | ICD-10-CM | POA: Diagnosis not present

## 2020-02-05 ENCOUNTER — Other Ambulatory Visit: Payer: Self-pay | Admitting: Psychiatry

## 2020-02-05 DIAGNOSIS — G4710 Hypersomnia, unspecified: Secondary | ICD-10-CM

## 2020-02-05 DIAGNOSIS — G4733 Obstructive sleep apnea (adult) (pediatric): Secondary | ICD-10-CM

## 2020-02-07 NOTE — Telephone Encounter (Signed)
Apt 03/18/19

## 2020-02-08 ENCOUNTER — Telehealth: Payer: Self-pay | Admitting: Psychiatry

## 2020-02-08 NOTE — Telephone Encounter (Signed)
Pt left a message stating that she wants a script sent to the [facility] on viibryd

## 2020-02-09 ENCOUNTER — Other Ambulatory Visit: Payer: Self-pay

## 2020-02-09 DIAGNOSIS — I878 Other specified disorders of veins: Secondary | ICD-10-CM | POA: Diagnosis not present

## 2020-02-09 DIAGNOSIS — L700 Acne vulgaris: Secondary | ICD-10-CM | POA: Diagnosis not present

## 2020-02-09 DIAGNOSIS — L719 Rosacea, unspecified: Secondary | ICD-10-CM | POA: Diagnosis not present

## 2020-02-09 MED ORDER — VIIBRYD 40 MG PO TABS: 40.0000 mg | ORAL_TABLET | Freq: Every day | ORAL | 1 refills | Status: DC

## 2020-02-09 NOTE — Telephone Encounter (Signed)
Contacted patient with dose and she is just starting the 40 mg Viibryd today. Asks for that dose. Dicussed it will probably need a PA and if she needs samples before then to let us know.

## 2020-02-11 DIAGNOSIS — M5412 Radiculopathy, cervical region: Secondary | ICD-10-CM | POA: Diagnosis not present

## 2020-02-11 DIAGNOSIS — M9901 Segmental and somatic dysfunction of cervical region: Secondary | ICD-10-CM | POA: Diagnosis not present

## 2020-02-11 DIAGNOSIS — M4722 Other spondylosis with radiculopathy, cervical region: Secondary | ICD-10-CM | POA: Diagnosis not present

## 2020-02-11 DIAGNOSIS — M9902 Segmental and somatic dysfunction of thoracic region: Secondary | ICD-10-CM | POA: Diagnosis not present

## 2020-02-12 ENCOUNTER — Telehealth: Payer: Self-pay

## 2020-02-12 NOTE — Telephone Encounter (Signed)
Prior authorization submitted and approved for VIIBRYD 40 MG effective 02/12/2020-02/10/2021 with BCBS ID# [id number]

## 2020-02-18 ENCOUNTER — Telehealth: Payer: Self-pay | Admitting: Psychiatry

## 2020-02-18 ENCOUNTER — Other Ambulatory Visit: Payer: Self-pay

## 2020-02-18 MED ORDER — VIIBRYD 40 MG PO TABS
40.0000 mg | ORAL_TABLET | Freq: Every day | ORAL | 1 refills | Status: DC
Start: 2020-02-18 — End: 2020-03-02

## 2020-02-18 NOTE — Telephone Encounter (Signed)
Pt LM on VM stating she has been clenching her jaw for a while now. Mostly in the day time and having pain in her ear and ringing in her ears. Contact # [phone]. Apt 1/6

## 2020-02-18 NOTE — Telephone Encounter (Signed)
Please review

## 2020-02-18 NOTE — Telephone Encounter (Signed)
The last changes we made were to wean her off of amitriptyline and start Viibryd.  It is unlikely for those changes to cause the symptoms that she is describing.  The most common cause for those symptoms would be the stimulants most likely methylphenidate or Concerta as opposed to modafinil.  Does she want to reduce the methylphenidate from 54 mg a day to 36 mg a day to see if that is the cause.?

## 2020-02-19 ENCOUNTER — Telehealth: Payer: Self-pay | Admitting: Psychiatry

## 2020-02-19 NOTE — Telephone Encounter (Signed)
Deborah Soto called to follow up on her call from yesterday.  I gave her Dr. Alwyn Ren thoughts and recommendations.  I told her the change in the amitriptyline and start of Viibryd would not cause what she is describing.  That is could be the methylphenidate (Concerta) and would she like to try to reduce that medication back to 36mg ?  She said yes she would do that and she has some 36mg  left over so can use those without needing a new prescription as this time.  She also indicated she had stopped the armodafinil for a couple of days but that's not good because she is losing the benefit of that medication.  So she will start that back up.  She will report back on the affects of reducing the methylphenidate to 36 mg.

## 2020-02-22 ENCOUNTER — Other Ambulatory Visit: Payer: Self-pay

## 2020-02-22 ENCOUNTER — Ambulatory Visit: Payer: BC Managed Care – PPO | Admitting: Family Medicine

## 2020-02-22 ENCOUNTER — Encounter: Payer: Self-pay | Admitting: Family Medicine

## 2020-02-22 VITALS — BP 106/70 | HR 71 | Temp 97.8°F | Ht 68.0 in | Wt 236.0 lb

## 2020-02-22 DIAGNOSIS — R52 Pain, unspecified: Secondary | ICD-10-CM | POA: Diagnosis not present

## 2020-02-22 DIAGNOSIS — F419 Anxiety disorder, unspecified: Secondary | ICD-10-CM

## 2020-02-22 DIAGNOSIS — E538 Deficiency of other specified B group vitamins: Secondary | ICD-10-CM | POA: Diagnosis not present

## 2020-02-22 DIAGNOSIS — R768 Other specified abnormal immunological findings in serum: Secondary | ICD-10-CM

## 2020-02-22 DIAGNOSIS — I2699 Other pulmonary embolism without acute cor pulmonale: Secondary | ICD-10-CM

## 2020-02-22 NOTE — Assessment & Plan Note (Signed)
Very broad differential.  She is on Celexa which helps which indicates possibly an underlying inflammatory condition.  She is also had elevated in the past.  She has had brain MRI within the last couple of years which was normal and is also seen a neurologist.  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  Will check labs today including CBC, CMET, TSH, CRP, sed rate, rheumatoid factor, ANA, and CK.  Polymyalgia rheumatica is also on the differential.  If labs are negative would maybe consider trial of prednisone.  She also of note has known history of degenerative disc disease and cervical spondylosis.  This was noted on MRI from 5 years ago.  This could be contributing as well.  If above labs are negative would consider reimaging back.

## 2020-02-22 NOTE — Progress Notes (Signed)
   Katlyn Muldrew is a 54 y.o. female who presents today for an office visit.  Assessment/Plan:  Chronic Problems Addressed Today: PTE (pulmonary thromboembolism) (HCC) Anticoagulated on Xarelto.  Anxiety Managed by psychiatry.  Currently on thyroid 40 mg daily, propranolol 80 mg twice daily, and lithium 900 mg daily.  Vitamin B12 deficiency Check B12.  Diffuse pain Very broad differential.  She is on Celexa which helps which indicates possibly an underlying inflammatory condition.  She is also had elevated in the past.  She has had brain MRI within the last couple of years which was normal and is also seen a neurologist.  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  Will check labs today including CBC, CMET, TSH, CRP, sed rate, rheumatoid factor, ANA, and CK.  Polymyalgia rheumatica is also on the differential.  If labs are negative would maybe consider trial of prednisone.  She also of note has known history of degenerative disc disease and cervical spondylosis.  This was noted on MRI from 5 years ago.  This could be contributing as well.  If above labs are negative would consider reimaging back.     Subjective:  HPI:  Patient is here to reestablish care.  She was last seen about 2 years ago.  She had to switch to a different office due to our office no longer being covered under her insurance plan.  She has had persistent diffuse myalgias and body pains over the last several years.  This is her main complaint today.  We had seen her for this a couple years ago.  Had preliminary work-up at that time which showed positive ANA.  Obtain brain MRI which was negative.  Symptoms have persisted and seem to be worsening.  She has also had some more jaw pain, body aches, and subjective fevers or chills.  She gets pins-and-needles sensation in her feet.  Also get some  achiness in her toes.  She has been following with psychiatry for her history of anxiety and depression has had some medication changes made recently.  She will be following up with endocrinologist next month.  She recently saw a neurologist who was not concerned about MS.        Objective:  Physical Exam: BP 106/70   Pulse 71   Temp 97.8 F (36.6 C) (Temporal)   Ht 5\' 8"  (1.727 m)   Wt 236 lb (107 kg)   LMP  (LMP Unknown) Comment: last period ---  1 year ago (?)  SpO2 97%   BMI 35.88 kg/m   Gen: No acute distress, resting comfortably CV: Regular rate and rhythm with no murmurs appreciated Pulm: Normal work of breathing, clear to auscultation bilaterally with no crackles, wheezes, or rhonchi Skin: Erythematous rash with scattered telangiectasias scattered on the face. Neuro: Grossly normal, moves all extremities Psych: Normal affect and thought content  Time Spent: 55 minutes of total time was spent on the date of the encounter performing the following actions: chart review prior to seeing the patient including recent visits with previous PCP and specialist., obtaining history, performing a medically necessary exam, counseling on the treatment plan, placing orders, and documenting in our EHR.        . Katina Degree, MD 02/22/2020 4:55 PM

## 2020-02-22 NOTE — Patient Instructions (Signed)
It was very nice to see you today!  We will check blood work today.  I am concerned that you have inflammatory condition causing her symptoms.  You may need to consider starting prednisone ending on the results.  We may also need to get x-rays of your back depending on the results.  Take care, Dr [name]  Please try these tips to maintain a healthy lifestyle:   Eat at least 3 REAL meals and 1-2 snacks per day.  Aim for no more than 5 hours between eating.  If you eat breakfast, please do so within one hour of getting up.    Each meal should contain half fruits/vegetables, one quarter protein, and one quarter carbs (no bigger than a computer mouse)   Cut down on sweet beverages. This includes juice, soda, and sweet tea.     Drink at least 1 glass of water with each meal and aim for at least 8 glasses per day   Exercise at least 150 minutes every week.

## 2020-02-22 NOTE — Assessment & Plan Note (Addendum)
Managed by psychiatry.  Currently on thyroid 40 mg daily, propranolol 80 mg twice daily, and lithium 900 mg daily.

## 2020-02-22 NOTE — Assessment & Plan Note (Signed)
Anticoagulated on Xarelto.

## 2020-02-22 NOTE — Assessment & Plan Note (Signed)
Check B12 

## 2020-02-23 DIAGNOSIS — F332 Major depressive disorder, recurrent severe without psychotic features: Secondary | ICD-10-CM | POA: Diagnosis not present

## 2020-02-24 LAB — COMPREHENSIVE METABOLIC PANEL
AG Ratio: 2.3 (calc) (ref 1.0–2.5)
ALT: 31 U/L — ABNORMAL HIGH (ref 6–29)
AST: 21 U/L (ref 10–35)
Albumin: 4.9 g/dL (ref 3.6–5.1)
Alkaline phosphatase (APISO): 63 U/L (ref 37–153)
BUN: 13 mg/dL (ref 7–25)
CO2: 25 mmol/L (ref 20–32)
Calcium: 10.6 mg/dL — ABNORMAL HIGH (ref 8.6–10.4)
Chloride: 106 mmol/L (ref 98–110)
Creat: 0.68 mg/dL (ref 0.50–1.05)
Globulin: 2.1 g/dL (calc) (ref 1.9–3.7)
Glucose, Bld: 106 mg/dL — ABNORMAL HIGH (ref 65–99)
Potassium: 4.2 mmol/L (ref 3.5–5.3)
Sodium: 141 mmol/L (ref 135–146)
Total Bilirubin: 0.7 mg/dL (ref 0.2–1.2)
Total Protein: 7 g/dL (ref 6.1–8.1)

## 2020-02-24 LAB — CARDIOLIPIN ANTIBODIES, IGG, IGM, IGA
Anticardiolipin IgA: <2 APL-U/mL
Anticardiolipin IgG: <2 GPL-U/mL
Anticardiolipin IgM: <2 MPL-U/mL

## 2020-02-24 LAB — ANTI-NUCLEAR AB-TITER (ANA TITER): ANA Titer 1: 1:40 {titer} — ABNORMAL HIGH

## 2020-02-24 LAB — CBC
HCT: 43.8 % (ref 35.0–45.0)
Hemoglobin: 15.2 g/dL (ref 11.7–15.5)
MCH: 31.4 pg (ref 27.0–33.0)
MCHC: 34.7 g/dL (ref 32.0–36.0)
MCV: 90.5 fL (ref 80.0–100.0)
MPV: 10.9 fL (ref 7.5–12.5)
Platelets: 346 10*3/uL (ref 140–400)
RBC: 4.84 10*6/uL (ref 3.80–5.10)
RDW: 13.1 % (ref 11.0–15.0)
WBC: 11 10*3/uL — ABNORMAL HIGH (ref 3.8–10.8)

## 2020-02-24 LAB — ANA: Anti Nuclear Antibody (ANA): POSITIVE — AB

## 2020-02-24 LAB — EXTRA BLUE TOP TUBE

## 2020-02-24 LAB — VITAMIN D 25 HYDROXY (VIT D DEFICIENCY, FRACTURES): Vit D, 25-Hydroxy: 54 ng/mL (ref 30–100)

## 2020-02-24 LAB — C-REACTIVE PROTEIN: CRP: 4.7 mg/L (ref <8.0)

## 2020-02-24 LAB — RHEUMATOID FACTOR: Rheumatoid fact SerPl-aCnc: <14 IU/mL (ref <14)

## 2020-02-24 LAB — URIC ACID: Uric Acid, Serum: 5.5 mg/dL (ref 2.5–7.0)

## 2020-02-24 LAB — VITAMIN B12: Vitamin B-12: 337 pg/mL (ref 200–1100)

## 2020-02-24 LAB — CK: Total CK: 59 U/L (ref 29–143)

## 2020-02-24 LAB — SEDIMENTATION RATE: Sed Rate: 6 mm/h (ref 0–30)

## 2020-02-24 LAB — TSH: TSH: 1.84 mIU/L

## 2020-02-25 ENCOUNTER — Encounter: Payer: Self-pay | Admitting: Family Medicine

## 2020-02-26 ENCOUNTER — Telehealth: Payer: Self-pay | Admitting: Psychiatry

## 2020-02-26 ENCOUNTER — Other Ambulatory Visit: Payer: Self-pay | Admitting: *Deleted

## 2020-02-26 DIAGNOSIS — M549 Dorsalgia, unspecified: Secondary | ICD-10-CM

## 2020-02-26 NOTE — Telephone Encounter (Signed)
Methylphendate is giving her bad sideeffects dizziness, and heart racing. Didn't take it to day because of the side effects. She wants to know if she needs to go to urgent care. She called this morning and still has not heard back from anyone. Please call her and tell her what to do at [phone].

## 2020-02-26 NOTE — Progress Notes (Signed)
Please inform patient of the following:  Blood work all relatively stable compared to her previous blood panels.  As we discussed at her office visit I am concerned that some of her symptoms are probably coming from her back. I would like to get xrays of her c spine, t spine, and l spine if she is agreeable.  Katina Degree. [name], MD 02/26/2020 8:30 AM

## 2020-02-26 NOTE — Telephone Encounter (Signed)
More SE with concerta.  Can't stay asleep, HA, ear pain, "I feel horrible".  Took concerta yesterday and not today.  BP ok at PCP and pulse was OK also.    Doesn't not feel better today after missing it today.   If not bettter tomorrow then cut modafinil in 1/2 thereafter.

## 2020-02-26 NOTE — Addendum Note (Signed)
Addended by: Ardith Dark on: 02/26/2020 11:41 AM   Modules accepted: Orders

## 2020-02-26 NOTE — Telephone Encounter (Signed)
Pt left message that she wants to stop taking the lowered dose of Methlyphenidate. Pt said that she is experiencing sever headaches, and earaches. Pt said that she has zero tolerance for noise. Pt also said that she thinks she is becoming alittle manic. Please call.

## 2020-02-26 NOTE — Telephone Encounter (Signed)
Lab results given   Patient agree with Xray Stated unsure of prednisone since she is getting very irritated at work with constant HA, Pain scale # 7-8 worsen with noise. Requesting referral to chiropractic for numbness on left elbow. Pt allergic to tramadol, itching

## 2020-02-28 ENCOUNTER — Other Ambulatory Visit: Payer: Self-pay | Admitting: Family Medicine

## 2020-02-29 ENCOUNTER — Encounter: Payer: Self-pay | Admitting: Family Medicine

## 2020-02-29 ENCOUNTER — Other Ambulatory Visit: Payer: Self-pay | Admitting: *Deleted

## 2020-02-29 ENCOUNTER — Telehealth (INDEPENDENT_AMBULATORY_CARE_PROVIDER_SITE_OTHER): Payer: BC Managed Care – PPO | Admitting: Family Medicine

## 2020-02-29 VITALS — Ht 68.0 in

## 2020-02-29 DIAGNOSIS — M5010 Cervical disc disorder with radiculopathy, unspecified cervical region: Secondary | ICD-10-CM

## 2020-02-29 DIAGNOSIS — R52 Pain, unspecified: Secondary | ICD-10-CM

## 2020-02-29 DIAGNOSIS — M25522 Pain in left elbow: Secondary | ICD-10-CM

## 2020-02-29 DIAGNOSIS — F319 Bipolar disorder, unspecified: Secondary | ICD-10-CM | POA: Diagnosis not present

## 2020-02-29 MED ORDER — BACLOFEN 10 MG PO TABS: 10.0000 mg | ORAL_TABLET | Freq: Three times a day (TID) | ORAL | 0 refills | Status: DC

## 2020-02-29 NOTE — Assessment & Plan Note (Signed)
Mood has worsened since our last visit.  She is on lithium and Viibryd and follows with psychiatry.

## 2020-02-29 NOTE — Assessment & Plan Note (Addendum)
Recent labs all within stable ranges.  Discussed possibility for polymyalgia rheumatica however she is extremely reluctant to try prednisone for short period time due to concern for worsening her mood.  She also likely has significant degenerative disc disease and spondylosis which is likely contributing.  She will be following up with orthopedic soon for this.  She has not tolerated tramadol in the past.  Would consider trial of Nucynta in the future.

## 2020-02-29 NOTE — Telephone Encounter (Signed)
Referral placed, Patient has appointment today

## 2020-02-29 NOTE — Assessment & Plan Note (Signed)
Likely contributing to her headache and decreased pain.  She likely has other areas of degenerative disc disease and spondylosis.  She is already on Celebrex 200 mg twice daily.  She will follow up with orthopedic soon.

## 2020-02-29 NOTE — Progress Notes (Signed)
Deborah Soto is a 54 y.o. female who presents today for a virtual office visit.  Assessment/Plan:  New/Acute Problems: Headache History consistent with migraine though patient has no history of migraine disorder.  Potentially could be cervicogenic.  She is already on Celebrex twice daily.  Discussed prednisone-see below she is adamantly against this for the time being.  We will start baclofen.  Given her new onset migraine with some associated neurologic symptoms such as tinnitus recommended we proceed with head CT scan.  She deferred this for today would like to get early next year.  She will check in with me later this week to let me know how symptoms are progressing.  Chronic Problems Addressed Today: Diffuse pain Recent labs all within stable ranges.  Discussed possibility for polymyalgia rheumatica however she is extremely reluctant to try prednisone for short period time due to concern for worsening her mood.  She also likely has significant degenerative disc disease and spondylosis which is likely contributing.  She will be following up with orthopedic soon for this.  She has not tolerated tramadol in the past.  Would consider trial of Nucynta in the future.  Cervical disc disorder with radiculopathy of cervical region Likely contributing to her headache and decreased pain.  She likely has other areas of degenerative disc disease and spondylosis.  She is already on Celebrex 200 mg twice daily.  She will follow up with orthopedic soon.  Bipolar affective disorder (HCC) Mood has worsened since our last visit.  She is on lithium and Viibryd and follows with psychiatry.     Subjective:  HPI:  Patient here for follow-up.  Was seen about a week ago.  She is having diffuse pain and myalgias at that time.  We check labs which were essentially unchanged from previous.  She has mildly positive ANA.  Symptoms have worsened since her last visit.  She has had worsening headache.  This is  associated with diffuse body aches and chills.  She has not clear she has had a fever.as she has not been taking her temperature.  She has had worsening ringing in bilateral ears.  She has very sensitive to noises.  No new weakness or numbness.  She sometimes will have decreased and blurred vision throughout the day but no sudden changes.  Some nausea.  No vomiting.  No loss of consciousness.  She has never had a migraine or headache like this in the past.  She has been on several medications in the past which have not been effective.  She does not want to start prednisone because she is afraid of what it may due to her mood and mental health.  She is currently seeing a psychiatrist and is on Viibryd and lithium.  She has recently stopped taking methylphenidate but has not noticed any change in her symptoms.       Objective/Observations  Physical Exam: Gen: NAD, resting comfortably Pulm: Normal work of breathing Neuro: Grossly normal, moves all extremities Psych: Normal affect and thought content  Virtual Visit via Video   I connected with [name] on 02/29/20 at  4:00 PM EST by a video enabled telemedicine application and verified that I am speaking with the correct person using two identifiers. The limitations of evaluation and management by telemedicine and the availability of in person appointments were discussed. The patient expressed understanding and agreed to proceed.   Patient location: Home Provider location:  [place] [employer] Persons participating in the virtual visit: Myself  and Patient  Time Spent: 45 minutes of total time was spent on the date of the encounter performing the following actions: chart review prior to seeing the patient, obtaining history, performing a medically necessary exam, counseling on the treatment plan including need for imaging, placing orders, and documenting in our EHR.       Katina Degree. [name], MD 02/29/2020 4:08 PM

## 2020-03-01 ENCOUNTER — Telehealth: Payer: Self-pay

## 2020-03-01 NOTE — Telephone Encounter (Signed)
Pt is requesting a flu test before her family comes in for christmas.

## 2020-03-01 NOTE — Telephone Encounter (Signed)
See most recent message

## 2020-03-01 NOTE — Telephone Encounter (Signed)
See most recent phone message 12/17

## 2020-03-02 ENCOUNTER — Encounter: Payer: Self-pay | Admitting: Family Medicine

## 2020-03-02 ENCOUNTER — Telehealth: Payer: Self-pay | Admitting: Psychiatry

## 2020-03-02 MED ORDER — VIIBRYD 40 MG PO TABS: 60.0000 mg | ORAL_TABLET | Freq: Every day | ORAL | 1 refills | Status: DC

## 2020-03-02 NOTE — Telephone Encounter (Signed)
That is ok.  Katina Degree. [name], MD 03/02/2020 10:27 AM

## 2020-03-02 NOTE — Telephone Encounter (Signed)
Please advise 

## 2020-03-02 NOTE — Telephone Encounter (Signed)
Pt called and left a message stating that she still feels horrible. Has cut the modafinil in half and still has a headache and ear ringing. She has stopped taking it all together. Her job wants her to take a covid test and she is in jeopardy of losing her job. She was crying and said that she doesn't even care if she lost her job because she feels so bad. Please call her at [phone]

## 2020-03-02 NOTE — Telephone Encounter (Signed)
RTC Off Concerta and Modafinil.  Nothing better and nothing worse.    Still having HA, ear ringing, jaw pain seems different.  Tinnitus started in November or so.  Dr. [name] gave her baclofen recently.  Viibryd not helping depression but lots of other non-medical things.  Boss lied to her and that's upsetting whether on meds or not.  Option low dose amitriptyline for HA.  Increase Viibryd to 60 mg daily for depression.    [name], MD, [name]

## 2020-03-03 ENCOUNTER — Encounter: Payer: Self-pay | Admitting: Family Medicine

## 2020-03-03 NOTE — Telephone Encounter (Signed)
Ok to schedule flu test.

## 2020-03-06 ENCOUNTER — Encounter (HOSPITAL_COMMUNITY): Payer: Self-pay | Admitting: Physician Assistant

## 2020-03-06 ENCOUNTER — Ambulatory Visit (HOSPITAL_COMMUNITY)
Admission: EM | Admit: 2020-03-06 | Discharge: 2020-03-06 | Disposition: A | Discharge status: Home / Self Care | Payer: BC Managed Care – PPO

## 2020-03-06 ENCOUNTER — Other Ambulatory Visit: Payer: Self-pay

## 2020-03-06 DIAGNOSIS — M545 Low back pain, unspecified: Secondary | ICD-10-CM | POA: Diagnosis not present

## 2020-03-06 DIAGNOSIS — M79605 Pain in left leg: Secondary | ICD-10-CM

## 2020-03-06 DIAGNOSIS — G8929 Other chronic pain: Secondary | ICD-10-CM | POA: Diagnosis not present

## 2020-03-06 DIAGNOSIS — M79604 Pain in right leg: Secondary | ICD-10-CM

## 2020-03-06 NOTE — ED Provider Notes (Addendum)
[facility]    CSN: [id number] Arrival date & time: 03/06/20  1554      History   Chief Complaint Chief Complaint  Patient presents with  . Headache  . Back Pain  . Foot Pain    HPI Deborah Soto is a 54 y.o. female.   Patient here c/w back and foot pain x several years.  PMH include DM, severe depression, polypharmacy, and PE, on Xarelto for PE prophylaxsis.  She states she has seen rheumatology and had several positive tests for autoimmune disease, has seen neurologist, and sees PCP for her pain.  She was taking 1200 mg gabapentin TID, which was discontinued in 2018, 300 mg amitriptyline QHS which was discontinued 10/2019.  She is currently taking 10 mg baclofen TID and Celebrix 200 mg BID, which she state are not helping.  She reports A1C around 7 - 8. She notes pain in foot is burning pain, starts at feet and radiates to legs.   Pain in back is b/l mid thoracic and does not radiate.     Past Medical History:  Diagnosis Date  . Anxiety   . Asthma   . Depression   . Diabetes mellitus type 2 in obese (HCC) 06/11/2016  . Embolism - blood clot January 1997 & January 2017   Also had LLE DVT in 1997  . Hypertension   . Preeclampsia 10/10/2011   1999   . Sleep apnea     Patient Active Problem List   Diagnosis Date Noted  . Rash 07/28/2020  . Vitamin D deficiency 05/03/2020  . Neuropathy 04/06/2020  . S/P ECT (electroconvulsive therapy) 06/02/2019  . Memory loss due to medical condition 06/02/2019  . Intention tremor 06/02/2019  . 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 OSA on CPAP 01/26/2019  . Long term use of drug 10/08/2018  . Insomnia 10/08/2018  . Anxiety 10/08/2018  . Tremors of nervous system 10/08/2018  . Acne 01/10/2018  . Primary hypercoagulable state (HCC) 10/10/2017  . Vitamin B12 deficiency 03/18/2017  . Diffuse pain 02/22/2017  . Morbid obesity (HCC) 06/22/2016  . 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 Intentional drug overdose (HCC)   . Diabetes mellitus type 2 in obese (HCC) 06/11/2016  . Positive ANA (antinuclear antibody) 04/02/2016  . Hyperparathyroidism (HCC) 01/09/2016  . Snoring 09/20/2015  . Cervical disc disorder with radiculopathy of cervical region 05/12/2015  . Hepatic steatosis 04/18/2015  . Achilles tendonitis 04/08/2012  . PTE (pulmonary thromboembolism) (HCC) 10/10/2011  . Affective disorder (HCC) 10/10/2011  . AR (allergic rhinitis) 10/10/2011    Past Surgical History:  Procedure Laterality Date  . KIDNEY STONE SURGERY      OB History   No obstetric history on file.      Home Medications    Prior to Admission medications   Medication Sig Start Date End Date Taking? Authorizing Provider  amitriptyline (ELAVIL) 50 MG tablet Take 1 tablet (50 mg total) by mouth at bedtime. 05/16/20   Cottle, Steva Ready., MD  celecoxib (CELEBREX) 200 MG capsule TAKE 1 CAPSULE TWICE DAILY FOR CHRONIC PAIN. 02/29/20   Ardith Dark, MD  cyanocobalamin (,VITAMIN B-12,) 1000 MCG/ML injection One Injection weekly for 4 weeks then one injection monthly for 3 month 07/20/20   Ardith Dark, MD  gabapentin (NEURONTIN) 300 MG capsule Take 2 capsules in the morning, 1 in the middle of the day, and 2 at  night. Patient taking differently: 4 capsules daily. 05/09/20   Ardith Dark, MD  JARDIANCE 25 MG TABS tablet TAKE ONE TABLET BY MOUTH DAILY 07/18/20   Ardith Dark, MD  lithium carbonate (LITHOBID) 300 MG CR tablet Take 3 tablets (900 mg total) by mouth at bedtime. 04/22/20   Cottle, Steva Ready., MD  OLANZapine (ZYPREXA) 10 MG tablet TAKE ONE TABLET BY MOUTH AT BEDTIME 07/27/20   Cottle, Steva Ready., MD  pantoprazole (PROTONIX) 40 MG tablet TAKE 1 TABLET ONCE DAILY. 07/21/18   Ardith Dark, MD  propranolol (INDERAL) 40 MG tablet take ONE TO TWO tablets every EIGHT hours  FOR anxiety 06/09/20   Cottle,  Steva Ready., MD  rivaroxaban (XARELTO) 20 MG TABS tablet Take 1 tablet (20 mg total) by mouth daily with supper. 07/07/20   Ardith Dark, MD  Semaglutide, 1 MG/DOSE, (OZEMPIC, 1 MG/DOSE,) 4 MG/3ML SOPN Inject 2 mg into the skin once a week. 06/23/20   Ardith Dark, MD  tretinoin (RETIN-A) 0.025 % cream Apply 1 application topically at bedtime. 11/09/19   [provider]  Vilazodone HCl (VIIBRYD) 40 MG TABS Take 1.5 tablets (60 mg total) by mouth daily. 07/27/20   Cottle, Steva Ready., MD    Family History Family History  Problem Relation Age of Onset  . Dementia Father   . Heart disease Father   . Clotting disorder Mother   . Arthritis Mother   . Clotting disorder Sister   . Clotting disorder Maternal Grandmother   . Clotting disorder Maternal Aunt   . Rheumatologic disease Neg Hx   . Hyperparathyroidism Neg Hx     Social History Social History   Tobacco Use  . Smoking status: Former Smoker    Packs/day: 1.00    Years: 20.00    Pack years: 20.00    Quit date: 03/12/2004    Years since quitting: 16.4  . Smokeless tobacco: Never Used  Substance Use Topics  . Alcohol use: Yes    Alcohol/week: 1.0 standard drink    Types: 1 Cans of beer per week    Comment: 1-2 times a week  . Drug use: No     Allergies   Tramadol   Review of Systems Review of Systems  Constitutional: Negative for chills, fatigue and fever.  Eyes: Negative for pain and redness.  Respiratory: Negative for cough, shortness of breath and wheezing.   Cardiovascular: Negative for chest pain, palpitations and leg swelling.  Gastrointestinal: Negative for abdominal pain, diarrhea, nausea and vomiting.  Musculoskeletal: Positive for back pain and myalgias. Negative for arthralgias, joint swelling, neck pain and neck stiffness.  Skin: Negative for rash.  Neurological: Positive for numbness. Negative for weakness, light-headedness and headaches.  Hematological: Negative for adenopathy. Does not  bruise/bleed easily.  Psychiatric/Behavioral: Negative for confusion and sleep disturbance.     Physical Exam Triage Vital Signs ED Triage Vitals  Enc Vitals Group     BP 03/06/20 1651 (!) 128/91     Pulse Rate 03/06/20 1651 82     Resp 03/06/20 1651 17     Temp 03/06/20 1651 97.7 F (36.5 C)     Temp Source 03/06/20 1651 Oral     SpO2 03/06/20 1651 97 %     Weight --      Height --      Head Circumference --      Peak Flow --      Pain Score 03/06/20 1650 7  Pain Loc --      Pain Edu? --      Excl. in GC? --    No data found.  Updated Vital Signs BP (!) 128/91 (BP Location: Right Arm)   Pulse 82   Temp 97.7 F (36.5 C) (Oral)   Resp 17   LMP  (LMP Unknown) Comment: last period ---  1 year ago (?)  SpO2 97%   Visual Acuity Right Eye Distance:   Left Eye Distance:   Bilateral Distance:    Right Eye Near:   Left Eye Near:    Bilateral Near:     Physical Exam Vitals and nursing note reviewed.  Constitutional:      General: She is not in acute distress.    Appearance: She is well-developed and well-nourished.  HENT:     Head: Normocephalic and atraumatic.     Nose: Nose normal.  Eyes:     General: No scleral icterus.    Extraocular Movements: Extraocular movements intact.     Conjunctiva/sclera: Conjunctivae normal.  Pulmonary:     Effort: Pulmonary effort is normal. No respiratory distress.     Breath sounds: Normal breath sounds.  Musculoskeletal:        General: No swelling, deformity or edema. Normal range of motion.     Cervical back: Neck supple.     Thoracic back: Spasms and tenderness present.  Skin:    General: Skin is warm and dry.     Capillary Refill: Capillary refill takes less than 2 seconds.  Neurological:     General: No focal deficit present.     Mental Status: She is alert.     Cranial Nerves: No cranial nerve deficit.     Motor: No weakness.     Gait: Gait normal.  Psychiatric:        Mood and Affect: Mood and affect normal.         Behavior: Behavior normal.      UC Treatments / Results  Labs (all labs ordered are listed, but only abnormal results are displayed) Labs Reviewed - No data to display  EKG   Radiology No results found.  Procedures Procedures (including critical care time)  Medications Ordered in UC Medications - No data to display  Initial Impression / Assessment and Plan / UC Course  I have reviewed the triage vital signs and the nursing notes.  Pertinent labs & imaging results that were available during my care of the patient were reviewed by me and considered in my medical decision making (see chart for details).     Not a candidate for NSAIDS due to Xarelto use Not a candidate for prednisone due to history of anxiety/depression Patient was on high dose gabapentin and amitriptyline, both discontinued by PCP Patient to follow up with PCP / Rheumatology / Neurology. Patient agrees with plan of care. ED precautions provided.   Final Clinical Impressions(s) / UC Diagnoses   Final diagnoses:  Chronic bilateral low back pain without sciatica  Pain in both lower extremities     Discharge Instructions     Follow up with PCP for further management of chronic pain.    ED Prescriptions    None     PDMP not reviewed this encounter.   Evern Core, PA-C 03/07/20 0830    Evern Core, PA-C 08/06/20 1012

## 2020-03-06 NOTE — Discharge Instructions (Addendum)
Follow up with PCP for further management of chronic pain.

## 2020-03-06 NOTE — ED Triage Notes (Signed)
Pt presents with recurrent headache and chronic back pain & foot pain.  Pt has complaints of multiple places of her body where she has chronic intermittent pain over a long duration of time.

## 2020-03-07 DIAGNOSIS — M25522 Pain in left elbow: Secondary | ICD-10-CM | POA: Diagnosis not present

## 2020-03-07 DIAGNOSIS — M542 Cervicalgia: Secondary | ICD-10-CM | POA: Diagnosis not present

## 2020-03-08 ENCOUNTER — Telehealth: Payer: Self-pay | Admitting: Psychiatry

## 2020-03-08 ENCOUNTER — Other Ambulatory Visit: Payer: Self-pay | Admitting: *Deleted

## 2020-03-08 ENCOUNTER — Telehealth: Payer: Self-pay

## 2020-03-08 ENCOUNTER — Other Ambulatory Visit: Payer: Self-pay | Admitting: Psychiatry

## 2020-03-08 DIAGNOSIS — F332 Major depressive disorder, recurrent severe without psychotic features: Secondary | ICD-10-CM | POA: Diagnosis not present

## 2020-03-08 MED ORDER — HYDROXYZINE HCL 25 MG PO TABS: 25.0000 mg | ORAL_TABLET | Freq: Three times a day (TID) | ORAL | 0 refills | Status: DC | PRN

## 2020-03-08 MED ORDER — PREDNISONE 20 MG PO TABS
20.0000 mg | ORAL_TABLET | Freq: Every day | ORAL | 0 refills | Status: DC
Start: 2020-03-08 — End: 2020-04-06

## 2020-03-08 NOTE — Telephone Encounter (Signed)
Please advise 

## 2020-03-08 NOTE — Telephone Encounter (Signed)
Patient stated she was still in a lot of pain and wanted to see if the imaging that Dr. [name] and her discussed could still be done and if she should take the prednisone now that its not getting any better.

## 2020-03-08 NOTE — Telephone Encounter (Signed)
Looks like orders for xrays have already been placed.   Ok to send in prednisone 20mg  daily for 2 weeks.  . Katina Degree, MD 03/08/2020 3:43 PM

## 2020-03-08 NOTE — Telephone Encounter (Signed)
Pt called and said that she is still having panic attacks and heart racing and she would like to know if she can take hydroxine  to help with that. Please give her a call at [phone].

## 2020-03-08 NOTE — Telephone Encounter (Signed)
Left message to return call to our office at their convenience.   Rx send to pharmacy

## 2020-03-08 NOTE — Telephone Encounter (Signed)
Please let her know I sent in RX for hydroxyzine as she requsted for anxiety .

## 2020-03-09 ENCOUNTER — Encounter: Payer: BLUE CROSS/BLUE SHIELD | Admitting: Family Medicine

## 2020-03-09 NOTE — Telephone Encounter (Signed)
Left message that prescription was called in to pharmacy

## 2020-03-12 DIAGNOSIS — M79602 Pain in left arm: Secondary | ICD-10-CM | POA: Diagnosis not present

## 2020-03-12 DIAGNOSIS — M25511 Pain in right shoulder: Secondary | ICD-10-CM | POA: Diagnosis not present

## 2020-03-13 ENCOUNTER — Other Ambulatory Visit: Payer: Self-pay | Admitting: Family Medicine

## 2020-03-14 ENCOUNTER — Other Ambulatory Visit: Payer: Self-pay

## 2020-03-14 ENCOUNTER — Telehealth: Payer: Self-pay

## 2020-03-14 ENCOUNTER — Ambulatory Visit (INDEPENDENT_AMBULATORY_CARE_PROVIDER_SITE_OTHER)
Admission: RE | Admit: 2020-03-14 | Discharge: 2020-03-14 | Disposition: A | Discharge status: Home / Self Care | Payer: BC Managed Care – PPO | Source: Ambulatory Visit | Attending: Family Medicine | Admitting: Family Medicine

## 2020-03-14 DIAGNOSIS — M546 Pain in thoracic spine: Secondary | ICD-10-CM | POA: Diagnosis not present

## 2020-03-14 DIAGNOSIS — M5031 Other cervical disc degeneration,  high cervical region: Secondary | ICD-10-CM | POA: Diagnosis not present

## 2020-03-14 DIAGNOSIS — M50322 Other cervical disc degeneration at C5-C6 level: Secondary | ICD-10-CM | POA: Diagnosis not present

## 2020-03-14 DIAGNOSIS — M549 Dorsalgia, unspecified: Secondary | ICD-10-CM

## 2020-03-14 DIAGNOSIS — M545 Low back pain, unspecified: Secondary | ICD-10-CM | POA: Diagnosis not present

## 2020-03-14 DIAGNOSIS — G8929 Other chronic pain: Secondary | ICD-10-CM | POA: Diagnosis not present

## 2020-03-14 NOTE — Telephone Encounter (Signed)
Nurse Assessment Nurse: Annye English RN, Angelique Blonder Date/Time (Eastern Time): 03/14/2020 10:17:39 AM Confirm and document reason for call. If symptomatic, describe symptoms. ---Caller is having fluctuations in her blood glucose; it has been as low as 100 and as high as 500 (according to the doctor's office). Her sx include feet pain, headache, and blurry vision. BG is 251 now and she takes Glipizide, [place]. Does the patient have any new or worsening symptoms? ---Yes Will a triage be completed? ---Yes Related visit to physician within the last 2 weeks? ---Yes Does the PT have any chronic conditions? (i.e. diabetes, asthma, this includes High risk factors for pregnancy, etc.) ---Yes List chronic conditions. ---NIDDM Is the patient pregnant or possibly pregnant? (Ask all females between the ages of 64-55) ---No Is this a behavioral health or substance abuse call? ---No Guidelines Guideline Title Affirmed Question Affirmed Notes Nurse Date/Time (Eastern Time) Diabetes - High Blood Sugar [1] Blood glucose 240 - 300 mg/dL (32.3 - 55.7 mmol/L) AND [2] does not use insulin (e.g., not insulin-dependent; most people with type 2 diabetes) Carmon, RN, Angelique Blonder 03/14/2020 10:19:12 AM PLEASE NOTE: All timestamps contained within this report are represented as [place] Standard Time. CONFIDENTIALTY NOTICE: This fax transmission is intended only for the addressee. It contains information that is legally privileged, confidential or otherwise protected from use or disclosure. If you are not the intended recipient, you are strictly prohibited from reviewing, disclosing, copying using or disseminating any of this information or taking any action in reliance on or regarding this information. If you have received this fax in error, please notify us immediately by telephone so that we can arrange for its return to [name]. Phone: [phone], Toll-Free: [phone], Fax: [phone] Page: 2 of 2 Call Id: [id number] Disp.  Time Lamount Cohen Time) Disposition Final User 03/14/2020 10:13:57 AM Send to Urgent Tessa Lerner 03/14/2020 10:27:18 AM Home Care Yes Carmon, RN, Leighton Ruff Disagree/Comply Comply Caller Understands Yes PreDisposition Call Doctor Care Advice Given Per Guideline HOME CARE: * You should be able to treat this at home. CONTINUE DIABETES PILLS: * Continue taking your diabetes pills. * Try to drink 6 to 8 glasses of water each day. CALL BACK IF: * Vomiting lasting over 4 hours or unable to drink any fluids * Blood glucose over 300 mg/dL (46.2 mmol/L), two or more times in a row. * You have more questions * You become worse CARE ADVICE given per Diabetes - High Blood Sugar (Adult) guideline. HIGH BLOOD SUGAR (HYPERGLYCEMIA): * Contributing factors: Not taking medicines as prescribed, eating a high calorie or high sugar diet, taking steroid medicines, and infection. TREATMENT - LIQUIDS: * Drink at least one glass (8 oz; 240 ml) of water per hour for the next 4 hours. Reason: Adequate hydration will help lower blood sugar. MEASURE AND RECORD YOUR BLOOD GLUCOSE: * Measure your blood glucose before breakfast and before going to bed. * Keep a log and show it to your doctor (or NP/PA) at your next office visit. Comments User: Greggory Stallion, RN Date/Time Lamount Cohen Time): 03/14/2020 10:26:44 AM Pt has appt w/endocrinologist on Wed. Advised to keep appt and cb if BG > 300 x 2 or more or if BG 400-500

## 2020-03-14 NOTE — Telephone Encounter (Signed)
LAST APPOINTMENT DATE: 02/22/2020   NEXT APPOINTMENT DATE: 03/14/2020    LAST REFILL: 07/21/2018  QTY: 30

## 2020-03-15 DIAGNOSIS — F332 Major depressive disorder, recurrent severe without psychotic features: Secondary | ICD-10-CM | POA: Diagnosis not present

## 2020-03-15 NOTE — Telephone Encounter (Signed)
See note

## 2020-03-15 NOTE — Progress Notes (Signed)
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 She will probably need an MRI to make sure she is not having any nerve impingement. I think this could explain a lot of the pain symptoms she has been having. Recommend referral to sports medicine or orthopedics for further evaluation.  Deborah Soto. [name], MD 03/15/2020 11:22 AM

## 2020-03-16 ENCOUNTER — Other Ambulatory Visit: Payer: Self-pay

## 2020-03-16 ENCOUNTER — Ambulatory Visit: Payer: BC Managed Care – PPO | Admitting: Family Medicine

## 2020-03-16 ENCOUNTER — Telehealth: Payer: Self-pay

## 2020-03-16 ENCOUNTER — Ambulatory Visit: Payer: Self-pay

## 2020-03-16 ENCOUNTER — Other Ambulatory Visit: Payer: Self-pay | Admitting: *Deleted

## 2020-03-16 VITALS — BP 100/70 | HR 69 | Ht 68.0 in | Wt 242.0 lb

## 2020-03-16 DIAGNOSIS — M5412 Radiculopathy, cervical region: Secondary | ICD-10-CM

## 2020-03-16 DIAGNOSIS — M549 Dorsalgia, unspecified: Secondary | ICD-10-CM

## 2020-03-16 DIAGNOSIS — M25511 Pain in right shoulder: Secondary | ICD-10-CM

## 2020-03-16 MED ORDER — GABAPENTIN 300 MG PO CAPS: 300.0000 mg | ORAL_CAPSULE | Freq: Three times a day (TID) | ORAL | 3 refills | Status: DC | PRN

## 2020-03-16 MED ORDER — TIZANIDINE HCL 4 MG PO TABS: 4.0000 mg | ORAL_TABLET | Freq: Four times a day (QID) | ORAL | 1 refills | Status: DC | PRN

## 2020-03-16 NOTE — Telephone Encounter (Signed)
Patient called in stating that her blood sugar is really high and had to cancel her appointment last December for her Endocrinologist due to being exposed to COVID, next appointment is on 1/20. Deborah Soto is really concerned, was advised to go to ED in the past but she states the ED isnt really taking patients at the moment unless its very important.

## 2020-03-16 NOTE — Progress Notes (Signed)
Note duplication 

## 2020-03-16 NOTE — Patient Instructions (Addendum)
Thank you for coming in today.  Continue PT.   Try the gabapentin.   Try the tizanidine.   If not rapidly improving let me know. Next step is MRI.   I will write a letter to address ergonomics at work.    Cervical Radiculopathy  Cervical radiculopathy means that a nerve in the neck (a cervical nerve) is pinched or bruised. This can happen because of an injury to the cervical spine (vertebrae) in the neck, or as a normal part of getting older. This can cause pain or loss of feeling (numbness) that runs from your neck all the way down to your arm and fingers. Often, this condition gets better with rest. Treatment may be needed if the condition does not get better. What are the causes?  A neck injury.  A bulging disk in your spine.  Muscle movements that you cannot control (muscle spasms).  Tight muscles in your neck due to overuse.  Arthritis.  Breakdown in the bones and joints of the spine (spondylosis) due to getting older.  Bone spurs that form near the nerves in the neck. What are the signs or symptoms?  Pain. The pain may: ? Run from the neck to the arm and hand. ? Be very bad or irritating. ? Be worse when you move your neck.  Loss of feeling or tingling in your arm or hand.  Weakness in your arm or hand, in very bad cases. How is this treated? In many cases, treatment is not needed for this condition. With rest, the condition often gets better over time. If treatment is needed, options may include:  Wearing a soft neck collar (cervical collar) for short periods of time, as told by your doctor.  Doing exercises (physical therapy) to strengthen your neck muscles.  Taking medicines.  Having shots (injections) in your spine, in very bad cases.  Having surgery. This may be needed if other treatments do not help. The type of surgery that is used depends on the cause of your condition. Follow these instructions at home: If you have a soft neck collar:  Wear it as  told by your doctor. Remove it only as told by your doctor.  Ask your doctor if you can remove the collar for cleaning and bathing. If you are allowed to remove the collar for cleaning or bathing: ? Follow instructions from your doctor about how to remove the collar safely. ? Clean the collar by wiping it with mild soap and water and drying it completely. ? Take out any removable pads in the collar every 1-2 days. Wash them by hand with soap and water. Let them air-dry completely before you put them back in the collar. ? Check your skin under the collar for redness or sores. If you see any, tell your doctor. Managing pain      Take over-the-counter and prescription medicines only as told by your doctor.  If told, put ice on the painful area. ? If you have a soft neck collar, remove it as told by your doctor. ? Put ice in a plastic bag. ? Place a towel between your skin and the bag. ? Leave the ice on for 20 minutes, 2-3 times a day.  If using ice does not help, you can try using heat. Use the heat source that your doctor recommends, such as a moist heat pack or a heating pad. ? Place a towel between your skin and the heat source. ? Leave the heat on for 20-30  minutes. ? Remove the heat if your skin turns bright red. This is very important if you are unable to feel pain, heat, or cold. You may have a greater risk of getting burned.  You may try a gentle neck and shoulder rub (massage). Activity  Rest as needed.  Return to your normal activities as told by your doctor. Ask your doctor what activities are safe for you.  Do exercises as told by your doctor or physical therapist.  Do not lift anything that is heavier than 10 lb (4.5 kg) until your doctor tells you that it is safe. General instructions  Use a flat pillow when you sleep.  Do not drive while wearing a soft neck collar. If you do not have a soft neck collar, ask your doctor if it is safe to drive while your neck  heals.  Ask your doctor if the medicine prescribed to you requires you to avoid driving or using heavy machinery.  Do not use any products that contain nicotine or tobacco, such as cigarettes, e-cigarettes, and chewing tobacco. These can delay healing. If you need help quitting, ask your doctor.  Keep all follow-up visits as told by your doctor. This is important. Contact a doctor if:  Your condition does not get better with treatment. Get help right away if:  Your pain gets worse and is not helped with medicine.  You lose feeling or feel weak in your hand, arm, face, or leg.  You have a high fever.  You have a stiff neck.  You cannot control when you poop or pee (have incontinence).  You have trouble with walking, balance, or talking. Summary  Cervical radiculopathy means that a nerve in the neck is pinched or bruised.  A nerve can get pinched from a bulging disk, arthritis, an injury to the neck, or other causes.  Symptoms include pain, tingling, or loss of feeling that goes from the neck into the arm or hand.  Weakness in your arm or hand can happen in very bad cases.  Treatment may include resting, wearing a soft neck collar, and doing exercises. You might need to take medicines for pain. In very bad cases, shots or surgery may be needed. This information is not intended to replace advice given to you by your health care provider. Make sure you discuss any questions you have with your health care provider. Document Revised: 01/17/2018 Document Reviewed: 01/17/2018 Elsevier Patient Education  2020 [employer].

## 2020-03-16 NOTE — Telephone Encounter (Signed)
See below. Does she need to schedule with you until she sees Endo?

## 2020-03-16 NOTE — Progress Notes (Signed)
I, Philbert Riser, LAT, ATC acting as a scribe for [name], MD.  Subjective:    CC: Right shoulder pain  HPI: Pt is a 55yo female c/o R shoulder pain that's been ongoing for a month. 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 Pt locates pain to posterior aspects, around scapula, and shoots up to neck and down to hand along the ulnar aspect.  She notes that she has been forced to use a new desk set up at work which she thinks is causing her pain.  She has to sit with her arms in elevated position and her neck twisted to the side.  She notes this immediately made her neck started hurting and her arm started bothering her.  Pt suffers from chronic pain. R-hand dominate.  UE numbness/tingling: yes- ulnar aspect UE weakness: yes- drops objects constantly Aggravates:  Rx tried: heat, ice, celebrex,   Pertinent review of Systems: No fevers or chills  Relevant historical information: Previous history cervical radiculopathy treated with epidural steroid injection by Dr. Maurice Small by [facility].   Objective:    Vitals:   03/16/20 1351  BP: 100/70  Pulse: 69  SpO2: 96%   General: Well Developed, well nourished, and in no acute distress.   MSK: C-spine normal-appearing nontender midline.  Decreased cervical motion.  Flexion quite limited.  Extension intact rotation and lateral flexion limited. Upper extremity reflexes and sensation are equal normal throughout bilaterally. Upper semistrength diminished right biceps 4/5 normal otherwise bilaterally. Pulses capillary refill are intact distally.  Lab and Radiology Results No results found for this or any previous visit (from the past 72 hour(s)). DG Cervical Spine Complete  Result Date: 03/14/2020 CLINICAL DATA:  Progressive chronic pain EXAM: CERVICAL SPINE - COMPLETE 4+ VIEW COMPARISON:  04/28/2015 FINDINGS: Straightening of the normal cervical  lordosis. Mild narrowing of interspaces C3-4, C4-5, C5-6. Small anterior endplate spurs C5-6. No prevertebral soft tissue swelling. Negative for fracture. Dental restorations. IMPRESSION: 1. Negative for fracture or other acute bone abnormality. 2. Degenerative disc disease C3-C6. Electronically Signed   By: Corlis Leak M.D.   On: 03/14/2020 16:46   DG Thoracic Spine W/Swimmers  Result Date: 03/14/2020 CLINICAL DATA:  Worsening chronic pain EXAM: THORACIC SPINE - 3 VIEWS COMPARISON:  06/11/2016 FINDINGS: There is no evidence of thoracic spine fracture. Alignment is normal. No other significant bone abnormalities are identified. 12 rib-bearing segments. IMPRESSION: Negative. Electronically Signed   By: Corlis Leak M.D.   On: 03/14/2020 16:42   DG Lumbar Spine Complete  Result Date: 03/14/2020 CLINICAL DATA:  Worsening chronic pain EXAM: LUMBAR SPINE - COMPLETE 4+ VIEW COMPARISON:  12/12/2016 FINDINGS: Stable minimal narrowing L3-4 interspace. Early anterior endplate spurs L1-L5 stable. No fracture or dislocation. Normal alignment. Facet DJD L4-5, L5-S1. IMPRESSION: Negative for fracture or other acute finding. Electronically Signed   By: Corlis Leak M.D.   On: 03/14/2020 16:44   I, [name], personally (independently) visualized and performed the interpretation of the images attached in this note.    Impression and Recommendations:    Assessment and Plan: 55 y.o. female with right cervical pain extending to the arm.  Distribution of pain is consistent with C8 radiculopathy.  Patient does have degenerative changes on C-spine on recent x-ray and very likely has nerve impingement.  Discussed options.  Currently taking prednisone already.  We will add gabapentin and tizanidine.  Patient already is doing physical  therapy for her left elbow.  The right shoulder and arm pain can be added onto which should be helpful.  If not rapidly improving will proceed with MRI.  The severity of her pain and weakness should  be enough to justify MRI.  MRI is for epidural steroid injection planning.  PDMP reviewed during this encounter. Orders Placed This Encounter  Procedures  . DG Shoulder Right    Standing Status:   Future    Standing Expiration Date:   03/16/2021    Order Specific Question:   Reason for Exam (SYMPTOM  OR DIAGNOSIS REQUIRED)    Answer:   right shoulder pain    Order Specific Question:   Is patient pregnant?    Answer:   No    Order Specific Question:   Preferred imaging location?    Answer:   [facility]   Meds ordered this encounter  Medications  . gabapentin (NEURONTIN) 300 MG capsule    Sig: Take 1 capsule (300 mg total) by mouth 3 (three) times daily as needed.    Dispense:  90 capsule    Refill:  3  . tiZANidine (ZANAFLEX) 4 MG tablet    Sig: Take 1 tablet (4 mg total) by mouth every 6 (six) hours as needed for muscle spasms.    Dispense:  30 tablet    Refill:  1    Discussed warning signs or symptoms. Please see discharge instructions. Patient expresses understanding.   The above documentation has been reviewed and is accurate and complete [name], M.D.

## 2020-03-17 ENCOUNTER — Ambulatory Visit: Payer: BC Managed Care – PPO | Admitting: Psychiatry

## 2020-03-17 ENCOUNTER — Telehealth: Payer: Self-pay | Admitting: Psychiatry

## 2020-03-17 DIAGNOSIS — G4733 Obstructive sleep apnea (adult) (pediatric): Secondary | ICD-10-CM | POA: Diagnosis not present

## 2020-03-17 DIAGNOSIS — E7800 Pure hypercholesterolemia, unspecified: Secondary | ICD-10-CM | POA: Diagnosis not present

## 2020-03-17 DIAGNOSIS — E1165 Type 2 diabetes mellitus with hyperglycemia: Secondary | ICD-10-CM | POA: Diagnosis not present

## 2020-03-17 DIAGNOSIS — D6861 Antiphospholipid syndrome: Secondary | ICD-10-CM | POA: Diagnosis not present

## 2020-03-17 NOTE — Telephone Encounter (Signed)
Have her come tomorrow morning as work in

## 2020-03-17 NOTE — Telephone Encounter (Signed)
Pt had apt this morning and had to RS. Pt needs apt ASAP WORK IN. Pt needs to discuss inpatient treatment. Next apt available 2/28 and on canc list. Pt asking for return call as well. Pt not doing well @ all. [phone]

## 2020-03-18 ENCOUNTER — Ambulatory Visit (INDEPENDENT_AMBULATORY_CARE_PROVIDER_SITE_OTHER): Payer: BC Managed Care – PPO | Admitting: Psychiatry

## 2020-03-18 ENCOUNTER — Encounter: Payer: Self-pay | Admitting: Family Medicine

## 2020-03-18 ENCOUNTER — Other Ambulatory Visit: Payer: Self-pay

## 2020-03-18 ENCOUNTER — Encounter: Payer: BLUE CROSS/BLUE SHIELD | Admitting: Family Medicine

## 2020-03-18 ENCOUNTER — Encounter: Payer: Self-pay | Admitting: Psychiatry

## 2020-03-18 DIAGNOSIS — G4710 Hypersomnia, unspecified: Secondary | ICD-10-CM | POA: Diagnosis not present

## 2020-03-18 DIAGNOSIS — R7989 Other specified abnormal findings of blood chemistry: Secondary | ICD-10-CM

## 2020-03-18 DIAGNOSIS — F411 Generalized anxiety disorder: Secondary | ICD-10-CM | POA: Diagnosis not present

## 2020-03-18 DIAGNOSIS — M25511 Pain in right shoulder: Secondary | ICD-10-CM | POA: Diagnosis not present

## 2020-03-18 DIAGNOSIS — F332 Major depressive disorder, recurrent severe without psychotic features: Secondary | ICD-10-CM | POA: Diagnosis not present

## 2020-03-18 DIAGNOSIS — G4733 Obstructive sleep apnea (adult) (pediatric): Secondary | ICD-10-CM | POA: Diagnosis not present

## 2020-03-18 DIAGNOSIS — G251 Drug-induced tremor: Secondary | ICD-10-CM

## 2020-03-18 NOTE — Telephone Encounter (Signed)
Can we ask her what her blood sugars have been reading?

## 2020-03-18 NOTE — Progress Notes (Signed)
Deborah Soto [id number] 28-Apr-1965 55 y.o.     Subjective:   Patient ID:  Deborah Soto is a 55 y.o. (DOB 22-Jul-1965) female.  Chief Complaint:  Chief Complaint  Patient presents with  . Follow-up  . Depression  . Anxiety   Depression        Associated symptoms include fatigue and myalgias.  Associated symptoms include no decreased concentration.    Deborah Soto presents to the office today for follow-up of TRD and anxiety, and insomnia.  seen December 08, 2018.  She was complaining of lithium tremor and some cognitive problems.  Hydroxyzine was stopped.  Propranolol increased to 60 mg twice daily for tremor.  Lithium level and amitriptyline levels were requested. Amitriptyline level to 37, nortriptyline 49 on amitriptyline 250 mg nightly. Lithium level 0.800 mg daily  Phone call on October 6 to discuss lab results as follows: Note   ----- Message from [name]., MD sent at 12/15/2018 10:08 AM EDT ----- Lithium level stable at 0.8 in a good range.  The previous was 0.7.  She is having some tremor issues.  Normal BMP including excellent creatinine and calcium levels.  Blood sugar is high but she is aware of that problem.  TSH is within normal limits.  Serum amitriptyline level is 286 which is at the upper end of the normal range and suggestive that we not try further increase.  We will discuss further options at her follow-up appointment.  No medication adjustments required, but because of her tremor and her lithium level is slightly higher than it was with the prior level if she wants to reduce the lithium from 3 of the 300 mg tablets daily to 2-1/2 of the 300 mg tablets daily she can do so.  At her last appointment she was also encouraged to try a higher dose of propranolol and that may have resolved the problem.  If it did do not reduce the lithium but if it did not and she wants to reduce the dose she can do so as noted.  She should call us if she has  any recurrence of symptoms.  Deborah Staggers MD, [name]     She had repeat lithium level at 750 mg daily of 0.  7 with some improvement in tremor.  In phone call on November 20 she reported mood was worse with weepiness, overreacting, "pity party".  Because mood symptoms were worse with the reduction lithium she was encouraged to increase the dosage back to 900 mg daily.   Last seen February 04, 2019.  The following changes were made: Increase propranolol to 80 mg twice daily for lithium tremor and anxiety her blood pressure and pulse appear high enough that she can tolerate this dosage.  Disc Se.  Disc risk with low BP and DM.  He has not been having any problems with low blood sugar. Amitriptyline 6 or 300 mg daily if tolerated.  Try to take some about 4 -5 hours before sleep Try to take lithium also 4-5 hours before sleep to minimize daytime tremor Add methylphenidate ER 27 mg in the morning.  If no response we will increase the dosage.  Seen May 08, 2019 with her husband. Aside from physically feeling like she can't do much then having memory issues.  Forgetful.  Loses track of thoughts between tasks.  Most embarrassing being around people and word-finding issues.   Problems with walking bc balance and weakness.  Fears falling. Only fall at Xmas morning.  Tremor  is awful.  Affects keying.   H notices cognitive problems and forgetfulness.    Shaking got really bad and got lithium level as low as 600 mg but the next day felt really helpless and everything seemed to be aimed at hurting me and felt disrespected.  Problems with boss and 55 yo taking 5 AP classes and 2 volleyball classes.  I've been on her to accomplish all these things.  She claimed that pt screaming at her.   H CO she was more irritable after the reduction in lithium.   H CO her anxiety also seems worse.  H says the last year has been unhealthy with 12 hour work days for her.   Doesn't go out much.    Attention problems more  noticeable.  Still dropping and tremor.  Tolerated the increase in propranolol.    CO shakiness from lithium which interferes with typing. Also a good bit of anxiety generally without panic.  BS are high.  NotTaking hydroxyzine.   sleeping better and still using CPAP.  Using CPAP regularly. Less awakening than before.  No SE. depression was worse after reducing the lithium.  Irritability was also worse after reducing the lithium to 600 mg daily.  seen May 08, 2019.  Multiple changes made:  Reduce propranolol to 60 mg twice daily for lithium tremor and anxiety since the increase was not helpful.   Restart B6 for lithium tremor. 500 mg BID. She forgot and doesn't want more. Try to take lithium also 4-5 hours before sleep to minimize daytime tremor. depression was worse after reducing the lithium.  Irritability was also worse after reducing the lithium to 600 mg daily. So reluctant to reduce it further.  It is unclear how to address the benefits of lithium without using lithium other than to consider Spravato Increase methylphenidate ER 36 mg in the morning. Increase methylphenidate ER 36 mg in the morning. Wait 1-2 weeks then reduce amitriptyline to 5 tablets nightly to see if cognition is better. Also saw Dr. Richardean Chimeraohmeir re: sleep disorder.  05/2019 appt with the following noted: Lost Rx of Concerta 36 so didn't take it long.  Out of it.  Still shakey and cognitive problems.  Dep 5/10/  Anxiety 6/10 worse at night.  No SI.  8 hours sleep.  Mild panic couple times daily. Frustrated with tremor and sleepiness which is worse than tiredness.   History of low vitamin D taking 150K/week but stopped 6 mos ago. Plan : Modafinil 100 mg tablet 1 dailly for 1 week, Then add Concerta 36 mg 1 each AM.  Option increase modafinil mid April  Restart B6 for lithium tremor. 500 mg BID. Get lab test at earliest convience  10/14/2019 appointment with the following noted: Has been on amitriptyline 250 mg for  mos.  Tried a week ago reducing to 200 and felt worse so back on 250. Weaned off Lyrica about a month ago.  Not much change off it.   Not sure if it helped energy or alertness. Saw Dr. Nonah MattesExert in consultation. Mental clarity is a lot better with stimulants and not sleeping as much in daytime with current meds.  Severe drowsiness is a lot better.   Off metformin and added Glipizide.   Not great with depression 5/10.  Anxiety can trigger problems with thinking and somatic sx   I feel like it's something physical and can keep her up at night. Occ racing heart or feeling hot.  Caffeine variable.  Plan:Plan: Increase methylphenidate  from 36 to 54 mg in the morning. Increase amitriptyline to 6 tablets in the evening    11/12/19 TC noting call: Pt called stated she's not feeling well at all. Ask for sooner apt than 10/7. Depression, anxiety & focus is much worse. Has not planned to hurt herself but thoughts of things would be better if I was not here have crossed her mind often. Contact ASAP @ [phone].  I put Pt on canc list  MD response: I reviewed the meds and previous med lists.  The next options are either Spravato or a major med change from amitriptyline which is not something I can do outside of an appt.  She's on cancellation list and we'll try to work her in sooner.  Nothing I can change right now except since the increase in amitriptyline has not helped, she can reduce it to 200 mg nightly in preparation for other changes to come. Nursing response after talking with pt: Discussed symptoms and medications with patient, she did not increase her Amitriptyline 50 mg up to 6 tablets so she will start that today. Said you may have told her that but she didn't remember. She has read about Spravato but she is not wanting to proceed with that. Informed her she's on cancellation list as well. She will call back with worsening signs or symptoms.  11/23/19 appt with the following noted: Forgot to increase  amitriptyline until noted. Tolerating it OK but hasn't had time to help. More alert with Concerta increase to 54 mg daily also with modafinil.  A little more focused and better productivity.  Still forgetful.   No SE noted except GI issues from diarrhea to constipation.   Still miserable and cry easily.  Try to numb herself.  Always tired especially emotionally.  Anxiety is high and predates current stimulants. [facility]  She felt he pushed too hard for her to make changes.  I don't feel like I can deal with him right now. Plan: Continue meds & The increase in amitriptyline to 300 mg daily needs more time.  01/12/2020 appointment with the following noted: Not well.  Mood horrible.  Easily frustrated sometimes without reason and may nap or cry.  Anxiety is pretty bad.  Daytime sleepiness seems worse.  Can get nausea from anxiety.  Feels like function is slow.  Have to write everything down.  Hates home, job, high school volleyball program.  At some point I have to take responsibility.   Amitriptyline also not helping pain any.  Clenching jaw for 3 weeks. Likcking lips.  Picking fingers. [facility] [employer] [facility] [place]. Plan: Reduce amitriptyline by 1 tablet every 4 days. Start Viibryd 10 mg daily with food for 7 days, then 20 mg daily for 7 days, then increase to 40 mg daily.  03/18/20 appt urgently made and noted:  Seen with husband. Still with jaw clenching day and night despite off stimulant. Now on Viibryd 60 mg since 03/02/20 and no stimulant., lithium 900 mg, and propranolol 80 BID. Rare hydroxyzine DT sleepiness, Gabapentin 300 TID. Not taking it with food.  LT sensitivity to noise and light and overstimulated and now bothering her more at work.  Always had to use shirts without tags and sensitivity to smells. Easily agitated in public per H.  Gets negative and wants to give up and not live anymore.  She says there's less chance of acting on it if she tells  H about it and she's  doing so now.   Pain worse off amitriptyline esp around ribs and general diffuse pain.  Anxiety worse with stimulants and hasn't gone away.  Trouble staying asleep.  Past Psychiatric Medication Trials: Failed ECT and TMS,  amitriptyline helped for 2 to 3 years in 1987,   2021 No benefit from amitriptyline 300 mg with adequate duration.  Nortriptyline 200 with a therapeutic blood level. Trintellix, Emsam, duloxetine 120, Lexapro 20, Fetzima, amitriptyline 300,  Wellbutrin, fluoxetine, paroxetine, sertraline,  lithium SE, Latuda 120, Abilify 15, Seroquel 400, risperidone, Geodon, Vraylar, Rexulti, Haldol for agitation, lamotrigine 300,  buspirone, clonazepam, gabapentin, modafinil, pramipexole,  Nuvigil, Vyvanse, Concerta SHE DOESN'T WANT STIMULANTS AGAIN. Trazodone, propranolol history of overdose on Xanax, There is a history of suicide attempts and psychiatric hospitalizations.  Review of Systems:  Review of Systems  Constitutional: Positive for fatigue.  Cardiovascular: Negative for chest pain and palpitations.  Gastrointestinal: Negative for diarrhea.  Musculoskeletal: Positive for back pain and myalgias. Negative for gait problem.  Neurological: Positive for tremors and weakness. Negative for dizziness.  Psychiatric/Behavioral: Positive for depression. Negative for confusion, decreased concentration, hallucinations and self-injury. The patient is nervous/anxious.     Medications: I have reviewed the patient's current medications.  Current Outpatient Medications  Medication Sig Dispense Refill  . atorvastatin (LIPITOR) 10 MG tablet TAKE 1 TABLET ONCE DAILY. 30 tablet 0  . celecoxib (CELEBREX) 200 MG capsule TAKE 1 CAPSULE TWICE DAILY FOR CHRONIC PAIN. 180 capsule 0  . doxycycline (VIBRAMYCIN) 100 MG capsule Take 100 mg by mouth daily.    Marland Kitchen gabapentin (NEURONTIN) 300 MG capsule Take 1 capsule (300 mg total) by mouth 3 (three) times daily as needed. 90 capsule  3  . glipiZIDE (GLUCOTROL XL) 10 MG 24 hr tablet TAKE 1 TABLET BY MOUTH TWICE DAILY WITH A MEAL. 180 tablet 0  . hydrOXYzine (ATARAX/VISTARIL) 25 MG tablet Take 1 tablet (25 mg total) by mouth 3 (three) times daily as needed. 30 tablet 0  . JARDIANCE 25 MG TABS tablet Take 25 mg by mouth daily.    Marland Kitchen lithium carbonate (LITHOBID) 300 MG CR tablet TAKE 3 TABLETS AT BEDTIME. 90 tablet 5  . pantoprazole (PROTONIX) 40 MG tablet TAKE 1 TABLET ONCE DAILY. 30 tablet 0  . predniSONE (DELTASONE) 20 MG tablet Take 1 tablet (20 mg total) by mouth daily with breakfast. 14 tablet 0  . propranolol (INDERAL) 20 MG tablet TAKE 4 TABLETS TWICE DAILY. 240 tablet 2  . SAXENDA 18 MG/3ML SOPN Inject into the skin.    Marland Kitchen tiZANidine (ZANAFLEX) 4 MG tablet Take 1 tablet (4 mg total) by mouth every 6 (six) hours as needed for muscle spasms. 30 tablet 1  . tretinoin (RETIN-A) 0.025 % cream Apply 1 application topically at bedtime.    . Vilazodone HCl (VIIBRYD) 40 MG TABS Take 1.5 tablets (60 mg total) by mouth daily. 45 tablet 1  . Vitamin D, Ergocalciferol, (DRISDOL) 1.25 MG (50000 UNIT) CAPS capsule 2 weekly 10 capsule 5  . XARELTO 10 MG TABS tablet TAKE 1 TABLET ONCE DAILY WITH FOOD. 30 tablet 0  . XARELTO 20 MG TABS tablet TAKE 1 TABLET IN THE EVENING WITH MEAL. 30 tablet 0   No current facility-administered medications for this visit.    Medication Side Effects: Sedation  Allergies:  Allergies  Allergen Reactions  . Tramadol Other (See Comments)    Tingling all over     Past Medical History:  Diagnosis Date  . Anxiety   . Asthma   .  Depression   . Diabetes mellitus type 2 in obese ([place]) 06/11/2016  . Embolism - blood clot January 1997 & January 2017   Also had LLE DVT in 1997  . Hypertension   . Preeclampsia 10/10/2011   1999   . Sleep apnea     Family History  Problem Relation Age of Onset  . Dementia Father   . Heart disease Father   . Clotting disorder Mother   . Arthritis Mother   .  Clotting disorder Sister   . Clotting disorder Maternal Grandmother   . Clotting disorder Maternal Aunt   . Rheumatologic disease Neg Hx   . Hyperparathyroidism Neg Hx     Social History   Socioeconomic History  . Marital status: Married    Spouse name: Not on file  . Number of children: Y  . Years of education: Not on file  . Highest education level: Not on file  Occupational History  . Occupation: admin assist    Comment: Pharmacist, community  Tobacco Use  . Smoking status: Former Smoker    Packs/day: 1.00    Years: 20.00    Pack years: 20.00    Quit date: 03/12/2004    Years since quitting: 16.0  . Smokeless tobacco: Never Used  Substance and Sexual Activity  . Alcohol use: Yes    Alcohol/week: 1.0 standard drink    Types: 1 Cans of beer per week    Comment: 1-2 times a week  . Drug use: No  . Sexual activity: Yes    Partners: Male    Birth control/protection: None  Other Topics Concern  . Not on file  Social History Narrative   Originally from [place]. Always lived in [place]. 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 No international travel recently. Previously has been to [place] in May 1996. No mold exposure recently. No bird exposure. Does have multiple pets.    Social Determinants of Health   Financial Resource Strain: Not on file  Food Insecurity: Not on file  Transportation Needs: Not on file  Physical Activity: Not on file  Stress: Not on file  Social Connections: Not on file  Intimate Partner Violence: Not on file    Past Medical History, Surgical history, Social history, and Family history were reviewed and updated as appropriate.   Please see review of systems for further details on the patient's review from today.   Objective:   Physical Exam:  LMP  (LMP Unknown) Comment: last period ---  1 year ago (?)  Physical Exam Constitutional:      General: She is not in acute distress. Musculoskeletal:        General: No deformity.  Neurological:      Mental Status: She is alert and oriented to person, place, and time.     Cranial Nerves: No dysarthria.     Coordination: Coordination normal.  Psychiatric:        Attention and Perception: Attention and perception normal. She does not perceive auditory or visual hallucinations.        Mood and Affect: Mood is anxious and depressed. Affect is not labile, blunt, angry, tearful or inappropriate.        Speech: Speech normal.        Behavior: Behavior normal. Behavior is cooperative.        Thought Content: Thought content normal. Thought content is not paranoid or delusional. Thought content does not include homicidal or suicidal ideation. Thought content does not include homicidal or suicidal  plan.        Cognition and Memory: Cognition and memory normal.        Judgment: Judgment normal.     Comments: Insight intact Depression no better     Lab Review:     Component Value Date/Time   NA 141 02/22/2020 1649   NA 140 12/11/2016 1236   K 4.2 02/22/2020 1649   K 4.2 12/11/2016 1236   CL 106 02/22/2020 1649   CO2 25 02/22/2020 1649   CO2 24 12/11/2016 1236   GLUCOSE 106 (H) 02/22/2020 1649   GLUCOSE 109 12/11/2016 1236   BUN 13 02/22/2020 1649   BUN 15.1 12/11/2016 1236   CREATININE 0.68 02/22/2020 1649   CREATININE 0.8 12/11/2016 1236   CALCIUM 10.6 (H) 02/22/2020 1649   CALCIUM 10.0 12/11/2016 1236   PROT 7.0 02/22/2020 1649   PROT 7.5 12/11/2016 1236   ALBUMIN 4.4 03/21/2017 1210   ALBUMIN 4.4 12/11/2016 1236   AST 21 02/22/2020 1649   AST 38 (H) 12/11/2016 1236   ALT 31 (H) 02/22/2020 1649   ALT 61 (H) 12/11/2016 1236   ALKPHOS 70 03/21/2017 1210   ALKPHOS 71 12/11/2016 1236   BILITOT 0.7 02/22/2020 1649   BILITOT 0.62 12/11/2016 1236   GFRNONAA 105 12/11/2018 1121   GFRAA 122 12/11/2018 1121       Component Value Date/Time   WBC 11.0 (H) 02/22/2020 1649   RBC 4.84 02/22/2020 1649   HGB 15.2 02/22/2020 1649   HGB 14.5 12/11/2016 1236   HCT 43.8 02/22/2020 1649    HCT 43.9 12/11/2016 1236   PLT 346 02/22/2020 1649   PLT 289 12/11/2016 1236   PLT 295 07/24/2016 1848   MCV 90.5 02/22/2020 1649   MCV 93.2 12/11/2016 1236   MCH 31.4 02/22/2020 1649   MCHC 34.7 02/22/2020 1649   RDW 13.1 02/22/2020 1649   RDW 13.5 12/11/2016 1236   LYMPHSABS 2,330 06/05/2017 1040   LYMPHSABS 2.4 12/11/2016 1236   MONOABS 0.7 12/11/2016 1236   EOSABS 692 (H) 06/05/2017 1040   EOSABS 0.7 (H) 12/11/2016 1236   BASOSABS 73 06/05/2017 1040   BASOSABS 0.0 12/11/2016 1236    Lithium Lvl  Date Value Ref Range Status  12/11/2018 0.8 0.6 - 1.2 mmol/L Final    Serum nortriptyline level on 150 mg a day was 109  Amitriptyline  Level 12/11/2018 =237  On 250 mg daily,.    No results found for: PHENYTOIN, PHENOBARB, VALPROATE, CBMZ   .res Assessment: Plan:    Severe recurrent major depression without psychotic features (HCC)  Generalized anxiety disorder  Hypersomnolence  Obstructive sleep apnea  Low vitamin D level  Lithium-induced tremor   Makynlie has chronic severe major depression and generalized anxiety that is treatment resistant and failed multiple medications ECT and TMS as noted above including SSRIs, try cyclic's, lithium, and atypical antipsychotics for depression.. She has had partial benefit from amitriptyline plus lithium.  However she is having tremor problems from the lithium.  Efforts to reduce the lithium have led to worsening psychiatric symptoms.  There has been a discussion about possible bipolar elements but she has no clear manic episodes unrelated to medication changes.  She has features of atypical depression.  So an MAO inhibitor is attractive from that perspective.  Discussed Spravato option in detail.  We will discuss it again at follow-up appointment.  She was given information to read in the interim and the treatment plan was discussed in terms of frequency and  side effects.  She is more open to this now.  Therefore consideration  could be given to other trials of MAO inhibitors as they are classically more effective than other antidepressants and atypical depression.  She did fail a trial of Emsam.  Consideration could be given to using tablet version of selegiline and going to much higher doses or the use of Parnate or Nardil.. Other option for TRD not tried is Symbyax but weight gain risk.  Option of adding olanzapine to current antidepressant but she reports she does not check her blood sugar very often and that would not be a good scenario with olanzapine.  Statistically speaking the most potentially beneficial option would be Parnate or Nardil but they are difficult to use and switching over the holidays is not ideal.  Because she is likely to feel worse coming off of an antidepressant and waiting to start the MAO inhibitor before she feels better from the MAO inhibitor.  She does not wish to go through that through the holidays.  Need to take Viibryd 60 with food emphasized.  She's not currently doing so.  We will check lithium level  Rec counseling.  Last did April 2019 after a hospitalization. She's uncommitted.  Encourage physical activity and weight loss.  She is not particularly motivated and feels that is hard to accomplish because of chronic pain.  Chronic pain complicates her treatment of depression.  B6 for lithium tremor. 500 mg BID. If needed.  Tremor is better.  Try to take lithium also 4-5 hours before sleep to minimize daytime tremor. depression was worse after reducing the lithium.  Irritability was also worse after reducing the lithium to 600 mg daily.   Pain worse off amitriptyline.  Can restart low dose amitriptyline 25 for pain.  No sig risk serotonin syndrome with this low of a dose.  History of low vitamin D taking 150K/week but stopped 6 mos ago.  Continue D 100K units weekly.  Follow-up 8 weeks  Deborah Staggers MD, [name]  Future Appointments  Date Time Provider Department Center   05/09/2020  3:30 PM Cottle, Steva Ready., MD [facility] None    No orders of the defined types were placed in this encounter.     -------------------------------

## 2020-03-18 NOTE — Patient Instructions (Signed)
[facility]

## 2020-03-18 NOTE — Telephone Encounter (Signed)
LM for pt tcb. 

## 2020-03-21 ENCOUNTER — Telehealth: Payer: Self-pay | Admitting: Psychiatry

## 2020-03-21 NOTE — Telephone Encounter (Signed)
Pt apparently called and asked to speak with Print production planner and Dr. Jennelle Human. She has a diagnosis of bipolar disorder in her history and does not believe it to be correct. Can we assist her in removing this diagnosis?

## 2020-03-21 NOTE — Telephone Encounter (Signed)
It is not listed in epic under her problem list or under her psychiatric diagnoses either.  We can discuss it at her next visit where she can tell me where she has seen it.  If it was listed in an old note I cannot remove it legally but I can reassure her that it is not listed in epic under [facility]  anywhere that I can locate

## 2020-03-22 ENCOUNTER — Encounter: Payer: BLUE CROSS/BLUE SHIELD | Admitting: Family Medicine

## 2020-03-23 ENCOUNTER — Other Ambulatory Visit: Payer: Self-pay | Admitting: *Deleted

## 2020-03-23 ENCOUNTER — Telehealth: Payer: Self-pay | Admitting: Family Medicine

## 2020-03-23 DIAGNOSIS — M549 Dorsalgia, unspecified: Secondary | ICD-10-CM

## 2020-03-23 NOTE — Telephone Encounter (Signed)
I spoke to pt regarding MRI location as recommended by her ins Herbalist). Pt was ok with having these done at [facility].   Pt asked that Dr. [name] be made aware that she went to [facility] 03/18/2020 due to pain. They did an xray and administered 3 steroid injections in her shoulder blade/upper back area.   She also noted upcoming appointment with Brayton Caves at [facility] for dry needling and PT.

## 2020-03-23 NOTE — Telephone Encounter (Signed)
Deborah Soto saw this in information from 2013 input into Epic. I discussed with her that she will need to send an Amendment Request to [facility] HIM Department for them to review. I have mailed that out to her.

## 2020-03-23 NOTE — Telephone Encounter (Signed)
FYI

## 2020-03-24 DIAGNOSIS — F332 Major depressive disorder, recurrent severe without psychotic features: Secondary | ICD-10-CM | POA: Diagnosis not present

## 2020-03-24 DIAGNOSIS — M25511 Pain in right shoulder: Secondary | ICD-10-CM | POA: Diagnosis not present

## 2020-03-24 DIAGNOSIS — M79602 Pain in left arm: Secondary | ICD-10-CM | POA: Diagnosis not present

## 2020-03-28 ENCOUNTER — Encounter: Payer: Self-pay | Admitting: Family Medicine

## 2020-03-29 ENCOUNTER — Other Ambulatory Visit: Payer: Self-pay | Admitting: Psychiatry

## 2020-03-29 NOTE — Telephone Encounter (Signed)
See note

## 2020-03-30 ENCOUNTER — Telehealth: Payer: Self-pay | Admitting: Psychiatry

## 2020-03-30 NOTE — Telephone Encounter (Signed)
Pt still not sleeping at all. Pt also is having shortness of breath and chest tightness. Pt said that she thinks it could be from the Viibryd dose change. Please call.

## 2020-03-30 NOTE — Telephone Encounter (Signed)
Please advise 

## 2020-03-30 NOTE — Telephone Encounter (Signed)
See note

## 2020-03-31 DIAGNOSIS — F332 Major depressive disorder, recurrent severe without psychotic features: Secondary | ICD-10-CM | POA: Diagnosis not present

## 2020-04-03 ENCOUNTER — Emergency Department (HOSPITAL_COMMUNITY)
Admission: EM | Admit: 2020-04-03 | Discharge: 2020-04-03 | Discharge status: Against Medical Advice | Payer: BC Managed Care – PPO

## 2020-04-03 ENCOUNTER — Ambulatory Visit (HOSPITAL_COMMUNITY)
Admission: EM | Admit: 2020-04-03 | Discharge: 2020-04-03 | Disposition: A | Discharge status: Other Acute Inpatient Hospital | Payer: BC Managed Care – PPO | Attending: Internal Medicine | Admitting: Internal Medicine

## 2020-04-03 NOTE — ED Notes (Signed)
Called pt x3 for triage, no response. 

## 2020-04-03 NOTE — ED Triage Notes (Signed)
Per [place],PA Pt stable to go by PV to ED

## 2020-04-05 DIAGNOSIS — M25511 Pain in right shoulder: Secondary | ICD-10-CM | POA: Diagnosis not present

## 2020-04-05 DIAGNOSIS — M79602 Pain in left arm: Secondary | ICD-10-CM | POA: Diagnosis not present

## 2020-04-06 ENCOUNTER — Other Ambulatory Visit: Payer: Self-pay

## 2020-04-06 ENCOUNTER — Other Ambulatory Visit: Payer: Self-pay | Admitting: Psychiatry

## 2020-04-06 ENCOUNTER — Ambulatory Visit: Payer: BC Managed Care – PPO | Admitting: Family Medicine

## 2020-04-06 ENCOUNTER — Telehealth: Payer: Self-pay | Admitting: Psychiatry

## 2020-04-06 VITALS — BP 115/75 | HR 82 | Temp 98.3°F | Ht 68.0 in | Wt 233.4 lb

## 2020-04-06 DIAGNOSIS — E538 Deficiency of other specified B group vitamins: Secondary | ICD-10-CM

## 2020-04-06 DIAGNOSIS — G4700 Insomnia, unspecified: Secondary | ICD-10-CM

## 2020-04-06 DIAGNOSIS — E559 Vitamin D deficiency, unspecified: Secondary | ICD-10-CM | POA: Diagnosis not present

## 2020-04-06 DIAGNOSIS — I2699 Other pulmonary embolism without acute cor pulmonale: Secondary | ICD-10-CM

## 2020-04-06 DIAGNOSIS — G629 Polyneuropathy, unspecified: Secondary | ICD-10-CM | POA: Insufficient documentation

## 2020-04-06 DIAGNOSIS — M79604 Pain in right leg: Secondary | ICD-10-CM | POA: Diagnosis not present

## 2020-04-06 DIAGNOSIS — M5010 Cervical disc disorder with radiculopathy, unspecified cervical region: Secondary | ICD-10-CM | POA: Diagnosis not present

## 2020-04-06 DIAGNOSIS — R079 Chest pain, unspecified: Secondary | ICD-10-CM

## 2020-04-06 LAB — COMPREHENSIVE METABOLIC PANEL
ALT: 32 U/L (ref 0–35)
AST: 18 U/L (ref 0–37)
Albumin: 4.4 g/dL (ref 3.5–5.2)
Alkaline Phosphatase: 53 U/L (ref 39–117)
BUN: 16 mg/dL (ref 6–23)
CO2: 28 mEq/L (ref 19–32)
Calcium: 10.1 mg/dL (ref 8.4–10.5)
Chloride: 106 mEq/L (ref 96–112)
Creatinine, Ser: 0.7 mg/dL (ref 0.40–1.20)
GFR: 99.47 mL/min (ref >60.00)
Glucose, Bld: 159 mg/dL — ABNORMAL HIGH (ref 70–99)
Potassium: 4.7 mEq/L (ref 3.5–5.1)
Sodium: 141 mEq/L (ref 135–145)
Total Bilirubin: 0.7 mg/dL (ref 0.2–1.2)
Total Protein: 6.8 g/dL (ref 6.0–8.3)

## 2020-04-06 LAB — TSH: TSH: 1.32 u[IU]/mL (ref 0.35–4.50)

## 2020-04-06 LAB — VITAMIN D 25 HYDROXY (VIT D DEFICIENCY, FRACTURES): VITD: 32.07 ng/mL (ref 30.00–100.00)

## 2020-04-06 LAB — CBC
HCT: 44.3 % (ref 36.0–46.0)
Hemoglobin: 14.5 g/dL (ref 12.0–15.0)
MCHC: 32.6 g/dL (ref 30.0–36.0)
MCV: 91.3 fl (ref 78.0–100.0)
Platelets: 229 10*3/uL (ref 150.0–400.0)
RBC: 4.85 Mil/uL (ref 3.87–5.11)
RDW: 14.4 % (ref 11.5–15.5)
WBC: 8.4 10*3/uL (ref 4.0–10.5)

## 2020-04-06 LAB — VITAMIN B12: Vitamin B-12: 210 pg/mL — ABNORMAL LOW (ref 211–911)

## 2020-04-06 MED ORDER — PROPRANOLOL HCL 40 MG PO TABS: ORAL_TABLET | ORAL | 0 refills | Status: DC

## 2020-04-06 NOTE — Assessment & Plan Note (Signed)
Improving with PT and after trigger point injections.

## 2020-04-06 NOTE — Telephone Encounter (Signed)
RX sent

## 2020-04-06 NOTE — Telephone Encounter (Signed)
Pt would like a refill on Propranolol. Pt said that it was discussed in her last visit that a rx for a 40mg  tablet could be sent in. Please send to [facility].

## 2020-04-06 NOTE — Patient Instructions (Signed)
It was very nice to see you today!  Please try increasing your gabapentin to 600 mg at night.  No other medication changes today.  We will get an ultrasound of your leg.  I would also like to get a CT scan of your heart to make sure that you do not have any calcium buildup or blockages.  We will check blood work today.  I will see you back in a month or so.  Please come back to see me sooner if needed.  Take care, Dr [name]  Please try these tips to maintain a healthy lifestyle:   Eat at least 3 REAL meals and 1-2 snacks per day.  Aim for no more than 5 hours between eating.  If you eat breakfast, please do so within one hour of getting up.    Each meal should contain half fruits/vegetables, one quarter protein, and one quarter carbs (no bigger than a computer mouse)   Cut down on sweet beverages. This includes juice, soda, and sweet tea.     Drink at least 1 glass of water with each meal and aim for at least 8 glasses per day   Exercise at least 150 minutes every week.

## 2020-04-06 NOTE — Assessment & Plan Note (Signed)
check B12 level today.

## 2020-04-06 NOTE — Assessment & Plan Note (Signed)
Anticoagulated on Xarelto - doubt recurrence.

## 2020-04-06 NOTE — Assessment & Plan Note (Signed)
Likely multifactorial.  Will check B12 TSH and other labs today.  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  Diabetes also possibly playing a role.  Recommended she increase her gabapentin to 300 mg in the morning, 300 mg in the afternoon, and 600 mg at night.

## 2020-04-06 NOTE — Assessment & Plan Note (Signed)
Hopefully should have some improvement with increased gabapentin dose.

## 2020-04-06 NOTE — Progress Notes (Signed)
Deborah Soto is a 55 y.o. female who presents today for an office visit.  Assessment/Plan:  New/Acute Problems: Chest Pain EKG at urgent care normal a couple of days ago.  She has a few cardiovascular risk factors.  Given that she is currently asymptomatic do not think she needs emergent evaluation at this point.  Will check CT calcium score.  May need referral to cardiology depending on the results.  Doubt PE given that she is on Xarelto.  Possibly musculoskeletal/inflammatory.  GERD/gastritis also a possibility.  We will look into other possible etiologies depending on results of CT calcium score  Right lower extremity swelling Possibly secondary to side effect of prednisone.  She also has neuropathy which could be contributing.  Very low likelihood for DVT given that she is anticoagulated on Xarelto however she tells me that she has had blood clots even while on anticoagulation before.  Will check ultrasound to rule out.  Chronic Problems Addressed Today: Cervical disc disorder with radiculopathy of cervical region Improving with PT and after trigger point injections.   PTE (pulmonary thromboembolism) (HCC) Anticoagulated on Xarelto - doubt recurrence.   Neuropathy Likely multifactorial.  Will check B12 TSH and other labs today.  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  Diabetes also possibly playing a role.  Recommended she increase her gabapentin to 300 mg in the morning, 300 mg in the afternoon, and 600 mg at night.  Insomnia Hopefully should have some improvement with increased gabapentin dose.  Vitamin B12 deficiency  check B12 level today.       Subjective:  HPI:  Patient here for follow-up.  She was last seen 1 month ago for virtual visit.  Was concern for migraines at that time.  We also discussed her diffuse pain and neck pain secondary to cervical disc generation.  We will try her on prednisone burst.  She is  not sure if it helped.  She was also referred to sports medicine.  She saw Ortho urgent care and had trigger point injections which seem to help.  She is also been working with physical therapy which seems to be helping with her shoulder pain.  Sports medicine had gabapentin and Zanaflex.  Zanaflex did not seem to help but she thinks that gabapentin is helped modestly.  She is also had an issue for the last week or so with intermittent chest tightness.  Occurs randomly.  Nonexertional.  No obvious triggering or alleviating factors.  Symptoms last for a few minutes and then subside.  She has some associated nausea afterwards.  She went to urgent care and had EKG done which was normal.  No current chest pain.  She saw her psychiatrist since her last visit.  Her mood has worsened recently.  She has also had more trouble sleeping.  She has had some increasing pain in her feet as well.  She saw her endocrinologist recently who told her it was due to her diabetes.  Pain described as a burning sensation.  Located in both legs but right is worse than left.  Is also concerned because she has more swelling on right side of her leg with more redness.  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  While at urgent care they recommended to get ultrasound to rule this out however she was unable to get this done.         Objective:  Physical Exam: BP 115/75   Pulse 82  Temp 98.3 F (36.8 C) (Temporal)   Ht 5\' 8"  (1.727 m)   Wt 233 lb 6.4 oz (105.9 kg)   LMP  (LMP Unknown) Comment: last period ---  1 year ago (?)  SpO2 97%   BMI 35.49 kg/m   Gen: No acute distress, resting comfortably CV: Regular rate and rhythm with no murmurs appreciated Pulm: Normal work of breathing, clear to auscultation bilaterally with no crackles, wheezes, or rhonchi MSK: 2+ pitting edema to right lower extremity with some redness in the posterior calf. Neuro: Grossly normal, moves all extremities Psych: Normal affect  and thought content  Time Spent: 45 minutes of total time was spent on the date of the encounter performing the following actions: chart review prior to seeing the patient including recent visits with specialists, obtaining history, performing a medically necessary exam, counseling on the treatment plan, placing orders, and documenting in our EHR.        . Katina Degree, MD 04/06/2020 9:45 AM

## 2020-04-07 ENCOUNTER — Ambulatory Visit
Admission: RE | Admit: 2020-04-07 | Discharge: 2020-04-07 | Disposition: A | Discharge status: Home / Self Care | Payer: BC Managed Care – PPO | Source: Ambulatory Visit | Attending: Family Medicine | Admitting: Family Medicine

## 2020-04-07 DIAGNOSIS — F332 Major depressive disorder, recurrent severe without psychotic features: Secondary | ICD-10-CM | POA: Diagnosis not present

## 2020-04-07 DIAGNOSIS — M79604 Pain in right leg: Secondary | ICD-10-CM

## 2020-04-07 NOTE — Progress Notes (Signed)
Please inform patient of the following:  Her B12 level is low. This could explain some of her symptoms. Would like for her to start the replacement protocol here.  Her ultrasound showed no new clots but did show possibly old clots. This is not causing her symptoms and do not need to do any further treatment with this as she is on her blood thinner.  Her vitamin D was on the low side of normal. Would like for her to take 1000-2000 IU of vitamin D daily if she is not doing so already and we can recheck in a few months.   We will contact her results of her CT scan once we get them back.  Katina Degree. [name], MD 04/07/2020 12:56 PM

## 2020-04-08 ENCOUNTER — Encounter: Payer: Self-pay | Admitting: Family Medicine

## 2020-04-08 NOTE — Telephone Encounter (Signed)
Please advise 

## 2020-04-12 ENCOUNTER — Telehealth: Payer: Self-pay

## 2020-04-12 ENCOUNTER — Other Ambulatory Visit: Payer: Self-pay | Admitting: *Deleted

## 2020-04-12 DIAGNOSIS — G5622 Lesion of ulnar nerve, left upper limb: Secondary | ICD-10-CM | POA: Diagnosis not present

## 2020-04-12 MED ORDER — GABAPENTIN 300 MG PO CAPS: 300.0000 mg | ORAL_CAPSULE | Freq: Three times a day (TID) | ORAL | 3 refills | Status: DC | PRN

## 2020-04-12 MED ORDER — B-12 COMPLIANCE INJECTION 1000 MCG/ML IJ KIT: PACK | INTRAMUSCULAR | 0 refills | Status: DC

## 2020-04-12 NOTE — Telephone Encounter (Signed)
Refill sent.

## 2020-04-12 NOTE — Telephone Encounter (Signed)
..   LAST APPOINTMENT DATE: 04/06/2020   NEXT APPOINTMENT DATE:@2 /22/2022  MEDICATION:gabapentin (NEURONTIN) 300 MG capsule  PHARMACY:[facility] - [place], [place] - [address] Please advise  Pt states Dr. [name] advised her to take 2 at night at last OV. Pt says her old prescription doesn't allow for that many.   Pt is requesting for this today, as she is having to go out of town for work, unplanned.

## 2020-04-15 ENCOUNTER — Encounter: Payer: Self-pay | Admitting: Family Medicine

## 2020-04-15 NOTE — Telephone Encounter (Signed)
See note

## 2020-04-18 ENCOUNTER — Other Ambulatory Visit: Payer: Self-pay | Admitting: Psychiatry

## 2020-04-18 NOTE — Telephone Encounter (Signed)
FYI

## 2020-04-19 DIAGNOSIS — M79602 Pain in left arm: Secondary | ICD-10-CM | POA: Diagnosis not present

## 2020-04-19 DIAGNOSIS — J029 Acute pharyngitis, unspecified: Secondary | ICD-10-CM | POA: Diagnosis not present

## 2020-04-19 DIAGNOSIS — M25511 Pain in right shoulder: Secondary | ICD-10-CM | POA: Diagnosis not present

## 2020-04-20 DIAGNOSIS — G5622 Lesion of ulnar nerve, left upper limb: Secondary | ICD-10-CM | POA: Diagnosis not present

## 2020-04-22 ENCOUNTER — Ambulatory Visit (INDEPENDENT_AMBULATORY_CARE_PROVIDER_SITE_OTHER): Payer: BC Managed Care – PPO | Admitting: Psychiatry

## 2020-04-22 ENCOUNTER — Other Ambulatory Visit: Payer: Self-pay

## 2020-04-22 ENCOUNTER — Encounter: Payer: Self-pay | Admitting: Psychiatry

## 2020-04-22 DIAGNOSIS — F411 Generalized anxiety disorder: Secondary | ICD-10-CM | POA: Diagnosis not present

## 2020-04-22 DIAGNOSIS — F332 Major depressive disorder, recurrent severe without psychotic features: Secondary | ICD-10-CM

## 2020-04-22 DIAGNOSIS — G4733 Obstructive sleep apnea (adult) (pediatric): Secondary | ICD-10-CM

## 2020-04-22 DIAGNOSIS — G4710 Hypersomnia, unspecified: Secondary | ICD-10-CM | POA: Diagnosis not present

## 2020-04-22 DIAGNOSIS — R52 Pain, unspecified: Secondary | ICD-10-CM

## 2020-04-22 DIAGNOSIS — R7989 Other specified abnormal findings of blood chemistry: Secondary | ICD-10-CM

## 2020-04-22 MED ORDER — VIIBRYD 40 MG PO TABS: 60.0000 mg | ORAL_TABLET | Freq: Every day | ORAL | 1 refills | Status: DC

## 2020-04-22 MED ORDER — LITHIUM CARBONATE ER 300 MG PO TBCR: 900.0000 mg | EXTENDED_RELEASE_TABLET | Freq: Every day | ORAL | 3 refills | Status: DC

## 2020-04-22 NOTE — Progress Notes (Signed)
Deborah Soto [id number] Aug 23, 1965 55 y.o.     Subjective:   Patient ID:  Deborah Soto is a 55 y.o. (DOB [birth year]) female.  Chief Complaint:  Chief Complaint  Patient presents with  . Follow-up  . 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  Associated symptoms include no decreased concentration.    Deborah Soto presents to the office today for follow-up of TRD and anxiety, and insomnia.  seen December 08, 2018.  She was complaining of lithium tremor and some cognitive problems.  Hydroxyzine was stopped.  Propranolol increased to 60 mg twice daily for tremor.  Lithium level and amitriptyline levels were requested. Amitriptyline level to 37, nortriptyline 49 on amitriptyline 250 mg nightly. Lithium level 0.800 mg daily  Phone call on October 6 to discuss lab results as follows: Note   ----- Message from [name]., MD sent at 12/15/2018 10:08 AM EDT ----- Lithium level stable at 0.8 in a good range.  The previous was 0.7.  She is having some tremor issues.  Normal BMP including excellent creatinine and calcium levels.  Blood sugar is high but she is aware of that problem.  TSH is within normal limits.  Serum amitriptyline level is 286 which is at the upper end of the normal range and suggestive that we not try further increase.  We will discuss further options at her follow-up appointment.  No medication adjustments required, but because of her tremor and her lithium level is slightly higher than it was with the prior level if she wants to reduce the lithium from 3 of the 300 mg tablets daily to 2-1/2 of the 300 mg tablets daily she can do so.  At her last appointment she was also encouraged to try a higher dose of propranolol and that may have resolved the problem.  If it did do not reduce the lithium but if it did not and she wants to reduce the dose she can do so as  noted.  She should call us if she has any recurrence of symptoms.  Deborah Parents MD, [name]     She had repeat lithium level at 750 mg daily of 0.  7 with some improvement in tremor.  In phone call on November 20 she reported mood was worse with weepiness, overreacting, "pity party".  Because mood symptoms were worse with the reduction lithium she was encouraged to increase the dosage back to 900 mg daily.   Last seen February 04, 2019.  The following changes were made: Increase propranolol to 80 mg twice daily for lithium tremor and anxiety her blood pressure and pulse appear high enough that she can tolerate this dosage.  Disc Se.  Disc risk with low BP and DM.  He has not been having any problems with low blood sugar. Amitriptyline 6 or 300 mg daily if tolerated.  Try to take some about 4 -5 hours before sleep Try to take lithium also 4-5 hours before sleep to minimize daytime tremor Add methylphenidate ER 27 mg in the morning.  If no response we will increase the dosage.  Seen May 08, 2019 with her husband. Aside from physically feeling like she can't do much then having memory issues.  Forgetful.  Loses track of thoughts between tasks.  Most embarrassing being around people and word-finding issues.   Problems with walking bc balance and weakness.  Fears falling. Only fall at [facility]  morning.  Tremor is awful.  Affects keying.   H notices cognitive problems and forgetfulness.    Shaking got really bad and got lithium level as low as 600 mg but the next day felt really helpless and everything seemed to be aimed at hurting me and felt disrespected.  Problems with boss and 55 yo taking 5 AP classes and 2 volleyball classes.  I've been on her to accomplish all these things.  She claimed that pt screaming at her.   H CO she was more irritable after the reduction in lithium.   H CO her anxiety also seems worse.  H says the last year has been unhealthy with 12 hour work days for her.   Doesn't go  out much.    Attention problems more noticeable.  Still dropping and tremor.  Tolerated the increase in propranolol.    CO shakiness from lithium which interferes with typing. Also a good bit of anxiety generally without panic.  BS are high.  NotTaking hydroxyzine.   sleeping better and still using CPAP.  Using CPAP regularly. Less awakening than before.  No SE. depression was worse after reducing the lithium.  Irritability was also worse after reducing the lithium to 600 mg daily.  seen May 08, 2019.  Multiple changes made:  Reduce propranolol to 60 mg twice daily for lithium tremor and anxiety since the increase was not helpful.   Restart B6 for lithium tremor. 500 mg BID. She forgot and doesn't want more. Try to take lithium also 4-5 hours before sleep to minimize daytime tremor. depression was worse after reducing the lithium.  Irritability was also worse after reducing the lithium to 600 mg daily. So reluctant to reduce it further.  It is unclear how to address the benefits of lithium without using lithium other than to consider Spravato Increase methylphenidate ER 36 mg in the morning. Increase methylphenidate ER 36 mg in the morning. Wait 1-2 weeks then reduce amitriptyline to 5 tablets nightly to see if cognition is better. Also saw Dr. [name] re: sleep disorder.  05/2019 appt with the following noted: Lost Rx of Concerta 36 so didn't take it long.  Out of it.  Still shakey and cognitive problems.  Dep 5/10/  Anxiety 6/10 worse at night.  No SI.  8 hours sleep.  Mild panic couple times daily. Frustrated with tremor and sleepiness which is worse than tiredness.   History of low vitamin D taking 150K/week but stopped 6 mos ago. Plan : Modafinil 100 mg tablet 1 dailly for 1 week, Then add Concerta 36 mg 1 each AM.  Option increase modafinil mid April  Restart B6 for lithium tremor. 500 mg BID. Get lab test at earliest convience  10/14/2019 appointment with the following  noted: Has been on amitriptyline 250 mg for mos.  Tried a week ago reducing to 200 and felt worse so back on 250. Weaned off Lyrica about a month ago.  Not much change off it.   Not sure if it helped energy or alertness. Saw Dr. Cardell Peach in consultation. Mental clarity is a lot better with stimulants and not sleeping as much in daytime with current meds.  Severe drowsiness is a lot better.   Off metformin and added Glipizide.   Not great with depression 5/10.  Anxiety can trigger problems with thinking and somatic sx   I feel like it's something physical and can keep her up at night. Occ racing heart or feeling hot.  Caffeine variable.  Plan:Plan: Increase methylphenidate from 36 to 54 mg in the morning. Increase amitriptyline to 6 tablets in the evening    11/12/19 TC noting call: Pt called stated she's not feeling well at all. Ask for sooner apt than 10/7. Depression, anxiety & focus is much worse. Has not planned to hurt herself but thoughts of things would be better if I was not here have crossed her mind often. Contact ASAP @ [phone].  I put Pt on canc list  MD response: I reviewed the meds and previous med lists.  The next options are either Spravato or a major med change from amitriptyline which is not something I can do outside of an appt.  She's on cancellation list and we'll try to work her in sooner.  Nothing I can change right now except since the increase in amitriptyline has not helped, she can reduce it to 200 mg nightly in preparation for other changes to come. Nursing response after talking with pt: Discussed symptoms and medications with patient, she did not increase her Amitriptyline 50 mg up to 6 tablets so she will start that today. Said you may have told her that but she didn't remember. She has read about Spravato but she is not wanting to proceed with that. Informed her she's on cancellation list as well. She will call back with worsening signs or symptoms.  11/23/19 appt with  the following noted: Forgot to increase amitriptyline until noted. Tolerating it OK but hasn't had time to help. More alert with Concerta increase to 54 mg daily also with modafinil.  A little more focused and better productivity.  Still forgetful.   No SE noted except GI issues from diarrhea to constipation.   Still miserable and cry easily.  Try to numb herself.  Always tired especially emotionally.  Anxiety is high and predates current stimulants. No therapy since [facility].  She felt he pushed too hard for her to make changes.  I don't feel like I can deal with him right now. Plan: Continue meds & The increase in amitriptyline to 300 mg daily needs more time.  01/12/2020 appointment with the following noted: Not well.  Mood horrible.  Easily frustrated sometimes without reason and may nap or cry.  Anxiety is pretty bad.  Daytime sleepiness seems worse.  Can get nausea from anxiety.  Feels like function is slow.  Have to write everything down.  Hates home, job, high school volleyball program.  At some point I have to take responsibility.   Amitriptyline also not helping pain any.  Clenching jaw for 3 weeks. Likcking lips.  Picking fingers. D will play volleyball at [employer] and [place]. Plan: Reduce amitriptyline by 1 tablet every 4 days. Start Viibryd 10 mg daily with food for 7 days, then 20 mg daily for 7 days, then increase to 40 mg daily.  03/18/20 appt urgently made and noted:  Seen with husband. Still with jaw clenching day and night despite off stimulant. Now on Viibryd 60 mg since 03/02/20 and no stimulant., lithium 900 mg, and propranolol 80 BID. Rare hydroxyzine DT sleepiness, Gabapentin 300 TID. Not taking it with food.  LT sensitivity to noise and light and overstimulated and now bothering her more at work.  Always had to use shirts without tags and sensitivity to smells. Easily agitated in public per H.  Gets negative and wants to give up and not live anymore.  She says there's  less chance of acting on it if she tells H about  it and she's doing so now.  Pain worse off amitriptyline esp around ribs and general diffuse pain.  Anxiety worse with stimulants and hasn't gone away.  Trouble staying asleep. Plan: Need to take Viibryd 60 with food emphasized.  She's not currently doing so. Restart low-dose amitriptyline for pain  04/22/2020 appointment with following noted: Only restarted amitriptyline 50 mg last week.  Stays asleep better with it.  Sleep 8-9 hours now but no change in pain yet.  Sleepier in day recently. Done a lot of travel lately. Maybe viibryd making a little difference with mood and anxiety now that has been on it longer.  Needs to take Viibryd in AM bc seems to interfere with sleep. Final season of travel volleyball with good team. H notes still some irritability.     Past Psychiatric Medication Trials: Failed ECT and TMS,  amitriptyline helped for 2 to 3 years in 1987,   2021 No benefit from amitriptyline 300 mg with adequate duration.  Nortriptyline 200 with a therapeutic blood level. Trintellix, Emsam, duloxetine 120, Lexapro 20, Fetzima,  Wellbutrin, fluoxetine, paroxetine, sertraline,  lithium SE,  Latuda 120, Abilify 15,  Vraylar, Rexulti,Seroquel 400, risperidone, Geodon, Haldol for agitation, lamotrigine 300,  buspirone, clonazepam, gabapentin, modafinil, pramipexole,  Nuvigil, Vyvanse, Concerta SHE DOESN'T WANT STIMULANTS AGAIN bc of anxiety. Trazodone, propranolol history of overdose on Xanax, There is a history of suicide attempts and psychiatric hospitalizations.  Review of Systems:  Review of Systems  Constitutional: Positive for fatigue.  Cardiovascular: Negative for chest pain and palpitations.  Gastrointestinal: Negative for diarrhea.  Musculoskeletal: Positive for back pain and myalgias. Negative for gait problem.  Neurological: Positive for tremors and weakness. Negative for dizziness.  Psychiatric/Behavioral: Positive for  depression. Negative for confusion, decreased concentration, hallucinations and self-injury. The patient is nervous/anxious.     Medications: I have reviewed the patient's current medications.  Current Outpatient Medications  Medication Sig Dispense Refill  . atorvastatin (LIPITOR) 10 MG tablet TAKE 1 TABLET ONCE DAILY. 30 tablet 0  . celecoxib (CELEBREX) 200 MG capsule TAKE 1 CAPSULE TWICE DAILY FOR CHRONIC PAIN. 180 capsule 0  . Cyanocobalamin (B-12 COMPLIANCE INJECTION) 1000 MCG/ML KIT One Injection weekly for 4 weeks then one injection monthly for 3 month 7 kit 0  . doxycycline (VIBRAMYCIN) 100 MG capsule Take 100 mg by mouth daily.    Marland Kitchen gabapentin (NEURONTIN) 300 MG capsule Take 1 capsule (300 mg total) by mouth 3 (three) times daily as needed. 90 capsule 3  . glipiZIDE (GLUCOTROL XL) 10 MG 24 hr tablet TAKE 1 TABLET BY MOUTH TWICE DAILY WITH A MEAL. 180 tablet 0  . hydrOXYzine (ATARAX/VISTARIL) 25 MG tablet Take 1 tablet (25 mg total) by mouth 3 (three) times daily as needed. 30 tablet 0  . JARDIANCE 25 MG TABS tablet Take 25 mg by mouth daily.    . pantoprazole (PROTONIX) 40 MG tablet TAKE 1 TABLET ONCE DAILY. 30 tablet 0  . propranolol (INDERAL) 40 MG tablet 1-2 tablets three times daily as needed for anxiety 150 tablet 0  . tiZANidine (ZANAFLEX) 4 MG tablet Take 1 tablet (4 mg total) by mouth every 6 (six) hours as needed for muscle spasms. 30 tablet 1  . tretinoin (RETIN-A) 0.025 % cream Apply 1 application topically at bedtime.    Alveda Reasons 20 MG TABS tablet TAKE 1 TABLET IN THE EVENING WITH MEAL. 30 tablet 0  . lithium carbonate (LITHOBID) 300 MG CR tablet Take 3 tablets (900 mg total) by mouth  at bedtime. 90 tablet 3  . SAXENDA 18 MG/3ML SOPN Inject into the skin. (Patient not taking: Reported on 04/22/2020)    . Vilazodone HCl (VIIBRYD) 40 MG TABS Take 1.5 tablets (60 mg total) by mouth daily. 45 tablet 1   No current facility-administered medications for this visit.     Medication Side Effects: Sedation  Allergies:  Allergies  Allergen Reactions  . Tramadol Other (See Comments)    Tingling all over     Past Medical History:  Diagnosis Date  . Anxiety   . Asthma   . Depression   . Diabetes mellitus type 2 in obese ([place]) 06/11/2016  . Embolism - blood clot January 1997 & January 2017   Also had LLE DVT in 1997  . Hypertension   . Preeclampsia 10/10/2011   1999   . Sleep apnea     Family History  Problem Relation Age of Onset  . Dementia Father   . Heart disease Father   . Clotting disorder Mother   . Arthritis Mother   . Clotting disorder Sister   . Clotting disorder Maternal Grandmother   . Clotting disorder Maternal Aunt   . Rheumatologic disease Neg Hx   . Hyperparathyroidism Neg Hx     Social History   Socioeconomic History  . Marital status: Married    Spouse name: Not on file  . Number of children: Y  . Years of education: Not on file  . Highest education level: Not on file  Occupational History  . Occupation: admin assist    Comment: Pharmacist, community  Tobacco Use  . Smoking status: Former Smoker    Packs/day: 1.00    Years: 20.00    Pack years: 20.00    Quit date: 03/12/2004    Years since quitting: 16.1  . Smokeless tobacco: Never Used  Substance and Sexual Activity  . Alcohol use: Yes    Alcohol/week: 1.0 standard drink    Types: 1 Cans of beer per week    Comment: 1-2 times a week  . Drug use: No  . Sexual activity: Yes    Partners: Male    Birth control/protection: None  Other Topics Concern  . Not on file  Social History Narrative   Originally from [place]. Always lived in [place]. 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 No international travel recently. Previously has been to [place] in May 1996. No mold exposure recently. No bird exposure. Does have multiple pets.    Social Determinants of Health   Financial Resource Strain: Not on file  Food Insecurity: Not on file  Transportation Needs:  Not on file  Physical Activity: Not on file  Stress: Not on file  Social Connections: Not on file  Intimate Partner Violence: Not on file    Past Medical History, Surgical history, Social history, and Family history were reviewed and updated as appropriate.   Please see review of systems for further details on the patient's review from today.   Objective:   Physical Exam:  LMP  (LMP Unknown) Comment: last period ---  1 year ago (?)  Physical Exam Constitutional:      General: She is not in acute distress. Musculoskeletal:        General: No deformity.  Neurological:     Mental Status: She is alert and oriented to person, place, and time.     Cranial Nerves: No dysarthria.     Coordination: Coordination normal.  Psychiatric:  Attention and Perception: Attention and perception normal. She does not perceive auditory or visual hallucinations.        Mood and Affect: Mood is anxious and depressed. Affect is not labile, blunt, angry, tearful or inappropriate.        Speech: Speech normal.        Behavior: Behavior normal. Behavior is cooperative.        Thought Content: Thought content normal. Thought content is not paranoid or delusional. Thought content does not include homicidal or suicidal ideation. Thought content does not include homicidal or suicidal plan.        Cognition and Memory: Cognition and memory normal.        Judgment: Judgment normal.     Comments: Insight intact Depression no better     Lab Review:     Component Value Date/Time   NA 141 04/06/2020 0939   NA 140 12/11/2016 1236   K 4.7 04/06/2020 0939   K 4.2 12/11/2016 1236   CL 106 04/06/2020 0939   CO2 28 04/06/2020 0939   CO2 24 12/11/2016 1236   GLUCOSE 159 (H) 04/06/2020 0939   GLUCOSE 109 12/11/2016 1236   BUN 16 04/06/2020 0939   BUN 15.1 12/11/2016 1236   CREATININE 0.70 04/06/2020 0939   CREATININE 0.68 02/22/2020 1649   CREATININE 0.8 12/11/2016 1236   CALCIUM 10.1 04/06/2020 0939    CALCIUM 10.0 12/11/2016 1236   PROT 6.8 04/06/2020 0939   PROT 7.5 12/11/2016 1236   ALBUMIN 4.4 04/06/2020 0939   ALBUMIN 4.4 12/11/2016 1236   AST 18 04/06/2020 0939   AST 38 (H) 12/11/2016 1236   ALT 32 04/06/2020 0939   ALT 61 (H) 12/11/2016 1236   ALKPHOS 53 04/06/2020 0939   ALKPHOS 71 12/11/2016 1236   BILITOT 0.7 04/06/2020 0939   BILITOT 0.62 12/11/2016 1236   GFRNONAA 105 12/11/2018 1121   GFRAA 122 12/11/2018 1121       Component Value Date/Time   WBC 8.4 04/06/2020 0939   RBC 4.85 04/06/2020 0939   HGB 14.5 04/06/2020 0939   HGB 14.5 12/11/2016 1236   HCT 44.3 04/06/2020 0939   HCT 43.9 12/11/2016 1236   PLT 229.0 04/06/2020 0939   PLT 289 12/11/2016 1236   PLT 295 07/24/2016 1848   MCV 91.3 04/06/2020 0939   MCV 93.2 12/11/2016 1236   MCH 31.4 02/22/2020 1649   MCHC 32.6 04/06/2020 0939   RDW 14.4 04/06/2020 0939   RDW 13.5 12/11/2016 1236   LYMPHSABS 2,330 06/05/2017 1040   LYMPHSABS 2.4 12/11/2016 1236   MONOABS 0.7 12/11/2016 1236   EOSABS 692 (H) 06/05/2017 1040   EOSABS 0.7 (H) 12/11/2016 1236   BASOSABS 73 06/05/2017 1040   BASOSABS 0.0 12/11/2016 1236    Lithium Lvl  Date Value Ref Range Status  12/11/2018 0.8 0.6 - 1.2 mmol/L Final    Serum nortriptyline level on 150 mg a day was 109  Amitriptyline  Level 12/11/2018 =237  On 250 mg daily,.    No results found for: PHENYTOIN, PHENOBARB, VALPROATE, CBMZ   .res Assessment: Plan:    Severe recurrent major depression without psychotic features ([place]) - Plan: Lithium level, Vilazodone HCl (VIIBRYD) 40 MG TABS, lithium carbonate (LITHOBID) 300 MG CR tablet  Generalized anxiety disorder - Plan: Vilazodone HCl (VIIBRYD) 40 MG TABS  Hypersomnolence  Obstructive sleep apnea  Low vitamin D level  Diffuse pain   Carissa has chronic severe major depression and generalized anxiety that is treatment  resistant and failed multiple medications ECT and TMS as noted above including SSRIs, try  cyclic's, lithium, and atypical antipsychotics for depression.. She has had partial benefit from amitriptyline plus lithium.  However she is having tremor problems from the lithium.  Efforts to reduce the lithium have led to worsening psychiatric symptoms.  There has been a discussion about possible bipolar elements but she has no clear manic episodes unrelated to medication changes.  She has features of atypical depression.  So an MAO inhibitor is attractive from that perspective.  Discussed Spravato option in detail.  We will discuss it again at follow-up appointment.  She was given information to read in the interim and the treatment plan was discussed in terms of frequency and side effects.  She is more open to this now.  Therefore consideration could be given to other trials of MAO inhibitors as they are classically more effective than other antidepressants and atypical depression.  She did fail a trial of Emsam.  Consideration could be given to using tablet version of selegiline and going to much higher doses or the use of Parnate or Nardil.. Other option for TRD not tried is Symbyax but weight gain risk.  Option of adding olanzapine to current antidepressant but she reports she does not check her blood sugar very often and that would not be a good scenario with olanzapine.  Statistically speaking the most potentially beneficial option would be Parnate or Nardil but they are difficult to use   Need to take Viibryd 60 with food emphasized.  She's improving but not consistent.  We will check lithium level  Rec counseling.  Last did April 2019 after a hospitalization. She's uncommitted.  Encourage physical activity and weight loss.  She is not particularly motivated and feels that is hard to accomplish because of chronic pain.  Chronic pain complicates her treatment of depression.  Recent B12 deficiency dx and treatment just started.  Hold B6 for lithium tremor. 500 mg BID. If needed.  Tremor  is better.  None currently.  Try to take lithium also 4-5 hours before sleep to minimize daytime tremor. depression was worse after reducing the lithium.  Irritability was also worse after reducing the lithium to 600 mg daily.   Pain worse off amitriptyline.  Can reduce to  amitriptyline 25 for pain if 50 mg remains too sedating.  Is sleeping better.  No sig risk serotonin syndrome with this low of a dose.  History of low vitamin D recently recurred and started OTC vitamin D.  Follow-up 8 weeks  Deborah Parents MD, [name]  Future Appointments  Date Time Provider [place]  04/25/2020  9:15 AM LBCT-CT 1 LBCT-CT LB-CT [facility]  05/03/2020  8:40 AM Vivi Barrack, MD [facility]  05/04/2020  5:20 PM GI-315 MR 3 GI-315MRI GI-315 [facility]. [facility]  05/04/2020  6:00 PM GI-315 MR 3 GI-315MRI GI-315 [facility]. [facility]  05/18/2020  9:00 AM Cottle, Billey Co., MD CP-CP None    Orders Placed This Encounter  Procedures  . Lithium level      -------------------------------

## 2020-04-25 ENCOUNTER — Ambulatory Visit (INDEPENDENT_AMBULATORY_CARE_PROVIDER_SITE_OTHER)
Admission: RE | Admit: 2020-04-25 | Discharge: 2020-04-25 | Disposition: A | Discharge status: Home / Self Care | Payer: Self-pay | Source: Ambulatory Visit | Attending: Family Medicine | Admitting: Family Medicine

## 2020-04-25 ENCOUNTER — Other Ambulatory Visit: Payer: Self-pay

## 2020-04-25 DIAGNOSIS — R079 Chest pain, unspecified: Secondary | ICD-10-CM

## 2020-04-25 NOTE — Progress Notes (Signed)
Please inform patient of the following:  Her CT scan came back at the 87th percentile which place her at high risk for future cardiac issues. Recommend we place a referral for her to see a cardiologist to discuss her chest pain.  Deborah Soto. [name], MD 04/25/2020 2:34 PM

## 2020-04-26 DIAGNOSIS — E7800 Pure hypercholesterolemia, unspecified: Secondary | ICD-10-CM | POA: Diagnosis not present

## 2020-04-26 DIAGNOSIS — D6861 Antiphospholipid syndrome: Secondary | ICD-10-CM | POA: Diagnosis not present

## 2020-04-26 DIAGNOSIS — G5621 Lesion of ulnar nerve, right upper limb: Secondary | ICD-10-CM | POA: Diagnosis not present

## 2020-04-26 DIAGNOSIS — G4733 Obstructive sleep apnea (adult) (pediatric): Secondary | ICD-10-CM | POA: Diagnosis not present

## 2020-04-26 DIAGNOSIS — E1165 Type 2 diabetes mellitus with hyperglycemia: Secondary | ICD-10-CM | POA: Diagnosis not present

## 2020-04-27 ENCOUNTER — Other Ambulatory Visit: Payer: Self-pay | Admitting: *Deleted

## 2020-04-27 ENCOUNTER — Other Ambulatory Visit: Payer: Self-pay | Admitting: Family Medicine

## 2020-04-27 DIAGNOSIS — F332 Major depressive disorder, recurrent severe without psychotic features: Secondary | ICD-10-CM | POA: Diagnosis not present

## 2020-04-27 DIAGNOSIS — R079 Chest pain, unspecified: Secondary | ICD-10-CM

## 2020-05-03 ENCOUNTER — Ambulatory Visit: Payer: BC Managed Care – PPO | Admitting: Family Medicine

## 2020-05-03 DIAGNOSIS — Z0289 Encounter for other administrative examinations: Secondary | ICD-10-CM

## 2020-05-03 DIAGNOSIS — E559 Vitamin D deficiency, unspecified: Secondary | ICD-10-CM | POA: Insufficient documentation

## 2020-05-03 NOTE — Progress Notes (Deleted)
   Deborah Soto is a 55 y.o. female who presents today for an office visit.  Assessment/Plan:  New/Acute Problems: ***  Chronic Problems Addressed Today: No problem-specific Assessment & Plan notes found for this encounter.     Subjective:  HPI:  ***        Objective:  Physical Exam: LMP  (LMP Unknown) Comment: last period ---  1 year ago (?)  Gen: No acute distress, resting comfortably*** CV: Regular rate and rhythm with no murmurs appreciated Pulm: Normal work of breathing, clear to auscultation bilaterally with no crackles, wheezes, or rhonchi Neuro: Grossly normal, moves all extremities Psych: Normal affect and thought content      Deborah Soto M. [name], MD 05/03/2020 8:33 AM

## 2020-05-04 ENCOUNTER — Ambulatory Visit
Admission: RE | Admit: 2020-05-04 | Discharge: 2020-05-04 | Disposition: A | Discharge status: Home / Self Care | Payer: BC Managed Care – PPO | Source: Ambulatory Visit | Attending: Family Medicine | Admitting: Family Medicine

## 2020-05-04 ENCOUNTER — Other Ambulatory Visit: Payer: Self-pay

## 2020-05-04 DIAGNOSIS — J029 Acute pharyngitis, unspecified: Secondary | ICD-10-CM | POA: Diagnosis not present

## 2020-05-04 DIAGNOSIS — M4802 Spinal stenosis, cervical region: Secondary | ICD-10-CM | POA: Diagnosis not present

## 2020-05-04 DIAGNOSIS — M549 Dorsalgia, unspecified: Secondary | ICD-10-CM

## 2020-05-04 DIAGNOSIS — M545 Low back pain, unspecified: Secondary | ICD-10-CM | POA: Diagnosis not present

## 2020-05-05 NOTE — Progress Notes (Signed)
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 Recommend she follow up with sports medicine or we can refer her to see neurosurgery if she wishes.  Deborah Soto. [name], MD 05/05/2020 10:36 AM

## 2020-05-09 ENCOUNTER — Telehealth (INDEPENDENT_AMBULATORY_CARE_PROVIDER_SITE_OTHER): Payer: BC Managed Care – PPO | Admitting: Family Medicine

## 2020-05-09 ENCOUNTER — Encounter: Payer: Self-pay | Admitting: Family Medicine

## 2020-05-09 ENCOUNTER — Other Ambulatory Visit: Payer: Self-pay

## 2020-05-09 ENCOUNTER — Other Ambulatory Visit: Payer: Self-pay | Admitting: Psychiatry

## 2020-05-09 ENCOUNTER — Ambulatory Visit: Payer: BC Managed Care – PPO | Admitting: Psychiatry

## 2020-05-09 VITALS — Ht 68.0 in | Wt 240.0 lb

## 2020-05-09 DIAGNOSIS — M5010 Cervical disc disorder with radiculopathy, unspecified cervical region: Secondary | ICD-10-CM

## 2020-05-09 DIAGNOSIS — R059 Cough, unspecified: Secondary | ICD-10-CM

## 2020-05-09 MED ORDER — GABAPENTIN 300 MG PO CAPS: ORAL_CAPSULE | ORAL | 3 refills | Status: DC

## 2020-05-09 MED ORDER — ALBUTEROL SULFATE HFA 108 (90 BASE) MCG/ACT IN AERS: 2.0000 | INHALATION_SPRAY | Freq: Four times a day (QID) | RESPIRATORY_TRACT | 0 refills | Status: DC | PRN

## 2020-05-09 MED ORDER — AZITHROMYCIN 250 MG PO TABS: ORAL_TABLET | ORAL | 0 refills | Status: DC

## 2020-05-09 NOTE — Progress Notes (Signed)
   Deborah Soto is a 55 y.o. female who presents today for a virtual office visit.   Assessment/Plan:  New/Acute Problems: Cough Likely viral URI.  Likely RSV given known sick contact.  No red flags.  Will start albuterol inhaler as this is worked well for her in the past.  We will also give pocket prescription for azithromycin with instruction to not start unless symptoms worsen.  Encourage good oral hydration.  She will let me know if symptoms are not improving.  Chronic Problems Addressed Today: Cervical disc disorder with radiculopathy of cervical region Symptoms still not well controlled. Will increase her dose of gabapentin to 600mg  in the morning, 300mg  in the middle of the day, and 600mg  at night. She will follow up with orthopedics soon. Has not tolerated tramadol in the past.      Subjective:  HPI:  History provided by patient and her husband.  Patient has been sick for the last couple of days.  Both her husband and daughter have recently had upper respiratory infections.  Husband was diagnosed with RSV.  She has had a lot of cough and mucus buildup.  No over-the-counter treatments tried.  Symptoms seem to be getting worse.  No fevers or chills.       Objective/Observations  Physical Exam: Gen: NAD, resting comfortably Pulm: Normal work of breathing Neuro: Grossly normal, moves all extremities Psych: Normal affect and thought content  Virtual Visit via Video   I connected with on 05/09/20 at  1:00 PM EST by a video enabled telemedicine application and verified that I am speaking with the correct person using two identifiers. The limitations of evaluation and management by telemedicine and the availability of in person appointments were discussed. The patient expressed understanding and agreed to proceed.   Patient location: Home Provider location: [facility] Persons participating in the virtual visit: Myself and Patient      [name]. 05/11/20, MD 05/09/2020 1:31 PM

## 2020-05-09 NOTE — Assessment & Plan Note (Signed)
Symptoms still not well controlled. Will increase her dose of gabapentin to 600mg  in the morning, 300mg  in the middle of the day, and 600mg  at night. She will follow up with orthopedics soon. Has not tolerated tramadol in the past.

## 2020-05-10 ENCOUNTER — Other Ambulatory Visit: Payer: Self-pay | Admitting: Psychiatry

## 2020-05-11 ENCOUNTER — Other Ambulatory Visit: Payer: Self-pay

## 2020-05-11 ENCOUNTER — Ambulatory Visit (INDEPENDENT_AMBULATORY_CARE_PROVIDER_SITE_OTHER): Payer: BC Managed Care – PPO | Admitting: Family Medicine

## 2020-05-11 ENCOUNTER — Telehealth (INDEPENDENT_AMBULATORY_CARE_PROVIDER_SITE_OTHER): Payer: BC Managed Care – PPO | Admitting: Physician Assistant

## 2020-05-11 ENCOUNTER — Encounter: Payer: Self-pay | Admitting: Physician Assistant

## 2020-05-11 ENCOUNTER — Encounter: Payer: Self-pay | Admitting: Family Medicine

## 2020-05-11 DIAGNOSIS — M5412 Radiculopathy, cervical region: Secondary | ICD-10-CM | POA: Diagnosis not present

## 2020-05-11 DIAGNOSIS — M5010 Cervical disc disorder with radiculopathy, unspecified cervical region: Secondary | ICD-10-CM

## 2020-05-11 MED ORDER — HYDROCODONE-ACETAMINOPHEN 5-325 MG PO TABS
1.0000 | ORAL_TABLET | Freq: Four times a day (QID) | ORAL | 0 refills | Status: DC | PRN
Start: 2020-05-11 — End: 2020-05-27

## 2020-05-11 NOTE — Patient Instructions (Signed)
Someone from Dr. Zollie Pee office will contact you today to set up an appointment for an injection to hopefully help with your pain.

## 2020-05-11 NOTE — Patient Instructions (Addendum)
Thank you for coming in today.  Plan for epidural steroid injection.   STOP xeralto 24 hours prior to injection and stay off for 72 hours after the injection  Use hydrocodone sparingly.   Let me know how this goes.

## 2020-05-11 NOTE — Progress Notes (Signed)
Virtual Visit  I connected with   Deborah Soto  by a phone telemedicine application and verified that I am speaking with the correct person using two identifiers.   I discussed the limitations of evaluation and management by telemedicine and the availability of in person appointments. The patient expressed understanding and agreed to proceed.  ? Location of the provider office ? Location of the patient home ? The names and roles of all persons participating in the visit.  Patient and myself   History of Present Illness: Deborah Soto is a 55 y.o. female who would like to discuss shoulder pain and arm pain   Pain across the superior portion of the left shoulder and into the upper back. The pain does down into the arm into the thumb and digits 2 and 3.  She denies any weakness but notes the pain is quite severe at times.  She has been prescribed prednisone which helped a little and gabapentin which does not help much at all.  Pain is obnoxious and interfering with sleep and her ability to work normally.  She is currently trying physical therapy which is not helping very much at all.  MRI completed recently which did show some potential for cervical nerve impingement.  Observations/Objective: LMP  (LMP Unknown) Comment: last period ---  1 year ago (?) Exam: Normal Speech.    Lab and Radiology Results EXAM: MRI CERVICAL SPINE WITHOUT CONTRAST  TECHNIQUE: Multiplanar, multisequence MR imaging of the cervical spine was performed. No intravenous contrast was administered.  COMPARISON:  Cervical spine MRI 05/10/2015. Cervical spine radiographs 03/14/2020  FINDINGS: Alignment: Mild retrolisthesis C3-4. 3 mm retrolisthesis C5-6. Straightening of the cervical lordosis  Vertebrae: Negative for fracture or mass.  Cord: Normal signal and morphology  Posterior Fossa, vertebral arteries, paraspinal tissues: Negative  Disc levels:  C2-3: Negative  C3-4: Moderate to  severe right foraminal encroachment due to uncinate spurring. Left foramen patent. Spinal canal patent  C4-5: Moderate right foraminal encroachment due to facet hypertrophy and uncinate spurring. Left foramen patent.  C5-6: Disc degeneration with diffuse uncinate spurring. Possible right foraminal disc protrusion. Bilateral facet hypertrophy. Moderate foraminal encroachment bilaterally right greater than left. Mild spinal stenosis  C6-7: Disc degeneration with diffuse uncinate spurring. Mild foraminal narrowing bilaterally  C7-T1: Negative  IMPRESSION: Multilevel degenerative change throughout the cervical spine as described above  Significant right foraminal encroachment C3-4, C4-5, and C5-6 due to spurring.   Electronically Signed   By: Franchot Gallo M.D.   On: 05/05/2020 09:07  I, Lynne Leader, personally (independently) visualized and performed the interpretation of the images attached in this note.   Assessment and Plan: 55 y.o. female with left shoulder and arm pain thought to be due to cervical radiculopathy at C6 and possibly C7.  Plan for epidural steroid injection.  Patient is already trying and failing typical conservative management strategies otherwise.  Limited hydrocodone for pain control.  Of note she will need to stop her Xarelto 24 hours prior to in 72 hours subsequent to epidural steroid injection.  Recheck following epidural steroid injection especially if not improved.  Recommend home Covid test.  PDMP reviewed during this encounter. Orders Placed This Encounter  Procedures  . DG INJECT DIAG/THERA/INC NEEDLE/CATH/PLC EPI/LUMB/SAC W/IMG    Standing Status:   Future    Standing Expiration Date:   05/11/2021    Order Specific Question:   Reason for Exam (SYMPTOM  OR DIAGNOSIS REQUIRED)    Answer:   left cervical  radiculopathy level and technique per radiology    Order Specific Question:   Is the patient pregnant?    Answer:   No    Order Specific  Question:   Preferred Imaging Location?    Answer:   [id number]    Order Specific Question:   Radiology Contrast Protocol - do NOT remove file path    Answer:   [email or web address]   Meds ordered this encounter  Medications  . HYDROcodone-acetaminophen (NORCO/VICODIN) 5-325 MG tablet    Sig: Take 1 tablet by mouth every 6 (six) hours as needed.    Dispense:  10 tablet    Refill:  0    Follow Up Instructions:    I discussed the assessment and treatment plan with the patient. The patient was provided an opportunity to ask questions and all were answered. The patient agreed with the plan and demonstrated an understanding of the instructions.   The patient was advised to call back or seek an in-person evaluation if the symptoms worsen or if the condition fails to improve as anticipated.  Time: 21 mins discussion    Historical information moved to improve visibility of documentation.  Past Medical History:  Diagnosis Date  . Anxiety   . Asthma   . Depression   . Diabetes mellitus type 2 in obese (HCC) 06/11/2016  . Embolism - blood clot January 1997 & January 2017   Also had LLE DVT in 1997  . Hypertension   . Preeclampsia 10/10/2011   1999   . Sleep apnea    Past Surgical History:  Procedure Laterality Date  . KIDNEY STONE SURGERY     Social History   Tobacco Use  . Smoking status: Former Smoker    Packs/day: 1.00    Years: 20.00    Pack years: 20.00    Quit date: 03/12/2004    Years since quitting: 16.1  . Smokeless tobacco: Never Used  Substance Use Topics  . Alcohol use: Yes    Alcohol/week: 1.0 standard drink    Types: 1 Cans of beer per week    Comment: 1-2 times a week   family history includes Arthritis in her mother; Clotting disorder in her maternal aunt, maternal grandmother, mother, and sister; Dementia in her father; Heart disease in her father.  Medications: Current Outpatient Medications  Medication  Sig Dispense Refill  . HYDROcodone-acetaminophen (NORCO/VICODIN) 5-325 MG tablet Take 1 tablet by mouth every 6 (six) hours as needed. 10 tablet 0  . albuterol (VENTOLIN HFA) 108 (90 Base) MCG/ACT inhaler Inhale 2 puffs into the lungs every 6 (six) hours as needed for wheezing or shortness of breath. 8 g 0  . atorvastatin (LIPITOR) 10 MG tablet TAKE 1 TABLET ONCE DAILY. 30 tablet 0  . azithromycin (ZITHROMAX) 250 MG tablet Take 2 tabs day 1, then 1 tab daily 6 each 0  . celecoxib (CELEBREX) 200 MG capsule TAKE 1 CAPSULE TWICE DAILY FOR CHRONIC PAIN. 180 capsule 0  . Cyanocobalamin (B-12 COMPLIANCE INJECTION) 1000 MCG/ML KIT One Injection weekly for 4 weeks then one injection monthly for 3 month 7 kit 0  . doxycycline (VIBRAMYCIN) 100 MG capsule Take 100 mg by mouth daily.    . gabapentin (NEURONTIN) 300 MG capsule Take 2 capsules in the morning, 1 in the middle of the day, and 2 at night. 150 capsule 3  . glipiZIDE (GLUCOTROL XL) 10 MG 24 hr tablet TAKE 1 TABLET BY MOUTH TWICE DAILY WITH A MEAL. 180 tablet 0  .   hydrOXYzine (ATARAX/VISTARIL) 25 MG tablet Take 1 tablet (25 mg total) by mouth 3 (three) times daily as needed. 30 tablet 0  . JARDIANCE 25 MG TABS tablet Take 25 mg by mouth daily.    . lithium carbonate (LITHOBID) 300 MG CR tablet Take 3 tablets (900 mg total) by mouth at bedtime. 90 tablet 3  . pantoprazole (PROTONIX) 40 MG tablet TAKE 1 TABLET ONCE DAILY. 30 tablet 0  . propranolol (INDERAL) 40 MG tablet 1-2 tablets three times daily as needed for anxiety 150 tablet 0  . SAXENDA 18 MG/3ML SOPN Inject into the skin.    . tiZANidine (ZANAFLEX) 4 MG tablet Take 1 tablet (4 mg total) by mouth every 6 (six) hours as needed for muscle spasms. 30 tablet 1  . tretinoin (RETIN-A) 0.025 % cream Apply 1 application topically at bedtime.    . Vilazodone HCl (VIIBRYD) 40 MG TABS Take 1.5 tablets (60 mg total) by mouth daily. 45 tablet 1  . XARELTO 20 MG TABS tablet TAKE 1 TABLET IN THE EVENING WITH  MEAL. 30 tablet 0   No current facility-administered medications for this visit.   Allergies  Allergen Reactions  . Tramadol Other (See Comments)    Tingling all over                                                                       

## 2020-05-11 NOTE — Progress Notes (Signed)
Virtual Visit via Video Note  I connected with Deborah Soto on 05/11/20 at  4:00 PM EST by a video enabled telemedicine application and verified that I am speaking with the correct person using two identifiers.  Location: Patient: home Provider: [facility] at [facility] [employer] Persons present: Patient, Husband Deborah Soto, and Myself   I discussed the limitations of evaluation and management by telemedicine and the availability of in person appointments. The patient expressed understanding and agreed to proceed.   History of Present Illness: Patient presents today for virtual video visit with her husband for her cervical disc disorder with radiculopathy symptoms.  She saw Dr. [name] 2 days ago and he had increased her gabapentin to 600 mg in the morning, 300 mg in the middle of the day, and 600 mg in the evening.  Patient and her husband report that this has done absolutely nothing for her pain and it seems to be getting worse.  The pain from her cervical region goes down her right arm.  They are requesting other options.  She will not take tramadol and she will not take steroids.  They are hoping I can get an urgent referral into orthopedics or sports medicine for an injection until she has an appointment with Dr. Ave Filter on 05-27-20 for orthopedic surgery consult.  They report no new injury, no new weakness, no other new symptoms, just pain that will not improve.   Observations/Objective: No vitals obtained at home.   Gen: Awake, alert, no acute distress Resp: Breathing is even and non-labored Psych: calm/pleasant demeanor, flat affect, seems frustrated from her pain Neuro: Alert and Oriented x 3, + facial symmetry, speech is clear.   Assessment and Plan: 1. Cervical disc disorder with radiculopathy of cervical region I discussed the patient and her concerns with Dr. [name] today.  I was then able to have my CMA contact Dr. Denyse Amass with sports medicine.  His office has seen the  patient before and is going to call her to hopefully get her in today or tomorrow for an acute visit to address her pain to hopefully hold her over until her appointment with Dr. Ave Filter.  Worse case scenario her pain continues to worsen she will need to go to the emergency department.    Follow Up Instructions:  I discussed the assessment and treatment plan with the patient. The patient was provided an opportunity to ask questions and all were answered. The patient agreed with the plan and demonstrated an understanding of the instructions.   The patient was advised to call back or seek an in-person evaluation if the symptoms worsen or if the condition fails to improve as anticipated.  Kooper Godshall M Latonja Bobeck, PA-C

## 2020-05-12 ENCOUNTER — Other Ambulatory Visit: Payer: Self-pay | Admitting: Family Medicine

## 2020-05-12 ENCOUNTER — Telehealth: Payer: Self-pay

## 2020-05-12 ENCOUNTER — Encounter: Payer: Self-pay | Admitting: Psychiatry

## 2020-05-12 NOTE — Telephone Encounter (Signed)
Patient stated she will call Dr.Corey office to have them complete form

## 2020-05-12 NOTE — Telephone Encounter (Signed)
Patient is calling in stating in order for him to get the injection, he would have to sign a form that she is able to stop the XARELTO 20 MG TABS tablet. Did advise patient that Dr.Parker is out until Monday, to which patient started to get upset because she is in so much pain and the pain medicine has not helped.

## 2020-05-12 NOTE — Telephone Encounter (Signed)
Patient called stating that she will not be able to get an epidural due to needing the form completed from Dr [name] and she has not been off of Xarelto long enough. She said that in the past she had 3 injections in her back around the shoulder blade at [facility] She said that this did seem to help and asked if this would be an option? She has the office notes from that visit and said she would upload them to MyChart.

## 2020-05-16 ENCOUNTER — Other Ambulatory Visit: Payer: Self-pay

## 2020-05-16 MED ORDER — AMITRIPTYLINE HCL 50 MG PO TABS: 50.0000 mg | ORAL_TABLET | Freq: Every day | ORAL | 0 refills | Status: DC

## 2020-05-17 ENCOUNTER — Other Ambulatory Visit: Payer: Self-pay

## 2020-05-17 ENCOUNTER — Telehealth: Payer: Self-pay

## 2020-05-17 ENCOUNTER — Ambulatory Visit
Admission: RE | Admit: 2020-05-17 | Discharge: 2020-05-17 | Disposition: A | Discharge status: Home / Self Care | Payer: BC Managed Care – PPO | Source: Ambulatory Visit | Attending: Family Medicine | Admitting: Family Medicine

## 2020-05-17 DIAGNOSIS — M5412 Radiculopathy, cervical region: Secondary | ICD-10-CM

## 2020-05-17 DIAGNOSIS — M47812 Spondylosis without myelopathy or radiculopathy, cervical region: Secondary | ICD-10-CM | POA: Diagnosis not present

## 2020-05-17 MED ORDER — IOPAMIDOL (ISOVUE-M 300) INJECTION 61%
1.0000 mL | Freq: Once | INTRAMUSCULAR | Status: AC
  Administered 2020-05-17: 1 mL via EPIDURAL

## 2020-05-17 MED ORDER — TRIAMCINOLONE ACETONIDE 40 MG/ML IJ SUSP (RADIOLOGY)
60.0000 mg | Freq: Once | INTRAMUSCULAR | Status: AC
  Administered 2020-05-17: 60 mg via EPIDURAL

## 2020-05-17 NOTE — Discharge Instructions (Signed)

## 2020-05-17 NOTE — Telephone Encounter (Signed)
Pt wants to know if there is any medication that can be sent in to help with her shoulder pain. She received the injection today, but was told it will take 3-5 days to help with pain . She is wanting something to help with pain til then.

## 2020-05-18 ENCOUNTER — Telehealth: Payer: Self-pay

## 2020-05-18 ENCOUNTER — Ambulatory Visit: Payer: BC Managed Care – PPO | Admitting: Psychiatry

## 2020-05-18 NOTE — Telephone Encounter (Signed)
Please advise 

## 2020-05-18 NOTE — Telephone Encounter (Signed)
Patient called back into the office this morning requesting a non opioid pain medication and not wishing to speak with Francena Hanly.    I asked patient if she had followed up with Sports Med.  Patient stated that she had but that their office was not doing anything for her.  I informed patient that [facility] had replied to her my chart message requesting her to follow back up on her pain on 05/17/20.  Patient states she did not see the my chart response.    I reached out to [facility] to follow up with patient in regard to her pain.    [facility] has reached out to patient leaving her a detail VM in regard.

## 2020-05-18 NOTE — Telephone Encounter (Signed)
Looks like Dr Denyse Amass gave the injection - she needs to follow up with them.  Deborah Soto. [name], MD 05/18/2020 8:37 AM

## 2020-05-18 NOTE — Telephone Encounter (Signed)
We got a teams message from Tammy at [facility] stating that pt had call their office and was upset and in a lot of pain. I attempted to contact pt, however there was no answer. I left a VM stating to call us back to get some additional information about what's going on. I also advised that if she was in severe pain and it was a medical emergency, she needed to get to the ED.

## 2020-05-18 NOTE — Telephone Encounter (Signed)
LVM need to F/U with Dr Denyse Amass if any question please give [name] a call

## 2020-05-19 ENCOUNTER — Other Ambulatory Visit: Payer: Self-pay

## 2020-05-19 ENCOUNTER — Encounter: Payer: Self-pay | Admitting: Family Medicine

## 2020-05-19 ENCOUNTER — Ambulatory Visit: Payer: BC Managed Care – PPO | Admitting: Family Medicine

## 2020-05-19 VITALS — BP 132/86 | HR 85 | Temp 97.9°F | Wt 236.2 lb

## 2020-05-19 DIAGNOSIS — F32A Depression, unspecified: Secondary | ICD-10-CM

## 2020-05-19 DIAGNOSIS — L03114 Cellulitis of left upper limb: Secondary | ICD-10-CM

## 2020-05-19 DIAGNOSIS — E1169 Type 2 diabetes mellitus with other specified complication: Secondary | ICD-10-CM | POA: Diagnosis not present

## 2020-05-19 DIAGNOSIS — T380X5A Adverse effect of glucocorticoids and synthetic analogues, initial encounter: Secondary | ICD-10-CM

## 2020-05-19 DIAGNOSIS — M5010 Cervical disc disorder with radiculopathy, unspecified cervical region: Secondary | ICD-10-CM | POA: Diagnosis not present

## 2020-05-19 DIAGNOSIS — E669 Obesity, unspecified: Secondary | ICD-10-CM

## 2020-05-19 DIAGNOSIS — F332 Major depressive disorder, recurrent severe without psychotic features: Secondary | ICD-10-CM

## 2020-05-19 MED ORDER — SULFAMETHOXAZOLE-TRIMETHOPRIM 800-160 MG PO TABS: 1.0000 | ORAL_TABLET | Freq: Two times a day (BID) | ORAL | 0 refills | Status: DC

## 2020-05-19 NOTE — Patient Instructions (Signed)
Please follow up if symptoms do not improve or as needed.   Please hold the doxycycline while taking the Septra Antibiotic.  Monitor your arm for redness and fevers.  Call if worsening.   Hopefully, will start improving over the next 2-3 days.  Monitor your sugars.   It was a pleasure meeting you.

## 2020-05-19 NOTE — Progress Notes (Signed)
Subjective  CC:  Chief Complaint  Patient presents with  . Rash    Left arm near elbow, swollen and warm to touch. Did have a steroid injection on Tuesday. She does wear a freestyle libre but meter is on day 12  . Depression    Severe increase since steroid injection, states "I was ready to jump off a bridge"    Same day acute visit; PCP not available. New pt to me. Chart reviewed.  Reviewed recent Sports medicine and acute tele health visits and diabetic information.   HPI: Deborah Soto is a 55 y.o. female who presents to the office today to address the problems listed above in the chief complaint.  55 year old female with multiple medical problems including poorly controlled type 2 diabetes on insulin, mood disorder, and ongoing cervical radiculopathy for which she recently had an epidural steroid injection.  She has been suffering with ongoing pain problems for the last couple of months.  Today she presents due to warm red area on her left upper arm.  She reports she noticed it yesterday, it started at her elbow, over the last 24 hours it has spread upward toward her shoulders.  She denies elbow pain or shoulder pain.  She has had no fevers or chills.  No history of cellulitis.  No upper extremity swelling.  She has a libre meter on her left upper but there is no redness or tenderness associated with it.  The redness is away from it.  Has noted marked worsening of her depressive symptoms since receiving steroid injection.  Fortunately, she is starting to feel slightly better over the last 24 hours.  She denies suicidality now.  She reports her mood has fluctuated recently given multiple factors including medical problems and home issues.  She reports she will be with her husband over the next 5 days 24 hours/day and will not be alone.  She feels like her mood will improve  Diabetes: She is on chronic insulin and reports her most recent A1c was near 8.0 she sees a specialist.  She denies  any lower extremity wounds.  Dental infection: She currently is on doxycycline once daily for prevention.   Assessment  1. Cellulitis of left arm   2. Cervical disc disorder with radiculopathy of cervical region   3. Diabetes mellitus type 2 in obese (HCC)   4. Steroid-induced depression   5. 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  Education given.  Start Septra DS 1 pill twice a day.  Hold doxycycline in the interim.  Monitor for worsening redness, pain or fevers.  She will call if any develop.  We will leave glucometer in place as it does not seem to be part of infection at this time.  If redness or pain develops there, she will have to remove the device.  Patient understands and agrees with care plan.  Cervical disorder status post steroid injection: Per sports medicine.  Hopefully she will get some relief.  Steroid-induced depression: Lifting.  Monitor closely.  Agree with supervision from her husband and support.  Diabetes: Monitor sugars which will elevate after steroid injection.  Avoid sweets and drink plenty of water.  Call if sugars are greater than 400.  Follow up: As needed Visit date not found  No orders of the defined types were placed in this encounter.  Meds ordered this  encounter  Medications  . sulfamethoxazole-trimethoprim (BACTRIM DS) 800-160 MG tablet    Sig: Take 1 tablet by mouth 2 (two) times daily.    Dispense:  14 tablet    Refill:  0      I reviewed the patients updated PMH, FH, and SocHx.    Patient Active Problem List   Diagnosis Date Noted  . Vitamin D deficiency 05/03/2020  . Neuropathy 04/06/2020  . S/P ECT (electroconvulsive therapy) 06/02/2019  . Memory loss due to medical condition 06/02/2019  . Intention tremor 06/02/2019  . 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 Medication-induced postural  tremor 06/02/2019  . OSA on CPAP 01/26/2019  . Long term use of drug 10/08/2018  . Insomnia 10/08/2018  . Anxiety 10/08/2018  . Tremors of nervous system 10/08/2018  . Acne 01/10/2018  . Primary hypercoagulable state ([place]) 10/10/2017  . Vitamin B12 deficiency 03/18/2017  . Diffuse pain 02/22/2017  . Morbid obesity ([place]) 06/22/2016  . MDD (major depressive disorder), recurrent severe, without psychosis ([place]) 06/14/2016  . Intentional drug overdose ([place])   . Diabetes mellitus type 2 in obese ([place]) 06/11/2016  . Positive ANA (antinuclear antibody) 04/02/2016  . Hyperparathyroidism ([place]) 01/09/2016  . Snoring 09/20/2015  . Cervical disc disorder with radiculopathy of cervical region 05/12/2015  . Hepatic steatosis 04/18/2015  . Achilles tendonitis 04/08/2012  . PTE (pulmonary thromboembolism) ([place]) 10/10/2011  . Affective disorder ([place]) 10/10/2011  . AR (allergic rhinitis) 10/10/2011   Current Meds  Medication Sig  . albuterol (VENTOLIN HFA) 108 (90 Base) MCG/ACT inhaler Inhale 2 puffs into the lungs every 6 (six) hours as needed for wheezing or shortness of breath.  Marland Kitchen amitriptyline (ELAVIL) 50 MG tablet Take 1 tablet (50 mg total) by mouth at bedtime.  Marland Kitchen atorvastatin (LIPITOR) 10 MG tablet TAKE 1 TABLET ONCE DAILY.  . celecoxib (CELEBREX) 200 MG capsule TAKE 1 CAPSULE TWICE DAILY FOR CHRONIC PAIN.  Marland Kitchen Cyanocobalamin (B-12 COMPLIANCE INJECTION) 1000 MCG/ML KIT One Injection weekly for 4 weeks then one injection monthly for 3 month  . doxycycline (VIBRAMYCIN) 100 MG capsule Take 100 mg by mouth daily.  Marland Kitchen gabapentin (NEURONTIN) 300 MG capsule Take 2 capsules in the morning, 1 in the middle of the day, and 2 at night.  Marland Kitchen glipiZIDE (GLUCOTROL XL) 10 MG 24 hr tablet TAKE 1 TABLET BY MOUTH TWICE DAILY WITH A MEAL.  Marland Kitchen HYDROcodone-acetaminophen (NORCO/VICODIN) 5-325 MG tablet Take 1 tablet by mouth every 6 (six) hours as needed.  . hydrOXYzine (ATARAX/VISTARIL) 25 MG tablet Take 1 tablet (25 mg  total) by mouth 3 (three) times daily as needed.  Marland Kitchen JARDIANCE 25 MG TABS tablet Take 25 mg by mouth daily.  Marland Kitchen lithium carbonate (LITHOBID) 300 MG CR tablet Take 3 tablets (900 mg total) by mouth at bedtime.  . pantoprazole (PROTONIX) 40 MG tablet TAKE 1 TABLET ONCE DAILY.  Marland Kitchen propranolol (INDERAL) 40 MG tablet 1-2 tablets three times daily as needed for anxiety  . SAXENDA 18 MG/3ML SOPN Inject into the skin.  Marland Kitchen sulfamethoxazole-trimethoprim (BACTRIM DS) 800-160 MG tablet Take 1 tablet by mouth 2 (two) times daily.  Marland Kitchen tiZANidine (ZANAFLEX) 4 MG tablet Take 1 tablet (4 mg total) by mouth every 6 (six) hours as needed for muscle spasms.  Marland Kitchen tretinoin (RETIN-A) 0.025 % cream Apply 1 application topically at bedtime.  . Vilazodone HCl (VIIBRYD) 40 MG TABS Take 1.5 tablets (60 mg total) by mouth daily.  Alveda Reasons 20 MG TABS tablet TAKE 1 TABLET  IN THE EVENING WITH MEAL.  . [DISCONTINUED] azithromycin (ZITHROMAX) 250 MG tablet Take 2 tabs day 1, then 1 tab daily    Allergies: Patient is allergic to tramadol. Family History: Patient family history includes Arthritis in her mother; Clotting disorder in her maternal aunt, maternal grandmother, mother, and sister; Dementia in her father; Heart disease in her father. Social History:  Patient  reports that she quit smoking about 16 years ago. She has a 20.00 pack-year smoking history. She has never used smokeless tobacco. She reports current alcohol use of about 1.0 standard drink of alcohol per week. She reports that she does not use drugs.  Review of Systems: Constitutional: Negative for fever malaise or anorexia Cardiovascular: negative for chest pain Respiratory: negative for SOB or persistent cough Gastrointestinal: negative for abdominal pain  Objective  Vitals: BP 132/86   Pulse 85   Temp 97.9 F (36.6 C) (Temporal)   Wt 236 lb 3.2 oz (107.1 kg)   LMP  (LMP Unknown) Comment: last period ---  1 year ago (?)  SpO2 96%   BMI 35.91 kg/m   General: no acute distress , A&Ox3 Psych: Mood: Normal affect, appears happy.  Normal speech and cognition. Cardiovascular:  RRR without murmur or gallop.  Respiratory:  Good breath sounds bilaterally, CTAB with normal respiratory effort Skin:  Warm, left elbow with psoriatic patch, proximal to this warm erythematous areas back running from elbow to shoulder.  No fluctuance.  Nontender.  Area around [place] device is clear and nontender.     Commons side effects, risks, benefits, and alternatives for medications and treatment plan prescribed today were discussed, and the patient expressed understanding of the given instructions. Patient is instructed to call or message via MyChart if he/she has any questions or concerns regarding our treatment plan. No barriers to understanding were identified. We discussed Red Flag symptoms and signs in detail. Patient expressed understanding regarding what to do in case of urgent or emergency type symptoms.   Medication list was reconciled, printed and provided to the patient in AVS. Patient instructions and summary information was reviewed with the patient as documented in the AVS. This note was prepared with assistance of Dragon voice recognition software. Occasional wrong-word or sound-a-like substitutions may have occurred due to the inherent limitations of voice recognition software  This visit occurred during the SARS-CoV-2 public health emergency.  Safety protocols were in place, including screening questions prior to the visit, additional usage of staff PPE, and extensive cleaning of exam room while observing appropriate contact time as indicated for disinfecting solutions.

## 2020-05-20 ENCOUNTER — Encounter: Payer: Self-pay | Admitting: Family Medicine

## 2020-05-24 DIAGNOSIS — L738 Other specified follicular disorders: Secondary | ICD-10-CM | POA: Diagnosis not present

## 2020-05-24 DIAGNOSIS — L449 Papulosquamous disorder, unspecified: Secondary | ICD-10-CM | POA: Diagnosis not present

## 2020-05-24 DIAGNOSIS — I781 Nevus, non-neoplastic: Secondary | ICD-10-CM | POA: Diagnosis not present

## 2020-05-26 DIAGNOSIS — F332 Major depressive disorder, recurrent severe without psychotic features: Secondary | ICD-10-CM | POA: Diagnosis not present

## 2020-05-26 NOTE — Progress Notes (Signed)
I, Christoper Fabian, LAT, ATC, am serving as scribe for Dr. [name].  Deborah Soto is a 55 y.o. female who presents to [facility] today for f/u of R shoulder and arm pain after having a R C7-T1 interlaminar epidural on 05/17/20.   She was last seen by Dr. Denyse Amass on 05/11/20 for f/u and noted that her pain was interfering w/ sleep and her ability to work normally and was referred for a cervical ESI that she had on 3/822.  She was prescribed a limited quantity of hydrocodone-acetaminophen to help w/ her pain and she has tried prednisone and gabapentin in the past.  She was more recently seen by her PCP on 05/19/20 and was diagnosed w/ cellulitis in her L UE and prescribed  Septra DS bid.  Since her last visit w/ Dr. Denyse Amass, pt reports neck pain has returned around Wed, 3/16. Pt locates pain R c-spine into R shoulder and down arms. Numbness/tingling is constant.  Diagnostic imaging: C-spine and L-spine MRI- 05/04/20; C-spine and L-spine XR- 03/14/20  Pertinent review of systems: No fevers or chills  Relevant historical information: Patient has done a genetic test in the past which showed that she does not metabolize hydrocodone well but does metabolize tramadol and oxycodone well.   Exam:  BP 122/81 (BP Location: Right Arm, Patient Position: Sitting, Cuff Size: Normal)   Pulse 90   Wt 239 lb (108.4 kg)   LMP  (LMP Unknown) Comment: last period ---  1 year ago (?)  SpO2 97%   BMI 36.34 kg/m  General: Well Developed, well nourished, and in no acute distress.   MSK: C-spine normal-appearing decreased cervical motion.  Upper semistrength is intact.    Lab and Radiology Results  EXAM: MRI CERVICAL SPINE WITHOUT CONTRAST  TECHNIQUE: Multiplanar, multisequence MR imaging of the cervical spine was performed. No intravenous contrast was administered.  COMPARISON:  Cervical spine MRI 05/10/2015. Cervical spine radiographs 03/14/2020  FINDINGS: Alignment:  Mild retrolisthesis C3-4. 3 mm retrolisthesis C5-6. Straightening of the cervical lordosis  Vertebrae: Negative for fracture or mass.  Cord: Normal signal and morphology  Posterior Fossa, vertebral arteries, paraspinal tissues: Negative  Disc levels:  C2-3: Negative  C3-4: Moderate to severe right foraminal encroachment due to uncinate spurring. Left foramen patent. Spinal canal patent  C4-5: Moderate right foraminal encroachment due to facet hypertrophy and uncinate spurring. Left foramen patent.  C5-6: Disc degeneration with diffuse uncinate spurring. Possible right foraminal disc protrusion. Bilateral facet hypertrophy. Moderate foraminal encroachment bilaterally right greater than left. Mild spinal stenosis  C6-7: Disc degeneration with diffuse uncinate spurring. Mild foraminal narrowing bilaterally  C7-T1: Negative  IMPRESSION: Multilevel degenerative change throughout the cervical spine as described above  Significant right foraminal encroachment C3-4, C4-5, and C5-6 due to spurring.   Electronically Signed   By: Marlan Palau M.D.   On: 05/05/2020 09:07  I, [name], personally (independently) visualized and performed the interpretation of the images attached in this note.   FLUOROSCOPY TIME:  Fluoroscopy Time: 28 seconds  Radiation Exposure Index: 28.16 microGray*m^2  PROCEDURE: The procedure, risks, benefits, and alternatives were explained to the patient. Questions regarding the procedure were encouraged and answered. The patient understands and consents to the procedure.  CERVICAL EPIDURAL INJECTION  An interlaminar approach was performed on the right at C7-T1. A 3.5 inch 20 gauge epidural needle was advanced using loss-of-resistance technique.  DIAGNOSTIC EPIDURAL INJECTION  Injection of Isovue-M 300 shows a good epidural pattern with  spread above and below the level of needle placement, primarily on the right. No  vascular opacification is seen.  THERAPEUTIC EPIDURAL INJECTION  1.5 ml of Kenalog 40 mixed with 2 ml of normal saline were then instilled. The procedure was well-tolerated, and the patient was discharged thirty minutes following the injection in good condition.  IMPRESSION: Technically successful interlaminar epidural injection on the right at C7-T1.   Electronically Signed   By: Sebastian Ache M.D.   On: 05/17/2020 13:54   Gene test located under scanned lab documents.  Look date is September 05, 2016 title is Gene results under lab section. Lab reviewed   Assessment and Plan: 55 y.o. female with right cervical radiculopathy at C4-C5 and C6 levels.  Patient had epidural steroid injection in C7-T1 with mild results.  She already is feeling bad again about a week after the first shot.  Plan for repeat injection but will also refer to orthopedic spine surgery for surgical consultation at the same time.  Based on genetic testing results she is not a good metabolizer of hydrocodone so will prescribe oxycodone for pain control in the short-term.  Longer discussion today regarding potential next steps in treatment plan.   PDMP not reviewed this encounter. Orders Placed This Encounter  Procedures  . DG INJECT DIAG/THERA/INC NEEDLE/CATH/PLC EPI/LUMB/SAC W/IMG    Standing Status:   Future    Standing Expiration Date:   05/27/2021    Order Specific Question:   Reason for Exam (SYMPTOM  OR DIAGNOSIS REQUIRED)    Answer:   repear cervical epidural steroid    Order Specific Question:   Is the patient pregnant?    Answer:   No    Order Specific Question:   Preferred Imaging Location?    Answer:   [id number]    Order Specific Question:   Radiology Contrast Protocol - do NOT remove file path    Answer:   [email or web address]  . Ambulatory referral to Orthopedic Surgery    Referral Priority:   Routine    Referral Type:   Surgical    Referral  Reason:   Specialty Services Required    Referred to Provider:   Estill Bamberg, MD    Requested Specialty:   Orthopedic Surgery    Number of Visits Requested:   1   Meds ordered this encounter  Medications  . oxyCODONE-acetaminophen (PERCOCET/ROXICET) 5-325 MG tablet    Sig: Take 1 tablet by mouth every 4 (four) hours as needed for severe pain.    Dispense:  15 tablet    Refill:  0     Discussed warning signs or symptoms. Please see discharge instructions. Patient expresses understanding.   The above documentation has been reviewed and is accurate and complete [name], M.D.  Total encounter time 30 minutes including face-to-face time with the patient and, reviewing past medical record, and charting on the date of service.   Treatment plan and options and document reviewed.

## 2020-05-27 ENCOUNTER — Other Ambulatory Visit: Payer: Self-pay

## 2020-05-27 ENCOUNTER — Ambulatory Visit: Payer: BC Managed Care – PPO | Admitting: Family Medicine

## 2020-05-27 VITALS — BP 122/81 | HR 90 | Wt 239.0 lb

## 2020-05-27 DIAGNOSIS — M25511 Pain in right shoulder: Secondary | ICD-10-CM | POA: Diagnosis not present

## 2020-05-27 DIAGNOSIS — M4722 Other spondylosis with radiculopathy, cervical region: Secondary | ICD-10-CM | POA: Diagnosis not present

## 2020-05-27 DIAGNOSIS — M5412 Radiculopathy, cervical region: Secondary | ICD-10-CM

## 2020-05-27 MED ORDER — OXYCODONE-ACETAMINOPHEN 5-325 MG PO TABS: 1.0000 | ORAL_TABLET | ORAL | 0 refills | Status: DC | PRN

## 2020-05-27 NOTE — Patient Instructions (Addendum)
Thank you for coming in today.  Call or go to the ER if you develop a large red swollen joint with extreme pain or oozing puss.   You should hear soon about referral.

## 2020-05-30 ENCOUNTER — Other Ambulatory Visit: Payer: Self-pay | Admitting: Family Medicine

## 2020-05-31 ENCOUNTER — Telehealth: Payer: Self-pay

## 2020-05-31 DIAGNOSIS — F332 Major depressive disorder, recurrent severe without psychotic features: Secondary | ICD-10-CM | POA: Diagnosis not present

## 2020-05-31 NOTE — Telephone Encounter (Signed)
Patient called in wondering if we have the form that states she can stop her XARELTO 20 MG TABS tablet so she is able to get an injection tomorrow.

## 2020-05-31 NOTE — Telephone Encounter (Signed)
See below

## 2020-06-01 ENCOUNTER — Other Ambulatory Visit: Payer: Self-pay

## 2020-06-01 ENCOUNTER — Ambulatory Visit
Admission: RE | Admit: 2020-06-01 | Discharge: 2020-06-01 | Disposition: A | Discharge status: Home / Self Care | Payer: BC Managed Care – PPO | Source: Ambulatory Visit | Attending: Family Medicine | Admitting: Family Medicine

## 2020-06-01 DIAGNOSIS — M47812 Spondylosis without myelopathy or radiculopathy, cervical region: Secondary | ICD-10-CM | POA: Diagnosis not present

## 2020-06-01 DIAGNOSIS — M5412 Radiculopathy, cervical region: Secondary | ICD-10-CM

## 2020-06-01 MED ORDER — TRIAMCINOLONE ACETONIDE 40 MG/ML IJ SUSP (RADIOLOGY)
60.0000 mg | Freq: Once | INTRAMUSCULAR | Status: AC
  Administered 2020-06-01: 60 mg via EPIDURAL

## 2020-06-01 MED ORDER — IOPAMIDOL (ISOVUE-M 300) INJECTION 61%
1.0000 mL | Freq: Once | INTRAMUSCULAR | Status: AC | PRN
  Administered 2020-06-01: 1 mL via EPIDURAL

## 2020-06-02 ENCOUNTER — Other Ambulatory Visit: Payer: BC Managed Care – PPO

## 2020-06-02 NOTE — Telephone Encounter (Signed)
Left pt vm to call back

## 2020-06-07 DIAGNOSIS — F332 Major depressive disorder, recurrent severe without psychotic features: Secondary | ICD-10-CM | POA: Diagnosis not present

## 2020-06-09 ENCOUNTER — Other Ambulatory Visit: Payer: Self-pay | Admitting: Psychiatry

## 2020-06-09 DIAGNOSIS — F411 Generalized anxiety disorder: Secondary | ICD-10-CM

## 2020-06-09 DIAGNOSIS — F332 Major depressive disorder, recurrent severe without psychotic features: Secondary | ICD-10-CM

## 2020-06-09 NOTE — Telephone Encounter (Signed)
Please review.The note is not clear on the propranolol

## 2020-06-09 NOTE — Telephone Encounter (Signed)
Sorry I did not mean to send that to you!

## 2020-06-09 NOTE — Telephone Encounter (Signed)
No problem.

## 2020-06-09 NOTE — Telephone Encounter (Signed)
This is Dr. Alwyn Ren patient

## 2020-06-10 ENCOUNTER — Encounter: Payer: Self-pay | Admitting: Family Medicine

## 2020-06-14 DIAGNOSIS — F332 Major depressive disorder, recurrent severe without psychotic features: Secondary | ICD-10-CM | POA: Diagnosis not present

## 2020-06-15 DIAGNOSIS — G5622 Lesion of ulnar nerve, left upper limb: Secondary | ICD-10-CM | POA: Diagnosis not present

## 2020-06-15 DIAGNOSIS — G5621 Lesion of ulnar nerve, right upper limb: Secondary | ICD-10-CM | POA: Diagnosis not present

## 2020-06-17 ENCOUNTER — Encounter: Payer: Self-pay | Admitting: Family Medicine

## 2020-06-21 ENCOUNTER — Encounter: Payer: Self-pay | Admitting: Psychiatry

## 2020-06-21 ENCOUNTER — Other Ambulatory Visit: Payer: Self-pay

## 2020-06-21 ENCOUNTER — Ambulatory Visit (INDEPENDENT_AMBULATORY_CARE_PROVIDER_SITE_OTHER): Payer: BC Managed Care – PPO | Admitting: Psychiatry

## 2020-06-21 DIAGNOSIS — G251 Drug-induced tremor: Secondary | ICD-10-CM

## 2020-06-21 DIAGNOSIS — G4710 Hypersomnia, unspecified: Secondary | ICD-10-CM | POA: Diagnosis not present

## 2020-06-21 DIAGNOSIS — G4733 Obstructive sleep apnea (adult) (pediatric): Secondary | ICD-10-CM | POA: Diagnosis not present

## 2020-06-21 DIAGNOSIS — F332 Major depressive disorder, recurrent severe without psychotic features: Secondary | ICD-10-CM | POA: Diagnosis not present

## 2020-06-21 DIAGNOSIS — F411 Generalized anxiety disorder: Secondary | ICD-10-CM | POA: Diagnosis not present

## 2020-06-21 DIAGNOSIS — R7989 Other specified abnormal findings of blood chemistry: Secondary | ICD-10-CM

## 2020-06-21 DIAGNOSIS — R52 Pain, unspecified: Secondary | ICD-10-CM

## 2020-06-21 MED ORDER — OLANZAPINE 10 MG PO TABS: 10.0000 mg | ORAL_TABLET | Freq: Every day | ORAL | 0 refills | Status: DC

## 2020-06-21 NOTE — Progress Notes (Signed)
Deborah Soto [id number] [birth year] 55 y.o.     Subjective:   Patient ID:  Deborah Soto is a 55 y.o. (DOB [birth year]) female.  Chief Complaint:  Chief Complaint  Patient presents with  . Follow-up  . 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 Anxiety  . Sleeping Problem   Depression        Associated symptoms include fatigue and myalgias.  Associated symptoms include no decreased concentration.    Deborah Soto presents to the office today for follow-up of TRD and anxiety, and insomnia.  seen December 08, 2018.  She was complaining of lithium tremor and some cognitive problems.  Hydroxyzine was stopped.  Propranolol increased to 60 mg twice daily for tremor.  Lithium level and amitriptyline levels were requested. Amitriptyline level to 37, nortriptyline 49 on amitriptyline 250 mg nightly. Lithium level 0.800 mg daily  Phone call on October 6 to discuss lab results as follows: Note   ----- Message from [name]., MD sent at 12/15/2018 10:08 AM EDT ----- Lithium level stable at 0.8 in a good range.  The previous was 0.7.  She is having some tremor issues.  Normal BMP including excellent creatinine and calcium levels.  Blood sugar is high but she is aware of that problem.  TSH is within normal limits.  Serum amitriptyline level is 286 which is at the upper end of the normal range and suggestive that we not try further increase.  We will discuss further options at her follow-up appointment.  No medication adjustments required, but because of her tremor and her lithium level is slightly higher than it was with the prior level if she wants to reduce the lithium from 3 of the 300 mg tablets daily to 2-1/2 of the 300 mg tablets daily she can do so.  At her last appointment she was also encouraged to try a higher dose of propranolol and that may have resolved the problem.  If it did do not reduce the lithium but if it did not and she wants to  reduce the dose she can do so as noted.  She should call us if she has any recurrence of symptoms.  [name], [name]     She had repeat lithium level at 750 mg daily of 0.  7 with some improvement in tremor.  In phone call on November 20 she reported mood was worse with weepiness, overreacting, "pity party".  Because mood symptoms were worse with the reduction lithium she was encouraged to increase the dosage back to 900 mg daily.   Last seen February 04, 2019.  The following changes were made: Increase propranolol to 80 mg twice daily for lithium tremor and anxiety her blood pressure and pulse appear high enough that she can tolerate this dosage.  Disc Se.  Disc risk with low BP and DM.  He has not been having any problems with low blood sugar. Amitriptyline 6 or 300 mg daily if tolerated.  Try to take some about 4 -5 hours before sleep Try to take lithium also 4-5 hours before sleep to minimize daytime tremor Add methylphenidate ER 27 mg in the morning.  If no response we will increase the dosage.  Seen May 08, 2019 with her husband. Aside from physically feeling like she can't do much then having memory issues.  Forgetful.  Loses track of thoughts between tasks.  Most embarrassing being around people and word-finding issues.   Problems with walking bc balance and weakness.  Fears falling. Only fall at Xmas morning.  Tremor is awful.  Affects keying.   H notices cognitive problems and forgetfulness.    Shaking got really bad and got lithium level as low as 600 mg but the next day felt really helpless and everything seemed to be aimed at hurting me and felt disrespected.  Problems with boss and 54 yo taking 5 AP classes and 2 volleyball classes.  I've been on her to accomplish all these things.  She claimed that pt screaming at her.   H CO she was more irritable after the reduction in lithium.   H CO her anxiety also seems worse.  H says the last year has been unhealthy with 12 hour  work days for her.   Doesn't go out much.    Attention problems more noticeable.  Still dropping and tremor.  Tolerated the increase in propranolol.    CO shakiness from lithium which interferes with typing. Also a good bit of anxiety generally without panic.  BS are high.  NotTaking hydroxyzine.   sleeping better and still using CPAP.  Using CPAP regularly. Less awakening than before.  No SE. depression was worse after reducing the lithium.  Irritability was also worse after reducing the lithium to 600 mg daily.  seen May 08, 2019.  Multiple changes made:  Reduce propranolol to 60 mg twice daily for lithium tremor and anxiety since the increase was not helpful.   Restart B6 for lithium tremor. 500 mg BID. She forgot and doesn't want more. Try to take lithium also 4-5 hours before sleep to minimize daytime tremor. depression was worse after reducing the lithium.  Irritability was also worse after reducing the lithium to 600 mg daily. So reluctant to reduce it further.  It is unclear how to address the benefits of lithium without using lithium other than to consider Spravato Increase methylphenidate ER 36 mg in the morning. Increase methylphenidate ER 36 mg in the morning. Wait 1-2 weeks then reduce amitriptyline to 5 tablets nightly to see if cognition is better. Also saw Dr. [name] re: sleep disorder.  05/2019 appt with the following noted: Lost Rx of Concerta 36 so didn't take it long.  Out of it.  Still shakey and cognitive problems.  Dep 5/10/  Anxiety 6/10 worse at night.  No SI.  8 hours sleep.  Mild panic couple times daily. Frustrated with tremor and sleepiness which is worse than tiredness.   History of low vitamin D taking 150K/week but stopped 6 mos ago. Plan : Modafinil 100 mg tablet 1 dailly for 1 week, Then add Concerta 36 mg 1 each AM.  Option increase modafinil mid April  Restart B6 for lithium tremor. 500 mg BID. Get lab test at earliest convience  10/14/2019  appointment with the following noted: Has been on amitriptyline 250 mg for mos.  Tried a week ago reducing to 200 and felt worse so back on 250. Weaned off Lyrica about a month ago.  Not much change off it.   Not sure if it helped energy or alertness. Saw Dr. Cardell Peach in consultation. Mental clarity is a lot better with stimulants and not sleeping as much in daytime with current meds.  Severe drowsiness is a lot better.   Off metformin and added Glipizide.   Not great with depression 5/10.  Anxiety can trigger problems with thinking and somatic sx   I feel like it's something physical and can keep her up at night. Occ racing heart or  feeling hot.  Caffeine variable.  Plan:Plan: Increase methylphenidate from 36 to 54 mg in the morning. Increase amitriptyline to 6 tablets in the evening    11/12/19 TC noting call: Pt called stated she's not feeling well at all. Ask for sooner apt than 10/7. Depression, anxiety & focus is much worse. Has not planned to hurt herself but thoughts of things would be better if I was not here have crossed her mind often. Contact ASAP @ [phone].  I put Pt on canc list  MD response: I reviewed the meds and previous med lists.  The next options are either Spravato or a major med change from amitriptyline which is not something I can do outside of an appt.  She's on cancellation list and we'll try to work her in sooner.  Nothing I can change right now except since the increase in amitriptyline has not helped, she can reduce it to 200 mg nightly in preparation for other changes to come. Nursing response after talking with pt: Discussed symptoms and medications with patient, she did not increase her Amitriptyline 50 mg up to 6 tablets so she will start that today. Said you may have told her that but she didn't remember. She has read about Spravato but she is not wanting to proceed with that. Informed her she's on cancellation list as well. She will call back with worsening signs or  symptoms.  11/23/19 appt with the following noted: Forgot to increase amitriptyline until noted. Tolerating it OK but hasn't had time to help. More alert with Concerta increase to 54 mg daily also with modafinil.  A little more focused and better productivity.  Still forgetful.   No SE noted except GI issues from diarrhea to constipation.   Still miserable and cry easily.  Try to numb herself.  Always tired especially emotionally.  Anxiety is high and predates current stimulants. No therapy since [facility].  She felt he pushed too hard for her to make changes.  I don't feel like I can deal with him right now. Plan: Continue meds & The increase in amitriptyline to 300 mg daily needs more time.  01/12/2020 appointment with the following noted: Not well.  Mood horrible.  Easily frustrated sometimes without reason and may nap or cry.  Anxiety is pretty bad.  Daytime sleepiness seems worse.  Can get nausea from anxiety.  Feels like function is slow.  Have to write everything down.  Hates home, job, high school volleyball program.  At some point I have to take responsibility.   Amitriptyline also not helping pain any.  Clenching jaw for 3 weeks. Likcking lips.  Picking fingers. D will play volleyball at [facility] and [place]. Plan: Reduce amitriptyline by 1 tablet every 4 days. Start Viibryd 10 mg daily with food for 7 days, then 20 mg daily for 7 days, then increase to 40 mg daily.  03/18/20 appt urgently made and noted:  Seen with husband. Still with jaw clenching day and night despite off stimulant. Now on Viibryd 60 mg since 03/02/20 and no stimulant., lithium 900 mg, and propranolol 80 BID. Rare hydroxyzine DT sleepiness, Gabapentin 300 TID. Not taking it with food.  LT sensitivity to noise and light and overstimulated and now bothering her more at work.  Always had to use shirts without tags and sensitivity to smells. Easily agitated in public per H.  Gets negative and wants to give up and not  live anymore.  She says there's less chance of acting on  it if she tells H about it and she's doing so now.  Pain worse off amitriptyline esp around ribs and general diffuse pain.  Anxiety worse with stimulants and hasn't gone away.  Trouble staying asleep. Plan: Need to take Viibryd 60 with food emphasized.  She's not currently doing so. Restart low-dose amitriptyline for pain  04/22/2020 appointment with following noted: Only restarted amitriptyline 50 mg last week.  Stays asleep better with it.  Sleep 8-9 hours now but no change in pain yet.  Sleepier in day recently. Done a lot of travel lately. Maybe viibryd making a little difference with mood and anxiety now that has been on it longer.  Needs to take Viibryd in AM bc seems to interfere with sleep. Final season of travel volleyball with good team. H notes still some irritability. Pain worse off amitriptyline.  Can reduce to  amitriptyline 25 for pain if 50 mg remains too sedating.  Is sleeping better.      06/21/2020 appointment with following noted:  Seen with H Tolerating amitriptyline 50 mg HS with decent sleep. Really crappy and as bad as 10 years.  Situationally life sucks.  Work environment is bad with coworkers.  Since Sept work is bad.  Doesn't feel people are supportive at home or work. H working 12 hour days.  When she's off wants to sleep all day.   Still in therapy with Gerald Stabs.  Is good therapist.   Past Psychiatric Medication Trials: Failed ECT and TMS,  amitriptyline helped for 2 to 3 years in 1987,   2021 No benefit from amitriptyline 300 mg with adequate duration.  Nortriptyline 200 with a therapeutic blood level. Trintellix, Emsam, duloxetine 120, Lexapro 20, Fetzima,  Wellbutrin, fluoxetine, paroxetine, sertraline,  Viibryd 60 lithium SE,  Latuda 120, Abilify 15,  Vraylar, Rexulti,Seroquel 400, risperidone, Geodon, Haldol for agitation, lamotrigine 300,  buspirone, clonazepam, gabapentin, modafinil, pramipexole,   Nuvigil, Vyvanse, Concerta SHE DOESN'T WANT STIMULANTS AGAIN bc of anxiety. Trazodone, propranolol history of overdose on Xanax, There is a history of suicide attempts and psychiatric hospitalizations.  Review of Systems:  Review of Systems  Constitutional: Positive for fatigue.  Cardiovascular: Negative for chest pain and palpitations.  Gastrointestinal: Negative for diarrhea.  Musculoskeletal: Positive for back pain and myalgias. Negative for gait problem.  Neurological: Positive for tremors and weakness. Negative for dizziness.  Psychiatric/Behavioral: Positive for depression. Negative for confusion, decreased concentration, hallucinations and self-injury. The patient is nervous/anxious.     Medications: I have reviewed the patient's current medications.  Current Outpatient Medications  Medication Sig Dispense Refill  . amitriptyline (ELAVIL) 50 MG tablet Take 1 tablet (50 mg total) by mouth at bedtime. 90 tablet 0  . atorvastatin (LIPITOR) 10 MG tablet TAKE 1 TABLET ONCE DAILY. 30 tablet 0  . celecoxib (CELEBREX) 200 MG capsule TAKE 1 CAPSULE TWICE DAILY FOR CHRONIC PAIN. 180 capsule 0  . Cyanocobalamin (B-12 COMPLIANCE INJECTION) 1000 MCG/ML KIT One Injection weekly for 4 weeks then one injection monthly for 3 month 7 kit 0  . doxycycline (VIBRAMYCIN) 100 MG capsule Take 100 mg by mouth daily.    Marland Kitchen gabapentin (NEURONTIN) 300 MG capsule Take 2 capsules in the morning, 1 in the middle of the day, and 2 at night. (Patient taking differently: 4 capsules daily.) 150 capsule 3  . glipiZIDE (GLUCOTROL XL) 10 MG 24 hr tablet TAKE 1 TABLET BY MOUTH TWICE DAILY WITH A MEAL. 180 tablet 0  . insulin aspart (NOVOLOG) 100 UNIT/ML injection Inject into  the skin 3 (three) times daily before meals.    Marland Kitchen JARDIANCE 25 MG TABS tablet Take 25 mg by mouth daily.    Marland Kitchen liraglutide (VICTOZA) 18 MG/3ML SOPN Inject into the skin.    Marland Kitchen lithium carbonate (LITHOBID) 300 MG CR tablet Take 3 tablets (900 mg  total) by mouth at bedtime. 90 tablet 3  . oxyCODONE-acetaminophen (PERCOCET/ROXICET) 5-325 MG tablet Take 1 tablet by mouth every 4 (four) hours as needed for severe pain. 15 tablet 0  . pantoprazole (PROTONIX) 40 MG tablet TAKE 1 TABLET ONCE DAILY. 30 tablet 0  . propranolol (INDERAL) 40 MG tablet take ONE TO TWO tablets every EIGHT hours  FOR anxiety 150 tablet 0  . tretinoin (RETIN-A) 0.025 % cream Apply 1 application topically at bedtime.    Marland Kitchen VIIBRYD 40 MG TABS Take 1.5 tablets (60 mg total) by mouth daily. 45 tablet 0  . XARELTO 20 MG TABS tablet TAKE 1 TABLET IN THE EVENING WITH MEAL. 30 tablet 0  . albuterol (VENTOLIN HFA) 108 (90 Base) MCG/ACT inhaler Inhale 2 puffs into the lungs every 6 (six) hours as needed for wheezing or shortness of breath. (Patient not taking: Reported on 06/21/2020) 8 g 0  . hydrOXYzine (ATARAX/VISTARIL) 25 MG tablet Take 1 tablet (25 mg total) by mouth 3 (three) times daily as needed. (Patient not taking: Reported on 06/21/2020) 30 tablet 0  . sulfamethoxazole-trimethoprim (BACTRIM DS) 800-160 MG tablet Take 1 tablet by mouth 2 (two) times daily. (Patient not taking: Reported on 06/21/2020) 14 tablet 0  . tiZANidine (ZANAFLEX) 4 MG tablet Take 1 tablet (4 mg total) by mouth every 6 (six) hours as needed for muscle spasms. (Patient not taking: Reported on 06/21/2020) 30 tablet 1   No current facility-administered medications for this visit.    Medication Side Effects: Sedation  Allergies:  Allergies  Allergen Reactions  . Tramadol Other (See Comments)    Tingling all over     Past Medical History:  Diagnosis Date  . Anxiety   . Asthma   . Depression   . Diabetes mellitus type 2 in obese ([place]) 06/11/2016  . Embolism - blood clot January 1997 & January 2017   Also had LLE DVT in 1997  . Hypertension   . Preeclampsia 10/10/2011   1999   . Sleep apnea     Family History  Problem Relation Age of Onset  . Dementia Father   . Heart disease Father   .  Clotting disorder Mother   . Arthritis Mother   . Clotting disorder Sister   . Clotting disorder Maternal Grandmother   . Clotting disorder Maternal Aunt   . Rheumatologic disease Neg Hx   . Hyperparathyroidism Neg Hx     Social History   Socioeconomic History  . Marital status: Married    Spouse name: Not on file  . Number of children: Y  . Years of education: Not on file  . Highest education level: Not on file  Occupational History  . Occupation: admin assist    Comment: Pharmacist, community  Tobacco Use  . Smoking status: Former Smoker    Packs/day: 1.00    Years: 20.00    Pack years: 20.00    Quit date: 03/12/2004    Years since quitting: 16.2  . Smokeless tobacco: Never Used  Substance and Sexual Activity  . Alcohol use: Yes    Alcohol/week: 1.0 standard drink    Types: 1 Cans of beer per week  Comment: 1-2 times a week  . Drug use: No  . Sexual activity: Yes    Partners: Male    Birth control/protection: None  Other Topics Concern  . Not on file  Social History Narrative   Originally from [place]. Always lived in [place]. 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 No international travel recently. Previously has been to [place] in May 1996. No mold exposure recently. No bird exposure. Does have multiple pets.    Social Determinants of Health   Financial Resource Strain: Not on file  Food Insecurity: Not on file  Transportation Needs: Not on file  Physical Activity: Not on file  Stress: Not on file  Social Connections: Not on file  Intimate Partner Violence: Not on file    Past Medical History, Surgical history, Social history, and Family history were reviewed and updated as appropriate.   Please see review of systems for further details on the patient's review from today.   Objective:   Physical Exam:  LMP  (LMP Unknown) Comment: last period ---  1 year ago (?)  Physical Exam Constitutional:      General: She is not in acute  distress. Musculoskeletal:        General: No deformity.  Neurological:     Mental Status: She is alert and oriented to person, place, and time.     Cranial Nerves: No dysarthria.     Coordination: Coordination normal.  Psychiatric:        Attention and Perception: Attention and perception normal. She does not perceive auditory or visual hallucinations.        Mood and Affect: Mood is anxious and depressed. Affect is not labile, blunt, angry, tearful or inappropriate.        Speech: Speech normal.        Behavior: Behavior normal. Behavior is cooperative.        Thought Content: Thought content normal. Thought content is not paranoid or delusional. Thought content does not include homicidal or suicidal ideation. Thought content does not include homicidal or suicidal plan.        Cognition and Memory: Cognition and memory normal.        Judgment: Judgment normal.     Comments: Insight intact Depression no better     Lab Review:     Component Value Date/Time   NA 141 04/06/2020 0939   NA 138 08/21/2019 0000   NA 140 12/11/2016 1236   K 4.7 04/06/2020 0939   K 4.2 12/11/2016 1236   CL 106 04/06/2020 0939   CO2 28 04/06/2020 0939   CO2 24 12/11/2016 1236   GLUCOSE 159 (H) 04/06/2020 0939   GLUCOSE 109 12/11/2016 1236   BUN 16 04/06/2020 0939   BUN 12 08/21/2019 0000   BUN 15.1 12/11/2016 1236   CREATININE 0.70 04/06/2020 0939   CREATININE 0.68 02/22/2020 1649   CREATININE 0.8 12/11/2016 1236   CALCIUM 10.1 04/06/2020 0939   CALCIUM 10.0 12/11/2016 1236   PROT 6.8 04/06/2020 0939   PROT 7.5 12/11/2016 1236   ALBUMIN 4.4 04/06/2020 0939   ALBUMIN 4.4 12/11/2016 1236   AST 18 04/06/2020 0939   AST 38 (H) 12/11/2016 1236   ALT 32 04/06/2020 0939   ALT 61 (H) 12/11/2016 1236   ALKPHOS 53 04/06/2020 0939   ALKPHOS 71 12/11/2016 1236   BILITOT 0.7 04/06/2020 0939   BILITOT 0.62 12/11/2016 1236   GFRNONAA 105 12/11/2018 1121   GFRAA 122 12/11/2018 1121  Component  Value Date/Time   WBC 8.4 04/06/2020 0939   RBC 4.85 04/06/2020 0939   HGB 14.5 04/06/2020 0939   HGB 14.5 12/11/2016 1236   HCT 44.3 04/06/2020 0939   HCT 43.9 12/11/2016 1236   PLT 229.0 04/06/2020 0939   PLT 289 12/11/2016 1236   PLT 295 07/24/2016 1848   MCV 91.3 04/06/2020 0939   MCV 93.2 12/11/2016 1236   MCH 31.4 02/22/2020 1649   MCHC 32.6 04/06/2020 0939   RDW 14.4 04/06/2020 0939   RDW 13.5 12/11/2016 1236   LYMPHSABS 2,330 06/05/2017 1040   LYMPHSABS 2.4 12/11/2016 1236   MONOABS 0.7 12/11/2016 1236   EOSABS 692 (H) 06/05/2017 1040   EOSABS 0.7 (H) 12/11/2016 1236   BASOSABS 73 06/05/2017 1040   BASOSABS 0.0 12/11/2016 1236    Lithium Lvl  Date Value Ref Range Status  12/11/2018 0.8 0.6 - 1.2 mmol/L Final    Serum nortriptyline level on 150 mg a day was 109  Amitriptyline  Level 12/11/2018 =237  On 250 mg daily,.    No results found for: PHENYTOIN, PHENOBARB, VALPROATE, CBMZ   .res Assessment: Plan:    Severe recurrent major depression without psychotic features ([place])  Generalized anxiety disorder  Hypersomnolence  Obstructive sleep apnea  Low vitamin D level  Diffuse pain  Lithium-induced tremor   Elyana has chronic severe major depression and generalized anxiety that is treatment resistant and failed multiple medications ECT and TMS as noted above including SSRIs, try cyclic's, lithium, and atypical antipsychotics for depression.. She has had partial benefit from amitriptyline plus lithium.  However she is having tremor problems from the lithium.  Efforts to reduce the lithium have led to worsening psychiatric symptoms.  There has been a discussion about possible bipolar elements but she has no clear manic episodes unrelated to medication changes.  She has features of atypical depression.  So an MAO inhibitor is attractive from that perspective.  Discussed Spravato option in detail.  We will discuss it again at follow-up appointment.  She was  given information to read in the interim and the treatment plan was discussed in terms of frequency and side effects.  She is more open to this now.  Extensive discussion about procedure time commitment. Rec go for consult.  Discussed the process in detail of consultation with Dr. Toy Care. To go for consultation.  Because she feels more desperate with some suicidal thoughts without intent or plan we will initiate a trial of olanzapine 10 mg nightly for the treatment resistant depression.  We discussed that this may not be an ideal medication over the long-term and that she would need to pay attention to her blood sugar and be cautioned about potential for weight gain. Consider hospitalization if sx worsen.  Therefore consideration could be given to other trials of MAO inhibitors as they are classically more effective than other antidepressants and atypical depression.  She did fail a trial of Emsam.  Consideration could be given to using tablet version of selegiline and going to much higher doses or the use of Parnate or Nardil.. Other option for TRD not tried is Symbyax but weight gain risk.  Option of adding olanzapine to current antidepressant but she reports she does not check her blood sugar very often and that would not be a good scenario with olanzapine.  Statistically speaking the most potentially beneficial option would be Parnate or Nardil but they are difficult to use   Need to take Viibryd 60 with food  emphasized.  She's improving but not consistent.  check lithium level  Rec counseling.  Last did April 2019 after a hospitalization. She's back seeing Dorrene German..  Encourage physical activity and weight loss.  She is not particularly motivated and feels that is hard to accomplish because of chronic pain.  Chronic pain complicates her treatment of depression.  Recent B12 deficiency dx and treatment just started.  Hold B6 for lithium tremor. 500 mg BID. If needed.  Tremor is better.   None currently.  Try to take lithium also 4-5 hours before sleep to minimize daytime tremor. depression was worse after reducing the lithium.  Irritability was also worse after reducing the lithium to 600 mg daily.   Pain worse off amitriptyline.  Better on  amitriptyline 50 mg No longer sedating. Is sleeping better.  No sig risk serotonin syndrome with this low of a dose.  History of low vitamin D recently recurred and started OTC vitamin D.  Follow-up 8 weeks  [name], [name]  No future appointments.  No orders of the defined types were placed in this encounter.     -------------------------------

## 2020-06-23 ENCOUNTER — Encounter: Payer: Self-pay | Admitting: Family Medicine

## 2020-06-23 ENCOUNTER — Other Ambulatory Visit: Payer: Self-pay

## 2020-06-23 ENCOUNTER — Telehealth (INDEPENDENT_AMBULATORY_CARE_PROVIDER_SITE_OTHER): Payer: BC Managed Care – PPO | Admitting: Family Medicine

## 2020-06-23 ENCOUNTER — Other Ambulatory Visit: Payer: BC Managed Care – PPO

## 2020-06-23 VITALS — Ht 68.0 in | Wt 230.0 lb

## 2020-06-23 DIAGNOSIS — E538 Deficiency of other specified B group vitamins: Secondary | ICD-10-CM

## 2020-06-23 DIAGNOSIS — E559 Vitamin D deficiency, unspecified: Secondary | ICD-10-CM | POA: Diagnosis not present

## 2020-06-23 DIAGNOSIS — E1169 Type 2 diabetes mellitus with other specified complication: Secondary | ICD-10-CM | POA: Diagnosis not present

## 2020-06-23 DIAGNOSIS — G629 Polyneuropathy, unspecified: Secondary | ICD-10-CM

## 2020-06-23 DIAGNOSIS — E669 Obesity, unspecified: Secondary | ICD-10-CM

## 2020-06-23 MED ORDER — OZEMPIC (1 MG/DOSE) 4 MG/3ML ~~LOC~~ SOPN: 2.0000 mg | PEN_INJECTOR | SUBCUTANEOUS | 5 refills | Status: DC

## 2020-06-23 NOTE — Assessment & Plan Note (Signed)
Check B12 

## 2020-06-23 NOTE — Progress Notes (Signed)
   Deborah Soto is a 55 y.o. female who presents today for a telephone visit.  Assessment/Plan:  Chronic Problems Addressed Today: Vitamin D deficiency Check Vit D.  Neuropathy Still not improved. Will recheck B12. Continue gabapentin.   Vitamin B12 deficiency Check B12.   Diabetes mellitus type 2 in obese [facility]) Had lengthy discussion with patient regarding her diabetes treatment.  She is not sure of her last A1c.  She has been following with endocrinology for the last few months.  She has been using freestyle libre monitoring system.  Apparently she is on Victoza 1.8 mg daily, Trulicity 1.5 mg weekly, tresiba 30 units daily, glipizide 10 units twice daily, and Jardiance 25 mg daily.  We will switch her GLP-1 agonist to Ozempic 2 mg weekly.  We will also stop glipizide as I do not think this giving much benefit at this point.  Continue current dose of [place].  Check A1c today.  We will recheck in 3 months.     Subjective:  HPI:  See A/P       Objective/Observations   No acute distress  Telephone Visit   I connected with Deborah Soto on 06/23/20 at  4:00 PM EDT via telephone and verified that I am speaking with the correct person using two identifiers. I discussed the limitations of evaluation and management by telemedicine and the availability of in person appointments. The patient expressed understanding and agreed to proceed.   Patient location: Home Provider location: [place] [facility] [employer] [facility] Persons participating in the virtual visit: Myself and Patient.       Katina Degree. [name], MD 06/23/2020 2:36 PM

## 2020-06-23 NOTE — Addendum Note (Signed)
Addended by: Vincenza Hews on: 06/23/2020 02:54 PM   Modules accepted: Orders

## 2020-06-23 NOTE — Assessment & Plan Note (Signed)
Check Vit D 

## 2020-06-23 NOTE — Assessment & Plan Note (Signed)
Had lengthy discussion with patient regarding her diabetes treatment.  She is not sure of her last A1c.  She has been following with endocrinology for the last few months.  She has been using freestyle libre monitoring system.  Apparently she is on Victoza 1.8 mg daily, Trulicity 1.5 mg weekly, tresiba 30 units daily, glipizide 10 units twice daily, and Jardiance 25 mg daily.  We will switch her GLP-1 agonist to Ozempic 2 mg weekly.  We will also stop glipizide as I do not think this giving much benefit at this point.  Continue current dose of [place].  Check A1c today.  We will recheck in 3 months.

## 2020-06-23 NOTE — Assessment & Plan Note (Signed)
Still not improved. Will recheck B12. Continue gabapentin.

## 2020-06-24 DIAGNOSIS — F332 Major depressive disorder, recurrent severe without psychotic features: Secondary | ICD-10-CM | POA: Diagnosis not present

## 2020-06-24 LAB — HEMOGLOBIN A1C
Hgb A1c MFr Bld: 7.7 % of total Hgb — ABNORMAL HIGH (ref <5.7)
Mean Plasma Glucose: 174 mg/dL
eAG (mmol/L): 9.7 mmol/L

## 2020-06-24 LAB — VITAMIN B12: Vitamin B-12: 671 pg/mL (ref 200–1100)

## 2020-06-24 LAB — VITAMIN D 25 HYDROXY (VIT D DEFICIENCY, FRACTURES): Vit D, 25-Hydroxy: 43 ng/mL (ref 30–100)

## 2020-06-26 ENCOUNTER — Encounter: Payer: Self-pay | Admitting: Family Medicine

## 2020-06-27 ENCOUNTER — Telehealth: Payer: Self-pay | Admitting: Psychiatry

## 2020-06-27 DIAGNOSIS — M5412 Radiculopathy, cervical region: Secondary | ICD-10-CM | POA: Diagnosis not present

## 2020-06-27 NOTE — Telephone Encounter (Signed)
Asked Jola Babinski about the Spravato packet that needs to be filled out.  I think she is familiar with it.  The patient is noticing some side effect of feeling overly perky with the olanzapine.  That is not a typical side effect and may be a sign that she is starting to get some antidepressant effect out of the olanzapine.  If she can put up with the other side effects a little longer then my encouragement is to continue the current dosage of 10 mg longer.  I am afraid if she cuts the dosage back she may lose any potential antidepressant effect because the dose is not high at 10 mg.  However if she finds the other side effects intolerable she can cut it back to 5 mg if absolutely necessary.

## 2020-06-27 NOTE — Telephone Encounter (Signed)
Sorry I misread the message the first message,I will talk to [place] in the morning.Deborah Soto said she will stay on the same dose of olanzapine and give it some time.

## 2020-06-27 NOTE — Telephone Encounter (Signed)
Did you want me to call her with this information?Or did you speak to her already?

## 2020-06-27 NOTE — Progress Notes (Signed)
Please inform patient of the following:  Her B12 levels are stable.  Her Vitamin D level is normal. Her A1c is 7.7.  We do not need to make any other changes to her treatment plan at this time. I would like to see her here in 3 months and we can recheck a1c at that time.  Katina Degree. [name], MD 06/27/2020 3:47 PM

## 2020-06-27 NOTE — Telephone Encounter (Signed)
Deborah Soto called in today in regards to being seen at Dr Carie Caddy office for [facility] treatments. She states that the office needs a referral packet filled out before she can be seen.  [name] also states that she has been taking Olanzapine for about a week and has been experiencing side effects. She states she has just been feeling weird and overly perky. She also has been having difficulty recalling and saying certain words. She asked can she cut back as well as if the side effects are temporary. Pls RTC [phone]

## 2020-06-27 NOTE — Telephone Encounter (Signed)
A previous pt brought a referral from Dr Evelene Croon for [name] to fill out for spravato,so will I need to let the pt know to have him send [name] a referral form also? Also pt requesting to cut back on olanzapine.

## 2020-06-27 NOTE — Telephone Encounter (Signed)
Please talk to [place] in the morning about the Spravato referral packet to Dr. Celene Skeen office once is to fill out.  Also call the patient with the information I gave about the olanzapine.  I have not spoken with the patient

## 2020-06-28 DIAGNOSIS — F332 Major depressive disorder, recurrent severe without psychotic features: Secondary | ICD-10-CM | POA: Diagnosis not present

## 2020-06-28 DIAGNOSIS — M25522 Pain in left elbow: Secondary | ICD-10-CM | POA: Diagnosis not present

## 2020-06-30 ENCOUNTER — Encounter: Payer: Self-pay | Admitting: Family Medicine

## 2020-06-30 NOTE — Telephone Encounter (Signed)
Referral for Spravato was faxed to [facility].

## 2020-07-01 ENCOUNTER — Telehealth: Payer: Self-pay

## 2020-07-01 NOTE — Telephone Encounter (Signed)
Patient is retuning a call from [place], she states she has no medication with her other than Jardiance, requesting a call back.

## 2020-07-01 NOTE — Telephone Encounter (Signed)
Called and left belowmessage on pt vm since pt did not answer, mychart message sent also.

## 2020-07-01 NOTE — Telephone Encounter (Signed)
Called and spoke with pt and gave below message. 

## 2020-07-01 NOTE — Telephone Encounter (Signed)
She should temporarily increase her basal insulin until she is able to restart on the ozempic.   I recommend going from 30 to 33 units of tresiba daily.  She can increase her insulin by 2-3 units daily if her fasting blood sugar is above 150.  Deborah Soto. [name], MD 07/01/2020 10:13 AM

## 2020-07-01 NOTE — Telephone Encounter (Signed)
.  patient is on vacation and accidentally left Semaglutide, 1 MG/DOSE, (OZEMPIC, 1 MG/DOSE,) 4 MG/3ML SOPN At home and states her sugars have been high and she is trying to figure out what to do because she states insurance will not cover anther dose of the ozempic right now  Current reading  241

## 2020-07-05 ENCOUNTER — Encounter: Payer: Self-pay | Admitting: Family Medicine

## 2020-07-05 ENCOUNTER — Ambulatory Visit: Payer: BC Managed Care – PPO | Admitting: Family Medicine

## 2020-07-05 ENCOUNTER — Other Ambulatory Visit: Payer: Self-pay

## 2020-07-05 VITALS — BP 126/86 | HR 91 | Ht 68.0 in | Wt 248.8 lb

## 2020-07-05 DIAGNOSIS — G4733 Obstructive sleep apnea (adult) (pediatric): Secondary | ICD-10-CM | POA: Diagnosis not present

## 2020-07-05 DIAGNOSIS — R202 Paresthesia of skin: Secondary | ICD-10-CM

## 2020-07-05 DIAGNOSIS — M5412 Radiculopathy, cervical region: Secondary | ICD-10-CM

## 2020-07-05 DIAGNOSIS — F332 Major depressive disorder, recurrent severe without psychotic features: Secondary | ICD-10-CM | POA: Diagnosis not present

## 2020-07-05 DIAGNOSIS — I10 Essential (primary) hypertension: Secondary | ICD-10-CM | POA: Diagnosis not present

## 2020-07-05 NOTE — Patient Instructions (Addendum)
Thank you for coming in today.  Plan for neck surgery with Dr Yevette Edwards. I think he is great.   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   Keep me updated.   I can do an injection that may help.   I can also ask Dr Ethelene Hal to do a nerve conduction study on your leg.

## 2020-07-05 NOTE — Progress Notes (Signed)
I, Christoper Fabian, LAT, ATC, am serving as scribe for Dr. [name].  Deborah Soto is a 55 y.o. female who presents to [facility] at [facility] today for f/u of recent superficial numbness noted in her R thigh and R arm.  She was last seen by Dr. Denyse Amass on 05/27/20 for neck and R shoulder/arm pain f/u after having a R C7-T1 interlaminar epidural on 05/17/20.  She was referred for a 2nd epidural and prescribed a limited quantity of oxycodone-acetaminophen.  She was also referred to orthopedics for a 2nd opinion regarding her c-spine.  She has had a L elbow ulnar nerve decompression w/ Dr. Melvyn Novas on 06/13/20.  Since her last visit w/ Dr. Denyse Amass, pt reports that her R shoulder and neck pain is improved but is noticing superficial numbness in her R shoulder, arm and R thigh.  She has also started to have similar pain in her L shoulder like she had in her R shoulder.  She was seen by Dr. Yevette Edwards and is planning on having C-spine surgery soon.   Pertinent review of systems: No fevers or chills  Relevant historical information: Recently had an ulnar nerve decompression   Exam:  BP 126/86 (BP Location: Right Arm, Patient Position: Sitting, Cuff Size: Large)   Pulse 91   Ht 5\' 8"  (1.727 m)   Wt 248 lb 12.8 oz (112.9 kg)   LMP  (LMP Unknown) Comment: last period ---  1 year ago (?)  SpO2 96%   BMI 37.83 kg/m  General: Well Developed, well nourished, and in no acute distress.   MSK: Right arm intact strength.  Decreased sensation to light touch deltoid and upper arm  Right thigh normal-appearing decree sensation light touch anterior lateral thigh. Intact strength lower extremity. Reflexes are intact. Unable to reproduce symptoms with pressure over ASIS.    Lab and Radiology Results  EXAM: MRI CERVICAL SPINE WITHOUT CONTRAST  TECHNIQUE: Multiplanar, multisequence MR imaging of the cervical spine was performed. No intravenous contrast was administered.  COMPARISON:   Cervical spine MRI 05/10/2015. Cervical spine radiographs 03/14/2020  FINDINGS: Alignment: Mild retrolisthesis C3-4. 3 mm retrolisthesis C5-6. Straightening of the cervical lordosis  Vertebrae: Negative for fracture or mass.  Cord: Normal signal and morphology  Posterior Fossa, vertebral arteries, paraspinal tissues: Negative  Disc levels:  C2-3: Negative  C3-4: Moderate to severe right foraminal encroachment due to uncinate spurring. Left foramen patent. Spinal canal patent  C4-5: Moderate right foraminal encroachment due to facet hypertrophy and uncinate spurring. Left foramen patent.  C5-6: Disc degeneration with diffuse uncinate spurring. Possible right foraminal disc protrusion. Bilateral facet hypertrophy. Moderate foraminal encroachment bilaterally right greater than left. Mild spinal stenosis  C6-7: Disc degeneration with diffuse uncinate spurring. Mild foraminal narrowing bilaterally  C7-T1: Negative  IMPRESSION: Multilevel degenerative change throughout the cervical spine as described above  Significant right foraminal encroachment C3-4, C4-5, and C5-6 due to spurring.   Electronically Signed   By: 05/12/2020 M.D.   On: 05/05/2020 09:07  EXAM: MRI LUMBAR SPINE WITHOUT CONTRAST  TECHNIQUE: Multiplanar, multisequence MR imaging of the lumbar spine was performed. No intravenous contrast was administered.  COMPARISON:  Lumbar spine radiographs 03/14/2020  FINDINGS: Segmentation:  Normal  Alignment:  Normal  Vertebrae:  Normal bone marrow.  Negative for fracture or mass.  Conus medullaris and cauda equina: Conus extends to the L1 level. Conus and cauda equina appear normal.  Paraspinal and other soft tissues: Negative for paraspinous mass or adenopathy.  Disc levels:  L1-2: Negative  L2-3: Early disc degeneration.  Negative for stenosis  L3-4: Early disc degeneration and mild facet degeneration. Negative for  stenosis  L4-5: Mild facet degeneration. Negative for disc protrusion or stenosis  L5-S1: Early disc and facet degeneration. Small central disc protrusion. Negative for neural impingement.  IMPRESSION: Mild lumbar degenerative change. Negative for neural impingement or spinal stenosis.   Electronically Signed   By: Marlan Palau M.D.   On: 05/05/2020 08:46  I, [name], personally (independently) visualized and performed the interpretation of the images attached in this note.     Assessment and Plan: 55 y.o. female with right arm numbness very consistent with cervical radiculopathy along the dermatomal patterns seen on MRI in February.  Consistent with right C4-C5 and C6.  She had benefit from epidural steroid injections but not full significant benefit.  Agree with Dr. Yevette Edwards and will agree with proceeding with surgery.  Right thigh numbness less clear.  She has no weakness and does not have significant nerve compression seen on lumbar MRI from February at L2 or L3.  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  Her symptoms are very mild.  Plan for watchful waiting and if needed trial of injection at lateral femoral cutaneous nerve or even nerve conduction study.    Discussed warning signs or symptoms. Please see discharge instructions. Patient expresses understanding.   The above documentation has been reviewed and is accurate and complete [name], M.D.  Total encounter time 20 minutes including face-to-face time with the patient and, reviewing past medical record, and charting on the date of service.   Treatment plan and options

## 2020-07-06 ENCOUNTER — Other Ambulatory Visit: Payer: Self-pay | Admitting: Family Medicine

## 2020-07-06 NOTE — Telephone Encounter (Signed)
Rx refill request should be sent to pt's PCP 

## 2020-07-07 ENCOUNTER — Encounter: Payer: Self-pay | Admitting: Family Medicine

## 2020-07-07 ENCOUNTER — Other Ambulatory Visit: Payer: Self-pay

## 2020-07-07 ENCOUNTER — Ambulatory Visit: Payer: BC Managed Care – PPO | Admitting: Family Medicine

## 2020-07-07 ENCOUNTER — Other Ambulatory Visit: Payer: Self-pay | Admitting: Orthopedic Surgery

## 2020-07-07 ENCOUNTER — Telehealth: Payer: Self-pay | Admitting: Family Medicine

## 2020-07-07 VITALS — BP 140/92 | HR 94 | Temp 98.2°F | Ht 68.0 in | Wt 251.2 lb

## 2020-07-07 DIAGNOSIS — E1169 Type 2 diabetes mellitus with other specified complication: Secondary | ICD-10-CM | POA: Diagnosis not present

## 2020-07-07 DIAGNOSIS — G629 Polyneuropathy, unspecified: Secondary | ICD-10-CM

## 2020-07-07 DIAGNOSIS — E669 Obesity, unspecified: Secondary | ICD-10-CM

## 2020-07-07 DIAGNOSIS — R251 Tremor, unspecified: Secondary | ICD-10-CM | POA: Diagnosis not present

## 2020-07-07 DIAGNOSIS — G1220 Motor neuron disease, unspecified: Secondary | ICD-10-CM

## 2020-07-07 DIAGNOSIS — E119 Type 2 diabetes mellitus without complications: Secondary | ICD-10-CM

## 2020-07-07 DIAGNOSIS — F332 Major depressive disorder, recurrent severe without psychotic features: Secondary | ICD-10-CM

## 2020-07-07 DIAGNOSIS — E559 Vitamin D deficiency, unspecified: Secondary | ICD-10-CM | POA: Diagnosis not present

## 2020-07-07 DIAGNOSIS — E538 Deficiency of other specified B group vitamins: Secondary | ICD-10-CM | POA: Diagnosis not present

## 2020-07-07 DIAGNOSIS — R202 Paresthesia of skin: Secondary | ICD-10-CM

## 2020-07-07 MED ORDER — RIVAROXABAN 20 MG PO TABS: 20.0000 mg | ORAL_TABLET | Freq: Every day | ORAL | 5 refills | Status: DC

## 2020-07-07 NOTE — Patient Instructions (Signed)
It was very nice to see you today!  Please try stopping the doxycycline to see if this helps with your rash.  Send a message next week.  We will get you set up for an MRI of your brain and thoracic spine.  I will also touch base with Dr. Denyse Amass about getting set up for nerve conduction study  We will see back in a few months to recheck your A1c.  Take care, Dr [name]  PLEASE NOTE:  If you had any lab tests please let us know if you have not heard back within a few days. You may see your results on mychart before we have a chance to review them but we will give you a call once they are reviewed by [name]. If we ordered any referrals today, please let us know if you have not heard from their office within the next week.   Please try these tips to maintain a healthy lifestyle:   Eat at least 3 REAL meals and 1-2 snacks per day.  Aim for no more than 5 hours between eating.  If you eat breakfast, please do so within one hour of getting up.    Each meal should contain half fruits/vegetables, one quarter protein, and one quarter carbs (no bigger than a computer mouse)   Cut down on sweet beverages. This includes juice, soda, and sweet tea.     Drink at least 1 glass of water with each meal and aim for at least 8 glasses per day   Exercise at least 150 minutes every week.

## 2020-07-07 NOTE — Progress Notes (Signed)
   Deborah Soto is a 55 y.o. female who presents today for an office visit.  Assessment/Plan:  New/Acute Problems: Rash No red flags.  Possibly due to doxycycline photosensitivity.  Advised her to stop and let us know if not improving over the next week or so.  Chronic Problems Addressed Today: Vitamin D deficiency Last vitamin D level at goal.  She is not sure what dose of supplements she is taking.  She will check when she gets home.  Ideally would like to try to get her vitamin D level closer to 60 or above.  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  Recent lumbar MRI does not show any significant abnormalities that would explain her lower extremity neuropathy.  At this point would be reasonable to image both brain and thoracic spine to fully assess her CNS.  She has been discussing possible nerve conduction study on lower extremities.  We will consider this depending on results of her MRI.  Vitamin B12 deficiency Last B12 at goal.  Continue B12 supplementation.  Diabetes mellitus type 2 in obese [facility]) We switched her medications a couple weeks ago.  She is now on Ozempic 2 mg weekly, tresiba 30 units daily, and Jardiance 25 mg daily.  We will recheck A1c in a few months.  MDD (major depressive disorder), recurrent severe, without psychosis (HCC) Following with psychiatry.  Symptoms are currently stable.  She will be starting intranasal ketamine therapy soon.     Subjective:  HPI:  Patient here for follow-up.  See A/P for status of chronic conditions.  She has had ongoing issues with her upper extremity and lower extremity pain.  She does have cervical goal radiculopathy and is following with sports medicine and orthopedics for this.  She will be undergoing surgery soon to alleviate her cervical radiculopathy symptoms.  She is also had ongoing weakness with bilateral leg numbness and occasional weakness.  She occasionally falls due to her  legs giving out on her.  We had a lumbar spine down about 2 months ago which did not show any evidence of nerve impingement in her lumbar spine.  She recently went to [facility] for a volleyball tournament that her daughter was playing in.  While there she developed swelling and redness to bilateral lower extremities.  Swelling has improved however redness persist.  No other obvious precipitating events.  No specific treatments tried.       Objective:  Physical Exam: BP (!) 140/92   Pulse 94   Temp 98.2 F (36.8 C)   Ht 5\' 8"  (1.727 m)   Wt 251 lb 3.2 oz (113.9 kg)   LMP  (LMP Unknown) Comment: last period ---  1 year ago (?)  SpO2 98%   BMI 38.19 kg/m   Gen: No acute distress, resting comfortably CV: Regular rate and rhythm with no murmurs appreciated Pulm: Normal work of breathing, clear to auscultation bilaterally with no crackles, wheezes, or rhonchi Skin: Diffuse faintly erythematous rash on lower extremities. Neuro: Grossly normal, moves all extremities Psych: Normal affect and thought content  Time Spent: 50 minutes of total time was spent on the date of the encounter performing the following actions: chart review prior to seeing the patient including recent visits with specialists,  obtaining history, performing a medically necessary exam, counseling on the treatment plan, placing orders, and documenting in our EHR.        . Katina Degree, MD 07/07/2020 12:47 PM

## 2020-07-07 NOTE — Assessment & Plan Note (Signed)
Following with psychiatry.  Symptoms are currently stable.  She will be starting intranasal ketamine therapy soon.

## 2020-07-07 NOTE — Assessment & Plan Note (Signed)
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  Recent lumbar MRI does not show any significant abnormalities that would explain her lower extremity neuropathy.  At this point would be reasonable to image both brain and thoracic spine to fully assess her CNS.  She has been discussing possible nerve conduction study on lower extremities.  We will consider this depending on results of her MRI.

## 2020-07-07 NOTE — Assessment & Plan Note (Signed)
Last vitamin D level at goal.  She is not sure what dose of supplements she is taking.  She will check when she gets home.  Ideally would like to try to get her vitamin D level closer to 60 or above.

## 2020-07-07 NOTE — Assessment & Plan Note (Signed)
We switched her medications a couple weeks ago.  She is now on Ozempic 2 mg weekly, tresiba 30 units daily, and Jardiance 25 mg daily.  We will recheck A1c in a few months.

## 2020-07-07 NOTE — Telephone Encounter (Signed)
Pt just left Dr. Lavone Neri office. Per patient Dr. [name] will be contacting Dr. Denyse Amass to order a thoracic and brain MRI. Pt is having surgery June 8 and would like these done before the surgery if possible.

## 2020-07-07 NOTE — Assessment & Plan Note (Signed)
Last B12 at goal.  Continue B12 supplementation.

## 2020-07-08 NOTE — Telephone Encounter (Signed)
Called patient back.  MRIs already been ordered by Dr. [name].  It looks like maybe Dr. [name] wanted me to order an EMG.  Sent message to Dr. [name] to clarify.

## 2020-07-11 ENCOUNTER — Encounter: Payer: Self-pay | Admitting: Family Medicine

## 2020-07-11 DIAGNOSIS — Z4889 Encounter for other specified surgical aftercare: Secondary | ICD-10-CM | POA: Diagnosis not present

## 2020-07-11 DIAGNOSIS — M9901 Segmental and somatic dysfunction of cervical region: Secondary | ICD-10-CM | POA: Diagnosis not present

## 2020-07-11 DIAGNOSIS — M47812 Spondylosis without myelopathy or radiculopathy, cervical region: Secondary | ICD-10-CM | POA: Diagnosis not present

## 2020-07-11 DIAGNOSIS — M9902 Segmental and somatic dysfunction of thoracic region: Secondary | ICD-10-CM | POA: Diagnosis not present

## 2020-07-12 DIAGNOSIS — L719 Rosacea, unspecified: Secondary | ICD-10-CM | POA: Diagnosis not present

## 2020-07-12 DIAGNOSIS — L708 Other acne: Secondary | ICD-10-CM | POA: Diagnosis not present

## 2020-07-12 DIAGNOSIS — F332 Major depressive disorder, recurrent severe without psychotic features: Secondary | ICD-10-CM | POA: Diagnosis not present

## 2020-07-12 DIAGNOSIS — L738 Other specified follicular disorders: Secondary | ICD-10-CM | POA: Diagnosis not present

## 2020-07-12 DIAGNOSIS — D239 Other benign neoplasm of skin, unspecified: Secondary | ICD-10-CM | POA: Diagnosis not present

## 2020-07-12 NOTE — Telephone Encounter (Signed)
Please advise 

## 2020-07-13 DIAGNOSIS — M25522 Pain in left elbow: Secondary | ICD-10-CM | POA: Diagnosis not present

## 2020-07-14 DIAGNOSIS — F332 Major depressive disorder, recurrent severe without psychotic features: Secondary | ICD-10-CM | POA: Diagnosis not present

## 2020-07-16 ENCOUNTER — Other Ambulatory Visit: Payer: Self-pay | Admitting: Family Medicine

## 2020-07-16 DIAGNOSIS — E1165 Type 2 diabetes mellitus with hyperglycemia: Secondary | ICD-10-CM

## 2020-07-18 ENCOUNTER — Telehealth: Payer: Self-pay | Admitting: Psychiatry

## 2020-07-18 ENCOUNTER — Encounter: Payer: Self-pay | Admitting: Family Medicine

## 2020-07-18 DIAGNOSIS — F332 Major depressive disorder, recurrent severe without psychotic features: Secondary | ICD-10-CM | POA: Diagnosis not present

## 2020-07-18 NOTE — Telephone Encounter (Signed)
Spravato may be helpful even after 1 treatment or it can take up to a month.  When she starts feeling benefit from the Spravato she can cut the dosage of olanzapine in half.  Then after 1 week if she is not feeling any worse she can discontinue the olanzapine

## 2020-07-18 NOTE — Telephone Encounter (Signed)
Next visit is 08/17/20. Deborah Soto has started the Spravato injections. She wants to know if she should continue the Olanzapine or slowly taper it down? Her phone number is [phone].

## 2020-07-18 NOTE — Telephone Encounter (Signed)
See note

## 2020-07-18 NOTE — Telephone Encounter (Signed)
Please review

## 2020-07-18 NOTE — Telephone Encounter (Signed)
Pt informed

## 2020-07-19 DIAGNOSIS — F332 Major depressive disorder, recurrent severe without psychotic features: Secondary | ICD-10-CM | POA: Diagnosis not present

## 2020-07-19 NOTE — Telephone Encounter (Signed)
See note

## 2020-07-20 ENCOUNTER — Other Ambulatory Visit: Payer: Self-pay | Admitting: Family Medicine

## 2020-07-20 DIAGNOSIS — M9901 Segmental and somatic dysfunction of cervical region: Secondary | ICD-10-CM | POA: Diagnosis not present

## 2020-07-20 DIAGNOSIS — M9902 Segmental and somatic dysfunction of thoracic region: Secondary | ICD-10-CM | POA: Diagnosis not present

## 2020-07-20 DIAGNOSIS — M47812 Spondylosis without myelopathy or radiculopathy, cervical region: Secondary | ICD-10-CM | POA: Diagnosis not present

## 2020-07-20 DIAGNOSIS — F332 Major depressive disorder, recurrent severe without psychotic features: Secondary | ICD-10-CM | POA: Diagnosis not present

## 2020-07-20 DIAGNOSIS — Z4889 Encounter for other specified surgical aftercare: Secondary | ICD-10-CM | POA: Diagnosis not present

## 2020-07-25 ENCOUNTER — Telehealth: Payer: Self-pay

## 2020-07-25 MED ORDER — LORAZEPAM 0.5 MG PO TABS: ORAL_TABLET | ORAL | 0 refills | Status: DC

## 2020-07-25 NOTE — Telephone Encounter (Signed)
Called pt and left VM informing her Dr. Denyse Amass ordered a rx for her to take prior to her MRI.

## 2020-07-25 NOTE — Telephone Encounter (Signed)
Ativan prescribed.

## 2020-07-25 NOTE — Telephone Encounter (Signed)
Patient has two MRI's totaling 80 min Tomorrow and states she almost lost it at the last MRI and that was 40 minutes. Patient is wondering if she can get a valium or something to help her get through the MRI's. Pharmacy is [place].

## 2020-07-26 ENCOUNTER — Other Ambulatory Visit: Payer: Self-pay

## 2020-07-26 ENCOUNTER — Ambulatory Visit
Admission: RE | Admit: 2020-07-26 | Discharge: 2020-07-26 | Disposition: A | Discharge status: Home / Self Care | Payer: BC Managed Care – PPO | Source: Ambulatory Visit | Attending: Family Medicine | Admitting: Family Medicine

## 2020-07-26 ENCOUNTER — Other Ambulatory Visit: Payer: Self-pay | Admitting: Psychiatry

## 2020-07-26 ENCOUNTER — Encounter: Payer: Self-pay | Admitting: Family Medicine

## 2020-07-26 DIAGNOSIS — F332 Major depressive disorder, recurrent severe without psychotic features: Secondary | ICD-10-CM

## 2020-07-26 DIAGNOSIS — F411 Generalized anxiety disorder: Secondary | ICD-10-CM

## 2020-07-26 DIAGNOSIS — G1220 Motor neuron disease, unspecified: Secondary | ICD-10-CM | POA: Diagnosis not present

## 2020-07-26 DIAGNOSIS — M5124 Other intervertebral disc displacement, thoracic region: Secondary | ICD-10-CM | POA: Diagnosis not present

## 2020-07-26 DIAGNOSIS — M25522 Pain in left elbow: Secondary | ICD-10-CM | POA: Diagnosis not present

## 2020-07-26 DIAGNOSIS — M4804 Spinal stenosis, thoracic region: Secondary | ICD-10-CM | POA: Diagnosis not present

## 2020-07-26 DIAGNOSIS — R531 Weakness: Secondary | ICD-10-CM | POA: Diagnosis not present

## 2020-07-27 ENCOUNTER — Other Ambulatory Visit: Payer: Self-pay | Admitting: Psychiatry

## 2020-07-27 DIAGNOSIS — F411 Generalized anxiety disorder: Secondary | ICD-10-CM

## 2020-07-27 DIAGNOSIS — M9901 Segmental and somatic dysfunction of cervical region: Secondary | ICD-10-CM | POA: Diagnosis not present

## 2020-07-27 DIAGNOSIS — F332 Major depressive disorder, recurrent severe without psychotic features: Secondary | ICD-10-CM

## 2020-07-27 DIAGNOSIS — Z4889 Encounter for other specified surgical aftercare: Secondary | ICD-10-CM | POA: Diagnosis not present

## 2020-07-27 DIAGNOSIS — M47812 Spondylosis without myelopathy or radiculopathy, cervical region: Secondary | ICD-10-CM | POA: Diagnosis not present

## 2020-07-27 DIAGNOSIS — M9902 Segmental and somatic dysfunction of thoracic region: Secondary | ICD-10-CM | POA: Diagnosis not present

## 2020-07-28 ENCOUNTER — Other Ambulatory Visit: Payer: Self-pay

## 2020-07-28 ENCOUNTER — Ambulatory Visit: Payer: BC Managed Care – PPO | Admitting: Family Medicine

## 2020-07-28 ENCOUNTER — Encounter: Payer: Self-pay | Admitting: Family Medicine

## 2020-07-28 VITALS — BP 116/82 | HR 97 | Temp 98.0°F | Ht 68.0 in | Wt 250.0 lb

## 2020-07-28 DIAGNOSIS — M5010 Cervical disc disorder with radiculopathy, unspecified cervical region: Secondary | ICD-10-CM | POA: Diagnosis not present

## 2020-07-28 DIAGNOSIS — E669 Obesity, unspecified: Secondary | ICD-10-CM

## 2020-07-28 DIAGNOSIS — R21 Rash and other nonspecific skin eruption: Secondary | ICD-10-CM

## 2020-07-28 DIAGNOSIS — G629 Polyneuropathy, unspecified: Secondary | ICD-10-CM | POA: Diagnosis not present

## 2020-07-28 DIAGNOSIS — E1169 Type 2 diabetes mellitus with other specified complication: Secondary | ICD-10-CM | POA: Diagnosis not present

## 2020-07-28 NOTE — Assessment & Plan Note (Signed)
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  Symptoms were not prickly bothersome at this point.  Discussed starting steroid cream however deferred for now.

## 2020-07-28 NOTE — Patient Instructions (Signed)
It was very nice to see you today!  Please schedule appointment with Dr. Merlyn Albert to talk about your new MRI findings.  You have several bulging disks that are pinching on nerves in your back.  Please continue increasing your tresiba until your fasting sugars are 150 or less.  Please come back to see me in 1 to 2 months.  Come back to see me sooner if needed.  Take care, Dr [name]  PLEASE NOTE:  If you had any lab tests please let us know if you have not heard back within a few days. You may see your results on mychart before we have a chance to review them but we will give you a call once they are reviewed by [name]. If we ordered any referrals today, please let us know if you have not heard from their office within the next week.   Please try these tips to maintain a healthy lifestyle:   Eat at least 3 REAL meals and 1-2 snacks per day.  Aim for no more than 5 hours between eating.  If you eat breakfast, please do so within one hour of getting up.    Each meal should contain half fruits/vegetables, one quarter protein, and one quarter carbs (no bigger than a computer mouse)   Cut down on sweet beverages. This includes juice, soda, and sweet tea.     Drink at least 1 glass of water with each meal and aim for at least 8 glasses per day   Exercise at least 150 minutes every week.

## 2020-07-28 NOTE — Assessment & Plan Note (Signed)
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  This is likely contributing significantly to her lower extremity neuropathy.  Advised her to follow-up with her orthopedist and we will discuss neck steps.  Does not currently have any red flag signs or symptoms that would warrant emergent work-up.

## 2020-07-28 NOTE — Progress Notes (Signed)
   Deborah Soto is a 55 y.o. female who presents today for an office visit.  Assessment/Plan:  Chronic Problems Addressed Today: Cervical disc disorder with radiculopathy of cervical region She has been seeing orthopedics for this.  Will be having surgery in a couple of weeks.  She has on gabapentin for her symptoms currently.  We recently obtained MRI of her brain and thoracic spine.  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  Advised her to follow-up with her back specialist to discuss these findings.  Believe this is causing quite a bit of her distal neuropathy and instable gait.  Rash Had biopsy done for something similar a few years ago which showed nonspecific dermatitis potentially inflammatory or autoimmune.  Symptoms were not prickly bothersome at this point.  Discussed starting steroid cream however deferred for now.  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  This is likely contributing significantly to her lower extremity neuropathy.  Advised her to follow-up with her orthopedist and we will discuss neck steps.  Does not currently have any red flag signs or symptoms that would warrant emergent work-up.  Diabetes mellitus type 2 in obese (HCC) Fasting sugars are still above 200.  She is on [place] 60 units daily.  She just took her first dose of Ozempic 2 mg weekly about a week ago will be taking her second dose tomorrow.  We discussed switching her medications however we will continue with current regimen for now.  She will follow-up with me again in a few weeks via MyChart.  There was concern the metformin was causing dry mouth in the past.  If sugars continue to be not well controlled we will consider restarting low-dose metformin.  She is also on Jardiance 25 mg daily.  We will continue this.      Subjective:  HPI:  See A/p for status of chronic conditions.        Objective:  Physical Exam: BP 116/82   Pulse 97   Temp 98 F (36.7 C) (Temporal)   Ht 5\' 8"  (1.727 m)   Wt 250 lb (113.4 kg)   LMP  (LMP Unknown) Comment: last period ---  1 year ago (?)  SpO2 95%   BMI 38.01 kg/m   Gen: No acute distress, resting comfortably Neuro: Grossly normal, moves all extremities Psych: Normal affect and thought content   Time Spent: 45 minutes of total time was spent on the date of the encounter performing the following actions: chart review prior to seeing the patient including her recent imaging results, obtaining history, performing a medically necessary exam, counseling on the treatment plan, placing orders, and documenting in our EHR.        . Katina Degree, MD 07/28/2020 11:16 AM

## 2020-07-28 NOTE — Progress Notes (Signed)
I discussed this with patient at her office visit today.  Deborah Soto. [name], MD 07/28/2020 11:42 AM

## 2020-07-28 NOTE — Assessment & Plan Note (Signed)
Fasting sugars are still above 200.  She is on [place] 60 units daily.  She just took her first dose of Ozempic 2 mg weekly about a week ago will be taking her second dose tomorrow.  We discussed switching her medications however we will continue with current regimen for now.  She will follow-up with me again in a few weeks via MyChart.  There was concern the metformin was causing dry mouth in the past.  If sugars continue to be not well controlled we will consider restarting low-dose metformin.  She is also on Jardiance 25 mg daily.  We will continue this.

## 2020-07-28 NOTE — Assessment & Plan Note (Signed)
She has been seeing orthopedics for this.  Will be having surgery in a couple of weeks.  She has on gabapentin for her symptoms currently.  We recently obtained MRI of her brain and thoracic spine.  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  Advised her to follow-up with her back specialist to discuss these findings.  Believe this is causing quite a bit of her distal neuropathy and instable gait.

## 2020-07-29 DIAGNOSIS — F332 Major depressive disorder, recurrent severe without psychotic features: Secondary | ICD-10-CM | POA: Diagnosis not present

## 2020-08-01 DIAGNOSIS — M9902 Segmental and somatic dysfunction of thoracic region: Secondary | ICD-10-CM | POA: Diagnosis not present

## 2020-08-01 DIAGNOSIS — Z4889 Encounter for other specified surgical aftercare: Secondary | ICD-10-CM | POA: Diagnosis not present

## 2020-08-01 DIAGNOSIS — M9901 Segmental and somatic dysfunction of cervical region: Secondary | ICD-10-CM | POA: Diagnosis not present

## 2020-08-01 DIAGNOSIS — M47812 Spondylosis without myelopathy or radiculopathy, cervical region: Secondary | ICD-10-CM | POA: Diagnosis not present

## 2020-08-02 ENCOUNTER — Encounter: Payer: Self-pay | Admitting: Family Medicine

## 2020-08-02 DIAGNOSIS — F332 Major depressive disorder, recurrent severe without psychotic features: Secondary | ICD-10-CM | POA: Diagnosis not present

## 2020-08-04 DIAGNOSIS — F332 Major depressive disorder, recurrent severe without psychotic features: Secondary | ICD-10-CM | POA: Diagnosis not present

## 2020-08-05 DIAGNOSIS — I10 Essential (primary) hypertension: Secondary | ICD-10-CM | POA: Diagnosis not present

## 2020-08-05 DIAGNOSIS — G4733 Obstructive sleep apnea (adult) (pediatric): Secondary | ICD-10-CM | POA: Diagnosis not present

## 2020-08-05 DIAGNOSIS — F332 Major depressive disorder, recurrent severe without psychotic features: Secondary | ICD-10-CM | POA: Diagnosis not present

## 2020-08-08 ENCOUNTER — Encounter: Payer: Self-pay | Admitting: Family Medicine

## 2020-08-09 DIAGNOSIS — F332 Major depressive disorder, recurrent severe without psychotic features: Secondary | ICD-10-CM | POA: Diagnosis not present

## 2020-08-10 ENCOUNTER — Other Ambulatory Visit: Payer: Self-pay | Admitting: Psychiatry

## 2020-08-10 NOTE — Telephone Encounter (Signed)
Patient stated fasting glucose running 200-250  Rx order send to pharmacy

## 2020-08-11 DIAGNOSIS — F332 Major depressive disorder, recurrent severe without psychotic features: Secondary | ICD-10-CM | POA: Diagnosis not present

## 2020-08-12 ENCOUNTER — Telehealth: Payer: Self-pay

## 2020-08-12 MED ORDER — SYNJARDY XR 25-1000 MG PO TB24: 1.0000 | ORAL_TABLET | Freq: Every day | ORAL | 5 refills | Status: DC

## 2020-08-12 NOTE — Telephone Encounter (Signed)
Please see message and advise 

## 2020-08-12 NOTE — Telephone Encounter (Signed)
Form completed and faxed. 

## 2020-08-12 NOTE — Telephone Encounter (Signed)
Is faxing form over to hold xarelto.  Patient is having surgery on 6/8 and needs form faxed back as soon as possible. Please follow up once faxed.

## 2020-08-15 ENCOUNTER — Inpatient Hospital Stay (HOSPITAL_COMMUNITY)
Admission: RE | Admit: 2020-08-15 | Discharge: 2020-08-15 | Disposition: A | Discharge status: Home / Self Care | Payer: BC Managed Care – PPO | Source: Ambulatory Visit

## 2020-08-15 ENCOUNTER — Encounter (HOSPITAL_COMMUNITY): Payer: Self-pay

## 2020-08-15 ENCOUNTER — Other Ambulatory Visit: Payer: Self-pay | Admitting: *Deleted

## 2020-08-15 ENCOUNTER — Telehealth: Payer: Self-pay | Admitting: *Deleted

## 2020-08-15 DIAGNOSIS — F332 Major depressive disorder, recurrent severe without psychotic features: Secondary | ICD-10-CM | POA: Diagnosis not present

## 2020-08-15 DIAGNOSIS — M4722 Other spondylosis with radiculopathy, cervical region: Secondary | ICD-10-CM | POA: Diagnosis not present

## 2020-08-15 DIAGNOSIS — M545 Low back pain, unspecified: Secondary | ICD-10-CM | POA: Diagnosis not present

## 2020-08-15 DIAGNOSIS — M79604 Pain in right leg: Secondary | ICD-10-CM | POA: Diagnosis not present

## 2020-08-15 MED ORDER — TRESIBA FLEXTOUCH 100 UNIT/ML ~~LOC~~ SOPN: 80.0000 [IU] | PEN_INJECTOR | Freq: Every day | SUBCUTANEOUS | 2 refills | Status: DC

## 2020-08-15 NOTE — Telephone Encounter (Signed)
Rx send to pharmacy  

## 2020-08-15 NOTE — Telephone Encounter (Signed)
Lvm for Lupita Leash at [facility] to give the office a call back to give message below.

## 2020-08-15 NOTE — Progress Notes (Addendum)
Surgical Instructions    Your procedure is scheduled on Wednesday 08/17/2020.  Report to [facility] Main Entrance "A" at 10:00 A.M., then check in with the Admitting office.  Call this number if you have problems the morning of surgery:  [phone]   If you have any questions prior to your surgery date call [phone]: Open Monday-Friday 8am-4pm    Remember:  Do not eat after midnight the night before your surgery  You may drink clear liquids until 10:00am the morning of your surgery.   Clear liquids allowed are: Water, Non-Citrus Juices (without pulp), Carbonated Beverages, Clear Tea, Black Coffee Only, and Gatorade   Enhanced Recovery after Surgery for Orthopedics Enhanced Recovery after Surgery is a protocol used to improve the stress on your body and your recovery after surgery.  Patient Instructions  . The day of surgery (if you have diabetes):  o Drink ONE small 10 oz bottle of water by 10:00 am the morning of surgery o This bottle was given to you during your hospital  pre-op appointment visit.  o Nothing else to drink after completing the  Small 10 oz bottle of water.         If you have questions, please contact your surgeon's office.     Take these medicines the morning of surgery with A SIP OF WATER: Gabapentin (Neurontin) Propanolol (Inderal) Vilazodone HCl (Viibryd)  As of today, STOP taking any Celecoxib (Celebrex), Aspirin or aspiring-containing products (unless otherwise instructed by your surgeon), Aleve, Naproxen, Ibuprofen, Motrin, Advil, Goody's, BC's, all herbal medications, fish oil, and all vitamins.  Follow your physician's instructions on when to stop Rivaroxaban (Xarelto).  If no instructions were given by your physician then you will need to call the office to get those instructions.     WHAT DO I DO ABOUT MY DIABETES MEDICATION?   THE DAY BEFORE SURGERY and THE DAY OF SURGERY, hold your Empagliflozin-Metformin (Synjardy), [place], and  Gu[place].  . Do not take oral diabetes medicines (pills) the morning of surgery.  . THE DAY OF SURGERY, do not take other diabetes injectables, including Semaglutide (Ozempic).  HOW TO MANAGE YOUR DIABETES BEFORE AND AFTER SURGERY  Why is it important to control my blood sugar before and after surgery? . Improving blood sugar levels before and after surgery helps healing and can limit problems. . A way of improving blood sugar control is eating a healthy diet by: o  Eating less sugar and carbohydrates o  Increasing activity/exercise o  Talking with your doctor about reaching your blood sugar goals . High blood sugars (greater than 180 mg/dL) can raise your risk of infections and slow your recovery, so you will need to focus on controlling your diabetes during the weeks before surgery. . Make sure that the doctor who takes care of your diabetes knows about your planned surgery including the date and location.  How do I manage my blood sugar before surgery? . Check your blood sugar at least 4 times a day, starting 2 days before surgery, to make sure that the level is not too high or low.  . Check your blood sugar the morning of your surgery when you wake up and every 2 hours until you get to the Short Stay unit.  o If your blood sugar is less than 70 mg/dL, you will need to treat for low blood sugar: - Do not take insulin. - Treat a low blood sugar (less than 70 mg/dL) with  cup of clear juice (cranberry  or apple), 4 glucose tablets, OR glucose gel. - Recheck blood sugar in 15 minutes after treatment (to make sure it is greater than 70 mg/dL). If your blood sugar is not greater than 70 mg/dL on recheck, call [phone] for further instructions. . Report your blood sugar to the short stay nurse when you get to Short Stay.  . If you are admitted to the hospital after surgery: o Your blood sugar will be checked by the staff and you will probably be given insulin after surgery (instead of  oral diabetes medicines) to make sure you have good blood sugar levels. o The goal for blood sugar control after surgery is 80-180 mg/dL.           Do not wear jewelry or makeup Do not wear lotions, powders, perfumes, or deodorant. Do not shave 48 hours prior to surgery.   Do not bring valuables to the hospital. DO Not wear nail polish, gel polish, artificial nails, or any other type of covering on  natural nails including finger and toenails.  If patients have artificial nails, gel coating, etc. that need to be removed by a nail salon please have this removed prior to surgery or surgery may need to be canceled/delayed if the surgeon/ anesthesia feels like the patient is unable to be adequately monitored.             [facility] is not responsible for any belongings or valuables.  Do NOT Smoke (Tobacco/Vaping) or drink Alcohol 24 hours prior to your procedure  If you use a CPAP at night, you may bring all equipment for your overnight stay.   Contacts, glasses, hearing aids, dentures or partials may not be worn into surgery, please bring cases for these belongings   For patients admitted to the hospital, discharge time will be determined by your treatment team.   Patients discharged the day of surgery will not be allowed to drive home, and someone needs to stay with them for 24 hours.    Special instructions:    Oral Hygiene is also important to reduce your risk of infection.  Remember - BRUSH YOUR TEETH THE MORNING OF SURGERY WITH YOUR REGULAR TOOTHPASTE   [facility]- Preparing For Surgery  Before surgery, you can play an important role. Because skin is not sterile, your skin needs to be as free of germs as possible. You can reduce the number of germs on your skin by washing with CHG (chlorahexidine gluconate) Soap before surgery.  CHG is an antiseptic cleaner which kills germs and bonds with the skin to continue killing germs even after washing.     Please do not use if you have  an allergy to CHG or antibacterial soaps. If your skin becomes reddened/irritated stop using the CHG.  Do not shave (including legs and underarms) for at least 48 hours prior to first CHG shower. It is OK to shave your face.  Please follow these instructions carefully.    1.  Shower the NIGHT BEFORE SURGERY and the MORNING OF SURGERY with CHG Soap.    If you chose to wash your hair, wash your hair first as usual with your normal shampoo. After you shampoo, rinse your hair and body thoroughly to remove the shampoo.  Then [employer] and genitals (private parts) with your normal soap and rinse thoroughly to remove soap.  2. After that Use CHG Soap as you would any other liquid soap. You can apply CHG directly to the skin and wash gently  with a scrungie or a clean washcloth.   3. Apply the CHG Soap to your body ONLY FROM THE NECK DOWN.  Do not use on open wounds or open sores. Avoid contact with your eyes, ears, mouth and genitals (private parts). Wash Face and genitals (private parts)  with your normal soap.   4. Wash thoroughly, paying special attention to the area where your surgery will be performed.  5. Thoroughly rinse your body with warm water from the neck down.  6. DO NOT shower/wash with your normal soap after using and rinsing off the CHG Soap.  7. Pat yourself dry with a CLEAN TOWEL.  8. Wear CLEAN PAJAMAS to bed the night before surgery  9. Place CLEAN SHEETS on your bed the night before your surgery  10. DO NOT SLEEP WITH PETS.   Day of Surgery:  Take a shower with CHG soap. Wear Clean/Comfortable clothing the morning of surgery Do not apply any deodorants/lotions.   Remember to brush your teeth WITH YOUR REGULAR TOOTHPASTE.   Please read over the following fact sheets that you were given.

## 2020-08-15 NOTE — Telephone Encounter (Signed)
She should hold for 2 days prior to the surgery and restart 2 days after the surgery.  She should be off of it for 4 days total.  Deborah Soto. [name], MD 08/15/2020 8:33 AM

## 2020-08-15 NOTE — Telephone Encounter (Signed)
[facility] [phone]  Requesting very fication on when to stop Rx Xarelto prior to surgery  Surgery on 08/17/2020 Please advise

## 2020-08-15 NOTE — Telephone Encounter (Signed)
Patient and Lupita Leash at [facility] notified

## 2020-08-15 NOTE — Telephone Encounter (Signed)
Patient stated unsure if need to continue with Rx Tresiba 80 unit

## 2020-08-16 ENCOUNTER — Encounter (HOSPITAL_COMMUNITY)
Admission: RE | Admit: 2020-08-16 | Discharge: 2020-08-16 | Disposition: A | Discharge status: Home / Self Care | Payer: BC Managed Care – PPO | Source: Ambulatory Visit | Attending: Orthopedic Surgery | Admitting: Orthopedic Surgery

## 2020-08-16 ENCOUNTER — Other Ambulatory Visit: Payer: Self-pay

## 2020-08-16 ENCOUNTER — Encounter (HOSPITAL_COMMUNITY): Payer: Self-pay

## 2020-08-16 DIAGNOSIS — Z20822 Contact with and (suspected) exposure to covid-19: Secondary | ICD-10-CM | POA: Diagnosis not present

## 2020-08-16 DIAGNOSIS — Z01812 Encounter for preprocedural laboratory examination: Secondary | ICD-10-CM | POA: Diagnosis not present

## 2020-08-16 DIAGNOSIS — F332 Major depressive disorder, recurrent severe without psychotic features: Secondary | ICD-10-CM | POA: Diagnosis not present

## 2020-08-16 HISTORY — DX: Personal history of urinary calculi: Z87.442

## 2020-08-16 HISTORY — DX: Anemia, unspecified: D64.9

## 2020-08-16 HISTORY — DX: Dyspnea, unspecified: R06.00

## 2020-08-16 LAB — CBC WITH DIFFERENTIAL/PLATELET
Abs Immature Granulocytes: 0.03 10*3/uL (ref 0.00–0.07)
Basophils Absolute: 0 10*3/uL (ref 0.0–0.1)
Basophils Relative: 1 %
Eosinophils Absolute: 0 10*3/uL (ref 0.0–0.5)
Eosinophils Relative: 0 %
HCT: 44.4 % (ref 36.0–46.0)
Hemoglobin: 13.9 g/dL (ref 12.0–15.0)
Immature Granulocytes: 0 %
Lymphocytes Relative: 32 %
Lymphs Abs: 2.6 10*3/uL (ref 0.7–4.0)
MCH: 29.9 pg (ref 26.0–34.0)
MCHC: 31.3 g/dL (ref 30.0–36.0)
MCV: 95.5 fL (ref 80.0–100.0)
Monocytes Absolute: 0.7 10*3/uL (ref 0.1–1.0)
Monocytes Relative: 8 %
Neutro Abs: 4.8 10*3/uL (ref 1.7–7.7)
Neutrophils Relative %: 59 %
Platelets: 255 10*3/uL (ref 150–400)
RBC: 4.65 MIL/uL (ref 3.87–5.11)
RDW: 14.4 % (ref 11.5–15.5)
WBC: 8.2 10*3/uL (ref 4.0–10.5)
nRBC: 0 % (ref 0.0–0.2)

## 2020-08-16 LAB — SARS CORONAVIRUS 2 (TAT 6-24 HRS): SARS Coronavirus 2: NEGATIVE

## 2020-08-16 LAB — URINALYSIS, ROUTINE W REFLEX MICROSCOPIC
Bacteria, UA: NONE SEEN
Bilirubin Urine: NEGATIVE
Glucose, UA: >=500 mg/dL — AB
Hgb urine dipstick: NEGATIVE
Ketones, ur: 5 mg/dL — AB
Leukocytes,Ua: NEGATIVE
Nitrite: NEGATIVE
Protein, ur: NEGATIVE mg/dL
Specific Gravity, Urine: 1.037 — ABNORMAL HIGH (ref 1.005–1.030)
pH: 5 (ref 5.0–8.0)

## 2020-08-16 LAB — COMPREHENSIVE METABOLIC PANEL
ALT: 30 U/L (ref 0–44)
AST: 22 U/L (ref 15–41)
Albumin: 4 g/dL (ref 3.5–5.0)
Alkaline Phosphatase: 58 U/L (ref 38–126)
Anion gap: 10 (ref 5–15)
BUN: 12 mg/dL (ref 6–20)
CO2: 23 mmol/L (ref 22–32)
Calcium: 9.1 mg/dL (ref 8.9–10.3)
Chloride: 104 mmol/L (ref 98–111)
Creatinine, Ser: 0.46 mg/dL (ref 0.44–1.00)
GFR, Estimated: >60 mL/min (ref >60)
Glucose, Bld: 158 mg/dL — ABNORMAL HIGH (ref 70–99)
Potassium: 3.7 mmol/L (ref 3.5–5.1)
Sodium: 137 mmol/L (ref 135–145)
Total Bilirubin: 0.8 mg/dL (ref 0.3–1.2)
Total Protein: 6.8 g/dL (ref 6.5–8.1)

## 2020-08-16 LAB — GLUCOSE, CAPILLARY: Glucose-Capillary: 175 mg/dL — ABNORMAL HIGH (ref 70–99)

## 2020-08-16 LAB — TYPE AND SCREEN
ABO/RH(D): A POS
Antibody Screen: NEGATIVE

## 2020-08-16 LAB — SURGICAL PCR SCREEN
MRSA, PCR: NEGATIVE
Staphylococcus aureus: NEGATIVE

## 2020-08-16 LAB — PROTIME-INR
INR: 1.1 (ref 0.8–1.2)
Prothrombin Time: 13.9 seconds (ref 11.4–15.2)

## 2020-08-16 LAB — APTT: aPTT: 24 seconds (ref 24–36)

## 2020-08-16 NOTE — Progress Notes (Addendum)
Anesthesia Chart Review: SAME DAY WORK-UP   Case: [id number] Date/Time: 08/17/20 1245   Procedure: ANTERIOR CERVICAL DECOMPRESSION FUSION CERVICAL 3- CERVICAL 4, CERVICAL 4- CERVICAL 5, CERVICAL 5- CERVICAL 6 WITH INSTRUMENTATION AND ALLOGRAFT (N/A )   Anesthesia type: General   Pre-op diagnosis: RIGHT-SIDED CERVICAL RADICULOPATHY   Location: [facility] OR ROOM 18 / [facility] OR   Surgeons: Estill Bamberg, MD      DISCUSSION: Patient is a 55 year old female scheduled for the above procedure.  History includes former smoker (quit 03/12/04), OSA (uses CPAP), DVT/PE (PE/LLE DVT 1997, PE 03/2015), asthma, dyspnea, anemia, DM2, preeclampsia, HTN. BMI is consistent with obesity.   Per PCP Dr. [name], "She should hold for 2 days prior to the surgery and restart 2 days after the surgery.  She should be off of it for 4 days total." Per patient, last Xarelto 08/13/2020.  08/16/2020 presurgical COVID-19 test  in process.  Anesthesia team to evaluate on the day of surgery.   VS: BP (!) (P) 146/95   Pulse (!) (P) 106   Temp (P) 37.2 C (Oral)   Resp (P) 18   Ht (P) 5\' 8"  (1.727 m)   LMP  (LMP Unknown) Comment: last period ---  1 year ago (?)  SpO2 (P) 97%   BMI (P) 38.01 kg/m    PROVIDERS: , MD is PCP  - She is not followed routinely by cardiology but saw Ardith Dark, Will, MD on 04/24/15 for chest pain and had a normal stress test. Last visits in 2018 with 2019, MD for atypical symptoms and as needed cardiology follow-up recommended.    LABS: Labs reviewed: Acceptable for surgery. A1c 7.7% 06/23/20.  (all labs ordered are listed, but only abnormal results are displayed)  Labs Reviewed  GLUCOSE, CAPILLARY - Abnormal; Notable for the following components:      Result Value   Glucose-Capillary 175 (*)    All other components within normal limits  COMPREHENSIVE METABOLIC PANEL - Abnormal; Notable for the following components:   Glucose, Bld 158 (*)    All other components within normal limits   URINALYSIS, ROUTINE W REFLEX MICROSCOPIC - Abnormal; Notable for the following components:   APPearance HAZY (*)    Specific Gravity, Urine 1.037 (*)    Glucose, UA >=500 (*)    Ketones, ur 5 (*)    All other components within normal limits  SURGICAL PCR SCREEN  SARS CORONAVIRUS 2 (TAT 6-24 HRS)  APTT  CBC WITH DIFFERENTIAL/PLATELET  PROTIME-INR  TYPE AND SCREEN   PFTs 05/18/15: FVC 3.74 (91%), post 3.86 (94%). FEV1 3.13 (96%), post 3.31 (101%), DLCO unc 20.69 (69%).   IMAGES: MRI T-spine 07/26/20: IMPRESSION: 1. Multilevel disc protrusions, largest at T6-T7 with disc contacting and flattening the ventral cord. No significant canal stenosis. 2. Mild-to-moderate foraminal stenosis at T8-T9 and mild bilateral foraminal stenosis at T9-T10. 3. Normal cord signal.  MRI Brain 07/26/20: IMPRESSION: Normal appearance of the brain.  No evidence of acute abnormality.  MRI C-spine 05/04/20: IMPRESSION: - Multilevel degenerative change throughout the cervical spine as described above - Significant right foraminal encroachment C3-4, C4-5, and C5-6 due to spurring.   MRI L-spine 05/04/20:  IMPRESSION: Mild lumbar degenerative change. Negative for neural impingement or spinal stenosis.   EKG: 04/03/20: NSR   CV: Nuclear stress test 04/27/15:  Nuclear stress EF: 56%.  There was no ST segment deviation noted during stress.  The study is normal.  This is a low risk study.  The left  ventricular ejection fraction is normal (55-65%).  Normal pharmacologic nuclear study with no evidence for infarct or ischemia.  Echo 03/23/15: Study Conclusions  - Left ventricle: The cavity size was normal. Wall thickness was  increased in a pattern of mild LVH. Systolic function was normal.  The estimated ejection fraction was in the range of 60% to 65%.  Wall motion was normal; there were no regional wall motion  abnormalities.  - Atrial septum: No defect or patent foramen ovale  was identified.    Past Medical History:  Diagnosis Date  . Anemia   . Anxiety   . Asthma   . Depression   . Diabetes mellitus type 2 in obese (HCC) 06/11/2016  . Dyspnea   . Embolism - blood clot January 1997 & January 2017   Also had LLE DVT in 1997  . History of kidney stones   . Hypertension   . Preeclampsia 10/10/2011   1999   . Sleep apnea     Past Surgical History:  Procedure Laterality Date  . KIDNEY STONE SURGERY      MEDICATIONS: . amitriptyline (ELAVIL) 50 MG tablet  . ampicillin (PRINCIPEN) 500 MG capsule  . celecoxib (CELEBREX) 200 MG capsule  . Cholecalciferol (VITAMIN D) 50 MCG (2000 UT) tablet  . cyanocobalamin (,VITAMIN B-12,) 1000 MCG/ML injection  . Empagliflozin-metFORMIN HCl ER (SYNJARDY XR) 25-1000 MG TB24  . Esketamine HCl, 84 MG Dose, (SPRAVATO, 84 MG DOSE,) 28 MG/DEVICE SOPK  . gabapentin (NEURONTIN) 300 MG capsule  . JARDIANCE 25 MG TABS tablet  . lithium carbonate (LITHOBID) 300 MG CR tablet  . Multiple Vitamin (MULTIVITAMIN WITH MINERALS) TABS tablet  . OLANZapine (ZYPREXA) 10 MG tablet  . pantoprazole (PROTONIX) 40 MG tablet  . propranolol (INDERAL) 40 MG tablet  . rivaroxaban (XARELTO) 20 MG TABS tablet  . Semaglutide, 1 MG/DOSE, (OZEMPIC, 1 MG/DOSE,) 4 MG/3ML SOPN  . TRESIBA FLEXTOUCH 100 UNIT/ML FlexTouch Pen  . tretinoin (RETIN-A) 0.1 % cream  . Vilazodone HCl (VIIBRYD) 40 MG TABS   No current facility-administered medications for this encounter.    Shonna Chock, PA-C Surgical Short Stay/Anesthesiology [facility] Phone [phone] [facility] Phone [phone] 08/16/2020 3:18 PM

## 2020-08-16 NOTE — Progress Notes (Signed)
PCP: Jacquiline Doe, MD Cardiologist: Loman Brooklyn, MD  EKG: 04/03/20 CXR: na ECHO: 03/23/15 Stress Test: 04/17/15 Cardiac Cath: denies  Fasting Blood Sugar- 175-250 Checks Blood Sugar__10_ times a day Wears glucose meter on left arm BG at PAT was 175  OSA/CPAP: Yes, wears nightly  ASA: No Blood Thinner: Last dose Xarelto 6/4.  Takes for DVT/PE  Covid test 08/16/20 at PAT  Anesthesia Review: Yes, history of cardiac testing.   Patient denies shortness of breath, fever, cough, and chest pain at PAT appointment.  Patient verbalized understanding of instructions provided today at the PAT appointment.  Patient asked to review instructions at home and day of surgery.

## 2020-08-16 NOTE — Anesthesia Preprocedure Evaluation (Addendum)
Anesthesia Evaluation  Patient identified by MRN, date of birth, ID band Patient awake    Reviewed: Allergy & Precautions, NPO status , Patient's Chart, lab work & pertinent test results  Airway Mallampati: III  TM Distance: <3 FB Neck ROM: Full    Dental  (+) Teeth Intact, Dental Advisory Given   Pulmonary asthma , sleep apnea , former smoker,    breath sounds clear to auscultation       Cardiovascular hypertension,  Rhythm:Regular Rate:Normal     Neuro/Psych PSYCHIATRIC DISORDERS Anxiety Depression  Neuromuscular disease    GI/Hepatic Neg liver ROS, GERD  Medicated,  Endo/Other  diabetes  Renal/GU negative Renal ROS     Musculoskeletal negative musculoskeletal ROS (+)   Abdominal (+) + obese,   Peds  Hematology negative hematology ROS (+)   Anesthesia Other Findings RUE tingling and pain  Reproductive/Obstetrics                          Anesthesia Physical Anesthesia Plan  ASA: II  Anesthesia Plan: General   Post-op Pain Management:    Induction: Intravenous  PONV Risk Score and Plan: 4 or greater and Ondansetron, Dexamethasone, Midazolam and Scopolamine patch - Pre-op  Airway Management Planned: Oral ETT and Video Laryngoscope Planned  Additional Equipment: None  Intra-op Plan:   Post-operative Plan: Extubation in OR  Informed Consent: I have reviewed the patients History and Physical, chart, labs and discussed the procedure including the risks, benefits and alternatives for the proposed anesthesia with the patient or authorized representative who has indicated his/her understanding and acceptance.     Dental advisory given  Plan Discussed with: CRNA  Anesthesia Plan Comments: (PAT note written 08/16/2020 by Shonna Chock, PA-C. )       Anesthesia Quick Evaluation

## 2020-08-17 ENCOUNTER — Other Ambulatory Visit: Payer: Self-pay | Admitting: Psychiatry

## 2020-08-17 ENCOUNTER — Ambulatory Visit (HOSPITAL_COMMUNITY): Payer: BC Managed Care – PPO

## 2020-08-17 ENCOUNTER — Ambulatory Visit (HOSPITAL_COMMUNITY): Payer: BC Managed Care – PPO | Admitting: Vascular Surgery

## 2020-08-17 ENCOUNTER — Encounter (HOSPITAL_COMMUNITY): Payer: Self-pay | Admitting: Orthopedic Surgery

## 2020-08-17 ENCOUNTER — Encounter (HOSPITAL_COMMUNITY)
Admission: RE | Disposition: A | Discharge status: Home / Self Care | Payer: Self-pay | Source: Home / Self Care | Attending: Orthopedic Surgery

## 2020-08-17 ENCOUNTER — Observation Stay (HOSPITAL_COMMUNITY)
Admission: RE | Admit: 2020-08-17 | Discharge: 2020-08-18 | Disposition: A | Discharge status: Home / Self Care | Payer: BC Managed Care – PPO | Attending: Orthopedic Surgery | Admitting: Orthopedic Surgery

## 2020-08-17 ENCOUNTER — Ambulatory Visit: Payer: BC Managed Care – PPO | Admitting: Psychiatry

## 2020-08-17 DIAGNOSIS — Z7901 Long term (current) use of anticoagulants: Secondary | ICD-10-CM | POA: Diagnosis not present

## 2020-08-17 DIAGNOSIS — J45909 Unspecified asthma, uncomplicated: Secondary | ICD-10-CM | POA: Insufficient documentation

## 2020-08-17 DIAGNOSIS — F411 Generalized anxiety disorder: Secondary | ICD-10-CM

## 2020-08-17 DIAGNOSIS — I10 Essential (primary) hypertension: Secondary | ICD-10-CM | POA: Diagnosis not present

## 2020-08-17 DIAGNOSIS — Z7984 Long term (current) use of oral hypoglycemic drugs: Secondary | ICD-10-CM | POA: Diagnosis not present

## 2020-08-17 DIAGNOSIS — Z87891 Personal history of nicotine dependence: Secondary | ICD-10-CM | POA: Diagnosis not present

## 2020-08-17 DIAGNOSIS — Z79899 Other long term (current) drug therapy: Secondary | ICD-10-CM | POA: Insufficient documentation

## 2020-08-17 DIAGNOSIS — M5412 Radiculopathy, cervical region: Principal | ICD-10-CM | POA: Insufficient documentation

## 2020-08-17 DIAGNOSIS — M5410 Radiculopathy, site unspecified: Secondary | ICD-10-CM | POA: Diagnosis present

## 2020-08-17 DIAGNOSIS — Z419 Encounter for procedure for purposes other than remedying health state, unspecified: Secondary | ICD-10-CM

## 2020-08-17 DIAGNOSIS — F332 Major depressive disorder, recurrent severe without psychotic features: Secondary | ICD-10-CM

## 2020-08-17 DIAGNOSIS — M4802 Spinal stenosis, cervical region: Secondary | ICD-10-CM | POA: Diagnosis not present

## 2020-08-17 DIAGNOSIS — M9961 Osseous and subluxation stenosis of intervertebral foramina of cervical region: Secondary | ICD-10-CM | POA: Diagnosis not present

## 2020-08-17 DIAGNOSIS — E119 Type 2 diabetes mellitus without complications: Secondary | ICD-10-CM | POA: Insufficient documentation

## 2020-08-17 DIAGNOSIS — Z981 Arthrodesis status: Secondary | ICD-10-CM | POA: Diagnosis not present

## 2020-08-17 DIAGNOSIS — M4322 Fusion of spine, cervical region: Secondary | ICD-10-CM | POA: Diagnosis not present

## 2020-08-17 HISTORY — PX: ANTERIOR CERVICAL DECOMPRESSION/DISCECTOMY FUSION 4 LEVELS: SHX5556

## 2020-08-17 LAB — GLUCOSE, CAPILLARY
Glucose-Capillary: 268 mg/dL — ABNORMAL HIGH (ref 70–99)
Glucose-Capillary: 214 mg/dL — ABNORMAL HIGH (ref 70–99)
Glucose-Capillary: 251 mg/dL — ABNORMAL HIGH (ref 70–99)
Glucose-Capillary: 262 mg/dL — ABNORMAL HIGH (ref 70–99)
Glucose-Capillary: 222 mg/dL — ABNORMAL HIGH (ref 70–99)
Glucose-Capillary: 272 mg/dL — ABNORMAL HIGH (ref 70–99)

## 2020-08-17 LAB — ABO/RH: ABO/RH(D): A POS

## 2020-08-17 SURGERY — ANTERIOR CERVICAL DECOMPRESSION/DISCECTOMY FUSION 4 LEVELS
Anesthesia: General

## 2020-08-17 MED ORDER — HYDROMORPHONE HCL 1 MG/ML IJ SOLN
0.5000 mg | INTRAMUSCULAR | Status: AC
  Administered 2020-08-17: 0.5 mg via INTRAVENOUS
  Filled 2020-08-17: qty 0.5

## 2020-08-17 MED ORDER — GABAPENTIN 300 MG PO CAPS
600.0000 mg | ORAL_CAPSULE | Freq: Two times a day (BID) | ORAL | Status: DC
  Administered 2020-08-17 – 2020-08-18 (×2): 600 mg via ORAL
  Filled 2020-08-17 (×2): qty 2

## 2020-08-17 MED ORDER — HEMOSTATIC AGENTS (NO CHARGE) OPTIME
TOPICAL | Status: DC | PRN
  Administered 2020-08-17: 1 via TOPICAL

## 2020-08-17 MED ORDER — SENNOSIDES-DOCUSATE SODIUM 8.6-50 MG PO TABS: 1.0000 | ORAL_TABLET | Freq: Every evening | ORAL | Status: DC | PRN

## 2020-08-17 MED ORDER — AMISULPRIDE (ANTIEMETIC) 5 MG/2ML IV SOLN: 10.0000 mg | Freq: Once | INTRAVENOUS | Status: DC | PRN

## 2020-08-17 MED ORDER — PROPRANOLOL HCL 40 MG PO TABS
40.0000 mg | ORAL_TABLET | Freq: Two times a day (BID) | ORAL | Status: DC
  Administered 2020-08-17 – 2020-08-18 (×2): 40 mg via ORAL
  Filled 2020-08-17 (×2): qty 1

## 2020-08-17 MED ORDER — DEXAMETHASONE SODIUM PHOSPHATE 10 MG/ML IJ SOLN
INTRAMUSCULAR | Status: DC | PRN
  Administered 2020-08-17: 5 mg via INTRAVENOUS

## 2020-08-17 MED ORDER — ONDANSETRON HCL 4 MG/2ML IJ SOLN: 4.0000 mg | Freq: Four times a day (QID) | INTRAMUSCULAR | Status: DC | PRN

## 2020-08-17 MED ORDER — KETAMINE HCL 10 MG/ML IJ SOLN
INTRAMUSCULAR | Status: DC | PRN
  Administered 2020-08-17 (×2): 25 mg via INTRAVENOUS

## 2020-08-17 MED ORDER — INSULIN ASPART 100 UNIT/ML IJ SOLN
INTRAMUSCULAR | Status: AC
  Filled 2020-08-17: qty 1

## 2020-08-17 MED ORDER — INSULIN ASPART 100 UNIT/ML IJ SOLN
0.0000 [IU] | Freq: Three times a day (TID) | INTRAMUSCULAR | Status: DC
  Administered 2020-08-18: 5 [IU] via SUBCUTANEOUS

## 2020-08-17 MED ORDER — METHOCARBAMOL 500 MG PO TABS
500.0000 mg | ORAL_TABLET | Freq: Four times a day (QID) | ORAL | Status: DC | PRN
  Administered 2020-08-17 – 2020-08-18 (×3): 500 mg via ORAL
  Filled 2020-08-17 (×3): qty 1

## 2020-08-17 MED ORDER — VILAZODONE HCL 40 MG PO TABS: 60.0000 mg | ORAL_TABLET | Freq: Every day | ORAL | Status: DC

## 2020-08-17 MED ORDER — METFORMIN HCL ER 500 MG PO TB24
1000.0000 mg | ORAL_TABLET | Freq: Every day | ORAL | Status: DC
  Administered 2020-08-18: 1000 mg via ORAL
  Filled 2020-08-17: qty 2

## 2020-08-17 MED ORDER — ACETAMINOPHEN 10 MG/ML IV SOLN
INTRAVENOUS | Status: AC
  Filled 2020-08-17: qty 100

## 2020-08-17 MED ORDER — HYDROMORPHONE HCL 1 MG/ML IJ SOLN
INTRAMUSCULAR | Status: AC
  Filled 2020-08-17: qty 1

## 2020-08-17 MED ORDER — PROPOFOL 10 MG/ML IV BOLUS
INTRAVENOUS | Status: DC | PRN
  Administered 2020-08-17: 50 mg via INTRAVENOUS
  Administered 2020-08-17: 150 mg via INTRAVENOUS

## 2020-08-17 MED ORDER — LACTATED RINGERS IV SOLN: INTRAVENOUS | Status: DC

## 2020-08-17 MED ORDER — GABAPENTIN 300 MG PO CAPS: 300.0000 mg | ORAL_CAPSULE | Freq: Every day | ORAL | Status: DC

## 2020-08-17 MED ORDER — ACETAMINOPHEN 10 MG/ML IV SOLN
INTRAVENOUS | Status: DC | PRN
  Administered 2020-08-17: 1000 mg via INTRAVENOUS

## 2020-08-17 MED ORDER — HYDROCODONE-ACETAMINOPHEN 5-325 MG PO TABS
2.0000 | ORAL_TABLET | ORAL | Status: DC | PRN
  Filled 2020-08-17: qty 2

## 2020-08-17 MED ORDER — THROMBIN 20000 UNITS EX SOLR
CUTANEOUS | Status: DC | PRN
  Administered 2020-08-17: 20000 [IU] via TOPICAL

## 2020-08-17 MED ORDER — CEFAZOLIN SODIUM-DEXTROSE 2-4 GM/100ML-% IV SOLN
2.0000 g | INTRAVENOUS | Status: AC
  Administered 2020-08-17: 2 g via INTRAVENOUS
  Filled 2020-08-17: qty 100

## 2020-08-17 MED ORDER — ONDANSETRON HCL 4 MG/2ML IJ SOLN
INTRAMUSCULAR | Status: DC | PRN
  Administered 2020-08-17 (×2): 4 mg via INTRAVENOUS

## 2020-08-17 MED ORDER — GABAPENTIN 300 MG PO CAPS: 300.0000 mg | ORAL_CAPSULE | ORAL | Status: DC

## 2020-08-17 MED ORDER — ACETAMINOPHEN 10 MG/ML IV SOLN: 1000.0000 mg | Freq: Once | INTRAVENOUS | Status: DC | PRN

## 2020-08-17 MED ORDER — ONDANSETRON HCL 4 MG/2ML IJ SOLN
INTRAMUSCULAR | Status: AC
  Filled 2020-08-17: qty 2

## 2020-08-17 MED ORDER — INSULIN DEGLUDEC 100 UNIT/ML ~~LOC~~ SOPN: 80.0000 [IU] | PEN_INJECTOR | Freq: Every day | SUBCUTANEOUS | Status: DC

## 2020-08-17 MED ORDER — ACETAMINOPHEN 325 MG PO TABS
650.0000 mg | ORAL_TABLET | ORAL | Status: DC | PRN
  Administered 2020-08-17 – 2020-08-18 (×2): 650 mg via ORAL
  Filled 2020-08-17 (×2): qty 2

## 2020-08-17 MED ORDER — BUPIVACAINE-EPINEPHRINE (PF) 0.25% -1:200000 IJ SOLN
INTRAMUSCULAR | Status: AC
  Filled 2020-08-17: qty 30

## 2020-08-17 MED ORDER — OXYCODONE HCL 5 MG PO TABS
5.0000 mg | ORAL_TABLET | Freq: Once | ORAL | Status: AC | PRN
  Administered 2020-08-17: 5 mg via ORAL

## 2020-08-17 MED ORDER — ACETAMINOPHEN 650 MG RE SUPP: 650.0000 mg | RECTAL | Status: DC | PRN

## 2020-08-17 MED ORDER — THROMBIN 20000 UNITS EX SOLR
CUTANEOUS | Status: AC
  Filled 2020-08-17: qty 20000

## 2020-08-17 MED ORDER — BISACODYL 5 MG PO TBEC
5.0000 mg | DELAYED_RELEASE_TABLET | Freq: Every day | ORAL | Status: DC | PRN
Start: 2020-08-17 — End: 2020-08-18

## 2020-08-17 MED ORDER — PHENOL 1.4 % MT LIQD: 1.0000 | OROMUCOSAL | Status: DC | PRN

## 2020-08-17 MED ORDER — ACETAMINOPHEN 325 MG PO TABS: 325.0000 mg | ORAL_TABLET | Freq: Once | ORAL | Status: DC | PRN

## 2020-08-17 MED ORDER — ONDANSETRON HCL 4 MG PO TABS: 4.0000 mg | ORAL_TABLET | Freq: Four times a day (QID) | ORAL | Status: DC | PRN

## 2020-08-17 MED ORDER — PANTOPRAZOLE SODIUM 40 MG PO TBEC
40.0000 mg | DELAYED_RELEASE_TABLET | Freq: Every day | ORAL | Status: DC
  Administered 2020-08-17: 40 mg via ORAL
  Filled 2020-08-17: qty 1

## 2020-08-17 MED ORDER — HYDROMORPHONE HCL 1 MG/ML IJ SOLN: 0.2500 mg | INTRAMUSCULAR | Status: DC | PRN

## 2020-08-17 MED ORDER — EMPAGLIFLOZIN 25 MG PO TABS
25.0000 mg | ORAL_TABLET | Freq: Every day | ORAL | Status: DC
  Administered 2020-08-18: 25 mg via ORAL
  Filled 2020-08-17: qty 1

## 2020-08-17 MED ORDER — PROMETHAZINE HCL 25 MG/ML IJ SOLN: 6.2500 mg | INTRAMUSCULAR | Status: DC | PRN

## 2020-08-17 MED ORDER — ALBUTEROL SULFATE HFA 108 (90 BASE) MCG/ACT IN AERS
INHALATION_SPRAY | RESPIRATORY_TRACT | Status: AC
  Filled 2020-08-17: qty 6.7

## 2020-08-17 MED ORDER — MIDAZOLAM HCL 2 MG/2ML IJ SOLN
INTRAMUSCULAR | Status: AC
  Filled 2020-08-17: qty 2

## 2020-08-17 MED ORDER — EMPAGLIFLOZIN-METFORMIN HCL ER 25-1000 MG PO TB24: 1.0000 | ORAL_TABLET | Freq: Every day | ORAL | Status: DC

## 2020-08-17 MED ORDER — INSULIN ASPART 100 UNIT/ML IJ SOLN
5.0000 [IU] | Freq: Once | INTRAMUSCULAR | Status: AC
  Administered 2020-08-17: 5 [IU] via SUBCUTANEOUS

## 2020-08-17 MED ORDER — VITAMIN D 25 MCG (1000 UNIT) PO TABS
2000.0000 [IU] | ORAL_TABLET | Freq: Every day | ORAL | Status: DC
  Administered 2020-08-18: 2000 [IU] via ORAL
  Filled 2020-08-17 (×2): qty 2

## 2020-08-17 MED ORDER — OXYCODONE HCL 5 MG PO TABS
10.0000 mg | ORAL_TABLET | ORAL | Status: DC | PRN
  Administered 2020-08-17 – 2020-08-18 (×5): 10 mg via ORAL
  Filled 2020-08-17 (×5): qty 2

## 2020-08-17 MED ORDER — 0.9 % SODIUM CHLORIDE (POUR BTL) OPTIME
TOPICAL | Status: DC | PRN
  Administered 2020-08-17: 1000 mL

## 2020-08-17 MED ORDER — CHLORHEXIDINE GLUCONATE 0.12 % MT SOLN: 15.0000 mL | Freq: Once | OROMUCOSAL | Status: AC

## 2020-08-17 MED ORDER — ALUM & MAG HYDROXIDE-SIMETH 200-200-20 MG/5ML PO SUSP: 30.0000 mL | Freq: Four times a day (QID) | ORAL | Status: DC | PRN

## 2020-08-17 MED ORDER — SODIUM CHLORIDE 0.9 % IV SOLN: 250.0000 mL | INTRAVENOUS | Status: DC

## 2020-08-17 MED ORDER — FENTANYL CITRATE (PF) 250 MCG/5ML IJ SOLN
INTRAMUSCULAR | Status: DC | PRN
  Administered 2020-08-17: 100 ug via INTRAVENOUS
  Administered 2020-08-17: 50 ug via INTRAVENOUS
  Administered 2020-08-17: 100 ug via INTRAVENOUS

## 2020-08-17 MED ORDER — MIDAZOLAM HCL 2 MG/2ML IJ SOLN
INTRAMUSCULAR | Status: DC | PRN
  Administered 2020-08-17: 2 mg via INTRAVENOUS

## 2020-08-17 MED ORDER — SUGAMMADEX SODIUM 200 MG/2ML IV SOLN
INTRAVENOUS | Status: DC | PRN
  Administered 2020-08-17: 200 mg via INTRAVENOUS

## 2020-08-17 MED ORDER — SODIUM CHLORIDE 0.9% FLUSH: 3.0000 mL | INTRAVENOUS | Status: DC | PRN

## 2020-08-17 MED ORDER — FENTANYL CITRATE (PF) 250 MCG/5ML IJ SOLN
INTRAMUSCULAR | Status: AC
  Filled 2020-08-17: qty 5

## 2020-08-17 MED ORDER — LIDOCAINE 2% (20 MG/ML) 5 ML SYRINGE
INTRAMUSCULAR | Status: DC | PRN
  Administered 2020-08-17: 40 mg via INTRAVENOUS

## 2020-08-17 MED ORDER — SUCCINYLCHOLINE CHLORIDE 200 MG/10ML IV SOSY
PREFILLED_SYRINGE | INTRAVENOUS | Status: DC | PRN
  Administered 2020-08-17: 120 mg via INTRAVENOUS

## 2020-08-17 MED ORDER — SODIUM CHLORIDE 0.9% FLUSH
3.0000 mL | Freq: Two times a day (BID) | INTRAVENOUS | Status: DC
  Administered 2020-08-18: 3 mL via INTRAVENOUS

## 2020-08-17 MED ORDER — EMPAGLIFLOZIN 25 MG PO TABS: 25.0000 mg | ORAL_TABLET | Freq: Every day | ORAL | Status: DC

## 2020-08-17 MED ORDER — ALBUTEROL SULFATE HFA 108 (90 BASE) MCG/ACT IN AERS
INHALATION_SPRAY | RESPIRATORY_TRACT | Status: DC | PRN
  Administered 2020-08-17: 3 via RESPIRATORY_TRACT

## 2020-08-17 MED ORDER — KETAMINE HCL 50 MG/5ML IJ SOSY
PREFILLED_SYRINGE | INTRAMUSCULAR | Status: AC
  Filled 2020-08-17: qty 5

## 2020-08-17 MED ORDER — CEFAZOLIN SODIUM-DEXTROSE 2-4 GM/100ML-% IV SOLN
2.0000 g | Freq: Three times a day (TID) | INTRAVENOUS | Status: AC
  Administered 2020-08-17 – 2020-08-18 (×2): 2 g via INTRAVENOUS
  Filled 2020-08-17 (×2): qty 100

## 2020-08-17 MED ORDER — METHOCARBAMOL 1000 MG/10ML IJ SOLN
500.0000 mg | Freq: Four times a day (QID) | INTRAMUSCULAR | Status: DC | PRN
  Filled 2020-08-17 (×2): qty 5

## 2020-08-17 MED ORDER — PHENYLEPHRINE HCL-NACL 10-0.9 MG/250ML-% IV SOLN
INTRAVENOUS | Status: DC | PRN
  Administered 2020-08-17: 20 ug/min via INTRAVENOUS

## 2020-08-17 MED ORDER — FLEET ENEMA 7-19 GM/118ML RE ENEM: 1.0000 | ENEMA | Freq: Once | RECTAL | Status: DC | PRN

## 2020-08-17 MED ORDER — ORAL CARE MOUTH RINSE: 15.0000 mL | Freq: Once | OROMUCOSAL | Status: AC

## 2020-08-17 MED ORDER — POVIDONE-IODINE 7.5 % EX SOLN
Freq: Once | CUTANEOUS | Status: DC
  Filled 2020-08-17: qty 118

## 2020-08-17 MED ORDER — VILAZODONE HCL 20 MG PO TABS
60.0000 mg | ORAL_TABLET | Freq: Every day | ORAL | Status: DC
  Filled 2020-08-17: qty 3

## 2020-08-17 MED ORDER — INSULIN GLARGINE 100 UNIT/ML ~~LOC~~ SOLN
80.0000 [IU] | Freq: Every day | SUBCUTANEOUS | Status: DC
  Administered 2020-08-17: 80 [IU] via SUBCUTANEOUS
  Filled 2020-08-17 (×2): qty 0.8

## 2020-08-17 MED ORDER — LITHIUM CARBONATE ER 450 MG PO TBCR
900.0000 mg | EXTENDED_RELEASE_TABLET | Freq: Every day | ORAL | Status: DC
  Administered 2020-08-17: 900 mg via ORAL
  Filled 2020-08-17: qty 2

## 2020-08-17 MED ORDER — MENTHOL 3 MG MT LOZG: 1.0000 | LOZENGE | OROMUCOSAL | Status: DC | PRN

## 2020-08-17 MED ORDER — OLANZAPINE 10 MG PO TABS
10.0000 mg | ORAL_TABLET | Freq: Every day | ORAL | Status: DC
  Administered 2020-08-17: 10 mg via ORAL
  Filled 2020-08-17: qty 1

## 2020-08-17 MED ORDER — OXYCODONE HCL 5 MG PO TABS
ORAL_TABLET | ORAL | Status: AC
  Filled 2020-08-17: qty 1

## 2020-08-17 MED ORDER — AMPICILLIN 500 MG PO CAPS: 500.0000 mg | ORAL_CAPSULE | Freq: Every day | ORAL | Status: DC

## 2020-08-17 MED ORDER — MEPERIDINE HCL 25 MG/ML IJ SOLN
6.2500 mg | INTRAMUSCULAR | Status: DC | PRN
Start: 2020-08-17 — End: 2020-08-17

## 2020-08-17 MED ORDER — BUPIVACAINE-EPINEPHRINE 0.25% -1:200000 IJ SOLN
INTRAMUSCULAR | Status: DC | PRN
  Administered 2020-08-17: 6 mL

## 2020-08-17 MED ORDER — SCOPOLAMINE 1 MG/3DAYS TD PT72
MEDICATED_PATCH | TRANSDERMAL | Status: AC
  Filled 2020-08-17: qty 1

## 2020-08-17 MED ORDER — HYDROMORPHONE HCL 1 MG/ML IJ SOLN
0.2500 mg | INTRAMUSCULAR | Status: DC | PRN
  Administered 2020-08-17: 0.25 mg via INTRAVENOUS
  Administered 2020-08-17: 0.5 mg via INTRAVENOUS
  Administered 2020-08-17 (×2): 0.25 mg via INTRAVENOUS
  Administered 2020-08-17: 0.5 mg via INTRAVENOUS
  Administered 2020-08-17: 0.25 mg via INTRAVENOUS

## 2020-08-17 MED ORDER — HYDROCODONE-ACETAMINOPHEN 5-325 MG PO TABS: 1.0000 | ORAL_TABLET | ORAL | Status: DC | PRN

## 2020-08-17 MED ORDER — SEMAGLUTIDE (1 MG/DOSE) 4 MG/3ML ~~LOC~~ SOPN: 2.0000 mg | PEN_INJECTOR | SUBCUTANEOUS | Status: DC

## 2020-08-17 MED ORDER — CHLORHEXIDINE GLUCONATE 0.12 % MT SOLN
OROMUCOSAL | Status: AC
  Administered 2020-08-17: 15 mL via OROMUCOSAL
  Filled 2020-08-17: qty 15

## 2020-08-17 MED ORDER — SCOPOLAMINE 1 MG/3DAYS TD PT72
1.0000 | MEDICATED_PATCH | TRANSDERMAL | Status: DC
  Administered 2020-08-17: 1 via TRANSDERMAL

## 2020-08-17 MED ORDER — ROCURONIUM BROMIDE 10 MG/ML (PF) SYRINGE
PREFILLED_SYRINGE | INTRAVENOUS | Status: DC | PRN
  Administered 2020-08-17: 20 mg via INTRAVENOUS
  Administered 2020-08-17: 50 mg via INTRAVENOUS
  Administered 2020-08-17: 20 mg via INTRAVENOUS

## 2020-08-17 MED ORDER — DEXMEDETOMIDINE (PRECEDEX) IN NS 20 MCG/5ML (4 MCG/ML) IV SYRINGE
PREFILLED_SYRINGE | INTRAVENOUS | Status: AC
  Filled 2020-08-17: qty 5

## 2020-08-17 MED ORDER — DEXMEDETOMIDINE (PRECEDEX) IN NS 20 MCG/5ML (4 MCG/ML) IV SYRINGE
PREFILLED_SYRINGE | INTRAVENOUS | Status: DC | PRN
  Administered 2020-08-17: 4 ug via INTRAVENOUS
  Administered 2020-08-17 (×2): 8 ug via INTRAVENOUS

## 2020-08-17 MED ORDER — PANTOPRAZOLE SODIUM 40 MG IV SOLR: 40.0000 mg | Freq: Every day | INTRAVENOUS | Status: DC

## 2020-08-17 MED ORDER — ACETAMINOPHEN 160 MG/5ML PO SOLN: 325.0000 mg | Freq: Once | ORAL | Status: DC | PRN

## 2020-08-17 MED ORDER — OXYCODONE HCL 5 MG/5ML PO SOLN: 5.0000 mg | Freq: Once | ORAL | Status: AC | PRN

## 2020-08-17 MED ORDER — AMITRIPTYLINE HCL 50 MG PO TABS
50.0000 mg | ORAL_TABLET | Freq: Every day | ORAL | Status: DC
  Administered 2020-08-17: 50 mg via ORAL
  Filled 2020-08-17: qty 1

## 2020-08-17 MED ORDER — OXYCODONE HCL 5 MG PO TABS: 10.0000 mg | ORAL_TABLET | ORAL | Status: DC | PRN

## 2020-08-17 MED ORDER — DOCUSATE SODIUM 100 MG PO CAPS
100.0000 mg | ORAL_CAPSULE | Freq: Two times a day (BID) | ORAL | Status: DC
  Administered 2020-08-17 – 2020-08-18 (×2): 100 mg via ORAL
  Filled 2020-08-17 (×2): qty 1

## 2020-08-17 MED ORDER — ZOLPIDEM TARTRATE 5 MG PO TABS: 5.0000 mg | ORAL_TABLET | Freq: Every evening | ORAL | Status: DC | PRN

## 2020-08-17 SURGICAL SUPPLY — 78 items
AGENT HMST KT MTR STRL THRMB (HEMOSTASIS)
APL SKNCLS STERI-STRIP NONHPOA (GAUZE/BANDAGES/DRESSINGS) ×1
BENZOIN TINCTURE PRP APPL 2/3 (GAUZE/BANDAGES/DRESSINGS) ×2 IMPLANT
BIT DRILL NEURO 2X3.1 SFT TUCH (MISCELLANEOUS) ×1 IMPLANT
BIT DRILL SKYLINE 12MM (BIT) IMPLANT
BLADE CLIPPER SURG (BLADE) ×2 IMPLANT
BLADE SURG 15 STRL LF DISP TIS (BLADE) ×1 IMPLANT
BLADE SURG 15 STRL SS (BLADE) ×2
BONE VIVIGEN FORMABLE 1.3CC (Bone Implant) ×4 IMPLANT
BUR MATCHSTICK NEURO 3.0 LAGG (BURR) ×1 IMPLANT
CARTRIDGE OIL MAESTRO DRILL (MISCELLANEOUS) ×1 IMPLANT
CLSR STERI-STRIP ANTIMIC 1/2X4 (GAUZE/BANDAGES/DRESSINGS) ×1 IMPLANT
COVER SURGICAL LIGHT HANDLE (MISCELLANEOUS) ×1 IMPLANT
COVER WAND RF STERILE (DRAPES) ×1 IMPLANT
DECANTER SPIKE VIAL GLASS SM (MISCELLANEOUS) ×2 IMPLANT
DIFFUSER DRILL AIR PNEUMATIC (MISCELLANEOUS) ×2 IMPLANT
DRAIN JACKSON RD 7FR 3/32 (WOUND CARE) IMPLANT
DRAPE C-ARM 42X72 X-RAY (DRAPES) ×2 IMPLANT
DRAPE POUCH INSTRU U-SHP 10X18 (DRAPES) ×2 IMPLANT
DRAPE SHEET LG 3/4 BI-LAMINATE (DRAPES) ×1 IMPLANT
DRAPE SURG 17X23 STRL (DRAPES) ×6 IMPLANT
DRILL BIT SKYLINE 12MM (BIT) ×2
DRILL NEURO 2X3.1 SOFT TOUCH (MISCELLANEOUS) ×2
DURAPREP 26ML APPLICATOR (WOUND CARE) ×2 IMPLANT
ELECT COATED BLADE 2.86 ST (ELECTRODE) ×2 IMPLANT
ELECT REM PT RETURN 9FT ADLT (ELECTROSURGICAL) ×2
ELECTRODE REM PT RTRN 9FT ADLT (ELECTROSURGICAL) ×1 IMPLANT
EVACUATOR SILICONE 100CC (DRAIN) IMPLANT
GAUZE 4X4 16PLY RFD (DISPOSABLE) ×2 IMPLANT
GAUZE 4X4 16PLY ~~LOC~~+RFID DBL (SPONGE) ×1 IMPLANT
GAUZE SPONGE 4X4 12PLY STRL (GAUZE/BANDAGES/DRESSINGS) ×2 IMPLANT
GLOVE BIO SURGEON STRL SZ7 (GLOVE) ×2 IMPLANT
GLOVE BIO SURGEON STRL SZ8 (GLOVE) ×2 IMPLANT
GLOVE SRG 8 PF TXTR STRL LF DI (GLOVE) ×1 IMPLANT
GLOVE SURG UNDER POLY LF SZ7 (GLOVE) ×4 IMPLANT
GLOVE SURG UNDER POLY LF SZ8 (GLOVE) ×2
GOWN STRL REUS W/ TWL LRG LVL3 (GOWN DISPOSABLE) ×1 IMPLANT
GOWN STRL REUS W/ TWL XL LVL3 (GOWN DISPOSABLE) ×1 IMPLANT
GOWN STRL REUS W/TWL LRG LVL3 (GOWN DISPOSABLE) ×2
GOWN STRL REUS W/TWL XL LVL3 (GOWN DISPOSABLE) ×2
GRAFT BNE MATRIX VG FRMBL SM 1 (Bone Implant) IMPLANT
INTERLOCK LRDTC CRVCL VBR 7MM (Bone Implant) IMPLANT
IV CATH 14GX2 1/4 (CATHETERS) ×2 IMPLANT
KIT BASIN OR (CUSTOM PROCEDURE TRAY) ×2 IMPLANT
KIT TURNOVER KIT B (KITS) ×2 IMPLANT
LORDOTIC CERVICAL VBR 7MM SM (Bone Implant) ×6 IMPLANT
MANIFOLD NEPTUNE II (INSTRUMENTS) ×2 IMPLANT
NDL PRECISIONGLIDE 27X1.5 (NEEDLE) ×1 IMPLANT
NDL SPNL 20GX3.5 QUINCKE YW (NEEDLE) ×1 IMPLANT
NEEDLE PRECISIONGLIDE 27X1.5 (NEEDLE) ×2 IMPLANT
NEEDLE SPNL 20GX3.5 QUINCKE YW (NEEDLE) ×2 IMPLANT
NS IRRIG 1000ML POUR BTL (IV SOLUTION) ×2 IMPLANT
OIL CARTRIDGE MAESTRO DRILL (MISCELLANEOUS) ×2
PACK ORTHO CERVICAL (CUSTOM PROCEDURE TRAY) ×2 IMPLANT
PAD ARMBOARD 7.5X6 YLW CONV (MISCELLANEOUS) ×4 IMPLANT
PATTIES SURGICAL .5 X.5 (GAUZE/BANDAGES/DRESSINGS) IMPLANT
PATTIES SURGICAL .5 X1 (DISPOSABLE) IMPLANT
PIN DISTRACTION 14 (PIN) ×2 IMPLANT
PLATE SKYLINE 3LVL 45MM CERV (Plate) ×1 IMPLANT
POSITIONER HEAD DONUT 9IN (MISCELLANEOUS) ×2 IMPLANT
SCREW SKYLINE VARIABLE LG (Screw) ×3 IMPLANT
SCREW VARIABLE SELF TAP 12MM (Screw) ×5 IMPLANT
SPONGE INTESTINAL PEANUT (DISPOSABLE) ×4 IMPLANT
SPONGE SURGIFOAM ABS GEL 100 (HEMOSTASIS) ×2 IMPLANT
STRIP CLOSURE SKIN 1/2X4 (GAUZE/BANDAGES/DRESSINGS) ×2 IMPLANT
SURGIFLO W/THROMBIN 8M KIT (HEMOSTASIS) IMPLANT
SUT MNCRL AB 4-0 PS2 18 (SUTURE) ×2 IMPLANT
SUT VIC AB 2-0 CT2 18 VCP726D (SUTURE) ×2 IMPLANT
SYR BULB IRRIG 60ML STRL (SYRINGE) ×2 IMPLANT
SYR CONTROL 10ML LL (SYRINGE) ×4 IMPLANT
TAPE CLOTH 4X10 WHT NS (GAUZE/BANDAGES/DRESSINGS) ×2 IMPLANT
TAPE PAPER 3X10 WHT MICROPORE (GAUZE/BANDAGES/DRESSINGS) ×1 IMPLANT
TAPE UMBILICAL COTTON 1/8X30 (MISCELLANEOUS) ×2 IMPLANT
TOWEL GREEN STERILE (TOWEL DISPOSABLE) ×2 IMPLANT
TOWEL GREEN STERILE FF (TOWEL DISPOSABLE) ×2 IMPLANT
TRAY FOLEY MTR SLVR 16FR STAT (SET/KITS/TRAYS/PACK) ×2 IMPLANT
WATER STERILE IRR 1000ML POUR (IV SOLUTION) ×2 IMPLANT
YANKAUER SUCT BULB TIP NO VENT (SUCTIONS) ×2 IMPLANT

## 2020-08-17 NOTE — H&P (Signed)
PREOPERATIVE H&P  Chief Complaint: Right shoulder and arm pain and weakness  HPI: Deborah Soto is a 55 y.o. female who presents with ongoing pain in the pain and weakness as noted above  MRI reveals spinal stenosis spanning C3-C6  Patient has failed multiple forms of conservative care and continues to have pain (see office notes for additional details regarding the patient's full course of treatment)  Past Medical History:  Diagnosis Date  . Anemia   . Anxiety   . Asthma   . Depression   . Diabetes mellitus type 2 in obese (HCC) 06/11/2016  . Dyspnea   . Embolism - blood clot January 1997 & January 2017   Also had LLE DVT in 1997  . History of kidney stones   . Hypertension   . Preeclampsia 10/10/2011   1999   . Sleep apnea    Past Surgical History:  Procedure Laterality Date  . KIDNEY STONE SURGERY     Social History   Socioeconomic History  . Marital status: Married    Spouse name: Not on file  . Number of children: Y  . Years of education: Not on file  . Highest education level: Not on file  Occupational History  . Occupation: admin assist    Comment: Engineer, manufacturing systems  Tobacco Use  . Smoking status: Former Smoker    Packs/day: 1.00    Years: 20.00    Pack years: 20.00    Quit date: 03/12/2004    Years since quitting: 16.4  . Smokeless tobacco: Never Used  Vaping Use  . Vaping Use: Never used  Substance and Sexual Activity  . Alcohol use: Yes    Alcohol/week: 1.0 standard drink    Types: 1 Cans of beer per week    Comment: 1-2 times a week  . Drug use: No  . Sexual activity: Yes    Partners: Male    Birth control/protection: None  Other Topics Concern  . Not on file  Social History Narrative   Originally from [place]. Always lived in [place]. 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 No international travel recently. Previously has been to [place] in May 1996. No mold exposure recently. No bird exposure. Does have multiple pets.    Social  Determinants of Health   Financial Resource Strain: Not on file  Food Insecurity: Not on file  Transportation Needs: Not on file  Physical Activity: Not on file  Stress: Not on file  Social Connections: Not on file   Family History  Problem Relation Age of Onset  . Dementia Father   . Heart disease Father   . Clotting disorder Mother   . Arthritis Mother   . Clotting disorder Sister   . Clotting disorder Maternal Grandmother   . Clotting disorder Maternal Aunt   . Rheumatologic disease Neg Hx   . Hyperparathyroidism Neg Hx    Allergies  Allergen Reactions  . Tramadol Other (See Comments)    Tingling all over   . Hydrocodone     Ineffective    Prior to Admission medications   Medication Sig Start Date End Date Taking? Authorizing Provider  amitriptyline (ELAVIL) 50 MG tablet Take 1 tablet (50 mg total) by mouth at bedtime. 08/10/20  Yes Cottle, Steva Ready., MD  ampicillin (PRINCIPEN) 500 MG capsule Take 500 mg by mouth daily.   Yes [provider]  celecoxib (CELEBREX) 200 MG capsule TAKE 1 CAPSULE TWICE DAILY FOR CHRONIC PAIN. Patient taking differently: Take  200 mg by mouth 2 (two) times daily. 02/29/20  Yes Ardith Dark, MD  Cholecalciferol (VITAMIN D) 50 MCG (2000 UT) tablet Take 2,000 Units by mouth daily.   Yes [provider]  cyanocobalamin (,VITAMIN B-12,) 1000 MCG/ML injection One Injection weekly for 4 weeks then one injection monthly for 3 month Patient taking differently: Inject 1,000 mcg into the skin every 30 (thirty) days. 07/20/20  Yes Ardith Dark, MD  Esketamine HCl, 84 MG Dose, (SPRAVATO, 84 MG DOSE,) 28 MG/DEVICE SOPK Place 84 mg into the nose See admin instructions. Use twice weekly for 4 weeks then reduce to once weekly   Yes [provider]  gabapentin (NEURONTIN) 300 MG capsule Take 2 capsules in the morning, 1 in the middle of the day, and 2 at night. Patient taking differently: Take 300-600 mg by mouth See admin  instructions. Take 600 mg in the morning, 300 mg in the middle of the day, and take 600 mg at night 05/09/20  Yes Ardith Dark, MD  JARDIANCE 25 MG TABS tablet TAKE ONE TABLET BY MOUTH DAILY Patient taking differently: Take 25 mg by mouth daily. 07/18/20  Yes Ardith Dark, MD  lithium carbonate (LITHOBID) 300 MG CR tablet Take 3 tablets (900 mg total) by mouth at bedtime. 04/22/20  Yes Cottle, Steva Ready., MD  Multiple Vitamin (MULTIVITAMIN WITH MINERALS) TABS tablet Take 1 tablet by mouth daily.   Yes [provider]  OLANZapine (ZYPREXA) 10 MG tablet TAKE ONE TABLET BY MOUTH AT BEDTIME Patient taking differently: Take 10 mg by mouth at bedtime. 07/27/20  Yes Cottle, Steva Ready., MD  pantoprazole (PROTONIX) 40 MG tablet TAKE 1 TABLET ONCE DAILY. Patient taking differently: Take 40 mg by mouth at bedtime. 07/21/18  Yes Ardith Dark, MD  propranolol (INDERAL) 40 MG tablet take ONE TO TWO tablets every EIGHT hours  FOR anxiety Patient taking differently: Take 40 mg by mouth 2 (two) times daily. 06/09/20  Yes Cottle, Steva Ready., MD  rivaroxaban (XARELTO) 20 MG TABS tablet Take 1 tablet (20 mg total) by mouth daily with supper. 07/07/20  Yes Ardith Dark, MD  Semaglutide, 1 MG/DOSE, (OZEMPIC, 1 MG/DOSE,) 4 MG/3ML SOPN Inject 2 mg into the skin once a week. Patient taking differently: Inject 2 mg into the skin every Thursday. 06/23/20  Yes Ardith Dark, MD  TRESIBA FLEXTOUCH 100 UNIT/ML FlexTouch Pen Inject 80 Units into the skin at bedtime. 08/15/20 11/13/20 Yes Ardith Dark, MD  tretinoin (RETIN-A) 0.1 % cream Apply 1 application topically at bedtime. 07/12/20  Yes [provider]  Vilazodone HCl (VIIBRYD) 40 MG TABS Take 1.5 tablets (60 mg total) by mouth daily. 07/27/20  Yes Cottle, Steva Ready., MD  Empagliflozin-metFORMIN HCl ER (SYNJARDY XR) 25-1000 MG TB24 Take 1 tablet by mouth daily. 08/12/20   Ardith Dark, MD     All other systems have been reviewed and were otherwise  negative with the exception of those mentioned in the HPI and as above.  Physical Exam: Vitals:   08/17/20 1021  BP: (!) 144/96  Pulse: 97  Resp: 18  Temp: 98.7 F (37.1 C)  SpO2: 96%    Body mass index is 38.01 kg/m.  General: Alert, no acute distress Cardiovascular: No pedal edema Respiratory: No cyanosis, no use of accessory musculature Skin: No lesions in the area of chief complaint Neurologic: Sensation intact distally Psychiatric: Patient is competent for consent with normal mood and affect  Lymphatic: No axillary or cervical lymphadenopathy   Assessment/Plan: RIGHT-SIDED CERVICAL RADICULOPATHY Plan for Procedure(s): ANTERIOR CERVICAL DECOMPRESSION FUSION CERVICAL 3- CERVICAL 4, CERVICAL 4- CERVICAL 5, CERVICAL 5- CERVICAL 6 WITH INSTRUMENTATION AND ALLOGRAFT   Jackelyn Hoehn, MD 08/17/2020 12:19 PM

## 2020-08-17 NOTE — Progress Notes (Signed)
Patient continue to c/o pain despite PRN po med. Also c/o of new numbness on LUA and rt leg. On call PA notified. New order for Dilaudid IV received. Will monitor patient closely.

## 2020-08-17 NOTE — Op Note (Signed)
PATIENT NAME: Deborah Soto   MEDICAL RECORD NO.:   [id number]    DATE OF BIRTH: [birth year]   DATE OF PROCEDURE: 08/17/2020                               OPERATIVE REPORT     PREOPERATIVE DIAGNOSES: 1. Right-sided cervical radiculopathy. 2. Spinal stenosis spanning C3-C6   POSTOPERATIVE DIAGNOSES: 1. Right-sided cervical radiculopathy. 2. Spinal stenosis spanning C3-C6   PROCEDURE: 1. Anterior cervical decompression and fusion C3/4, C4/5, C5/6 2. Placement of anterior instrumentation, C3-C6 3. Insertion of interbody device x 3 (Titan intervertebral spacers). 4. Intraoperative use of fluoroscopy. 5. Use of morselized allograft - ViviGen.   SURGEON:  Estill Bamberg, MD   ASSISTANT:  Jason Coop, PA-C.   ANESTHESIA:  General endotracheal anesthesia.   COMPLICATIONS:  None.   DISPOSITION:  Stable.   ESTIMATED BLOOD LOSS:  Minimal.   INDICATIONS FOR SURGERY:  Briefly, Deborah Soto is a pleasant 55 y.o. year- old female, who did present to me with ongoing pain in the neck and right arm.  The patient's MRI did reveal the findings noted above.  Given the patient's ongoing rather debilitating pain and lack of improvement with appropriate treatment measures, we did discuss proceeding with the procedure noted above.  The patient was fully aware of the risks and limitations of surgery as outlined in my preoperative note.   OPERATIVE DETAILS:  On 08/17/2020, the patient was brought to surgery and general endotracheal anesthesia was administered.  The patient was placed supine on the hospital bed. The neck was gently extended.  All bony prominences were meticulously padded.  The neck was prepped and draped in the usual sterile fashion.  At this point, I did make a left-sided transverse incision.  The platysma was incised.  A Smith-Robinson approach was used and the anterior spine was identified. A self-retaining retractor was placed.  I then subperiosteally exposed the vertebral  bodies from C3-C6.  Caspar pins were then placed into the C5 and C6 vertebral bodies and distraction was applied.  A thorough and complete C6-7 intervertebral diskectomy was performed.  The posterior longitudinal ligament was identified and entered using a nerve hook.  I then used #1 followed by #2 Kerrison to perform a thorough and complete intervertebral diskectomy.  The spinal canal was thoroughly decompressed, as was the right neuroforamen.  The endplates were then prepared and the appropriate-sized intervertebral spacer was then packed with ViviGen and tamped into position in the usual fashion.  The lower Caspar pin was then removed and placed into the C4 vertebral body and once again, distraction was applied across the C4/5 intervertebral space.  I then again performed a thorough and complete diskectomy, thoroughly decompressing the spinal canal and right neuroforamen.  After preparing the endplates, the appropriate-sized intervertebral spacer was packed with ViviGen and tamped into position.  The lower Caspar pin was then removed and placed into the C3 vertebral body and once again, distraction was applied across the C3/4 intervertebral space.  I then again performed a thorough and complete diskectomy, thoroughly decompressing the spinal canal and bilateral neuroforamena.  After preparing the endplates, the appropriate-sized intervertebral spacer was packed with ViviGen and tamped into position.  The Caspar pins then were removed and bone wax was placed in their place.  The appropriate-sized anterior cervical plate was placed over the anterior spine.  12 mm variable angle screws were placed, 2 in each vertebral body  from C3-C6 for a total of 8 vertebral body screws.  The screws were then locked to the plate using the Cam locking mechanism.  I was very pleased with the final fluoroscopic images.  The wound was then irrigated.  The wound was then explored for any undue bleeding and  there was no bleeding noted. The wound was then closed in layers using 2-0 Vicryl, followed by 4-0 Monocryl.  Benzoin and Steri-Strips were applied, followed by sterile dressing.  All instrument counts were correct at the termination of the procedure.   Of note, Jason Coop, PA-C, was my assistant throughout surgery, and did aid in retraction, suctioning, placement of the hardware, and closure from start to finish.         Estill Bamberg, MD

## 2020-08-17 NOTE — Transfer of Care (Signed)
Immediate Anesthesia Transfer of Care Note  Patient: Evangelynn Lochridge  Procedure(s) Performed: ANTERIOR CERVICAL DECOMPRESSION FUSION CERVICAL THREE - CERVICAL FOUR, CERVICAL FOUR- CERVICAL FIVE, CERVICAL FIVE- CERVICAL SIX WITH INSTRUMENTATION AND ALLOGRAFT (N/A )  Patient Location: PACU  Anesthesia Type:General  Level of Consciousness: drowsy  Airway & Oxygen Therapy: Patient Spontanous Breathing and Patient connected to face mask oxygen  Post-op Assessment: Report given to RN and Post -op Vital signs reviewed and stable  Post vital signs: Reviewed and stable  Last Vitals:  Vitals Value Taken Time  BP 146/97 08/17/20 1611  Temp 37.2 C 08/17/20 1611  Pulse 103 08/17/20 1612  Resp 22 08/17/20 1612  SpO2 94 % 08/17/20 1612  Vitals shown include unvalidated device data.  Last Pain:  Vitals:   08/17/20 1611  TempSrc:   PainSc: Asleep         Complications: No complications documented.

## 2020-08-17 NOTE — Anesthesia Procedure Notes (Signed)
Procedure Name: Intubation Date/Time: 08/17/2020 1:06 PM Performed by: Jodell Cipro, CRNA Pre-anesthesia Checklist: Patient identified, Emergency Drugs available, Suction available and Patient being monitored Patient Re-evaluated:Patient Re-evaluated prior to induction Oxygen Delivery Method: Circle system utilized Preoxygenation: Pre-oxygenation with 100% oxygen Induction Type: IV induction Ventilation: Mask ventilation without difficulty Laryngoscope Size: Glidescope and 4 Grade View: Grade I Tube type: Oral Tube size: 7.0 mm Number of attempts: 1 Airway Equipment and Method: Stylet and Oral airway Placement Confirmation: ETT inserted through vocal cords under direct vision,  positive ETCO2 and breath sounds checked- equal and bilateral Secured at: 22 cm Tube secured with: Tape Dental Injury: Teeth and Oropharynx as per pre-operative assessment

## 2020-08-18 DIAGNOSIS — M4802 Spinal stenosis, cervical region: Secondary | ICD-10-CM | POA: Diagnosis not present

## 2020-08-18 DIAGNOSIS — M5412 Radiculopathy, cervical region: Secondary | ICD-10-CM | POA: Diagnosis not present

## 2020-08-18 DIAGNOSIS — J45909 Unspecified asthma, uncomplicated: Secondary | ICD-10-CM | POA: Diagnosis not present

## 2020-08-18 DIAGNOSIS — I10 Essential (primary) hypertension: Secondary | ICD-10-CM | POA: Diagnosis not present

## 2020-08-18 DIAGNOSIS — Z79899 Other long term (current) drug therapy: Secondary | ICD-10-CM | POA: Diagnosis not present

## 2020-08-18 DIAGNOSIS — E119 Type 2 diabetes mellitus without complications: Secondary | ICD-10-CM | POA: Diagnosis not present

## 2020-08-18 DIAGNOSIS — Z7901 Long term (current) use of anticoagulants: Secondary | ICD-10-CM | POA: Diagnosis not present

## 2020-08-18 DIAGNOSIS — Z7984 Long term (current) use of oral hypoglycemic drugs: Secondary | ICD-10-CM | POA: Diagnosis not present

## 2020-08-18 DIAGNOSIS — Z87891 Personal history of nicotine dependence: Secondary | ICD-10-CM | POA: Diagnosis not present

## 2020-08-18 LAB — GLUCOSE, CAPILLARY: Glucose-Capillary: 235 mg/dL — ABNORMAL HIGH (ref 70–99)

## 2020-08-18 MED ORDER — METHOCARBAMOL 500 MG PO TABS: 500.0000 mg | ORAL_TABLET | Freq: Four times a day (QID) | ORAL | 2 refills | Status: DC | PRN

## 2020-08-18 MED ORDER — OXYCODONE-ACETAMINOPHEN 5-325 MG PO TABS: 1.0000 | ORAL_TABLET | ORAL | 0 refills | Status: AC | PRN

## 2020-08-18 MED ORDER — HYDROMORPHONE HCL 1 MG/ML IJ SOLN
0.5000 mg | Freq: Once | INTRAMUSCULAR | Status: AC
  Administered 2020-08-18: 0.5 mg via INTRAVENOUS
  Filled 2020-08-18: qty 0.5

## 2020-08-18 NOTE — Anesthesia Postprocedure Evaluation (Signed)
Anesthesia Post Note  Patient: Deborah Soto  Procedure(s) Performed: ANTERIOR CERVICAL DECOMPRESSION FUSION CERVICAL THREE - CERVICAL FOUR, CERVICAL FOUR- CERVICAL FIVE, CERVICAL FIVE- CERVICAL SIX WITH INSTRUMENTATION AND ALLOGRAFT     Patient location during evaluation: PACU Anesthesia Type: General Level of consciousness: awake and alert Pain management: pain level controlled Vital Signs Assessment: post-procedure vital signs reviewed and stable Respiratory status: spontaneous breathing, nonlabored ventilation, respiratory function stable and patient connected to nasal cannula oxygen Cardiovascular status: blood pressure returned to baseline and stable Postop Assessment: no apparent nausea or vomiting Anesthetic complications: no   No notable events documented.                Shelton Silvas

## 2020-08-18 NOTE — Progress Notes (Signed)
    Patient doing well  Reports improvement in preop right arm and shoulder pain Tolerating PO well + pain in region of shoulderblades   Physical Exam: Vitals:   08/17/20 2333 08/18/20 0414  BP:  (!) 157/89  Pulse: (!) 104 (!) 102  Resp:  18  Temp:  99.1 F (37.3 C)  SpO2:  91%    Neck soft/supple Dressing in place NVI  POD #1 s/p ACDF, doing well  - encourage ambulation - Percocet for pain, Robaxin for muscle spasms - d/c home today with f/u in 2 weeks

## 2020-08-18 NOTE — Progress Notes (Signed)
Patient is discharged from room 3C08 at this time. Alert and in stable condition. IV site d/c'd and instructions read to patient and spouse with understanding verbalized and all questions answered. Left unit via wheelchair with all belongings at side. 

## 2020-08-18 NOTE — Progress Notes (Signed)
RT set up patient's home CPAP at bedside. Patient stated she could place herself on CPAP when ready.

## 2020-08-19 ENCOUNTER — Encounter (HOSPITAL_COMMUNITY): Payer: Self-pay | Admitting: Orthopedic Surgery

## 2020-08-23 DIAGNOSIS — F332 Major depressive disorder, recurrent severe without psychotic features: Secondary | ICD-10-CM | POA: Diagnosis not present

## 2020-08-25 NOTE — Discharge Summary (Signed)
Patient ID: Amalia Edgecombe MRN: [id number] DOB/AGE: [birth year] 53 y.o.  Admit date: 08/17/2020 Discharge date: 08/18/2020  Admission Diagnoses:  Active Problems:   Radiculopathy   Discharge Diagnoses:  Same  Past Medical History:  Diagnosis Date   Anemia    Anxiety    Asthma    Depression    Diabetes mellitus type 2 in obese (HCC) 06/11/2016   Dyspnea    Embolism - blood clot January 1997 & January 2017   Also had LLE DVT in 1997   History of kidney stones    Hypertension    Preeclampsia 10/10/2011   1999    Sleep apnea     Surgeries: Procedure(s): ANTERIOR CERVICAL DECOMPRESSION FUSION CERVICAL THREE - CERVICAL FOUR, CERVICAL FOUR- CERVICAL FIVE, CERVICAL FIVE- CERVICAL SIX WITH INSTRUMENTATION AND ALLOGRAFT on 08/17/2020   Consultants: none  Discharged Condition: Improved  Hospital Course: Sherilee Smotherman is an 55 y.o. female who was admitted 08/17/2020 for operative treatment of radiculopathy. Patient has severe unremitting pain that affects sleep, daily activities, and work/hobbies. After pre-op clearance the patient was taken to the operating room on 08/17/2020 and underwent  Procedure(s): ANTERIOR CERVICAL DECOMPRESSION FUSION CERVICAL THREE - CERVICAL FOUR, CERVICAL FOUR- CERVICAL FIVE, CERVICAL FIVE- CERVICAL SIX WITH INSTRUMENTATION AND ALLOGRAFT.    Patient was given perioperative antibiotics:  Anti-infectives (From admission, onward)    Start     Dose/Rate Route Frequency Ordered Stop   08/19/20 1000  ampicillin (PRINCIPEN) capsule 500 mg  Status:  Discontinued        500 mg Oral Daily 08/17/20 1842 08/18/20 1645   08/17/20 2100  ceFAZolin (ANCEF) IVPB 2g/100 mL premix        2 g 200 mL/hr over 30 Minutes Intravenous Every 8 hours 08/17/20 1842 08/18/20 0505   08/17/20 1030  ceFAZolin (ANCEF) IVPB 2g/100 mL premix        2 g 200 mL/hr over 30 Minutes Intravenous On call to O.R. 08/17/20 1018 08/17/20 1324        Patient was given sequential  compression devices, early ambulation to prevent DVT.  Patient benefited maximally from hospital stay and there were no complications.    Recent vital signs: BP (!) 105/43 (BP Location: Right Arm)   Pulse (!) 103   Temp 98.1 F (36.7 C) (Oral)   Resp 18   Ht 5\' 8"  (1.727 m)   Wt 113.4 kg   LMP  (LMP Unknown) Comment: last period ---  1 year ago (?)  SpO2 92%   BMI 38.01 kg/m    Discharge Medications:   Allergies as of 08/18/2020       Reactions   Tramadol Other (See Comments)   Tingling all over    Hydrocodone    Ineffective         Medication List     TAKE these medications    amitriptyline 50 MG tablet Commonly known as: ELAVIL Take 1 tablet (50 mg total) by mouth at bedtime.   ampicillin 500 MG capsule Commonly known as: PRINCIPEN Take 500 mg by mouth daily.   cyanocobalamin 1000 MCG/ML injection Commonly known as: (VITAMIN B-12) One Injection weekly for 4 weeks then one injection monthly for 3 month What changed:  how much to take how to take this when to take this additional instructions   gabapentin 300 MG capsule Commonly known as: NEURONTIN Take 2 capsules in the morning, 1 in the middle of the day, and 2 at night. What changed:  how  much to take how to take this when to take this additional instructions   Jardiance 25 MG Tabs tablet Generic drug: empagliflozin TAKE ONE TABLET BY MOUTH DAILY What changed: how much to take   lithium carbonate 300 MG CR tablet Commonly known as: LITHOBID Take 3 tablets (900 mg total) by mouth at bedtime.   methocarbamol 500 MG tablet Commonly known as: ROBAXIN Take 1 tablet (500 mg total) by mouth every 6 (six) hours as needed for muscle spasms.   multivitamin with minerals Tabs tablet Take 1 tablet by mouth daily.   OLANZapine 10 MG tablet Commonly known as: ZYPREXA TAKE ONE TABLET BY MOUTH AT BEDTIME   oxyCODONE-acetaminophen 5-325 MG tablet Commonly known as: Percocet Take 1 tablet by mouth every  4 (four) hours as needed for up to 7 days for severe pain.   Ozempic (1 MG/DOSE) 4 MG/3ML Sopn Generic drug: Semaglutide (1 MG/DOSE) Inject 2 mg into the skin once a week. What changed: when to take this   pantoprazole 40 MG tablet Commonly known as: PROTONIX TAKE 1 TABLET ONCE DAILY. What changed: when to take this   propranolol 40 MG tablet Commonly known as: INDERAL take ONE TO TWO tablets every EIGHT hours  FOR anxiety What changed:  how much to take how to take this when to take this additional instructions   rivaroxaban 20 MG Tabs tablet Commonly known as: Xarelto Take 1 tablet (20 mg total) by mouth daily with supper.   Spravato (84 MG Dose) 28 MG/DEVICE Sopk Generic drug: Esketamine HCl (84 MG Dose) Place 84 mg into the nose See admin instructions. Use twice weekly for 4 weeks then reduce to once weekly   Synjardy XR 25-1000 MG Tb24 Generic drug: Empagliflozin-metFORMIN HCl ER Take 1 tablet by mouth daily.   Evaristo Bury FlexTouch 100 UNIT/ML FlexTouch Pen Generic drug: insulin degludec Inject 80 Units into the skin at bedtime.   tretinoin 0.1 % cream Commonly known as: RETIN-A Apply 1 application topically at bedtime.   Viibryd 40 MG Tabs Generic drug: Vilazodone HCl Take 1.5 tablets (60 mg total) by mouth daily.   Vitamin D 50 MCG (2000 UT) tablet Take 2,000 Units by mouth daily.        Diagnostic Studies: DG Cervical Spine 1 View  Result Date: 08/17/2020 CLINICAL DATA:  ACDF C3-C4, C4-C5, C5-C6 with instrumentation and allograft. EXAM: DG CERVICAL SPINE - 1 VIEW; DG C-ARM 1-60 MIN COMPARISON:  None. FINDINGS: Two lateral fluoroscopic spot views of the cervical spine obtained. Anterior fusion hardware extends from C3 through C6 with interbody spacers. Lower most aspect of the hardware is not well visualized due to overlapping structures. Total fluoroscopy time 16 seconds. Total dose 8.59 mGy. IMPRESSION: Intraoperative fluoroscopy during cervical spine  fusion. Electronically Signed   By: Narda Rutherford M.D.   On: 08/17/2020 16:07   MR Brain Wo Contrast  Result Date: 07/28/2020 CLINICAL DATA:  Motor neuron disease.  Weakness in arms and legs. EXAM: MRI HEAD WITHOUT CONTRAST TECHNIQUE: Multiplanar, multiecho pulse sequences of the brain and surrounding structures were obtained without intravenous contrast. COMPARISON:  MRI head 03/25/2017. FINDINGS: Brain: No acute infarction, hemorrhage, hydrocephalus, extra-axial collection or mass lesion. Vascular: Major arterial flow voids are maintained at the skull base. Similar small left vertebral artery. Skull and upper cervical spine: Normal marrow signal. Sinuses/Orbits: Clear sinuses.  Unremarkable orbits. Other: Trace right mastoid fluid. IMPRESSION: Normal appearance of the brain.  No evidence of acute abnormality. Electronically Signed   By: Gelene Mink  Barnett Applebaum MD   On: 07/28/2020 08:59   MR Thoracic Spine Wo Contrast  Result Date: 07/28/2020 CLINICAL DATA:  Motor neuron disease.  Weakness in arms and legs. EXAM: MRI THORACIC SPINE WITHOUT CONTRAST TECHNIQUE: Multiplanar, multisequence MR imaging of the thoracic spine was performed. No intravenous contrast was administered. COMPARISON:  None. FINDINGS: Alignment:  Normal. Vertebrae: Vertebral body heights are maintained. No specific evidence of acute fracture, discitis/osteomyelitis, or suspicious bone lesion. Cord:  Normal cord signal. Paraspinal and other soft tissues: Unremarkable. Disc levels: There are posterior disc bulges at T3-T4, T5-T6, T6-T7, T7-T8 and T8-T9. The largest disc protrusion is at T6-T7, where it contacts and flattens the cord. No significant canal stenosis. Multilevel facet hypertrophy with mild to moderate bilateral foraminal stenosis at T8-T9 and mild bilateral foraminal stenosis at T9-T10. IMPRESSION: 1. Multilevel disc protrusions, largest at T6-T7 with disc contacting and flattening the ventral cord. No significant canal stenosis.  2. Mild-to-moderate foraminal stenosis at T8-T9 and mild bilateral foraminal stenosis at T9-T10. 3. Normal cord signal. Electronically Signed   By: Feliberto Harts MD   On: 07/28/2020 09:10   DG C-Arm 1-60 Min  Result Date: 08/17/2020 CLINICAL DATA:  ACDF C3-C4, C4-C5, C5-C6 with instrumentation and allograft. EXAM: DG CERVICAL SPINE - 1 VIEW; DG C-ARM 1-60 MIN COMPARISON:  None. FINDINGS: Two lateral fluoroscopic spot views of the cervical spine obtained. Anterior fusion hardware extends from C3 through C6 with interbody spacers. Lower most aspect of the hardware is not well visualized due to overlapping structures. Total fluoroscopy time 16 seconds. Total dose 8.59 mGy. IMPRESSION: Intraoperative fluoroscopy during cervical spine fusion. Electronically Signed   By: Narda Rutherford M.D.   On: 08/17/2020 16:07    Disposition: Discharge disposition: 01-Home or Self Care       Discharge Instructions     Discharge patient   Complete by: As directed    Discharge disposition: 01-Home or Self Care   Discharge patient date: 08/18/2020      POD #1 s/p ACDF, doing well   - encourage ambulation - Percocet for pain, Robaxin for muscle spasms -Scripts for pain sent to pharmacy electronically  -D/C instructions sheet printed and in chart -D/C today  -F/U in office 2 weeks   Signed: Eilene Ghazi Ricco Dershem 08/25/2020, 11:14 AM

## 2020-08-26 DIAGNOSIS — Z981 Arthrodesis status: Secondary | ICD-10-CM | POA: Diagnosis not present

## 2020-08-26 DIAGNOSIS — M4802 Spinal stenosis, cervical region: Secondary | ICD-10-CM | POA: Diagnosis not present

## 2020-08-30 DIAGNOSIS — F332 Major depressive disorder, recurrent severe without psychotic features: Secondary | ICD-10-CM | POA: Diagnosis not present

## 2020-08-31 DIAGNOSIS — Z9889 Other specified postprocedural states: Secondary | ICD-10-CM | POA: Diagnosis not present

## 2020-08-31 DIAGNOSIS — M5412 Radiculopathy, cervical region: Secondary | ICD-10-CM | POA: Diagnosis not present

## 2020-09-02 ENCOUNTER — Other Ambulatory Visit: Payer: Self-pay | Admitting: Psychiatry

## 2020-09-05 ENCOUNTER — Telehealth: Payer: Self-pay

## 2020-09-05 NOTE — Telephone Encounter (Signed)
Pt says that she has the [place]. It is hard for her to swallow right now, since having surgery.  She really wants to stick to the Saxenda because she was losing weight, and her numbers where at goal. She took the last dose of Ozempic last Thursday, and has gone 2 weeks without her injection. Pt is concerned about being on something.  I will check on the PA first thing in the morning. Pt is aware.

## 2020-09-05 NOTE — Telephone Encounter (Signed)
error 

## 2020-09-06 ENCOUNTER — Telehealth: Payer: Self-pay | Admitting: *Deleted

## 2020-09-06 DIAGNOSIS — F332 Major depressive disorder, recurrent severe without psychotic features: Secondary | ICD-10-CM | POA: Diagnosis not present

## 2020-09-06 NOTE — Telephone Encounter (Signed)
PA Rx Ozempic send to day  Waiting for determination   PA Effective from 09/06/2020 through 09/05/2021. Pharmacy notified

## 2020-09-07 ENCOUNTER — Other Ambulatory Visit: Payer: Self-pay | Admitting: Psychiatry

## 2020-09-07 DIAGNOSIS — F332 Major depressive disorder, recurrent severe without psychotic features: Secondary | ICD-10-CM

## 2020-09-07 DIAGNOSIS — F411 Generalized anxiety disorder: Secondary | ICD-10-CM

## 2020-09-07 NOTE — Telephone Encounter (Signed)
Last filled 6/24 appt on 8/16

## 2020-09-08 DIAGNOSIS — F332 Major depressive disorder, recurrent severe without psychotic features: Secondary | ICD-10-CM | POA: Diagnosis not present

## 2020-09-13 ENCOUNTER — Telehealth: Payer: Self-pay

## 2020-09-13 DIAGNOSIS — S39012A Strain of muscle, fascia and tendon of lower back, initial encounter: Secondary | ICD-10-CM | POA: Diagnosis not present

## 2020-09-13 NOTE — Telephone Encounter (Signed)
Pt seen today at [facility] for problem.

## 2020-09-13 NOTE — Telephone Encounter (Signed)
Nurse Assessment Nurse: Izora Ribas, RN, Melanie Date/Time (Eastern Time): 09/13/2020 9:02:49 AM Confirm and document reason for call. If symptomatic, describe symptoms. ---Pt who has pain on one side/flank/shoulder for 2 weeks. Sx on neck a month ago Has hx of kidney stones but pain is slightly higher on flank now. Right sided weakness noticed. Does the patient have any new or worsening symptoms? ---Yes Will a triage be completed? ---Yes Related visit to physician within the last 2 weeks? ---Yes Does the PT have any chronic conditions? (i.e. diabetes, asthma, this includes High risk factors for pregnancy, etc.) ---Yes List chronic conditions. ---Neck sx, kidney stones, depression, DM, FM Is the patient pregnant or possibly pregnant? (Ask all females between the ages of 8-55) ---No Is this a behavioral health or substance abuse call? ---No Guidelines Guideline Title Affirmed Question Affirmed Notes Nurse Date/Time (Eastern Time) Flank Pain Weakness of a leg or foot (e.g., unable to bear weight, dragging foot) Izora Ribas, RN, Shawna Orleans 09/13/2020 9:05:37 AM PLEASE NOTE: All timestamps contained within this report are represented as [place] Standard Time. CONFIDENTIALTY NOTICE: This fax transmission is intended only for the addressee. It contains information that is legally privileged, confidential or otherwise protected from use or disclosure. If you are not the intended recipient, you are strictly prohibited from reviewing, disclosing, copying using or disseminating any of this information or taking any action in reliance on or regarding this information. If you have received this fax in error, please notify us immediately by telephone so that we can arrange for its return to [name]. Phone: [phone], Toll-Free: [phone], Fax: [phone] Page: 2 of 2 Call Id: [id number] Disp. Time Lamount Cohen Time) Disposition Final User 09/13/2020 9:08:15 AM Go to ED Now (or PCP triage) Yes Izora Ribas, RN,  Mittie Bodo Disagree/Comply Comply Caller Understands Yes PreDisposition Call Doctor Care Advice Given Per Guideline GO TO ED NOW (OR PCP TRIAGE): * IF NO PCP (PRIMARY CARE PROVIDER) SECOND-LEVEL TRIAGE: You need to be seen within the next hour. Go to the ED/UCC at _____________ Hospital. Leave as soon as you can. BRING MEDICINES: CARE ADVICE given per Flank Pain (Adult) guideline. Comments User: Patria Mane, RN Date/Time Lamount Cohen Time): 09/13/2020 9:06:40 AM Saw neck sx a week ago and discussed weakness but not the pain. User: Patria Mane, RN Date/Time Lamount Cohen Time): 09/13/2020 9:07:13 AM 2-3/10 sitting 6-7/10 with movement Referrals GO TO FACILITY UNDECIDED

## 2020-09-14 DIAGNOSIS — F332 Major depressive disorder, recurrent severe without psychotic features: Secondary | ICD-10-CM | POA: Diagnosis not present

## 2020-09-16 DIAGNOSIS — F332 Major depressive disorder, recurrent severe without psychotic features: Secondary | ICD-10-CM | POA: Diagnosis not present

## 2020-09-19 DIAGNOSIS — F332 Major depressive disorder, recurrent severe without psychotic features: Secondary | ICD-10-CM | POA: Diagnosis not present

## 2020-09-20 DIAGNOSIS — G5621 Lesion of ulnar nerve, right upper limb: Secondary | ICD-10-CM | POA: Diagnosis not present

## 2020-09-22 ENCOUNTER — Other Ambulatory Visit: Payer: Self-pay

## 2020-09-22 ENCOUNTER — Ambulatory Visit (INDEPENDENT_AMBULATORY_CARE_PROVIDER_SITE_OTHER): Payer: BC Managed Care – PPO | Admitting: Family Medicine

## 2020-09-22 VITALS — BP 109/79 | HR 103 | Temp 98.7°F | Ht 68.0 in | Wt 249.2 lb

## 2020-09-22 DIAGNOSIS — F332 Major depressive disorder, recurrent severe without psychotic features: Secondary | ICD-10-CM | POA: Diagnosis not present

## 2020-09-22 DIAGNOSIS — G629 Polyneuropathy, unspecified: Secondary | ICD-10-CM | POA: Diagnosis not present

## 2020-09-22 DIAGNOSIS — M5010 Cervical disc disorder with radiculopathy, unspecified cervical region: Secondary | ICD-10-CM

## 2020-09-22 DIAGNOSIS — E1169 Type 2 diabetes mellitus with other specified complication: Secondary | ICD-10-CM

## 2020-09-22 DIAGNOSIS — R52 Pain, unspecified: Secondary | ICD-10-CM | POA: Diagnosis not present

## 2020-09-22 DIAGNOSIS — E669 Obesity, unspecified: Secondary | ICD-10-CM | POA: Diagnosis not present

## 2020-09-22 LAB — CBC
HCT: 42 % (ref 36.0–46.0)
Hemoglobin: 14 g/dL (ref 12.0–15.0)
MCHC: 33.3 g/dL (ref 30.0–36.0)
MCV: 87.9 fl (ref 78.0–100.0)
Platelets: 297 10*3/uL (ref 150.0–400.0)
RBC: 4.78 Mil/uL (ref 3.87–5.11)
RDW: 15.2 % (ref 11.5–15.5)
WBC: 9 10*3/uL (ref 4.0–10.5)

## 2020-09-22 LAB — TSH: TSH: 0.96 u[IU]/mL (ref 0.35–5.50)

## 2020-09-22 LAB — HEMOGLOBIN A1C: Hgb A1c MFr Bld: 8.3 % — ABNORMAL HIGH (ref 4.6–6.5)

## 2020-09-22 LAB — COMPREHENSIVE METABOLIC PANEL
ALT: 46 U/L — ABNORMAL HIGH (ref 0–35)
AST: 34 U/L (ref 0–37)
Albumin: 4.6 g/dL (ref 3.5–5.2)
Alkaline Phosphatase: 84 U/L (ref 39–117)
BUN: 11 mg/dL (ref 6–23)
CO2: 22 mEq/L (ref 19–32)
Calcium: 9.9 mg/dL (ref 8.4–10.5)
Chloride: 104 mEq/L (ref 96–112)
Creatinine, Ser: 0.61 mg/dL (ref 0.40–1.20)
GFR: 102.49 mL/min (ref >60.00)
Glucose, Bld: 215 mg/dL — ABNORMAL HIGH (ref 70–99)
Potassium: 4 mEq/L (ref 3.5–5.1)
Sodium: 141 mEq/L (ref 135–145)
Total Bilirubin: 0.4 mg/dL (ref 0.2–1.2)
Total Protein: 7.1 g/dL (ref 6.0–8.3)

## 2020-09-22 LAB — SEDIMENTATION RATE: Sed Rate: 39 mm/hr — ABNORMAL HIGH (ref 0–30)

## 2020-09-22 LAB — C-REACTIVE PROTEIN: CRP: 1.7 mg/dL (ref 0.5–20.0)

## 2020-09-22 MED ORDER — TIRZEPATIDE 15 MG/0.5ML ~~LOC~~ SOAJ: 15.0000 mg | SUBCUTANEOUS | 1 refills | Status: DC

## 2020-09-22 MED ORDER — TRAMADOL HCL 50 MG PO TABS: 50.0000 mg | ORAL_TABLET | Freq: Three times a day (TID) | ORAL | 0 refills | Status: AC | PRN

## 2020-09-22 NOTE — Patient Instructions (Signed)
It was very nice to see you today!  We will check blood work today.  I will refer you to see physical therapy.  We can try switching your Ozempic to mounjaro.   I will see back in 3 months.  Please see back to see me sooner if needed.  Take care, Dr [name]  PLEASE NOTE:  If you had any lab tests please let us know if you have not heard back within a few days. You may see your results on mychart before we have a chance to review them but we will give you a call once they are reviewed by [name]. If we ordered any referrals today, please let us know if you have not heard from their office within the next week.   Please try these tips to maintain a healthy lifestyle:  Eat at least 3 REAL meals and 1-2 snacks per day.  Aim for no more than 5 hours between eating.  If you eat breakfast, please do so within one hour of getting up.   Each meal should contain half fruits/vegetables, one quarter protein, and one quarter carbs (no bigger than a computer mouse)  Cut down on sweet beverages. This includes juice, soda, and sweet tea.   Drink at least 1 glass of water with each meal and aim for at least 8 glasses per day  Exercise at least 150 minutes every week.

## 2020-09-22 NOTE — Assessment & Plan Note (Addendum)
She is on Ozempic 2 mg weekly.  We have been trying to get her Saxenda approved however insurance would not approve it.  We discussed options going forward.  We will try switching from Ozempic to [facility].  If insurance does not pay for this or if were not able to do this would consider adding on Victoza instead of Saxenda to her current dose of Ozempic.

## 2020-09-22 NOTE — Assessment & Plan Note (Signed)
Follows with psychiatry.  Recently started ketamine nasal spray.  She is not sure if it is helping.

## 2020-09-22 NOTE — Assessment & Plan Note (Signed)
On gabapentin 600 mg twice daily.  Discussed potentially increasing the dose however patient would like to defer this for now.  Tramadol should help some with this.  Hopefully will be able to get her back on anti-inflammatory as she heals from her surgery which should help as well.

## 2020-09-22 NOTE — Assessment & Plan Note (Signed)
She is currently on Synjardy 25-1000 once daily, Ozempic 2 mg weekly, and Tresiba 80 units daily.  Sugars are still in the upper 100s and low 200s.  We will check A1c today.  We will try switching her Ozempic over to [place] if insurance allows.  May consider adding on Victoza if insurance will not pay for either Saxenda or mounjaro.

## 2020-09-22 NOTE — Progress Notes (Signed)
Deborah Soto is a 55 y.o. female who presents today for an office visit.  Assessment/Plan:  Chronic Problems Addressed Today: Cervical disc disorder with radiculopathy of cervical region Status post surgery.  She is not sure if it is helped much with her right shoulder blade or other radicular symptoms at this point.  She will be following up with orthopedics again soon.  She is in a fair amount of pain and has had limited pain options.  She has been on tramadol in the past and had some tingling with this but she is willing to try again.  She does not want to do opioids.  Cannot take NSAIDs per direction of her orthopedist.  Her orthopedist had given her Valium and Robaxin both of which seem to be helping.  We will continue with these.  Neuropathy On gabapentin 600 mg twice daily.  Discussed potentially increasing the dose however patient would like to defer this for now.  Tramadol should help some with this.  Hopefully will be able to get her back on anti-inflammatory as she heals from her surgery which should help as well.  Diffuse pain Multifactorial.  She has a fair amount of deconditioning as well.  She has been working with physical therapy for her recent ulnar nerve and cervical disc disorder.  She is interested in seeing a physical therapist for her low back and leg issues as well.  We will place referral today.  Morbid obesity (HCC) She is on Ozempic 2 mg weekly.  We have been trying to get her Saxenda approved however insurance would not approve it.  We discussed options going forward.  We will try switching from Ozempic to [facility].  If insurance does not pay for this or if were not able to do this would consider adding on Victoza instead of Saxenda to her current dose of Ozempic.  MDD (major depressive disorder), recurrent severe, without psychosis (HCC) Follows with psychiatry.  Recently started ketamine nasal spray.  She is not sure if it is helping.  Diabetes mellitus type  2 in obese [facility]) She is currently on Synjardy 25-1000 once daily, Ozempic 2 mg weekly, and Tresiba 80 units daily.  Sugars are still in the upper 100s and low 200s.  We will check A1c today.  We will try switching her Ozempic over to [place] if insurance allows.  May consider adding on Victoza if insurance will not pay for either Saxenda or mounjaro.     Subjective:  HPI:  See A/P for status of chronic conditions.  Patient is here for follow-up.  Since our last visit she has underwent neck surgery.  Underwent cervical decompression and fusion.  She has been wearing a cervical collar which has been irritating.  She is also underwent fixation of left ulnar neuropathy.  She is recovering well from the surgery.  Still has significant mount of pain in her right shoulder blade.  Her neurosurgeon took her off of Celebrex due to concern that it may delay bone healing.  Pain is still not well controlled.        Objective:  Physical Exam: BP 109/79   Pulse (!) 103   Temp 98.7 F (37.1 C) (Temporal)   Ht 5\' 8"  (1.727 m)   Wt 249 lb 3.2 oz (113 kg)   LMP  (LMP Unknown) Comment: last period ---  1 year ago (?)  SpO2 96%   BMI 37.89 kg/m   Gen: No acute distress, resting comfortably Neuro: Grossly normal, moves  all extremities Psych: Normal affect and thought content      Time Spent: 50 minutes of total time was spent on the date of the encounter performing the following actions: chart review prior to seeing the patient including her recent surgery and specialist visits, obtaining history, performing a medically necessary exam, counseling on the treatment plan, placing orders, and documenting in our EHR.    Katina Degree. [name], MD 09/22/2020 12:41 PM

## 2020-09-22 NOTE — Assessment & Plan Note (Signed)
Status post surgery.  She is not sure if it is helped much with her right shoulder blade or other radicular symptoms at this point.  She will be following up with orthopedics again soon.  She is in a fair amount of pain and has had limited pain options.  She has been on tramadol in the past and had some tingling with this but she is willing to try again.  She does not want to do opioids.  Cannot take NSAIDs per direction of her orthopedist.  Her orthopedist had given her Valium and Robaxin both of which seem to be helping.  We will continue with these.

## 2020-09-22 NOTE — Assessment & Plan Note (Signed)
Multifactorial.  She has a fair amount of deconditioning as well.  She has been working with physical therapy for her recent ulnar nerve and cervical disc disorder.  She is interested in seeing a physical therapist for her low back and leg issues as well.  We will place referral today.

## 2020-09-23 DIAGNOSIS — F332 Major depressive disorder, recurrent severe without psychotic features: Secondary | ICD-10-CM | POA: Diagnosis not present

## 2020-09-25 LAB — ANA: Anti Nuclear Antibody (ANA): NEGATIVE

## 2020-09-26 NOTE — Progress Notes (Signed)
Please inform patient of the following:  Her A1c is up a little bit compared to last time.  Slightly above goal.  I hope that the medication changes we discussed at her office visit will help with this.  One of her informatory markers was mildly elevated-this is probably due to the recent surgery and is not at a level typically seen with any autoimmune or inflammatory conditions.  I would like to see her back in a few months to recheck A1c.

## 2020-09-27 DIAGNOSIS — F332 Major depressive disorder, recurrent severe without psychotic features: Secondary | ICD-10-CM | POA: Diagnosis not present

## 2020-09-28 NOTE — Progress Notes (Signed)
Upon chart review and recent letter provided by treating psychiatrist, bipolar affective disorder had been entered into her chart by a previous primary care provider. Her psychiatrist did not make this diagnosis, and in his psychiatric opinion patient should not have a diagnosis of bipolar disorder of any kind. He asked that this information be struck from her record.  

## 2020-09-30 DIAGNOSIS — Z9889 Other specified postprocedural states: Secondary | ICD-10-CM | POA: Diagnosis not present

## 2020-09-30 DIAGNOSIS — F332 Major depressive disorder, recurrent severe without psychotic features: Secondary | ICD-10-CM | POA: Diagnosis not present

## 2020-09-30 NOTE — Progress Notes (Signed)
Upon chart review and recent letter provided by treating psychiatrist, bipolar affective disorder had been entered into her chart by a previous primary care provider. Her psychiatrist did not make this diagnosis, and in his psychiatric opinion patient should not have a diagnosis of bipolar disorder of any kind. He asked that this information be struck from her record.  

## 2020-09-30 NOTE — Progress Notes (Signed)
Upon chart review and recent letter provided by treating psychiatrist, bipolar affective disorder had been entered into her chart by a previous primary care provider. Her psychiatrist did not make this diagnosis, and in his psychiatric opinion patient should not have a diagnosis of bipolar disorder of any kind. He asked that this information be struck from her record.

## 2020-10-02 ENCOUNTER — Other Ambulatory Visit: Payer: Self-pay | Admitting: Psychiatry

## 2020-10-02 DIAGNOSIS — F332 Major depressive disorder, recurrent severe without psychotic features: Secondary | ICD-10-CM

## 2020-10-03 DIAGNOSIS — I10 Essential (primary) hypertension: Secondary | ICD-10-CM | POA: Diagnosis not present

## 2020-10-03 DIAGNOSIS — G4733 Obstructive sleep apnea (adult) (pediatric): Secondary | ICD-10-CM | POA: Diagnosis not present

## 2020-10-04 DIAGNOSIS — F332 Major depressive disorder, recurrent severe without psychotic features: Secondary | ICD-10-CM | POA: Diagnosis not present

## 2020-10-05 ENCOUNTER — Other Ambulatory Visit: Payer: Self-pay

## 2020-10-05 ENCOUNTER — Ambulatory Visit (INDEPENDENT_AMBULATORY_CARE_PROVIDER_SITE_OTHER): Payer: BC Managed Care – PPO | Admitting: Physical Therapy

## 2020-10-05 DIAGNOSIS — M6281 Muscle weakness (generalized): Secondary | ICD-10-CM

## 2020-10-05 DIAGNOSIS — G8929 Other chronic pain: Secondary | ICD-10-CM

## 2020-10-05 DIAGNOSIS — M542 Cervicalgia: Secondary | ICD-10-CM

## 2020-10-05 DIAGNOSIS — M545 Low back pain, unspecified: Secondary | ICD-10-CM

## 2020-10-05 NOTE — Patient Instructions (Signed)
Access Code: [email or web address] URL: [email or web address] Date: 10/05/2020 Prepared by: Sedalia Muta  Exercises Seated Shoulder Rolls - 2 x daily - 1 sets - 10 reps Seated Scapular Retraction - 2 x daily - 1 sets - 10 reps Seated Gripping Towel - 2 x daily - 1 sets - 10 reps - 3 hold

## 2020-10-06 DIAGNOSIS — F332 Major depressive disorder, recurrent severe without psychotic features: Secondary | ICD-10-CM | POA: Diagnosis not present

## 2020-10-07 ENCOUNTER — Telehealth: Payer: Self-pay | Admitting: Psychiatry

## 2020-10-07 NOTE — Telephone Encounter (Signed)
Ok Great

## 2020-10-07 NOTE — Telephone Encounter (Signed)
Added- I will call her and let her know she can pick up samples before weekend.

## 2020-10-07 NOTE — Telephone Encounter (Signed)
Pt is trying to fill Viibryd at [facility]. She was told earlier this week that it needs a P.A. but we haven't received PA request. She has [employer], QD:[phone]. Can we get PA for her. She will run out this weekend.

## 2020-10-07 NOTE — Telephone Encounter (Signed)
Please review

## 2020-10-07 NOTE — Telephone Encounter (Signed)
Pt's PA has already been sent, it's pending.

## 2020-10-10 NOTE — Telephone Encounter (Signed)
Pt's prior authorization for Viibryd 40 mg 1.5 tablets daily effective 10/06/2020-10/05/2020

## 2020-10-11 ENCOUNTER — Other Ambulatory Visit: Payer: Self-pay

## 2020-10-11 ENCOUNTER — Ambulatory Visit (INDEPENDENT_AMBULATORY_CARE_PROVIDER_SITE_OTHER): Payer: BC Managed Care – PPO | Admitting: Physical Therapy

## 2020-10-11 DIAGNOSIS — M545 Low back pain, unspecified: Secondary | ICD-10-CM

## 2020-10-11 DIAGNOSIS — M542 Cervicalgia: Secondary | ICD-10-CM | POA: Diagnosis not present

## 2020-10-11 DIAGNOSIS — G8929 Other chronic pain: Secondary | ICD-10-CM | POA: Diagnosis not present

## 2020-10-11 DIAGNOSIS — M6281 Muscle weakness (generalized): Secondary | ICD-10-CM

## 2020-10-11 DIAGNOSIS — F332 Major depressive disorder, recurrent severe without psychotic features: Secondary | ICD-10-CM | POA: Diagnosis not present

## 2020-10-13 ENCOUNTER — Encounter: Payer: Self-pay | Admitting: Physical Therapy

## 2020-10-13 ENCOUNTER — Telehealth: Payer: Self-pay

## 2020-10-13 DIAGNOSIS — F332 Major depressive disorder, recurrent severe without psychotic features: Secondary | ICD-10-CM | POA: Diagnosis not present

## 2020-10-13 DIAGNOSIS — M797 Fibromyalgia: Secondary | ICD-10-CM | POA: Diagnosis not present

## 2020-10-13 DIAGNOSIS — R531 Weakness: Secondary | ICD-10-CM | POA: Diagnosis not present

## 2020-10-13 DIAGNOSIS — F419 Anxiety disorder, unspecified: Secondary | ICD-10-CM | POA: Diagnosis not present

## 2020-10-13 DIAGNOSIS — R5383 Other fatigue: Secondary | ICD-10-CM | POA: Diagnosis not present

## 2020-10-13 NOTE — Telephone Encounter (Signed)
  Encourage patient to contact the pharmacy for refills or they can request refills through [facility]  LAST APPOINTMENT DATE:  09/22/20  NEXT APPOINTMENT DATE: 12/23/20  MEDICATION: Dorathy Daft 2  PHARMACY: [facility] - [place], [place] - [address]  COMMENTS: [place] states that the patient is needing a new prescription.

## 2020-10-14 ENCOUNTER — Encounter: Payer: Self-pay | Admitting: Physical Therapy

## 2020-10-14 ENCOUNTER — Other Ambulatory Visit: Payer: Self-pay

## 2020-10-14 ENCOUNTER — Encounter: Payer: Self-pay | Admitting: Family Medicine

## 2020-10-14 ENCOUNTER — Ambulatory Visit (INDEPENDENT_AMBULATORY_CARE_PROVIDER_SITE_OTHER): Payer: BC Managed Care – PPO | Admitting: Family Medicine

## 2020-10-14 VITALS — BP 104/76 | HR 107 | Ht 68.0 in | Wt 255.0 lb

## 2020-10-14 DIAGNOSIS — M546 Pain in thoracic spine: Secondary | ICD-10-CM | POA: Diagnosis not present

## 2020-10-14 DIAGNOSIS — L815 Leukoderma, not elsewhere classified: Secondary | ICD-10-CM | POA: Diagnosis not present

## 2020-10-14 DIAGNOSIS — I781 Nevus, non-neoplastic: Secondary | ICD-10-CM | POA: Diagnosis not present

## 2020-10-14 NOTE — Progress Notes (Signed)
I, Christoper Fabian, LAT, ATC, am serving as scribe for Dr. [name].  Deborah Soto is a 55 y.o. female who presents to [facility] today for mid-back pain. Pt was previously seen by Dr. Denyse Amass on 05/11/20 for cervical radiculitis. Of note, pt had an anterior cervical fusion done by Dr. Yevette Edwards on 08/17/20. Today, pt reports mid-back pain x approximately one month. Pt was seen at an UC and was told it was a muscle strain and to take IBU and ice, but this has not helped. Pt locates pain to her mid-back, radiating into her R scapula and shoulder.  Radiating pain: yes into her R scap and shoulder Aggravates: sleeping; transitioning from sitting to standing; any type of mov't Treatments tried: IBU, ice, Flexeril, Valium  Dx imaging: 07/26/20 T-spine XR  05/04/20 L-spine MRI  03/14/20 L-spine & T-spine XR  12/12/16 L-spine XR  Pertinent review of systems: No fevers or chills  Relevant historical information: Cervical fusion about 2 months ago.   Exam:  BP 104/76 (BP Location: Left Arm, Patient Position: Sitting, Cuff Size: Large)   Pulse (!) 107   Ht 5\' 8"  (1.727 m)   Wt 255 lb (115.7 kg)   LMP  (LMP Unknown) Comment: last period ---  1 year ago (?)  SpO2 95%   BMI 38.77 kg/m  General: Well Developed, well nourished, and in no acute distress.   MSK: C-spine nontender midline.  Tender palpation right thoracic paraspinal musculature.  Decreased thoracic motion.    Lab and Radiology Results EXAM: MRI THORACIC SPINE WITHOUT CONTRAST   TECHNIQUE: Multiplanar, multisequence MR imaging of the thoracic spine was performed. No intravenous contrast was administered.   COMPARISON:  None.   FINDINGS: Alignment:  Normal.   Vertebrae: Vertebral body heights are maintained. No specific evidence of acute fracture, discitis/osteomyelitis, or suspicious bone lesion.   Cord:  Normal cord signal.   Paraspinal and other soft tissues: Unremarkable.   Disc  levels:   There are posterior disc bulges at T3-T4, T5-T6, T6-T7, T7-T8 and T8-T9. The largest disc protrusion is at T6-T7, where it contacts and flattens the cord. No significant canal stenosis. Multilevel facet hypertrophy with mild to moderate bilateral foraminal stenosis at T8-T9 and mild bilateral foraminal stenosis at T9-T10.   IMPRESSION: 1. Multilevel disc protrusions, largest at T6-T7 with disc contacting and flattening the ventral cord. No significant canal stenosis. 2. Mild-to-moderate foraminal stenosis at T8-T9 and mild bilateral foraminal stenosis at T9-T10. 3. Normal cord signal.     Electronically Signed   By: MD   On: 07/28/2020 09:10 I, 07/30/2020, personally (independently) visualized and performed the interpretation of the images attached in this note.     Assessment and Plan: 55 y.o. female with pain right thoracic spine area.  This occurred about a month after her cervical fusion.  I think it is because she has not been able to use her body normally with a neck brace and following surgery.  Pain is due to muscle spasm.  Plan for physical therapy.  She is already seeing physical therapy for surgical recovery. Recheck in a month.   PDMP not reviewed this encounter. Orders Placed This Encounter  Procedures   Ambulatory referral to Physical Therapy    Referral Priority:   Routine    Referral Type:   Physical Medicine    Referral Reason:   Specialty Services Required    Requested Specialty:   Physical Therapy  Number of Visits Requested:   1   No orders of the defined types were placed in this encounter.    Discussed warning signs or symptoms. Please see discharge instructions. Patient expresses understanding.   The above documentation has been reviewed and is accurate and complete [name], M.D.

## 2020-10-14 NOTE — Therapy (Signed)
[facility] [address] [place], [place], [address] Phone: [phone]   Fax:  [phone]  Physical Therapy Evaluation  Patient Details  Name: Deborah Soto MRN: [id number] Date of Birth: [birth year] Referring Provider (PT): Jacquiline Doe, Loraine Leriche   Encounter Date: 10/05/2020   PT End of Session - 10/13/20 2118     Visit Number 1    Number of Visits 16    Date for PT Re-Evaluation 11/30/20    Authorization Type BCBS    PT Start Time 1300    PT Stop Time 1345    PT Time Calculation (min) 45 min    Activity Tolerance Patient tolerated treatment well    Behavior During Therapy [facility] for tasks assessed/performed             Past Medical History:  Diagnosis Date   Anemia    Anxiety    Asthma    Depression    Diabetes mellitus type 2 in obese (HCC) 06/11/2016   Dyspnea    Embolism - blood clot January 1997 & January 2017   Also had LLE DVT in 1997   History of kidney stones    Hypertension    Preeclampsia 10/10/2011   1999    Sleep apnea     Past Surgical History:  Procedure Laterality Date   ANTERIOR CERVICAL DECOMPRESSION/DISCECTOMY FUSION 4 LEVELS N/A 08/17/2020   Procedure: ANTERIOR CERVICAL DECOMPRESSION FUSION CERVICAL THREE - CERVICAL FOUR, CERVICAL FOUR- CERVICAL FIVE, CERVICAL FIVE- CERVICAL SIX WITH INSTRUMENTATION AND ALLOGRAFT;  Surgeon: Estill Bamberg, MD;  Location: [facility] OR;  Service: Orthopedics;  Laterality: N/A;   KIDNEY STONE SURGERY      There were no vitals filed for this visit.    Subjective Assessment - 10/14/20 0906     Subjective Pt referred for neck pain and diffuse pain.  Pt had cervical fusion on 6/8, states she has bone stimulator, 3 verterae fused. Has had some relief of  neck pain with tihs. Does state hand/arm weakness bilaterally. She also has history of cubital tunnel surgery on L. Has pain into R shoulder blade. Pt has several pain locations, including  bilateral Knee pain, and weakness,   and pain in R thoracic spine that is very bothersome. Back pain with standing, sitting, stairs, and most activity.  She was on Celebrex but currently is not, did think that was helpting. She hsa concern for recent bloodwork with + ANA.  She also has concern for feeling weakness in her legs "for years".States she  trips a lot, stumbles, close falls , 3 falls. She has had PT in the past for neck/UE pain, prior to having surgery. Pt feels very frustrated with all the things going on with her, and has been much less active recently due to pain and deficits, pt would like to be more active. She is not working at this time.    Pertinent History ACDF 6/8, c3-6; DM, Fibromyalgia, Pain in multiple locations,    Limitations Sitting;Lifting;Standing;Walking;House hold activities    Patient Stated Goals decreased pain in neck, back, increased strength in legs, arms, and improved abilityfor functional activity.    Currently in Pain? Yes    Pain Score 5     Pain Location Neck    Pain Orientation Right;Left    Pain Descriptors / Indicators Aching    Pain Type Chronic pain;Acute pain    Pain Radiating Towards R shoulder, UT, into R shoulder blade.    Pain Onset More than a month  ago    Pain Frequency Intermittent    Multiple Pain Sites Yes    Pain Score 8    Pain Location Back    Pain Orientation Right    Pain Descriptors / Indicators Aching;Tightness;Sore    Pain Type Acute pain;Chronic pain    Pain Onset More than a month ago    Pain Frequency Intermittent    Aggravating Factors  sitting, standing, transfers, stairs.    Pain Score 6    Pain Location Knee    Pain Orientation Right;Left    Pain Descriptors / Indicators Aching;Sore    Pain Type Chronic pain    Pain Onset More than a month ago    Pain Frequency Intermittent    Aggravating Factors  standing, staris, walking, squatting                [facility] PT Assessment - 10/14/20 0001       Assessment   Medical Diagnosis Difuse pain, Neck pain     Referring Provider (PT) Jacquiline Doe, Mark    Onset Date/Surgical Date --   ACDF on 08/17/20.     Precautions   Precautions Cervical;Fall      Balance Screen   Has the patient fallen in the past 6 months Yes    How many times? 2    Has the patient had a decrease in activity level because of a fear of falling?  Yes    Is the patient reluctant to leave their home because of a fear of falling?  Yes      Prior Function   Level of Independence Independent      Cognition   Overall Cognitive Status Within Functional Limits for tasks assessed      ROM / Strength   AROM / PROM / Strength AROM;Strength      AROM   Overall AROM Comments Lumbar: mild/mod liimtation for ext and flex due to pain; Hips: mild limitations    AROM Assessment Site Cervical    Cervical Flexion 30    Cervical Extension mod/significant limitation    Cervical - Right Rotation 50    Cervical - Left Rotation 50      Strength   Overall Strength Comments UE: 4/5 gross;  LE: Hips: 4-/5, Knees: 4/5;    Strength Assessment Site Hand    Right/Left hand Right;Left    Right Hand Grip (lbs) 30    Left Hand Grip (lbs) 30      Palpation   Palpation comment Tenderness in R UT, R rhomboid and med scap border:  Pain in R thoracic paraspinals,QL, and R low back, Tenderness in C-spine, L-SPine and T-spine,  LEs tender with light palpation;      Ambulation/Gait   Gait Comments Gait: slow speed:  Stairs: slow speed cautious, pain in back and knees.Dynamic balance: Good -  ,  further balance/coordination to be tested as needed.                        Objective measurements completed on examination: See above findings.       [facility] Adult PT Treatment/Exercise - 10/14/20 0001       Exercises   Exercises Neck;Lumbar      Neck Exercises: Theraband   Scapula Retraction 10 reps    Rows Red;15 reps      Neck Exercises: Seated   Neck Retraction 10 reps    Other Seated Exercise gripping x 20;  Neck  Exercises: Stretches   Upper Trapezius Stretch 3 reps;30 seconds    Upper Trapezius Stretch Limitations seated    Chest Stretch 10 seconds;3 reps    Chest Stretch Limitations doorway 60 deg.      Lumbar Exercises: Seated   Sit to Stand 10 reps                    PT Education - 10/13/20 2117     Education Details PT POC, Exam findings, HEP    Person(s) Educated Patient    Methods Explanation;Demonstration;Tactile cues;Verbal cues;Handout    Comprehension Verbalized understanding;Returned demonstration;Verbal cues required;Tactile cues required;Need further instruction              PT Short Term Goals - 10/13/20 2125       PT SHORT TERM GOAL #1   Title Pt to be independent wtih initial HEP    Time 2    Period Weeks    Status New    Target Date 10/19/20               PT Long Term Goals - 10/13/20 2126       PT LONG TERM GOAL #1   Title Pt to be independent with final HEP    Time 8    Period Weeks    Status New    Target Date 11/30/20      PT LONG TERM GOAL #2   Title Pt to report decreased pain in neck and shoulders to 0-2/10 with ROM and UE activities, to improve ability for ADLS and IADLS.    Time 8    Period Weeks    Status New    Target Date 11/30/20      PT LONG TERM GOAL #3   Title Pt to demo improved grip strength by at least 10 lb bilaterally, to improve ability for lift, carry,IADLS and fine motor tasks    Time 8    Period Weeks    Status New    Target Date 11/30/20      PT LONG TERM GOAL #4   Title Pt to demo improved strength of bil LEs to at least 4+/5 to improve ability for transfers, stairs, and walking.    Time 8    Period Weeks    Status New    Target Date 11/30/20      PT LONG TERM GOAL #5   Title Pt to demo dynamic balance to be WNL (for pt age) with gait and stairs, to improve ability for community and functional activity, and to decrease risk for falls.    Time 8    Period Weeks    Status New    Target Date  11/30/20                    Plan - 10/13/20 2122     Clinical Impression Statement Pt presents wtih pain in multiple locations. Pt with recent cervical fusion, with pain, ROM, strength, and muscle tension deficits. She does have weakness in bil UEs and grip strength for her age. Pt with R sided thoracic/lumbar back pain that limits most activities, transfers, and IADLs. Pt also with widespread pain in bil knees and legs. She has overall weakness and is deconditioned, with decreased activity level. Pt with balance deficits for her age, states multiple trips, and near falls, and is a fall risk. Pt with multiple deficits, and will require skilled PT to improve deficits, pain, and to  increase ability for fuctional activities, IADLs, exercise and community activity. Due to multiple deficits, expect that pt may need longer than expected time frame for PT Plan of care.    Personal Factors and Comorbidities Comorbidity 1;Fitness;Time since onset of injury/illness/exacerbation    Examination-Activity Limitations Lift;Stand;Bend;Reach Overhead;Caring for Others;Carry;Squat;Stairs    Examination-Participation Restrictions Cleaning;Meal Prep;Yard Work;Community Activity;Shop;Laundry;Driving    Stability/Clinical Decision Making Evolving/Moderate complexity    Clinical Decision Making Moderate    Rehab Potential Good    PT Frequency 2x / week    PT Duration 8 weeks    PT Treatment/Interventions ADLs/Self Care Home Management;Cryotherapy;Electrical Stimulation;DME Instruction;Ultrasound;Iontophoresis 4mg /ml Dexamethasone;Moist Heat;Traction;Gait training;Stair training;Functional mobility training;Therapeutic activities;Therapeutic exercise;Balance training;Neuromuscular re-education;Patient/family education;Manual techniques;Passive range of motion;Dry needling;Taping;Joint Manipulations;Spinal Manipulations;Energy conservation;Vasopneumatic Device;Orthotic Fit/Training    PT Home Exercise Plan     Consulted and Agree with Plan of Care Patient             Patient will benefit from skilled therapeutic intervention in order to improve the following deficits and impairments:  Abnormal gait, Pain, Improper body mechanics, Decreased mobility, Decreased activity tolerance, Decreased range of motion, Decreased endurance, Decreased strength, Impaired perceived functional ability, Impaired UE functional use, Impaired flexibility, Decreased safety awareness, Decreased balance  Visit Diagnosis: Muscle weakness (generalized)  Cervicalgia  Chronic right-sided low back pain without sciatica     Problem List Patient Active Problem List   Diagnosis Date Noted   Radiculopathy 08/17/2020   Rash 07/28/2020   Vitamin D deficiency 05/03/2020   Neuropathy 04/06/2020   S/P ECT (electroconvulsive therapy) 06/02/2019   Memory loss due to medical condition 06/02/2019   Intention tremor 06/02/2019   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   OSA on CPAP 01/26/2019   Long term use of drug 10/08/2018   Insomnia 10/08/2018   Anxiety 10/08/2018   Tremors of nervous system 10/08/2018   Acne 01/10/2018   Primary hypercoagulable state (HCC) 10/10/2017   Vitamin B12 deficiency 03/18/2017   Diffuse pain 02/22/2017   Morbid obesity (HCC) 06/22/2016   MDD (major depressive disorder), recurrent severe, 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 [place], [place], [place] Phone: [phone]   Fax:  [phone]  Name: Myla Mauriello MRN: [name] Date of Birth: May 19, 1965

## 2020-10-14 NOTE — Therapy (Signed)
[facility] [address] [place], [place], [address] Phone: [phone]   Fax:  [phone]  Physical Therapy Treatment  Patient Details  Name: Deborah Soto MRN: [id number] Date of Birth: 23-Sep-1965 Referring Provider (PT): Jacquiline Doe, Loraine Leriche   Encounter Date: 10/11/2020   PT End of Session - 10/14/20 1441     Visit Number 2    Number of Visits 16    Date for PT Re-Evaluation 11/30/20    Authorization Type BCBS    PT Start Time 1304    PT Stop Time 1345    PT Time Calculation (min) 41 min    Activity Tolerance Patient tolerated treatment well    Behavior During Therapy [facility] for tasks assessed/performed             Past Medical History:  Diagnosis Date   Anemia    Anxiety    Asthma    Depression    Diabetes mellitus type 2 in obese (HCC) 06/11/2016   Dyspnea    Embolism - blood clot January 1997 & January 2017   Also had LLE DVT in 1997   History of kidney stones    Hypertension    Preeclampsia 10/10/2011   1999    Sleep apnea     Past Surgical History:  Procedure Laterality Date   ANTERIOR CERVICAL DECOMPRESSION/DISCECTOMY FUSION 4 LEVELS N/A 08/17/2020   Procedure: ANTERIOR CERVICAL DECOMPRESSION FUSION CERVICAL THREE - CERVICAL FOUR, CERVICAL FOUR- CERVICAL FIVE, CERVICAL FIVE- CERVICAL SIX WITH INSTRUMENTATION AND ALLOGRAFT;  Surgeon: Estill Bamberg, MD;  Location: [facility] OR;  Service: Orthopedics;  Laterality: N/A;   KIDNEY STONE SURGERY      There were no vitals filed for this visit.   Subjective Assessment - 10/14/20 1440     Subjective Pt states continued pain in neck, shoulders, back is really bothering her too.    Currently in Pain? Yes                               [facility] Adult PT Treatment/Exercise - 10/14/20 1445       Ambulation/Gait   Gait Comments Gait: slow speed:  Stairs: slow speed cautious, pain in back and knees.Dynamic balance: Good -  ,  further balance/coordination to  be tested as needed.      Exercises   Exercises Neck;Lumbar      Neck Exercises: Theraband   Scapula Retraction 10 reps    Rows 20 reps;Red    Shoulder External Rotation 20 reps    Shoulder External Rotation Limitations YTB      Neck Exercises: Seated   Neck Retraction 10 reps    Cervical Rotation 10 reps    Shoulder Rolls 10 reps    Other Seated Exercise gripping x 20;      Neck Exercises: Stretches   Upper Trapezius Stretch 3 reps;30 seconds    Upper Trapezius Stretch Limitations seated    Chest Stretch 10 seconds;3 reps    Chest Stretch Limitations doorway 60 deg.      Lumbar Exercises: Stretches   Single Knee to Chest Stretch 2 reps;30 seconds;Right;Left    Lower Trunk Rotation 5 reps;10 seconds    Other Lumbar Stretch Exercise supine shoulder flexion with cane, in attempts for LAT stretch 10 sec x 5;      Lumbar Exercises: Seated   Sit to Stand 10 reps  PT Short Term Goals - 10/13/20 2125       PT SHORT TERM GOAL #1   Title Pt to be independent wtih initial HEP    Time 2    Period Weeks    Status New    Target Date 10/19/20               PT Long Term Goals - 10/13/20 2126       PT LONG TERM GOAL #1   Title Pt to be independent with final HEP    Time 8    Period Weeks    Status New    Target Date 11/30/20      PT LONG TERM GOAL #2   Title Pt to report decreased pain in neck and shoulders to 0-2/10 with ROM and UE activities, to improve ability for ADLS and IADLS.    Time 8    Period Weeks    Status New    Target Date 11/30/20      PT LONG TERM GOAL #3   Title Pt to demo improved grip strength by at least 10 lb bilaterally, to improve ability for lift, carry,IADLS and fine motor tasks    Time 8    Period Weeks    Status New    Target Date 11/30/20      PT LONG TERM GOAL #4   Title Pt to demo improved strength of bil LEs to at least 4+/5 to improve ability for transfers, stairs, and walking.    Time 8     Period Weeks    Status New    Target Date 11/30/20      PT LONG TERM GOAL #5   Title Pt to demo dynamic balance to be WNL (for pt age) with gait and stairs, to improve ability for community and functional activity, and to decrease risk for falls.    Time 8    Period Weeks    Status New    Target Date 11/30/20                   Plan - 10/14/20 1442     Clinical Impression Statement Focus on ther ex and education on HEP, for cervical ROM, and UE/postural strengthening today. Pt with good ability for exercises. HEP updated. Discussed continuing comfortable cervical ROM to decrease stiffness. Pt with improved ability for sit to stand transfers after education and practice today. Pt to benefit from continued strengthening for UEs, grip, manual for decreasing muscle tension in c-spine, and further work on t/l spine at next visit due to continued pain in this region.    Personal Factors and Comorbidities Comorbidity 1;Fitness;Time since onset of injury/illness/exacerbation    Examination-Activity Limitations Lift;Stand;Bend;Reach Overhead;Caring for Others;Carry;Squat;Stairs    Examination-Participation Restrictions Cleaning;Meal Prep;Yard Work;Community Activity;Shop;Laundry;Driving    Stability/Clinical Decision Making Evolving/Moderate complexity    Rehab Potential Good    PT Frequency 2x / week    PT Duration 8 weeks    PT Treatment/Interventions ADLs/Self Care Home Management;Cryotherapy;Electrical Stimulation;DME Instruction;Ultrasound;Iontophoresis 4mg /ml Dexamethasone;Moist Heat;Traction;Gait training;Stair training;Functional mobility training;Therapeutic activities;Therapeutic exercise;Balance training;Neuromuscular re-education;Patient/family education;Manual techniques;Passive range of motion;Dry needling;Taping;Joint Manipulations;Spinal Manipulations;Energy conservation;Vasopneumatic Device;Orthotic Fit/Training    PT Home Exercise Plan    Consulted and Agree with  Plan of Care Patient             Patient will benefit from skilled therapeutic intervention in order to improve the following deficits and impairments:  Abnormal gait, Pain, Improper body mechanics, Decreased mobility, Decreased  activity tolerance, Decreased range of motion, Decreased endurance, Decreased strength, Impaired perceived functional ability, Impaired UE functional use, Impaired flexibility, Decreased safety awareness, Decreased balance  Visit Diagnosis: Muscle weakness (generalized)  Cervicalgia  Chronic right-sided low back pain without sciatica     Problem List Patient Active Problem List   Diagnosis Date Noted   Radiculopathy 08/17/2020   Rash 07/28/2020   Vitamin D deficiency 05/03/2020   Neuropathy 04/06/2020   S/P ECT (electroconvulsive therapy) 06/02/2019   Memory loss due to medical condition 06/02/2019   Intention tremor 06/02/2019   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   OSA on CPAP 01/26/2019   Long term use of drug 10/08/2018   Insomnia 10/08/2018   Anxiety 10/08/2018   Tremors of nervous system 10/08/2018   Acne 01/10/2018   Primary hypercoagulable state (HCC) 10/10/2017   Vitamin B12 deficiency 03/18/2017   Diffuse pain 02/22/2017   Morbid obesity (HCC) 06/22/2016   MDD (major depressive disorder), recurrent severe, 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 [place], [place], [address] Phone: [phone]   Fax:  [phone]  Name: Deborah Soto MRN: [id number] Date of Birth: 09/26/1965

## 2020-10-14 NOTE — Patient Instructions (Addendum)
Thank you for coming in today.   Massage gun.   Heat can help.   PT is the main intervention.   If that does not help I can organize injection.   Recheck in 1 month.    TENS UNIT: This is helpful for muscle pain and spasm.   Search and Purchase a TENS 7000 2nd edition at  www.tenspros.com or www.Amazon.com It should be less than $30.     TENS unit instructions: Do not shower or bathe with the unit on Turn the unit off before removing electrodes or batteries If the electrodes lose stickiness add a drop of water to the electrodes after they are disconnected from the unit and place on plastic sheet. If you continued to have difficulty, call the TENS unit company to purchase more electrodes. Do not apply lotion on the skin area prior to use. Make sure the skin is clean and dry as this will help prolong the life of the electrodes. After use, always check skin for unusual red areas, rash or other skin difficulties. If there are any skin problems, does not apply electrodes to the same area. Never remove the electrodes from the unit by pulling the wires. Do not use the TENS unit or electrodes other than as directed. Do not change electrode placement without consultating your therapist or physician. Keep 2 fingers with between each electrode. Wear time ratio is 2:1, on to off times.    For example on for 30 minutes off for 15 minutes and then on for 30 minutes off for 15 minutes

## 2020-10-15 NOTE — Telephone Encounter (Signed)
Ok with me. Please place any necessary orders. 

## 2020-10-17 ENCOUNTER — Other Ambulatory Visit: Payer: Self-pay

## 2020-10-17 ENCOUNTER — Other Ambulatory Visit: Payer: Self-pay | Admitting: *Deleted

## 2020-10-17 ENCOUNTER — Encounter: Payer: Self-pay | Admitting: Physical Therapy

## 2020-10-17 ENCOUNTER — Ambulatory Visit (INDEPENDENT_AMBULATORY_CARE_PROVIDER_SITE_OTHER): Payer: BC Managed Care – PPO | Admitting: Physical Therapy

## 2020-10-17 DIAGNOSIS — M6281 Muscle weakness (generalized): Secondary | ICD-10-CM | POA: Diagnosis not present

## 2020-10-17 DIAGNOSIS — M545 Low back pain, unspecified: Secondary | ICD-10-CM | POA: Diagnosis not present

## 2020-10-17 DIAGNOSIS — M542 Cervicalgia: Secondary | ICD-10-CM | POA: Diagnosis not present

## 2020-10-17 DIAGNOSIS — G8929 Other chronic pain: Secondary | ICD-10-CM | POA: Diagnosis not present

## 2020-10-17 MED ORDER — FREESTYLE LIBRE 2 READER DEVI: 0 refills | Status: DC

## 2020-10-17 MED ORDER — FREESTYLE LIBRE 2 SENSOR MISC: 0 refills | Status: DC

## 2020-10-17 NOTE — Telephone Encounter (Signed)
Rx order  

## 2020-10-17 NOTE — Therapy (Signed)
[facility] [address] [place], [place], [address] Phone: [phone]   Fax:  [phone]  Physical Therapy Treatment  Patient Details  Name: Deborah Soto MRN: [id number] Date of Birth: [birth year] Referring Provider (PT): Jacquiline Doe, Loraine Leriche   Encounter Date: 10/17/2020   PT End of Session - 10/17/20 2113     Visit Number 3    Number of Visits 16    Date for PT Re-Evaluation 11/30/20    Authorization Type BCBS    PT Start Time 1217    PT Stop Time 1300    PT Time Calculation (min) 43 min    Activity Tolerance Patient tolerated treatment well    Behavior During Therapy [facility] for tasks assessed/performed             Past Medical History:  Diagnosis Date   Anemia    Anxiety    Asthma    Depression    Diabetes mellitus type 2 in obese (HCC) 06/11/2016   Dyspnea    Embolism - blood clot January 1997 & January 2017   Also had LLE DVT in 1997   History of kidney stones    Hypertension    Preeclampsia 10/10/2011   1999    Sleep apnea     Past Surgical History:  Procedure Laterality Date   ANTERIOR CERVICAL DECOMPRESSION/DISCECTOMY FUSION 4 LEVELS N/A 08/17/2020   Procedure: ANTERIOR CERVICAL DECOMPRESSION FUSION CERVICAL THREE - CERVICAL FOUR, CERVICAL FOUR- CERVICAL FIVE, CERVICAL FIVE- CERVICAL SIX WITH INSTRUMENTATION AND ALLOGRAFT;  Surgeon: Estill Bamberg, MD;  Location: [facility] OR;  Service: Orthopedics;  Laterality: N/A;   KIDNEY STONE SURGERY      There were no vitals filed for this visit.   Subjective Assessment - 10/17/20 2112     Subjective Pt states " im in a lot of pain today, allover" .    Currently in Pain? Yes    Pain Score 5     Pain Location Neck    Pain Orientation Right;Left    Pain Descriptors / Indicators Aching    Pain Type Chronic pain;Acute pain    Pain Onset More than a month ago    Pain Frequency Intermittent    Pain Score 8    Pain Location Back    Pain Orientation Right    Pain  Descriptors / Indicators Aching    Pain Type Chronic pain;Acute pain    Pain Onset More than a month ago    Pain Frequency Intermittent    Pain Score 6    Pain Location Knee    Pain Orientation Left;Right    Pain Descriptors / Indicators Aching;Sore    Pain Type Chronic pain    Pain Onset More than a month ago    Pain Frequency Intermittent                               [facility] Adult PT Treatment/Exercise - 10/17/20 0001       Ambulation/Gait   Gait Comments Gait: slow speed:  Stairs: slow speed cautious, pain in back and knees.Dynamic balance: Good -  ,  further balance/coordination to be tested as needed.      Exercises   Exercises Neck;Lumbar      Neck Exercises: Theraband   Scapula Retraction 10 reps    Rows 20 reps;Green    Shoulder External Rotation 20 reps    Shoulder External Rotation Limitations YTB  Neck Exercises: Standing   Other Standing Exercises shoulder AROM, scaption, x 10      Neck Exercises: Seated   Neck Retraction 10 reps    Cervical Rotation 10 reps    Shoulder Rolls --    Other Seated Exercise modified cat/cow x 15;      Neck Exercises: Supine   Other Supine Exercise Pec stretch x 2 min with nerve glide x 10 bil; shoulder  Horizontal abd  x10      Neck Exercises: Stretches   Upper Trapezius Stretch 3 reps;30 seconds    Upper Trapezius Stretch Limitations seated    Chest Stretch --    Chest Stretch Limitations .      Lumbar Exercises: Stretches   Single Knee to Chest Stretch --    Lower Trunk Rotation 5 reps;10 seconds    Other Lumbar Stretch Exercise supine shoulder flexion with cane, in attempts for LAT stretch 10 sec x 5;      Lumbar Exercises: Seated   Sit to Stand 10 reps      Manual Therapy   Manual Therapy Joint mobilization;Soft tissue mobilization;Passive ROM    Soft tissue mobilization STM/DTM to R thoracic and lumbar paraspinals in s/l;  TPR to R UT and levator, into R rhomboid.                       PT Short Term Goals - 10/13/20 2125       PT SHORT TERM GOAL #1   Title Pt to be independent wtih initial HEP    Time 2    Period Weeks    Status New    Target Date 10/19/20               PT Long Term Goals - 10/13/20 2126       PT LONG TERM GOAL #1   Title Pt to be independent with final HEP    Time 8    Period Weeks    Status New    Target Date 11/30/20      PT LONG TERM GOAL #2   Title Pt to report decreased pain in neck and shoulders to 0-2/10 with ROM and UE activities, to improve ability for ADLS and IADLS.    Time 8    Period Weeks    Status New    Target Date 11/30/20      PT LONG TERM GOAL #3   Title Pt to demo improved grip strength by at least 10 lb bilaterally, to improve ability for lift, carry,IADLS and fine motor tasks    Time 8    Period Weeks    Status New    Target Date 11/30/20      PT LONG TERM GOAL #4   Title Pt to demo improved strength of bil LEs to at least 4+/5 to improve ability for transfers, stairs, and walking.    Time 8    Period Weeks    Status New    Target Date 11/30/20      PT LONG TERM GOAL #5   Title Pt to demo dynamic balance to be WNL (for pt age) with gait and stairs, to improve ability for community and functional activity, and to decrease risk for falls.    Time 8    Period Weeks    Status New    Target Date 11/30/20  Plan - 10/17/20 2119     Clinical Impression Statement Pt with noted soreness in thoracic and lumbar paraspinals on R. Pt with difficulty with positioning for optimal t-spine mobility, able to perform modified cat/cow in seated. Discussed seated posture for neck and back positioning. Pt with widespread pain, but did well with ther ex performed today. Plan to continue pain relief for t/l spine,and progress back and postural strengthening as tolerated.    Personal Factors and Comorbidities Comorbidity 1;Fitness;Time since onset of  injury/illness/exacerbation    Examination-Activity Limitations Lift;Stand;Bend;Reach Overhead;Caring for Others;Carry;Squat;Stairs    Examination-Participation Restrictions Cleaning;Meal Prep;Yard Work;Community Activity;Shop;Laundry;Driving    Stability/Clinical Decision Making Evolving/Moderate complexity    Rehab Potential Good    PT Frequency 2x / week    PT Duration 8 weeks    PT Treatment/Interventions ADLs/Self Care Home Management;Cryotherapy;Electrical Stimulation;DME Instruction;Ultrasound;Iontophoresis 4mg /ml Dexamethasone;Moist Heat;Traction;Gait training;Stair training;Functional mobility training;Therapeutic activities;Therapeutic exercise;Balance training;Neuromuscular re-education;Patient/family education;Manual techniques;Passive range of motion;Dry needling;Taping;Joint Manipulations;Spinal Manipulations;Energy conservation;Vasopneumatic Device;Orthotic Fit/Training    PT Home Exercise Plan    Consulted and Agree with Plan of Care Patient             Patient will benefit from skilled therapeutic intervention in order to improve the following deficits and impairments:  Abnormal gait, Pain, Improper body mechanics, Decreased mobility, Decreased activity tolerance, Decreased range of motion, Decreased endurance, Decreased strength, Impaired perceived functional ability, Impaired UE functional use, Impaired flexibility, Decreased safety awareness, Decreased balance  Visit Diagnosis: Muscle weakness (generalized)  Cervicalgia  Chronic right-sided low back pain without sciatica     Problem List Patient Active Problem List   Diagnosis Date Noted   Radiculopathy 08/17/2020   Rash 07/28/2020   Vitamin D deficiency 05/03/2020   Neuropathy 04/06/2020   S/P ECT (electroconvulsive therapy) 06/02/2019   Memory loss due to medical condition 06/02/2019   Intention tremor 06/02/2019   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   Medication-induced  postural tremor 06/02/2019   OSA on CPAP 01/26/2019   Long term use of drug 10/08/2018   Insomnia 10/08/2018   Anxiety 10/08/2018   Tremors of nervous system 10/08/2018   Acne 01/10/2018   Primary hypercoagulable state (HCC) 10/10/2017   Vitamin B12 deficiency 03/18/2017   Diffuse pain 02/22/2017   Morbid obesity (HCC) 06/22/2016   MDD (major depressive disorder), recurrent severe, without psychosis (HCC) 06/14/2016   Intentional drug overdose (HCC)    Diabetes mellitus type 2 in obese (HCC) 06/11/2016   Positive ANA (antinuclear antibody) 04/02/2016   Hyperparathyroidism (HCC) 01/09/2016   Snoring 09/20/2015   Cervical disc disorder with radiculopathy of cervical region 05/12/2015   Hepatic steatosis 04/18/2015   Achilles tendonitis 04/08/2012   PTE (pulmonary thromboembolism) (HCC) 10/10/2011   Affective disorder (HCC) 10/10/2011   AR (allergic rhinitis) 10/10/2011    10/12/2011, PT, DPT 9:28 PM  10/17/20   [facility] [address] [place], [place], [place] Phone: [phone]   Fax:  [phone]  Name: Deborah Soto MRN: [name] Date of Birth: 08-09-1965

## 2020-10-18 DIAGNOSIS — F332 Major depressive disorder, recurrent severe without psychotic features: Secondary | ICD-10-CM | POA: Diagnosis not present

## 2020-10-18 DIAGNOSIS — G5621 Lesion of ulnar nerve, right upper limb: Secondary | ICD-10-CM | POA: Diagnosis not present

## 2020-10-19 ENCOUNTER — Encounter: Payer: Self-pay | Admitting: Physical Therapy

## 2020-10-19 ENCOUNTER — Ambulatory Visit (INDEPENDENT_AMBULATORY_CARE_PROVIDER_SITE_OTHER): Payer: BC Managed Care – PPO | Admitting: Physical Therapy

## 2020-10-19 ENCOUNTER — Other Ambulatory Visit: Payer: Self-pay

## 2020-10-19 DIAGNOSIS — M545 Low back pain, unspecified: Secondary | ICD-10-CM

## 2020-10-19 DIAGNOSIS — M6281 Muscle weakness (generalized): Secondary | ICD-10-CM

## 2020-10-19 DIAGNOSIS — M542 Cervicalgia: Secondary | ICD-10-CM

## 2020-10-19 DIAGNOSIS — G8929 Other chronic pain: Secondary | ICD-10-CM

## 2020-10-19 NOTE — Therapy (Signed)
[facility] [address] [place], [place], [address] Phone: [phone]   Fax:  [phone]  Physical Therapy Treatment  Patient Details  Name: Deborah Soto MRN: [id number] Date of Birth: July 06, 1965 Referring Provider (PT): Jacquiline Doe, Loraine Leriche   Encounter Date: 10/19/2020   PT End of Session - 10/19/20 1953     Visit Number 4    Number of Visits 16    Date for PT Re-Evaluation 11/30/20    Authorization Type BCBS    PT Start Time 1518    PT Stop Time 1558    PT Time Calculation (min) 40 min    Activity Tolerance Patient tolerated treatment well    Behavior During Therapy [facility] for tasks assessed/performed             Past Medical History:  Diagnosis Date   Anemia    Anxiety    Asthma    Depression    Diabetes mellitus type 2 in obese (HCC) 06/11/2016   Dyspnea    Embolism - blood clot January 1997 & January 2017   Also had LLE DVT in 1997   History of kidney stones    Hypertension    Preeclampsia 10/10/2011   1999    Sleep apnea     Past Surgical History:  Procedure Laterality Date   ANTERIOR CERVICAL DECOMPRESSION/DISCECTOMY FUSION 4 LEVELS N/A 08/17/2020   Procedure: ANTERIOR CERVICAL DECOMPRESSION FUSION CERVICAL THREE - CERVICAL FOUR, CERVICAL FOUR- CERVICAL FIVE, CERVICAL FIVE- CERVICAL SIX WITH INSTRUMENTATION AND ALLOGRAFT;  Surgeon: Estill Bamberg, MD;  Location: [facility] OR;  Service: Orthopedics;  Laterality: N/A;   KIDNEY STONE SURGERY      There were no vitals filed for this visit.   Subjective Assessment - 10/19/20 1952     Subjective Pt states she is sore today, allover.    Currently in Pain? Yes    Pain Score 5     Pain Location Neck    Pain Orientation Right;Left    Pain Descriptors / Indicators Aching    Pain Type Chronic pain    Pain Onset More than a month ago    Pain Frequency Intermittent    Pain Score 8    Pain Location Back    Pain Orientation Right    Pain Descriptors / Indicators  Aching    Pain Type Chronic pain;Acute pain    Pain Onset More than a month ago    Pain Frequency Intermittent    Pain Score 6    Pain Location Knee    Pain Orientation Right;Left    Pain Descriptors / Indicators Aching;Sore    Pain Type Chronic pain    Pain Onset More than a month ago    Pain Frequency Intermittent                               [facility] Adult PT Treatment/Exercise - 10/19/20 0001       Ambulation/Gait   Gait Comments Gait: slow speed:  Stairs: slow speed cautious, pain in back and knees.Dynamic balance: Good -  ,  further balance/coordination to be tested as needed.      Exercises   Exercises Neck;Lumbar      Neck Exercises: Theraband   Scapula Retraction --    Rows 20 reps;Green    Shoulder External Rotation 20 reps    Shoulder External Rotation Limitations YTB      Neck Exercises: Standing  Other Standing Exercises shoulder AROM, scaption, x 10      Neck Exercises: Seated   Neck Retraction --    Cervical Rotation --    Other Seated Exercise --      Neck Exercises: Supine   Other Supine Exercise --      Neck Exercises: Stretches   Upper Trapezius Stretch --    Upper Trapezius Stretch Limitations --    Chest Stretch Limitations .      Lumbar Exercises: Stretches   Single Knee to Chest Stretch 2 reps;30 seconds;Right;Left    Lower Trunk Rotation 5 reps;10 seconds    Pelvic Tilt 20 reps    Other Lumbar Stretch Exercise attempted standing R QL stretch, pt does not feel in back.    Other Lumbar Stretch Exercise seated pelvic tilts x 20;      Lumbar Exercises: Seated   Sit to Stand 10 reps   education on form     Lumbar Exercises: Supine   Ab Set 15 reps    AB Set Limitations with breathing    Bent Knee Raise 20 reps      Manual Therapy   Manual Therapy Joint mobilization;Soft tissue mobilization;Passive ROM    Soft tissue mobilization --                    PT Education - 10/19/20 1953     Education Details  HEP updated    Person(s) Educated Patient    Methods Explanation;Demonstration;Tactile cues;Verbal cues;Handout    Comprehension Verbalized understanding;Returned demonstration;Verbal cues required;Tactile cues required;Need further instruction              PT Short Term Goals - 10/13/20 2125       PT SHORT TERM GOAL #1   Title Pt to be independent wtih initial HEP    Time 2    Period Weeks    Status New    Target Date 10/19/20               PT Long Term Goals - 10/13/20 2126       PT LONG TERM GOAL #1   Title Pt to be independent with final HEP    Time 8    Period Weeks    Status New    Target Date 11/30/20      PT LONG TERM GOAL #2   Title Pt to report decreased pain in neck and shoulders to 0-2/10 with ROM and UE activities, to improve ability for ADLS and IADLS.    Time 8    Period Weeks    Status New    Target Date 11/30/20      PT LONG TERM GOAL #3   Title Pt to demo improved grip strength by at least 10 lb bilaterally, to improve ability for lift, carry,IADLS and fine motor tasks    Time 8    Period Weeks    Status New    Target Date 11/30/20      PT LONG TERM GOAL #4   Title Pt to demo improved strength of bil LEs to at least 4+/5 to improve ability for transfers, stairs, and walking.    Time 8    Period Weeks    Status New    Target Date 11/30/20      PT LONG TERM GOAL #5   Title Pt to demo dynamic balance to be WNL (for pt age) with gait and stairs, to improve ability for community and functional activity,  and to decrease risk for falls.    Time 8    Period Weeks    Status New    Target Date 11/30/20                   Plan - 10/19/20 1954     Clinical Impression Statement Pt with improved ability for sit to stand after education and practice today. Progressed ther ex for core activation, and continued attempts for stretching for R sided back pain. Pt to benefit from continued strengthening for UE, LE, core, and pain relief for  neck and back.    Personal Factors and Comorbidities Comorbidity 1;Fitness;Time since onset of injury/illness/exacerbation    Examination-Activity Limitations Lift;Stand;Bend;Reach Overhead;Caring for Others;Carry;Squat;Stairs    Examination-Participation Restrictions Cleaning;Meal Prep;Yard Work;Community Activity;Shop;Laundry;Driving    Stability/Clinical Decision Making Evolving/Moderate complexity    Rehab Potential Good    PT Frequency 2x / week    PT Duration 8 weeks    PT Treatment/Interventions ADLs/Self Care Home Management;Cryotherapy;Electrical Stimulation;DME Instruction;Ultrasound;Iontophoresis 4mg /ml Dexamethasone;Moist Heat;Traction;Gait training;Stair training;Functional mobility training;Therapeutic activities;Therapeutic exercise;Balance training;Neuromuscular re-education;Patient/family education;Manual techniques;Passive range of motion;Dry needling;Taping;Joint Manipulations;Spinal Manipulations;Energy conservation;Vasopneumatic Device;Orthotic Fit/Training    PT Home Exercise Plan    Consulted and Agree with Plan of Care Patient             Patient will benefit from skilled therapeutic intervention in order to improve the following deficits and impairments:  Abnormal gait, Pain, Improper body mechanics, Decreased mobility, Decreased activity tolerance, Decreased range of motion, Decreased endurance, Decreased strength, Impaired perceived functional ability, Impaired UE functional use, Impaired flexibility, Decreased safety awareness, Decreased balance  Visit Diagnosis: Muscle weakness (generalized)  Cervicalgia  Chronic right-sided low back pain without sciatica     Problem List Patient Active Problem List   Diagnosis Date Noted   Radiculopathy 08/17/2020   Rash 07/28/2020   Vitamin D deficiency 05/03/2020   Neuropathy 04/06/2020   S/P ECT (electroconvulsive therapy) 06/02/2019   Memory loss due to medical condition 06/02/2019   Intention tremor  06/02/2019   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   OSA on CPAP 01/26/2019   Long term use of drug 10/08/2018   Insomnia 10/08/2018   Anxiety 10/08/2018   Tremors of nervous system 10/08/2018   Acne 01/10/2018   Primary hypercoagulable state (HCC) 10/10/2017   Vitamin B12 deficiency 03/18/2017   Diffuse pain 02/22/2017   Morbid obesity (HCC) 06/22/2016   MDD (major depressive disorder), recurrent severe, 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 [place], [place], [place] Phone: [phone]   Fax:  [phone]  Name: Deborah Soto MRN: [name] Date of Birth: [birth year]

## 2020-10-20 DIAGNOSIS — F332 Major depressive disorder, recurrent severe without psychotic features: Secondary | ICD-10-CM | POA: Diagnosis not present

## 2020-10-24 ENCOUNTER — Other Ambulatory Visit: Payer: Self-pay

## 2020-10-24 ENCOUNTER — Ambulatory Visit (INDEPENDENT_AMBULATORY_CARE_PROVIDER_SITE_OTHER): Payer: BC Managed Care – PPO | Admitting: Physical Therapy

## 2020-10-24 DIAGNOSIS — M6281 Muscle weakness (generalized): Secondary | ICD-10-CM

## 2020-10-24 DIAGNOSIS — M542 Cervicalgia: Secondary | ICD-10-CM | POA: Diagnosis not present

## 2020-10-24 DIAGNOSIS — M545 Low back pain, unspecified: Secondary | ICD-10-CM | POA: Diagnosis not present

## 2020-10-24 DIAGNOSIS — G8929 Other chronic pain: Secondary | ICD-10-CM | POA: Diagnosis not present

## 2020-10-24 NOTE — Patient Instructions (Signed)
Access Code: [email or web address] URL: [email or web address] Date: 10/24/2020 Prepared by: Raynelle Fanning  Exercises Seated Shoulder Rolls - 2 x daily - 1 sets - 10 reps Seated Scapular Retraction - 2 x daily - 1 sets - 10 reps Seated Gripping Towel - 2 x daily - 1 sets - 10 reps - 3 hold Doorway Pec Stretch at 60 Elevation - 2 x daily - 3 reps - 30 hold Seated Cervical Sidebending Stretch - 2 x daily - 3 reps - 30 hold Seated Pelvic Tilt - 1 x daily - 2 sets - 10 reps Supine Pectoralis Stretch - 2 x daily - 3 reps - 30 hold Standing Row with Anchored Resistance - 1 x daily - 1 sets - 10 reps Standing Shoulder External Rotation with Resistance - 1 x daily - 2 sets - 10 reps Supine Lower Trunk Rotation - 2 x daily - 10 reps - 5 hold Supine Transversus Abdominis Bracing - Hands on Ground - 1 x daily - 2 sets - 10 reps Supine March - 1 x daily - 2 sets - 10 reps QL Stretch Supine - 1 x daily - 7 x weekly - 1 sets - 2 reps - 60 sec hold

## 2020-10-24 NOTE — Therapy (Signed)
[facility] [address] [place], [place], [address] Phone: [phone]   Fax:  [phone]  Physical Therapy Treatment  Patient Details  Name: Deborah Soto MRN: [id number] Date of Birth: Jun 02, 1965 Referring Provider (PT): Jacquiline Doe, Loraine Leriche   Encounter Date: 10/24/2020   PT End of Session - 10/24/20 1108     Visit Number 5    Number of Visits 16    Date for PT Re-Evaluation 11/30/20    Authorization Type [employer]    PT Start Time 1108    PT Stop Time 1201    PT Time Calculation (min) 53 min             Past Medical History:  Diagnosis Date   Anemia    Anxiety    Asthma    Depression    Diabetes mellitus type 2 in obese (HCC) 06/11/2016   Dyspnea    Embolism - blood clot January 1997 & January 2017   Also had LLE DVT in 1997   History of kidney stones    Hypertension    Preeclampsia 10/10/2011   1999    Sleep apnea     Past Surgical History:  Procedure Laterality Date   ANTERIOR CERVICAL DECOMPRESSION/DISCECTOMY FUSION 4 LEVELS N/A 08/17/2020   Procedure: ANTERIOR CERVICAL DECOMPRESSION FUSION CERVICAL THREE - CERVICAL FOUR, CERVICAL FOUR- CERVICAL FIVE, CERVICAL FIVE- CERVICAL SIX WITH INSTRUMENTATION AND ALLOGRAFT;  Surgeon: Estill Bamberg, MD;  Location: [facility] OR;  Service: Orthopedics;  Laterality: N/A;   KIDNEY STONE SURGERY      There were no vitals filed for this visit.   Subjective Assessment - 10/24/20 1110     Subjective I've been in a lot of pain over the weekend.    Pertinent History ACDF 6/8, c3-6; DM, Fibromyalgia, Pain in multiple locations,    Limitations Sitting;Lifting;Standing;Walking;House hold activities    Patient Stated Goals decreased pain in neck, back, increased strength in legs, arms, and improved abilityfor functional activity.    Currently in Pain? Yes    Pain Score 3     Pain Location Neck    Pain Orientation Right    Pain Descriptors / Indicators Aching    Pain Score 3    Pain  Location Back    Pain Orientation Right    Pain Descriptors / Indicators Aching    Pain Score 4    Pain Location Knee    Pain Orientation Right;Left    Pain Descriptors / Indicators Aching    Aggravating Factors  only when standing                               [facility] Adult PT Treatment/Exercise - 10/24/20 0001       Self-Care   Self-Care Other Self-Care Comments    Other Self-Care Comments  self MFR with ball to parascapular and paraspinal muscles      Neck Exercises: Theraband   Rows 20 reps;Green    Shoulder External Rotation Limitations did with RTB on noodle    Horizontal ABduction 10 reps    Horizontal ABduction Limitations GTB      Neck Exercises: Standing   Other Standing Exercises on noodle along spine: OH flex/scap x 10 ea; ER x 10, then with RTB x 10, goal post retraction x 10;      Neck Exercises: Seated   Cervical Isometrics Right lateral flexion;Left lateral flexion;10 secs;5 reps    Neck Retraction  10 reps    Cervical Rotation Both;5 reps    Other Seated Exercise seated neck diagonals x 10 ea      Lumbar Exercises: Stretches   Other Lumbar Stretch Exercise standing warrior stretch with SB and then with wide feet for better balance 2x15 sec bil (advised longer stretch for home)    Other Lumbar Stretch Exercise seated pelvic tilts x 10;      Manual Therapy   Manual Therapy Myofascial release;Soft tissue mobilization    Soft tissue mobilization to right thoracic and lumbar paraspinals and with IASTM (ball)    Myofascial Release Rt QL release                    PT Education - 10/24/20 1210     Education Details PNE and guided imagery; TENS electrode placement;  HEP progressed    Person(s) Educated Patient    Methods Explanation;Demonstration;Handout    Comprehension Returned demonstration;Verbalized understanding              PT Short Term Goals - 10/13/20 2125       PT SHORT TERM GOAL #1   Title Pt to be independent  wtih initial HEP    Time 2    Period Weeks    Status New    Target Date 10/19/20               PT Long Term Goals - 10/13/20 2126       PT LONG TERM GOAL #1   Title Pt to be independent with final HEP    Time 8    Period Weeks    Status New    Target Date 11/30/20      PT LONG TERM GOAL #2   Title Pt to report decreased pain in neck and shoulders to 0-2/10 with ROM and UE activities, to improve ability for ADLS and IADLS.    Time 8    Period Weeks    Status New    Target Date 11/30/20      PT LONG TERM GOAL #3   Title Pt to demo improved grip strength by at least 10 lb bilaterally, to improve ability for lift, carry,IADLS and fine motor tasks    Time 8    Period Weeks    Status New    Target Date 11/30/20      PT LONG TERM GOAL #4   Title Pt to demo improved strength of bil LEs to at least 4+/5 to improve ability for transfers, stairs, and walking.    Time 8    Period Weeks    Status New    Target Date 11/30/20      PT LONG TERM GOAL #5   Title Pt to demo dynamic balance to be WNL (for pt age) with gait and stairs, to improve ability for community and functional activity, and to decrease risk for falls.    Time 8    Period Weeks    Status New    Target Date 11/30/20                   Plan - 10/24/20 1215     Clinical Impression Statement Patient reporting increased pain over the weekend and today, however pain scale rating was less. Basic pain neuroscience education taught and use of guided imagery advised. Progressed some TE with increased resistance. Patient able to tolerate QL stretching today, but back spasms if done too quickly. Advised long holds for  home. She will benefit from continued strengthening and flexibility for UE, LE and core.    PT Frequency 2x / week    PT Duration 8 weeks    PT Treatment/Interventions ADLs/Self Care Home Management;Cryotherapy;Electrical Stimulation;DME Instruction;Ultrasound;Iontophoresis 4mg /ml  Dexamethasone;Moist Heat;Traction;Gait training;Stair training;Functional mobility training;Therapeutic activities;Therapeutic exercise;Balance training;Neuromuscular re-education;Patient/family education;Manual techniques;Passive range of motion;Dry needling;Taping;Joint Manipulations;Spinal Manipulations;Energy conservation;Vasopneumatic Device;Orthotic Fit/Training    PT Next Visit Plan assess response to PNE, QL stretch    PT Home Exercise Plan    Consulted and Agree with Plan of Care Patient             Patient will benefit from skilled therapeutic intervention in order to improve the following deficits and impairments:  Abnormal gait, Pain, Improper body mechanics, Decreased mobility, Decreased activity tolerance, Decreased range of motion, Decreased endurance, Decreased strength, Impaired perceived functional ability, Impaired UE functional use, Impaired flexibility, Decreased safety awareness, Decreased balance  Visit Diagnosis: Cervicalgia  Muscle weakness (generalized)  Chronic right-sided low back pain without sciatica     Problem List Patient Active Problem List   Diagnosis Date Noted   Radiculopathy 08/17/2020   Rash 07/28/2020   Vitamin D deficiency 05/03/2020   Neuropathy 04/06/2020   S/P ECT (electroconvulsive therapy) 06/02/2019   Memory loss due to medical condition 06/02/2019   Intention tremor 06/02/2019   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   OSA on CPAP 01/26/2019   Long term use of drug 10/08/2018   Insomnia 10/08/2018   Anxiety 10/08/2018   Tremors of nervous system 10/08/2018   Acne 01/10/2018   Primary hypercoagulable state (HCC) 10/10/2017   Vitamin B12 deficiency 03/18/2017   Diffuse pain 02/22/2017   Morbid obesity (HCC) 06/22/2016   MDD (major depressive disorder), recurrent severe, 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 [place], [place], [place] Phone: [phone]   Fax:  [phone]  Name: Deborah Soto MRN: [name] Date of Birth: 1965/04/19

## 2020-10-25 ENCOUNTER — Ambulatory Visit: Payer: BC Managed Care – PPO | Admitting: Psychiatry

## 2020-10-25 DIAGNOSIS — F332 Major depressive disorder, recurrent severe without psychotic features: Secondary | ICD-10-CM | POA: Diagnosis not present

## 2020-10-26 ENCOUNTER — Encounter: Payer: BC Managed Care – PPO | Admitting: Physical Therapy

## 2020-11-01 DIAGNOSIS — F332 Major depressive disorder, recurrent severe without psychotic features: Secondary | ICD-10-CM | POA: Diagnosis not present

## 2020-11-02 ENCOUNTER — Other Ambulatory Visit: Payer: Self-pay | Admitting: Psychiatry

## 2020-11-02 DIAGNOSIS — F332 Major depressive disorder, recurrent severe without psychotic features: Secondary | ICD-10-CM

## 2020-11-02 NOTE — Telephone Encounter (Signed)
She has apt tomorrow 8/25

## 2020-11-03 ENCOUNTER — Other Ambulatory Visit: Payer: Self-pay

## 2020-11-03 ENCOUNTER — Encounter: Payer: Self-pay | Admitting: Psychiatry

## 2020-11-03 ENCOUNTER — Other Ambulatory Visit: Payer: Self-pay | Admitting: Family Medicine

## 2020-11-03 ENCOUNTER — Ambulatory Visit (INDEPENDENT_AMBULATORY_CARE_PROVIDER_SITE_OTHER): Payer: BC Managed Care – PPO | Admitting: Psychiatry

## 2020-11-03 DIAGNOSIS — G4733 Obstructive sleep apnea (adult) (pediatric): Secondary | ICD-10-CM | POA: Diagnosis not present

## 2020-11-03 DIAGNOSIS — R52 Pain, unspecified: Secondary | ICD-10-CM

## 2020-11-03 DIAGNOSIS — F411 Generalized anxiety disorder: Secondary | ICD-10-CM

## 2020-11-03 DIAGNOSIS — F332 Major depressive disorder, recurrent severe without psychotic features: Secondary | ICD-10-CM

## 2020-11-03 DIAGNOSIS — G4710 Hypersomnia, unspecified: Secondary | ICD-10-CM

## 2020-11-03 NOTE — Progress Notes (Signed)
Deborah Soto [id number] [birth year] 55 y.o.     Subjective:   Patient ID:  Deborah Soto is a 55 y.o. (DOB [birth year]) female.  Chief Complaint:  Chief Complaint  Patient presents with   Follow-up   Depression   Anxiety   Depression        Associated symptoms include fatigue and myalgias.  Associated symptoms include no decreased concentration.   Deborah Soto presents to the office today for follow-up of TRD and anxiety, and insomnia.  seen December 08, 2018.  She was complaining of lithium tremor and some cognitive problems.  Hydroxyzine was stopped.  Propranolol increased to 60 mg twice daily for tremor.  Lithium level and amitriptyline levels were requested. Amitriptyline level to 37, nortriptyline 49 on amitriptyline 250 mg nightly. Lithium level 0.800 mg daily  Phone call on October 6 to discuss lab results as follows: Note   ----- Message from [name]., MD sent at 12/15/2018 10:08 AM EDT ----- Lithium level stable at 0.8 in a good range.  The previous was 0.7.  She is having some tremor issues.   Normal BMP including excellent creatinine and calcium levels.  Blood sugar is high but she is aware of that problem.   TSH is within normal limits.   Serum amitriptyline level is 286 which is at the upper end of the normal range and suggestive that we not try further increase.  We will discuss further options at her follow-up appointment.   No medication adjustments required, but because of her tremor and her lithium level is slightly higher than it was with the prior level if she wants to reduce the lithium from 3 of the 300 mg tablets daily to 2-1/2 of the 300 mg tablets daily she can do so.  At her last appointment she was also encouraged to try a higher dose of propranolol and that may have resolved the problem.  If it did do not reduce the lithium but if it did not and she wants to reduce the dose she can do so as noted.  She should call us if she has any  recurrence of symptoms.   [name], [name]     She had repeat lithium level at 750 mg daily of 0.  7 with some improvement in tremor.  In phone call on November 20 she reported mood was worse with weepiness, overreacting, "pity party".  Because mood symptoms were worse with the reduction lithium she was encouraged to increase the dosage back to 900 mg daily.   Last seen November 53, 2020.  The following changes were made: Increase propranolol to 80 mg twice daily for lithium tremor and anxiety her blood pressure and pulse appear high enough that she can tolerate this dosage.  Disc Se.  Disc risk with low BP and DM.  He has not been having any problems with low blood sugar. Amitriptyline 6 or 300 mg daily if tolerated.  Try to take some about 4 -5 hours before sleep Try to take lithium also 4-5 hours before sleep to minimize daytime tremor Add methylphenidate ER 27 mg in the morning.  If no response we will increase the dosage.  Seen May 08, 2019 with her husband. Aside from physically feeling like she can't do much then having memory issues.  Forgetful.  Loses track of thoughts between tasks.  Most embarrassing being around people and word-finding issues.   Problems with walking bc balance and weakness.  Fears falling. Only fall at  Xmas morning.  Tremor is awful.  Affects keying.   H notices cognitive problems and forgetfulness.    Shaking got really bad and got lithium level as low as 600 mg but the next day felt really helpless and everything seemed to be aimed at hurting me and felt disrespected.  Problems with boss and 55 yo taking 5 AP classes and 2 volleyball classes.  I've been on her to accomplish all these things.  She claimed that pt screaming at her.   H CO she was more irritable after the reduction in lithium.   H CO her anxiety also seems worse.  H says the last year has been unhealthy with 12 hour work days for her.   Doesn't go out much.    Attention problems more  noticeable.  Still dropping and tremor.  Tolerated the increase in propranolol.    CO shakiness from lithium which interferes with typing. Also a good bit of anxiety generally without panic.  BS are high.  NotTaking hydroxyzine.   sleeping better and still using CPAP.  Using CPAP regularly. Less awakening than before.  No SE. depression was worse after reducing the lithium.  Irritability was also worse after reducing the lithium to 600 mg daily.  seen May 08, 2019.  Multiple changes made:  Reduce propranolol to 60 mg twice daily for lithium tremor and anxiety since the increase was not helpful.   Restart B6 for lithium tremor. 500 mg BID. She forgot and doesn't want more. Try to take lithium also 4-5 hours before sleep to minimize daytime tremor. depression was worse after reducing the lithium.  Irritability was also worse after reducing the lithium to 600 mg daily. So reluctant to reduce it further.  It is unclear how to address the benefits of lithium without using lithium other than to consider Spravato  Increase methylphenidate ER 36 mg in the morning. Increase methylphenidate ER 36 mg in the morning.  Wait 1-2 weeks then reduce amitriptyline to 5 tablets nightly to see if cognition is better. Also saw Dr. [name] re: sleep disorder.  05/2019 appt with the following noted: Lost Rx of Concerta 36 so didn't take it long.  Out of it.  Still shakey and cognitive problems.  Dep 5/10/  Anxiety 6/10 worse at night.  No SI.  8 hours sleep.  Mild panic couple times daily. Frustrated with tremor and sleepiness which is worse than tiredness.   History of low vitamin D taking 150K/week but stopped 6 mos ago. Plan : Modafinil 100 mg tablet 1 dailly for 1 week, Then add Concerta 36 mg 1 each AM.  Option increase modafinil mid April  Restart B6 for lithium tremor. 500 mg BID. Get lab test at earliest convience  10/14/2019 appointment with the following noted: Has been on amitriptyline 250 mg for  mos.  Tried a week ago reducing to 200 and felt worse so back on 250. Weaned off Lyrica about a month ago.  Not much change off it.   Not sure if it helped energy or alertness. Saw Dr. Cardell Peach in consultation. Mental clarity is a lot better with stimulants and not sleeping as much in daytime with current meds.  Severe drowsiness is a lot better.   Off metformin and added Glipizide.   Not great with depression 5/10.  Anxiety can trigger problems with thinking and somatic sx   I feel like it's something physical and can keep her up at night. Occ racing heart or feeling hot.  Caffeine variable.  Plan:Plan: Increase methylphenidate from 36 to 54 mg in the morning. Increase amitriptyline to 6 tablets in the evening    11/12/19 TC noting call: Pt called stated she's not feeling well at all. Ask for sooner apt than 10/7. Depression, anxiety & focus is much worse. Has not planned to hurt herself but thoughts of things would be better if I was not here have crossed her mind often. Contact ASAP @ [phone].  I put Pt on canc list  MD response: I reviewed the meds and previous med lists.  The next options are either Spravato or a major med change from amitriptyline which is not something I can do outside of an appt.  She's on cancellation list and we'll try to work her in sooner.  Nothing I can change right now except since the increase in amitriptyline has not helped, she can reduce it to 200 mg nightly in preparation for other changes to come. Nursing response after talking with pt: Discussed symptoms and medications with patient, she did not increase her Amitriptyline 50 mg up to 6 tablets so she will start that today. Said you may have told her that but she didn't remember. She has read about Spravato but she is not wanting to proceed with that. Informed her she's on cancellation list as well. She will call back with worsening signs or symptoms.  11/23/19 appt with the following noted: Forgot to increase  amitriptyline until noted. Tolerating it OK but hasn't had time to help. More alert with Concerta increase to 54 mg daily also with modafinil.  A little more focused and better productivity.  Still forgetful.   No SE noted except GI issues from diarrhea to constipation.   Still miserable and cry easily.  Try to numb herself.  Always tired especially emotionally.  Anxiety is high and predates current stimulants. No therapy since [facility].  She felt he pushed too hard for her to make changes.  I don't feel like I can deal with him right now. Plan: Continue meds & The increase in amitriptyline to 300 mg daily needs more time.  01/12/2020 appointment with the following noted: Not well.  Mood horrible.  Easily frustrated sometimes without reason and may nap or cry.  Anxiety is pretty bad.  Daytime sleepiness seems worse.  Can get nausea from anxiety.  Feels like function is slow.  Have to write everything down.  Hates home, job, high school volleyball program.  At some point I have to take responsibility.   Amitriptyline also not helping pain any.  Clenching jaw for 3 weeks. Likcking lips.  Picking fingers. D will play volleyball at [employer] and [place]. Plan: Reduce amitriptyline by 1 tablet every 4 days. Start Viibryd 10 mg daily with food for 7 days, then 20 mg daily for 7 days, then increase to 40 mg daily.  03/18/20 appt urgently made and noted:  Seen with husband. Still with jaw clenching day and night despite off stimulant. Now on Viibryd 60 mg since 03/02/20 and no stimulant., lithium 900 mg, and propranolol 80 BID. Rare hydroxyzine DT sleepiness, Gabapentin 300 TID. Not taking it with food.  LT sensitivity to noise and light and overstimulated and now bothering her more at work.  Always had to use shirts without tags and sensitivity to smells. Easily agitated in public per H.  Gets negative and wants to give up and not live anymore.  She says there's less chance of acting on it if she tells  H about it and she's doing so now.  Pain worse off amitriptyline esp around ribs and general diffuse pain.  Anxiety worse with stimulants and hasn't gone away.  Trouble staying asleep. Plan: Need to take Viibryd 60 with food emphasized.  She's not currently doing so. Restart low-dose amitriptyline for pain  04/22/2020 appointment with following noted: Only restarted amitriptyline 50 mg last week.  Stays asleep better with it.  Sleep 8-9 hours now but no change in pain yet.  Sleepier in day recently. Done a lot of travel lately. Maybe viibryd making a little difference with mood and anxiety now that has been on it longer.  Needs to take Viibryd in AM bc seems to interfere with sleep. Final season of travel volleyball with good team. H notes still some irritability. Pain worse off amitriptyline.  Can reduce to  amitriptyline 25 for pain if 50 mg remains too sedating.  Is sleeping better.      06/21/2020 appointment with following noted:  Seen with H Tolerating amitriptyline 50 mg HS with decent sleep. Really crappy and as bad as 10 years.  Situationally life sucks.  Work environment is bad with coworkers.  Since Sept work is bad.  Doesn't feel people are supportive at home or work. H working 12 hour days.  When she's off wants to sleep all day.   Still in therapy with Thayer Ohm.  Is good therapist. 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  Check lithium level Referred to Dr. Evelene Croon for [facility]  11/03/2020 appointment with the following noted: 55 yo D got Covid but is OK. Still doing Spravato 84 mg twice weekly and plans to continue for awhile. And for a total of 3 mos.  Tolerated well.  It's a nice feeling.   Didn't seem to help the depression for a couple of months.   It does feel different. SI has resolved lately which is particularly impressive given she  has a lot of free time. She has lot s of time with nothing to do bc quit her job. Job environment got worse and worse.  Had been there 9 years.   Doing PT for pain in her back.  Ongoing physical issues.   Mood clearly better with Spravato but not resolved.   Neck fusion recently.  Past Psychiatric Medication Trials: Failed ECT and TMS,  amitriptyline helped for 2 to 3 years in 1987,   2021 No benefit from amitriptyline 300 mg with adequate duration.  Nortriptyline 200 with a therapeutic blood level. Trintellix, Emsam, duloxetine 120, Lexapro 20, Fetzima,  Wellbutrin, fluoxetine, paroxetine, sertraline,  Viibryd 60 lithium SE,  Latuda 120, Abilify 15,  Vraylar, Rexulti,Seroquel 400, risperidone, Geodon, Haldol for agitation, lamotrigine 300,  buspirone, clonazepam, gabapentin, modafinil, pramipexole,  Nuvigil, Vyvanse, Concerta SHE DOESN'T WANT STIMULANTS AGAIN bc of anxiety. Trazodone, propranolol history of overdose on Xanax, There is a history of suicide attempts and psychiatric hospitalizations.  Review of Systems:  Review of Systems  Constitutional:  Positive for fatigue.  Cardiovascular:  Negative for chest pain and palpitations.  Gastrointestinal:  Negative for diarrhea.  Musculoskeletal:  Positive for back pain and myalgias. Negative for gait problem.  Neurological:  Positive for tremors and weakness. Negative for dizziness.  Psychiatric/Behavioral:  Negative for confusion, decreased concentration, hallucinations and self-injury. The patient is nervous/anxious.    Medications: I have reviewed the patient's current medications.  Current Outpatient Medications  Medication Sig Dispense Refill   amitriptyline (ELAVIL) 50 MG tablet Take 1 tablet (50 mg total) by mouth at bedtime. 90 tablet 0   ampicillin (PRINCIPEN) 500 MG capsule Take 500 mg by mouth daily.     Cholecalciferol (VITAMIN D) 50 MCG (2000 UT) tablet Take 2,000 Units by mouth daily.     Continuous Blood Gluc  Receiver (FREESTYLE LIBRE 2 READER) DEVI E11.9 1 each 0   Continuous Blood Gluc Sensor (FREESTYLE LIBRE 2 SENSOR) MISC E11.9 14 each 0   cyanocobalamin (,VITAMIN B-12,) 1000 MCG/ML injection One Injection weekly for 4 weeks then one injection monthly for 3 month 7 mL 0   Empagliflozin-metFORMIN HCl ER (SYNJARDY XR) 25-1000 MG TB24 Take 1 tablet by mouth daily. 30 tablet 5   Esketamine HCl, 84 MG Dose, (SPRAVATO, 84 MG DOSE,) 28 MG/DEVICE SOPK Place 84 mg into the nose See admin instructions. Use twice weekly for 4 weeks then reduce to once weekly     gabapentin (NEURONTIN) 300 MG capsule Take 2 capsules in the morning, 1 in the middle of the day, and 2 at night. 150 capsule 3   lithium carbonate (LITHOBID) 300 MG CR tablet TAKE 3 TABLETS ORALLY AT BEDTIME. 90 tablet 0   methocarbamol (ROBAXIN) 500 MG tablet Take 1 tablet (500 mg total) by mouth every 6 (six) hours as needed for muscle spasms. 30 tablet 2   Multiple Vitamin (MULTIVITAMIN WITH MINERALS) TABS tablet Take 1 tablet by mouth daily.     OLANZapine (ZYPREXA) 10 MG tablet TAKE ONE TABLET BY MOUTH AT BEDTIME 30 tablet 2   pantoprazole (PROTONIX) 40 MG tablet TAKE 1 TABLET ONCE DAILY. 30 tablet 0   propranolol (INDERAL) 40 MG tablet take ONE TO TWO tablets every EIGHT hours FOR anxiety 150 tablet 0   rivaroxaban (XARELTO) 20 MG TABS tablet Take 1 tablet (20 mg total) by mouth daily with supper. 30 tablet 5   tirzepatide (MOUNJARO) 15 MG/0.5ML Pen Inject 15 mg into the skin once a week. 6 mL 1   TRESIBA FLEXTOUCH 100 UNIT/ML FlexTouch Pen Inject 80 Units into the skin at bedtime. 24 mL 2   tretinoin (RETIN-A) 0.1 % cream Apply 1 application topically at bedtime.     VIIBRYD 40 MG TABS TAKE 1 & 1/2 TABLETS BY MOUTH DAILY. 45 tablet 1   No current facility-administered medications for this visit.    Medication Side Effects: Sedation  Allergies:  Allergies  Allergen Reactions   Tramadol Other (See Comments)    Tingling all over     Hydrocodone     Ineffective     Past Medical History:  Diagnosis Date   Anemia    Anxiety    Asthma    Depression    Diabetes mellitus type 2 in obese (HCC) 06/11/2016   Dyspnea    Embolism - blood clot January 1997 & January 2017   Also had LLE DVT in 1997   History of kidney stones    Hypertension    Preeclampsia 10/10/2011   1999    Sleep apnea     Family History  Problem Relation Age of Onset   Dementia Father    Heart disease Father    Clotting disorder Mother    Arthritis Mother    Clotting disorder Sister    Clotting disorder Maternal Grandmother    Clotting disorder Maternal Aunt    Rheumatologic disease Neg Hx    Hyperparathyroidism Neg Hx  Social History   Socioeconomic History   Marital status: Married    Spouse name: Not on file   Number of children: Y   Years of education: Not on file   Highest education level: Not on file  Occupational History   Occupation: admin assist    Comment: insurance verification  Tobacco Use   Smoking status: Former    Packs/day: 1.00    Years: 20.00    Pack years: 20.00    Types: Cigarettes    Quit date: 03/12/2004    Years since quitting: 16.6   Smokeless tobacco: Never  Vaping Use   Vaping Use: Never used  Substance and Sexual Activity   Alcohol use: Yes    Alcohol/week: 1.0 standard drink    Types: 1 Cans of beer per week    Comment: 1-2 times a week   Drug use: No   Sexual activity: Yes    Partners: Male    Birth control/protection: None  Other Topics Concern   Not on file  Social History Narrative   Originally from [place]. Always lived in [place]. 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 No international travel recently. Previously has been to [place] in May 1996. No mold exposure recently. No bird exposure. Does have multiple pets.    Social Determinants of Health   Financial Resource Strain: Not on file  Food Insecurity: Not on file  Transportation Needs: Not on file  Physical Activity: Not on  file  Stress: Not on file  Social Connections: Not on file  Intimate Partner Violence: Not on file    Past Medical History, Surgical history, Social history, and Family history were reviewed and updated as appropriate.   Please see review of systems for further details on the patient's review from today.   Objective:   Physical Exam:  LMP  (LMP Unknown) Comment: last period ---  1 year ago (?)  Physical Exam Constitutional:      General: She is not in acute distress. Musculoskeletal:        General: No deformity.  Neurological:     Mental Status: She is alert and oriented to person, place, and time.     Cranial Nerves: No dysarthria.     Coordination: Coordination normal.  Psychiatric:        Attention and Perception: Attention and perception normal. She does not perceive auditory or visual hallucinations.        Mood and Affect: Mood is anxious and depressed. Affect is not labile, blunt, angry, tearful or inappropriate.        Speech: Speech normal.        Behavior: Behavior normal. Behavior is cooperative.        Thought Content: Thought content normal. Thought content is not paranoid or delusional. Thought content does not include homicidal or suicidal ideation. Thought content does not include homicidal or suicidal plan.        Cognition and Memory: Cognition and memory normal.        Judgment: Judgment normal.     Comments: Insight intact Depression no better    Lab Review:     Component Value Date/Time   NA 141 09/22/2020 1233   NA 138 08/21/2019 0000   NA 140 12/11/2016 1236   K 4.0 09/22/2020 1233   K 4.2 12/11/2016 1236   CL 104 09/22/2020 1233   CO2 22 09/22/2020 1233   CO2 24 12/11/2016 1236   GLUCOSE 215 (H) 09/22/2020 1233   GLUCOSE 109  12/11/2016 1236   BUN 11 09/22/2020 1233   BUN 12 08/21/2019 0000   BUN 15.1 12/11/2016 1236   CREATININE 0.61 09/22/2020 1233   CREATININE 0.68 02/22/2020 1649   CREATININE 0.8 12/11/2016 1236   CALCIUM 9.9  09/22/2020 1233   CALCIUM 10.0 12/11/2016 1236   PROT 7.1 09/22/2020 1233   PROT 7.5 12/11/2016 1236   ALBUMIN 4.6 09/22/2020 1233   ALBUMIN 4.4 12/11/2016 1236   AST 34 09/22/2020 1233   AST 38 (H) 12/11/2016 1236   ALT 46 (H) 09/22/2020 1233   ALT 61 (H) 12/11/2016 1236   ALKPHOS 84 09/22/2020 1233   ALKPHOS 71 12/11/2016 1236   BILITOT 0.4 09/22/2020 1233   BILITOT 0.62 12/11/2016 1236   GFRNONAA >60 08/16/2020 1157   GFRNONAA 105 12/11/2018 1121   GFRAA 122 12/11/2018 1121       Component Value Date/Time   WBC 9.0 09/22/2020 1233   RBC 4.78 09/22/2020 1233   HGB 14.0 09/22/2020 1233   HGB 14.5 12/11/2016 1236   HCT 42.0 09/22/2020 1233   HCT 43.9 12/11/2016 1236   PLT 297.0 09/22/2020 1233   PLT 289 12/11/2016 1236   PLT 295 07/24/2016 1848   MCV 87.9 09/22/2020 1233   MCV 93.2 12/11/2016 1236   MCH 29.9 08/16/2020 1157   MCHC 33.3 09/22/2020 1233   RDW 15.2 09/22/2020 1233   RDW 13.5 12/11/2016 1236   LYMPHSABS 2.6 08/16/2020 1157   LYMPHSABS 2.4 12/11/2016 1236   MONOABS 0.7 08/16/2020 1157   MONOABS 0.7 12/11/2016 1236   EOSABS 0.0 08/16/2020 1157   EOSABS 0.7 (H) 12/11/2016 1236   BASOSABS 0.0 08/16/2020 1157   BASOSABS 0.0 12/11/2016 1236    Lithium Lvl  Date Value Ref Range Status  12/11/2018 0.8 0.6 - 1.2 mmol/L Final    Serum nortriptyline level on 150 mg a day was 109  Amitriptyline  Level 12/11/2018 =237  On 250 mg daily,.    No results found for: PHENYTOIN, PHENOBARB, VALPROATE, CBMZ   .res Assessment: Plan:    Severe recurrent major depression without psychotic features (HCC) - Plan: Lithium level  Generalized anxiety disorder  Hypersomnolence  Obstructive sleep apnea  Diffuse pain   Deborah Soto has chronic severe major depression and generalized anxiety that is treatment resistant and failed multiple medications ECT and TMS as noted above including SSRIs, try cyclic's, lithium, and atypical antipsychotics for depression.. She has had  partial benefit from amitriptyline plus lithium.  However she is having tremor problems from the lithium.  Efforts to reduce the lithium have led to worsening psychiatric symptoms.  There has been a discussion about possible bipolar elements but she has no clear manic episodes unrelated to medication changes.  She has features of atypical depression.  So an MAO inhibitor is attractive from that perspective.  Discussed Spravato option in detail.  We will discuss it again at follow-up appointment.  She was given information to read in the interim and the treatment plan was discussed in terms of frequency and side effects.  She is more open to this now.  Extensive discussion about procedure time commitment. Rec go for consult.  Discussed the process in detail of consultation with Dr. Evelene Croon. To go for consultation.  Reduce olanzapine to 5 mg HS  Therefore consideration could be given to other trials of MAO inhibitors as they are classically more effective than other antidepressants and atypical depression.  She did fail a trial of Emsam.  Consideration could be given  to using tablet version of selegiline and going to much higher doses or the use of Parnate or Nardil.. Other option for TRD not tried is Symbyax but weight gain risk.  Option of adding olanzapine to current antidepressant but she reports she does not check her blood sugar very often and that would not be a good scenario with olanzapine.  Statistically speaking the most potentially beneficial option would be Parnate or Nardil but they are difficult to use   Need to take Viibryd 60 with food emphasized.  She's improving but not consistent.  check lithium level  Rec counseling.  Last did April 2019 after a hospitalization. She's back seeing Kennon Rounds..  Encourage physical activity and weight loss.  She is not particularly motivated and feels that is hard to accomplish because of chronic pain.  Chronic pain complicates her treatment of  depression.  Recent B12 deficiency dx and treatment just started.  Hold B6 for lithium tremor. 500 mg BID. If needed.  Tremor is better.  None currently.  Try to take lithium also 4-5 hours before sleep to minimize daytime tremor. depression was worse after reducing the lithium.  Irritability was also worse after reducing the lithium to 600 mg daily.   Pain worse off amitriptyline.  Better on  amitriptyline 50 mg No longer sedating. Is sleeping better.  No sig risk serotonin syndrome with this low of a dose.  History of low vitamin D recently recurred and started OTC vitamin D.  Follow-up 8 weeks  [name], [name]  Future Appointments  Date Time Provider Department Center  11/04/2020 11:00 AM Sedalia Muta, PT [facility]  11/15/2020  9:15 AM Rodolph Bong, MD [facility] None  12/23/2020  9:40 AM Ardith Dark, MD [facility]    Orders Placed This Encounter  Procedures   Lithium level      -------------------------------

## 2020-11-04 ENCOUNTER — Ambulatory Visit (INDEPENDENT_AMBULATORY_CARE_PROVIDER_SITE_OTHER): Payer: BC Managed Care – PPO | Admitting: Physical Therapy

## 2020-11-04 ENCOUNTER — Encounter: Payer: Self-pay | Admitting: Physical Therapy

## 2020-11-04 DIAGNOSIS — M542 Cervicalgia: Secondary | ICD-10-CM

## 2020-11-04 DIAGNOSIS — M545 Low back pain, unspecified: Secondary | ICD-10-CM | POA: Diagnosis not present

## 2020-11-04 DIAGNOSIS — M6281 Muscle weakness (generalized): Secondary | ICD-10-CM

## 2020-11-04 DIAGNOSIS — G8929 Other chronic pain: Secondary | ICD-10-CM | POA: Diagnosis not present

## 2020-11-04 NOTE — Therapy (Signed)
[facility] [address] [place], [place], [address] Phone: [phone]   Fax:  [phone]  Physical Therapy Treatment  Patient Details  Name: Deborah Soto MRN: [id number] Date of Birth: 1965/06/24 Referring Provider (PT): Jacquiline Doe, Loraine Leriche   Encounter Date: 11/04/2020   PT End of Session - 11/04/20 1407     Visit Number 6    Number of Visits 16    Date for PT Re-Evaluation 11/30/20    Authorization Type BCBS    PT Start Time 1105    PT Stop Time 1145    PT Time Calculation (min) 40 min             Past Medical History:  Diagnosis Date   Anemia    Anxiety    Asthma    Depression    Diabetes mellitus type 2 in obese (HCC) 06/11/2016   Dyspnea    Embolism - blood clot January 1997 & January 2017   Also had LLE DVT in 1997   History of kidney stones    Hypertension    Preeclampsia 10/10/2011   1999    Sleep apnea     Past Surgical History:  Procedure Laterality Date   ANTERIOR CERVICAL DECOMPRESSION/DISCECTOMY FUSION 4 LEVELS N/A 08/17/2020   Procedure: ANTERIOR CERVICAL DECOMPRESSION FUSION CERVICAL THREE - CERVICAL FOUR, CERVICAL FOUR- CERVICAL FIVE, CERVICAL FIVE- CERVICAL SIX WITH INSTRUMENTATION AND ALLOGRAFT;  Surgeon: Estill Bamberg, MD;  Location: [facility] OR;  Service: Orthopedics;  Laterality: N/A;   KIDNEY STONE SURGERY      There were no vitals filed for this visit.   Subjective Assessment - 11/04/20 1405     Subjective Pt states she thinks pain in neck and shoulders is doing a little better, she feels she is moving better. Notes weakness in legs, difficulty gettting up from chair, stairs difficult, and pain in bil low back.    Currently in Pain? Yes    Pain Score 6     Pain Location Neck    Pain Orientation Right    Pain Descriptors / Indicators Aching    Pain Type Chronic pain    Pain Onset More than a month ago    Pain Frequency Intermittent    Pain Score 6    Pain Location Back    Pain  Orientation Right;Left    Pain Descriptors / Indicators Aching;Tightness    Pain Type Chronic pain;Acute pain    Pain Onset More than a month ago    Pain Frequency Intermittent                               [facility] Adult PT Treatment/Exercise - 11/04/20 0001       Lumbar Exercises: Stretches   Single Knee to Chest Stretch 2 reps;30 seconds;Right;Left    Lower Trunk Rotation 5 reps;10 seconds    Pelvic Tilt 20 reps      Lumbar Exercises: Aerobic   Recumbent Bike L1 x 5 min;      Lumbar Exercises: Seated   Sit to Stand 10 reps      Lumbar Exercises: Supine   Clam 20 reps    Clam Limitations GTB    Bent Knee Raise 20 reps    Bent Knee Raise Limitations GTB    Bridge 20 reps    Straight Leg Raise 10 reps    Straight Leg Raises Limitations bil      Manual Therapy  Soft tissue mobilization STM/ DTM to bil lumbar paraspinals and QL                      PT Short Term Goals - 10/13/20 2125       PT SHORT TERM GOAL #1   Title Pt to be independent wtih initial HEP    Time 2    Period Weeks    Status New    Target Date 10/19/20               PT Long Term Goals - 10/13/20 2126       PT LONG TERM GOAL #1   Title Pt to be independent with final HEP    Time 8    Period Weeks    Status New    Target Date 11/30/20      PT LONG TERM GOAL #2   Title Pt to report decreased pain in neck and shoulders to 0-2/10 with ROM and UE activities, to improve ability for ADLS and IADLS.    Time 8    Period Weeks    Status New    Target Date 11/30/20      PT LONG TERM GOAL #3   Title Pt to demo improved grip strength by at least 10 lb bilaterally, to improve ability for lift, carry,IADLS and fine motor tasks    Time 8    Period Weeks    Status New    Target Date 11/30/20      PT LONG TERM GOAL #4   Title Pt to demo improved strength of bil LEs to at least 4+/5 to improve ability for transfers, stairs, and walking.    Time 8    Period Weeks     Status New    Target Date 11/30/20      PT LONG TERM GOAL #5   Title Pt to demo dynamic balance to be WNL (for pt age) with gait and stairs, to improve ability for community and functional activity, and to decrease risk for falls.    Time 8    Period Weeks    Status New    Target Date 11/30/20                   Plan - 11/04/20 1408     Clinical Impression Statement Pt states increased pain in bil QL region than she has had in the past. Reviewed stretching for this, and STM done for muscle tension relief as well. Pt with improving ability for ther ex and activity. Did well with core and hip strengthening today, added to HEP. Discussed importance of HEP for starting more strengthening exercises. Pt seems to be doing well with neck and shoulder mobility and stretches at home. Overall pt seems to be moving better today, with less overall pain. Plan to progress LE strength as able.    PT Frequency 2x / week    PT Duration 8 weeks    PT Treatment/Interventions ADLs/Self Care Home Management;Cryotherapy;Electrical Stimulation;DME Instruction;Ultrasound;Iontophoresis 4mg /ml Dexamethasone;Moist Heat;Traction;Gait training;Stair training;Functional mobility training;Therapeutic activities;Therapeutic exercise;Balance training;Neuromuscular re-education;Patient/family education;Manual techniques;Passive range of motion;Dry needling;Taping;Joint Manipulations;Spinal Manipulations;Energy conservation;Vasopneumatic Device;Orthotic Fit/Training    PT Next Visit Plan assess response to PNE, QL stretch    PT Home Exercise Plan    Consulted and Agree with Plan of Care Patient             Patient will benefit from skilled therapeutic intervention in order to improve the following  deficits and impairments:  Abnormal gait, Pain, Improper body mechanics, Decreased mobility, Decreased activity tolerance, Decreased range of motion, Decreased endurance, Decreased strength, Impaired perceived  functional ability, Impaired UE functional use, Impaired flexibility, Decreased safety awareness, Decreased balance  Visit Diagnosis: Cervicalgia  Muscle weakness (generalized)  Chronic right-sided low back pain without sciatica     Problem List Patient Active Problem List   Diagnosis Date Noted   Radiculopathy 08/17/2020   Rash 07/28/2020   Vitamin D deficiency 05/03/2020   Neuropathy 04/06/2020   S/P ECT (electroconvulsive therapy) 06/02/2019   Memory loss due to medical condition 06/02/2019   Intention tremor 06/02/2019   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   OSA on CPAP 01/26/2019   Long term use of drug 10/08/2018   Insomnia 10/08/2018   Anxiety 10/08/2018   Tremors of nervous system 10/08/2018   Acne 01/10/2018   Primary hypercoagulable state (HCC) 10/10/2017   Vitamin B12 deficiency 03/18/2017   Diffuse pain 02/22/2017   Morbid obesity (HCC) 06/22/2016   MDD (major depressive disorder), recurrent severe, without psychosis (HCC) 06/14/2016   Intentional drug overdose (HCC)    Diabetes mellitus type 2 in obese (HCC) 06/11/2016   Positive ANA (antinuclear antibody) 04/02/2016   Hyperparathyroidism (HCC) 01/09/2016   Snoring 09/20/2015   Cervical disc disorder with radiculopathy of cervical region 05/12/2015   Hepatic steatosis 04/18/2015   Achilles tendonitis 04/08/2012   PTE (pulmonary thromboembolism) (HCC) 10/10/2011   Affective disorder (HCC) 10/10/2011   AR (allergic rhinitis) 10/10/2011   Sedalia Muta, PT, DPT 2:10 PM  11/04/20     [facility] [address] [place], [place], [address] Phone: [phone]   Fax:  [phone]  Name: Matia Zelada MRN: [id number] Date of Birth: Jul 28, 1965

## 2020-11-06 ENCOUNTER — Other Ambulatory Visit: Payer: Self-pay | Admitting: Family Medicine

## 2020-11-07 DIAGNOSIS — Z9889 Other specified postprocedural states: Secondary | ICD-10-CM | POA: Diagnosis not present

## 2020-11-08 ENCOUNTER — Ambulatory Visit (INDEPENDENT_AMBULATORY_CARE_PROVIDER_SITE_OTHER): Payer: BC Managed Care – PPO | Admitting: Physical Therapy

## 2020-11-08 DIAGNOSIS — M6281 Muscle weakness (generalized): Secondary | ICD-10-CM | POA: Diagnosis not present

## 2020-11-08 DIAGNOSIS — M545 Low back pain, unspecified: Secondary | ICD-10-CM

## 2020-11-08 DIAGNOSIS — M542 Cervicalgia: Secondary | ICD-10-CM | POA: Diagnosis not present

## 2020-11-08 DIAGNOSIS — F332 Major depressive disorder, recurrent severe without psychotic features: Secondary | ICD-10-CM | POA: Diagnosis not present

## 2020-11-08 DIAGNOSIS — G8929 Other chronic pain: Secondary | ICD-10-CM | POA: Diagnosis not present

## 2020-11-09 ENCOUNTER — Encounter (HOSPITAL_BASED_OUTPATIENT_CLINIC_OR_DEPARTMENT_OTHER): Payer: BC Managed Care – PPO | Admitting: Physical Therapy

## 2020-11-09 ENCOUNTER — Encounter: Payer: Self-pay | Admitting: Physical Therapy

## 2020-11-09 NOTE — Therapy (Signed)
[facility] [address] [place], [place], [address] Phone: [phone]   Fax:  [phone]  Physical Therapy Treatment  Patient Details  Name: Deborah Soto MRN: [id number] Date of Birth: [birth year] Referring Provider (PT): Jacquiline Doe, Loraine Leriche   Encounter Date: 11/08/2020   PT End of Session - 11/09/20 0718     Visit Number 7    Number of Visits 16    Date for PT Re-Evaluation 11/30/20    Authorization Type BCBS    PT Start Time 1215    PT Stop Time 1301    PT Time Calculation (min) 46 min    Activity Tolerance Patient tolerated treatment well    Behavior During Therapy [facility] for tasks assessed/performed             Past Medical History:  Diagnosis Date   Anemia    Anxiety    Asthma    Depression    Diabetes mellitus type 2 in obese (HCC) 06/11/2016   Dyspnea    Embolism - blood clot January 1997 & January 2017   Also had LLE DVT in 1997   History of kidney stones    Hypertension    Preeclampsia 10/10/2011   1999    Sleep apnea     Past Surgical History:  Procedure Laterality Date   ANTERIOR CERVICAL DECOMPRESSION/DISCECTOMY FUSION 4 LEVELS N/A 08/17/2020   Procedure: ANTERIOR CERVICAL DECOMPRESSION FUSION CERVICAL THREE - CERVICAL FOUR, CERVICAL FOUR- CERVICAL FIVE, CERVICAL FIVE- CERVICAL SIX WITH INSTRUMENTATION AND ALLOGRAFT;  Surgeon: Estill Bamberg, MD;  Location: [facility] OR;  Service: Orthopedics;  Laterality: N/A;   KIDNEY STONE SURGERY      There were no vitals filed for this visit.   Subjective Assessment - 11/09/20 0643     Subjective Pt states some improvments in overall pain. She is also feeling like she has a better outlook and theat depression is improving. Pt reports improved ability for stair climbing.She had f/u for neck fusion, with good report, good healing. Also is now back on Celebrex which she is happy about.    Currently in Pain? Yes    Pain Score 5     Pain Location Neck    Pain Orientation  Right    Pain Descriptors / Indicators Aching    Pain Type Chronic pain    Pain Onset More than a month ago    Pain Frequency Intermittent    Pain Score 6    Pain Location Back    Pain Orientation Right    Pain Descriptors / Indicators Aching    Pain Type Acute pain;Chronic pain    Pain Onset More than a month ago    Pain Frequency Intermittent    Aggravating Factors  transfers    Pain Score 4    Pain Location Knee    Pain Orientation Left;Right    Pain Descriptors / Indicators Aching    Pain Type Chronic pain    Pain Onset More than a month ago    Pain Frequency Intermittent    Aggravating Factors  standing, stairs.                               [facility] Adult PT Treatment/Exercise - 11/09/20 0001       Neck Exercises: Theraband   Rows 20 reps;Green      Lumbar Exercises: Stretches   Lower Trunk Rotation Limitations x15    Other Lumbar  Stretch Exercise standing hip flexor stretch 30 sec x 2 bil;      Lumbar Exercises: Aerobic   Recumbent Bike L1 x 6 min;      Lumbar Exercises: Standing   Other Standing Lumbar Exercises standing march x 20;    Other Standing Lumbar Exercises step ups 6 in, 1 hand x 10 bil;  Stairs, recipricol 1 HR, 6 in x 5;      Lumbar Exercises: Seated   Sit to Stand 10 reps      Lumbar Exercises: Supine   Clam 20 reps    Clam Limitations GTB    Bridge 20 reps    Straight Leg Raise 10 reps    Straight Leg Raises Limitations bil      Manual Therapy   Soft tissue mobilization STM/ DTM to R upper lumbar paraspinals and QL                    PT Education - 11/09/20 7017     Education Details Reviewed HEP.    Person(s) Educated Patient    Methods Explanation;Demonstration;Tactile cues;Verbal cues;Handout    Comprehension Verbalized understanding;Returned demonstration;Verbal cues required;Tactile cues required;Need further instruction              PT Short Term Goals - 10/13/20 2125       PT SHORT TERM  GOAL #1   Title Pt to be independent wtih initial HEP    Time 2    Period Weeks    Status New    Target Date 10/19/20               PT Long Term Goals - 10/13/20 2126       PT LONG TERM GOAL #1   Title Pt to be independent with final HEP    Time 8    Period Weeks    Status New    Target Date 11/30/20      PT LONG TERM GOAL #2   Title Pt to report decreased pain in neck and shoulders to 0-2/10 with ROM and UE activities, to improve ability for ADLS and IADLS.    Time 8    Period Weeks    Status New    Target Date 11/30/20      PT LONG TERM GOAL #3   Title Pt to demo improved grip strength by at least 10 lb bilaterally, to improve ability for lift, carry,IADLS and fine motor tasks    Time 8    Period Weeks    Status New    Target Date 11/30/20      PT LONG TERM GOAL #4   Title Pt to demo improved strength of bil LEs to at least 4+/5 to improve ability for transfers, stairs, and walking.    Time 8    Period Weeks    Status New    Target Date 11/30/20      PT LONG TERM GOAL #5   Title Pt to demo dynamic balance to be WNL (for pt age) with gait and stairs, to improve ability for community and functional activity, and to decrease risk for falls.    Time 8    Period Weeks    Status New    Target Date 11/30/20                   Plan - 11/09/20 0720     Clinical Impression Statement Pt with much improvment with ability and tolerance for performing  ther ex. Much improved ability and confidence wth stair climbing, with less knee pain. Pt does have much tenderness in R side of low thoracic/lumbar musculature, some improvment after tissue mobilization today. Plan to continue core strengthening, and practice optimal supine to sit transfer technique for decreasing pain with this. Pt to benefit from continued strengthening for shoulders, postural muscles, core, and LEs.    PT Frequency 2x / week    PT Duration 8 weeks    PT Treatment/Interventions ADLs/Self Care  Home Management;Cryotherapy;Electrical Stimulation;DME Instruction;Ultrasound;Iontophoresis 4mg /ml Dexamethasone;Moist Heat;Traction;Gait training;Stair training;Functional mobility training;Therapeutic activities;Therapeutic exercise;Balance training;Neuromuscular re-education;Patient/family education;Manual techniques;Passive range of motion;Dry needling;Taping;Joint Manipulations;Spinal Manipulations;Energy conservation;Vasopneumatic Device;Orthotic Fit/Training    PT Next Visit Plan assess response to PNE, QL stretch    PT Home Exercise Plan    Consulted and Agree with Plan of Care Patient             Patient will benefit from skilled therapeutic intervention in order to improve the following deficits and impairments:  Abnormal gait, Pain, Improper body mechanics, Decreased mobility, Decreased activity tolerance, Decreased range of motion, Decreased endurance, Decreased strength, Impaired perceived functional ability, Impaired UE functional use, Impaired flexibility, Decreased safety awareness, Decreased balance  Visit Diagnosis: Cervicalgia  Muscle weakness (generalized)  Chronic right-sided low back pain without sciatica     Problem List Patient Active Problem List   Diagnosis Date Noted   Radiculopathy 08/17/2020   Rash 07/28/2020   Vitamin D deficiency 05/03/2020   Neuropathy 04/06/2020   S/P ECT (electroconvulsive therapy) 06/02/2019   Memory loss due to medical condition 06/02/2019   Intention tremor 06/02/2019   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   OSA on CPAP 01/26/2019   Long term use of drug 10/08/2018   Insomnia 10/08/2018   Anxiety 10/08/2018   Tremors of nervous system 10/08/2018   Acne 01/10/2018   Primary hypercoagulable state (HCC) 10/10/2017   Vitamin B12 deficiency 03/18/2017   Diffuse pain 02/22/2017   Morbid obesity (HCC) 06/22/2016   MDD (major depressive disorder),  recurrent severe, without psychosis (HCC) 06/14/2016   Intentional drug overdose (HCC)    Diabetes mellitus type 2 in obese (HCC) 06/11/2016   Positive ANA (antinuclear antibody) 04/02/2016   Hyperparathyroidism (HCC) 01/09/2016   Snoring 09/20/2015   Cervical disc disorder with radiculopathy of cervical region 05/12/2015   Hepatic steatosis 04/18/2015   Achilles tendonitis 04/08/2012   PTE (pulmonary thromboembolism) (HCC) 10/10/2011   Affective disorder (HCC) 10/10/2011   AR (allergic rhinitis) 10/10/2011   10/12/2011, PT, DPT 8:01 AM  11/09/20    [facility] [address] [place], [place], [place] Phone: [phone]   Fax:  [phone]  Name: Nina Mondor MRN: [name] Date of Birth: [birth year]

## 2020-11-10 ENCOUNTER — Other Ambulatory Visit: Payer: Self-pay

## 2020-11-10 ENCOUNTER — Encounter: Payer: Self-pay | Admitting: Physical Therapy

## 2020-11-10 ENCOUNTER — Ambulatory Visit (INDEPENDENT_AMBULATORY_CARE_PROVIDER_SITE_OTHER): Payer: BC Managed Care – PPO | Admitting: Physical Therapy

## 2020-11-10 ENCOUNTER — Other Ambulatory Visit: Payer: Self-pay | Admitting: Family Medicine

## 2020-11-10 DIAGNOSIS — M542 Cervicalgia: Secondary | ICD-10-CM

## 2020-11-10 DIAGNOSIS — M545 Low back pain, unspecified: Secondary | ICD-10-CM | POA: Diagnosis not present

## 2020-11-10 DIAGNOSIS — M6281 Muscle weakness (generalized): Secondary | ICD-10-CM

## 2020-11-10 DIAGNOSIS — G8929 Other chronic pain: Secondary | ICD-10-CM

## 2020-11-10 DIAGNOSIS — F332 Major depressive disorder, recurrent severe without psychotic features: Secondary | ICD-10-CM | POA: Diagnosis not present

## 2020-11-10 NOTE — Therapy (Signed)
[facility] [address] [place], [place], [address] Phone: [phone]   Fax:  [phone]  Physical Therapy Treatment  Patient Details  Name: Deborah Soto MRN: [id number] Date of Birth: 1965-12-03 Referring Provider (PT): Jacquiline Doe, Loraine Leriche   Encounter Date: 11/10/2020   PT End of Session - 11/10/20 0858     Visit Number 8    Number of Visits 16    Date for PT Re-Evaluation 11/30/20    Authorization Type BCBS    PT Start Time 0846    PT Stop Time 0926    PT Time Calculation (min) 40 min    Activity Tolerance Patient tolerated treatment well    Behavior During Therapy [facility] for tasks assessed/performed             Past Medical History:  Diagnosis Date   Anemia    Anxiety    Asthma    Depression    Diabetes mellitus type 2 in obese (HCC) 06/11/2016   Dyspnea    Embolism - blood clot January 1997 & January 2017   Also had LLE DVT in 1997   History of kidney stones    Hypertension    Preeclampsia 10/10/2011   1999    Sleep apnea     Past Surgical History:  Procedure Laterality Date   ANTERIOR CERVICAL DECOMPRESSION/DISCECTOMY FUSION 4 LEVELS N/A 08/17/2020   Procedure: ANTERIOR CERVICAL DECOMPRESSION FUSION CERVICAL THREE - CERVICAL FOUR, CERVICAL FOUR- CERVICAL FIVE, CERVICAL FIVE- CERVICAL SIX WITH INSTRUMENTATION AND ALLOGRAFT;  Surgeon: Estill Bamberg, MD;  Location: [facility] OR;  Service: Orthopedics;  Laterality: N/A;   KIDNEY STONE SURGERY      There were no vitals filed for this visit.   Subjective Assessment - 11/10/20 0854     Subjective Pt states she has been doing up and down the stairs much more at home.    Currently in Pain? Yes    Pain Score 4     Pain Location Neck    Pain Orientation Right    Pain Descriptors / Indicators Aching    Pain Type Acute pain    Pain Onset More than a month ago    Pain Frequency Intermittent    Pain Score 6    Pain Location Back    Pain Orientation Right    Pain  Descriptors / Indicators Aching    Pain Type Chronic pain    Pain Onset More than a month ago    Pain Frequency Intermittent    Pain Score 5    Pain Location Knee    Pain Orientation Right;Left    Pain Descriptors / Indicators Aching    Pain Type Chronic pain    Pain Onset More than a month ago    Pain Frequency Intermittent                [facility] PT Assessment - 11/10/20 0001       Strength   Right Hand Grip (lbs) 50    Left Hand Grip (lbs) 45                           [facility] Adult PT Treatment/Exercise - 11/10/20 0001       Transfers   Comments sit to/from supine transfer: education on log roll for pain      Neck Exercises: Theraband   Rows 20 reps;Green    Shoulder External Rotation 20 reps    Shoulder External  Rotation Limitations RTB      Neck Exercises: Standing   Neck Retraction 10 reps    Other Standing Exercises Wall push ups x 15;    Other Standing Exercises Bicep curls 3lb x 20;      Lumbar Exercises: Stretches   Lower Trunk Rotation Limitations x15    Other Lumbar Stretch Exercise --      Lumbar Exercises: Aerobic   Recumbent Bike L1 x 8 min;      Lumbar Exercises: Standing   Other Standing Lumbar Exercises standing march x 20; mini squats x 10;    Other Standing Lumbar Exercises --      Lumbar Exercises: Supine   Bridge 20 reps    Straight Leg Raise 20 reps    Straight Leg Raises Limitations bil                      PT Short Term Goals - 10/13/20 2125       PT SHORT TERM GOAL #1   Title Pt to be independent wtih initial HEP    Time 2    Period Weeks    Status New    Target Date 10/19/20               PT Long Term Goals - 10/13/20 2126       PT LONG TERM GOAL #1   Title Pt to be independent with final HEP    Time 8    Period Weeks    Status New    Target Date 11/30/20      PT LONG TERM GOAL #2   Title Pt to report decreased pain in neck and shoulders to 0-2/10 with ROM and UE activities, to  improve ability for ADLS and IADLS.    Time 8    Period Weeks    Status New    Target Date 11/30/20      PT LONG TERM GOAL #3   Title Pt to demo improved grip strength by at least 10 lb bilaterally, to improve ability for lift, carry,IADLS and fine motor tasks    Time 8    Period Weeks    Status New    Target Date 11/30/20      PT LONG TERM GOAL #4   Title Pt to demo improved strength of bil LEs to at least 4+/5 to improve ability for transfers, stairs, and walking.    Time 8    Period Weeks    Status New    Target Date 11/30/20      PT LONG TERM GOAL #5   Title Pt to demo dynamic balance to be WNL (for pt age) with gait and stairs, to improve ability for community and functional activity, and to decrease risk for falls.    Time 8    Period Weeks    Status New    Target Date 11/30/20                   Plan - 11/10/20 0929     Clinical Impression Statement Pt with improving ability and tolerance for ther ex and strengthening. She continues to report moderate pain levels but states pain is better, and is doing more physical activity without pain increasing. Plan to continue UE/LE and core strength. Pt with most tenderness in c-t junction area, no pain in upper/mid cervical, and ROM is WNL. Disucssed posture and head positioning for improving forward flexed posture and pain in this  region. Pt also with  much improved grip strength with testing today.    PT Frequency 2x / week    PT Duration 8 weeks    PT Treatment/Interventions ADLs/Self Care Home Management;Cryotherapy;Electrical Stimulation;DME Instruction;Ultrasound;Iontophoresis 4mg /ml Dexamethasone;Moist Heat;Traction;Gait training;Stair training;Functional mobility training;Therapeutic activities;Therapeutic exercise;Balance training;Neuromuscular re-education;Patient/family education;Manual techniques;Passive range of motion;Dry needling;Taping;Joint Manipulations;Spinal Manipulations;Energy conservation;Vasopneumatic  Device;Orthotic Fit/Training    PT Next Visit Plan assess response to PNE, QL stretch    PT Home Exercise Plan    Consulted and Agree with Plan of Care Patient             Patient will benefit from skilled therapeutic intervention in order to improve the following deficits and impairments:  Abnormal gait, Pain, Improper body mechanics, Decreased mobility, Decreased activity tolerance, Decreased range of motion, Decreased endurance, Decreased strength, Impaired perceived functional ability, Impaired UE functional use, Impaired flexibility, Decreased safety awareness, Decreased balance  Visit Diagnosis: Cervicalgia  Muscle weakness (generalized)  Chronic right-sided low back pain without sciatica     Problem List Patient Active Problem List   Diagnosis Date Noted   Radiculopathy 08/17/2020   Rash 07/28/2020   Vitamin D deficiency 05/03/2020   Neuropathy 04/06/2020   S/P ECT (electroconvulsive therapy) 06/02/2019   Memory loss due to medical condition 06/02/2019   Intention tremor 06/02/2019   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   OSA on CPAP 01/26/2019   Long term use of drug 10/08/2018   Insomnia 10/08/2018   Anxiety 10/08/2018   Tremors of nervous system 10/08/2018   Acne 01/10/2018   Primary hypercoagulable state (HCC) 10/10/2017   Vitamin B12 deficiency 03/18/2017   Diffuse pain 02/22/2017   Morbid obesity (HCC) 06/22/2016   MDD (major depressive disorder), recurrent severe, 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 [place], [place], [place] Phone: [phone]   Fax:  [phone]  Name: Janvi Ammar MRN: [name] Date of Birth: January 13, 1966

## 2020-11-10 NOTE — Telephone Encounter (Signed)
Pt requesting refill for Pantoprazole. Last ordered 2020.

## 2020-11-11 ENCOUNTER — Ambulatory Visit: Payer: BC Managed Care – PPO | Admitting: Psychiatry

## 2020-11-11 ENCOUNTER — Encounter (HOSPITAL_BASED_OUTPATIENT_CLINIC_OR_DEPARTMENT_OTHER): Payer: Self-pay | Admitting: Physical Therapy

## 2020-11-11 NOTE — Progress Notes (Signed)
   I, Christoper Fabian, LAT, ATC, am serving as scribe for Dr. [name].  Deborah Soto is a 55 y.o. female who presents to [facility] today for f/u of mid-back and R scapular pain.  She was last seen by Dr. Denyse Amass on 10/14/20 and was referred to PT of which she has completed 8 sessions.  She had a cervical fusion performed by Dr. Yevette Edwards on 08/17/20.  Today, pt reports that she's feeling better, rating her improvement at 50%.  She con't to take Celebrex.  She has more PT visits scheduled for both her neck, back and leg pain.  Diagnostic testing: C-spine XR- 08/17/20; T-spine MRI- 07/26/20; C-spine MRI- 05/04/20   Pertinent review of systems: No fevers or chills  Relevant historical information: Multilevel cervical spine fusion June 2022   Exam:  BP 100/70 (BP Location: Left Arm, Patient Position: Sitting, Cuff Size: Large)   Pulse 93   Ht 5\' 8"  (1.727 m)   Wt 251 lb 3.2 oz (113.9 kg)   LMP  (LMP Unknown) Comment: last period ---  1 year ago (?)  SpO2 98%   BMI 38.19 kg/m  General: Well Developed, well nourished, and in no acute distress.   MSK: C-spine and T-spine nontender midline. Mildly tender to palpation bilateral trapezius. Decreased motion C-spine and T-spine. Upper extremity strength is intact.    Lab and Radiology Results EXAM: MRI THORACIC SPINE WITHOUT CONTRAST   TECHNIQUE: Multiplanar, multisequence MR imaging of the thoracic spine was performed. No intravenous contrast was administered.   COMPARISON:  None.   FINDINGS: Alignment:  Normal.   Vertebrae: Vertebral body heights are maintained. No specific evidence of acute fracture, discitis/osteomyelitis, or suspicious bone lesion.   Cord:  Normal cord signal.   Paraspinal and other soft tissues: Unremarkable.   Disc levels:   There are posterior disc bulges at T3-T4, T5-T6, T6-T7, T7-T8 and T8-T9. The largest disc protrusion is at T6-T7, where it contacts and flattens the  cord. No significant canal stenosis. Multilevel facet hypertrophy with mild to moderate bilateral foraminal stenosis at T8-T9 and mild bilateral foraminal stenosis at T9-T10.   IMPRESSION: 1. Multilevel disc protrusions, largest at T6-T7 with disc contacting and flattening the ventral cord. No significant canal stenosis. 2. Mild-to-moderate foraminal stenosis at T8-T9 and mild bilateral foraminal stenosis at T9-T10. 3. Normal cord signal.     Electronically Signed   By: MD   On: 07/28/2020 09:10   I, 07/30/2020, personally (independently) visualized and performed the interpretation of the images attached in this note.      Assessment and Plan: 55 y.o. female with thoracic back pain thought to be more musculature related.  Patient has improved quite a bit with physical therapy.  Plan to continue PT for this and her cervical spine pain postsurgically.  Continue PT and progress to home exercise program as directed with PT.  Recheck back with me as needed.  Precautions reviewed. Total encounter time 20 minutes including face-to-face time with the patient and, reviewing past medical record, and charting on the date of service.      Discussed warning signs or symptoms. Please see discharge instructions. Patient expresses understanding.   The above documentation has been reviewed and is accurate and complete 44, M.D.

## 2020-11-14 DIAGNOSIS — F332 Major depressive disorder, recurrent severe without psychotic features: Secondary | ICD-10-CM | POA: Diagnosis not present

## 2020-11-15 ENCOUNTER — Encounter (HOSPITAL_BASED_OUTPATIENT_CLINIC_OR_DEPARTMENT_OTHER): Payer: Self-pay | Admitting: Physical Therapy

## 2020-11-15 ENCOUNTER — Other Ambulatory Visit: Payer: Self-pay

## 2020-11-15 ENCOUNTER — Encounter: Payer: Self-pay | Admitting: Family Medicine

## 2020-11-15 ENCOUNTER — Ambulatory Visit (INDEPENDENT_AMBULATORY_CARE_PROVIDER_SITE_OTHER): Payer: BC Managed Care – PPO | Admitting: Family Medicine

## 2020-11-15 VITALS — BP 100/70 | HR 93 | Ht 68.0 in | Wt 251.2 lb

## 2020-11-15 DIAGNOSIS — M546 Pain in thoracic spine: Secondary | ICD-10-CM | POA: Diagnosis not present

## 2020-11-15 DIAGNOSIS — F332 Major depressive disorder, recurrent severe without psychotic features: Secondary | ICD-10-CM | POA: Diagnosis not present

## 2020-11-15 NOTE — Patient Instructions (Signed)
Thank you for coming in today.   Continue the PT and home exercises.   I think you are making a lot of progress.    Let me know if you need anything,   I am sure we will need to see each other in the future.   Recheck as needed.

## 2020-11-21 ENCOUNTER — Ambulatory Visit (INDEPENDENT_AMBULATORY_CARE_PROVIDER_SITE_OTHER): Payer: BC Managed Care – PPO | Admitting: Physical Therapy

## 2020-11-21 ENCOUNTER — Encounter: Payer: Self-pay | Admitting: Physical Therapy

## 2020-11-21 DIAGNOSIS — M545 Low back pain, unspecified: Secondary | ICD-10-CM | POA: Diagnosis not present

## 2020-11-21 DIAGNOSIS — M542 Cervicalgia: Secondary | ICD-10-CM

## 2020-11-21 DIAGNOSIS — M6281 Muscle weakness (generalized): Secondary | ICD-10-CM | POA: Diagnosis not present

## 2020-11-21 DIAGNOSIS — G8929 Other chronic pain: Secondary | ICD-10-CM

## 2020-11-21 NOTE — Therapy (Signed)
[facility] [address] [place], [place], [address] Phone: [phone]   Fax:  [phone]  Physical Therapy Treatment  Patient Details  Name: Deborah Soto MRN: [id number] Date of Birth: 12-12-1965 Referring Provider (PT): Jacquiline Doe, Loraine Leriche   Encounter Date: 11/21/2020   PT End of Session - 11/21/20 1116     Visit Number 9    Number of Visits 16    Date for PT Re-Evaluation 11/30/20    Authorization Type [employer]    PT Start Time 1105    PT Stop Time 1145    PT Time Calculation (min) 40 min    Activity Tolerance Patient tolerated treatment well    Behavior During Therapy [facility] for tasks assessed/performed             Past Medical History:  Diagnosis Date   Anemia    Anxiety    Asthma    Depression    Diabetes mellitus type 2 in obese (HCC) 06/11/2016   Dyspnea    Embolism - blood clot January 1997 & January 2017   Also had LLE DVT in 1997   History of kidney stones    Hypertension    Preeclampsia 10/10/2011   1999    Sleep apnea     Past Surgical History:  Procedure Laterality Date   ANTERIOR CERVICAL DECOMPRESSION/DISCECTOMY FUSION 4 LEVELS N/A 08/17/2020   Procedure: ANTERIOR CERVICAL DECOMPRESSION FUSION CERVICAL THREE - CERVICAL FOUR, CERVICAL FOUR- CERVICAL FIVE, CERVICAL FIVE- CERVICAL SIX WITH INSTRUMENTATION AND ALLOGRAFT;  Surgeon: Estill Bamberg, MD;  Location: [facility] OR;  Service: Orthopedics;  Laterality: N/A;   KIDNEY STONE SURGERY      There were no vitals filed for this visit.   Subjective Assessment - 11/21/20 1115     Subjective Pt states no pain today. She did a lot of activity over the weekend, and did well. She has mild soreness in upper back after riding in car.    Currently in Pain? No/denies    Pain Score 0-No pain                               [facility] Adult PT Treatment/Exercise - 11/21/20 0001       Transfers   Comments --      Neck Exercises: Theraband   Rows 20  reps;Green    Shoulder External Rotation 20 reps    Shoulder External Rotation Limitations GTB      Neck Exercises: Standing   Neck Retraction 10 reps    Other Standing Exercises Wall push ups x 15;    Other Standing Exercises Bicep curls 4 lb x 20;      Lumbar Exercises: Stretches   Lower Trunk Rotation Limitations x15      Lumbar Exercises: Aerobic   Recumbent Bike L1 x 8 min;      Lumbar Exercises: Standing   Other Standing Lumbar Exercises mini squats x 20;      Lumbar Exercises: Supine   Bridge 20 reps    Straight Leg Raise --    Straight Leg Raises Limitations --      Lumbar Exercises: Sidelying   Hip Abduction Both;20 reps                       PT Short Term Goals - 10/13/20 2125       PT SHORT TERM GOAL #1   Title Pt to  be independent wtih initial HEP    Time 2    Period Weeks    Status New    Target Date 10/19/20               PT Long Term Goals - 10/13/20 2126       PT LONG TERM GOAL #1   Title Pt to be independent with final HEP    Time 8    Period Weeks    Status New    Target Date 11/30/20      PT LONG TERM GOAL #2   Title Pt to report decreased pain in neck and shoulders to 0-2/10 with ROM and UE activities, to improve ability for ADLS and IADLS.    Time 8    Period Weeks    Status New    Target Date 11/30/20      PT LONG TERM GOAL #3   Title Pt to demo improved grip strength by at least 10 lb bilaterally, to improve ability for lift, carry,IADLS and fine motor tasks    Time 8    Period Weeks    Status New    Target Date 11/30/20      PT LONG TERM GOAL #4   Title Pt to demo improved strength of bil LEs to at least 4+/5 to improve ability for transfers, stairs, and walking.    Time 8    Period Weeks    Status New    Target Date 11/30/20      PT LONG TERM GOAL #5   Title Pt to demo dynamic balance to be WNL (for pt age) with gait and stairs, to improve ability for community and functional activity, and to decrease  risk for falls.    Time 8    Period Weeks    Status New    Target Date 11/30/20                   Plan - 11/21/20 1152     Clinical Impression Statement Pt progressing very well with pain relief. She has less pain than she has in months. She is doing well with strengthening but continues to be challenged due to weakness, especially for LE strengthening. Pt requires cuing for posture, to decrease forward head and thoracic kyphosis with UE exercises. Pt to benefit from continued and progressive strengthening for UE/LE, and postural muscles.    PT Frequency 2x / week    PT Duration 8 weeks    PT Treatment/Interventions ADLs/Self Care Home Management;Cryotherapy;Electrical Stimulation;DME Instruction;Ultrasound;Iontophoresis 4mg /ml Dexamethasone;Moist Heat;Traction;Gait training;Stair training;Functional mobility training;Therapeutic activities;Therapeutic exercise;Balance training;Neuromuscular re-education;Patient/family education;Manual techniques;Passive range of motion;Dry needling;Taping;Joint Manipulations;Spinal Manipulations;Energy conservation;Vasopneumatic Device;Orthotic Fit/Training    PT Next Visit Plan assess response to PNE, QL stretch    PT Home Exercise Plan    Consulted and Agree with Plan of Care Patient             Patient will benefit from skilled therapeutic intervention in order to improve the following deficits and impairments:  Abnormal gait, Pain, Improper body mechanics, Decreased mobility, Decreased activity tolerance, Decreased range of motion, Decreased endurance, Decreased strength, Impaired perceived functional ability, Impaired UE functional use, Impaired flexibility, Decreased safety awareness, Decreased balance  Visit Diagnosis: Cervicalgia  Muscle weakness (generalized)  Chronic right-sided low back pain without sciatica     Problem List Patient Active Problem List   Diagnosis Date Noted   Radiculopathy 08/17/2020   Rash  07/28/2020   Vitamin D deficiency 05/03/2020  Neuropathy 04/06/2020   S/P ECT (electroconvulsive therapy) 06/02/2019   Memory loss due to medical condition 06/02/2019   Intention tremor 06/02/2019   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   OSA on CPAP 01/26/2019   Long term use of drug 10/08/2018   Insomnia 10/08/2018   Anxiety 10/08/2018   Tremors of nervous system 10/08/2018   Acne 01/10/2018   Primary hypercoagulable state (HCC) 10/10/2017   Vitamin B12 deficiency 03/18/2017   Diffuse pain 02/22/2017   Morbid obesity (HCC) 06/22/2016   MDD (major depressive disorder), recurrent severe, 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 [place], [place], [address] Phone: [phone]   Fax:  [phone]  Name: Deborah Soto MRN: [id number] Date of Birth: Oct 26, 1965

## 2020-11-22 ENCOUNTER — Other Ambulatory Visit: Payer: Self-pay | Admitting: Psychiatry

## 2020-11-22 ENCOUNTER — Other Ambulatory Visit: Payer: Self-pay

## 2020-11-22 ENCOUNTER — Encounter: Payer: Self-pay | Admitting: Physician Assistant

## 2020-11-22 ENCOUNTER — Ambulatory Visit (INDEPENDENT_AMBULATORY_CARE_PROVIDER_SITE_OTHER): Payer: BC Managed Care – PPO | Admitting: Physician Assistant

## 2020-11-22 VITALS — BP 118/70 | HR 86 | Temp 97.5°F | Ht 68.0 in | Wt 255.4 lb

## 2020-11-22 DIAGNOSIS — M7989 Other specified soft tissue disorders: Secondary | ICD-10-CM | POA: Diagnosis not present

## 2020-11-22 DIAGNOSIS — Z79899 Other long term (current) drug therapy: Secondary | ICD-10-CM

## 2020-11-22 DIAGNOSIS — F332 Major depressive disorder, recurrent severe without psychotic features: Secondary | ICD-10-CM | POA: Diagnosis not present

## 2020-11-22 LAB — COMPREHENSIVE METABOLIC PANEL
ALT: 40 U/L — ABNORMAL HIGH (ref 0–35)
AST: 40 U/L — ABNORMAL HIGH (ref 0–37)
Albumin: 4.3 g/dL (ref 3.5–5.2)
Alkaline Phosphatase: 65 U/L (ref 39–117)
BUN: 8 mg/dL (ref 6–23)
CO2: 26 mEq/L (ref 19–32)
Calcium: 9.6 mg/dL (ref 8.4–10.5)
Chloride: 104 mEq/L (ref 96–112)
Creatinine, Ser: 0.66 mg/dL (ref 0.40–1.20)
GFR: 100.45 mL/min (ref >60.00)
Glucose, Bld: 151 mg/dL — ABNORMAL HIGH (ref 70–99)
Potassium: 4.1 mEq/L (ref 3.5–5.1)
Sodium: 140 mEq/L (ref 135–145)
Total Bilirubin: 0.7 mg/dL (ref 0.2–1.2)
Total Protein: 6.9 g/dL (ref 6.0–8.3)

## 2020-11-22 LAB — CBC WITH DIFFERENTIAL/PLATELET
Basophils Absolute: 0 10*3/uL (ref 0.0–0.1)
Basophils Relative: 0.6 % (ref 0.0–3.0)
Eosinophils Absolute: 0.1 10*3/uL (ref 0.0–0.7)
Eosinophils Relative: 1.4 % (ref 0.0–5.0)
HCT: 41.4 % (ref 36.0–46.0)
Hemoglobin: 13.1 g/dL (ref 12.0–15.0)
Lymphocytes Relative: 29.6 % (ref 12.0–46.0)
Lymphs Abs: 2.2 10*3/uL (ref 0.7–4.0)
MCHC: 31.6 g/dL (ref 30.0–36.0)
MCV: 85 fl (ref 78.0–100.0)
Monocytes Absolute: 0.6 10*3/uL (ref 0.1–1.0)
Monocytes Relative: 8.5 % (ref 3.0–12.0)
Neutro Abs: 4.5 10*3/uL (ref 1.4–7.7)
Neutrophils Relative %: 59.9 % (ref 43.0–77.0)
Platelets: 262 10*3/uL (ref 150.0–400.0)
RBC: 4.88 Mil/uL (ref 3.87–5.11)
RDW: 16.4 % — ABNORMAL HIGH (ref 11.5–15.5)
WBC: 7.4 10*3/uL (ref 4.0–10.5)

## 2020-11-22 LAB — TSH: TSH: 1.89 u[IU]/mL (ref 0.35–5.50)

## 2020-11-22 MED ORDER — FUROSEMIDE 20 MG PO TABS: 20.0000 mg | ORAL_TABLET | Freq: Every day | ORAL | 0 refills | Status: DC

## 2020-11-22 NOTE — Progress Notes (Signed)
Deborah Soto is a 55 y.o. female here for a new problem.  I acted as a Neurosurgeon for [employer], PA-C [employer], LPN   History of Present Illness:   Chief Complaint  Patient presents with   Edema     HPI  Edema Pt c/o bilateral ankle and feet edema since this past weekend. Pt did travel this past week by car.  She recently traveled to the beach and then she went to [place].  She states that she had temporary resolution of symptoms after elevation of legs.  Pt said this is the 4th time this has happened in the past months. She has been elevating her legs.  She reports that she does not eat a lot of salt at baseline.  She has had about 4 to 5 pounds of weight gain since seeing Dr. Denyse Amass last week.  She states that she has shortness of breath at her baseline, but does not have any increased shortness of breath at this time.  Denies chest pain or bilateral calf pain.  She is also experiencing some burning sensation in bilateral feet, and is concerned that she may have some new neuropathy.  She does take gabapentin 600 mg in the morning and 600 mg at night, was previously on a more significant amount but did not like it was helping.  She is drinking plenty of water, denies any new urinary symptoms.  She is compliant with all of her medications including her Xarelto.  Lithium use Patient is also requesting we do blood work today to update her lithium level for her psychiatrist.  This medication is managed by her psychiatrist.   Past Medical History:  Diagnosis Date   Anemia    Anxiety    Asthma    Depression    Diabetes mellitus type 2 in obese (HCC) 06/11/2016   Dyspnea    Embolism - blood clot January 1997 & January 2017   Also had LLE DVT in 1997   History of kidney stones    Hypertension    Preeclampsia 10/10/2011   1999    Sleep apnea      Social History   Tobacco Use   Smoking status: Former    Packs/day: 1.00    Years: 20.00    Pack years: 20.00    Types:  Cigarettes    Quit date: 03/12/2004    Years since quitting: 16.7   Smokeless tobacco: Never  Vaping Use   Vaping Use: Never used  Substance Use Topics   Alcohol use: Yes    Alcohol/week: 1.0 standard drink    Types: 1 Cans of beer per week    Comment: 1-2 times a week   Drug use: No    Past Surgical History:  Procedure Laterality Date   ANTERIOR CERVICAL DECOMPRESSION/DISCECTOMY FUSION 4 LEVELS N/A 08/17/2020   Procedure: ANTERIOR CERVICAL DECOMPRESSION FUSION CERVICAL THREE - CERVICAL FOUR, CERVICAL FOUR- CERVICAL FIVE, CERVICAL FIVE- CERVICAL SIX WITH INSTRUMENTATION AND ALLOGRAFT;  Surgeon: Estill Bamberg, MD;  Location: [facility] OR;  Service: Orthopedics;  Laterality: N/A;   KIDNEY STONE SURGERY      Family History  Problem Relation Age of Onset   Dementia Father    Heart disease Father    Clotting disorder Mother    Arthritis Mother    Clotting disorder Sister    Clotting disorder Maternal Grandmother    Clotting disorder Maternal Aunt    Rheumatologic disease Neg Hx    Hyperparathyroidism Neg Hx  Allergies  Allergen Reactions   Tramadol Other (See Comments)    Tingling all over    Hydrocodone     Ineffective     Current Medications:   Current Outpatient Medications:    amitriptyline (ELAVIL) 50 MG tablet, Take 1 tablet (50 mg total) by mouth at bedtime., Disp: 90 tablet, Rfl: 0   ampicillin (PRINCIPEN) 500 MG capsule, Take 500 mg by mouth daily., Disp: , Rfl:    celecoxib (CELEBREX) 100 MG capsule, Take 100 mg by mouth 2 (two) times daily., Disp: , Rfl:    Cholecalciferol (VITAMIN D) 50 MCG (2000 UT) tablet, Take 2,000 Units by mouth daily., Disp: , Rfl:    Continuous Blood Gluc Receiver (FREESTYLE LIBRE 2 READER) DEVI, E11.9, Disp: 1 each, Rfl: 0   Continuous Blood Gluc Sensor (FREESTYLE LIBRE 2 SENSOR) MISC, E11.9, Disp: 14 each, Rfl: 0   cyanocobalamin (,VITAMIN B-12,) 1000 MCG/ML injection, One Injection weekly for 4 weeks then one injection monthly for 3 month,  Disp: 7 mL, Rfl: 0   Empagliflozin-metFORMIN HCl ER (SYNJARDY XR) 25-1000 MG TB24, Take 1 tablet by mouth daily., Disp: 30 tablet, Rfl: 5   Esketamine HCl, 84 MG Dose, (SPRAVATO, 84 MG DOSE,) 28 MG/DEVICE SOPK, Place 84 mg into the nose See admin instructions. Use twice weekly for 4 weeks then reduce to once weekly, Disp: , Rfl:    gabapentin (NEURONTIN) 300 MG capsule, Take 2 capsules in the morning, 1 in the middle of the day, and 2 at night., Disp: 150 capsule, Rfl: 3   lithium carbonate (LITHOBID) 300 MG CR tablet, TAKE 3 TABLETS ORALLY AT BEDTIME., Disp: 90 tablet, Rfl: 0   methocarbamol (ROBAXIN) 500 MG tablet, Take 1 tablet (500 mg total) by mouth every 6 (six) hours as needed for muscle spasms., Disp: 30 tablet, Rfl: 2   Multiple Vitamin (MULTIVITAMIN WITH MINERALS) TABS tablet, Take 1 tablet by mouth daily., Disp: , Rfl:    pantoprazole (PROTONIX) 40 MG tablet, TAKE ONE TABLET BY MOUTH DAILY FOR STOMACH PROTECTION., Disp: 30 tablet, Rfl: 0   propranolol (INDERAL) 40 MG tablet, take ONE TO TWO tablets every EIGHT hours FOR anxiety, Disp: 150 tablet, Rfl: 0   rivaroxaban (XARELTO) 20 MG TABS tablet, Take 1 tablet (20 mg total) by mouth daily with supper., Disp: 30 tablet, Rfl: 5   tirzepatide (MOUNJARO) 15 MG/0.5ML Pen, Inject 15 mg into the skin once a week., Disp: 6 mL, Rfl: 1   TRESIBA FLEXTOUCH 100 UNIT/ML FlexTouch Pen, Inject 80 Units into the skin at bedtime., Disp: 24 mL, Rfl: 2   tretinoin (RETIN-A) 0.1 % cream, Apply 1 application topically at bedtime., Disp: , Rfl:    VIIBRYD 40 MG TABS, TAKE 1 & 1/2 TABLETS BY MOUTH DAILY., Disp: 45 tablet, Rfl: 1   Review of Systems:   ROS Negative unless otherwise specified per HPI.  Vitals:   Vitals:   11/22/20 1140  BP: 118/70  Pulse: 86  Temp: (!) 97.5 F (36.4 C)  TempSrc: Temporal  SpO2: 95%  Weight: 255 lb 6.1 oz (115.8 kg)  Height: 5\' 8"  (1.727 m)     Body mass index is 38.83 kg/m.  Physical Exam:   Physical  Exam Vitals and nursing note reviewed.  Constitutional:      General: She is not in acute distress.    Appearance: She is well-developed. She is not ill-appearing or toxic-appearing.  Cardiovascular:     Rate and Rhythm: Normal rate and regular rhythm.  Pulses: Normal pulses.     Heart sounds: Normal heart sounds, S1 normal and S2 normal.  Pulmonary:     Effort: Pulmonary effort is normal.     Breath sounds: Normal breath sounds.  Musculoskeletal:     Right lower leg: 2+ Edema present.     Left lower leg: 2+ Edema present.     Comments: No calf tenderness, swelling, erythema, and warmth bilaterally  Skin:    General: Skin is warm and dry.  Neurological:     Mental Status: She is alert.     GCS: GCS eye subscore is 4. GCS verbal subscore is 5. GCS motor subscore is 6.  Psychiatric:        Speech: Speech normal.        Behavior: Behavior normal. Behavior is cooperative.   Diabetic Foot Exam - Simple   Simple Foot Form Diabetic Foot exam was performed with the following findings: Yes 11/22/2020 12:40 PM  Visual Inspection No deformities, no ulcerations, no other skin breakdown bilaterally: Yes Sensation Testing Intact to touch and monofilament testing bilaterally: Yes Pulse Check Posterior Tibialis and Dorsalis pulse intact bilaterally: Yes Comments      Assessment and Plan:   1. Leg swelling No red flags on my exam or her history; foot exam with monofilament today was normal Suspect patient has dependent edema from recent travel Recommend that she continue to avoid salt and keep legs elevated Update blood work to assess for any possible organic cause of her symptoms I also gave her volume parameters on her after visit summary to call us if she has increase in weight gain and evidence parameters Start Lasix 20 mg daily until swelling has resolved, and then may use as needed If any worsening or no improvement within the next 1 to 2 weeks she needs to follow-up in our  office Red flags discussed and provided in written AVS  2. Lithium use Will order lithium level per patient request Management per psychiatry  CMA or LPN served as scribe during this visit. History, Physical, and Plan performed by medical provider. The above documentation has been reviewed and is accurate and complete.   Jarold Motto, PA-C

## 2020-11-22 NOTE — Patient Instructions (Signed)
It was great to see you!  Update blood work today today to make sure there are no organic causes of your swelling  Start 20 mg lasix daily until swelling improves  Call us:  Anytime you have any of the following symptoms:  1) 3 pound weight gain in 24 hours or 5 pounds in 1 week  2) new shortness of breath, with or without a dry hacking cough  3) worsening swelling in the hands, feet or stomach  4) if you have to sleep on extra pillows at night in order to breathe.   If symptoms worsen, let us know  If no improvement after 1-2 weeks, please come back and see Dr. [name]  Take care,  Jarold Motto PA-C

## 2020-11-23 ENCOUNTER — Encounter: Payer: BC Managed Care – PPO | Admitting: Physical Therapy

## 2020-11-23 LAB — LITHIUM LEVEL: Lithium Lvl: 0.7 mmol/L (ref 0.6–1.2)

## 2020-11-24 DIAGNOSIS — F332 Major depressive disorder, recurrent severe without psychotic features: Secondary | ICD-10-CM | POA: Diagnosis not present

## 2020-11-25 ENCOUNTER — Encounter: Payer: Self-pay | Admitting: Family Medicine

## 2020-11-28 ENCOUNTER — Ambulatory Visit (INDEPENDENT_AMBULATORY_CARE_PROVIDER_SITE_OTHER): Payer: BC Managed Care – PPO | Admitting: Physical Therapy

## 2020-11-28 ENCOUNTER — Other Ambulatory Visit: Payer: Self-pay

## 2020-11-28 DIAGNOSIS — M6281 Muscle weakness (generalized): Secondary | ICD-10-CM

## 2020-11-28 DIAGNOSIS — M545 Low back pain, unspecified: Secondary | ICD-10-CM

## 2020-11-28 DIAGNOSIS — G8929 Other chronic pain: Secondary | ICD-10-CM

## 2020-11-28 DIAGNOSIS — M542 Cervicalgia: Secondary | ICD-10-CM | POA: Diagnosis not present

## 2020-11-29 ENCOUNTER — Encounter: Payer: Self-pay | Admitting: Physical Therapy

## 2020-11-29 DIAGNOSIS — F332 Major depressive disorder, recurrent severe without psychotic features: Secondary | ICD-10-CM | POA: Diagnosis not present

## 2020-11-29 NOTE — Therapy (Signed)
[facility] [address] [place], [place], [address] Phone: [phone]   Fax:  [phone]  Physical Therapy Treatment  Patient Details  Name: Deborah Soto MRN: [id number] Date of Birth: October 27, 1965 Referring Provider (PT): Jacquiline Doe, Loraine Leriche   Encounter Date: 11/28/2020   PT End of Session - 11/29/20 1030     Visit Number 10    Number of Visits 16    Date for PT Re-Evaluation 11/30/20    Authorization Type BCBS    PT Start Time 1017    PT Stop Time 1100    PT Time Calculation (min) 43 min    Activity Tolerance Patient tolerated treatment well    Behavior During Therapy [facility] for tasks assessed/performed             Past Medical History:  Diagnosis Date   Anemia    Anxiety    Asthma    Depression    Diabetes mellitus type 2 in obese (HCC) 06/11/2016   Dyspnea    Embolism - blood clot January 1997 & January 2017   Also had LLE DVT in 1997   History of kidney stones    Hypertension    Preeclampsia 10/10/2011   1999    Sleep apnea     Past Surgical History:  Procedure Laterality Date   ANTERIOR CERVICAL DECOMPRESSION/DISCECTOMY FUSION 4 LEVELS N/A 08/17/2020   Procedure: ANTERIOR CERVICAL DECOMPRESSION FUSION CERVICAL THREE - CERVICAL FOUR, CERVICAL FOUR- CERVICAL FIVE, CERVICAL FIVE- CERVICAL SIX WITH INSTRUMENTATION AND ALLOGRAFT;  Surgeon: Estill Bamberg, MD;  Location: [facility];  Service: Orthopedics;  Laterality: N/A;   KIDNEY STONE SURGERY      There were no vitals filed for this visit.   Subjective Assessment - 11/29/20 1027     Subjective Pt states doing very well. She states mild soreness in base of cervical spine/ CT junction. Back feeling good. Legs still feel weak to her when she is walking around grocery store.    Currently in Pain? No/denies    Pain Score 0-No pain                               [facility] Adult PT Treatment/Exercise - 11/29/20 0001       Neck Exercises: Theraband    Rows 20 reps;Green    Shoulder External Rotation 20 reps    Shoulder External Rotation Limitations GTB      Neck Exercises: Standing   Neck Retraction 10 reps    Other Standing Exercises Wall push ups x 15;      Neck Exercises: Seated   Cervical Rotation 10 reps      Neck Exercises: Supine   Other Supine Exercise chin tucks x 10;      Lumbar Exercises: Stretches   Lower Trunk Rotation Limitations x15      Lumbar Exercises: Aerobic   Recumbent Bike L1 x 8 min;      Lumbar Exercises: Standing   Functional Squats 20 reps    Functional Squats Limitations at counter    Other Standing Lumbar Exercises Stairs: no UE , 5 steps x 5; recipricol      Lumbar Exercises: Supine   Bridge 20 reps      Lumbar Exercises: Sidelying   Hip Abduction Both;20 reps      Manual Therapy   Soft tissue mobilization STMTPR to bil UT, levator, and lower cervical paraspinals.  PT Education - 11/29/20 1028     Education Details Reviewed HEP and its importance    Person(s) Educated Patient    Methods Explanation;Demonstration;Tactile cues;Verbal cues;Handout    Comprehension Verbalized understanding;Verbal cues required;Returned demonstration;Tactile cues required;Need further instruction              PT Short Term Goals - 10/13/20 2125       PT SHORT TERM GOAL #1   Title Pt to be independent wtih initial HEP    Time 2    Period Weeks    Status New    Target Date 10/19/20               PT Long Term Goals - 10/13/20 2126       PT LONG TERM GOAL #1   Title Pt to be independent with final HEP    Time 8    Period Weeks    Status New    Target Date 11/30/20      PT LONG TERM GOAL #2   Title Pt to report decreased pain in neck and shoulders to 0-2/10 with ROM and UE activities, to improve ability for ADLS and IADLS.    Time 8    Period Weeks    Status New    Target Date 11/30/20      PT LONG TERM GOAL #3   Title Pt to demo improved grip  strength by at least 10 lb bilaterally, to improve ability for lift, carry,IADLS and fine motor tasks    Time 8    Period Weeks    Status New    Target Date 11/30/20      PT LONG TERM GOAL #4   Title Pt to demo improved strength of bil LEs to at least 4+/5 to improve ability for transfers, stairs, and walking.    Time 8    Period Weeks    Status New    Target Date 11/30/20      PT LONG TERM GOAL #5   Title Pt to demo dynamic balance to be WNL (for pt age) with gait and stairs, to improve ability for community and functional activity, and to decrease risk for falls.    Time 8    Period Weeks    Status New    Target Date 11/30/20                   Plan - 11/29/20 1033     Clinical Impression Statement Pt doing very well, with much less pain than she has had in the past. She has improving strength in Legs, improved ROM in neck. She is doing very well with ther ex in sessions, and overall doing more activities with improved ability and less pain. She still requires motivation for HEP, discussed importance today, to continue her progression.She does have soreness at c/t junction, likley from kyphotic posture and muscle fatigue.  Pt to decrease frequency of appts, and likley d/c in next 1-2 visits.    PT Frequency 2x / week    PT Duration 8 weeks    PT Treatment/Interventions ADLs/Self Care Home Management;Cryotherapy;Electrical Stimulation;DME Instruction;Ultrasound;Iontophoresis 4mg /ml Dexamethasone;Moist Heat;Traction;Gait training;Stair training;Functional mobility training;Therapeutic activities;Therapeutic exercise;Balance training;Neuromuscular re-education;Patient/family education;Manual techniques;Passive range of motion;Dry needling;Taping;Joint Manipulations;Spinal Manipulations;Energy conservation;Vasopneumatic Device;Orthotic Fit/Training    PT Next Visit Plan assess response to PNE, QL stretch    PT Home Exercise Plan    Consulted and Agree with Plan of Care  Patient  Patient will benefit from skilled therapeutic intervention in order to improve the following deficits and impairments:  Abnormal gait, Pain, Improper body mechanics, Decreased mobility, Decreased activity tolerance, Decreased range of motion, Decreased endurance, Decreased strength, Impaired perceived functional ability, Impaired UE functional use, Impaired flexibility, Decreased safety awareness, Decreased balance  Visit Diagnosis: Cervicalgia  Muscle weakness (generalized)  Chronic right-sided low back pain without sciatica     Problem List Patient Active Problem List   Diagnosis Date Noted   Radiculopathy 08/17/2020   Rash 07/28/2020   Vitamin D deficiency 05/03/2020   Neuropathy 04/06/2020   S/P ECT (electroconvulsive therapy) 06/02/2019   Memory loss due to medical condition 06/02/2019   Intention tremor 06/02/2019   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   OSA on CPAP 01/26/2019   Long term use of drug 10/08/2018   Insomnia 10/08/2018   Anxiety 10/08/2018   Tremors of nervous system 10/08/2018   Acne 01/10/2018   Primary hypercoagulable state (HCC) 10/10/2017   Vitamin B12 deficiency 03/18/2017   Diffuse pain 02/22/2017   Morbid obesity (HCC) 06/22/2016   MDD (major depressive disorder), recurrent severe, without psychosis (HCC) 06/14/2016   Intentional drug overdose (HCC)    Diabetes mellitus type 2 in obese (HCC) 06/11/2016   Positive ANA (antinuclear antibody) 04/02/2016   Hyperparathyroidism (HCC) 01/09/2016   Snoring 09/20/2015   Cervical disc disorder with radiculopathy of cervical region 05/12/2015   Hepatic steatosis 04/18/2015   Achilles tendonitis 04/08/2012   PTE (pulmonary thromboembolism) (HCC) 10/10/2011   Affective disorder (HCC) 10/10/2011   AR (allergic rhinitis) 10/10/2011    Sedalia Muta, PT, DPT 10:39 AM  11/29/20    [facility] [address] [place], [place], [address] Phone: [phone]   Fax:  [phone]  Name: Deborah Soto MRN: [id number] Date of Birth: [birth year]

## 2020-11-30 ENCOUNTER — Other Ambulatory Visit: Payer: Self-pay | Admitting: *Deleted

## 2020-11-30 ENCOUNTER — Encounter: Payer: BC Managed Care – PPO | Admitting: Physical Therapy

## 2020-11-30 MED ORDER — FREESTYLE LIBRE READER DEVI: 2 refills | Status: DC

## 2020-11-30 MED ORDER — FREESTYLE LIBRE 2 SENSOR MISC: 1 refills | Status: DC

## 2020-11-30 NOTE — Telephone Encounter (Signed)
Rx send to pharmacy  

## 2020-12-01 DIAGNOSIS — F332 Major depressive disorder, recurrent severe without psychotic features: Secondary | ICD-10-CM | POA: Diagnosis not present

## 2020-12-05 ENCOUNTER — Encounter: Payer: BC Managed Care – PPO | Admitting: Physical Therapy

## 2020-12-05 DIAGNOSIS — F332 Major depressive disorder, recurrent severe without psychotic features: Secondary | ICD-10-CM | POA: Diagnosis not present

## 2020-12-07 ENCOUNTER — Ambulatory Visit (INDEPENDENT_AMBULATORY_CARE_PROVIDER_SITE_OTHER): Payer: BC Managed Care – PPO | Admitting: Physical Therapy

## 2020-12-07 ENCOUNTER — Other Ambulatory Visit: Payer: Self-pay | Admitting: Psychiatry

## 2020-12-07 ENCOUNTER — Encounter: Payer: Self-pay | Admitting: Physical Therapy

## 2020-12-07 DIAGNOSIS — M542 Cervicalgia: Secondary | ICD-10-CM

## 2020-12-07 DIAGNOSIS — M6281 Muscle weakness (generalized): Secondary | ICD-10-CM

## 2020-12-07 DIAGNOSIS — F332 Major depressive disorder, recurrent severe without psychotic features: Secondary | ICD-10-CM

## 2020-12-07 DIAGNOSIS — M545 Low back pain, unspecified: Secondary | ICD-10-CM | POA: Diagnosis not present

## 2020-12-07 DIAGNOSIS — G8929 Other chronic pain: Secondary | ICD-10-CM

## 2020-12-07 NOTE — Therapy (Signed)
[facility] [address] [place], [place], [address] Phone: [phone]   Fax:  [phone]  Physical Therapy Treatment/Re-Cert/Discharge   Patient Details  Name: Deborah Soto MRN: [id number] Date of Birth: [birth year] Referring Provider (PT): Dimas Chyle, Mark  Re-cert for todays visit/to extend POC date.    Encounter Date: 12/07/2020   PT End of Session - 12/07/20 1202     Visit Number 11    Number of Visits 16    Date for PT Re-Evaluation 12/07/20    Authorization Type BCBS    PT Start Time 1017    PT Stop Time 1053    PT Time Calculation (min) 36 min    Activity Tolerance Patient tolerated treatment well    Behavior During Therapy WFL for tasks assessed/performed             Past Medical History:  Diagnosis Date   Anemia    Anxiety    Asthma    Depression    Diabetes mellitus type 2 in obese ([place]) 06/11/2016   Dyspnea    Embolism - blood clot January 1997 & January 2017   Also had LLE DVT in 1997   History of kidney stones    Hypertension    Preeclampsia 10/10/2011   1999    Sleep apnea     Past Surgical History:  Procedure Laterality Date   ANTERIOR CERVICAL DECOMPRESSION/DISCECTOMY FUSION 4 LEVELS N/A 08/17/2020   Procedure: ANTERIOR CERVICAL DECOMPRESSION FUSION CERVICAL THREE - CERVICAL FOUR, CERVICAL FOUR- CERVICAL FIVE, CERVICAL FIVE- CERVICAL SIX WITH INSTRUMENTATION AND ALLOGRAFT;  Surgeon: Phylliss Bob, MD;  Location: [facility];  Service: Orthopedics;  Laterality: N/A;   KIDNEY STONE SURGERY      There were no vitals filed for this visit.   Subjective Assessment - 12/07/20 1200     Subjective Pt with no new complaints. CT junction area still sore at times. She is doing better with HEP    Currently in Pain? Yes    Pain Score 2     Pain Location Neck    Pain Orientation Lower    Pain Descriptors / Indicators Sore    Pain Type Chronic pain;Acute pain    Pain Onset More than a month ago    Pain  Frequency Intermittent                [facility] PT Assessment - 12/07/20 0001       AROM   Overall AROM Comments Cervical: WFL      Strength   Overall Strength Comments UE: 4+/5 gross;  LE: Hips: 4+5, Knees: 5/5;      Ambulation/Gait   Gait Comments stairs: WNL, able to perform step over step without UE support.      Balance   Balance Assessed --   Balance with functional activity and stairs: [facility]                          [facility] Adult PT Treatment/Exercise - 12/07/20 0001       Neck Exercises: Theraband   Rows 20 reps;Green    Shoulder External Rotation 20 reps    Shoulder External Rotation Limitations GTB      Neck Exercises: Standing   Neck Retraction 10 reps    Other Standing Exercises Wall push ups x 20;      Neck Exercises: Seated   Cervical Rotation --      Neck Exercises: Supine  Other Supine Exercise --      Lumbar Exercises: Stretches   Single Knee to Chest Stretch 2 reps;30 seconds;Right;Left    Lower Trunk Rotation Limitations x15      Lumbar Exercises: Standing   Functional Squats 10 reps    Functional Squats Limitations mild knee pain    Other Standing Lumbar Exercises Stairs: no UE , 5 steps x 5; recipricol      Lumbar Exercises: Seated   Sit to Stand 10 reps      Lumbar Exercises: Supine   Bridge 20 reps    Straight Leg Raise 20 reps    Straight Leg Raises Limitations with cues for TA                     PT Education - 12/07/20 1202     Education Details Final HEP and importance reviewed today in detail    Person(s) Educated Patient    Methods Explanation;Demonstration;Tactile cues;Verbal cues;Handout    Comprehension Verbalized understanding;Returned demonstration;Verbal cues required;Tactile cues required;Need further instruction              PT Short Term Goals - 12/07/20 1204       PT SHORT TERM GOAL #1   Title Pt to be independent wtih initial HEP    Time 2    Period Weeks    Status Achieved     Target Date 10/19/20               PT Long Term Goals - 12/07/20 1204       PT LONG TERM GOAL #1   Title Pt to be independent with final HEP    Time 8    Period Weeks    Status Achieved      PT LONG TERM GOAL #2   Title Pt to report decreased pain in neck and shoulders to 0-2/10 with ROM and UE activities, to improve ability for ADLS and IADLS.    Time 8    Period Weeks    Status Achieved      PT LONG TERM GOAL #3   Title Pt to demo improved grip strength by at least 10 lb bilaterally, to improve ability for lift, carry,IADLS and fine motor tasks    Time 8    Period Weeks    Status Achieved      PT LONG TERM GOAL #4   Title Pt to demo improved strength of bil LEs to at least 4+/5 to improve ability for transfers, stairs, and walking.    Time 8    Period Weeks    Status Achieved      PT LONG TERM GOAL #5   Title Pt to demo dynamic balance to be WNL (for pt age) with gait and stairs, to improve ability for community and functional activity, and to decrease risk for falls.    Time 8    Period Weeks    Status Achieved                   Plan - 12/07/20 1206     Clinical Impression Statement Pt doing much better with mobility at this time. She has increased her ability and confidence with functional activity very much since start of care. She has improved functional strength of LEs, with improved ability for transfers and stairs, as well as improved postural awareness and decreased overall pain. She continues to have mild soreness at base of neck, C/T junction. Continued education on  posture, head position and postural strengthening for kyphotic posture. Final HEP reviewed today. Pt needs encouragement for HEP, but is doing much better with this from start of care. She has met goals at this time, and is ready for d/c to HEP. Pt doing better functionally, with exercise and stairs than she has in a long time, per pt report.    PT Frequency 2x / week    PT Duration  8 weeks    PT Treatment/Interventions ADLs/Self Care Home Management;Cryotherapy;Electrical Stimulation;DME Instruction;Ultrasound;Iontophoresis 102m/ml Dexamethasone;Moist Heat;Traction;Gait training;Stair training;Functional mobility training;Therapeutic activities;Therapeutic exercise;Balance training;Neuromuscular re-education;Patient/family education;Manual techniques;Passive range of motion;Dry needling;Taping;Joint Manipulations;Spinal Manipulations;Energy conservation;Vasopneumatic Device;Orthotic Fit/Training    PT Next Visit Plan assess response to PNE, QL stretch    PT Home Exercise Plan [id number]   Consulted and Agree with Plan of Care Patient             Patient will benefit from skilled therapeutic intervention in order to improve the following deficits and impairments:  Abnormal gait, Pain, Improper body mechanics, Decreased mobility, Decreased activity tolerance, Decreased range of motion, Decreased endurance, Decreased strength, Impaired perceived functional ability, Impaired UE functional use, Impaired flexibility, Decreased safety awareness, Decreased balance  Visit Diagnosis: Muscle weakness (generalized)  Cervicalgia  Chronic right-sided low back pain without sciatica     Problem List Patient Active Problem List   Diagnosis Date Noted   Radiculopathy 08/17/2020   Rash 07/28/2020   Vitamin D deficiency 05/03/2020   Neuropathy 04/06/2020   S/P ECT (electroconvulsive therapy) 06/02/2019   Memory loss due to medical condition 06/02/2019   Intention tremor 06/02/2019   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 06/14/2016   Intentional drug overdose ([place]    Diabetes mellitus type 2 in obese ([place] 06/11/2016   Positive ANA (antinuclear antibody) 04/02/2016   Hyperparathyroidism ([place] 01/09/2016   Snoring 09/20/2015   Cervical disc disorder with radiculopathy of cervical region 05/12/2015   Hepatic steatosis 04/18/2015   Achilles tendonitis 04/08/2012   PTE (pulmonary thromboembolism) ([place] 10/10/2011   Affective disorder ([place] 10/10/2011   AR (allergic rhinitis) 10/10/2011   LLyndee Hensen PT, DPT 12:14 PM  12/07/20    [place] [address]Phone: [phone]  Fax:  [phone] Name: MRumaysa SabatinoMRN: [id number] of Birth: [birth year]  PHYSICAL THERAPY DISCHARGE SUMMARY  Visits from Start of Care: 11 Plan: Patient agrees to discharge.  Patient goals were  met. Patient is being discharged due to meeting the stated rehab goals.      LLyndee Hensen PT, DPT 12:14 PM  12/07/20

## 2020-12-08 DIAGNOSIS — F332 Major depressive disorder, recurrent severe without psychotic features: Secondary | ICD-10-CM | POA: Diagnosis not present

## 2020-12-12 ENCOUNTER — Encounter: Payer: BC Managed Care – PPO | Admitting: Physical Therapy

## 2020-12-12 ENCOUNTER — Telehealth: Payer: Self-pay

## 2020-12-13 ENCOUNTER — Telehealth: Payer: Self-pay | Admitting: *Deleted

## 2020-12-13 DIAGNOSIS — F332 Major depressive disorder, recurrent severe without psychotic features: Secondary | ICD-10-CM | POA: Diagnosis not present

## 2020-12-13 NOTE — Telephone Encounter (Signed)
Left message to return call to our office at their convenience.  

## 2020-12-13 NOTE — Telephone Encounter (Signed)
Spoke with patient, patient using coupon now, patient was informed Insurance called stating Rx Greggory Keen was denied and  we can do an appeal tomorrow

## 2020-12-13 NOTE — Telephone Encounter (Signed)
Please let patient know. She can try manufacturer coupon.  Deborah Soto. [name], MD 12/13/2020 4:10 PM

## 2020-12-13 NOTE — Telephone Encounter (Signed)
Key: [id number] - Rx #: [id number]  Drug Mounjaro 15MG /0.5ML pen-injectors Waiting for determination

## 2020-12-13 NOTE — Telephone Encounter (Signed)
Rx [employer] today Your request has been denied

## 2020-12-13 NOTE — Telephone Encounter (Signed)
PA Placed send last note and labs to Cover My med

## 2020-12-14 ENCOUNTER — Encounter: Payer: BC Managed Care – PPO | Admitting: Physical Therapy

## 2020-12-15 DIAGNOSIS — F332 Major depressive disorder, recurrent severe without psychotic features: Secondary | ICD-10-CM | POA: Diagnosis not present

## 2020-12-16 ENCOUNTER — Other Ambulatory Visit: Payer: Self-pay | Admitting: Family Medicine

## 2020-12-16 ENCOUNTER — Other Ambulatory Visit: Payer: Self-pay | Admitting: Psychiatry

## 2020-12-16 DIAGNOSIS — M797 Fibromyalgia: Secondary | ICD-10-CM

## 2020-12-16 DIAGNOSIS — F332 Major depressive disorder, recurrent severe without psychotic features: Secondary | ICD-10-CM

## 2020-12-16 DIAGNOSIS — F411 Generalized anxiety disorder: Secondary | ICD-10-CM

## 2020-12-16 NOTE — Telephone Encounter (Signed)
Apparently PA needed for Viibryd

## 2020-12-16 NOTE — Telephone Encounter (Signed)
Pt called and advised that she needs a refill on Viibryd, but has been told a Prior Auth is needed.     [facility] [place], [place] - [address]  [address] [place] [address]  Phone:  [phone]  Fax:  [phone]

## 2020-12-16 NOTE — Telephone Encounter (Signed)
Next appt 10/21.  She has enough meds until Sun.

## 2020-12-20 DIAGNOSIS — F332 Major depressive disorder, recurrent severe without psychotic features: Secondary | ICD-10-CM | POA: Diagnosis not present

## 2020-12-20 NOTE — Telephone Encounter (Signed)
Correct. PA pending for [facility]

## 2020-12-21 ENCOUNTER — Telehealth: Payer: Self-pay

## 2020-12-21 NOTE — Telephone Encounter (Signed)
Prior authorization submitted and approved fro VIIBRYD 40 mg 1.5 tabs daily effective 12/20/2020-12/19/2021 with BCBS of [place] Ref# [id number]

## 2020-12-22 DIAGNOSIS — F332 Major depressive disorder, recurrent severe without psychotic features: Secondary | ICD-10-CM | POA: Diagnosis not present

## 2020-12-23 ENCOUNTER — Encounter: Payer: Self-pay | Admitting: Family Medicine

## 2020-12-23 ENCOUNTER — Other Ambulatory Visit: Payer: Self-pay

## 2020-12-23 ENCOUNTER — Ambulatory Visit (INDEPENDENT_AMBULATORY_CARE_PROVIDER_SITE_OTHER): Payer: BC Managed Care – PPO | Admitting: Family Medicine

## 2020-12-23 VITALS — BP 111/74 | HR 92 | Temp 97.3°F | Ht 68.0 in | Wt 246.6 lb

## 2020-12-23 DIAGNOSIS — E538 Deficiency of other specified B group vitamins: Secondary | ICD-10-CM | POA: Diagnosis not present

## 2020-12-23 DIAGNOSIS — E669 Obesity, unspecified: Secondary | ICD-10-CM | POA: Diagnosis not present

## 2020-12-23 DIAGNOSIS — E1169 Type 2 diabetes mellitus with other specified complication: Secondary | ICD-10-CM

## 2020-12-23 DIAGNOSIS — E559 Vitamin D deficiency, unspecified: Secondary | ICD-10-CM

## 2020-12-23 LAB — POCT GLYCOSYLATED HEMOGLOBIN (HGB A1C): Hemoglobin A1C: 6.8 % — AB (ref 4.0–5.6)

## 2020-12-23 LAB — VITAMIN B12: Vitamin B-12: 519 pg/mL (ref 211–911)

## 2020-12-23 LAB — HM DIABETES EYE EXAM

## 2020-12-23 LAB — VITAMIN D 25 HYDROXY (VIT D DEFICIENCY, FRACTURES): VITD: 31.38 ng/mL (ref 30.00–100.00)

## 2020-12-23 MED ORDER — DEXCOM G6 RECEIVER DEVI: 99 refills | Status: DC

## 2020-12-23 MED ORDER — DEXCOM G6 TRANSMITTER MISC: 99 refills | Status: DC

## 2020-12-23 MED ORDER — SYNJARDY XR 12.5-1000 MG PO TB24: 2.0000 | ORAL_TABLET | Freq: Every day | ORAL | 5 refills | Status: DC

## 2020-12-23 MED ORDER — PEN NEEDLES 33G X 4 MM MISC: 1 refills | Status: DC

## 2020-12-23 MED ORDER — DEXCOM G6 SENSOR MISC: 99 refills | Status: DC

## 2020-12-23 NOTE — Patient Instructions (Signed)
It was very nice to see you today!  Your A1c is 6.8.  I am very happy with this number.  We will increase your dose of Synjardy to the full dose.  Please continue your current dose of Mounjaro.  Please work on decreasing your dose of tresiba by 1 to 2 units daily every morning that your fasting blood sugar is less than 150.  We will check your vitamin D and B12 today.  I will see back in 3 months.  Come back sooner if needed.  Take care, Dr [name]  PLEASE NOTE:  If you had any lab tests please let us know if you have not heard back within a few days. You may see your results on mychart before we have a chance to review them but we will give you a call once they are reviewed by [name]. If we ordered any referrals today, please let us know if you have not heard from their office within the next week.   Please try these tips to maintain a healthy lifestyle:  Eat at least 3 REAL meals and 1-2 snacks per day.  Aim for no more than 5 hours between eating.  If you eat breakfast, please do so within one hour of getting up.   Each meal should contain half fruits/vegetables, one quarter protein, and one quarter carbs (no bigger than a computer mouse)  Cut down on sweet beverages. This includes juice, soda, and sweet tea.   Drink at least 1 glass of water with each meal and aim for at least 8 glasses per day  Exercise at least 150 minutes every week.

## 2020-12-23 NOTE — Progress Notes (Signed)
   Deborah Soto is a 55 y.o. female who presents today for an office visit.  Assessment/Plan:  Chronic Problems Addressed Today: Vitamin D deficiency Check Vit D.   Vitamin B12 deficiency She is having more paresthesias in her feet since switching to the months monthly B12 injection.  We will check B12 today.  Could consider doing oral supplementation set of injections.  Diabetes mellitus type 2 in obese (HCC) A1c 6.8. Congratulated patient. She is doing well with Mounjaro.  We will continue 50 mg weekly.  Tolerating well with no side effects.    We had a lengthy discussion regarding her diabetes management plan going forward. Her recent freestyle libre readings are likely not accurate. We will switch to dexcom monitoring system. We will increase dose of Synjardy to 12.07-998 twice daily.  We will work on decreasing her dose of insulin as tolerated.  She will wean down by 1-2 units daily every day that her fasting blood sugars at goal.  She will follow-up with me in 3 months to recheck A1c.  If she has difficulty getting Mounjaro in the future we will likely switch her to high dose liraglutide as this has worked well for him in the past.     Subjective:  HPI:   The medication Greggory Keen has been working well for her.  She denies experiencing side effects with taking Mounjaro. In the future there may be an issue with refills of the medication due to the expense if the pharmacy no longer takes the coupon that they have. She has been compliant with medications.   Also, she states experiencing a burning sensation on her feet. She admits to the condition becoming worse recently, and denies noticing a change in relation to vitamin B12 shots.  Lab Results  Component Value Date   HGBA1C 6.8 (A) 12/23/2020         Objective:  Physical Exam: BP 111/74   Pulse 92   Temp (!) 97.3 F (36.3 C) (Temporal)   Ht 5\' 8"  (1.727 m)   Wt 246 lb 9.6 oz (111.9 kg)   LMP  (LMP Unknown)  Comment: last period ---  1 year ago (?)  SpO2 96%   BMI 37.50 kg/m   Gen: No acute distress, resting comfortably Neuro: Grossly normal, moves all extremities Psych: Normal affect and thought content      I,Jordan Kelly,acting as a scribe for , MD.,have documented all relevant documentation on the behalf of Deborah Doe, MD,as directed by  Deborah Doe, MD while in the presence of Deborah Doe, MD.  I, Deborah Doe, MD, have reviewed all documentation for this visit. The documentation on 12/23/20 for the exam, diagnosis, procedures, and orders are all accurate and complete.  Time Spent: 50 minutes of total time was spent on the date of the encounter performing the following actions: chart review prior to seeing the patient, obtaining history, performing a medically necessary exam, counseling on the treatment plan including medication dose changes, placing orders, and documenting in our EHR.    12/25/20. Katina Degree, MD 12/23/2020 10:32 AM

## 2020-12-23 NOTE — Assessment & Plan Note (Signed)
Check Vit D 

## 2020-12-23 NOTE — Assessment & Plan Note (Addendum)
A1c 6.8. Congratulated patient. She is doing well with Mounjaro.  We will continue 50 mg weekly.  Tolerating well with no side effects.    We had a lengthy discussion regarding her diabetes management plan going forward. Her recent freestyle libre readings are likely not accurate. We will switch to dexcom monitoring system. We will increase dose of Synjardy to 12.07-998 twice daily.  We will work on decreasing her dose of insulin as tolerated.  She will wean down by 1-2 units daily every day that her fasting blood sugars at goal.  She will follow-up with me in 3 months to recheck A1c.  If she has difficulty getting Mounjaro in the future we will likely switch her to high dose liraglutide as this has worked well for him in the past.

## 2020-12-23 NOTE — Assessment & Plan Note (Signed)
She is having more paresthesias in her feet since switching to the months monthly B12 injection.  We will check B12 today.  Could consider doing oral supplementation set of injections.

## 2020-12-26 ENCOUNTER — Other Ambulatory Visit: Payer: Self-pay

## 2020-12-26 MED ORDER — CYANOCOBALAMIN 1000 MCG/ML IJ SOLN: INTRAMUSCULAR | 0 refills | Status: DC

## 2020-12-26 NOTE — Progress Notes (Signed)
Please inform patient of the following:  Vitamin D and B12 are both within the normal ranges. Would like for her to continue current treatment plan and we can recheck these in 6-12 months.  Deborah Soto. [name], MD 12/26/2020 2:01 PM

## 2020-12-27 DIAGNOSIS — F332 Major depressive disorder, recurrent severe without psychotic features: Secondary | ICD-10-CM | POA: Diagnosis not present

## 2020-12-29 DIAGNOSIS — F332 Major depressive disorder, recurrent severe without psychotic features: Secondary | ICD-10-CM | POA: Diagnosis not present

## 2020-12-30 ENCOUNTER — Other Ambulatory Visit: Payer: Self-pay

## 2020-12-30 ENCOUNTER — Ambulatory Visit (INDEPENDENT_AMBULATORY_CARE_PROVIDER_SITE_OTHER): Payer: BC Managed Care – PPO | Admitting: Psychiatry

## 2020-12-30 ENCOUNTER — Encounter: Payer: Self-pay | Admitting: Psychiatry

## 2020-12-30 DIAGNOSIS — Z79899 Other long term (current) drug therapy: Secondary | ICD-10-CM

## 2020-12-30 DIAGNOSIS — G4710 Hypersomnia, unspecified: Secondary | ICD-10-CM

## 2020-12-30 DIAGNOSIS — F411 Generalized anxiety disorder: Secondary | ICD-10-CM

## 2020-12-30 DIAGNOSIS — G4733 Obstructive sleep apnea (adult) (pediatric): Secondary | ICD-10-CM

## 2020-12-30 DIAGNOSIS — R52 Pain, unspecified: Secondary | ICD-10-CM

## 2020-12-30 DIAGNOSIS — F332 Major depressive disorder, recurrent severe without psychotic features: Secondary | ICD-10-CM

## 2020-12-30 DIAGNOSIS — R7989 Other specified abnormal findings of blood chemistry: Secondary | ICD-10-CM

## 2020-12-30 MED ORDER — VITAMIN D (ERGOCALCIFEROL) 1.25 MG (50000 UNIT) PO CAPS: 50000.0000 [IU] | ORAL_CAPSULE | ORAL | 3 refills | Status: DC

## 2020-12-30 MED ORDER — LITHIUM CARBONATE ER 450 MG PO TBCR: 900.0000 mg | EXTENDED_RELEASE_TABLET | Freq: Every evening | ORAL | 0 refills | Status: DC

## 2020-12-30 NOTE — Progress Notes (Signed)
Deborah Soto [id number] May 15, 1965 55 y.o.     Subjective:   Patient ID:  Deborah Soto is a 55 y.o. (DOB 12/07/1965) female.  Chief Complaint:  Chief Complaint  Patient presents with   Follow-up   Depression   Anxiety   Depression        Associated symptoms include fatigue and myalgias.  Associated symptoms include no decreased concentration.   Deborah Soto presents to the office today for follow-up of TRD and anxiety, and insomnia.  seen December 08, 2018.  She was complaining of lithium tremor and some cognitive problems.  Hydroxyzine was stopped.  Propranolol increased to 60 mg twice daily for tremor.  Lithium level and amitriptyline levels were requested. Amitriptyline level to 37, nortriptyline 49 on amitriptyline 250 mg nightly. Lithium level 0.800 mg daily  Phone call on October 6 to discuss lab results as follows: Note   ----- Message from [name]., MD sent at 12/15/2018 10:08 AM EDT ----- Lithium level stable at 0.8 in a good range.  The previous was 0.7.  She is having some tremor issues.   Normal BMP including excellent creatinine and calcium levels.  Blood sugar is high but she is aware of that problem.   TSH is within normal limits.   Serum amitriptyline level is 286 which is at the upper end of the normal range and suggestive that we not try further increase.  We will discuss further options at her follow-up appointment.   No medication adjustments required, but because of her tremor and her lithium level is slightly higher than it was with the prior level if she wants to reduce the lithium from 3 of the 300 mg tablets daily to 2-1/2 of the 300 mg tablets daily she can do so.  At her last appointment she was also encouraged to try a higher dose of propranolol and that may have resolved the problem.  If it did do not reduce the lithium but if it did not and she wants to reduce the dose she can do so as noted.  She should call us if she has any  recurrence of symptoms.   Deborah Staggers MD, [name]     She had repeat lithium level at 750 mg daily of 0.  7 with some improvement in tremor.  In phone call on November 20 she reported mood was worse with weepiness, overreacting, "pity party".  Because mood symptoms were worse with the reduction lithium she was encouraged to increase the dosage back to 900 mg daily.   Last seen February 04, 2019.  The following changes were made: Increase propranolol to 80 mg twice daily for lithium tremor and anxiety her blood pressure and pulse appear high enough that she can tolerate this dosage.  Disc Se.  Disc risk with low BP and DM.  He has not been having any problems with low blood sugar. Amitriptyline 6 or 300 mg daily if tolerated.  Try to take some about 4 -5 hours before sleep Try to take lithium also 4-5 hours before sleep to minimize daytime tremor Add methylphenidate ER 27 mg in the morning.  If no response we will increase the dosage.  Seen May 08, 2019 with her husband. Aside from physically feeling like she can't do much then having memory issues.  Forgetful.  Loses track of thoughts between tasks.  Most embarrassing being around people and word-finding issues.   Problems with walking bc balance and weakness.  Fears falling. Only fall at  Xmas morning.  Tremor is awful.  Affects keying.   H notices cognitive problems and forgetfulness.    Shaking got really bad and got lithium level as low as 600 mg but the next day felt really helpless and everything seemed to be aimed at hurting me and felt disrespected.  Problems with boss and 55 yo taking 5 AP classes and 2 volleyball classes.  I've been on her to accomplish all these things.  She claimed that pt screaming at her.   H CO she was more irritable after the reduction in lithium.   H CO her anxiety also seems worse.  H says the last year has been unhealthy with 12 hour work days for her.   Doesn't go out much.    Attention problems more  noticeable.  Still dropping and tremor.  Tolerated the increase in propranolol.    CO shakiness from lithium which interferes with typing. Also a good bit of anxiety generally without panic.  BS are high.  NotTaking hydroxyzine.   sleeping better and still using CPAP.  Using CPAP regularly. Less awakening than before.  No SE. depression was worse after reducing the lithium.  Irritability was also worse after reducing the lithium to 600 mg daily.  seen May 08, 2019.  Multiple changes made:  Reduce propranolol to 60 mg twice daily for lithium tremor and anxiety since the increase was not helpful.   Restart B6 for lithium tremor. 500 mg BID. She forgot and doesn't want more. Try to take lithium also 4-5 hours before sleep to minimize daytime tremor. depression was worse after reducing the lithium.  Irritability was also worse after reducing the lithium to 600 mg daily. So reluctant to reduce it further.  It is unclear how to address the benefits of lithium without using lithium other than to consider Spravato  Increase methylphenidate ER 36 mg in the morning. Increase methylphenidate ER 36 mg in the morning.  Wait 1-2 weeks then reduce amitriptyline to 5 tablets nightly to see if cognition is better. Also saw Dr. [name] re: sleep disorder.  05/2019 appt with the following noted: Lost Rx of Concerta 36 so didn't take it long.  Out of it.  Still shakey and cognitive problems.  Dep 5/10/  Anxiety 6/10 worse at night.  No SI.  8 hours sleep.  Mild panic couple times daily. Frustrated with tremor and sleepiness which is worse than tiredness.   History of low vitamin D taking 150K/week but stopped 6 mos ago. Plan : Modafinil 100 mg tablet 1 dailly for 1 week, Then add Concerta 36 mg 1 each AM.  Option increase modafinil mid April  Restart B6 for lithium tremor. 500 mg BID. Get lab test at earliest convience  10/14/2019 appointment with the following noted: Has been on amitriptyline 250 mg for  mos.  Tried a week ago reducing to 200 and felt worse so back on 250. Weaned off Lyrica about a month ago.  Not much change off it.   Not sure if it helped energy or alertness. Saw Dr. Cardell Peach in consultation. Mental clarity is a lot better with stimulants and not sleeping as much in daytime with current meds.  Severe drowsiness is a lot better.   Off metformin and added Glipizide.   Not great with depression 5/10.  Anxiety can trigger problems with thinking and somatic sx   I feel like it's something physical and can keep her up at night. Occ racing heart or feeling hot.  Caffeine variable.  Plan:Plan: Increase methylphenidate from 36 to 54 mg in the morning. Increase amitriptyline to 6 tablets in the evening    11/12/19 TC noting call: Pt called stated she's not feeling well at all. Ask for sooner apt than 10/7. Depression, anxiety & focus is much worse. Has not planned to hurt herself but thoughts of things would be better if I was not here have crossed her mind often. Contact ASAP @ [phone].  I put Pt on canc list  MD response: I reviewed the meds and previous med lists.  The next options are either Spravato or a major med change from amitriptyline which is not something I can do outside of an appt.  She's on cancellation list and we'll try to work her in sooner.  Nothing I can change right now except since the increase in amitriptyline has not helped, she can reduce it to 200 mg nightly in preparation for other changes to come. Nursing response after talking with pt: Discussed symptoms and medications with patient, she did not increase her Amitriptyline 50 mg up to 6 tablets so she will start that today. Said you may have told her that but she didn't remember. She has read about Spravato but she is not wanting to proceed with that. Informed her she's on cancellation list as well. She will call back with worsening signs or symptoms.  11/23/19 appt with the following noted: Forgot to increase  amitriptyline until noted. Tolerating it OK but hasn't had time to help. More alert with Concerta increase to 54 mg daily also with modafinil.  A little more focused and better productivity.  Still forgetful.   No SE noted except GI issues from diarrhea to constipation.   Still miserable and cry easily.  Try to numb herself.  Always tired especially emotionally.  Anxiety is high and predates current stimulants. No therapy since [facility].  She felt he pushed too hard for her to make changes.  I don't feel like I can deal with him right now. Plan: Continue meds & The increase in amitriptyline to 300 mg daily needs more time.  01/12/2020 appointment with the following noted: Not well.  Mood horrible.  Easily frustrated sometimes without reason and may nap or cry.  Anxiety is pretty bad.  Daytime sleepiness seems worse.  Can get nausea from anxiety.  Feels like function is slow.  Have to write everything down.  Hates home, job, high school volleyball program.  At some point I have to take responsibility.   Amitriptyline also not helping pain any.  Clenching jaw for 3 weeks. Likcking lips.  Picking fingers. D will play volleyball at [employer] and [place]. Plan: Reduce amitriptyline by 1 tablet every 4 days. Start Viibryd 10 mg daily with food for 7 days, then 20 mg daily for 7 days, then increase to 40 mg daily.  03/18/20 appt urgently made and noted:  Seen with husband. Still with jaw clenching day and night despite off stimulant. Now on Viibryd 60 mg since 03/02/20 and no stimulant., lithium 900 mg, and propranolol 80 BID. Rare hydroxyzine DT sleepiness, Gabapentin 300 TID. Not taking it with food.  LT sensitivity to noise and light and overstimulated and now bothering her more at work.  Always had to use shirts without tags and sensitivity to smells. Easily agitated in public per H.  Gets negative and wants to give up and not live anymore.  She says there's less chance of acting on it if she tells  H about it and she's doing so now.  Pain worse off amitriptyline esp around ribs and general diffuse pain.  Anxiety worse with stimulants and hasn't gone away.  Trouble staying asleep. Plan: Need to take Viibryd 60 with food emphasized.  She's not currently doing so. Restart low-dose amitriptyline for pain  04/22/2020 appointment with following noted: Only restarted amitriptyline 50 mg last week.  Stays asleep better with it.  Sleep 8-9 hours now but no change in pain yet.  Sleepier in day recently. Done a lot of travel lately. Maybe viibryd making a little difference with mood and anxiety now that has been on it longer.  Needs to take Viibryd in AM bc seems to interfere with sleep. Final season of travel volleyball with good team. H notes still some irritability. Pain worse off amitriptyline.  Can reduce to  amitriptyline 25 for pain if 50 mg remains too sedating.  Is sleeping better.      06/21/2020 appointment with following noted:  Seen with H Tolerating amitriptyline 50 mg HS with decent sleep. Really crappy and as bad as 10 years.  Situationally life sucks.  Work environment is bad with coworkers.  Since Sept work is bad.  Doesn't feel people are supportive at home or work. H working 12 hour days.  When she's off wants to sleep all day.   Still in therapy with Thayer Ohm.  Is good therapist. 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  Check lithium level Referred to Dr. Evelene Croon for [facility]  11/03/2020 appointment with the following noted: 55 yo D got Covid but is OK. Still doing Spravato 84 mg twice weekly and plans to continue for awhile. And for a total of 3 mos.  Tolerated well.  It's a nice feeling.   Didn't seem to help the depression for a couple of months.   It does feel different. SI has resolved lately which is particularly impressive given she  has a lot of free time. She has lot s of time with nothing to do bc quit her job. Job environment got worse and worse.  Had been there 9 years.   Doing PT for pain in her back.  Ongoing physical issues.   Mood clearly better with Spravato but not resolved.   Neck fusion recently. Plan: check lithium level Reduce olanzapine to 5 mg HS  12/30/2020 appointment with the following noted: Surges of anxious feelings in her chest without heart racing several times daily last 15 mins and without pcpt.  Stay a little on edge all the time.   Left job and too much time on her hands. 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  She's doing well statistically.  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 Spravato twice weekly.  Is better with it but still some mild cycling of depression.  Still upset over leaving work.  SI very rare and fleeting.  Had a really good plan but doesn't dwell on the plan. Lithium level 0.7 on 900 mg HS. Reduced olanzapine to 5 mg HS and has felt a little worse. Viibryd 60 mg daily. Amitrptyline 50 for pain.  Not sure if it helps.  PT helped a lot with tension in neck and down spine. Sleeping pretty well after 8 hours.   Past Psychiatric Medication Trials: Failed ECT and TMS,  amitriptyline helped for 2 to 3 years in  1987,   2021 No benefit from amitriptyline 300 mg with adequate duration.  Nortriptyline 200 with a therapeutic blood level. Trintellix, Emsam, duloxetine 120, Lexapro 20, Fetzima,  Wellbutrin, fluoxetine, paroxetine, sertraline,  Viibryd 60 lithium SE,  Latuda 120, Abilify 15,  Vraylar, Rexulti,Seroquel 400, risperidone, Geodon, Haldol for agitation, lamotrigine 300,  buspirone, clonazepam, gabapentin, modafinil, pramipexole,  Nuvigil, Vyvanse, Concerta SHE DOESN'T WANT STIMULANTS AGAIN bc of anxiety. Trazodone, propranolol history of overdose on Xanax, There is a history of suicide attempts and psychiatric hospitalizations.  Review of Systems:  Review of  Systems  Constitutional:  Positive for fatigue.  Cardiovascular:  Negative for chest pain and palpitations.  Gastrointestinal:  Negative for diarrhea.  Musculoskeletal:  Positive for back pain and myalgias. Negative for gait problem.  Neurological:  Positive for tremors and weakness. Negative for dizziness.  Psychiatric/Behavioral:  Negative for confusion, decreased concentration, hallucinations and self-injury. The patient is nervous/anxious.    Medications: I have reviewed the patient's current medications.  Current Outpatient Medications  Medication Sig Dispense Refill   amitriptyline (ELAVIL) 50 MG tablet Take 1 tablet (50 mg total) by mouth at bedtime. 90 tablet 0   ampicillin (PRINCIPEN) 500 MG capsule Take 500 mg by mouth daily.     celecoxib (CELEBREX) 100 MG capsule Take 100 mg by mouth 2 (two) times daily.     Cholecalciferol (VITAMIN D) 50 MCG (2000 UT) tablet Take 2,000 Units by mouth daily.     Continuous Blood Gluc Receiver (DEXCOM G6 RECEIVER) DEVI Use 4 times daily as needed to check blood sugar 1 each PRN   cyanocobalamin (,VITAMIN B-12,) 1000 MCG/ML injection One Injection weekly for 4 weeks then one injection monthly for 3 month 7 mL 0   Empagliflozin-metFORMIN HCl ER (SYNJARDY XR) 25-1000 MG TB24 Take 1 tablet by mouth daily. 30 tablet 5   Esketamine HCl, 84 MG Dose, (SPRAVATO, 84 MG DOSE,) 28 MG/DEVICE SOPK Place 84 mg into the nose See admin instructions. Use twice weekly for 4 weeks then reduce to once weekly     gabapentin (NEURONTIN) 300 MG capsule Take 2 capsules in the morning, 1 in the middle of the day, and 2 at night. (Patient taking differently: Take 2 capsules in the morning and 2 at night.) 150 capsule 3   Insulin Pen Needle (PEN NEEDLES) 33G X 4 MM MISC Use daily as needed to inject insulin 100 each 1   Multiple Vitamin (MULTIVITAMIN WITH MINERALS) TABS tablet Take 1 tablet by mouth daily.     OLANZapine (ZYPREXA) 10 MG tablet 1/2 to 1 tablet daily as needed  for sleep, anxiety, depression or agitation 30 tablet 0   pantoprazole (PROTONIX) 40 MG tablet TAKE ONE TABLET BY MOUTH DAILY FOR STOMACH PROTECTION. 30 tablet 0   propranolol (INDERAL) 40 MG tablet take ONE TO TWO tablets every EIGHT hours FOR anxiety 150 tablet 0   rivaroxaban (XARELTO) 20 MG TABS tablet Take 1 tablet (20 mg total) by mouth daily with supper. 30 tablet 5   tirzepatide (MOUNJARO) 15 MG/0.5ML Pen Inject 15 mg into the skin once a week. 6 mL 1   TRESIBA FLEXTOUCH 100 UNIT/ML FlexTouch Pen Inject 80 Units into the skin at bedtime. 24 mL 2   tretinoin (RETIN-A) 0.1 % cream Apply 1 application topically at bedtime.     VIIBRYD 40 MG TABS TAKE 1 & 1/2 TABLETS BY MOUTH DAILY. 45 tablet 1   Vitamin D, Ergocalciferol, (DRISDOL) 1.25 MG (50000 UNIT) CAPS capsule Take 1 capsule (  50,000 Units total) by mouth every 7 (seven) days. 15 capsule 3   lithium carbonate (ESKALITH) 450 MG CR tablet Take 2 tablets (900 mg total) by mouth at bedtime. 180 tablet 0   No current facility-administered medications for this visit.    Medication Side Effects: Sedation  Allergies:  Allergies  Allergen Reactions   Tramadol Other (See Comments)    Tingling all over    Hydrocodone     Ineffective     Past Medical History:  Diagnosis Date   Anemia    Anxiety    Asthma    Depression    Diabetes mellitus type 2 in obese (HCC) 06/11/2016   Dyspnea    Embolism - blood clot January 1997 & January 2017   Also had LLE DVT in 1997   History of kidney stones    Hypertension    Preeclampsia 10/10/2011   1999    Sleep apnea     Family History  Problem Relation Age of Onset   Dementia Father    Heart disease Father    Clotting disorder Mother    Arthritis Mother    Clotting disorder Sister    Clotting disorder Maternal Grandmother    Clotting disorder Maternal Aunt    Rheumatologic disease Neg Hx    Hyperparathyroidism Neg Hx     Social History   Socioeconomic History   Marital status:  Married    Spouse name: Not on file   Number of children: Y   Years of education: Not on file   Highest education level: Not on file  Occupational History   Occupation: admin assist    Comment: insurance verification  Tobacco Use   Smoking status: Former    Packs/day: 1.00    Years: 20.00    Pack years: 20.00    Types: Cigarettes    Quit date: 03/12/2004    Years since quitting: 16.8   Smokeless tobacco: Never  Vaping Use   Vaping Use: Never used  Substance and Sexual Activity   Alcohol use: Yes    Alcohol/week: 1.0 standard drink    Types: 1 Cans of beer per week    Comment: 1-2 times a week   Drug use: No   Sexual activity: Yes    Partners: Male    Birth control/protection: None  Other Topics Concern   Not on file  Social History Narrative   Originally from [place]. Always lived in [place]. 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 No international travel recently. Previously has been to [place] in May 1996. No mold exposure recently. No bird exposure. Does have multiple pets.    Social Determinants of Health   Financial Resource Strain: Not on file  Food Insecurity: Not on file  Transportation Needs: Not on file  Physical Activity: Not on file  Stress: Not on file  Social Connections: Not on file  Intimate Partner Violence: Not on file    Past Medical History, Surgical history, Social history, and Family history were reviewed and updated as appropriate.   Please see review of systems for further details on the patient's review from today.   Objective:   Physical Exam:  LMP  (LMP Unknown) Comment: last period ---  1 year ago (?)  Physical Exam Constitutional:      General: She is not in acute distress. Musculoskeletal:        General: No deformity.  Neurological:     Mental Status: She is alert and oriented to person, place,  and time.     Cranial Nerves: No dysarthria.     Coordination: Coordination normal.  Psychiatric:        Attention and Perception:  Attention and perception normal. She does not perceive auditory or visual hallucinations.        Mood and Affect: Mood is anxious and depressed. Affect is not labile, blunt, angry, tearful or inappropriate.        Speech: Speech normal.        Behavior: Behavior normal. Behavior is cooperative.        Thought Content: Thought content normal. Thought content is not paranoid or delusional. Thought content does not include homicidal or suicidal ideation. Thought content does not include homicidal or suicidal plan.        Cognition and Memory: Cognition and memory normal.        Judgment: Judgment normal.     Comments: Insight intact Depression no better    Lab Review:     Component Value Date/Time   NA 140 11/22/2020 1223   NA 138 08/21/2019 0000   NA 140 12/11/2016 1236   K 4.1 11/22/2020 1223   K 4.2 12/11/2016 1236   CL 104 11/22/2020 1223   CO2 26 11/22/2020 1223   CO2 24 12/11/2016 1236   GLUCOSE 151 (H) 11/22/2020 1223   GLUCOSE 109 12/11/2016 1236   BUN 8 11/22/2020 1223   BUN 12 08/21/2019 0000   BUN 15.1 12/11/2016 1236   CREATININE 0.66 11/22/2020 1223   CREATININE 0.68 02/22/2020 1649   CREATININE 0.8 12/11/2016 1236   CALCIUM 9.6 11/22/2020 1223   CALCIUM 10.0 12/11/2016 1236   PROT 6.9 11/22/2020 1223   PROT 7.5 12/11/2016 1236   ALBUMIN 4.3 11/22/2020 1223   ALBUMIN 4.4 12/11/2016 1236   AST 40 (H) 11/22/2020 1223   AST 38 (H) 12/11/2016 1236   ALT 40 (H) 11/22/2020 1223   ALT 61 (H) 12/11/2016 1236   ALKPHOS 65 11/22/2020 1223   ALKPHOS 71 12/11/2016 1236   BILITOT 0.7 11/22/2020 1223   BILITOT 0.62 12/11/2016 1236   GFRNONAA >60 08/16/2020 1157   GFRNONAA 105 12/11/2018 1121   GFRAA 122 12/11/2018 1121       Component Value Date/Time   WBC 7.4 11/22/2020 1223   RBC 4.88 11/22/2020 1223   HGB 13.1 11/22/2020 1223   HGB 14.5 12/11/2016 1236   HCT 41.4 11/22/2020 1223   HCT 43.9 12/11/2016 1236   PLT 262.0 11/22/2020 1223   PLT 289 12/11/2016 1236    PLT 295 07/24/2016 1848   MCV 85.0 11/22/2020 1223   MCV 93.2 12/11/2016 1236   MCH 29.9 08/16/2020 1157   MCHC 31.6 11/22/2020 1223   RDW 16.4 (H) 11/22/2020 1223   RDW 13.5 12/11/2016 1236   LYMPHSABS 2.2 11/22/2020 1223   LYMPHSABS 2.4 12/11/2016 1236   MONOABS 0.6 11/22/2020 1223   MONOABS 0.7 12/11/2016 1236   EOSABS 0.1 11/22/2020 1223   EOSABS 0.7 (H) 12/11/2016 1236   BASOSABS 0.0 11/22/2020 1223   BASOSABS 0.0 12/11/2016 1236    Lithium Lvl  Date Value Ref Range Status  11/22/2020 0.7 0.6 - 1.2 mmol/L Final   11/22/20  lithium 0.7 on 900   Serum nortriptyline level on 150 mg a day was 109  Amitriptyline  Level 12/11/2018 =237  On 250 mg daily,.    No results found for: PHENYTOIN, PHENOBARB, VALPROATE, CBMZ   .res Assessment: Plan:    Severe recurrent major depression without psychotic  features (HCC) - Plan: lithium carbonate (ESKALITH) 450 MG CR tablet  Generalized anxiety disorder  Hypersomnolence  Obstructive sleep apnea  Diffuse pain  Low vitamin D level - Plan: Vitamin D, Ergocalciferol, (DRISDOL) 1.25 MG (50000 UNIT) CAPS capsule  Lithium use   Deborah Soto has chronic severe major depression and generalized anxiety that is treatment resistant and failed multiple medications ECT and TMS as noted above including SSRIs, try cyclic's, lithium, and atypical antipsychotics for depression.. She has had partial benefit from amitriptyline plus lithium.  However she is having tremor problems from the lithium.  Efforts to reduce the lithium have led to worsening psychiatric symptoms.  There has been a discussion about possible bipolar elements but she has no clear manic episodes unrelated to medication changes.  She has features of atypical depression.  So an MAO inhibitor is attractive from that perspective.  Discussed Spravato option in detail.  We will discuss it again at follow-up appointment.  She was given information to read in the interim and the treatment  plan was discussed in terms of frequency and side effects.  She is more open to this now.  Extensive discussion about procedure time commitment. Rec go for consult.  Discussed the process in detail of consultation with Dr. Evelene Croon. To go for consultation.  Increase olanzapine to 10 mg HS For anxiety and depression.  Nothing better after reducing it.  Therefore consideration could be given to other trials of MAO inhibitors as they are classically more effective than other antidepressants and atypical depression.  She did fail a trial of Emsam.  Consideration could be given to using tablet version of selegiline and going to much higher doses or the use of Parnate or Nardil.. Other option for TRD not tried is Symbyax but weight gain risk.  Option of adding olanzapine to current antidepressant but she reports she does not check her blood sugar very often and that would not be a good scenario with olanzapine.  Statistically speaking the most potentially beneficial option would be Parnate or Nardil but they are difficult to use  Consider Auvelity.  Need to take Viibryd 60 with food emphasized.  She's improving but not consistent.  check lithium level 0.7 on 900 mg.  continue  Rec counseling.  Last did April 2019 after a hospitalization. She's back seeing Kennon Rounds..  Encourage physical activity and weight loss.  She is not particularly motivated and feels that is hard to accomplish because of chronic pain.  Chronic pain complicates her treatment of depression.  Recent B12 deficiency dx and treatment just started. Increase vitamin D bc level 31 on 2000U daily  Hold B6 for lithium tremor. 500 mg BID. If needed.  Tremor is better.  None currently.  Try to take lithium also 4-5 hours before sleep to minimize daytime tremor. depression was worse after reducing the lithium.  Irritability was also worse after reducing the lithium to 600 mg daily.   Retry DC amitriptyline now that she's on  olanzapine..  Disc weith loss meds Monjarou recently started for DM and goals.  Follow-up 8 weeks  Deborah Staggers MD, [name]  Future Appointments  Date Time Provider Department Center  03/28/2021 10:00 AM Ardith Dark, MD [facility]    No orders of the defined types were placed in this encounter.     -------------------------------

## 2020-12-30 NOTE — Patient Instructions (Signed)
Increase vitamin D  Increase olanzapine back to 1 of the 10 mg tablets in evening  Wean off amitriptyline and reevaluate pain sx

## 2021-01-02 DIAGNOSIS — F332 Major depressive disorder, recurrent severe without psychotic features: Secondary | ICD-10-CM | POA: Diagnosis not present

## 2021-01-03 DIAGNOSIS — F332 Major depressive disorder, recurrent severe without psychotic features: Secondary | ICD-10-CM | POA: Diagnosis not present

## 2021-01-04 DIAGNOSIS — I10 Essential (primary) hypertension: Secondary | ICD-10-CM | POA: Diagnosis not present

## 2021-01-04 DIAGNOSIS — F332 Major depressive disorder, recurrent severe without psychotic features: Secondary | ICD-10-CM | POA: Diagnosis not present

## 2021-01-04 DIAGNOSIS — G4733 Obstructive sleep apnea (adult) (pediatric): Secondary | ICD-10-CM | POA: Diagnosis not present

## 2021-01-10 DIAGNOSIS — F332 Major depressive disorder, recurrent severe without psychotic features: Secondary | ICD-10-CM | POA: Diagnosis not present

## 2021-01-12 ENCOUNTER — Other Ambulatory Visit: Payer: Self-pay | Admitting: Family Medicine

## 2021-01-12 DIAGNOSIS — F332 Major depressive disorder, recurrent severe without psychotic features: Secondary | ICD-10-CM | POA: Diagnosis not present

## 2021-01-17 DIAGNOSIS — F332 Major depressive disorder, recurrent severe without psychotic features: Secondary | ICD-10-CM | POA: Diagnosis not present

## 2021-01-18 ENCOUNTER — Ambulatory Visit (INDEPENDENT_AMBULATORY_CARE_PROVIDER_SITE_OTHER): Payer: BC Managed Care – PPO | Admitting: Family Medicine

## 2021-01-18 ENCOUNTER — Encounter: Payer: Self-pay | Admitting: Family Medicine

## 2021-01-18 ENCOUNTER — Other Ambulatory Visit: Payer: Self-pay

## 2021-01-18 VITALS — BP 113/78 | HR 85 | Temp 97.7°F | Wt 245.5 lb

## 2021-01-18 DIAGNOSIS — E669 Obesity, unspecified: Secondary | ICD-10-CM

## 2021-01-18 DIAGNOSIS — E1169 Type 2 diabetes mellitus with other specified complication: Secondary | ICD-10-CM

## 2021-01-18 MED ORDER — TRESIBA FLEXTOUCH 100 UNIT/ML ~~LOC~~ SOPN: 68.0000 [IU] | PEN_INJECTOR | Freq: Every day | SUBCUTANEOUS | 2 refills | Status: DC

## 2021-01-18 NOTE — Progress Notes (Signed)
   Deborah Soto is a 55 y.o. female who presents today for an office visit.  Assessment/Plan:  New/Acute Problems: Ingrown Toenail No red flags.  No signs of infection.  Discussed conservative measures including warm saltwater soak, proper trimming techniques and using a cotton swab to lift the nail.  She will let me know if not improving and we can consider partial nail removal or referral to podiatry.  Chronic Problems Addressed Today: Diabetes mellitus type 2 in obese [facility]) She is doing well.  Sugar still in the 100s to low 200 range.  She is down to 68 units on her tresiba.  Last A1c 6.8.  Continue current regimen of Mounjaro15 mg weekly, synjardy 12.5-1000mg  twice daily.  We will recheck A1c again in a couple of months.  She will continue monitoring her glucoses at home and weaning down on insulin as tolerated.   Morbid obesity (HCC) Down 1 lb since last visit. Follow up in 2 months.      Subjective:  HPI:  Patient here with right great toenail pain.  Has been present for several weeks to months.  Located on bilateral nail folds.  She usually has her nails done via pedicure routinely.  She has had little bit of redness.  No swelling.  No purulent discharge.  See A/P for status of chronic conditions.       Objective:  Physical Exam: BP 113/78   Pulse 85   Temp 97.7 F (36.5 C) (Temporal)   Wt 245 lb 8 oz (111.4 kg)   LMP  (LMP Unknown) Comment: last period ---  1 year ago (?)  SpO2 94%   BMI 37.33 kg/m   Gen: No acute distress, resting comfortably MSK: Right great toe with ingrown toenail on bilateral nail folds.  No redness, drainage, or other signs of infection. Neuro: Grossly normal, moves all extremities Psych: Normal affect and thought content      Remedios Mckone M. [name], MD 01/18/2021 9:48 AM

## 2021-01-18 NOTE — Patient Instructions (Signed)
It was very nice to see you today!  You have a mildly ingrown toenail.  Please try soaking and warm water with Epson salt a couple times per day.  You can also try placing a small amount of a cotton swab or floss under the corners to help lift the nail up.  Let me know if your symptoms are not improving.  We will see back in a couple months.  Please come back sooner if needed.  Take care, Dr [name]  PLEASE NOTE:  If you had any lab tests please let us know if you have not heard back within a few days. You may see your results on mychart before we have a chance to review them but we will give you a call once they are reviewed by [name]. If we ordered any referrals today, please let us know if you have not heard from their office within the next week.   Please try these tips to maintain a healthy lifestyle:  Eat at least 3 REAL meals and 1-2 snacks per day.  Aim for no more than 5 hours between eating.  If you eat breakfast, please do so within one hour of getting up.   Each meal should contain half fruits/vegetables, one quarter protein, and one quarter carbs (no bigger than a computer mouse)  Cut down on sweet beverages. This includes juice, soda, and sweet tea.   Drink at least 1 glass of water with each meal and aim for at least 8 glasses per day  Exercise at least 150 minutes every week.    Ingrown Toenail An ingrown toenail occurs when the corner or sides of a toenail grow into the surrounding skin. This causes discomfort and pain. The big toe is most commonly affected, but any of the toes can be affected. If an ingrown toenail is not treated, it can become infected. What are the causes? This condition may be caused by: Wearing shoes that are too small or tight. An injury, such as stubbing your toe or having your toe stepped on. Improper cutting or care of your toenails. Having nail or foot abnormalities that were present from birth (congenital abnormalities), such as having a nail  that is too big for your toe. What increases the risk? The following factors may make you more likely to develop ingrown toenails: Age. Nails tend to get thicker with age, so ingrown nails are more common among older people. Cutting your toenails incorrectly, such as cutting them very short or cutting them unevenly. An ingrown toenail is more likely to get infected if you have: Diabetes. Blood flow (circulation) problems. What are the signs or symptoms? Symptoms of an ingrown toenail may include: Pain, soreness, or tenderness. Redness. Swelling. Hardening of the skin that surrounds the toenail. Signs that an ingrown toenail may be infected include: Fluid or pus. Symptoms that get worse. How is this diagnosed? Ingrown toenails may be diagnosed based on: Your symptoms and medical history. A physical exam. Labs or tests. If you have fluid or blood coming from your toenail, a sample may be collected to test for the specific type of bacteria that is causing the infection. How is this treated? Treatment depends on the severity of your symptoms. You may be able to care for your toenail at home. If you have an infection, you may be prescribed antibiotic medicines. If you have fluid or pus draining from your toenail, your health care provider may drain it. If you have trouble walking, you may  be given crutches to use. If you have a severe or infected ingrown toenail, you may need a procedure to remove part or all of the nail. Follow these instructions at home: Foot care  Check your wound every day for signs of infection, or as often as told by your health care provider. Check for: More redness, swelling, or pain. More fluid or blood. Warmth. Pus or a bad smell. Do not pick at your toenail or try to remove it yourself. Soak your foot in warm, soapy water. Do this for 20 minutes, 3 times a day, or as often as told by your health care provider. This helps to keep your toe clean and your skin  soft. Wear shoes that fit well and are not too tight. Your health care provider may recommend that you wear open-toed shoes while you heal. Trim your toenails regularly and carefully. Cut your toenails straight across to prevent injury to the skin at the corners of the toenail. Do not cut your nails in a curved shape. Keep your feet clean and dry to help prevent infection. General instructions Take over-the-counter and prescription medicines only as told by your health care provider. If you were prescribed an antibiotic, take it as told by your health care provider. Do not stop taking the antibiotic even if you start to feel better. If your health care provider told you to use crutches to help you move around, use them as instructed. Return to your normal activities as told by your health care provider. Ask your health care provider what activities are safe for you. Keep all follow-up visits. This is important. Contact a health care provider if: You have more redness, swelling, pain, or other symptoms that do not improve with treatment. You have fluid, blood, or pus coming from your toenail. You have a red streak on your skin that starts at your foot and spreads up your leg. You have a fever. Summary An ingrown toenail occurs when the corner or sides of a toenail grow into the surrounding skin. This causes discomfort and pain. The big toe is most commonly affected, but any of the toes can be affected. If an ingrown toenail is not treated, it can become infected. Fluid or pus draining from your toenail is a sign of infection. Your health care provider may need to drain it. You may be given antibiotics to treat the infection. Trimming your toenails regularly and properly can help you prevent an ingrown toenail. This information is not intended to replace advice given to you by your health care provider. Make sure you discuss any questions you have with your health care provider. Document Revised:  06/28/2020 Document Reviewed: 06/28/2020 Elsevier Patient Education  2022 [employer].

## 2021-01-18 NOTE — Assessment & Plan Note (Signed)
Down 1 lb since last visit. Follow up in 2 months.

## 2021-01-18 NOTE — Assessment & Plan Note (Addendum)
She is doing well.  Sugar still in the 100s to low 200 range.  She is down to 68 units on her tresiba.  Last A1c 6.8.  Continue current regimen of Mounjaro15 mg weekly, synjardy 12.5-1000mg  twice daily.  We will recheck A1c again in a couple of months.  She will continue monitoring her glucoses at home and weaning down on insulin as tolerated.

## 2021-01-19 ENCOUNTER — Other Ambulatory Visit: Payer: Self-pay | Admitting: Psychiatry

## 2021-01-19 DIAGNOSIS — F411 Generalized anxiety disorder: Secondary | ICD-10-CM

## 2021-01-19 DIAGNOSIS — F332 Major depressive disorder, recurrent severe without psychotic features: Secondary | ICD-10-CM | POA: Diagnosis not present

## 2021-01-20 ENCOUNTER — Telehealth: Payer: Self-pay | Admitting: Psychiatry

## 2021-01-20 ENCOUNTER — Other Ambulatory Visit: Payer: Self-pay | Admitting: Family Medicine

## 2021-01-20 DIAGNOSIS — F411 Generalized anxiety disorder: Secondary | ICD-10-CM

## 2021-01-20 DIAGNOSIS — F332 Major depressive disorder, recurrent severe without psychotic features: Secondary | ICD-10-CM

## 2021-01-20 MED ORDER — VILAZODONE HCL 40 MG PO TABS
60.0000 mg | ORAL_TABLET | Freq: Every day | ORAL | 1 refills | Status: DC
Start: 2021-01-20 — End: 2021-02-27

## 2021-01-20 NOTE — Telephone Encounter (Signed)
Pt needs a refill of the viibryd 40 mg. She will be out the weekend. Please send to [facility]

## 2021-01-23 DIAGNOSIS — F332 Major depressive disorder, recurrent severe without psychotic features: Secondary | ICD-10-CM | POA: Diagnosis not present

## 2021-01-24 DIAGNOSIS — F332 Major depressive disorder, recurrent severe without psychotic features: Secondary | ICD-10-CM | POA: Diagnosis not present

## 2021-01-25 ENCOUNTER — Encounter: Payer: Self-pay | Admitting: Family Medicine

## 2021-01-25 ENCOUNTER — Other Ambulatory Visit: Payer: Self-pay | Admitting: Family Medicine

## 2021-01-25 DIAGNOSIS — F332 Major depressive disorder, recurrent severe without psychotic features: Secondary | ICD-10-CM | POA: Diagnosis not present

## 2021-01-31 ENCOUNTER — Other Ambulatory Visit: Payer: Self-pay | Admitting: Psychiatry

## 2021-01-31 DIAGNOSIS — F332 Major depressive disorder, recurrent severe without psychotic features: Secondary | ICD-10-CM | POA: Diagnosis not present

## 2021-02-01 ENCOUNTER — Other Ambulatory Visit: Payer: Self-pay | Admitting: Family Medicine

## 2021-02-07 DIAGNOSIS — F332 Major depressive disorder, recurrent severe without psychotic features: Secondary | ICD-10-CM | POA: Diagnosis not present

## 2021-02-07 DIAGNOSIS — E119 Type 2 diabetes mellitus without complications: Secondary | ICD-10-CM | POA: Diagnosis not present

## 2021-02-09 DIAGNOSIS — F332 Major depressive disorder, recurrent severe without psychotic features: Secondary | ICD-10-CM | POA: Diagnosis not present

## 2021-02-14 DIAGNOSIS — F332 Major depressive disorder, recurrent severe without psychotic features: Secondary | ICD-10-CM | POA: Diagnosis not present

## 2021-02-16 DIAGNOSIS — F332 Major depressive disorder, recurrent severe without psychotic features: Secondary | ICD-10-CM | POA: Diagnosis not present

## 2021-02-17 DIAGNOSIS — L738 Other specified follicular disorders: Secondary | ICD-10-CM | POA: Diagnosis not present

## 2021-02-17 DIAGNOSIS — I781 Nevus, non-neoplastic: Secondary | ICD-10-CM | POA: Diagnosis not present

## 2021-02-17 DIAGNOSIS — L719 Rosacea, unspecified: Secondary | ICD-10-CM | POA: Diagnosis not present

## 2021-02-17 DIAGNOSIS — L700 Acne vulgaris: Secondary | ICD-10-CM | POA: Diagnosis not present

## 2021-02-18 ENCOUNTER — Other Ambulatory Visit: Payer: Self-pay | Admitting: Psychiatry

## 2021-02-18 DIAGNOSIS — F332 Major depressive disorder, recurrent severe without psychotic features: Secondary | ICD-10-CM

## 2021-02-18 DIAGNOSIS — F411 Generalized anxiety disorder: Secondary | ICD-10-CM

## 2021-02-21 ENCOUNTER — Other Ambulatory Visit: Payer: Self-pay | Admitting: Psychiatry

## 2021-02-21 DIAGNOSIS — F332 Major depressive disorder, recurrent severe without psychotic features: Secondary | ICD-10-CM | POA: Diagnosis not present

## 2021-02-23 DIAGNOSIS — F332 Major depressive disorder, recurrent severe without psychotic features: Secondary | ICD-10-CM | POA: Diagnosis not present

## 2021-02-24 ENCOUNTER — Other Ambulatory Visit: Payer: Self-pay | Admitting: Family Medicine

## 2021-02-26 ENCOUNTER — Other Ambulatory Visit: Payer: Self-pay | Admitting: Psychiatry

## 2021-02-26 DIAGNOSIS — F332 Major depressive disorder, recurrent severe without psychotic features: Secondary | ICD-10-CM

## 2021-02-26 DIAGNOSIS — F411 Generalized anxiety disorder: Secondary | ICD-10-CM

## 2021-02-27 ENCOUNTER — Other Ambulatory Visit: Payer: Self-pay | Admitting: Family Medicine

## 2021-02-27 DIAGNOSIS — F332 Major depressive disorder, recurrent severe without psychotic features: Secondary | ICD-10-CM | POA: Diagnosis not present

## 2021-03-01 DIAGNOSIS — F332 Major depressive disorder, recurrent severe without psychotic features: Secondary | ICD-10-CM | POA: Diagnosis not present

## 2021-03-04 ENCOUNTER — Other Ambulatory Visit: Payer: Self-pay | Admitting: Family Medicine

## 2021-03-08 ENCOUNTER — Other Ambulatory Visit: Payer: Self-pay

## 2021-03-08 ENCOUNTER — Telehealth: Payer: Self-pay

## 2021-03-08 DIAGNOSIS — F332 Major depressive disorder, recurrent severe without psychotic features: Secondary | ICD-10-CM | POA: Diagnosis not present

## 2021-03-08 MED ORDER — CELECOXIB 100 MG PO CAPS: 100.0000 mg | ORAL_CAPSULE | Freq: Two times a day (BID) | ORAL | 0 refills | Status: DC

## 2021-03-08 NOTE — Telephone Encounter (Signed)
Rx sent in

## 2021-03-08 NOTE — Telephone Encounter (Signed)
Is requesting call back with refill on celebrex.

## 2021-03-09 ENCOUNTER — Other Ambulatory Visit: Payer: Self-pay | Admitting: Family Medicine

## 2021-03-09 DIAGNOSIS — M797 Fibromyalgia: Secondary | ICD-10-CM

## 2021-03-10 DIAGNOSIS — F332 Major depressive disorder, recurrent severe without psychotic features: Secondary | ICD-10-CM | POA: Diagnosis not present

## 2021-03-14 ENCOUNTER — Encounter: Payer: Self-pay | Admitting: Psychiatry

## 2021-03-14 ENCOUNTER — Ambulatory Visit (INDEPENDENT_AMBULATORY_CARE_PROVIDER_SITE_OTHER): Payer: BC Managed Care – PPO | Admitting: Psychiatry

## 2021-03-14 ENCOUNTER — Other Ambulatory Visit: Payer: Self-pay

## 2021-03-14 DIAGNOSIS — F411 Generalized anxiety disorder: Secondary | ICD-10-CM | POA: Diagnosis not present

## 2021-03-14 DIAGNOSIS — G251 Drug-induced tremor: Secondary | ICD-10-CM

## 2021-03-14 DIAGNOSIS — F332 Major depressive disorder, recurrent severe without psychotic features: Secondary | ICD-10-CM

## 2021-03-14 DIAGNOSIS — G4710 Hypersomnia, unspecified: Secondary | ICD-10-CM | POA: Diagnosis not present

## 2021-03-14 DIAGNOSIS — R7989 Other specified abnormal findings of blood chemistry: Secondary | ICD-10-CM

## 2021-03-14 DIAGNOSIS — G4733 Obstructive sleep apnea (adult) (pediatric): Secondary | ICD-10-CM

## 2021-03-14 DIAGNOSIS — R52 Pain, unspecified: Secondary | ICD-10-CM

## 2021-03-14 DIAGNOSIS — Z79899 Other long term (current) drug therapy: Secondary | ICD-10-CM

## 2021-03-14 MED ORDER — OLANZAPINE 15 MG PO TABS: 15.0000 mg | ORAL_TABLET | Freq: Every day | ORAL | 1 refills | Status: DC

## 2021-03-14 NOTE — Patient Instructions (Signed)
Auvelity 

## 2021-03-14 NOTE — Progress Notes (Signed)
Deborah Soto [id number] 1966-01-04 56 y.o.     Subjective:   Patient ID:  Deborah Soto is a 56 y.o. (DOB Jul 10, 1965) female.  Chief Complaint:  Chief Complaint  Patient presents with   Follow-up   Depression   Anxiety   Depression        Associated symptoms include fatigue and myalgias.  Associated symptoms include no decreased concentration.   Deborah Soto presents to the office today for follow-up of TRD and anxiety, and insomnia.  seen December 08, 2018.  She was complaining of lithium tremor and some cognitive problems.  Hydroxyzine was stopped.  Propranolol increased to 60 mg twice daily for tremor.  Lithium level and amitriptyline levels were requested. Amitriptyline level to 37, nortriptyline 49 on amitriptyline 250 mg nightly. Lithium level 0.800 mg daily  Phone call on October 6 to discuss lab results as follows: Note   ----- Message from [name]., MD sent at 12/15/2018 10:08 AM EDT ----- Lithium level stable at 0.8 in a good range.  The previous was 0.7.  She is having some tremor issues.   Normal BMP including excellent creatinine and calcium levels.  Blood sugar is high but she is aware of that problem.   TSH is within normal limits.   Serum amitriptyline level is 286 which is at the upper end of the normal range and suggestive that we not try further increase.  We will discuss further options at her follow-up appointment.   No medication adjustments required, but because of her tremor and her lithium level is slightly higher than it was with the prior level if she wants to reduce the lithium from 3 of the 300 mg tablets daily to 2-1/2 of the 300 mg tablets daily she can do so.  At her last appointment she was also encouraged to try a higher dose of propranolol and that may have resolved the problem.  If it did do not reduce the lithium but if it did not and she wants to reduce the dose she can do so as noted.  She should call us if she has any  recurrence of symptoms.   Deborah Staggers MD, [name]     She had repeat lithium level at 750 mg daily of 0.  7 with some improvement in tremor.  In phone call on November 20 she reported mood was worse with weepiness, overreacting, "pity party".  Because mood symptoms were worse with the reduction lithium she was encouraged to increase the dosage back to 900 mg daily.   Last seen February 04, 2019.  The following changes were made: Increase propranolol to 80 mg twice daily for lithium tremor and anxiety her blood pressure and pulse appear high enough that she can tolerate this dosage.  Disc Se.  Disc risk with low BP and DM.  He has not been having any problems with low blood sugar. Amitriptyline 6 or 300 mg daily if tolerated.  Try to take some about 4 -5 hours before sleep Try to take lithium also 4-5 hours before sleep to minimize daytime tremor Add methylphenidate ER 27 mg in the morning.  If no response we will increase the dosage.  Seen May 08, 2019 with her husband. Aside from physically feeling like she can't do much then having memory issues.  Forgetful.  Loses track of thoughts between tasks.  Most embarrassing being around people and word-finding issues.   Problems with walking bc balance and weakness.  Fears falling. Only fall at  Xmas morning.  Tremor is awful.  Affects keying.   H notices cognitive problems and forgetfulness.    Shaking got really bad and got lithium level as low as 600 mg but the next day felt really helpless and everything seemed to be aimed at hurting me and felt disrespected.  Problems with boss and 56 yo taking 5 AP classes and 2 volleyball classes.  I've been on her to accomplish all these things.  She claimed that pt screaming at her.   H CO she was more irritable after the reduction in lithium.   H CO her anxiety also seems worse.  H says the last year has been unhealthy with 12 hour work days for her.   Doesn't go out much.    Attention problems more  noticeable.  Still dropping and tremor.  Tolerated the increase in propranolol.    CO shakiness from lithium which interferes with typing. Also a good bit of anxiety generally without panic.  BS are high.  NotTaking hydroxyzine.   sleeping better and still using CPAP.  Using CPAP regularly. Less awakening than before.  No SE. depression was worse after reducing the lithium.  Irritability was also worse after reducing the lithium to 600 mg daily.  seen May 08, 2019.  Multiple changes made:  Reduce propranolol to 60 mg twice daily for lithium tremor and anxiety since the increase was not helpful.   Restart B6 for lithium tremor. 500 mg BID. She forgot and doesn't want more. Try to take lithium also 4-5 hours before sleep to minimize daytime tremor. depression was worse after reducing the lithium.  Irritability was also worse after reducing the lithium to 600 mg daily. So reluctant to reduce it further.  It is unclear how to address the benefits of lithium without using lithium other than to consider Spravato  Increase methylphenidate ER 36 mg in the morning. Increase methylphenidate ER 36 mg in the morning.  Wait 1-2 weeks then reduce amitriptyline to 5 tablets nightly to see if cognition is better. Also saw [name] re: sleep disorder.  05/2019 appt with the following noted: Lost Rx of Concerta 36 so didn't take it long.  Out of it.  Still shakey and cognitive problems.  Dep 5/10/  Anxiety 6/10 worse at night.  No SI.  8 hours sleep.  Mild panic couple times daily. Frustrated with tremor and sleepiness which is worse than tiredness.   History of low vitamin D taking 150K/week but stopped 6 mos ago. Plan : Modafinil 100 mg tablet 1 dailly for 1 week, Then add Concerta 36 mg 1 each AM.  Option increase modafinil mid April  Restart B6 for lithium tremor. 500 mg BID. Get lab test at earliest convience  10/14/2019 appointment with the following noted: Has been on amitriptyline 250 mg for  mos.  Tried a week ago reducing to 200 and felt worse so back on 250. Weaned off Lyrica about a month ago.  Not much change off it.   Not sure if it helped energy or alertness. Saw Dr. Nonah Mattes in consultation. Mental clarity is a lot better with stimulants and not sleeping as much in daytime with current meds.  Severe drowsiness is a lot better.   Off metformin and added Glipizide.   Not great with depression 5/10.  Anxiety can trigger problems with thinking and somatic sx   I feel like it's something physical and can keep her up at night. Occ racing heart or feeling hot.  Caffeine variable.  Plan:Plan: Increase methylphenidate from 36 to 54 mg in the morning. Increase amitriptyline to 6 tablets in the evening    11/12/19 TC noting call: Pt called stated she's not feeling well at all. Ask for sooner apt than 10/7. Depression, anxiety & focus is much worse. Has not planned to hurt herself but thoughts of things would be better if I was not here have crossed her mind often. Contact ASAP @ [phone].  I put Pt on canc list  MD response: I reviewed the meds and previous med lists.  The next options are either Spravato or a major med change from amitriptyline which is not something I can do outside of an appt.  She's on cancellation list and we'll try to work her in sooner.  Nothing I can change right now except since the increase in amitriptyline has not helped, she can reduce it to 200 mg nightly in preparation for other changes to come. Nursing response after talking with pt: Discussed symptoms and medications with patient, she did not increase her Amitriptyline 50 mg up to 6 tablets so she will start that today. Said you may have told her that but she didn't remember. She has read about Spravato but she is not wanting to proceed with that. Informed her she's on cancellation list as well. She will call back with worsening signs or symptoms.  11/23/19 appt with the following noted: Forgot to increase  amitriptyline until noted. Tolerating it OK but hasn't had time to help. More alert with Concerta increase to 54 mg daily also with modafinil.  A little more focused and better productivity.  Still forgetful.   No SE noted except GI issues from diarrhea to constipation.   Still miserable and cry easily.  Try to numb herself.  Always tired especially emotionally.  Anxiety is high and predates current stimulants. No therapy since [facility].  She felt he pushed too hard for her to make changes.  I don't feel like I can deal with him right now. Plan: Continue meds & The increase in amitriptyline to 300 mg daily needs more time.  01/12/2020 appointment with the following noted: Not well.  Mood horrible.  Easily frustrated sometimes without reason and may nap or cry.  Anxiety is pretty bad.  Daytime sleepiness seems worse.  Can get nausea from anxiety.  Feels like function is slow.  Have to write everything down.  Hates home, job, high school volleyball program.  At some point I have to take responsibility.   Amitriptyline also not helping pain any.  Clenching jaw for 3 weeks. Likcking lips.  Picking fingers. D will play volleyball at [employer] [facility] [employer] [facility] [employer] and [place]. Plan: Reduce amitriptyline by 1 tablet every 4 days. Start Viibryd 10 mg daily with food for 7 days, then 20 mg daily for 7 days, then increase to 40 mg daily.  03/18/20 appt urgently made and noted:  Seen with husband. Still with jaw clenching day and night despite off stimulant. Now on Viibryd 60 mg since 03/02/20 and no stimulant., lithium 900 mg, and propranolol 80 BID. Rare hydroxyzine DT sleepiness, Gabapentin 300 TID. Not taking it with food.  LT sensitivity to noise and light and overstimulated and now bothering her more at work.  Always had to use shirts without tags and sensitivity to smells. Easily agitated in public per H.  Gets negative and wants to give up and not live anymore.  She says there's less chance of acting on it if she tells  H about it and she's doing so now.  Pain worse off amitriptyline esp around ribs and general diffuse pain.  Anxiety worse with stimulants and hasn't gone away.  Trouble staying asleep. Plan: Need to take Viibryd 60 with food emphasized.  She's not currently doing so. Restart low-dose amitriptyline for pain  04/22/2020 appointment with following noted: Only restarted amitriptyline 50 mg last week.  Stays asleep better with it.  Sleep 8-9 hours now but no change in pain yet.  Sleepier in day recently. Done a lot of travel lately. Maybe viibryd making a little difference with mood and anxiety now that has been on it longer.  Needs to take Viibryd in AM bc seems to interfere with sleep. Final season of travel volleyball with good team. H notes still some irritability. Pain worse off amitriptyline.  Can reduce to  amitriptyline 25 for pain if 50 mg remains too sedating.  Is sleeping better.      06/21/2020 appointment with following noted:  Seen with H Tolerating amitriptyline 50 mg HS with decent sleep. Really crappy and as bad as 10 years.  Situationally life sucks.  Work environment is bad with coworkers.  Since Sept work is bad.  Doesn't feel people are supportive at home or work. H working 12 hour days.  When she's off wants to sleep all day.   Still in therapy with Thayer Ohmhris.  Is good therapist. 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  Check lithium level [facility]  11/03/2020 appointment with the following noted: 56 yo D got Covid but is OK. Still doing Spravato 84 mg twice weekly and plans to continue for awhile. And for a total of 3 mos.  Tolerated well.  It's a nice feeling.   Didn't seem to help the depression for a couple of months.   It does feel different. SI has resolved lately which is particularly impressive given she  has a lot of free time. She has lot s of time with nothing to do bc quit her job. Job environment got worse and worse.  Had been there 9 years.   Doing PT for pain in her back.  Ongoing physical issues.   Mood clearly better with Spravato but not resolved.   Neck fusion recently. Plan: check lithium level Reduce olanzapine to 5 mg HS  12/30/2020 appointment with the following noted: Surges of anxious feelings in her chest without heart racing several times daily last 15 mins and without pcpt.  Stay a little on edge all the time.   Left job and too much time on her hands. 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  She's doing well statistically.  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 Spravato twice weekly.  Is better with it but still some mild cycling of depression.  Still upset over leaving work.  SI very rare and fleeting.  Had a really good plan but doesn't dwell on the plan. Lithium level 0.7 on 900 mg HS. Reduced olanzapine to 5 mg HS and has felt a little worse. Viibryd 60 mg daily. Amitrptyline 50 for pain.  Not sure if it helps.  PT helped a lot with tension in neck and down spine. Sleeping pretty well after 8 hours. Plan increase olanzapine back to 10 mg nightly for treatment resistant depression and anxiety.  Anxiety was worse after  reducing it and no symptoms were improved after reducing it. Try to stop amitriptyline because it is probably not needed in the presence of olanzapine. Check lithium level  03/14/2021 appointment noted: Increase olanzapine to 10 mg for anxiety spells that are unexpected and hit her in the chest esp in the morning. Very low right now.  Twice weekly Spravato and felt good for awhile.  Maybe a slump after Xmas.  Not much social interaction since she quit her job.  Xmas gave her focus.  Doesn't have this now.   Mark working 14-16 hours daily.  This won't get better. Kids are doing great but not around Sleep well generally and that is a benefit of  olanzapine. Tolerates it well.    Maggie [id number] and dating.  Past Psychiatric Medication Trials: Failed ECT and TMS,  Spravato amitriptyline helped for 2 to 3 years in 1987,   2021 No benefit from amitriptyline 300 mg with adequate duration.  Nortriptyline 200 with a therapeutic blood level. Trintellix, duloxetine 120, Lexapro 20, Fetzima,  Wellbutrin, fluoxetine, paroxetine, sertraline,  (Poor response to SSRIs) Viibryd 60 [place],  lithium SE,  Latuda 120, Abilify 15,  Vraylar, Rexulti,Seroquel 400, risperidone, Geodon,  Haldol for agitation, lamotrigine 300,  buspirone, clonazepam, gabapentin, propranolol modafinil, pramipexole,  Nuvigil, Vyvanse, Concerta SHE DOESN'T WANT STIMULANTS AGAIN bc of anxiety. Trazodone,   GENESIGHT COMPLETED  history of overdose on Xanax, There is a history of suicide attempts and psychiatric hospitalizations.  Review of Systems:  Review of Systems  Constitutional:  Positive for fatigue.  Cardiovascular:  Negative for chest pain and palpitations.  Gastrointestinal:  Negative for diarrhea.  Musculoskeletal:  Positive for back pain and myalgias. Negative for gait problem.       Less pain lately.  Neurological:  Positive for tremors and weakness. Negative for dizziness.  Psychiatric/Behavioral:  Negative for confusion, decreased concentration, hallucinations and self-injury. The patient is nervous/anxious.    Medications: I have reviewed the patient's current medications.  Current Outpatient Medications  Medication Sig Dispense Refill   ampicillin (PRINCIPEN) 500 MG capsule Take 500 mg by mouth daily.     celecoxib (CELEBREX) 100 MG capsule Take 1 capsule (100 mg total) by mouth 2 (two) times daily. 60 capsule 0   Cholecalciferol (VITAMIN D) 50 MCG (2000 UT) tablet Take 2,000 Units by mouth daily.     Continuous Blood Gluc Receiver (DEXCOM G6 RECEIVER) DEVI Use 4 times daily as needed to check blood sugar 1 each PRN   cyanocobalamin (,VITAMIN  B-12,) 1000 MCG/ML injection One Injection weekly for 4 weeks then one injection monthly for 3 month 7 mL 0   Empagliflozin-metFORMIN HCl ER (SYNJARDY XR) 25-1000 MG TB24 Take 1 tablet by mouth daily. 30 tablet 5   Esketamine HCl, 84 MG Dose, (SPRAVATO, 84 MG DOSE,) 28 MG/DEVICE SOPK Place 84 mg into the nose See admin instructions. Use twice weekly for 4 weeks then reduce to once weekly     gabapentin (NEURONTIN) 300 MG capsule Take 2 capsules in the morning, 1 in the middle of the day, and 2 at night. (Patient taking differently: Take 2 capsules in the morning and 2 at night.) 150 capsule 3   Insulin Pen Needle (PEN NEEDLES) 33G X 4 MM MISC Use daily as needed to inject insulin 100 each 1   lithium carbonate (ESKALITH) 450 MG CR tablet Take 2 tablets (900 mg total) by mouth at bedtime. 180 tablet 0   MOUNJARO 15 MG/0.5ML Pen Inject 15 mg into  the skin once a week. 6 mL 0   Multiple Vitamin (MULTIVITAMIN WITH MINERALS) TABS tablet Take 1 tablet by mouth daily.     pantoprazole (PROTONIX) 40 MG tablet TAKE ONE TABLET BY MOUTH ONCE DAILY FOR STOMACH PROTECTION 30 tablet 0   propranolol (INDERAL) 40 MG tablet take ONE TO TWO tablets every EIGHT hours FOR anxiety 150 tablet 0   TRESIBA FLEXTOUCH 100 UNIT/ML FlexTouch Pen Inject 80 Units into the skin at bedtime. 24 mL 2   tretinoin (RETIN-A) 0.1 % cream Apply 1 application topically at bedtime.     VIIBRYD 40 MG TABS Take 1.5 tablets (60 mg total) by mouth daily. 45 tablet 0   Vitamin D, Ergocalciferol, (DRISDOL) 1.25 MG (50000 UNIT) CAPS capsule Take 1 capsule (50,000 Units total) by mouth every 7 (seven) days. 15 capsule 3   XARELTO 20 MG TABS tablet Take 1 tablet (20 mg total) by mouth daily with supper. 30 tablet 5   amitriptyline (ELAVIL) 50 MG tablet Take 1 tablet (50 mg total) by mouth at bedtime. (Patient not taking: Reported on 03/14/2021) 90 tablet 0   OLANZapine (ZYPREXA) 15 MG tablet Take 1 tablet (15 mg total) by mouth at bedtime. 30 tablet 1    No current facility-administered medications for this visit.    Medication Side Effects: Sedation  Allergies:  Allergies  Allergen Reactions   Tramadol Other (See Comments)    Tingling all over    Hydrocodone     Ineffective     Past Medical History:  Diagnosis Date   Anemia    Anxiety    Asthma    Depression    Diabetes mellitus type 2 in obese (HCC) 06/11/2016   Dyspnea    Embolism - blood clot January 1997 & January 2017   Also had LLE DVT in 1997   History of kidney stones    Hypertension    Preeclampsia 10/10/2011   1999    Sleep apnea     Family History  Problem Relation Age of Onset   Dementia Father    Heart disease Father    Clotting disorder Mother    Arthritis Mother    Clotting disorder Sister    Clotting disorder Maternal Grandmother    Clotting disorder Maternal Aunt    Rheumatologic disease Neg Hx    Hyperparathyroidism Neg Hx     Social History   Socioeconomic History   Marital status: Married    Spouse name: Not on file   Number of children: Y   Years of education: Not on file   Highest education level: Not on file  Occupational History   Occupation: admin assist    Comment: insurance verification  Tobacco Use   Smoking status: Former    Packs/day: 1.00    Years: 20.00    Pack years: 20.00    Types: Cigarettes    Quit date: 03/12/2004    Years since quitting: 17.0   Smokeless tobacco: Never  Vaping Use   Vaping Use: Never used  Substance and Sexual Activity   Alcohol use: Yes    Alcohol/week: 1.0 standard drink    Types: 1 Cans of beer per week    Comment: 1-2 times a week   Drug use: No   Sexual activity: Yes    Partners: Male    Birth control/protection: None  Other Topics Concern   Not on file  Social History Narrative   Originally from [place]. Always lived in [place]. 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 No international travel recently. Previously has been to [place] in May 1996. No mold exposure recently. No bird  exposure. Does have multiple pets.    Social Determinants of Health   Financial Resource Strain: Not on file  Food Insecurity: Not on file  Transportation Needs: Not on file  Physical Activity: Not on file  Stress: Not on file  Social Connections: Not on file  Intimate Partner Violence: Not on file    Past Medical History, Surgical history, Social history, and Family history were reviewed and updated as appropriate.   Please see review of systems for further details on the patient's review from today.   Objective:   Physical Exam:  LMP  (LMP Unknown) Comment: last period ---  1 year ago (?)  Physical Exam Constitutional:      General: She is not in acute distress. Musculoskeletal:        General: No deformity.  Neurological:     Mental Status: She is alert and oriented to person, place, and time.     Cranial Nerves: No dysarthria.     Coordination: Coordination normal.  Psychiatric:        Attention and Perception: Attention and perception normal. She does not perceive auditory or visual hallucinations.        Mood and Affect: Mood is anxious and depressed. Affect is not labile, blunt, angry, tearful or inappropriate.        Speech: Speech normal.        Behavior: Behavior normal. Behavior is cooperative.        Thought Content: Thought content normal. Thought content is not paranoid or delusional. Thought content does not include homicidal or suicidal ideation. Thought content does not include suicidal plan.        Cognition and Memory: Cognition and memory normal.        Judgment: Judgment normal.     Comments: Insight intact Depression is worse post-holidays    Lab Review:     Component Value Date/Time   NA 140 11/22/2020 1223   NA 138 08/21/2019 0000   NA 140 12/11/2016 1236   K 4.1 11/22/2020 1223   K 4.2 12/11/2016 1236   CL 104 11/22/2020 1223   CO2 26 11/22/2020 1223   CO2 24 12/11/2016 1236   GLUCOSE 151 (H) 11/22/2020 1223   GLUCOSE 109 12/11/2016 1236    BUN 8 11/22/2020 1223   BUN 12 08/21/2019 0000   BUN 15.1 12/11/2016 1236   CREATININE 0.66 11/22/2020 1223   CREATININE 0.68 02/22/2020 1649   CREATININE 0.8 12/11/2016 1236   CALCIUM 9.6 11/22/2020 1223   CALCIUM 10.0 12/11/2016 1236   PROT 6.9 11/22/2020 1223   PROT 7.5 12/11/2016 1236   ALBUMIN 4.3 11/22/2020 1223   ALBUMIN 4.4 12/11/2016 1236   AST 40 (H) 11/22/2020 1223   AST 38 (H) 12/11/2016 1236   ALT 40 (H) 11/22/2020 1223   ALT 61 (H) 12/11/2016 1236   ALKPHOS 65 11/22/2020 1223   ALKPHOS 71 12/11/2016 1236   BILITOT 0.7 11/22/2020 1223   BILITOT 0.62 12/11/2016 1236   GFRNONAA >60 08/16/2020 1157   GFRNONAA 105 12/11/2018 1121   GFRAA 122 12/11/2018 1121       Component Value Date/Time   WBC 7.4 11/22/2020 1223   RBC 4.88 11/22/2020 1223   HGB 13.1 11/22/2020 1223   HGB 14.5 12/11/2016 1236   HCT 41.4 11/22/2020 1223   HCT 43.9 12/11/2016 1236   PLT  262.0 11/22/2020 1223   PLT 289 12/11/2016 1236   PLT 295 07/24/2016 1848   MCV 85.0 11/22/2020 1223   MCV 93.2 12/11/2016 1236   MCH 29.9 08/16/2020 1157   MCHC 31.6 11/22/2020 1223   RDW 16.4 (H) 11/22/2020 1223   RDW 13.5 12/11/2016 1236   LYMPHSABS 2.2 11/22/2020 1223   LYMPHSABS 2.4 12/11/2016 1236   MONOABS 0.6 11/22/2020 1223   MONOABS 0.7 12/11/2016 1236   EOSABS 0.1 11/22/2020 1223   EOSABS 0.7 (H) 12/11/2016 1236   BASOSABS 0.0 11/22/2020 1223   BASOSABS 0.0 12/11/2016 1236    Lithium Lvl  Date Value Ref Range Status  11/22/2020 0.7 0.6 - 1.2 mmol/L Final   11/22/20  lithium 0.7 on 900   Serum nortriptyline level on 150 mg a day was 109  Amitriptyline  Level 12/11/2018 =237  On 250 mg daily,.  12/23/2020 vitamin D level 31.38, vitamin B12 519  No results found for: PHENYTOIN, PHENOBARB, VALPROATE, CBMZ   .res Assessment: Plan:    Severe recurrent major depression without psychotic features (HCC) - Plan: OLANZapine (ZYPREXA) 15 MG tablet  Generalized anxiety disorder - Plan:  OLANZapine (ZYPREXA) 15 MG tablet  Hypersomnolence  Obstructive sleep apnea  Diffuse pain  Lithium-induced tremor  Low vitamin D level  Lithium use   Chrishelle has chronic severe major depression and generalized anxiety that is treatment resistant and failed multiple medications ECT and TMS as noted above including SSRIs, try cyclic's, lithium, and atypical antipsychotics for depression.. She has had partial benefit from amitriptyline plus lithium.  However she is having tremor problems from the lithium.  Efforts to reduce the lithium have led to worsening psychiatric symptoms.  There has been a discussion about possible bipolar elements but she has no clear manic episodes unrelated to medication changes.  She has features of atypical depression.  So an MAO inhibitor is attractive from that perspective.  Discussed Spravato option in detail.  We will discuss it again at follow-up appointment.  She was given information to read in the interim and the treatment plan was discussed in terms of frequency and side effects.  She is more open to this now.  Extensive discussion about procedure time commitment. Continue Spravato twice  weekly Dr. Evelene Croon.  Trial increase olanzapine to 15 mg HS For anxiety and depression.  Nothing better after reducing it.  Therefore consideration could be given to other trials of MAO inhibitors as they are classically more effective than other antidepressants and atypical depression.  She did fail a trial of Emsam.  Consideration could be given to using tablet version of selegiline and going to much higher doses or the use of Parnate or Nardil.. Other option for TRD not tried is Symbyax but weight gain risk.  Option of adding olanzapine to current antidepressant but she reports she does not check her blood sugar very often and that would not be a good scenario with olanzapine.  Statistically speaking the most potentially beneficial option would be Parnate or Nardil but  they are difficult to use  Consider Auvelity.  Need to take Viibryd 60 with food emphasized.  She's improving but not consistent.  check lithium level 0.7 on 900 mg.  continue  Rec counseling.  Last did April 2019 after a hospitalization. She's back seeing Kennon Rounds.. Needs more purpose in her day. She needs a fulfilling job.   Encourage physical activity and weight loss.  She is not particularly motivated and feels that is hard to accomplish  because of chronic pain.  Chronic pain complicates her treatment of depression.  Recent B12 deficiency dx and treatment just started. Increase vitamin D bc level 31 on 2000U daily  Hold B6 for lithium tremor. 500 mg BID. If needed.  Tremor is better.  None currently.  Try to take lithium also 4-5 hours before sleep to minimize daytime tremor. depression was worse after reducing the lithium.  Irritability was also worse after reducing the lithium to 600 mg daily.   DC amitriptyline successful now that she's on olanzapine..  Disc weith loss meds Monjarou recently started for DM and goals.  Follow-up 8 weeks  Deborah Staggers MD, [name]  Future Appointments  Date Time Provider Department Center  03/28/2021 10:00 AM Ardith Dark, MD [facility]    No orders of the defined types were placed in this encounter.      -------------------------------

## 2021-03-16 DIAGNOSIS — F332 Major depressive disorder, recurrent severe without psychotic features: Secondary | ICD-10-CM | POA: Diagnosis not present

## 2021-03-17 ENCOUNTER — Telehealth: Payer: Self-pay | Admitting: *Deleted

## 2021-03-17 NOTE — Telephone Encounter (Signed)
Key: [id number] - Rx #: [id number] Status Sent to [facility] Dexcom G6 Transmitter Waiting for determination

## 2021-03-17 NOTE — Telephone Encounter (Signed)
Approvedtoday Effective from 03/17/2021 through 03/16/2022. Pharmacy notified

## 2021-03-21 ENCOUNTER — Other Ambulatory Visit: Payer: Self-pay | Admitting: Family Medicine

## 2021-03-21 DIAGNOSIS — F332 Major depressive disorder, recurrent severe without psychotic features: Secondary | ICD-10-CM | POA: Diagnosis not present

## 2021-03-21 DIAGNOSIS — M797 Fibromyalgia: Secondary | ICD-10-CM

## 2021-03-23 ENCOUNTER — Other Ambulatory Visit: Payer: Self-pay | Admitting: Psychiatry

## 2021-03-23 ENCOUNTER — Other Ambulatory Visit: Payer: Self-pay | Admitting: Family Medicine

## 2021-03-23 DIAGNOSIS — F332 Major depressive disorder, recurrent severe without psychotic features: Secondary | ICD-10-CM | POA: Diagnosis not present

## 2021-03-27 ENCOUNTER — Ambulatory Visit: Payer: BC Managed Care – PPO | Admitting: Family Medicine

## 2021-03-27 DIAGNOSIS — F332 Major depressive disorder, recurrent severe without psychotic features: Secondary | ICD-10-CM | POA: Diagnosis not present

## 2021-03-28 ENCOUNTER — Other Ambulatory Visit: Payer: Self-pay | Admitting: Family Medicine

## 2021-03-28 ENCOUNTER — Encounter: Payer: Self-pay | Admitting: Family Medicine

## 2021-03-28 ENCOUNTER — Ambulatory Visit: Payer: BC Managed Care – PPO | Admitting: Family Medicine

## 2021-03-28 ENCOUNTER — Other Ambulatory Visit: Payer: Self-pay

## 2021-03-28 VITALS — BP 112/70 | HR 88 | Temp 98.6°F | Ht 68.0 in | Wt 240.2 lb

## 2021-03-28 DIAGNOSIS — E1169 Type 2 diabetes mellitus with other specified complication: Secondary | ICD-10-CM | POA: Diagnosis not present

## 2021-03-28 DIAGNOSIS — R52 Pain, unspecified: Secondary | ICD-10-CM

## 2021-03-28 DIAGNOSIS — F332 Major depressive disorder, recurrent severe without psychotic features: Secondary | ICD-10-CM

## 2021-03-28 DIAGNOSIS — E669 Obesity, unspecified: Secondary | ICD-10-CM | POA: Diagnosis not present

## 2021-03-28 LAB — POCT GLYCOSYLATED HEMOGLOBIN (HGB A1C): Hemoglobin A1C: 6.1 % — AB (ref 4.0–5.6)

## 2021-03-28 LAB — GLUCOSE, POCT (MANUAL RESULT ENTRY): POC Glucose: 6.1 mg/dl — AB (ref 70–99)

## 2021-03-28 MED ORDER — SYNJARDY XR 25-1000 MG PO TB24: 1.0000 | ORAL_TABLET | Freq: Every day | ORAL | 5 refills | Status: DC

## 2021-03-28 MED ORDER — TRESIBA FLEXTOUCH 100 UNIT/ML ~~LOC~~ SOPN: 50.0000 [IU] | PEN_INJECTOR | Freq: Every day | SUBCUTANEOUS | 2 refills | Status: DC

## 2021-03-28 MED ORDER — AZELASTINE HCL 0.1 % NA SOLN: 2.0000 | Freq: Two times a day (BID) | NASAL | 12 refills | Status: DC

## 2021-03-28 NOTE — Assessment & Plan Note (Signed)
Improving.  She is working with physical therapy.  She takes Celebrex as needed.

## 2021-03-28 NOTE — Patient Instructions (Signed)
It was very nice to see you today!  Your A1c looks great today.  It is 6.1.  Your continuous glucose monitor may be reading high.  Please try to calibrate your device.  We will continue your current treatment plan for now.  Please continue to wean down the tresiba.  I will see you back in 3 months.  Come back sooner if needed.  Take care, Dr [name]  PLEASE NOTE:  If you had any lab tests please let us know if you have not heard back within a few days. You may see your results on mychart before we have a chance to review them but we will give you a call once they are reviewed by [name]. If we ordered any referrals today, please let us know if you have not heard from their office within the next week.   Please try these tips to maintain a healthy lifestyle:  Eat at least 3 REAL meals and 1-2 snacks per day.  Aim for no more than 5 hours between eating.  If you eat breakfast, please do so within one hour of getting up.   Each meal should contain half fruits/vegetables, one quarter protein, and one quarter carbs (no bigger than a computer mouse)  Cut down on sweet beverages. This includes juice, soda, and sweet tea.   Drink at least 1 glass of water with each meal and aim for at least 8 glasses per day  Exercise at least 150 minutes every week.

## 2021-03-28 NOTE — Telephone Encounter (Signed)
The original prescription was discontinued on 12/30/2020 by Josie Dixon, CMA for the following reason: Error. Renewing this prescription may not be appropriate.

## 2021-03-28 NOTE — Assessment & Plan Note (Signed)
Follows with psychiatry.  She is on ketamine nasal spray.  Symptoms seem to be improving.  We will continue treatment plan per psychiatry.

## 2021-03-28 NOTE — Assessment & Plan Note (Signed)
A1c improved to 6.1.  Her home glucose monitor is reading about 50 points higher than our point-of-care glucose machine.  Advised patient to try calibrating her machine.  She is down to about 47 units on her tresiba.  She will continue to wean down as tolerated.  We will continue monitor of 15 mg weekly, and Synjardy 12.07-998 twice daily.  We will recheck A1c again in 3 months.

## 2021-03-28 NOTE — Progress Notes (Signed)
° °  Deborah Soto is a 56 y.o. female who presents today for an office visit.  Assessment/Plan:  New/Acute Problems: Rhinitis No red flags.  Will start Astelin nasal spray. Can use OTC meds if needed.   Chronic Problems Addressed Today: Diabetes mellitus type 2 in obese (HCC) A1c improved to 6.1.  Her home glucose monitor is reading about 50 points higher than our point-of-care glucose machine.  Advised patient to try calibrating her machine.  She is down to about 47 units on her tresiba.  She will continue to wean down as tolerated.  We will continue monitor of 15 mg weekly, and Synjardy 12.07-998 twice daily.  We will recheck A1c again in 3 months.  MDD (major depressive disorder), recurrent severe, without psychosis (HCC) Follows with psychiatry.  She is on ketamine nasal spray.  Symptoms seem to be improving.  We will continue treatment plan per psychiatry.  Diffuse pain Improving.  She is working with physical therapy.  She takes Celebrex as needed.     Subjective:  HPI:  She is here to follow up on diabetes. Last saw her in the office on 01/18/2021.   She has been doing well since last visit. She is on Mounjaro 15 mg weekly and Synjardy 12.07-998 mg twice daily. She is compliant with her medications. No reported side effects. Her blood sugar are usually in 160's at home. She reports her blood sugar was 208 this morning. She thinks her device has been reading highShe also has been eating dinner late at night and thinks this could be contributing. She is down to 47 units on her [place]. She is down about 5 pounds since last visit. No fatigue, headaches or dry mouth.  She has had some issue with rhinorrhea. This been going on for few weeks. She has not tried any treatment. No fever, chill or nausea. No reported sinus pressure.         Objective:  Physical Exam: BP 112/70    Pulse 88    Temp 98.6 F (37 C) (Temporal)    Ht 5\' 8"  (1.727 m)    Wt 240 lb 3.2 oz (109 kg)    LMP   (LMP Unknown) Comment: last period ---  1 year ago (?)   SpO2 98%    BMI 36.52 kg/m   Gen: No acute distress, resting comfortably CV: Regular rate and rhythm with no murmurs appreciated Pulm: Normal work of breathing, clear to auscultation bilaterally with no crackles, wheezes, or rhonchi Neuro: Grossly normal, moves all extremities Psych: Normal affect and thought content       I,Savera Zaman,acting as a scribe for , MD.,have documented all relevant documentation on the behalf of Deborah Doe, MD,as directed by  Deborah Doe, MD while in the presence of Deborah Doe, MD.  Deborah Soto. Katina Degree, MD  I, [name], MD, have reviewed all documentation for this visit. The documentation on 03/28/21 for the exam, diagnosis, procedures, and orders are all accurate and complete.  03/28/2021 10:40 AM

## 2021-03-29 DIAGNOSIS — F332 Major depressive disorder, recurrent severe without psychotic features: Secondary | ICD-10-CM | POA: Diagnosis not present

## 2021-03-30 DIAGNOSIS — F332 Major depressive disorder, recurrent severe without psychotic features: Secondary | ICD-10-CM | POA: Diagnosis not present

## 2021-04-02 ENCOUNTER — Other Ambulatory Visit: Payer: Self-pay | Admitting: Psychiatry

## 2021-04-02 DIAGNOSIS — F332 Major depressive disorder, recurrent severe without psychotic features: Secondary | ICD-10-CM

## 2021-04-03 ENCOUNTER — Other Ambulatory Visit: Payer: Self-pay | Admitting: Psychiatry

## 2021-04-03 ENCOUNTER — Other Ambulatory Visit: Payer: Self-pay | Admitting: Family Medicine

## 2021-04-03 DIAGNOSIS — F411 Generalized anxiety disorder: Secondary | ICD-10-CM

## 2021-04-03 DIAGNOSIS — F332 Major depressive disorder, recurrent severe without psychotic features: Secondary | ICD-10-CM | POA: Diagnosis not present

## 2021-04-04 DIAGNOSIS — G4733 Obstructive sleep apnea (adult) (pediatric): Secondary | ICD-10-CM | POA: Diagnosis not present

## 2021-04-04 DIAGNOSIS — I10 Essential (primary) hypertension: Secondary | ICD-10-CM | POA: Diagnosis not present

## 2021-04-04 DIAGNOSIS — F332 Major depressive disorder, recurrent severe without psychotic features: Secondary | ICD-10-CM | POA: Diagnosis not present

## 2021-04-05 ENCOUNTER — Other Ambulatory Visit: Payer: Self-pay | Admitting: Psychiatry

## 2021-04-05 DIAGNOSIS — F332 Major depressive disorder, recurrent severe without psychotic features: Secondary | ICD-10-CM

## 2021-04-05 DIAGNOSIS — F411 Generalized anxiety disorder: Secondary | ICD-10-CM

## 2021-04-06 DIAGNOSIS — F332 Major depressive disorder, recurrent severe without psychotic features: Secondary | ICD-10-CM | POA: Diagnosis not present

## 2021-04-10 DIAGNOSIS — F332 Major depressive disorder, recurrent severe without psychotic features: Secondary | ICD-10-CM | POA: Diagnosis not present

## 2021-04-11 DIAGNOSIS — F332 Major depressive disorder, recurrent severe without psychotic features: Secondary | ICD-10-CM | POA: Diagnosis not present

## 2021-04-13 ENCOUNTER — Telehealth: Payer: Self-pay | Admitting: Psychiatry

## 2021-04-13 NOTE — Telephone Encounter (Signed)
Sure this makes sense.  I'm still seeing her

## 2021-04-13 NOTE — Telephone Encounter (Signed)
Let her know the staff will start working on this right away and we'll be in touch

## 2021-04-13 NOTE — Telephone Encounter (Signed)
Please see message. °

## 2021-04-13 NOTE — Telephone Encounter (Signed)
Patient notified

## 2021-04-13 NOTE — Telephone Encounter (Signed)
Pt called and said that she is not happy with dr. Evelene Croon office where she has spravato. She said the rooms are noisy and today she went there and he wasn't there so she had to leave. She would like to transfer to our office for spravato. If there is a waiting list she would to be put on it. Her next appt is 2/22

## 2021-04-16 ENCOUNTER — Other Ambulatory Visit: Payer: Self-pay | Admitting: Family Medicine

## 2021-04-17 DIAGNOSIS — F332 Major depressive disorder, recurrent severe without psychotic features: Secondary | ICD-10-CM | POA: Diagnosis not present

## 2021-04-18 DIAGNOSIS — F332 Major depressive disorder, recurrent severe without psychotic features: Secondary | ICD-10-CM | POA: Diagnosis not present

## 2021-04-19 DIAGNOSIS — F332 Major depressive disorder, recurrent severe without psychotic features: Secondary | ICD-10-CM | POA: Diagnosis not present

## 2021-04-20 DIAGNOSIS — F332 Major depressive disorder, recurrent severe without psychotic features: Secondary | ICD-10-CM | POA: Diagnosis not present

## 2021-04-24 DIAGNOSIS — F332 Major depressive disorder, recurrent severe without psychotic features: Secondary | ICD-10-CM | POA: Diagnosis not present

## 2021-04-25 DIAGNOSIS — F332 Major depressive disorder, recurrent severe without psychotic features: Secondary | ICD-10-CM | POA: Diagnosis not present

## 2021-04-27 ENCOUNTER — Other Ambulatory Visit: Payer: Self-pay | Admitting: Psychiatry

## 2021-04-27 DIAGNOSIS — F332 Major depressive disorder, recurrent severe without psychotic features: Secondary | ICD-10-CM | POA: Diagnosis not present

## 2021-04-27 DIAGNOSIS — F411 Generalized anxiety disorder: Secondary | ICD-10-CM

## 2021-05-01 DIAGNOSIS — F332 Major depressive disorder, recurrent severe without psychotic features: Secondary | ICD-10-CM | POA: Diagnosis not present

## 2021-05-02 DIAGNOSIS — F332 Major depressive disorder, recurrent severe without psychotic features: Secondary | ICD-10-CM | POA: Diagnosis not present

## 2021-05-03 ENCOUNTER — Other Ambulatory Visit: Payer: Self-pay

## 2021-05-03 ENCOUNTER — Encounter: Payer: Self-pay | Admitting: Psychiatry

## 2021-05-03 ENCOUNTER — Ambulatory Visit: Payer: BC Managed Care – PPO | Admitting: Psychiatry

## 2021-05-03 DIAGNOSIS — R52 Pain, unspecified: Secondary | ICD-10-CM | POA: Diagnosis not present

## 2021-05-03 DIAGNOSIS — F339 Major depressive disorder, recurrent, unspecified: Secondary | ICD-10-CM

## 2021-05-03 DIAGNOSIS — G4710 Hypersomnia, unspecified: Secondary | ICD-10-CM | POA: Diagnosis not present

## 2021-05-03 DIAGNOSIS — G4733 Obstructive sleep apnea (adult) (pediatric): Secondary | ICD-10-CM

## 2021-05-03 DIAGNOSIS — Z79899 Other long term (current) drug therapy: Secondary | ICD-10-CM

## 2021-05-03 DIAGNOSIS — F411 Generalized anxiety disorder: Secondary | ICD-10-CM

## 2021-05-03 DIAGNOSIS — R7989 Other specified abnormal findings of blood chemistry: Secondary | ICD-10-CM

## 2021-05-03 DIAGNOSIS — G251 Drug-induced tremor: Secondary | ICD-10-CM

## 2021-05-03 MED ORDER — CLONIDINE HCL 0.1 MG PO TABS: ORAL_TABLET | ORAL | 1 refills | Status: DC

## 2021-05-03 NOTE — Progress Notes (Signed)
[name] [id number] 22-Oct-1965 56 y.o.     Subjective:   Patient ID:  Deborah Soto is a 56 y.o. (DOB 1965-12-24) female.  Chief Complaint:  Chief Complaint  Patient presents with   Follow-up   Depression   Anxiety   Fatigue   ADD   Depression        Associated symptoms include fatigue and myalgias.  Associated symptoms include no decreased concentration.   Deborah Soto presents to the office today for follow-up of TRD and anxiety, and insomnia.  seen December 08, 2018.  She was complaining of lithium tremor and some cognitive problems.  Hydroxyzine was stopped.  Propranolol increased to 60 mg twice daily for tremor.  Lithium level and amitriptyline levels were requested. Amitriptyline level to 37, nortriptyline 49 on amitriptyline 250 mg nightly. Lithium level 0.800 mg daily  Phone call on October 6 to discuss lab results as follows: Note   ----- Message from [name]., MD sent at 12/15/2018 10:08 AM EDT ----- Lithium level stable at 0.8 in a good range.  The previous was 0.7.  She is having some tremor issues.   Normal BMP including excellent creatinine and calcium levels.  Blood sugar is high but she is aware of that problem.   TSH is within normal limits.   Serum amitriptyline level is 286 which is at the upper end of the normal range and suggestive that we not try further increase.  We will discuss further options at her follow-up appointment.   No medication adjustments required, but because of her tremor and her lithium level is slightly higher than it was with the prior level if she wants to reduce the lithium from 3 of the 300 mg tablets daily to 2-1/2 of the 300 mg tablets daily she can do so.  At her last appointment she was also encouraged to try a higher dose of propranolol and that may have resolved the problem.  If it did do not reduce the lithium but if it did not and she wants to reduce the dose she can do so as noted.  She should call us  if she has any recurrence of symptoms.   [name], [name]     She had repeat lithium level at 750 mg daily of 0.  7 with some improvement in tremor.  In phone call on November 20 she reported mood was worse with weepiness, overreacting, "pity party".  Because mood symptoms were worse with the reduction lithium she was encouraged to increase the dosage back to 900 mg daily.   Last seen February 04, 2019.  The following changes were made: Increase propranolol to 80 mg twice daily for lithium tremor and anxiety her blood pressure and pulse appear high enough that she can tolerate this dosage.  Disc Se.  Disc risk with low BP and DM.  He has not been having any problems with low blood sugar. Amitriptyline 6 or 300 mg daily if tolerated.  Try to take some about 4 -5 hours before sleep Try to take lithium also 4-5 hours before sleep to minimize daytime tremor Add methylphenidate ER 27 mg in the morning.  If no response we will increase the dosage.  Seen May 08, 2019 with her husband. Aside from physically feeling like she can't do much then having memory issues.  Forgetful.  Loses track of thoughts between tasks.  Most embarrassing being around people and word-finding issues.   Problems with walking bc balance and weakness.  Fears falling. Only fall at Xmas morning.  Tremor is awful.  Affects keying.   H notices cognitive problems and forgetfulness.    Shaking got really bad and got lithium level as low as 600 mg but the next day felt really helpless and everything seemed to be aimed at hurting me and felt disrespected.  Problems with boss and 56 yo taking 5 AP classes and 2 volleyball classes.  I've been on her to accomplish all these things.  She claimed that pt screaming at her.   H CO she was more irritable after the reduction in lithium.   H CO her anxiety also seems worse.  H says the last year has been unhealthy with 12 hour work days for her.   Doesn't go out much.    Attention  problems more noticeable.  Still dropping and tremor.  Tolerated the increase in propranolol.    CO shakiness from lithium which interferes with typing. Also a good bit of anxiety generally without panic.  BS are high.  NotTaking hydroxyzine.   sleeping better and still using CPAP.  Using CPAP regularly. Less awakening than before.  No SE. depression was worse after reducing the lithium.  Irritability was also worse after reducing the lithium to 600 mg daily.  seen May 08, 2019.  Multiple changes made:  Reduce propranolol to 60 mg twice daily for lithium tremor and anxiety since the increase was not helpful.   Restart B6 for lithium tremor. 500 mg BID. She forgot and doesn't want more. Try to take lithium also 4-5 hours before sleep to minimize daytime tremor. depression was worse after reducing the lithium.  Irritability was also worse after reducing the lithium to 600 mg daily. So reluctant to reduce it further.  It is unclear how to address the benefits of lithium without using lithium other than to consider Spravato  Increase methylphenidate ER 36 mg in the morning. Increase methylphenidate ER 36 mg in the morning.  Wait 1-2 weeks then reduce amitriptyline to 5 tablets nightly to see if cognition is better. Also saw [name] re: sleep disorder.  05/2019 appt with the following noted: Lost Rx of Concerta 36 so didn't take it long.  Out of it.  Still shakey and cognitive problems.  Dep 5/10/  Anxiety 6/10 worse at night.  No SI.  8 hours sleep.  Mild panic couple times daily. Frustrated with tremor and sleepiness which is worse than tiredness.   History of low vitamin D taking 150K/week but stopped 6 mos ago. Plan : Modafinil 100 mg tablet 1 dailly for 1 week, Then add Concerta 36 mg 1 each AM.  Option increase modafinil mid April  Restart B6 for lithium tremor. 500 mg BID. Get lab test at earliest convience  10/14/2019 appointment with the following noted: Has been on amitriptyline  250 mg for mos.  Tried a week ago reducing to 200 and felt worse so back on 250. Weaned off Lyrica about a month ago.  Not much change off it.   Not sure if it helped energy or alertness. Saw Dr. Nonah Mattes in consultation. Mental clarity is a lot better with stimulants and not sleeping as much in daytime with current meds.  Severe drowsiness is a lot better.   Off metformin and added Glipizide.   Not great with depression 5/10.  Anxiety can trigger problems with thinking and somatic sx   I feel like it's something physical and can keep her up at night. Occ racing  heart or feeling hot.  Caffeine variable.  Plan:Plan: Increase methylphenidate from 36 to 54 mg in the morning. Increase amitriptyline to 6 tablets in the evening    11/12/19 TC noting call: Pt called stated she's not feeling well at all. Ask for sooner apt than 10/7. Depression, anxiety & focus is much worse. Has not planned to hurt herself but thoughts of things would be better if I was not here have crossed her mind often. Contact ASAP @ [phone].  I put Pt on canc list  MD response: I reviewed the meds and previous med lists.  The next options are either Spravato or a major med change from amitriptyline which is not something I can do outside of an appt.  She's on cancellation list and we'll try to work her in sooner.  Nothing I can change right now except since the increase in amitriptyline has not helped, she can reduce it to 200 mg nightly in preparation for other changes to come. Nursing response after talking with pt: Discussed symptoms and medications with patient, she did not increase her Amitriptyline 50 mg up to 6 tablets so she will start that today. Said you may have told her that but she didn't remember. She has read about Spravato but she is not wanting to proceed with that. Informed her she's on cancellation list as well. She will call back with worsening signs or symptoms.  11/23/19 appt with the following noted: Forgot to  increase amitriptyline until noted. Tolerating it OK but hasn't had time to help. More alert with Concerta increase to 54 mg daily also with modafinil.  A little more focused and better productivity.  Still forgetful.   No SE noted except GI issues from diarrhea to constipation.   Still miserable and cry easily.  Try to numb herself.  Always tired especially emotionally.  Anxiety is high and predates current stimulants. [facility].  She felt he pushed too hard for her to make changes.  I don't feel like I can deal with him right now. Plan: Continue meds & The increase in amitriptyline to 300 mg daily needs more time.  01/12/2020 appointment with the following noted: Not well.  Mood horrible.  Easily frustrated sometimes without reason and may nap or cry.  Anxiety is pretty bad.  Daytime sleepiness seems worse.  Can get nausea from anxiety.  Feels like function is slow.  Have to write everything down.  Hates home, job, high school volleyball program.  At some point I have to take responsibility.   Amitriptyline also not helping pain any.  Clenching jaw for 3 weeks. Likcking lips.  Picking fingers. [facility] [place] [facility] [employer] [facility] [place]. Plan: Reduce amitriptyline by 1 tablet every 4 days. Start Viibryd 10 mg daily with food for 7 days, then 20 mg daily for 7 days, then increase to 40 mg daily.  03/18/20 appt urgently made and noted:  Seen with husband. Still with jaw clenching day and night despite off stimulant. Now on Viibryd 60 mg since 03/02/20 and no stimulant., lithium 900 mg, and propranolol 80 BID. Rare hydroxyzine DT sleepiness, Gabapentin 300 TID. Not taking it with food.  LT sensitivity to noise and light and overstimulated and now bothering her more at work.  Always had to use shirts without tags and sensitivity to smells. Easily agitated in public per H.  Gets negative and wants to give up and not live anymore.  She says there's less chance of acting  on it if  she tells H about it and she's doing so now.  Pain worse off amitriptyline esp around ribs and general diffuse pain.  Anxiety worse with stimulants and hasn't gone away.  Trouble staying asleep. Plan: Need to take Viibryd 60 with food emphasized.  She's not currently doing so. Restart low-dose amitriptyline for pain  04/22/2020 appointment with following noted: Only restarted amitriptyline 50 mg last week.  Stays asleep better with it.  Sleep 8-9 hours now but no change in pain yet.  Sleepier in day recently. Done a lot of travel lately. Maybe viibryd making a little difference with mood and anxiety now that has been on it longer.  Needs to take Viibryd in AM bc seems to interfere with sleep. Final season of travel volleyball with good team. H notes still some irritability. Pain worse off amitriptyline.  Can reduce to  amitriptyline 25 for pain if 50 mg remains too sedating.  Is sleeping better.      06/21/2020 appointment with following noted:  Seen with H Tolerating amitriptyline 50 mg HS with decent sleep. Really crappy and as bad as 10 years.  Situationally life sucks.  Work environment is bad with coworkers.  Since Sept work is bad.  Doesn't feel people are supportive at home or work. H working 12 hour days.  When she's off wants to sleep all day.   Still in therapy with Thayer Ohm.  Is good therapist. 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  Check lithium level Referred to Dr. Evelene Croon for [facility]  11/03/2020 appointment with the following noted: 56 yo D got Covid but is OK. Still doing Spravato 84 mg twice weekly and plans to continue for awhile. And for a total of 3 mos.  Tolerated well.  It's a nice feeling.   Didn't seem to help the depression for a couple of months.   It does feel different. SI has resolved lately which is particularly impressive  given she has a lot of free time. She has lot s of time with nothing to do bc quit her job. Job environment got worse and worse.  Had been there 9 years.   Doing PT for pain in her back.  Ongoing physical issues.   Mood clearly better with Spravato but not resolved.   Neck fusion recently. Plan: check lithium level Reduce olanzapine to 5 mg HS  12/30/2020 appointment with the following noted: Surges of anxious feelings in her chest without heart racing several times daily last 15 mins and without pcpt.  Stay a little on edge all the time.   Left job and too much time on her hands. Youngest at [employer] and [employer] in new position.  She's doing well statistically.  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 Spravato twice weekly.  Is better with it but still some mild cycling of depression.  Still upset over leaving work.  SI very rare and fleeting.  Had a really good plan but doesn't dwell on the plan. Lithium level 0.7 on 900 mg HS. Reduced olanzapine to 5 mg HS and has felt a little worse. Viibryd 60 mg daily. Amitrptyline 50 for pain.  Not sure if it helps.  PT helped a lot with tension in neck and down spine. Sleeping pretty well after 8 hours. Plan increase olanzapine back to 10 mg nightly for treatment resistant depression and  anxiety.  Anxiety was worse after reducing it and no symptoms were improved after reducing it. Try to stop amitriptyline because it is probably not needed in the presence of olanzapine. Check lithium level  03/14/2021 appointment noted: Increase olanzapine to 10 mg for anxiety spells that are unexpected and hit her in the chest esp in the morning. Very low right now.  Twice weekly Spravato and felt good for awhile.  Maybe a slump after Xmas.  Not much social interaction since she quit her job.  Xmas gave her focus.  Doesn't have this now.   Mark working 14-16 hours daily.  This won't get better. Kids are doing great but not around Sleep well generally and that is a benefit  of olanzapine. Tolerates it well.   Plan: Trial increase olanzapine to 15 mg HS  05/03/2021 appointment with the following noted: Still getting Spravato twice weekly.   I love it.  Totally relaxed Depression and anxiety SI is gone or fleeting with Spravato. Still in therapy with Anson Fret and thinks a lot of stuff go back to childhood.  Was always criticized for normal childhood mistakes.  So coped by overcompensating with going big and grand. Can't get energy up to do anything. Never felt she had a manic periods except overstimulated CC anxious feeling in body tingling and tight uncomfortable feeling.  Reminds her of stimulant effects but less intense and worse in AM. Everything still seems to hard like going to grocery store and worry over messing up cooking. Watches too much TV For a little while increase olanzapine seemed to help.  Tolerating it. Depression is not that bad compared to the past.  ECT-MADRS    Flowsheet Row Office Visit from 05/03/2021 in [facility]  MADRS Total Score 32      PHQ2-9    Flowsheet Row Office Visit from 03/28/2021 in [facility] from 09/22/2020 in [facility] from 05/19/2020 in [facility] Visit from 02/22/2020 in [facility] from 12/12/2016 in Primary [facility] at [facility] Total Score 0 6 6 3  0  PHQ-9 Total Score 11 19 17 17  --      Flowsheet Row Admission (Discharged) from 08/17/2020 in [facility] Pre-Admission Testing 62 from 08/16/2020 in [facility] PREADMISSION TESTING  C-SSRS RISK CATEGORY No Risk No Risk        Maggie 58 and dating.  Past Psychiatric Medication Trials: Failed ECT and TMS,  Spravato amitriptyline helped for 2 to 3 years in 1987,   2021 No benefit from amitriptyline 300 mg with adequate duration.  Nortriptyline  200 with a therapeutic blood level. Trintellix, duloxetine 120, Lexapro 20, Fetzima,  Wellbutrin, fluoxetine, paroxetine, sertraline,  (Poor response to SSRIs) Viibryd 60 [place],  lithium SE,  Latuda 120, Abilify 15,  Vraylar, Rexulti,Seroquel 400, risperidone, Geodon,  Haldol for agitation, lamotrigine 300,  buspirone, clonazepam, gabapentin, propranolol modafinil, pramipexole,  Nuvigil, Vyvanse, Concerta SHE DOESN'T WANT STIMULANTS AGAIN bc of anxiety. Trazodone,   GENESIGHT COMPLETED  history of overdose on Xanax, There is a history of suicide attempts and psychiatric hospitalizations.  Review of Systems:  Review of Systems  Constitutional:  Positive for fatigue.  Cardiovascular:  Negative for chest pain and palpitations.  Gastrointestinal:  Negative for diarrhea.  Musculoskeletal:  Positive for back pain and myalgias. Negative for gait problem.       Less pain  lately.  Neurological:  Positive for tremors and weakness. Negative for dizziness.  Psychiatric/Behavioral:  Positive for dysphoric mood. Negative for confusion, decreased concentration, hallucinations and self-injury. The patient is nervous/anxious.   No changes  Medications: I have reviewed the patient's current medications.  Current Outpatient Medications  Medication Sig Dispense Refill   ampicillin (PRINCIPEN) 500 MG capsule Take 500 mg by mouth daily.     azelastine (ASTELIN) 0.1 % nasal spray Place 2 sprays into both nostrils 2 (two) times daily. 30 mL 12   celecoxib (CELEBREX) 200 MG capsule TAKE ONE CAPSULE TWICE DAILY FOR CHRONIC PAIN. 180 capsule 0   Cholecalciferol (VITAMIN D) 50 MCG (2000 UT) tablet Take 2,000 Units by mouth daily.     cloNIDine (CATAPRES) 0.1 MG tablet 1/2 tablet twice daily for 2-3 days then 1 twice daily 60 tablet 1   Continuous Blood Gluc Receiver (DEXCOM G6 RECEIVER) DEVI Use 4 times daily as needed to check blood sugar 1 each PRN   cyanocobalamin (,VITAMIN B-12,) 1000 MCG/ML  injection One Injection weekly for 4 weeks then one injection monthly for 3 month 7 mL 0   Empagliflozin-metFORMIN HCl ER (SYNJARDY XR) 25-1000 MG TB24 Take 1 tablet by mouth daily. 30 tablet 5   Esketamine HCl, 84 MG Dose, (SPRAVATO, 84 MG DOSE,) 28 MG/DEVICE SOPK Place 84 mg into the nose See admin instructions. Use twice weekly for 4 weeks then reduce to once weekly     gabapentin (NEURONTIN) 300 MG capsule Take 2 capsules in the morning, 1 in the middle of the day, and 2 at night. 150 capsule 3   Insulin Pen Needle (PEN NEEDLES) 33G X 4 MM MISC Use daily as needed to inject insulin 100 each 1   lithium carbonate (ESKALITH) 450 MG CR tablet TAKE TWO TABLETS BY MOUTH AT BEDTIME 180 tablet 0   MOUNJARO 15 MG/0.5ML Pen Inject 15 mg into the skin once a week. 6 mL 0   Multiple Vitamin (MULTIVITAMIN WITH MINERALS) TABS tablet Take 1 tablet by mouth daily.     OLANZapine (ZYPREXA) 15 MG tablet Take 1 tablet (15 mg total) by mouth at bedtime. 30 tablet 1   pantoprazole (PROTONIX) 40 MG tablet TAKE ONE TABLET BY MOUTH ONCE DAILY FOR STOMACH PROTECTION 30 tablet 5   propranolol (INDERAL) 40 MG tablet take ONE TO TWO tablets every EIGHT hours FOR anxiety 150 tablet 0   SYNJARDY XR 12.07-998 MG TB24 TAKE TWO TABLETS BY MOUTH DAILY 30 tablet 5   TRESIBA FLEXTOUCH 100 UNIT/ML FlexTouch Pen INJECT 80 UNITS SUBCUTANEOUSLY AT BEDTIME 24 mL 2   tretinoin (RETIN-A) 0.1 % cream Apply 1 application topically at bedtime.     Vilazodone HCl (VIIBRYD) 40 MG TABS TAKE 1 & 1/2 TABLETS BY MOUTH DAILY 45 tablet 0   Vitamin D, Ergocalciferol, (DRISDOL) 1.25 MG (50000 UNIT) CAPS capsule Take 1 capsule (50,000 Units total) by mouth every 7 (seven) days. 15 capsule 3   XARELTO 20 MG TABS tablet Take 1 tablet (20 mg total) by mouth daily with supper. 30 tablet 5   No current facility-administered medications for this visit.    Medication Side Effects: Sedation  Allergies:  Allergies  Allergen Reactions   Tramadol Other  (See Comments)    Tingling all over    Hydrocodone     Ineffective     Past Medical History:  Diagnosis Date   Anemia    Anxiety    Asthma    Depression  Diabetes mellitus type 2 in obese (HCC) 06/11/2016   Dyspnea    Embolism - blood clot January 1997 & January 2017   Also had LLE DVT in 1997   History of kidney stones    Hypertension    Preeclampsia 10/10/2011   1999    Sleep apnea     Family History  Problem Relation Age of Onset   Dementia Father    Heart disease Father    Clotting disorder Mother    Arthritis Mother    Clotting disorder Sister    Clotting disorder Maternal Grandmother    Clotting disorder Maternal Aunt    Rheumatologic disease Neg Hx    Hyperparathyroidism Neg Hx     Social History   Socioeconomic History   Marital status: Married    Spouse name: Not on file   Number of children: Y   Years of education: Not on file   Highest education level: Not on file  Occupational History   Occupation: admin assist    Comment: insurance verification  Tobacco Use   Smoking status: Former    Packs/day: 1.00    Years: 20.00    Pack years: 20.00    Types: Cigarettes    Quit date: 03/12/2004    Years since quitting: 17.1   Smokeless tobacco: Never  Vaping Use   Vaping Use: Never used  Substance and Sexual Activity   Alcohol use: Yes    Alcohol/week: 1.0 standard drink    Types: 1 Cans of beer per week    Comment: 1-2 times a week   Drug use: No   Sexual activity: Yes    Partners: Male    Birth control/protection: None  Other Topics Concern   Not on file  Social History Narrative   Originally from [place]. Always lived in [place]. 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 No international travel recently. Previously has been to [place] in May 1996. No mold exposure recently. No bird exposure. Does have multiple pets.    Social Determinants of Health   Financial Resource Strain: Not on file  Food Insecurity: Not on file  Transportation Needs:  Not on file  Physical Activity: Not on file  Stress: Not on file  Social Connections: Not on file  Intimate Partner Violence: Not on file    Past Medical History, Surgical history, Social history, and Family history were reviewed and updated as appropriate.   Please see review of systems for further details on the patient's review from today.   Objective:   Physical Exam:  LMP  (LMP Unknown) Comment: last period ---  1 year ago (?)  Physical Exam Constitutional:      General: She is not in acute distress. Musculoskeletal:        General: No deformity.  Neurological:     Mental Status: She is alert and oriented to person, place, and time.     Cranial Nerves: No dysarthria.     Coordination: Coordination normal.  Psychiatric:        Attention and Perception: Attention and perception normal. She does not perceive auditory or visual hallucinations.        Mood and Affect: Mood is anxious and depressed. Affect is not labile, blunt, angry, tearful or inappropriate.        Speech: Speech normal.        Behavior: Behavior normal. Behavior is cooperative.        Thought Content: Thought content normal. Thought content is not paranoid or  delusional. Thought content does not include homicidal or suicidal ideation. Thought content does not include suicidal plan.        Cognition and Memory: Cognition and memory normal.        Judgment: Judgment normal.     Comments: Insight intact  Less depressed with Spravato but not gone   Lab Review:     Component Value Date/Time   NA 140 11/22/2020 1223   NA 138 08/21/2019 0000   NA 140 12/11/2016 1236   K 4.1 11/22/2020 1223   K 4.2 12/11/2016 1236   CL 104 11/22/2020 1223   CO2 26 11/22/2020 1223   CO2 24 12/11/2016 1236   GLUCOSE 151 (H) 11/22/2020 1223   GLUCOSE 109 12/11/2016 1236   BUN 8 11/22/2020 1223   BUN 12 08/21/2019 0000   BUN 15.1 12/11/2016 1236   CREATININE 0.66 11/22/2020 1223   CREATININE 0.68 02/22/2020 1649    CREATININE 0.8 12/11/2016 1236   CALCIUM 9.6 11/22/2020 1223   CALCIUM 10.0 12/11/2016 1236   PROT 6.9 11/22/2020 1223   PROT 7.5 12/11/2016 1236   ALBUMIN 4.3 11/22/2020 1223   ALBUMIN 4.4 12/11/2016 1236   AST 40 (H) 11/22/2020 1223   AST 38 (H) 12/11/2016 1236   ALT 40 (H) 11/22/2020 1223   ALT 61 (H) 12/11/2016 1236   ALKPHOS 65 11/22/2020 1223   ALKPHOS 71 12/11/2016 1236   BILITOT 0.7 11/22/2020 1223   BILITOT 0.62 12/11/2016 1236   GFRNONAA >60 08/16/2020 1157   GFRNONAA 105 12/11/2018 1121   GFRAA 122 12/11/2018 1121       Component Value Date/Time   WBC 7.4 11/22/2020 1223   RBC 4.88 11/22/2020 1223   HGB 13.1 11/22/2020 1223   HGB 14.5 12/11/2016 1236   HCT 41.4 11/22/2020 1223   HCT 43.9 12/11/2016 1236   PLT 262.0 11/22/2020 1223   PLT 289 12/11/2016 1236   PLT 295 07/24/2016 1848   MCV 85.0 11/22/2020 1223   MCV 93.2 12/11/2016 1236   MCH 29.9 08/16/2020 1157   MCHC 31.6 11/22/2020 1223   RDW 16.4 (H) 11/22/2020 1223   RDW 13.5 12/11/2016 1236   LYMPHSABS 2.2 11/22/2020 1223   LYMPHSABS 2.4 12/11/2016 1236   MONOABS 0.6 11/22/2020 1223   MONOABS 0.7 12/11/2016 1236   EOSABS 0.1 11/22/2020 1223   EOSABS 0.7 (H) 12/11/2016 1236   BASOSABS 0.0 11/22/2020 1223   BASOSABS 0.0 12/11/2016 1236    Lithium Lvl  Date Value Ref Range Status  11/22/2020 0.7 0.6 - 1.2 mmol/L Final   11/22/20  lithium 0.7 on 900   Serum nortriptyline level on 150 mg a day was 109  Amitriptyline  Level 12/11/2018 =237  On 250 mg daily,.  12/23/2020 vitamin D level 31.38, vitamin B12 519  No results found for: PHENYTOIN, PHENOBARB, VALPROATE, CBMZ   .res Assessment: Plan:    Recurrent major depression resistant to treatment (HCC)  Generalized anxiety disorder - Plan: cloNIDine (CATAPRES) 0.1 MG tablet  Hypersomnolence  Diffuse pain  Obstructive sleep apnea  Lithium-induced tremor  Low vitamin D level  Lithium use   Roshelle has chronic severe major depression  and generalized anxiety that is treatment resistant and failed multiple medications ECT and TMS as noted above including SSRIs, try cyclic's, lithium, and atypical antipsychotics for depression.. She has had partial benefit from amitriptyline plus lithium.  However  tremor problems from the lithium.  Efforts to reduce the lithium have led to worsening psychiatric symptoms.  There has been a discussion about possible bipolar elements but she has no clear manic episodes unrelated to medication changes.  She has features of atypical depression.  So an MAO inhibitor is attractive from that perspective.  Discussed Spravato option in detail.  She wants to transition Spravato back to this office. Continue Spravato twice  weekly Dr. Evelene CroonKaur.  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  Initially less anxiety with it.  Statistically speaking the most potentially beneficial option would be Parnate or Nardil but they are difficult to use  Consider Auvelity.  Need to take Viibryd 60 with food emphasized.  She's improving but not consistent.  check lithium level 0.7 on 900 mg.  continue  Rec counseling.  She's back seeing Kennon RoundsChris Spalding.. Needs more purpose in her day. She needs a fulfilling job.   Encourage physical activity and weight loss.  She is not particularly motivated and feels that is hard to accomplish because of chronic pain.  Chronic pain complicates her treatment of depression.  Recent B12 deficiency dx and treatment just started. Increase vitamin D bc level 31 on 2000U daily  Hold B6 for lithium tremor. 500 mg BID. If needed.  Tremor is better.  None currently.  Try to take lithium also 4-5 hours before sleep to minimize daytime tremor. depression was worse after reducing the lithium.  Irritability was also worse after reducing the lithium to 600 mg daily.   Disc weith loss meds Monjarou recently started for DM and goals.  Last checked lost 17#.  BS is OK.  Has  continuous glucose monitor.  Follow-up 8 weeks  Meredith Staggersarey Cottle MD, [name]  Future Appointments  Date Time Provider Department Center  06/26/2021 10:00 AM Ardith DarkParker, Caleb M, MD [facility]    No orders of the defined types were placed in this encounter.      -------------------------------

## 2021-05-04 DIAGNOSIS — F332 Major depressive disorder, recurrent severe without psychotic features: Secondary | ICD-10-CM | POA: Diagnosis not present

## 2021-05-08 DIAGNOSIS — F332 Major depressive disorder, recurrent severe without psychotic features: Secondary | ICD-10-CM | POA: Diagnosis not present

## 2021-05-09 DIAGNOSIS — F332 Major depressive disorder, recurrent severe without psychotic features: Secondary | ICD-10-CM | POA: Diagnosis not present

## 2021-05-11 DIAGNOSIS — F332 Major depressive disorder, recurrent severe without psychotic features: Secondary | ICD-10-CM | POA: Diagnosis not present

## 2021-05-15 DIAGNOSIS — F332 Major depressive disorder, recurrent severe without psychotic features: Secondary | ICD-10-CM | POA: Diagnosis not present

## 2021-05-17 DIAGNOSIS — F332 Major depressive disorder, recurrent severe without psychotic features: Secondary | ICD-10-CM | POA: Diagnosis not present

## 2021-05-19 ENCOUNTER — Ambulatory Visit: Payer: BC Managed Care – PPO | Admitting: Family Medicine

## 2021-05-22 DIAGNOSIS — F332 Major depressive disorder, recurrent severe without psychotic features: Secondary | ICD-10-CM | POA: Diagnosis not present

## 2021-05-23 DIAGNOSIS — F332 Major depressive disorder, recurrent severe without psychotic features: Secondary | ICD-10-CM | POA: Diagnosis not present

## 2021-05-25 DIAGNOSIS — F332 Major depressive disorder, recurrent severe without psychotic features: Secondary | ICD-10-CM | POA: Diagnosis not present

## 2021-05-28 ENCOUNTER — Other Ambulatory Visit: Payer: Self-pay | Admitting: Psychiatry

## 2021-05-28 DIAGNOSIS — F332 Major depressive disorder, recurrent severe without psychotic features: Secondary | ICD-10-CM

## 2021-05-28 DIAGNOSIS — F411 Generalized anxiety disorder: Secondary | ICD-10-CM

## 2021-05-29 DIAGNOSIS — F332 Major depressive disorder, recurrent severe without psychotic features: Secondary | ICD-10-CM | POA: Diagnosis not present

## 2021-05-29 NOTE — Telephone Encounter (Signed)
Please call to schedule an appt. Last seen 2/22 with RTC in 8 weeks.  ?

## 2021-05-30 DIAGNOSIS — F332 Major depressive disorder, recurrent severe without psychotic features: Secondary | ICD-10-CM | POA: Diagnosis not present

## 2021-06-01 ENCOUNTER — Other Ambulatory Visit: Payer: Self-pay | Admitting: Family Medicine

## 2021-06-05 ENCOUNTER — Other Ambulatory Visit: Payer: Self-pay | Admitting: Family Medicine

## 2021-06-05 DIAGNOSIS — M797 Fibromyalgia: Secondary | ICD-10-CM

## 2021-06-06 DIAGNOSIS — F332 Major depressive disorder, recurrent severe without psychotic features: Secondary | ICD-10-CM | POA: Diagnosis not present

## 2021-06-08 DIAGNOSIS — F332 Major depressive disorder, recurrent severe without psychotic features: Secondary | ICD-10-CM | POA: Diagnosis not present

## 2021-06-12 DIAGNOSIS — F332 Major depressive disorder, recurrent severe without psychotic features: Secondary | ICD-10-CM | POA: Diagnosis not present

## 2021-06-15 DIAGNOSIS — F332 Major depressive disorder, recurrent severe without psychotic features: Secondary | ICD-10-CM | POA: Diagnosis not present

## 2021-06-19 DIAGNOSIS — F332 Major depressive disorder, recurrent severe without psychotic features: Secondary | ICD-10-CM | POA: Diagnosis not present

## 2021-06-20 ENCOUNTER — Ambulatory Visit: Payer: BC Managed Care – PPO | Admitting: Family Medicine

## 2021-06-20 ENCOUNTER — Encounter: Payer: Self-pay | Admitting: Family Medicine

## 2021-06-20 VITALS — BP 113/79 | HR 87 | Temp 98.6°F | Ht 68.0 in | Wt 229.1 lb

## 2021-06-20 DIAGNOSIS — L600 Ingrowing nail: Secondary | ICD-10-CM | POA: Diagnosis not present

## 2021-06-20 DIAGNOSIS — F332 Major depressive disorder, recurrent severe without psychotic features: Secondary | ICD-10-CM | POA: Diagnosis not present

## 2021-06-20 MED ORDER — SULFAMETHOXAZOLE-TRIMETHOPRIM 800-160 MG PO TABS: 1.0000 | ORAL_TABLET | Freq: Two times a day (BID) | ORAL | 0 refills | Status: DC

## 2021-06-20 NOTE — Progress Notes (Signed)
? ?Subjective:  ? ? ? Patient ID: Deborah Soto, female    DOB: 01-Jun-1965, 56 y.o.   MRN: [id number] ? ?Chief Complaint  ?Patient presents with  ? Toe Pain  ?  Pt c/o pain in right big toe. Redness and hurt to touch. Has tried peroxide helped then started getting worse again.   ? ? ?HPI-sugars hi past 1-2 wks-200, 185 -better during day-not on insulin at all.  ?Pain R great toe 4-6 wks.  Redness and tender to touch.  Has tried H2O2 and abx oint, but past 2 wks getting worse.  Some pus/blood.    No f/c.   Gets nails done q 6 wks-last time <1 wk ago.   Not soaking it.   Already on ampicillin for rosacea ? ?Health Maintenance Due  ?Topic Date Due  ? PAP SMEAR-Modifier  Never done  ? ? ?Past Medical History:  ?Diagnosis Date  ? Anemia   ? Anxiety   ? Asthma   ? Depression   ? Diabetes mellitus type 2 in obese (HCC) 06/11/2016  ? Dyspnea   ? Embolism - blood clot January 1997 & January 2017  ? Also had LLE DVT in 1997  ? History of kidney stones   ? Hypertension   ? Preeclampsia 10/10/2011  ? 1999   ? Sleep apnea   ? ? ?Past Surgical History:  ?Procedure Laterality Date  ? ANTERIOR CERVICAL DECOMPRESSION/DISCECTOMY FUSION 4 LEVELS N/A 08/17/2020  ? Procedure: ANTERIOR CERVICAL DECOMPRESSION FUSION CERVICAL THREE - CERVICAL FOUR, CERVICAL FOUR- CERVICAL FIVE, CERVICAL FIVE- CERVICAL SIX WITH INSTRUMENTATION AND ALLOGRAFT;  Surgeon: Estill Bamberg, MD;  Location: [facility] OR;  Service: Orthopedics;  Laterality: N/A;  ? KIDNEY STONE SURGERY    ? ? ?Outpatient Medications Prior to Visit  ?Medication Sig Dispense Refill  ? ampicillin (PRINCIPEN) 500 MG capsule Take 500 mg by mouth daily.    ? celecoxib (CELEBREX) 200 MG capsule TAKE ONE CAPSULE TWICE DAILY FOR CHRONIC PAIN. 180 capsule 0  ? Cholecalciferol (VITAMIN D) 50 MCG (2000 UT) tablet Take 2,000 Units by mouth daily.    ? cloNIDine (CATAPRES) 0.1 MG tablet TAKE ONE TABLET TWICE DAILY 120 tablet 0  ? Continuous Blood Gluc Receiver (DEXCOM G6 RECEIVER) DEVI Use 4 times daily  as needed to check blood sugar 1 each PRN  ? Continuous Blood Gluc Sensor (DEXCOM G6 SENSOR) MISC Apply topically 4 (four) times daily.    ? cyanocobalamin (,VITAMIN B-12,) 1000 MCG/ML injection One Injection weekly for 4 weeks then one injection monthly for 3 month 7 mL 0  ? Empagliflozin-metFORMIN HCl ER (SYNJARDY XR) 25-1000 MG TB24 Take 1 tablet by mouth daily. 30 tablet 5  ? Esketamine HCl, 84 MG Dose, (SPRAVATO, 84 MG DOSE,) 28 MG/DEVICE SOPK Place 84 mg into the nose See admin instructions. Use twice weekly for 4 weeks then reduce to once weekly    ? gabapentin (NEURONTIN) 300 MG capsule Take 2 capsules in the morning, 1 in the middle of the day, and 2 at night. 150 capsule 3  ? Insulin Pen Needle (PEN NEEDLES) 33G X 4 MM MISC Use daily as needed to inject insulin 100 each 1  ? lithium carbonate (ESKALITH) 450 MG CR tablet TAKE TWO TABLETS BY MOUTH AT BEDTIME 180 tablet 0  ? MOUNJARO 15 MG/0.5ML Pen INJECT 0.5 MLS (15 MG) SUBCUTANEOUSLY ONCE A WEEK 6 mL 0  ? Multiple Vitamin (MULTIVITAMIN WITH MINERALS) TABS tablet Take 1 tablet by mouth daily.    ?  OLANZapine (ZYPREXA) 15 MG tablet TAKE ONE TABLET BY MOUTH AT BEDTIME 30 tablet 1  ? pantoprazole (PROTONIX) 40 MG tablet TAKE ONE TABLET BY MOUTH ONCE DAILY FOR STOMACH PROTECTION 30 tablet 5  ? propranolol (INDERAL) 40 MG tablet take ONE TO TWO tablets every EIGHT hours FOR anxiety 150 tablet 0  ? SYNJARDY XR 12.07-998 MG TB24 TAKE TWO TABLETS BY MOUTH DAILY 30 tablet 5  ? TRESIBA FLEXTOUCH 100 UNIT/ML FlexTouch Pen INJECT 80 UNITS SUBCUTANEOUSLY AT BEDTIME 24 mL 2  ? tretinoin (RETIN-A) 0.1 % cream Apply 1 application topically at bedtime.    ? Vilazodone HCl (VIIBRYD) 40 MG TABS TAKE 1 & 1/2 TABLETS BY MOUTH DAILY 45 tablet 1  ? XARELTO 20 MG TABS tablet Take 1 tablet (20 mg total) by mouth daily with supper. 30 tablet 5  ? Vitamin D, Ergocalciferol, (DRISDOL) 1.25 MG (50000 UNIT) CAPS capsule Take 1 capsule (50,000 Units total) by mouth every 7 (seven) days.  15 capsule 3  ? azelastine (ASTELIN) 0.1 % nasal spray Place 2 sprays into both nostrils 2 (two) times daily. (Patient not taking: Reported on 06/20/2021) 30 mL 12  ? ?No facility-administered medications prior to visit.  ? ? ?Allergies  ?Allergen Reactions  ? Tramadol Other (See Comments)  ?  Tingling all over   ? Hydrocodone   ?  Ineffective   ? ?ROS neg/noncontributory except as noted HPI/below ? ? ?   ?Objective:  ?  ? ?BP 113/79 (BP Location: Left Arm, Patient Position: Sitting, Cuff Size: Large)   Pulse 87   Temp 98.6 ?F (37 ?C) (Temporal)   Ht 5\' 8"  (1.727 m)   Wt 229 lb 2 oz (103.9 kg)   LMP  (LMP Unknown) Comment: last period ---  1 year ago (?)  SpO2 96%   BMI 34.84 kg/m?  ?Wt Readings from Last 3 Encounters:  ?06/20/21 229 lb 2 oz (103.9 kg)  ?03/28/21 240 lb 3.2 oz (109 kg)  ?01/18/21 245 lb 8 oz (111.4 kg)  ? ? ?Physical Exam  ? ?Gen: WDWN NAD ?HEENT: NCAT, conjunctiva not injected, sclera nonicteric ?EXT:  no edema.  DP 2+ ?MSK: no gross abnormalities.  ?NEURO: A&O x3.  CN II-XII intact.  ?PSYCH: normal mood. Good eye contact ?R great toenail-redness around it.  Tender medial side.  Some dried crust-can't express anything now. ? ?   ?Assessment & Plan:  ? ?Problem List Items Addressed This Visit   ?None ?Visit Diagnoses   ? ? Ingrown toenail of right foot with infection    -  Primary  ? Relevant Medications  ? sulfamethoxazole-trimethoprim (BACTRIM DS) 800-160 MG tablet  ? ?  ? Ingrown R great toenail w/infection.  Already on ampicillin for rosacea.  Will try bactrim.  Epsom salt soaks.  Worse, going to prox toe, let 13/09/22 know.   Declines pod for now.  Monitor closely ? ?Meds ordered this encounter  ?Medications  ? sulfamethoxazole-trimethoprim (BACTRIM DS) 800-160 MG tablet  ?  Sig: Take 1 tablet by mouth 2 (two) times daily.  ?  Dispense:  20 tablet  ?  Refill:  0  ? ? ?[name], MD ? ?

## 2021-06-20 NOTE — Patient Instructions (Signed)
It was very nice to see you today! ? ?Soaks in epsom salts.  If worse, let us know.  Possible podiatrist ? ? ?PLEASE NOTE: ? ?If you had any lab tests please let us know if you have not heard back within a few days. You may see your results on MyChart before we have a chance to review them but we will give you a call once they are reviewed by [name]. If we ordered any referrals today, please let us know if you have not heard from their office within the next week.  ? ?Please try these tips to maintain a healthy lifestyle: ? ?Eat most of your calories during the day when you are active. Eliminate processed foods including packaged sweets (pies, cakes, cookies), reduce intake of potatoes, white bread, white pasta, and white rice. Look for whole grain options, oat flour or almond flour. ? ?Each meal should contain half fruits/vegetables, one quarter protein, and one quarter carbs (no bigger than a computer mouse). ? ?Cut down on sweet beverages. This includes juice, soda, and sweet tea. Also watch fruit intake, though this is a healthier sweet option, it still contains natural sugar! Limit to 3 servings daily. ? ?Drink at least 1 glass of water with each meal and aim for at least 8 glasses per day ? ?Exercise at least 150 minutes every week.   ?

## 2021-06-22 DIAGNOSIS — F332 Major depressive disorder, recurrent severe without psychotic features: Secondary | ICD-10-CM | POA: Diagnosis not present

## 2021-06-26 ENCOUNTER — Other Ambulatory Visit: Payer: Self-pay | Admitting: Psychiatry

## 2021-06-26 ENCOUNTER — Other Ambulatory Visit: Payer: Self-pay | Admitting: Family Medicine

## 2021-06-26 ENCOUNTER — Ambulatory Visit: Payer: BC Managed Care – PPO | Admitting: Family Medicine

## 2021-06-26 VITALS — BP 104/73 | HR 91 | Temp 97.9°F | Ht 68.0 in | Wt 228.8 lb

## 2021-06-26 DIAGNOSIS — F332 Major depressive disorder, recurrent severe without psychotic features: Secondary | ICD-10-CM

## 2021-06-26 DIAGNOSIS — E669 Obesity, unspecified: Secondary | ICD-10-CM

## 2021-06-26 DIAGNOSIS — E1169 Type 2 diabetes mellitus with other specified complication: Secondary | ICD-10-CM | POA: Diagnosis not present

## 2021-06-26 LAB — POCT GLUCOSE (DEVICE FOR HOME USE): Glucose Fasting, POC: 138 mg/dL — AB (ref 70–99)

## 2021-06-26 LAB — POCT GLYCOSYLATED HEMOGLOBIN (HGB A1C): Hemoglobin A1C: 6.3 % — AB (ref 4.0–5.6)

## 2021-06-26 NOTE — Assessment & Plan Note (Signed)
A1c is very well controlled at 6.3.  Her home glucose monitors are reading above ours by 20-30 points.  She is currently on Mounjaro 15units weekly and Synjardy 12 point 07-998 twice daily.  She is no longer on insulin.  Given her great A1c control we will continue current regimen for now.  We can recheck in 3 to 6 months. ?

## 2021-06-26 NOTE — Assessment & Plan Note (Signed)
Follows with psychiatry.  Mood has worsened recently though is stable.  She will follow-up with her psychiatrist soon. ?

## 2021-06-26 NOTE — Patient Instructions (Signed)
It was very nice to see you today! ? ?Your A1c and blood pressure look great today.  We will not make any medication changes. ? ?I will see back in 3 to 6 months.  Please come back to see [name] sooner if needed. ? ?Take care, ?Dr Jerline Pain ? ?PLEASE NOTE: ? ?If you had any lab tests please let us know if you have not heard back within a few days. You may see your results on mychart before we have a chance to review them but we will give you a call once they are reviewed by [name]. If we ordered any referrals today, please let us know if you have not heard from their office within the next week.  ? ?Please try these tips to maintain a healthy lifestyle: ? ?Eat at least 3 REAL meals and 1-2 snacks per day.  Aim for no more than 5 hours between eating.  If you eat breakfast, please do so within one hour of getting up.  ? ?Each meal should contain half fruits/vegetables, one quarter protein, and one quarter carbs (no bigger than a computer mouse) ? ?Cut down on sweet beverages. This includes juice, soda, and sweet tea.  ? ?Drink at least 1 glass of water with each meal and aim for at least 8 glasses per day ? ?Exercise at least 150 minutes every week.   ?

## 2021-06-26 NOTE — Progress Notes (Signed)
? ?  Deborah Soto is a 56 y.o. female who presents today for an office visit. ? ?Assessment/Plan:  ?Chronic Problems Addressed Today: ?Diabetes mellitus type 2 in obese [facility]) ?A1c is very well controlled at 6.3.  Her home glucose monitors are reading above ours by 20-30 points.  She is currently on Mounjaro 15units weekly and Synjardy 12 point 07-998 twice daily.  She is no longer on insulin.  Given her great A1c control we will continue current regimen for now.  We can recheck in 3 to 6 months. ? ?MDD (major depressive disorder), recurrent severe, without psychosis ([place]) ?Follows with psychiatry.  Mood has worsened recently though is stable.  She will follow-up with her psychiatrist soon. ? ?Morbid obesity ([place]) ?She is down about 12 pounds since her last visit.  Congratulated patient on weight loss.  Follow-up in 3 to 6 months. ? ? ?  ?Subjective:  ?HPI: ? ?See A/p for status of chronic conditions.   ? ?   ?  ?Objective:  ?Physical Exam: ?BP 104/73 (BP Location: Left Arm)   Pulse 91   Temp 97.9 ?F (36.6 ?C) (Temporal)   Ht 5\' 8"  (1.727 m)   Wt 228 lb 12.8 oz (103.8 kg)   LMP  (LMP Unknown) Comment: last period ---  1 year ago (?)  SpO2 93%   BMI 34.79 kg/m?   ?Wt Readings from Last 3 Encounters:  ?06/26/21 228 lb 12.8 oz (103.8 kg)  ?06/20/21 229 lb 2 oz (103.9 kg)  ?03/28/21 240 lb 3.2 oz (109 kg)  ?Gen: No acute distress, resting comfortably ?CV: Regular rate and rhythm with no murmurs appreciated ?Pulm: Normal work of breathing, clear to auscultation bilaterally with no crackles, wheezes, or rhonchi ?Neuro: Grossly normal, moves all extremities ?Psych: Normal affect and thought content ? ?   ? ?Algis Greenhouse. Jerline Pain, MD ?06/26/2021 10:46 AM  ?

## 2021-06-26 NOTE — Assessment & Plan Note (Signed)
She is down about 12 pounds since her last visit.  Congratulated patient on weight loss.  Follow-up in 3 to 6 months. ?

## 2021-06-27 DIAGNOSIS — F332 Major depressive disorder, recurrent severe without psychotic features: Secondary | ICD-10-CM | POA: Diagnosis not present

## 2021-06-29 DIAGNOSIS — F332 Major depressive disorder, recurrent severe without psychotic features: Secondary | ICD-10-CM | POA: Diagnosis not present

## 2021-07-03 DIAGNOSIS — F332 Major depressive disorder, recurrent severe without psychotic features: Secondary | ICD-10-CM | POA: Diagnosis not present

## 2021-07-04 DIAGNOSIS — F332 Major depressive disorder, recurrent severe without psychotic features: Secondary | ICD-10-CM | POA: Diagnosis not present

## 2021-07-06 DIAGNOSIS — F332 Major depressive disorder, recurrent severe without psychotic features: Secondary | ICD-10-CM | POA: Diagnosis not present

## 2021-07-13 DIAGNOSIS — F332 Major depressive disorder, recurrent severe without psychotic features: Secondary | ICD-10-CM | POA: Diagnosis not present

## 2021-07-17 ENCOUNTER — Ambulatory Visit: Payer: BC Managed Care – PPO | Admitting: Psychiatry

## 2021-07-17 ENCOUNTER — Encounter: Payer: Self-pay | Admitting: Psychiatry

## 2021-07-17 VITALS — BP 104/70 | HR 91

## 2021-07-17 DIAGNOSIS — G4733 Obstructive sleep apnea (adult) (pediatric): Secondary | ICD-10-CM | POA: Diagnosis not present

## 2021-07-17 DIAGNOSIS — G4710 Hypersomnia, unspecified: Secondary | ICD-10-CM | POA: Diagnosis not present

## 2021-07-17 DIAGNOSIS — F339 Major depressive disorder, recurrent, unspecified: Secondary | ICD-10-CM

## 2021-07-17 DIAGNOSIS — F411 Generalized anxiety disorder: Secondary | ICD-10-CM

## 2021-07-17 DIAGNOSIS — F332 Major depressive disorder, recurrent severe without psychotic features: Secondary | ICD-10-CM | POA: Diagnosis not present

## 2021-07-17 DIAGNOSIS — R52 Pain, unspecified: Secondary | ICD-10-CM

## 2021-07-17 MED ORDER — PROPRANOLOL HCL ER 160 MG PO CP24: 160.0000 mg | ORAL_CAPSULE | Freq: Every day | ORAL | 1 refills | Status: DC

## 2021-07-17 NOTE — Progress Notes (Signed)
Lexianna Weinrich ?[id number] ?1965/05/26 ?56 y.o.  ? ? ? ?Subjective:  ? ?Patient ID:  Breanah Faddis is a 56 y.o. (DOB [birth year]) female. ? ?Chief Complaint:  ?Chief Complaint  ?Patient presents with  ? Follow-up  ? Depression  ? Anxiety  ? Fatigue  ? ?Depression ?       Associated symptoms include fatigue and myalgias.  Associated symptoms include no decreased concentration.   ?Tyniesha Howald presents to the office today for follow-up of TRD and anxiety, and insomnia. ? ?seen December 08, 2018.  She was complaining of lithium tremor and some cognitive problems.  Hydroxyzine was stopped.  Propranolol increased to 60 mg twice daily for tremor.  Lithium level and amitriptyline levels were requested. ?Amitriptyline level to 37, nortriptyline 49 on amitriptyline 250 mg nightly. ?Lithium level 0.800 mg daily ? ?Phone call on October 6 to discuss lab results as follows: ?Note ?  ?----- Message from [name]., MD sent at 12/15/2018 10:08 AM EDT ----- ?Lithium level stable at 0.8 in a good range.  The previous was 0.7.  She is having some tremor issues. ?  ?Normal BMP including excellent creatinine and calcium levels.  Blood sugar is high but she is aware of that problem. ?  ?TSH is within normal limits. ?  ?Serum amitriptyline level is 286 which is at the upper end of the normal range and suggestive that we not try further increase.  We will discuss further options at her follow-up appointment. ?  ?No medication adjustments required, but because of her tremor and her lithium level is slightly higher than it was with the prior level if she wants to reduce the lithium from 3 of the 300 mg tablets daily to 2-1/2 of the 300 mg tablets daily she can do so.  At her last appointment she was also encouraged to try a higher dose of propranolol and that may have resolved the problem.  If it did do not reduce the lithium but if it did not and she wants to reduce the dose she can do so as noted.  She should call us if she  has any recurrence of symptoms. ?  ?[name], [name]  ?  ? ?She had repeat lithium level at 750 mg daily of 0.  7 with some improvement in tremor.  In phone call on November 20 she reported mood was worse with weepiness, overreacting, "pity party".  Because mood symptoms were worse with the reduction lithium she was encouraged to increase the dosage back to 900 mg daily.  ? ?Last seen February 04, 2019.  The following changes were made: ?Increase propranolol to 80 mg twice daily for lithium tremor and anxiety her blood pressure and pulse appear high enough that she can tolerate this dosage.  Disc Se.  Disc risk with low BP and DM.  He has not been having any problems with low blood sugar. ?Amitriptyline 6 or 300 mg daily if tolerated.  Try to take some about 4 -5 hours before sleep ?Try to take lithium also 4-5 hours before sleep to minimize daytime tremor ?Add methylphenidate ER 27 mg in the morning.  If no response we will increase the dosage. ? ?Seen May 08, 2019 with her husband. ?Aside from physically feeling like she can't do much then having memory issues.  Forgetful.  Loses track of thoughts between tasks.  Most embarrassing being around people and word-finding issues.   ?Problems with walking bc balance and weakness.  Fears falling.  Only fall at Xmas morning.  Tremor is awful.  Affects keying.   ?H notices cognitive problems and forgetfulness.   ? ?Shaking got really bad and got lithium level as low as 600 mg but the next day felt really helpless and everything seemed to be aimed at hurting me and felt disrespected.  Problems with boss and 56 yo taking 5 AP classes and 2 volleyball classes.  I've been on her to accomplish all these things.  She claimed that pt screaming at her.  ? H CO she was more irritable after the reduction in lithium.   H CO her anxiety also seems worse.  H says the last year has been unhealthy with 12 hour work days for her.   ?Doesn't go out much.   ? ?Attention problems  more noticeable. ? ?Still dropping and tremor.  Tolerated the increase in propranolol.   ? ?CO shakiness from lithium which interferes with typing. Also a good bit of anxiety generally without panic.  BS are high.  NotTaking hydroxyzine.  ? ?sleeping better and still using CPAP.  Using CPAP regularly. Less awakening than before.  No SE. depression was worse after reducing the lithium.  Irritability was also worse after reducing the lithium to 600 mg daily. ? ?seen May 08, 2019.  Multiple changes made:  ?Reduce propranolol to 60 mg twice daily for lithium tremor and anxiety since the increase was not helpful.   ?Restart B6 for lithium tremor. 500 mg BID. She forgot and doesn't want more. ?Try to take lithium also 4-5 hours before sleep to minimize daytime tremor. depression was worse after reducing the lithium.  Irritability was also worse after reducing the lithium to 600 mg daily. So reluctant to reduce it further.  It is unclear how to address the benefits of lithium without using lithium other than to consider Spravato ? Increase methylphenidate ER 36 mg in the morning. Increase methylphenidate ER 36 mg in the morning. ? Wait 1-2 weeks then reduce amitriptyline to 5 tablets nightly to see if cognition is better. ?Also saw Dr. Richardean Chimeraohmeir re: sleep disorder. ? ?05/2019 appt with the following noted: ?Lost Rx of Concerta 36 so didn't take it long.  Out of it.  Still shakey and cognitive problems.  Dep 5/10/  Anxiety 6/10 worse at night.  No SI.  8 hours sleep.  Mild panic couple times daily. ?Frustrated with tremor and sleepiness which is worse than tiredness.   ?History of low vitamin D taking 150K/week but stopped 6 mos ago. ?Plan : Modafinil 100 mg tablet 1 dailly for 1 week, ?Then add Concerta 36 mg 1 each AM.  ?Option increase modafinil mid April ? Restart B6 for lithium tremor. 500 mg BID. ?Get lab test at earliest convience ? ?10/14/2019 appointment with the following noted: ?Has been on amitriptyline 250 mg  for mos.  Tried a week ago reducing to 200 and felt worse so back on 250. ?Weaned off Lyrica about a month ago.  Not much change off it.   Not sure if it helped energy or alertness. ?Saw Dr. Nonah MattesExert in consultation. ?Mental clarity is a lot better with stimulants and not sleeping as much in daytime with current meds.  Severe drowsiness is a lot better.   ?Off metformin and added Glipizide.   ?Not great with depression 5/10.  Anxiety can trigger problems with thinking and somatic sx   I feel like it's something physical and can keep her up at night. ?Occ racing heart or  feeling hot.  Caffeine variable.  ?Plan:Plan: ?Increase methylphenidate from 36 to 54 mg in the morning. ?Increase amitriptyline to 6 tablets in the evening   ? ?11/12/19 TC noting call: Pt called stated she's not feeling well at all. Ask for sooner apt than 10/7. Depression, anxiety & focus is much worse. Has not planned to hurt herself but thoughts of things would be better if I was not here have crossed her mind often. Contact ASAP @ [phone].  I put Pt on canc list  ?MD response: I reviewed the meds and previous med lists.  The next options are either Spravato or a major med change from amitriptyline which is not something I can do outside of an appt.  She's on cancellation list and we'll try to work her in sooner.  Nothing I can change right now except since the increase in amitriptyline has not helped, she can reduce it to 200 mg nightly in preparation for other changes to come. ?Nursing response after talking with pt: Discussed symptoms and medications with patient, she did not increase her Amitriptyline 50 mg up to 6 tablets so she will start that today. Said you may have told her that but she didn't remember. She has read about Spravato but she is not wanting to proceed with that. Informed her she's on cancellation list as well. She will call back with worsening signs or symptoms. ? ?11/23/19 appt with the following noted: ?Forgot to increase  amitriptyline until noted. Tolerating it OK but hasn't had time to help. ?More alert with Concerta increase to 54 mg daily also with modafinil.  A little more focused and better productivity.  Still for

## 2021-07-17 NOTE — Patient Instructions (Signed)
Stop regular propranolol and start ER propranolol ER 1 at night ?Reduce clonidine to 1/2 tablet twice daily ?Wait 1 week and reduce olanzapine to 1/2 tablet nightly to see if energy, alertness and balance are better. ?Starting June 1 stop clonidine. ? ?

## 2021-07-18 DIAGNOSIS — F332 Major depressive disorder, recurrent severe without psychotic features: Secondary | ICD-10-CM | POA: Diagnosis not present

## 2021-07-19 DIAGNOSIS — F332 Major depressive disorder, recurrent severe without psychotic features: Secondary | ICD-10-CM | POA: Diagnosis not present

## 2021-07-24 ENCOUNTER — Other Ambulatory Visit: Payer: Self-pay | Admitting: Psychiatry

## 2021-07-24 ENCOUNTER — Other Ambulatory Visit: Payer: Self-pay | Admitting: Family Medicine

## 2021-07-24 DIAGNOSIS — F332 Major depressive disorder, recurrent severe without psychotic features: Secondary | ICD-10-CM

## 2021-07-24 DIAGNOSIS — F411 Generalized anxiety disorder: Secondary | ICD-10-CM

## 2021-07-27 DIAGNOSIS — F332 Major depressive disorder, recurrent severe without psychotic features: Secondary | ICD-10-CM | POA: Diagnosis not present

## 2021-07-31 DIAGNOSIS — F332 Major depressive disorder, recurrent severe without psychotic features: Secondary | ICD-10-CM | POA: Diagnosis not present

## 2021-08-01 DIAGNOSIS — F332 Major depressive disorder, recurrent severe without psychotic features: Secondary | ICD-10-CM | POA: Diagnosis not present

## 2021-08-03 DIAGNOSIS — F332 Major depressive disorder, recurrent severe without psychotic features: Secondary | ICD-10-CM | POA: Diagnosis not present

## 2021-08-08 DIAGNOSIS — F332 Major depressive disorder, recurrent severe without psychotic features: Secondary | ICD-10-CM | POA: Diagnosis not present

## 2021-08-10 DIAGNOSIS — F332 Major depressive disorder, recurrent severe without psychotic features: Secondary | ICD-10-CM | POA: Diagnosis not present

## 2021-08-14 DIAGNOSIS — F332 Major depressive disorder, recurrent severe without psychotic features: Secondary | ICD-10-CM | POA: Diagnosis not present

## 2021-08-17 DIAGNOSIS — F332 Major depressive disorder, recurrent severe without psychotic features: Secondary | ICD-10-CM | POA: Diagnosis not present

## 2021-08-21 DIAGNOSIS — F332 Major depressive disorder, recurrent severe without psychotic features: Secondary | ICD-10-CM | POA: Diagnosis not present

## 2021-08-22 DIAGNOSIS — F332 Major depressive disorder, recurrent severe without psychotic features: Secondary | ICD-10-CM | POA: Diagnosis not present

## 2021-08-23 ENCOUNTER — Other Ambulatory Visit: Payer: Self-pay | Admitting: Psychiatry

## 2021-08-23 ENCOUNTER — Other Ambulatory Visit: Payer: Self-pay | Admitting: Family Medicine

## 2021-08-23 DIAGNOSIS — F411 Generalized anxiety disorder: Secondary | ICD-10-CM

## 2021-08-23 DIAGNOSIS — M797 Fibromyalgia: Secondary | ICD-10-CM

## 2021-08-24 ENCOUNTER — Other Ambulatory Visit: Payer: Self-pay | Admitting: Psychiatry

## 2021-08-24 DIAGNOSIS — F332 Major depressive disorder, recurrent severe without psychotic features: Secondary | ICD-10-CM

## 2021-08-24 DIAGNOSIS — F411 Generalized anxiety disorder: Secondary | ICD-10-CM

## 2021-08-25 ENCOUNTER — Other Ambulatory Visit: Payer: Self-pay | Admitting: Family Medicine

## 2021-08-28 ENCOUNTER — Telehealth: Payer: Self-pay | Admitting: *Deleted

## 2021-08-28 DIAGNOSIS — F332 Major depressive disorder, recurrent severe without psychotic features: Secondary | ICD-10-CM | POA: Diagnosis not present

## 2021-08-28 NOTE — Telephone Encounter (Signed)
(  Key: [id number]) PA was send today 08/28/2021 Ozempic (1 MG/DOSE) 4MG /3ML pen-injectors Message from Plan Effective from 08/28/2021 through 08/27/2022

## 2021-08-29 ENCOUNTER — Encounter: Payer: Self-pay | Admitting: Family Medicine

## 2021-08-29 DIAGNOSIS — F332 Major depressive disorder, recurrent severe without psychotic features: Secondary | ICD-10-CM | POA: Diagnosis not present

## 2021-08-30 NOTE — Telephone Encounter (Signed)
Please advise 

## 2021-09-01 NOTE — Telephone Encounter (Signed)
It is ok for her to take trulicity 4.5mg  weekly until her mounjaro comes back in stock.  Katina Degree. [name], MD 09/01/2021 4:01 PM  s

## 2021-09-04 DIAGNOSIS — F332 Major depressive disorder, recurrent severe without psychotic features: Secondary | ICD-10-CM | POA: Diagnosis not present

## 2021-09-05 ENCOUNTER — Ambulatory Visit: Payer: BC Managed Care – PPO | Admitting: Psychiatry

## 2021-09-05 ENCOUNTER — Encounter: Payer: Self-pay | Admitting: Psychiatry

## 2021-09-05 DIAGNOSIS — F411 Generalized anxiety disorder: Secondary | ICD-10-CM | POA: Diagnosis not present

## 2021-09-05 DIAGNOSIS — G4710 Hypersomnia, unspecified: Secondary | ICD-10-CM | POA: Diagnosis not present

## 2021-09-05 DIAGNOSIS — F339 Major depressive disorder, recurrent, unspecified: Secondary | ICD-10-CM | POA: Diagnosis not present

## 2021-09-05 DIAGNOSIS — G251 Drug-induced tremor: Secondary | ICD-10-CM

## 2021-09-05 DIAGNOSIS — R52 Pain, unspecified: Secondary | ICD-10-CM

## 2021-09-05 DIAGNOSIS — G4733 Obstructive sleep apnea (adult) (pediatric): Secondary | ICD-10-CM | POA: Diagnosis not present

## 2021-09-05 DIAGNOSIS — Z79899 Other long term (current) drug therapy: Secondary | ICD-10-CM

## 2021-09-05 DIAGNOSIS — R7989 Other specified abnormal findings of blood chemistry: Secondary | ICD-10-CM

## 2021-09-05 DIAGNOSIS — F332 Major depressive disorder, recurrent severe without psychotic features: Secondary | ICD-10-CM

## 2021-09-05 MED ORDER — OLANZAPINE 5 MG PO TABS: 5.0000 mg | ORAL_TABLET | Freq: Every day | ORAL | 1 refills | Status: DC

## 2021-09-07 DIAGNOSIS — F332 Major depressive disorder, recurrent severe without psychotic features: Secondary | ICD-10-CM | POA: Diagnosis not present

## 2021-09-14 DIAGNOSIS — F332 Major depressive disorder, recurrent severe without psychotic features: Secondary | ICD-10-CM | POA: Diagnosis not present

## 2021-09-19 DIAGNOSIS — F332 Major depressive disorder, recurrent severe without psychotic features: Secondary | ICD-10-CM | POA: Diagnosis not present

## 2021-09-22 ENCOUNTER — Telehealth: Payer: Self-pay | Admitting: Psychiatry

## 2021-09-22 ENCOUNTER — Other Ambulatory Visit: Payer: Self-pay | Admitting: Psychiatry

## 2021-09-22 ENCOUNTER — Other Ambulatory Visit: Payer: Self-pay | Admitting: Family Medicine

## 2021-09-22 DIAGNOSIS — F411 Generalized anxiety disorder: Secondary | ICD-10-CM

## 2021-09-22 DIAGNOSIS — F332 Major depressive disorder, recurrent severe without psychotic features: Secondary | ICD-10-CM

## 2021-09-22 MED ORDER — AUVELITY 45-105 MG PO TBCR: 1.0000 | EXTENDED_RELEASE_TABLET | Freq: Two times a day (BID) | ORAL | 1 refills | Status: DC

## 2021-09-22 NOTE — Telephone Encounter (Signed)
Deborah Soto called this morning at 11:25 to report that she is doing well on the Auvelity. She is almost out of samples.  Please call in a prescription to [facility].  She did say it is hard to know for sure how it is working with the spravato because she has a couple of sessions of her spravato treatment, but she would like to continue this medication.

## 2021-09-22 NOTE — Telephone Encounter (Signed)
Called patient and told them [facility] does not carry the medication and currently will not order it. Told her about [facility] and she is agreeable to have Rx sent there.

## 2021-09-22 NOTE — Telephone Encounter (Signed)
Rx sent 

## 2021-09-22 NOTE — Telephone Encounter (Signed)
Pt states pharmacy supply will take longer than 30 days to replenish. Pharmacy has 12.5 mg in stock, 15 mg is out.   Pt requests decision to be made about how to proceed: -Different dosage? -Different Medication?   First missed injection: 09/21/21   Preferred pharmacy: [facility] [place], [place] - [address]  [address] [place] [address]  Phone:  [phone]  Fax:  [phone]

## 2021-09-22 NOTE — Telephone Encounter (Signed)
Called patient again to let her know that she will most likely need a PA and cost will not be known until PA approved. Pulled one bottle of samples and she will come by today to pick up.

## 2021-09-25 DIAGNOSIS — F332 Major depressive disorder, recurrent severe without psychotic features: Secondary | ICD-10-CM | POA: Diagnosis not present

## 2021-09-25 NOTE — Telephone Encounter (Signed)
WE can try 12.5mg  weekly until the 15mg  weeky comes back.  . Katina Degree, MD 09/25/2021 3:52 PM

## 2021-09-25 NOTE — Telephone Encounter (Signed)
Please advise 

## 2021-09-26 ENCOUNTER — Other Ambulatory Visit: Payer: Self-pay | Admitting: *Deleted

## 2021-09-26 MED ORDER — TIRZEPATIDE 12.5 MG/0.5ML ~~LOC~~ SOAJ: 12.5000 mg | SUBCUTANEOUS | 0 refills | Status: DC

## 2021-09-26 NOTE — Telephone Encounter (Signed)
LVM Mounjaro 12.5mg  was send to [facility] until the 15mg  comes back

## 2021-09-28 DIAGNOSIS — F332 Major depressive disorder, recurrent severe without psychotic features: Secondary | ICD-10-CM | POA: Diagnosis not present

## 2021-10-02 DIAGNOSIS — F332 Major depressive disorder, recurrent severe without psychotic features: Secondary | ICD-10-CM | POA: Diagnosis not present

## 2021-10-03 ENCOUNTER — Ambulatory Visit: Payer: BC Managed Care – PPO | Admitting: Family Medicine

## 2021-10-03 ENCOUNTER — Encounter: Payer: Self-pay | Admitting: Family Medicine

## 2021-10-03 VITALS — BP 118/76 | HR 80 | Temp 97.5°F | Ht 68.0 in | Wt 222.0 lb

## 2021-10-03 DIAGNOSIS — R21 Rash and other nonspecific skin eruption: Secondary | ICD-10-CM | POA: Diagnosis not present

## 2021-10-03 DIAGNOSIS — F332 Major depressive disorder, recurrent severe without psychotic features: Secondary | ICD-10-CM | POA: Diagnosis not present

## 2021-10-03 MED ORDER — KETOCONAZOLE 2 % EX CREA: 1.0000 | TOPICAL_CREAM | Freq: Two times a day (BID) | CUTANEOUS | 0 refills | Status: DC

## 2021-10-03 NOTE — Patient Instructions (Signed)
Ketoconazole cream-especially in armpits.   Call and see Dermatologist ASAP.  May be from medications.

## 2021-10-03 NOTE — Progress Notes (Signed)
Subjective:     Patient ID: Deborah Soto, female    DOB: [birth year], 56 y.o.   MRN: [id number]  Chief Complaint  Patient presents with   Rash    Rash on upper thighs, shoulders, under breast, under arms, red patches, OTC terbinafine hydrochloride 1% CREAM, not helping Started last week, seem to be spreading     HPI Rash upper thighs, shoulders, under breasts/arms.  Terbinafine not helping.  Getting worse. DM-sugars are well controlled.  Added Avalidine.  Doing ketamine tx for depression.   Recently used old deodorant from "travel bag".   Strted with little spot on R upper thigh.  1 spot getting better under L breast, but rest still bright red.  No bug bites.   No f/c/sob.   Slight itchy.   Does have h/o +ANA.   Does have a derm  Health Maintenance Due  Topic Date Due   PAP SMEAR-Modifier  Never done   COLONOSCOPY (Pts 45-16yrs Insurance coverage will need to be confirmed)  Never done   MAMMOGRAM  11/22/2017   URINE MICROALBUMIN  10/11/2018    Past Medical History:  Diagnosis Date   Anemia    Anxiety    Asthma    Depression    Diabetes mellitus type 2 in obese (HCC) 06/11/2016   Dyspnea    Embolism - blood clot January 1997 & January 2017   Also had LLE DVT in 1997   History of kidney stones    Hypertension    Preeclampsia 10/10/2011   1999    Sleep apnea     Past Surgical History:  Procedure Laterality Date   ANTERIOR CERVICAL DECOMPRESSION/DISCECTOMY FUSION 4 LEVELS N/A 08/17/2020   Procedure: ANTERIOR CERVICAL DECOMPRESSION FUSION CERVICAL THREE - CERVICAL FOUR, CERVICAL FOUR- CERVICAL FIVE, CERVICAL FIVE- CERVICAL SIX WITH INSTRUMENTATION AND ALLOGRAFT;  Surgeon: Estill Bamberg, MD;  Location: [facility] OR;  Service: Orthopedics;  Laterality: N/A;   KIDNEY STONE SURGERY      Outpatient Medications Prior to Visit  Medication Sig Dispense Refill   amitriptyline (ELAVIL) 50 MG tablet Take 1 tablet by mouth at bedtime.     ampicillin (PRINCIPEN) 500 MG capsule Take  500 mg by mouth daily.     celecoxib (CELEBREX) 200 MG capsule TAKE ONE CAPSULE TWICE DAILY FOR chronic pain 180 capsule 0   Continuous Blood Gluc Receiver (DEXCOM G6 RECEIVER) DEVI Use 4 times daily as needed to check blood sugar 1 each PRN   Continuous Blood Gluc Sensor (DEXCOM G6 SENSOR) MISC Apply topically 4 (four) times daily.     Continuous Blood Gluc Transmit (DEXCOM G6 TRANSMITTER) MISC USE AS NEEDED TO CHECK BLOOD SUGAR FOUR TIMES DAILY     cyanocobalamin (,VITAMIN B-12,) 1000 MCG/ML injection One Injection weekly for 4 weeks then one injection monthly for 3 month 7 mL 0   Dextromethorphan-buPROPion ER (AUVELITY) 45-105 MG TBCR Take 1 tablet by mouth 2 (two) times daily. 60 tablet 1   Esketamine HCl, 84 MG Dose, (SPRAVATO, 84 MG DOSE,) 28 MG/DEVICE SOPK Place 84 mg into the nose See admin instructions. Use twice weekly     gabapentin (NEURONTIN) 300 MG capsule Take 2 capsules in the morning, 1 in the middle of the day, and 2 at night. 150 capsule 3   Insulin Pen Needle (PEN NEEDLES) 33G X 4 MM MISC Use daily as needed to inject insulin 100 each 1   lithium carbonate (ESKALITH) 450 MG CR tablet TAKE TWO TABLETS BY MOUTH AT BEDTIME 180  tablet 0   MOUNJARO 15 MG/0.5ML Pen INJECT 0.5MLS (15MG ) SUBCUTANEOUSLY ONCE A WEEK 6 mL 0   Multiple Vitamin (MULTIVITAMIN WITH MINERALS) TABS tablet Take 1 tablet by mouth daily.     pantoprazole (PROTONIX) 40 MG tablet TAKE ONE TABLET BY MOUTH ONCE DAILY FOR STOMACH PROTECTION 30 tablet 5   propranolol ER (INDERAL LA) 160 MG SR capsule Take 1 capsule (160 mg total) by mouth daily. 30 capsule 1   SYNJARDY XR 12.07-998 MG TB24 TAKE TWO TABLETS BY MOUTH DAILY 60 tablet 2   tirzepatide (MOUNJARO) 12.5 MG/0.5ML Pen Inject 12.5 mg into the skin once a week. 6 mL 0   tretinoin (RETIN-A) 0.025 % cream      Vilazodone HCl (VIIBRYD) 40 MG TABS TAKE 1 & 1/2 TABLETS BY MOUTH DAILY 45 tablet 1   Vitamin D, Ergocalciferol, 50000 units CAPS Take 1 capsule by mouth once  a week.     XARELTO 20 MG TABS tablet Take 1 tablet (20 mg total) by mouth daily with supper. 30 tablet 5   Cholecalciferol (VITAMIN D) 50 MCG (2000 UT) tablet Take 2,000 Units by mouth daily.     OLANZapine (ZYPREXA) 5 MG tablet Take 1 tablet (5 mg total) by mouth at bedtime. 30 tablet 1   tretinoin (RETIN-A) 0.1 % cream Apply 1 application topically at bedtime.     No facility-administered medications prior to visit.    Allergies  Allergen Reactions   Tramadol Other (See Comments)    Tingling all over    Hydrocodone     Ineffective    ROS neg/noncontributory except as noted HPI/below      Objective:     BP 118/76   Pulse 80   Temp (!) 97.5 F (36.4 C) (Temporal)   Ht 5\' 8"  (1.727 m)   Wt 222 lb (100.7 kg)   LMP  (LMP Unknown) Comment: last period ---  1 year ago (?)  SpO2 95%   BMI 33.75 kg/m  Wt Readings from Last 3 Encounters:  10/03/21 222 lb (100.7 kg)  06/26/21 228 lb 12.8 oz (103.8 kg)  06/20/21 229 lb 2 oz (103.9 kg)    Physical Exam   Gen: WDWN NAD HEENT: NCAT, conjunctiva not injected, sclera nonicteric OP normal EXT:  no edema MSK: no gross abnormalities.  NEURO: A&O x3.  CN II-XII intact.  PSYCH: normal mood. Good eye contact  Skin:  diffuse, annual, wet looking patches w/darker red borders and lighter pink centers-R ear, neck, under breasts, on breasts, upper inner thighs, abd, back R thigh/lower leg.  Axilla-more wet, larger, worse.       Assessment & Plan:   Problem List Items Addressed This Visit       Musculoskeletal and Integument   Rash - Primary   Rash-doubt fungus as so diffuse.  ?psoriasis(more wet), ?drug rxn, ?discoid lupus, other.  Will do ketoconazole cream.  Pt to call and see derm asap.  I don't want to add steroids yet and muddy picture.    No orders of the defined types were placed in this encounter.   06/28/21, MD

## 2021-10-05 DIAGNOSIS — L304 Erythema intertrigo: Secondary | ICD-10-CM | POA: Diagnosis not present

## 2021-10-05 DIAGNOSIS — B359 Dermatophytosis, unspecified: Secondary | ICD-10-CM | POA: Diagnosis not present

## 2021-10-05 DIAGNOSIS — F332 Major depressive disorder, recurrent severe without psychotic features: Secondary | ICD-10-CM | POA: Diagnosis not present

## 2021-10-09 DIAGNOSIS — F332 Major depressive disorder, recurrent severe without psychotic features: Secondary | ICD-10-CM | POA: Diagnosis not present

## 2021-10-11 ENCOUNTER — Telehealth: Payer: Self-pay | Admitting: Family Medicine

## 2021-10-11 NOTE — Telephone Encounter (Signed)
Ok to placed referral  

## 2021-10-11 NOTE — Telephone Encounter (Signed)
Patient states her ingrown toenail hurts really bad and that she would like to see a Podiatrist.  Patient requests a Referral be placed if necessary.

## 2021-10-12 DIAGNOSIS — F332 Major depressive disorder, recurrent severe without psychotic features: Secondary | ICD-10-CM | POA: Diagnosis not present

## 2021-10-12 NOTE — Telephone Encounter (Signed)
Patietn schedule OV with PCP on 10/13/2021

## 2021-10-12 NOTE — Telephone Encounter (Signed)
Left message to return call to our office at their convenience.  

## 2021-10-12 NOTE — Telephone Encounter (Signed)
Ok to place referral or she can schedule an appointment with [name] to see if there is anything we can do before we send her to podiatry.  Katina Degree. [name], MD 10/12/2021 8:30 AM

## 2021-10-13 ENCOUNTER — Ambulatory Visit: Payer: BC Managed Care – PPO | Admitting: Family Medicine

## 2021-10-13 ENCOUNTER — Encounter: Payer: Self-pay | Admitting: Family Medicine

## 2021-10-13 VITALS — BP 104/70 | HR 74 | Ht 68.0 in | Wt 217.0 lb

## 2021-10-13 DIAGNOSIS — E1169 Type 2 diabetes mellitus with other specified complication: Secondary | ICD-10-CM

## 2021-10-13 DIAGNOSIS — Z6832 Body mass index (BMI) 32.0-32.9, adult: Secondary | ICD-10-CM

## 2021-10-13 DIAGNOSIS — L600 Ingrowing nail: Secondary | ICD-10-CM

## 2021-10-13 DIAGNOSIS — E669 Obesity, unspecified: Secondary | ICD-10-CM

## 2021-10-13 LAB — POCT GLYCOSYLATED HEMOGLOBIN (HGB A1C): Hemoglobin A1C: 6 % — AB (ref 4.0–5.6)

## 2021-10-13 MED ORDER — DEXCOM G7 RECEIVER DEVI: 11 refills | Status: AC

## 2021-10-13 MED ORDER — DEXCOM G7 SENSOR MISC: 11 refills | Status: DC

## 2021-10-13 NOTE — Patient Instructions (Signed)
It was very nice to see you today!  We took off part of your ingrown toenail today.  Please keep this area clean until it heals up over the next few days.  Let us know if you have any signs of infection.  Your A1c looks great today.  We can continue your current treatment plan.  I will see  you back in 3 months.  Come back sooner if needed.  Take care, Dr [name]  PLEASE NOTE:  If you had any lab tests please let us know if you have not heard back within a few days. You may see your results on mychart before we have a chance to review them but we will give you a call once they are reviewed by [name]. If we ordered any referrals today, please let us know if you have not heard from their office within the next week.   Please try these tips to maintain a healthy lifestyle:  Eat at least 3 REAL meals and 1-2 snacks per day.  Aim for no more than 5 hours between eating.  If you eat breakfast, please do so within one hour of getting up.   Each meal should contain half fruits/vegetables, one quarter protein, and one quarter carbs (no bigger than a computer mouse)  Cut down on sweet beverages. This includes juice, soda, and sweet tea.   Drink at least 1 glass of water with each meal and aim for at least 8 glasses per day  Exercise at least 150 minutes every week.    Fingernail or Toenail Removal, Adult  Fingernail or toenail removal is a procedure to remove a person's nail. This may be done because of an injury, accident, or medical condition. It may be needed when the nail has not grown in the right way. The nail may also be ingrown, infected, or damaged. Tell a health care provider about: Any allergies you have. All medicines you are taking, including vitamins, herbs, eye drops, creams, and over-the-counter medicines. Any problems you or family members have had with anesthetic medicines. Any bleeding problems you have. Any surgeries you have had. Any medical conditions you have. This includes  diabetes. Whether you are pregnant or may be pregnant. What are the risks? Your health care provider will talk with you about risks. These may include: Bleeding. Infection. Allergic reactions to medicines. Damage to nearby structures. The nail growing back in the wrong way. What happens before the procedure? Medicines Ask your health care provider about: Changing or stopping your regular medicines. These include any diabetes medicines or blood thinners you take. Taking medicines such as aspirin and ibuprofen. These medicines can thin your blood. Do not take them unless your health care provider tells you to. Taking over-the-counter medicines, vitamins, herbs, and supplements. Surgery safety Ask your health care provider: How your surgery site will be marked. What steps will be taken to help prevent infection. These steps may include: Washing skin with a soap that kills germs. Receiving or applying antibiotics. What happens during the procedure? You may be given: Local anesthesia. This will numb certain areas of your body. A tool will be inserted under the nail to lift it up. An incision may be made in your nail. The nail will be removed. A bandage (dressing) will be put over the area where the nail was. The procedure may vary among health care providers and clinics. What happens after the procedure? If you had a fingernail removed, you may be given a finger splint  to wear while you recover. If you had a toenail removed, you may be given a surgical shoe to wear while you recover. You may need to keep your hand or foot raised (elevated) or supported on a pillow for 24 hours or as long as told by your health care provider. Summary You may need to have a nail removed if the nail has not grown in the right way or because of injury, accident, or medical condition. It may also be ingrown, infected, or damaged. Tell your health care provider about all medicines you take and any medical  conditions you have. You will be given medicine to numb the area. The nail will be removed. You may be given a finger splint or surgical shoe to wear while you recover. This information is not intended to replace advice given to you by your health care provider. Make sure you discuss any questions you have with your health care provider. Document Revised: 06/13/2021 Document Reviewed: 06/13/2021 Elsevier Patient Education  2023 [employer].

## 2021-10-13 NOTE — Assessment & Plan Note (Signed)
A1c great at 6.0.  We will continue her Mounjaro 15 units weekly and Synjardy 12.07-998 twice daily.  Of note she has been on Mounjaro 12.5 units weekly for the last few weeks due to shortages that we will go back to her 15 units weekly once that is back to start.  We will recheck her A1c in 3 to 6 months

## 2021-10-13 NOTE — Progress Notes (Signed)
   Deborah Soto is a 56 y.o. female who presents today for an office visit.  Assessment/Plan:  New/Acute Problems: Ingrown toenail Removal performed today.  She tolerated well.  We discussed care after and reasons to return to care.  Chronic Problems Addressed Today: Diabetes mellitus type 2 in obese (HCC) A1c great at 6.0.  We will continue her Mounjaro 15 units weekly and Synjardy 12.07-998 twice daily.  Of note she has been on Mounjaro 12.5 units weekly for the last few weeks due to shortages that we will go back to her 15 units weekly once that is back to start.  We will recheck her A1c in 3 to 6 months  Morbid obesity (HCC) Patient continues to lose weight.  She is down about 11 pounds since her last visit.  Congratulated patient on weight loss.  She will continue lifestyle modifications.  We can recheck in 3 to 6 months.     Subjective:  HPI:  See A/P for status of chronic conditions.  She is also here with right great toenail pain.  She has a normal toenail.  She would like to have it removed possible.       Objective:  Physical Exam: BP 104/70   Pulse 74   Ht 5\' 8"  (1.727 m)   Wt 217 lb (98.4 kg)   LMP  (LMP Unknown) Comment: last period ---  1 year ago (?)  SpO2 96%   BMI 32.99 kg/m   Wt Readings from Last 3 Encounters:  10/13/21 217 lb (98.4 kg)  10/03/21 222 lb (100.7 kg)  06/26/21 228 lb 12.8 oz (103.8 kg)    Gen: No acute distress, resting comfortably MSK: Ingrown toenail along medial edge of right first digit. Neuro: Grossly normal, moves all extremities Psych: Normal affect and thought content  Toenail Avulsion Procedure Note  Pre-operative Diagnosis: Right Ingrown Great toenail   Post-operative Diagnosis: Right Ingrown Great toenail  Indications: Therapeutic  Anesthesia: Lidocaine 2% without epinephrine without added sodium bicarbonate  Procedure Details  History of allergy to iodine: no  The risks (including bleeding and infection) and  benefits of the  procedure and Verbal informed consent obtained.  After digital block anesthesia was obtained, a tourniquet was applied for hemostasis during the procedure.  After prepping with Betadine, the offending edge of the nail was freed from the nailbed and perionychium, and then split with scissors and removed with  forceps.  All visible granulation tissue is debrided. Antibiotic and bulky dressing was applied.   Findings: Ingrown toenail  Complications: none.  Plan: 1. Soak the foot twice daily. Change dressing twice daily until healed over. 2. Warning signs of infection were reviewed.   3. Recommended that the patient use OTC analgesics as needed for pain.       06/28/21. Katina Degree, MD 10/13/2021 12:17 PM

## 2021-10-13 NOTE — Assessment & Plan Note (Signed)
Patient continues to lose weight.  She is down about 11 pounds since her last visit.  Congratulated patient on weight loss.  She will continue lifestyle modifications.  We can recheck in 3 to 6 months.

## 2021-10-16 DIAGNOSIS — F332 Major depressive disorder, recurrent severe without psychotic features: Secondary | ICD-10-CM | POA: Diagnosis not present

## 2021-10-17 DIAGNOSIS — F332 Major depressive disorder, recurrent severe without psychotic features: Secondary | ICD-10-CM | POA: Diagnosis not present

## 2021-10-19 DIAGNOSIS — F332 Major depressive disorder, recurrent severe without psychotic features: Secondary | ICD-10-CM | POA: Diagnosis not present

## 2021-10-22 ENCOUNTER — Other Ambulatory Visit: Payer: Self-pay | Admitting: Psychiatry

## 2021-10-22 DIAGNOSIS — F411 Generalized anxiety disorder: Secondary | ICD-10-CM

## 2021-10-22 DIAGNOSIS — F332 Major depressive disorder, recurrent severe without psychotic features: Secondary | ICD-10-CM

## 2021-10-23 DIAGNOSIS — F332 Major depressive disorder, recurrent severe without psychotic features: Secondary | ICD-10-CM | POA: Diagnosis not present

## 2021-10-23 NOTE — Telephone Encounter (Signed)
Called patient. She is still taking.

## 2021-10-24 ENCOUNTER — Ambulatory Visit: Payer: BC Managed Care – PPO | Admitting: Family Medicine

## 2021-10-24 DIAGNOSIS — B359 Dermatophytosis, unspecified: Secondary | ICD-10-CM | POA: Diagnosis not present

## 2021-10-24 DIAGNOSIS — F332 Major depressive disorder, recurrent severe without psychotic features: Secondary | ICD-10-CM | POA: Diagnosis not present

## 2021-10-26 ENCOUNTER — Ambulatory Visit: Payer: BC Managed Care – PPO | Admitting: Family Medicine

## 2021-10-30 ENCOUNTER — Telehealth: Payer: Self-pay | Admitting: Psychiatry

## 2021-10-30 DIAGNOSIS — F332 Major depressive disorder, recurrent severe without psychotic features: Secondary | ICD-10-CM | POA: Diagnosis not present

## 2021-10-30 NOTE — Telephone Encounter (Signed)
Pt called reporting about week and half- two weeks ago she started shaking has turned in to more of a twitch. Reports she is taking Lithium and started Auvelity couple months ago. She also takes propranolol. Contact pt asap @ [phone]

## 2021-10-30 NOTE — Telephone Encounter (Signed)
Per June note tremor is possibly from lithium and it was recommended that patient take medication 4-5 hours before bedtime in an effort to minimize daytime tremor. Patient said she was taking 2-3 hours before bedtime. She will try taking as recommended and let us know at the end of the week if no improvement.

## 2021-10-31 DIAGNOSIS — F332 Major depressive disorder, recurrent severe without psychotic features: Secondary | ICD-10-CM | POA: Diagnosis not present

## 2021-11-02 DIAGNOSIS — F332 Major depressive disorder, recurrent severe without psychotic features: Secondary | ICD-10-CM | POA: Diagnosis not present

## 2021-11-06 DIAGNOSIS — F332 Major depressive disorder, recurrent severe without psychotic features: Secondary | ICD-10-CM | POA: Diagnosis not present

## 2021-11-07 DIAGNOSIS — F332 Major depressive disorder, recurrent severe without psychotic features: Secondary | ICD-10-CM | POA: Diagnosis not present

## 2021-11-09 DIAGNOSIS — F332 Major depressive disorder, recurrent severe without psychotic features: Secondary | ICD-10-CM | POA: Diagnosis not present

## 2021-11-14 ENCOUNTER — Telehealth: Payer: Self-pay | Admitting: Psychiatry

## 2021-11-14 DIAGNOSIS — F332 Major depressive disorder, recurrent severe without psychotic features: Secondary | ICD-10-CM | POA: Diagnosis not present

## 2021-11-14 NOTE — Telephone Encounter (Signed)
On 8/21 you recommended pt take medication 4-5 hours before bedtime in an effort to minimize daytime tremor.She is still having tremors

## 2021-11-14 NOTE — Telephone Encounter (Signed)
Pt LVM on 9/4 @ 1:44p.  She said she is still having tremors and twitching even after taking the lithium earlier.  She's been doing it for about 2 wks that way and still having issues.  She wants to know what to do next.  Next appt 9/18

## 2021-11-15 ENCOUNTER — Other Ambulatory Visit: Payer: Self-pay | Admitting: Psychiatry

## 2021-11-15 NOTE — Telephone Encounter (Signed)
My understanding is she is still taking 2 lithium CR 450 mg tablets daily.  If so she can reduce it to 1-1/2 tablets daily to see if tremor will be get better.  It was a on the bottle that she cannot cut the tablets but if she uses a pill splitter she can cut the tablets and it is safe to do so.

## 2021-11-16 DIAGNOSIS — F332 Major depressive disorder, recurrent severe without psychotic features: Secondary | ICD-10-CM | POA: Diagnosis not present

## 2021-11-16 NOTE — Telephone Encounter (Signed)
Pt informed

## 2021-11-17 ENCOUNTER — Other Ambulatory Visit: Payer: Self-pay | Admitting: Family Medicine

## 2021-11-17 DIAGNOSIS — M797 Fibromyalgia: Secondary | ICD-10-CM

## 2021-11-20 DIAGNOSIS — F332 Major depressive disorder, recurrent severe without psychotic features: Secondary | ICD-10-CM | POA: Diagnosis not present

## 2021-11-21 ENCOUNTER — Other Ambulatory Visit: Payer: Self-pay | Admitting: Psychiatry

## 2021-11-21 DIAGNOSIS — F332 Major depressive disorder, recurrent severe without psychotic features: Secondary | ICD-10-CM | POA: Diagnosis not present

## 2021-11-21 DIAGNOSIS — F411 Generalized anxiety disorder: Secondary | ICD-10-CM

## 2021-11-23 ENCOUNTER — Other Ambulatory Visit: Payer: Self-pay | Admitting: Psychiatry

## 2021-11-23 DIAGNOSIS — F411 Generalized anxiety disorder: Secondary | ICD-10-CM

## 2021-11-23 DIAGNOSIS — F332 Major depressive disorder, recurrent severe without psychotic features: Secondary | ICD-10-CM

## 2021-11-24 NOTE — Telephone Encounter (Signed)
Has appt with Dr. Jennelle Human on 9/18.

## 2021-11-27 ENCOUNTER — Encounter: Payer: Self-pay | Admitting: Psychiatry

## 2021-11-27 ENCOUNTER — Ambulatory Visit: Payer: BC Managed Care – PPO | Admitting: Psychiatry

## 2021-11-27 DIAGNOSIS — F411 Generalized anxiety disorder: Secondary | ICD-10-CM | POA: Diagnosis not present

## 2021-11-27 DIAGNOSIS — G4733 Obstructive sleep apnea (adult) (pediatric): Secondary | ICD-10-CM

## 2021-11-27 DIAGNOSIS — F339 Major depressive disorder, recurrent, unspecified: Secondary | ICD-10-CM

## 2021-11-27 DIAGNOSIS — Z79899 Other long term (current) drug therapy: Secondary | ICD-10-CM

## 2021-11-27 DIAGNOSIS — R52 Pain, unspecified: Secondary | ICD-10-CM

## 2021-11-27 DIAGNOSIS — G4710 Hypersomnia, unspecified: Secondary | ICD-10-CM

## 2021-11-27 DIAGNOSIS — G251 Drug-induced tremor: Secondary | ICD-10-CM

## 2021-11-27 DIAGNOSIS — R7989 Other specified abnormal findings of blood chemistry: Secondary | ICD-10-CM

## 2021-11-27 MED ORDER — AUVELITY 45-105 MG PO TBCR
1.0000 | EXTENDED_RELEASE_TABLET | Freq: Two times a day (BID) | ORAL | 0 refills | Status: DC
Start: 2021-11-27 — End: 2022-03-10

## 2021-11-27 NOTE — Telephone Encounter (Signed)
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

## 2021-11-27 NOTE — Progress Notes (Signed)
Deborah Soto [id number] 1966-02-15 56 y.o.     Subjective:   Patient ID:  Deborah Soto is a 56 y.o. (DOB 05/26/1965) female.  Chief Complaint:  Chief Complaint  Patient presents with   Follow-up   Depression   Depression        Associated symptoms include fatigue and myalgias.  Associated symptoms include no decreased concentration.   Deborah Soto presents to the office today for follow-up of TRD and anxiety, and insomnia.  seen December 08, 2018.  She was complaining of lithium tremor and some cognitive problems.  Hydroxyzine was stopped.  Propranolol increased to 60 mg twice daily for tremor.  Lithium level and amitriptyline levels were requested. Amitriptyline level to 37, nortriptyline 49 on amitriptyline 250 mg nightly. Lithium level 0.800 mg daily  Phone call on October 6 to discuss lab results as follows: Note   ----- Message from [name]., Soto sent at 12/15/2018 10:08 AM EDT ----- Lithium level stable at 0.8 in a good range.  The previous was 0.7.  She is having some tremor issues.   Normal BMP including excellent creatinine and calcium levels.  Blood sugar is high but she is aware of that problem.   TSH is within normal limits.   Serum amitriptyline level is 286 which is at the upper end of the normal range and suggestive that we not try further increase.  We will discuss further options at her follow-up appointment.   No medication adjustments required, but because of her tremor and her lithium level is slightly higher than it was with the prior level if she wants to reduce the lithium from 3 of the 300 mg tablets daily to 2-1/2 of the 300 mg tablets daily she can do so.  At her last appointment she was also encouraged to try a higher dose of propranolol and that may have resolved the problem.  If it did do not reduce the lithium but if it did not and she wants to reduce the dose she can do so as noted.  She should call us if she has any recurrence of  symptoms.   Deborah Soto, [name]     She had repeat lithium level at 750 mg daily of 0.  7 with some improvement in tremor.  In phone call on November 20 she reported mood was worse with weepiness, overreacting, "pity party".  Because mood symptoms were worse with the reduction lithium she was encouraged to increase the dosage back to 900 mg daily.   Last seen February 04, 2019.  The following changes were made: Increase propranolol to 80 mg twice daily for lithium tremor and anxiety her blood pressure and pulse appear high enough that she can tolerate this dosage.  Disc Se.  Disc risk with low BP and DM.  He has not been having any problems with low blood sugar. Amitriptyline 6 or 300 mg daily if tolerated.  Try to take some about 4 -5 hours before sleep Try to take lithium also 4-5 hours before sleep to minimize daytime tremor Add methylphenidate ER 27 mg in the morning.  If no response we will increase the dosage.  Seen May 08, 2019 with her husband. Aside from physically feeling like she can't do much then having memory issues.  Forgetful.  Loses track of thoughts between tasks.  Most embarrassing being around people and word-finding issues.   Problems with walking bc balance and weakness.  Fears falling. Only fall at Xmas morning.  Tremor is awful.  Affects keying.   H notices cognitive problems and forgetfulness.    Shaking got really bad and got lithium level as low as 600 mg but the next day felt really helpless and everything seemed to be aimed at hurting me and felt disrespected.  Problems with boss and 56 yo taking 5 AP classes and 2 volleyball classes.  I've been on her to accomplish all these things.  She claimed that pt screaming at her.   H CO she was more irritable after the reduction in lithium.   H CO her anxiety also seems worse.  H says the last year has been unhealthy with 12 hour work days for her.   Doesn't go out much.    Attention problems more  noticeable.  Still dropping and tremor.  Tolerated the increase in propranolol.    CO shakiness from lithium which interferes with typing. Also a good bit of anxiety generally without panic.  BS are high.  NotTaking hydroxyzine.   sleeping better and still using CPAP.  Using CPAP regularly. Less awakening than before.  No SE. depression was worse after reducing the lithium.  Irritability was also worse after reducing the lithium to 600 mg daily.  seen May 08, 2019.  Multiple changes made:  Reduce propranolol to 60 mg twice daily for lithium tremor and anxiety since the increase was not helpful.   Restart B6 for lithium tremor. 500 mg BID. She forgot and doesn't want more. Try to take lithium also 4-5 hours before sleep to minimize daytime tremor. depression was worse after reducing the lithium.  Irritability was also worse after reducing the lithium to 600 mg daily. So reluctant to reduce it further.  It is unclear how to address the benefits of lithium without using lithium other than to consider Spravato  Increase methylphenidate ER 36 mg in the morning. Increase methylphenidate ER 36 mg in the morning.  Wait 1-2 weeks then reduce amitriptyline to 5 tablets nightly to see if cognition is better. Also saw Dr. [name] re: sleep disorder.  05/2019 appt with the following noted: Lost Rx of Concerta 36 so didn't take it long.  Out of it.  Still shakey and cognitive problems.  Dep 5/10/  Anxiety 6/10 worse at night.  No SI.  8 hours sleep.  Mild panic couple times daily. Frustrated with tremor and sleepiness which is worse than tiredness.   History of low vitamin D taking 150K/week but stopped 6 mos ago. Plan : Modafinil 100 mg tablet 1 dailly for 1 week, Then add Concerta 36 mg 1 each AM.  Option increase modafinil mid April  Restart B6 for lithium tremor. 500 mg BID. Get lab test at earliest convience  10/14/2019 appointment with the following noted: Has been on amitriptyline 250 mg for  mos.  Tried a week ago reducing to 200 and felt worse so back on 250. Weaned off Lyrica about a month ago.  Not much change off it.   Not sure if it helped energy or alertness. Saw Dr. Cardell Peach in consultation. Mental clarity is a lot better with stimulants and not sleeping as much in daytime with current meds.  Severe drowsiness is a lot better.   Off metformin and added Glipizide.   Not great with depression 5/10.  Anxiety can trigger problems with thinking and somatic sx   I feel like it's something physical and can keep her up at night. Occ racing heart or feeling hot.  Caffeine variable.  Plan:Plan: Increase methylphenidate from 36 to 54 mg in the morning. Increase amitriptyline to 6 tablets in the evening    11/12/19 TC noting call: Pt called stated she's not feeling well at all. Ask for sooner apt than 10/7. Depression, anxiety & focus is much worse. Has not planned to hurt herself but thoughts of things would be better if I was not here have crossed her mind often. Contact ASAP @ [phone].  I put Pt on canc list  Soto response: I reviewed the meds and previous med lists.  The next options are either Spravato or a major med change from amitriptyline which is not something I can do outside of an appt.  She's on cancellation list and we'll try to work her in sooner.  Nothing I can change right now except since the increase in amitriptyline has not helped, she can reduce it to 200 mg nightly in preparation for other changes to come. Nursing response after talking with pt: Discussed symptoms and medications with patient, she did not increase her Amitriptyline 50 mg up to 6 tablets so she will start that today. Said you may have told her that but she didn't remember. She has read about Spravato but she is not wanting to proceed with that. Informed her she's on cancellation list as well. She will call back with worsening signs or symptoms.  11/23/19 appt with the following noted: Forgot to increase  amitriptyline until noted. Tolerating it OK but hasn't had time to help. More alert with Concerta increase to 54 mg daily also with modafinil.  A little more focused and better productivity.  Still forgetful.   No SE noted except GI issues from diarrhea to constipation.   Still miserable and cry easily.  Try to numb herself.  Always tired especially emotionally.  Anxiety is high and predates current stimulants. No therapy since [facility].  She felt he pushed too hard for her to make changes.  I don't feel like I can deal with him right now. Plan: Continue meds & The increase in amitriptyline to 300 mg daily needs more time.  01/12/2020 appointment with the following noted: Not well.  Mood horrible.  Easily frustrated sometimes without reason and may nap or cry.  Anxiety is pretty bad.  Daytime sleepiness seems worse.  Can get nausea from anxiety.  Feels like function is slow.  Have to write everything down.  Hates home, job, high school volleyball program.  At some point I have to take responsibility.   Amitriptyline also not helping pain any.  Clenching jaw for 3 weeks. Likcking lips.  Picking fingers. D will play volleyball at [facility] and [place]. Plan: Reduce amitriptyline by 1 tablet every 4 days. Start Viibryd 10 mg daily with food for 7 days, then 20 mg daily for 7 days, then increase to 40 mg daily.  03/18/20 appt urgently made and noted:  Seen with husband. Still with jaw clenching day and night despite off stimulant. Now on Viibryd 60 mg since 03/02/20 and no stimulant., lithium 900 mg, and propranolol 80 BID. Rare hydroxyzine DT sleepiness, Gabapentin 300 TID. Not taking it with food.  LT sensitivity to noise and light and overstimulated and now bothering her more at work.  Always had to use shirts without tags and sensitivity to smells. Easily agitated in public per H.  Gets negative and wants to give up and not live anymore.  She says there's less chance of acting on it if she tells  H about  it and she's doing so now.  Pain worse off amitriptyline esp around ribs and general diffuse pain.  Anxiety worse with stimulants and hasn't gone away.  Trouble staying asleep. Plan: Need to take Viibryd 60 with food emphasized.  She's not currently doing so. Restart low-dose amitriptyline for pain  04/22/2020 appointment with following noted: Only restarted amitriptyline 50 mg last week.  Stays asleep better with it.  Sleep 8-9 hours now but no change in pain yet.  Sleepier in day recently. Done a lot of travel lately. Maybe viibryd making a little difference with mood and anxiety now that has been on it longer.  Needs to take Viibryd in AM bc seems to interfere with sleep. Final season of travel volleyball with good team. H notes still some irritability. Pain worse off amitriptyline.  Can reduce to  amitriptyline 25 for pain if 50 mg remains too sedating.  Is sleeping better.      06/21/2020 appointment with following noted:  Seen with H Tolerating amitriptyline 50 mg HS with decent sleep. Really crappy and as bad as 10 years.  Situationally life sucks.  Work environment is bad with coworkers.  Since Sept work is bad.  Doesn't feel people are supportive at home or work. H working 12 hour days.  When she's off wants to sleep all day.   Still in therapy with Gerald Stabs.  Is good therapist. 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  Check lithium level Referred to Dr. Toy Care for [facility]  11/03/2020 appointment with the following noted: 56 yo D got Covid but is OK. Still doing Spravato 84 mg twice weekly and plans to continue for awhile. And for a total of 3 mos.  Tolerated well.  It's a nice feeling.   Didn't seem to help the depression for a couple of months.   It does feel different. SI has resolved lately which is particularly impressive given she  has a lot of free time. She has lot s of time with nothing to do bc quit her job. Job environment got worse and worse.  Had been there 9 years.   Doing PT for pain in her back.  Ongoing physical issues.   Mood clearly better with Spravato but not resolved.   Neck fusion recently. Plan: check lithium level Reduce olanzapine to 5 mg HS  12/30/2020 appointment with the following noted: Surges of anxious feelings in her chest without heart racing several times daily last 15 mins and without pcpt.  Stay a little on edge all the time.   Left job and too much time on her hands. 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  She's doing well statistically.  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 Spravato twice weekly.  Is better with it but still some mild cycling of depression.  Still upset over leaving work.  SI very rare and fleeting.  Had a really good plan but doesn't dwell on the plan. Lithium level 0.7 on 900 mg HS. Reduced olanzapine to 5 mg HS and has felt a little worse. Viibryd 60 mg daily. Amitrptyline 50 for pain.  Not sure if it helps.  PT helped a lot with tension in neck and down spine. Sleeping pretty well after 8 hours. Plan increase olanzapine back to 10 mg nightly for treatment resistant depression and anxiety.  Anxiety was worse after reducing it  and no symptoms were improved after reducing it. Try to stop amitriptyline because it is probably not needed in the presence of olanzapine. Check lithium level  03/14/2021 appointment noted: Increase olanzapine to 10 mg for anxiety spells that are unexpected and hit her in the chest esp in the morning. Very low right now.  Twice weekly Spravato and felt good for awhile.  Maybe a slump after Xmas.  Not much social interaction since she quit her job.  Xmas gave her focus.  Doesn't have this now.   Mark working 14-16 hours daily.  This won't get better. Kids are doing great but not around Sleep well generally and that is a benefit of  olanzapine. Tolerates it well.   Plan: Trial increase olanzapine to 15 mg HS  05/03/2021 appointment with the following noted: Still getting Spravato twice weekly.   I love it.  Totally relaxed Depression and anxiety SI is gone or fleeting with Spravato. Still in therapy with Hilma Favors and thinks a lot of stuff go back to childhood.  Was always criticized for normal childhood mistakes.  So coped by overcompensating with going big and grand. Can't get energy up to do anything. Never felt she had a manic periods except overstimulated CC anxious feeling in body tingling and tight uncomfortable feeling.  Reminds her of stimulant effects but less intense and worse in AM. Everything still seems to hard like going to grocery store and worry over messing up cooking. Watches too much TV For a little while increase olanzapine seemed to help.  Tolerating it. Depression is not that bad compared to the past. Plan: no med changes  07/17/21 appt noted:  seen with H Depression with twice weekly Spravato. But a lot of general anxiety and then spells of getting tingly and nervous.  Not triggers.   Using propranolol BID. Anxiety is continuation of the way it was before.  Doesn't seem to vary with time of day.  Awakens with anxiety. No SE noted except sleepy.  Sleeping at least 10-11 hours.  Not obviously worse with increase olanzapine.  Viibryd in AM bc insomnia otherwise. Balance trouble with hunched walk and she feels like legs are waker than they should be. Plan: Stop regular propranolol and start ER propranolol ER 1 at night for anxiety Reduce clonidine to 1/2 tablet twice daily Wait 1 week and reduce olanzapine to 1/2 tablet nightly to see if energy, alertness and balance are better. Starting June 1 stop clonidine.  09/05/21 appt noted: Still low energy.  No changes with less meds. Anxiety a little better last week for unclear reasons. She feels olanzapine helps sleep and doesn't want to stop it.   Sleep a lot.  11-11 if she can. Therapist working with her on childhood trauma. Long period of depression. Plan: Start Auvelity 1 daily for a week then 1 twice daily  11/27/21 appt noted:  Couple of phone calls and tremor problems and needed to reduce lithium to 1 and 1/2 tablets and it helped reduce the tremor. Maybe a little improvement.  Not so depressed but kind of numb.   Spells of anxiety but a little better.  Can feel it in chest and arms at times every other night.   Continues spravato twice weekly.   Sleep is great. Off clonidne.  Contiues Aubelity twice daily, lithium 675 mg daily, olanzapine 5 mg HS, viibryd 60 mg daily. Sleep from 11-9 and sleeps well.    ECT-MADRS    [place] [facility] Visit from  05/03/2021 in [place] Total Score 32      PHQ2-9    [place] Visit from 10/13/2021 in [place] Visit from 10/03/2021 in [place] Visit from 06/26/2021 in [facility] [place] Visit from 03/28/2021 in [place] Visit from 09/22/2020 in [place]  PHQ-2 Total Score 4 6 6  0 6  PHQ-9 Total Score 11 19 19 11 19       Flowsheet Row Admission (Discharged) from 08/17/2020 in [facility] [facility] from 08/16/2020 in [facility] PREADMISSION TESTING  C-SSRS RISK CATEGORY No Risk No Risk        Maggie 74 and dating.  Past Psychiatric Medication Trials: Failed ECT and TMS,  Spravato amitriptyline helped for 2 to 3 years in 1987,   2021 No benefit from amitriptyline 300 mg with adequate duration.  Nortriptyline 200 with a therapeutic blood level. Trintellix, duloxetine 120, Lexapro 20, Fetzima,  Wellbutrin, fluoxetine, paroxetine, sertraline,  (Poor response to SSRIs) Viibryd 60 [place],   lithium SE,  Latuda 120, Abilify 15,  Vraylar,  Rexulti,Seroquel 400, risperidone, Geodon,  Haldol for agitation, lamotrigine 300,  buspirone, clonazepam, gabapentin, Propranolol, clonidine 0.1 mg BID modafinil, pramipexole,  Nuvigil, Vyvanse, Concerta SHE DOESN'T WANT STIMULANTS AGAIN bc of anxiety. Trazodone,   GENESIGHT COMPLETED   history of overdose on Xanax, There is a history of suicide attempts and psychiatric hospitalizations.  Review of Systems:  Review of Systems  Constitutional:  Positive for fatigue. unchanged Cardiovascular:  Negative for chest pain and palpitations.  Gastrointestinal:  Negative for diarrhea.  Musculoskeletal:  Positive for back pain and myalgias. Negative for gait problem.       Less pain lately.  Neurological:  Positive for tremors .  Negative for dizziness.  Psychiatric/Behavioral:  Positive for dysphoric mood. Negative for confusion, decreased concentration, hallucinations and self-injury. The patient is nervous/anxious.   No changes  Medications: I have reviewed the patient's current medications.  Current Outpatient Medications  Medication Sig Dispense Refill   celecoxib (CELEBREX) 200 MG capsule TAKE ONE CAPSULE TWICE DAILY FOR chronic pain 180 capsule 0   Continuous Blood Gluc Receiver (DEXCOM G6 RECEIVER) DEVI Use 4 times daily as needed to check blood sugar 1 each PRN   Continuous Blood Gluc Receiver (DEXCOM G7 RECEIVER) DEVI Use 4 times daily to check blood sugar 1 each 11   Continuous Blood Gluc Sensor (DEXCOM G6 SENSOR) MISC Apply topically 4 (four) times daily.     Continuous Blood Gluc Sensor (DEXCOM G7 SENSOR) MISC Use 4 times daily to check blood sugar. 1 each 11   Continuous Blood Gluc Transmit (DEXCOM G6 TRANSMITTER) MISC USE AS NEEDED TO CHECK BLOOD SUGAR FOUR TIMES DAILY     cyanocobalamin (,VITAMIN B-12,) 1000 MCG/ML injection One Injection weekly for 4 weeks then one injection monthly for 3 month 7 mL 0   Esketamine HCl, 84 MG Dose, (SPRAVATO, 84 MG DOSE,) 28 MG/DEVICE SOPK  Place 84 mg into the nose See admin instructions. Use twice weekly     gabapentin (NEURONTIN) 300 MG capsule Take 2 capsules in the morning, 1 in the middle of the day, and 2 at night. (Patient taking differently: Take 2 capsules in the morning and 2 at night.) 150 capsule 3   Insulin Pen Needle (PEN NEEDLES) 33G X 4 MM MISC Use daily as needed to inject insulin 100  each 1   lithium carbonate (ESKALITH) 450 MG CR tablet TAKE TWO TABLETS BY MOUTH AT BEDTIME (Patient taking differently: Taking 1.5 tabs) 180 tablet 0   MOUNJARO 15 MG/0.5ML Pen INJECT 0.5MLS (15MG ) SUBCUTANEOUSLY ONCE A WEEK 6 mL 0   OLANZapine (ZYPREXA) 5 MG tablet Take 1 tablet (5 mg total) by mouth at bedtime. 30 tablet 0   pantoprazole (PROTONIX) 40 MG tablet TAKE ONE TABLET BY MOUTH ONCE DAILY FOR STOMACH PROTECTION 30 tablet 5   propranolol ER (INDERAL LA) 160 MG SR capsule Take 1 capsule (160 mg total) by mouth daily. 30 capsule 1   SYNJARDY XR 12.07-998 MG TB24 TAKE TWO TABLETS BY MOUTH DAILY 60 tablet 2   tirzepatide (MOUNJARO) 12.5 MG/0.5ML Pen Inject 12.5 mg into the skin once a week. 6 mL 0   tretinoin (RETIN-A) 0.025 % cream      Vilazodone HCl (VIIBRYD) 40 MG TABS TAKE 1 & 1/2 TABLETS BY MOUTH DAILY 45 tablet 0   Vitamin D, Ergocalciferol, 50000 units CAPS Take 1 capsule by mouth once a week.     XARELTO 20 MG TABS tablet Take 1 tablet (20 mg total) by mouth daily with supper. 30 tablet 5   Dextromethorphan-buPROPion ER (AUVELITY) 45-105 MG TBCR Take 1 tablet by mouth 2 (two) times daily. 180 tablet 0   Multiple Vitamin (MULTIVITAMIN WITH MINERALS) TABS tablet Take 1 tablet by mouth daily. (Patient not taking: Reported on 11/27/2021)     No current facility-administered medications for this visit.    Medication Side Effects: Sedation  Allergies:  Allergies  Allergen Reactions   Tramadol Other (See Comments)    Tingling all over    Hydrocodone     Ineffective     Past Medical History:  Diagnosis Date   Anemia     Anxiety    Asthma    Depression    Diabetes mellitus type 2 in obese ([place]) 06/11/2016   Dyspnea    Embolism - blood clot January 1997 & January 2017   Also had LLE DVT in 1997   History of kidney stones    Hypertension    Preeclampsia 10/10/2011   1999    Sleep apnea     Family History  Problem Relation Age of Onset   Dementia Father    Heart disease Father    Clotting disorder Mother    Arthritis Mother    Clotting disorder Sister    Clotting disorder Maternal Grandmother    Clotting disorder Maternal Aunt    Rheumatologic disease Neg Hx    Hyperparathyroidism Neg Hx     Social History   Socioeconomic History   Marital status: Married    Spouse name: Not on file   Number of children: Y   Years of education: Not on file   Highest education level: Not on file  Occupational History   Occupation: admin assist    Comment: insurance verification  Tobacco Use   Smoking status: Former    Packs/day: 1.00    Years: 20.00    Total pack years: 20.00    Types: Cigarettes    Quit date: 03/12/2004    Years since quitting: 17.7   Smokeless tobacco: Never  Vaping Use   Vaping Use: Never used  Substance and Sexual Activity   Alcohol use: Yes    Alcohol/week: 1.0 standard drink of alcohol    Types: 1 Cans of beer per week    Comment: 1-2 times a week   Drug  use: No   Sexual activity: Yes    Partners: Male    Birth control/protection: None  Other Topics Concern   Not on file  Social History Narrative   Originally from [place]. Always lived in [place]. 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 No international travel recently. Previously has been to [place] in May 1996. No mold exposure recently. No bird exposure. Does have multiple pets.    Social Determinants of Health   Financial Resource Strain: Not on file  Food Insecurity: Not on file  Transportation Needs: Not on file  Physical Activity: Not on file  Stress: Not on file  Social Connections: Not on file  Intimate  Partner Violence: Not on file    Past Medical History, Surgical history, Social history, and Family history were reviewed and updated as appropriate.   Please see review of systems for further details on the patient's review from today.   Objective:   Physical Exam:  LMP  (LMP Unknown) Comment: last period ---  1 year ago (?)  Physical Exam Constitutional:      General: She is not in acute distress. Musculoskeletal:        General: No deformity.  Neurological:     Mental Status: She is alert and oriented to person, place, and time.     Cranial Nerves: No dysarthria.     Coordination: Coordination normal.  Psychiatric:        Attention and Perception: Attention and perception normal. She does not perceive auditory or visual hallucinations.        Mood and Affect: Mood is anxious and depressed. Affect is not labile, blunt, angry, tearful or inappropriate.    Not as severe as in the past. Not irritable.    Speech: Speech normal.        Behavior: Behavior normal. Behavior is cooperative.        Thought Content: Thought content normal. Thought content is not paranoid or delusional. Thought content does not include homicidal or suicidal ideation. Thought content does not include suicidal plan.        Cognition and Memory: Cognition and memory normal.        Judgment: Judgment normal.     Comments: Insight intact  Less depressed with Spravato but not gone   Lab Review:     Component Value Date/Time   NA 140 11/22/2020 1223   NA 138 08/21/2019 0000   NA 140 12/11/2016 1236   K 4.1 11/22/2020 1223   K 4.2 12/11/2016 1236   CL 104 11/22/2020 1223   CO2 26 11/22/2020 1223   CO2 24 12/11/2016 1236   GLUCOSE 151 (H) 11/22/2020 1223   GLUCOSE 109 12/11/2016 1236   BUN 8 11/22/2020 1223   BUN 12 08/21/2019 0000   BUN 15.1 12/11/2016 1236   CREATININE 0.66 11/22/2020 1223   CREATININE 0.68 02/22/2020 1649   CREATININE 0.8 12/11/2016 1236   CALCIUM 9.6 11/22/2020 1223   CALCIUM  10.0 12/11/2016 1236   PROT 6.9 11/22/2020 1223   PROT 7.5 12/11/2016 1236   ALBUMIN 4.3 11/22/2020 1223   ALBUMIN 4.4 12/11/2016 1236   AST 40 (H) 11/22/2020 1223   AST 38 (H) 12/11/2016 1236   ALT 40 (H) 11/22/2020 1223   ALT 61 (H) 12/11/2016 1236   ALKPHOS 65 11/22/2020 1223   ALKPHOS 71 12/11/2016 1236   BILITOT 0.7 11/22/2020 1223   BILITOT 0.62 12/11/2016 1236   GFRNONAA >60 08/16/2020 1157   GFRNONAA 105 12/11/2018 1121  GFRAA 122 12/11/2018 1121       Component Value Date/Time   WBC 7.4 11/22/2020 1223   RBC 4.88 11/22/2020 1223   HGB 13.1 11/22/2020 1223   HGB 14.5 12/11/2016 1236   HCT 41.4 11/22/2020 1223   HCT 43.9 12/11/2016 1236   PLT 262.0 11/22/2020 1223   PLT 289 12/11/2016 1236   PLT 295 07/24/2016 1848   MCV 85.0 11/22/2020 1223   MCV 93.2 12/11/2016 1236   MCH 29.9 08/16/2020 1157   MCHC 31.6 11/22/2020 1223   RDW 16.4 (H) 11/22/2020 1223   RDW 13.5 12/11/2016 1236   LYMPHSABS 2.2 11/22/2020 1223   LYMPHSABS 2.4 12/11/2016 1236   MONOABS 0.6 11/22/2020 1223   MONOABS 0.7 12/11/2016 1236   EOSABS 0.1 11/22/2020 1223   EOSABS 0.7 (H) 12/11/2016 1236   BASOSABS 0.0 11/22/2020 1223   BASOSABS 0.0 12/11/2016 1236    Lithium Lvl  Date Value Ref Range Status  11/22/2020 0.7 0.6 - 1.2 mmol/L Final   11/22/20  lithium 0.7 on 900   Serum nortriptyline level on 150 mg a day was 109  Amitriptyline  Level 12/11/2018 =237  On 250 mg daily,.  12/23/2020 vitamin D level 31.38, vitamin B12 519  No results found for: "PHENYTOIN", "PHENOBARB", "VALPROATE", "CBMZ"   .res Assessment: Plan:    Recurrent major depression resistant to treatment ([place]) - Plan: Dextromethorphan-buPROPion ER (AUVELITY) 45-105 MG TBCR  Generalized anxiety disorder  Hypersomnolence  Obstructive sleep apnea  Diffuse pain  Lithium-induced tremor  Low vitamin D level  Lithium use   Breelle has chronic severe major depression and generalized anxiety that is treatment  resistant and failed multiple medications ECT and TMS as noted above including SSRIs, try cyclic's, lithium, and atypical antipsychotics for depression.. She has had partial benefit from amitriptyline plus lithium.  However  tremor problems from the lithium.  Efforts to reduce the lithium have led to worsening psychiatric symptoms.  There has been a discussion about possible bipolar elements but she has no clear manic episodes unrelated to medication changes.  She has features of atypical depression.  So an MAO inhibitor is attractive from that perspective. Not sig depressed but some numbness still.  H agrees.  Discussed Spravato option in detail.  She wants to transition Spravato back to this office.  DT numb emotionally reduce olanzapine to 2.5 mg HS for TRD For anxiety and depression. If this doesn't work then trial reduction of lithium to 450 mg HS to see if numbness is better.  Statistically speaking the most potentially beneficial option would be Parnate or Nardil but they are difficult to use   Need to take Viibryd 60 with food emphasized.   check lithium level 0.7 on 900 mg.  Reuced to 675 DT tremor.  continue Auvelity 1 twice daily BC partial benefit Disc SE  Rec counseling.  She's back seeing Dorrene German.. Needs more purpose in her day. She needs a fulfilling job.   Encourage physical activity and weight loss.  She is not particularly motivated and feels that is hard to accomplish because of chronic pain.  Chronic pain complicates her treatment of depression.  Recent B12 deficiency dx and treatment just started. vitamin D bc level 31 on 2000U daily  Hold B6 for lithium tremor. 500 mg BID. If needed.  Tremor is better.  None currently.  Try to take lithium also 4-5 hours before sleep to minimize daytime tremor. depression was worse after reducing the lithium.  Irritability was also  worse after reducing the lithium to 600 mg daily.   Disc weith loss meds Monjarou recently  started for DM and goals.  Last checked lost 17#.  BS is OK.  Has continuous glucose monitor.  Take vitamin D regularly.  Pt instructions:   Follow-up 8 weeks  Deborah Soto, [name]  Future Appointments  Date Time Provider [place]  01/15/2022  9:20 AM Vivi Barrack, [facility]    No orders of the defined types were placed in this encounter.      -------------------------------

## 2021-11-28 DIAGNOSIS — F332 Major depressive disorder, recurrent severe without psychotic features: Secondary | ICD-10-CM | POA: Diagnosis not present

## 2021-11-28 MED ORDER — OLANZAPINE 2.5 MG PO TABS
2.5000 mg | ORAL_TABLET | Freq: Every day | ORAL | 2 refills | Status: DC
Start: 2021-11-28 — End: 2022-02-20

## 2021-11-28 NOTE — Telephone Encounter (Signed)
Patient asked to send in Rx for 2.5 mg olanzapine, as the 5 mg tablet is difficult to cut with shape and size.

## 2021-11-29 ENCOUNTER — Ambulatory Visit: Payer: BC Managed Care – PPO | Admitting: Family

## 2021-11-29 ENCOUNTER — Telehealth: Payer: Self-pay | Admitting: Family Medicine

## 2021-11-29 ENCOUNTER — Encounter: Payer: Self-pay | Admitting: Family

## 2021-11-29 VITALS — BP 119/82 | HR 93 | Temp 97.7°F | Ht 68.0 in | Wt 213.6 lb

## 2021-11-29 DIAGNOSIS — R059 Cough, unspecified: Secondary | ICD-10-CM

## 2021-11-29 DIAGNOSIS — J069 Acute upper respiratory infection, unspecified: Secondary | ICD-10-CM | POA: Diagnosis not present

## 2021-11-29 LAB — POCT INFLUENZA A/B
Influenza A, POC: NEGATIVE
Influenza B, POC: NEGATIVE

## 2021-11-29 LAB — POC COVID19 BINAXNOW: SARS Coronavirus 2 Ag: NEGATIVE

## 2021-11-29 MED ORDER — PREDNISONE 20 MG PO TABS: ORAL_TABLET | ORAL | 0 refills | Status: DC

## 2021-11-29 NOTE — Telephone Encounter (Signed)
Patient Name: Deborah Soto Gender: Female DOB: [birth year] Age: 56 Y 7 D Return Phone Number: [phone] (Primary) Address: City/ State/ Zip: [place]  [address] Client [facility] [place] [facility] [place] Client Site [place] [facility] [place] [facility] Provider Dimas Chyle- MD Contact Type Call Who Is Calling Patient / Member / Family / Caregiver Call Type Triage / Clinical Relationship To Patient Self Return Phone Number [phone] (Primary) Chief Complaint CHEST PAIN - pain, pressure, heaviness or tightness Reason for Call Symptomatic / Request for Health Information Initial Comment Answering service transferred caller. Caller states she had a fever last night with night sweats and today she has chest tightness. She feels like she may have bronchitis. Translation No Nurse Assessment Nurse: Loletha Carrow, RN, Ronalee Belts Date/Time (Eastern Time): 11/29/2021 10:29:16 AM Confirm and document reason for call. If symptomatic, describe symptoms. ---Caller states she had a fever last night with night sweats and today she has throat tightness runny nose. no other s/s Does the patient have any new or worsening symptoms? ---Yes Will a triage be completed? ---Yes Related visit to physician within the last 2 weeks? ---No Does the PT have any chronic conditions? (i.e. diabetes, asthma, this includes High risk factors for pregnancy, etc.) ---Yes List chronic conditions. ---Dm pain Depression Is the patient pregnant or possibly pregnant? (Ask all females between the ages of 81-55) ---No Is this a behavioral health or substance abuse call? ---No Guidelines Guideline Title Affirmed Question Affirmed Notes Nurse Date/Time (Eastern Time) Cough - Acute Productive [1] Fever > 100.0 F (37.8 C) AND [2] diabetes mellitus Emch, RN, Ronalee Belts 11/29/2021 10:31:01   Guidelines Guideline Title Affirmed Question Affirmed Notes Nurse Date/Time Eilene Ghazi Time) or weak  immune system (e.g., HIV positive, cancer chemo, splenectomy, organ transplant, chronic steroids) Disp. Time Eilene Ghazi Time) Disposition Final User 11/29/2021 10:27:35 AM Send to Urgent Queue Jaynie Crumble 11/29/2021 10:34:04 AM See HCP within 4 Hours (or PCP triage) Yes Emch, RN, Ronalee Belts Final Disposition 11/29/2021 10:34:04 AM See HCP within 4 Hours (or PCP triage) Yes Emch, RN, Vicenta Dunning Disagree/Comply Comply Caller Understands Yes PreDisposition Did not know what to do Care Advice Given Per Guideline SEE HCP (OR PCP TRIAGE) WITHIN 4 HOURS: * IF OFFICE WILL BE OPEN: You need to be seen within the next 3 or 4 hours. Call your doctor (or NP/PA) now or as soon as the office opens. FEVER MEDICINE - ACETAMINOPHEN: * Fever above 101 F (38.3 C) should be treated with acetaminophen (e.g., Tylenol). This can be taken by mouth as pills or per rectum using a suppository. Both are available over the counter. Usual adult dose is 650 mg by mouth or per rectum every 6 hours. CALL BACK IF: * You become worse CARE ADVICE given per Cough - Acute Productive (Adult) guideline. Comments User: Doretha Sou, RN Date/Time Eilene Ghazi Time): 11/29/2021 10:36:05 AM backline NO ANSWER User: Doretha Sou, RN Date/Time Eilene Ghazi Time): 11/29/2021 10:43:44 AM Provided warm transfer to office staff per directives Referrals REFERRED TO PCP OFFICE

## 2021-11-29 NOTE — Telephone Encounter (Signed)
Patient was seen today by [place].

## 2021-11-29 NOTE — Patient Instructions (Addendum)
It was very nice to see you today!   Start generic Flonase or Nasacort, over the counter, 1 spray each nostril twice a day for 5-7 days, then reduce to daily or prn use.   DO NOT sniff - lower your head slightly & spray & just pinch your nose closed - sniffing causes it to go to back of throat and it does not taste good. This medicine takes up to 2 days to start working so want to always start on first day of symptoms and continue for 1-2 weeks to get full benefit.  Use nasal saline spray or Neti pot prior to Flonase spray and extra 1-2 times during the day to disinfect.  Drink plenty of fluids. Take Tylenol 1000mg  during day & at bedtime for any fever, headache, sore throat.  **I went ahead and sent the Prednisone to start on Saturday if you are not feeling better. Take this in the morning to avoid problems with sleep.     PLEASE NOTE:  If you had any lab tests please let us know if you have not heard back within a few days. You may see your results on MyChart before we have a chance to review them but we will give you a call once they are reviewed by [name]. If we ordered any referrals today, please let us know if you have not heard from their office within the next week.

## 2021-11-29 NOTE — Telephone Encounter (Signed)
Patient states: - Currently feels chest tightness, mainly where throat meets chest  - Coughing up sputum and had subjective fever last night - Believes she has bronchitis--Tested negative for Covid  Patient has been transferred to triage.

## 2021-11-29 NOTE — Progress Notes (Signed)
Patient ID: Deborah Soto, female    DOB: [birth year], 56 y.o.   MRN: [id number]  Chief Complaint  Patient presents with   Cough    Pt c/o cough(yellow),runny nose,chills, body aches,night sweats, headaches. Symptoms started yesterday started with throat irritation. Has tried tylenol, which did not help. Covid negative.     HPI:      Upper Respiratory Infection: Symptoms include  cough, productive, runny nose, chills, body aches, night sweats, headaches, and sore throat. .  Onset of symptoms was 1 day ago, gradually worsening since that time. She is drinking moderate amounts of fluids. Evaluation to date: none.  Treatment to date:  Tylenol . Pt reports hx of bronchitis years ago. pt is diabetic, but under control, last A1C 6.0.   Assessment & Plan:  1. Cough, unspecified type all rapid testing negative.  - POC COVID-19 - POCT Influenza A/B  2. Viral upper respiratory tract infection Sx started yesterday, mild cough. advised to start generic Flonase or Nasacort bid x 5-7d, then daily or prn. Advised on how to use, and to use nasal saline prior to [facility] and extra during day, hydrate well, sending prednisone pack to start on Saturday if sx are not improving as pt going out of town first of week.   - POC COVID-19 - POCT Influenza A/B - predniSONE (DELTASONE) 20 MG tablet; START Saturday if sx have not improved. Take 2 pills in the morning with breakfast for 3 days, then 1 pill for 2 days  Dispense: 8 tablet; Refill: 0   Subjective:    Outpatient Medications Prior to Visit  Medication Sig Dispense Refill   celecoxib (CELEBREX) 200 MG capsule TAKE ONE CAPSULE TWICE DAILY FOR chronic pain 180 capsule 0   Continuous Blood Gluc Receiver (DEXCOM G6 RECEIVER) DEVI Use 4 times daily as needed to check blood sugar 1 each PRN   Continuous Blood Gluc Receiver (DEXCOM G7 RECEIVER) DEVI Use 4 times daily to check blood sugar 1 each 11   Continuous Blood Gluc Sensor (DEXCOM G6 SENSOR) MISC  Apply topically 4 (four) times daily.     Continuous Blood Gluc Sensor (DEXCOM G7 SENSOR) MISC Use 4 times daily to check blood sugar. 1 each 11   Continuous Blood Gluc Transmit (DEXCOM G6 TRANSMITTER) MISC USE AS NEEDED TO CHECK BLOOD SUGAR FOUR TIMES DAILY     cyanocobalamin (,VITAMIN B-12,) 1000 MCG/ML injection One Injection weekly for 4 weeks then one injection monthly for 3 month 7 mL 0   Dextromethorphan-buPROPion ER (AUVELITY) 45-105 MG TBCR Take 1 tablet by mouth 2 (two) times daily. 180 tablet 0   Esketamine HCl, 84 MG Dose, (SPRAVATO, 84 MG DOSE,) 28 MG/DEVICE SOPK Place 84 mg into the nose See admin instructions. Use twice weekly     gabapentin (NEURONTIN) 300 MG capsule Take 2 capsules in the morning, 1 in the middle of the day, and 2 at night. (Patient taking differently: Take 2 capsules in the morning and 2 at night.) 150 capsule 3   Insulin Pen Needle (PEN NEEDLES) 33G X 4 MM MISC Use daily as needed to inject insulin 100 each 1   lithium carbonate (ESKALITH) 450 MG CR tablet TAKE TWO TABLETS BY MOUTH AT BEDTIME (Patient taking differently: Taking 1.5 tabs) 180 tablet 0   OLANZapine (ZYPREXA) 2.5 MG tablet Take 1 tablet (2.5 mg total) by mouth at bedtime. 30 tablet 2   pantoprazole (PROTONIX) 40 MG tablet TAKE ONE TABLET BY MOUTH ONCE DAILY FOR STOMACH PROTECTION  30 tablet 5   propranolol ER (INDERAL LA) 160 MG SR capsule TAKE ONE CAPSULE BY MOUTH ONCE DAILY 30 capsule 2   SYNJARDY XR 12.07-998 MG TB24 TAKE TWO TABLETS BY MOUTH DAILY 60 tablet 2   tirzepatide (MOUNJARO) 12.5 MG/0.5ML Pen Inject 12.5 mg into the skin once a week. 6 mL 0   tretinoin (RETIN-A) 0.025 % cream      Vilazodone HCl (VIIBRYD) 40 MG TABS TAKE 1 & 1/2 TABLETS BY MOUTH DAILY 45 tablet 0   Vitamin D, Ergocalciferol, 50000 units CAPS Take 1 capsule by mouth once a week.     XARELTO 20 MG TABS tablet Take 1 tablet (20 mg total) by mouth daily with supper. 30 tablet 5   MOUNJARO 15 MG/0.5ML Pen INJECT 0.5MLS (15MG )  SUBCUTANEOUSLY ONCE A WEEK (Patient not taking: Reported on 11/29/2021) 6 mL 0   Multiple Vitamin (MULTIVITAMIN WITH MINERALS) TABS tablet Take 1 tablet by mouth daily. (Patient not taking: Reported on 11/29/2021)     No facility-administered medications prior to visit.   Past Medical History:  Diagnosis Date   Anemia    Anxiety    Asthma    Depression    Diabetes mellitus type 2 in obese ([place]) 06/11/2016   Dyspnea    Embolism - blood clot January 1997 & January 2017   Also had LLE DVT in 1997   History of kidney stones    Hypertension    Preeclampsia 10/10/2011   1999    Sleep apnea    Past Surgical History:  Procedure Laterality Date   ANTERIOR CERVICAL DECOMPRESSION/DISCECTOMY FUSION 4 LEVELS N/A 08/17/2020   Procedure: ANTERIOR CERVICAL DECOMPRESSION FUSION CERVICAL THREE - CERVICAL FOUR, CERVICAL FOUR- CERVICAL FIVE, CERVICAL FIVE- CERVICAL SIX WITH INSTRUMENTATION AND ALLOGRAFT;  Surgeon: Phylliss Bob, MD;  Location: [facility];  Service: Orthopedics;  Laterality: N/A;   KIDNEY STONE SURGERY     Allergies  Allergen Reactions   Tramadol Other (See Comments)    Tingling all over    Hydrocodone     Ineffective       Objective:    Physical Exam Vitals and nursing note reviewed.  Constitutional:      Appearance: Normal appearance. She is obese.  HENT:     Right Ear: Tympanic membrane and ear canal normal.     Left Ear: Tympanic membrane and ear canal normal.     Nose:     Right Sinus: No maxillary sinus tenderness or frontal sinus tenderness.     Left Sinus: No maxillary sinus tenderness or frontal sinus tenderness.     Mouth/Throat:     Mouth: Mucous membranes are moist.     Pharynx: Posterior oropharyngeal erythema (mild) present. No pharyngeal swelling, oropharyngeal exudate or uvula swelling.  Cardiovascular:     Rate and Rhythm: Normal rate and regular rhythm.  Pulmonary:     Effort: Pulmonary effort is normal.     Breath sounds: Normal breath sounds.   Musculoskeletal:        General: Normal range of motion.  Skin:    General: Skin is warm and dry.  Neurological:     Mental Status: She is alert.  Psychiatric:        Mood and Affect: Mood normal.        Behavior: Behavior normal.    LMP  (LMP Unknown) Comment: last period ---  1 year ago (?) Wt Readings from Last 3 Encounters:  10/13/21 217 lb (98.4 kg)  10/03/21 222  lb (100.7 kg)  06/26/21 228 lb 12.8 oz (103.8 kg)       Dulce Sellar, NP

## 2021-12-04 ENCOUNTER — Encounter: Payer: Self-pay | Admitting: *Deleted

## 2021-12-04 ENCOUNTER — Encounter: Payer: Self-pay | Admitting: Family

## 2021-12-04 DIAGNOSIS — F332 Major depressive disorder, recurrent severe without psychotic features: Secondary | ICD-10-CM | POA: Diagnosis not present

## 2021-12-08 DIAGNOSIS — F332 Major depressive disorder, recurrent severe without psychotic features: Secondary | ICD-10-CM | POA: Diagnosis not present

## 2021-12-11 DIAGNOSIS — F332 Major depressive disorder, recurrent severe without psychotic features: Secondary | ICD-10-CM | POA: Diagnosis not present

## 2021-12-12 DIAGNOSIS — F332 Major depressive disorder, recurrent severe without psychotic features: Secondary | ICD-10-CM | POA: Diagnosis not present

## 2021-12-14 DIAGNOSIS — F332 Major depressive disorder, recurrent severe without psychotic features: Secondary | ICD-10-CM | POA: Diagnosis not present

## 2021-12-21 ENCOUNTER — Other Ambulatory Visit: Payer: Self-pay | Admitting: Family Medicine

## 2021-12-21 ENCOUNTER — Other Ambulatory Visit: Payer: Self-pay | Admitting: Psychiatry

## 2021-12-21 DIAGNOSIS — F332 Major depressive disorder, recurrent severe without psychotic features: Secondary | ICD-10-CM

## 2021-12-21 DIAGNOSIS — F411 Generalized anxiety disorder: Secondary | ICD-10-CM

## 2022-01-02 DIAGNOSIS — F332 Major depressive disorder, recurrent severe without psychotic features: Secondary | ICD-10-CM | POA: Diagnosis not present

## 2022-01-03 DIAGNOSIS — F332 Major depressive disorder, recurrent severe without psychotic features: Secondary | ICD-10-CM | POA: Diagnosis not present

## 2022-01-09 DIAGNOSIS — F332 Major depressive disorder, recurrent severe without psychotic features: Secondary | ICD-10-CM | POA: Diagnosis not present

## 2022-01-10 DIAGNOSIS — F332 Major depressive disorder, recurrent severe without psychotic features: Secondary | ICD-10-CM | POA: Diagnosis not present

## 2022-01-11 ENCOUNTER — Other Ambulatory Visit: Payer: Self-pay | Admitting: Psychiatry

## 2022-01-15 ENCOUNTER — Encounter: Payer: Self-pay | Admitting: Family Medicine

## 2022-01-15 ENCOUNTER — Ambulatory Visit: Payer: BC Managed Care – PPO | Admitting: Family Medicine

## 2022-01-15 VITALS — BP 108/70 | HR 61 | Temp 98.0°F | Ht 68.0 in | Wt 205.2 lb

## 2022-01-15 DIAGNOSIS — R52 Pain, unspecified: Secondary | ICD-10-CM

## 2022-01-15 DIAGNOSIS — E669 Obesity, unspecified: Secondary | ICD-10-CM | POA: Diagnosis not present

## 2022-01-15 DIAGNOSIS — J309 Allergic rhinitis, unspecified: Secondary | ICD-10-CM | POA: Diagnosis not present

## 2022-01-15 DIAGNOSIS — E1169 Type 2 diabetes mellitus with other specified complication: Secondary | ICD-10-CM | POA: Diagnosis not present

## 2022-01-15 LAB — POCT GLYCOSYLATED HEMOGLOBIN (HGB A1C): Hemoglobin A1C: 5 % (ref 4.0–5.6)

## 2022-01-15 MED ORDER — AZELASTINE HCL 0.1 % NA SOLN: 2.0000 | Freq: Two times a day (BID) | NASAL | 12 refills | Status: DC

## 2022-01-15 MED ORDER — GABAPENTIN 600 MG PO TABS: 600.0000 mg | ORAL_TABLET | Freq: Two times a day (BID) | ORAL | 3 refills | Status: DC

## 2022-01-15 NOTE — Progress Notes (Signed)
   Deborah Soto is a 56 y.o. female who presents today for an office visit.  Assessment/Plan:  Chronic Problems Addressed Today: Diabetes mellitus type 2 in obese ([facility]) A1c very well controlled 5.0.  Will continue Mounjaro 15 mg weekly and Synjardy 25-2000 daily.  Follow-up in 3 months to recheck A1c.  AR (allergic rhinitis) Has not had much success with over-the-counter meds.  We will try Astelin.  She can also use Flonase as needed.  Diffuse pain Tried weaning off of gabapentin however had recurrence of pain.  She is back on 600 mg twice daily.  Will refill today.  Continue Celebrex as needed as well.  Morbid obesity ([place]) Down about 12 pounds since last visit.  She is worried that her weight is plateauing.  We will continue her current medication regimen for now with [facility] and Synjardy. Follow up again in 3 months.      Subjective:  HPI:  See A/p for status of chronic conditions.         Objective:  Physical Exam: BP 108/70   Pulse 61   Temp 98 F (36.7 C) (Temporal)   Ht 5\' 8"  (1.727 m)   Wt 205 lb 3.2 oz (93.1 kg)   LMP  (LMP Unknown) Comment: last period ---  1 year ago (?)  SpO2 98%   BMI 31.20 kg/m   Wt Readings from Last 3 Encounters:  01/15/22 205 lb 3.2 oz (93.1 kg)  11/29/21 213 lb 9.6 oz (96.9 kg)  10/13/21 217 lb (98.4 kg)    Gen: No acute distress, resting comfortably CV: Regular rate and rhythm with no murmurs appreciated Pulm: Normal work of breathing, clear to auscultation bilaterally with no crackles, wheezes, or rhonchi Neuro: Grossly normal, moves all extremities Psych: Normal affect and thought content      Deborah Soto M. Jerline Pain, MD 01/15/2022 10:12 AM

## 2022-01-15 NOTE — Assessment & Plan Note (Signed)
Tried weaning off of gabapentin however had recurrence of pain.  She is back on 600 mg twice daily.  Will refill today.  Continue Celebrex as needed as well.

## 2022-01-15 NOTE — Assessment & Plan Note (Signed)
Has not had much success with over-the-counter meds.  We will try Astelin.  She can also use Flonase as needed.

## 2022-01-15 NOTE — Patient Instructions (Signed)
It was very nice to see you today!  Your A1c today looks great.  Please keep up the good work.  Please try the Astelin for your runny nose.  I will refill your gabapentin.  We will see you back in 3 months.  Come back to see [name] sooner if needed.  Take care, Dr Jerline Pain  PLEASE NOTE:  If you had any lab tests please let us know if you have not heard back within a few days. You may see your results on mychart before we have a chance to review them but we will give you a call once they are reviewed by [name]. If we ordered any referrals today, please let us know if you have not heard from their office within the next week.   Please try these tips to maintain a healthy lifestyle:  Eat at least 3 REAL meals and 1-2 snacks per day.  Aim for no more than 5 hours between eating.  If you eat breakfast, please do so within one hour of getting up.   Each meal should contain half fruits/vegetables, one quarter protein, and one quarter carbs (no bigger than a computer mouse)  Cut down on sweet beverages. This includes juice, soda, and sweet tea.   Drink at least 1 glass of water with each meal and aim for at least 8 glasses per day  Exercise at least 150 minutes every week.

## 2022-01-15 NOTE — Assessment & Plan Note (Signed)
Down about 12 pounds since last visit.  She is worried that her weight is plateauing.  We will continue her current medication regimen for now with [facility] and Synjardy. Follow up again in 3 months.

## 2022-01-15 NOTE — Assessment & Plan Note (Signed)
A1c very well controlled 5.0.  Will continue Mounjaro 15 mg weekly and Synjardy 25-2000 daily.  Follow-up in 3 months to recheck A1c.

## 2022-01-17 DIAGNOSIS — F332 Major depressive disorder, recurrent severe without psychotic features: Secondary | ICD-10-CM | POA: Diagnosis not present

## 2022-01-22 DIAGNOSIS — F332 Major depressive disorder, recurrent severe without psychotic features: Secondary | ICD-10-CM | POA: Diagnosis not present

## 2022-01-24 DIAGNOSIS — F332 Major depressive disorder, recurrent severe without psychotic features: Secondary | ICD-10-CM | POA: Diagnosis not present

## 2022-01-29 ENCOUNTER — Other Ambulatory Visit: Payer: Self-pay | Admitting: Psychiatry

## 2022-01-29 DIAGNOSIS — F332 Major depressive disorder, recurrent severe without psychotic features: Secondary | ICD-10-CM

## 2022-01-29 DIAGNOSIS — F411 Generalized anxiety disorder: Secondary | ICD-10-CM

## 2022-01-30 ENCOUNTER — Telehealth: Payer: Self-pay

## 2022-01-30 NOTE — Telephone Encounter (Signed)
Prior authorization renewal submitted for Vilazodone 40 mg, 1.5 tabs daily #45/30 to [employer], pending response

## 2022-02-06 ENCOUNTER — Telehealth: Payer: Self-pay | Admitting: Psychiatry

## 2022-02-06 DIAGNOSIS — F332 Major depressive disorder, recurrent severe without psychotic features: Secondary | ICD-10-CM | POA: Diagnosis not present

## 2022-02-06 NOTE — Telephone Encounter (Signed)
Please advise 

## 2022-02-06 NOTE — Telephone Encounter (Signed)
I see her in 1 week.  Do not stop or change it until the appt.

## 2022-02-06 NOTE — Telephone Encounter (Signed)
Next visit is 02/14/22. Deborah Soto wants to know if she can stop her Auvility as its not really helping her. She asks how does she taper down from it? Phone number is [phone].

## 2022-02-07 DIAGNOSIS — F332 Major depressive disorder, recurrent severe without psychotic features: Secondary | ICD-10-CM | POA: Diagnosis not present

## 2022-02-07 NOTE — Telephone Encounter (Signed)
Lvm with info and to rtc with questions

## 2022-02-09 ENCOUNTER — Other Ambulatory Visit: Payer: Self-pay | Admitting: Family Medicine

## 2022-02-09 ENCOUNTER — Ambulatory Visit (INDEPENDENT_AMBULATORY_CARE_PROVIDER_SITE_OTHER): Payer: BC Managed Care – PPO | Admitting: Family Medicine

## 2022-02-09 VITALS — BP 100/69 | HR 84 | Temp 97.3°F | Ht 68.0 in | Wt 206.6 lb

## 2022-02-09 DIAGNOSIS — J309 Allergic rhinitis, unspecified: Secondary | ICD-10-CM | POA: Diagnosis not present

## 2022-02-09 DIAGNOSIS — F332 Major depressive disorder, recurrent severe without psychotic features: Secondary | ICD-10-CM

## 2022-02-09 DIAGNOSIS — G4733 Obstructive sleep apnea (adult) (pediatric): Secondary | ICD-10-CM | POA: Diagnosis not present

## 2022-02-09 DIAGNOSIS — R52 Pain, unspecified: Secondary | ICD-10-CM

## 2022-02-09 DIAGNOSIS — E1169 Type 2 diabetes mellitus with other specified complication: Secondary | ICD-10-CM

## 2022-02-09 DIAGNOSIS — E669 Obesity, unspecified: Secondary | ICD-10-CM

## 2022-02-09 MED ORDER — GABAPENTIN 600 MG PO TABS: 900.0000 mg | ORAL_TABLET | Freq: Two times a day (BID) | ORAL | 3 refills | Status: DC

## 2022-02-09 MED ORDER — BACLOFEN 10 MG PO TABS: 10.0000 mg | ORAL_TABLET | Freq: Three times a day (TID) | ORAL | 5 refills | Status: DC

## 2022-02-09 NOTE — Assessment & Plan Note (Signed)
No red flags.  She did recently wean down on Spravato which could be contributing.  We will defer further management of this to her psychiatrist.  We discussed management options.  She is already on Celebrex 200 mg twice daily.  We will increase her gabapentin to 900 mg twice daily.  We did discuss 3 times daily dosing however she does admit she has a hard time remembering to take 3 times daily for twice daily dosing if possible.  We will also start baclofen 10 mg 3 times daily as needed.  She will follow-up in a few weeks via MyChart.  We can increase the dose of gabapentin and/or baclofen as needed.

## 2022-02-09 NOTE — Assessment & Plan Note (Addendum)
Sugars have been up a little bit though she does admit she has had more carb cravings recently and has gained a couple pounds over the last few weeks.  She is current on Mounjaro 15 mg weekly and Synjardy 12.07-998 twice daily and tolerating well. Her last A1c was at goal.  She will come back soon to recheck A1c.  She will try to avoid carbs.  It is possible that the worsening of her chronic pain has elevated her endogenous cortisol levels which could explain her recent increase in carb cravings and recent weight gain.  Hopefully if we can manage pain control better this will help as well.

## 2022-02-09 NOTE — Assessment & Plan Note (Signed)
Did not have success with Astelin.  Additionally caused some side effects.  She has not had much success with Xyzal either.  She will try Allegra.  She will let me know if not improving will consider referral to allergy.  Would avoid Singulair given her history of depression.
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
## 2022-02-09 NOTE — Assessment & Plan Note (Signed)
Follows with psychiatry.  Overall feels like her mood is currently stable even though she did decrease the dose of Spravato to once weekly.  We will defer further management to psychiatry.

## 2022-02-12 DIAGNOSIS — E119 Type 2 diabetes mellitus without complications: Secondary | ICD-10-CM | POA: Diagnosis not present

## 2022-02-13 DIAGNOSIS — F332 Major depressive disorder, recurrent severe without psychotic features: Secondary | ICD-10-CM | POA: Diagnosis not present

## 2022-02-14 ENCOUNTER — Ambulatory Visit: Payer: BC Managed Care – PPO | Admitting: Psychiatry

## 2022-02-14 ENCOUNTER — Encounter: Payer: Self-pay | Admitting: Psychiatry

## 2022-02-14 DIAGNOSIS — R52 Pain, unspecified: Secondary | ICD-10-CM

## 2022-02-14 DIAGNOSIS — G4710 Hypersomnia, unspecified: Secondary | ICD-10-CM

## 2022-02-14 DIAGNOSIS — F411 Generalized anxiety disorder: Secondary | ICD-10-CM | POA: Diagnosis not present

## 2022-02-14 DIAGNOSIS — G4733 Obstructive sleep apnea (adult) (pediatric): Secondary | ICD-10-CM | POA: Diagnosis not present

## 2022-02-14 DIAGNOSIS — F339 Major depressive disorder, recurrent, unspecified: Secondary | ICD-10-CM | POA: Diagnosis not present

## 2022-02-14 DIAGNOSIS — F332 Major depressive disorder, recurrent severe without psychotic features: Secondary | ICD-10-CM | POA: Diagnosis not present

## 2022-02-14 NOTE — Progress Notes (Signed)
Deborah Soto [id number] 04-16-1965 56 y.o.     Subjective:   Patient ID:  Sabrinia Prien is a 56 y.o. (DOB [birth year]) female.  Chief Complaint:  Chief Complaint  Patient presents with   Follow-up   Depression   Fatigue   Depression        Associated symptoms include fatigue and myalgias.  Associated symptoms include no decreased concentration.   Chailyn Racette presents to the office today for follow-up of TRD and anxiety, and insomnia.  seen December 08, 2018.  She was complaining of lithium tremor and some cognitive problems.  Hydroxyzine was stopped.  Propranolol increased to 60 mg twice daily for tremor.  Lithium level and amitriptyline levels were requested. Amitriptyline level to 37, nortriptyline 49 on amitriptyline 250 mg nightly. Lithium level 0.800 mg daily  Phone call on October 6 to discuss lab results as follows: Note   ----- Message from [name]., MD sent at 12/15/2018 10:08 AM EDT ----- Lithium level stable at 0.8 in a good range.  The previous was 0.7.  She is having some tremor issues.   Normal BMP including excellent creatinine and calcium levels.  Blood sugar is high but she is aware of that problem.   TSH is within normal limits.   Serum amitriptyline level is 286 which is at the upper end of the normal range and suggestive that we not try further increase.  We will discuss further options at her follow-up appointment.   No medication adjustments required, but because of her tremor and her lithium level is slightly higher than it was with the prior level if she wants to reduce the lithium from 3 of the 300 mg tablets daily to 2-1/2 of the 300 mg tablets daily she can do so.  At her last appointment she was also encouraged to try a higher dose of propranolol and that may have resolved the problem.  If it did do not reduce the lithium but if it did not and she wants to reduce the dose she can do so as noted.  She should call us if she has any  recurrence of symptoms.   [name], [name]     She had repeat lithium level at 750 mg daily of 0.  7 with some improvement in tremor.  In phone call on November 20 she reported mood was worse with weepiness, overreacting, "pity party".  Because mood symptoms were worse with the reduction lithium she was encouraged to increase the dosage back to 900 mg daily.   Last seen February 04, 2019.  The following changes were made: Increase propranolol to 80 mg twice daily for lithium tremor and anxiety her blood pressure and pulse appear high enough that she can tolerate this dosage.  Disc Se.  Disc risk with low BP and DM.  He has not been having any problems with low blood sugar. Amitriptyline 6 or 300 mg daily if tolerated.  Try to take some about 4 -5 hours before sleep Try to take lithium also 4-5 hours before sleep to minimize daytime tremor Add methylphenidate ER 27 mg in the morning.  If no response we will increase the dosage.  Seen May 08, 2019 with her husband. Aside from physically feeling like she can't do much then having memory issues.  Forgetful.  Loses track of thoughts between tasks.  Most embarrassing being around people and word-finding issues.   Problems with walking bc balance and weakness.  Fears falling. Only fall at  Xmas morning.  Tremor is awful.  Affects keying.   H notices cognitive problems and forgetfulness.    Shaking got really bad and got lithium level as low as 600 mg but the next day felt really helpless and everything seemed to be aimed at hurting me and felt disrespected.  Problems with boss and 56 yo taking 5 AP classes and 2 volleyball classes.  I've been on her to accomplish all these things.  She claimed that pt screaming at her.   H CO she was more irritable after the reduction in lithium.   H CO her anxiety also seems worse.  H says the last year has been unhealthy with 12 hour work days for her.   Doesn't go out much.    Attention problems more  noticeable.  Still dropping and tremor.  Tolerated the increase in propranolol.    CO shakiness from lithium which interferes with typing. Also a good bit of anxiety generally without panic.  BS are high.  NotTaking hydroxyzine.   sleeping better and still using CPAP.  Using CPAP regularly. Less awakening than before.  No SE. depression was worse after reducing the lithium.  Irritability was also worse after reducing the lithium to 600 mg daily.  seen May 08, 2019.  Multiple changes made:  Reduce propranolol to 60 mg twice daily for lithium tremor and anxiety since the increase was not helpful.   Restart B6 for lithium tremor. 500 mg BID. She forgot and doesn't want more. Try to take lithium also 4-5 hours before sleep to minimize daytime tremor. depression was worse after reducing the lithium.  Irritability was also worse after reducing the lithium to 600 mg daily. So reluctant to reduce it further.  It is unclear how to address the benefits of lithium without using lithium other than to consider Spravato  Increase methylphenidate ER 36 mg in the morning. Increase methylphenidate ER 36 mg in the morning.  Wait 1-2 weeks then reduce amitriptyline to 5 tablets nightly to see if cognition is better. Also saw Dr. [name] re: sleep disorder.  05/2019 appt with the following noted: Lost Rx of Concerta 36 so didn't take it long.  Out of it.  Still shakey and cognitive problems.  Dep 5/10/  Anxiety 6/10 worse at night.  No SI.  8 hours sleep.  Mild panic couple times daily. Frustrated with tremor and sleepiness which is worse than tiredness.   History of low vitamin D taking 150K/week but stopped 6 mos ago. Plan : Modafinil 100 mg tablet 1 dailly for 1 week, Then add Concerta 36 mg 1 each AM.  Option increase modafinil mid April  Restart B6 for lithium tremor. 500 mg BID. Get lab test at earliest convience  10/14/2019 appointment with the following noted: Has been on amitriptyline 250 mg for  mos.  Tried a week ago reducing to 200 and felt worse so back on 250. Weaned off Lyrica about a month ago.  Not much change off it.   Not sure if it helped energy or alertness. Saw Dr. Cardell Peach in consultation. Mental clarity is a lot better with stimulants and not sleeping as much in daytime with current meds.  Severe drowsiness is a lot better.   Off metformin and added Glipizide.   Not great with depression 5/10.  Anxiety can trigger problems with thinking and somatic sx   I feel like it's something physical and can keep her up at night. Occ racing heart or feeling hot.  Caffeine variable.  Plan:Plan: Increase methylphenidate from 36 to 54 mg in the morning. Increase amitriptyline to 6 tablets in the evening    11/12/19 TC noting call: Pt called stated she's not feeling well at all. Ask for sooner apt than 10/7. Depression, anxiety & focus is much worse. Has not planned to hurt herself but thoughts of things would be better if I was not here have crossed her mind often. Contact ASAP @ [phone].  I put Pt on canc list  MD response: I reviewed the meds and previous med lists.  The next options are either Spravato or a major med change from amitriptyline which is not something I can do outside of an appt.  She's on cancellation list and we'll try to work her in sooner.  Nothing I can change right now except since the increase in amitriptyline has not helped, she can reduce it to 200 mg nightly in preparation for other changes to come. Nursing response after talking with pt: Discussed symptoms and medications with patient, she did not increase her Amitriptyline 50 mg up to 6 tablets so she will start that today. Said you may have told her that but she didn't remember. She has read about Spravato but she is not wanting to proceed with that. Informed her she's on cancellation list as well. She will call back with worsening signs or symptoms.  11/23/19 appt with the following noted: Forgot to increase  amitriptyline until noted. Tolerating it OK but hasn't had time to help. More alert with Concerta increase to 54 mg daily also with modafinil.  A little more focused and better productivity.  Still forgetful.   No SE noted except GI issues from diarrhea to constipation.   Still miserable and cry easily.  Try to numb herself.  Always tired especially emotionally.  Anxiety is high and predates current stimulants. No therapy since [facility].  She felt he pushed too hard for her to make changes.  I don't feel like I can deal with him right now. Plan: Continue meds & The increase in amitriptyline to 300 mg daily needs more time.  01/12/2020 appointment with the following noted: Not well.  Mood horrible.  Easily frustrated sometimes without reason and may nap or cry.  Anxiety is pretty bad.  Daytime sleepiness seems worse.  Can get nausea from anxiety.  Feels like function is slow.  Have to write everything down.  Hates home, job, high school volleyball program.  At some point I have to take responsibility.   Amitriptyline also not helping pain any.  Clenching jaw for 3 weeks. Likcking lips.  Picking fingers. D will play volleyball at [employer] [place]. Plan: Reduce amitriptyline by 1 tablet every 4 days. Start Viibryd 10 mg daily with food for 7 days, then 20 mg daily for 7 days, then increase to 40 mg daily.  03/18/20 appt urgently made and noted:  Seen with husband. Still with jaw clenching day and night despite off stimulant. Now on Viibryd 60 mg since 03/02/20 and no stimulant., lithium 900 mg, and propranolol 80 BID. Rare hydroxyzine DT sleepiness, Gabapentin 300 TID. Not taking it with food.  LT sensitivity to noise and light and overstimulated and now bothering her more at work.  Always had to use shirts without tags and sensitivity to smells. Easily agitated in public per H.  Gets negative and wants to give up and not live anymore.  She says there's less chance of acting on it if she tells  H about it and she's doing so now.  Pain worse off amitriptyline esp around ribs and general diffuse pain.  Anxiety worse with stimulants and hasn't gone away.  Trouble staying asleep. Plan: Need to take Viibryd 60 with food emphasized.  She's not currently doing so. Restart low-dose amitriptyline for pain  04/22/2020 appointment with following noted: Only restarted amitriptyline 50 mg last week.  Stays asleep better with it.  Sleep 8-9 hours now but no change in pain yet.  Sleepier in day recently. Done a lot of travel lately. Maybe viibryd making a little difference with mood and anxiety now that has been on it longer.  Needs to take Viibryd in AM bc seems to interfere with sleep. Final season of travel volleyball with good team. H notes still some irritability. Pain worse off amitriptyline.  Can reduce to  amitriptyline 25 for pain if 50 mg remains too sedating.  Is sleeping better.      06/21/2020 appointment with following noted:  Seen with H Tolerating amitriptyline 50 mg HS with decent sleep. Really crappy and as bad as 10 years.  Situationally life sucks.  Work environment is bad with coworkers.  Since Sept work is bad.  Doesn't feel people are supportive at home or work. H working 12 hour days.  When she's off wants to sleep all day.   Still in therapy with Gerald Stabs.  Is good therapist. 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  Check lithium level Referred to Dr. Toy Care for [facility]  11/03/2020 appointment with the following noted: 56 yo D got Covid but is OK. Still doing Spravato 84 mg twice weekly and plans to continue for awhile. And for a total of 3 mos.  Tolerated well.  It's a nice feeling.   Didn't seem to help the depression for a couple of months.   It does feel different. SI has resolved lately which is particularly impressive given she  has a lot of free time. She has lot s of time with nothing to do bc quit her job. Job environment got worse and worse.  Had been there 9 years.   Doing PT for pain in her back.  Ongoing physical issues.   Mood clearly better with Spravato but not resolved.   Neck fusion recently. Plan: check lithium level Reduce olanzapine to 5 mg HS  12/30/2020 appointment with the following noted: Surges of anxious feelings in her chest without heart racing several times daily last 15 mins and without pcpt.  Stay a little on edge all the time.   Left job and too much time on her hands. 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  She's doing well statistically.  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 Spravato twice weekly.  Is better with it but still some mild cycling of depression.  Still upset over leaving work.  SI very rare and fleeting.  Had a really good plan but doesn't dwell on the plan. Lithium level 0.7 on 900 mg HS. Reduced olanzapine to 5 mg HS and has felt a little worse. Viibryd 60 mg daily. Amitrptyline 50 for pain.  Not sure if it helps.  PT helped a lot with tension in neck and down spine. Sleeping pretty well after 8 hours. Plan increase olanzapine back to 10 mg nightly for treatment resistant depression and anxiety.  Anxiety was worse after  reducing it and no symptoms were improved after reducing it. Try to stop amitriptyline because it is probably not needed in the presence of olanzapine. Check lithium level  03/14/2021 appointment noted: Increase olanzapine to 10 mg for anxiety spells that are unexpected and hit her in the chest esp in the morning. Very low right now.  Twice weekly Spravato and felt good for awhile.  Maybe a slump after Xmas.  Not much social interaction since she quit her job.  Xmas gave her focus.  Doesn't have this now.   Mark working 14-16 hours daily.  This won't get better. Kids are doing great but not around Sleep well generally and that is a benefit of  olanzapine. Tolerates it well.   Plan: Trial increase olanzapine to 15 mg HS  05/03/2021 appointment with the following noted: Still getting Spravato twice weekly.   I love it.  Totally relaxed Depression and anxiety SI is gone or fleeting with Spravato. Still in therapy with Anson Fret and thinks a lot of stuff go back to childhood.  Was always criticized for normal childhood mistakes.  So coped by overcompensating with going big and grand. Can't get energy up to do anything. Never felt she had a manic periods except overstimulated CC anxious feeling in body tingling and tight uncomfortable feeling.  Reminds her of stimulant effects but less intense and worse in AM. Everything still seems to hard like going to grocery store and worry over messing up cooking. Watches too much TV For a little while increase olanzapine seemed to help.  Tolerating it. Depression is not that bad compared to the past. Plan: no med changes  07/17/21 appt noted:  seen with H Depression with twice weekly Spravato. But a lot of general anxiety and then spells of getting tingly and nervous.  Not triggers.   Using propranolol BID. Anxiety is continuation of the way it was before.  Doesn't seem to vary with time of day.  Awakens with anxiety. No SE noted except sleepy.  Sleeping at least 10-11 hours.  Not obviously worse with increase olanzapine.  Viibryd in AM bc insomnia otherwise. Balance trouble with hunched walk and she feels like legs are waker than they should be. Plan: Stop regular propranolol and start ER propranolol ER 1 at night for anxiety Reduce clonidine to 1/2 tablet twice daily Wait 1 week and reduce olanzapine to 1/2 tablet nightly to see if energy, alertness and balance are better. Starting June 1 stop clonidine.  09/05/21 appt noted: Still low energy.  No changes with less meds. Anxiety a little better last week for unclear reasons. She feels olanzapine helps sleep and doesn't want to stop it.   Sleep a lot.  11-11 if she can. Therapist working with her on childhood trauma. Long period of depression. Plan: Start Auvelity 1 daily for a week then 1 twice daily  11/27/21 appt noted:  Couple of phone calls and tremor problems and needed to reduce lithium to 1 and 1/2 tablets and it helped reduce the tremor. Maybe a little improvement.  Not so depressed but kind of numb.   Spells of anxiety but a little better.  Can feel it in chest and arms at times every other night.   Continues spravato twice weekly.   Sleep is great. Off clonidne.  Contiues Aubelity twice daily, lithium 675 mg daily, olanzapine 5 mg HS, viibryd 60 mg daily. Sleep from 11-9 and sleeps well.  02/14/22 appt noted: Noticed FM got better in summer  and then relapsed.  Balance worth with FM also.   Did reduce Spravato from twice weekly to weekly in October.  Did it twice weekly for a year now.  Thinks maybe that is why FM flared.  Continues to weekly. No SI in over a years.  Better dresssed and using makeup now.  Wasn't for about 5 years.. Getting closer to feeling normal .  Depression is still good. Reduced and increased gabapentin to 900 BID. No other med changes except baclofen prn which is marginally helpful. Feels better and got motivated to buy a car.  She's more active and less depressed than she has been.  Gets showered and dress daily before leaving [place]. Less numb emotionally than when last here.  Some excitement. Asks about coming off the Auvelity to reduce meds. A1C is 5.   Sleep is better and using CPAP  ECT-MADRS    Flowsheet Row Office Visit from 05/03/2021 in [facility]  MADRS Total Score 32      PHQ2-9    Flowsheet Row [facility]  [facility]  PHQ-2 Total Score 0 3 4 6 6   PHQ-9 Total Score 0 10 11 19 19       Flowsheet Row [facility]) [facility] Pre-Admission Testing 62 [facility] P[facility] TESTING  C-SSRS RISK CATEGORY No Risk No Risk        Maggie [id number] and dating.  Past Psychiatric Medication Trials: Failed ECT and TMS,  Spravato amitriptyline helped for 2 to 3 years in 1987,   2021 No benefit from amitriptyline 300 mg with adequate duration.  Nortriptyline 200 with a therapeutic blood level. Trintellix, duloxetine 120, Lexapro 20, Fetzima,  Wellbutrin, fluoxetine, paroxetine, sertraline,  (Poor response to SSRIs) Viibryd 60 [place],   lithium SE,  Latuda 120, Abilify 15,  Vraylar, Rexulti,Seroquel 400, risperidone, Geodon,  Haldol for agitation, lamotrigine 300,  buspirone, clonazepam, gabapentin, Propranolol, clonidine 0.1 mg BID modafinil, pramipexole,  Nuvigil, Vyvanse, Concerta SHE DOESN'T WANT STIMULANTS AGAIN bc of anxiety. Trazodone,   GENESIGHT COMPLETED   history of overdose on Xanax, There is a history of suicide attempts and psychiatric hospitalizations.  Review of Systems:  Review of Systems  Constitutional:  Positive for fatigue. unchanged Cardiovascular:  Negative for chest pain and palpitations.  Gastrointestinal:  Negative for diarrhea.  Musculoskeletal:  Positive for back pain and myalgias. Negative for gait problem.       Less pain lately.  Neurological:  Positive for tremors .  Negative for dizziness.  Psychiatric/Behavioral:  Positive for dysphoric mood. Negative for confusion, decreased concentration, hallucinations and self-injury. The patient is nervous/anxious.   No changes  Medications: I have reviewed the patient's current medications.  Current Outpatient Medications  Medication Sig Dispense Refill   baclofen  (LIORESAL) 10 MG tablet Take 1 tablet (10 mg total) by mouth 3 (three) times daily. 90 each 5   celecoxib (CELEBREX) 200 MG capsule TAKE ONE CAPSULE TWICE DAILY FOR chronic pain 180 capsule 0   Continuous Blood Gluc Receiver (DEXCOM G6 RECEIVER) DEVI Use 4 times daily as needed to check blood sugar 1 each PRN   Continuous Blood Gluc Receiver (DEXCOM G7 RECEIVER) DEVI Use 4 times  daily to check blood sugar 1 each 11   Continuous Blood Gluc Sensor (DEXCOM G6 SENSOR) MISC Apply topically 4 (four) times daily.     Continuous Blood Gluc Sensor (DEXCOM G7 SENSOR) MISC USE AS DIRECTED FOUR TIMES DAILY TO CHECK BLOOD SUGAR 1 each 11   Continuous Blood Gluc Transmit (DEXCOM G6 TRANSMITTER) MISC USE AS NEEDED TO CHECK BLOOD SUGAR FOUR TIMES DAILY     cyanocobalamin (,VITAMIN B-12,) 1000 MCG/ML injection One Injection weekly for 4 weeks then one injection monthly for 3 month 7 mL 0   Dextromethorphan-buPROPion ER (AUVELITY) 45-105 MG TBCR Take 1 tablet by mouth 2 (two) times daily. 180 tablet 0   Esketamine HCl, 84 MG Dose, (SPRAVATO, 84 MG DOSE,) 28 MG/DEVICE SOPK Place 84 mg into the nose See admin instructions. Use twice weekly     gabapentin (NEURONTIN) 300 MG capsule Take 2 capsules in the morning, 1 in the middle of the day, and 2 at night. (Patient taking differently: 1 DAILY WITH 600 MG) 150 capsule 3   gabapentin (NEURONTIN) 600 MG tablet Take 1.5 tablets (900 mg total) by mouth 2 (two) times daily. (Patient taking differently: Take 900 mg by mouth 2 (two) times daily. 1 daily) 180 tablet 3   Insulin Pen Needle (PEN NEEDLES) 33G X 4 MM MISC Use daily as needed to inject insulin 100 each 1   lithium carbonate (ESKALITH) 450 MG CR tablet TAKE TWO TABLETS BY MOUTH AT BEDTIME (Patient taking differently: 1.5 tabs daily) 180 tablet 0   MOUNJARO 15 MG/0.5ML Pen INJECT 0.5MLS SUBCUTANEOUSLY ONCE A WEEK 6 mL 11   OLANZapine (ZYPREXA) 2.5 MG tablet Take 1 tablet (2.5 mg total) by mouth at bedtime. 30 tablet 2    pantoprazole (PROTONIX) 40 MG tablet TAKE ONE TABLET BY MOUTH ONCE DAILY FOR STOMACH PROTECTION 30 tablet 5   propranolol ER (INDERAL LA) 160 MG SR capsule TAKE ONE CAPSULE BY MOUTH ONCE DAILY 30 capsule 2   SYNJARDY XR 12.07-998 MG TB24 TAKE TWO TABLETS BY MOUTH DAILY 60 tablet 2   Vilazodone HCl (VIIBRYD) 40 MG TABS TAKE 1 & 1/2 TABLETS BY MOUTH DAILY 45 tablet 0   Vitamin D, Ergocalciferol, 50000 units CAPS TAKE ONE CAPSULE BY MOUTH EVERY 7 DAYS 15 capsule 3   XARELTO 20 MG TABS tablet TAKE ONE TABLET BY MOUTH DAILY WITH SUPPER 30 tablet 5   No current facility-administered medications for this visit.    Medication Side Effects: Sedation  Allergies:  Allergies  Allergen Reactions   Tramadol Other (See Comments)    Tingling all over    Hydrocodone     Ineffective     Past Medical History:  Diagnosis Date   Anemia    Anxiety    Asthma    Depression    Diabetes mellitus type 2 in obese (HCC) 06/11/2016   Dyspnea    Embolism - blood clot January 1997 & January 2017   Also had LLE DVT in 1997   History of kidney stones    Hypertension    Preeclampsia 10/10/2011   1999    Sleep apnea     Family History  Problem Relation Age of Onset   Dementia Father    Heart disease Father    Clotting disorder Mother    Arthritis Mother    Clotting disorder Sister    Clotting disorder Maternal Grandmother    Clotting disorder Maternal Aunt    Rheumatologic disease Neg Hx    Hyperparathyroidism Neg  Hx     Social History   Socioeconomic History   Marital status: Married    Spouse name: Not on file   Number of children: Y   Years of education: Not on file   Highest education level: Not on file  Occupational History   Occupation: admin assist    Comment: insurance verification  Tobacco Use   Smoking status: Former    Packs/day: 1.00    Years: 20.00    Total pack years: 20.00    Types: Cigarettes    Quit date: 03/12/2004    Years since quitting: 17.9   Smokeless tobacco:  Never  Vaping Use   Vaping Use: Never used  Substance and Sexual Activity   Alcohol use: Yes    Alcohol/week: 1.0 standard drink of alcohol    Types: 1 Cans of beer per week    Comment: 1-2 times a week   Drug use: No   Sexual activity: Yes    Partners: Male    Birth control/protection: None  Other Topics Concern   Not on file  Social History Narrative   Originally from [place]. Always lived in [place]. 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 No international travel recently. Previously has been to [place] in May 1996. No mold exposure recently. No bird exposure. Does have multiple pets.    Social Determinants of Health   Financial Resource Strain: Not on file  Food Insecurity: Not on file  Transportation Needs: Not on file  Physical Activity: Not on file  Stress: Not on file  Social Connections: Not on file  Intimate Partner Violence: Not on file    Past Medical History, Surgical history, Social history, and Family history were reviewed and updated as appropriate.   Please see review of systems for further details on the patient's review from today.   Objective:   Physical Exam:  LMP  (LMP Unknown) Comment: last period ---  1 year ago (?)  Physical Exam Constitutional:      General: She is not in acute distress. Musculoskeletal:        General: No deformity.  Neurological:     Mental Status: She is alert and oriented to person, place, and time.     Cranial Nerves: No dysarthria.     Coordination: Coordination normal.  Psychiatric:        Attention and Perception: Attention and perception normal. She does not perceive auditory or visual hallucinations.        Mood and Affect: Mood is [facility] LESS anxious and depressed. Affect is not labile, blunt, angry, tearful or inappropriate.    Not as severe as in the past. Not irritable.    Speech: Speech normal.        Behavior: Behavior normal. Behavior is cooperative.        Thought Content: Thought content normal. Thought  content is not paranoid or delusional. Thought content does not include homicidal or suicidal ideation. Thought content does not include suicidal plan.        Cognition and Memory: Cognition and memory normal.        Judgment: Judgment normal.     Comments: Insight intact    Lab Review:     Component Value Date/Time   NA 140 11/22/2020 1223   NA 138 08/21/2019 0000   NA 140 12/11/2016 1236   K 4.1 11/22/2020 1223   K 4.2 12/11/2016 1236   CL 104 11/22/2020 1223   CO2 26 11/22/2020 1223   CO2  24 12/11/2016 1236   GLUCOSE 151 (H) 11/22/2020 1223   GLUCOSE 109 12/11/2016 1236   BUN 8 11/22/2020 1223   BUN 12 08/21/2019 0000   BUN 15.1 12/11/2016 1236   CREATININE 0.66 11/22/2020 1223   CREATININE 0.68 02/22/2020 1649   CREATININE 0.8 12/11/2016 1236   CALCIUM 9.6 11/22/2020 1223   CALCIUM 10.0 12/11/2016 1236   PROT 6.9 11/22/2020 1223   PROT 7.5 12/11/2016 1236   ALBUMIN 4.3 11/22/2020 1223   ALBUMIN 4.4 12/11/2016 1236   AST 40 (H) 11/22/2020 1223   AST 38 (H) 12/11/2016 1236   ALT 40 (H) 11/22/2020 1223   ALT 61 (H) 12/11/2016 1236   ALKPHOS 65 11/22/2020 1223   ALKPHOS 71 12/11/2016 1236   BILITOT 0.7 11/22/2020 1223   BILITOT 0.62 12/11/2016 1236   GFRNONAA >60 08/16/2020 1157   GFRNONAA 105 12/11/2018 1121   GFRAA 122 12/11/2018 1121       Component Value Date/Time   WBC 7.4 11/22/2020 1223   RBC 4.88 11/22/2020 1223   HGB 13.1 11/22/2020 1223   HGB 14.5 12/11/2016 1236   HCT 41.4 11/22/2020 1223   HCT 43.9 12/11/2016 1236   PLT 262.0 11/22/2020 1223   PLT 289 12/11/2016 1236   PLT 295 07/24/2016 1848   MCV 85.0 11/22/2020 1223   MCV 93.2 12/11/2016 1236   MCH 29.9 08/16/2020 1157   MCHC 31.6 11/22/2020 1223   RDW 16.4 (H) 11/22/2020 1223   RDW 13.5 12/11/2016 1236   LYMPHSABS 2.2 11/22/2020 1223   LYMPHSABS 2.4 12/11/2016 1236   MONOABS 0.6 11/22/2020 1223   MONOABS 0.7 12/11/2016 1236   EOSABS 0.1 11/22/2020 1223   EOSABS 0.7 (H) 12/11/2016 1236    BASOSABS 0.0 11/22/2020 1223   BASOSABS 0.0 12/11/2016 1236    Lithium Lvl  Date Value Ref Range Status  11/22/2020 0.7 0.6 - 1.2 mmol/L Final   11/22/20  lithium 0.7 on 900   Serum nortriptyline level on 150 mg a day was 109  Amitriptyline  Level 12/11/2018 =237  On 250 mg daily,.  12/23/2020 vitamin D level 31.38, vitamin B12 519  No results found for: "PHENYTOIN", "PHENOBARB", "VALPROATE", "CBMZ"   .res Assessment: Plan:    Recurrent major depression resistant to treatment (HCC)  Generalized anxiety disorder  Hypersomnolence  Obstructive sleep apnea  Diffuse pain   Raye has chronic severe major depression and generalized anxiety that is treatment resistant and failed multiple medications ECT and TMS as noted above including SSRIs, try cyclic's, lithium, and atypical antipsychotics for depression.. She has had partial benefit from amitriptyline plus lithium.  However  tremor problems from the lithium.  Efforts to reduce the lithium have led to worsening psychiatric symptoms.  There has been a discussion about possible bipolar elements but she has no clear manic episodes unrelated to medication changes.  She has features of atypical depression.  So an MAO inhibitor is attractive from that perspective. Not sig depressed and numbness better.  H agrees.  Discussed Spravato option in detail.  She wants to transition Spravato back to this office.  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 Less numb  Statistically speaking the most potentially beneficial option would be Parnate or Nardil but they are difficult to use   Need to take Viibryd 60 with food emphasized.   check lithium level 0.7 on 900 mg.  Reuced to 675 DT tremor.  continue Auvelity 1 twice daily BC  partial benefit Disc SE.   Rec not stop it at the same time of reducing the Spravato ion October 2023  Rec counseling.  She's back seeing Kennon Rounds.. Needs more  purpose in her day. She needs a fulfilling job.   Encourage physical activity and weight loss.  She is not particularly motivated and feels that is hard to accomplish because of chronic pain.  Chronic pain complicates her treatment of depression.  Recent B12 deficiency dx and treatment started. vitamin D bc level 31 on 2000U daily  Hold B6 for lithium tremor. 500 mg BID. If needed.  Tremor is better.  None currently.  Try to take lithium also 4-5 hours before sleep to minimize daytime tremor. depression was worse after reducing the lithium.  Irritability was also worse after reducing the lithium to 600 mg daily.   Disc weith loss meds Monjarou recently started for DM and goals.  Last checked lost 17#.  BS is OK.  Has continuous glucose monitor.  Take vitamin D regularly.  Follow-up 8 weeks  [name], [name]  Future Appointments  Date Time Provider Department Center  04/16/2022 10:00 AM Ardith Dark, MD [facility]    No orders of the defined types were placed in this encounter.      -------------------------------

## 2022-02-16 DIAGNOSIS — L719 Rosacea, unspecified: Secondary | ICD-10-CM | POA: Diagnosis not present

## 2022-02-16 DIAGNOSIS — L700 Acne vulgaris: Secondary | ICD-10-CM | POA: Diagnosis not present

## 2022-02-18 ENCOUNTER — Other Ambulatory Visit: Payer: Self-pay | Admitting: Psychiatry

## 2022-02-18 ENCOUNTER — Other Ambulatory Visit: Payer: Self-pay | Admitting: Family Medicine

## 2022-02-18 DIAGNOSIS — F411 Generalized anxiety disorder: Secondary | ICD-10-CM

## 2022-02-18 DIAGNOSIS — M797 Fibromyalgia: Secondary | ICD-10-CM

## 2022-02-20 DIAGNOSIS — F332 Major depressive disorder, recurrent severe without psychotic features: Secondary | ICD-10-CM | POA: Diagnosis not present

## 2022-02-21 DIAGNOSIS — F332 Major depressive disorder, recurrent severe without psychotic features: Secondary | ICD-10-CM | POA: Diagnosis not present

## 2022-02-21 NOTE — Telephone Encounter (Signed)
Approved effective 01/30/2022-01/29/2023 with [employer] Vilazodone 40 mg 1.5 tablets

## 2022-02-22 ENCOUNTER — Other Ambulatory Visit: Payer: Self-pay | Admitting: Psychiatry

## 2022-02-22 DIAGNOSIS — F411 Generalized anxiety disorder: Secondary | ICD-10-CM

## 2022-02-22 DIAGNOSIS — F332 Major depressive disorder, recurrent severe without psychotic features: Secondary | ICD-10-CM

## 2022-02-26 ENCOUNTER — Telehealth: Payer: Self-pay | Admitting: Family Medicine

## 2022-02-26 DIAGNOSIS — H02413 Mechanical ptosis of bilateral eyelids: Secondary | ICD-10-CM | POA: Diagnosis not present

## 2022-02-26 DIAGNOSIS — H04553 Acquired stenosis of bilateral nasolacrimal duct: Secondary | ICD-10-CM | POA: Diagnosis not present

## 2022-02-26 DIAGNOSIS — H0279 Other degenerative disorders of eyelid and periocular area: Secondary | ICD-10-CM | POA: Diagnosis not present

## 2022-02-26 DIAGNOSIS — H02423 Myogenic ptosis of bilateral eyelids: Secondary | ICD-10-CM | POA: Diagnosis not present

## 2022-02-26 DIAGNOSIS — H04223 Epiphora due to insufficient drainage, bilateral lacrimal glands: Secondary | ICD-10-CM | POA: Diagnosis not present

## 2022-02-26 NOTE — Telephone Encounter (Signed)
Reason for Referral Request:  See previous visit  Has Patient been seen by PCP for this complaint? Yes  No, Please schedule patient for appointment for complaint.  Yes, Please find out following information:  Reason:  Referral to which Specialty:ENT/Allergy  (Patient requests ENT)  Preferred office/ provider:    Patient requests to be advised

## 2022-02-27 DIAGNOSIS — F332 Major depressive disorder, recurrent severe without psychotic features: Secondary | ICD-10-CM | POA: Diagnosis not present

## 2022-02-27 NOTE — Telephone Encounter (Signed)
Please advise 

## 2022-02-28 DIAGNOSIS — F332 Major depressive disorder, recurrent severe without psychotic features: Secondary | ICD-10-CM | POA: Diagnosis not present

## 2022-02-28 NOTE — Telephone Encounter (Signed)
Left message to return call to our office at their convenience.  

## 2022-02-28 NOTE — Telephone Encounter (Signed)
Please clarify with patient. If she is still having issues then recommend allergist referral.  Katina Degree. [name], MD 02/28/2022 7:22 AM

## 2022-03-01 ENCOUNTER — Other Ambulatory Visit: Payer: Self-pay | Admitting: *Deleted

## 2022-03-01 DIAGNOSIS — H938X9 Other specified disorders of ear, unspecified ear: Secondary | ICD-10-CM

## 2022-03-01 NOTE — Telephone Encounter (Signed)
ENT referral placed.

## 2022-03-01 NOTE — Telephone Encounter (Signed)
See note

## 2022-03-01 NOTE — Telephone Encounter (Signed)
Patient was returning Stella's call. Informed pt of message below.   Patient states: - She is still having issues with runny nose  - Went to [facility] who did a saline test; found partially clogged tear duct -Informed that fixing the tear duct wouldn't help

## 2022-03-01 NOTE — Telephone Encounter (Signed)
Ok with referral to ENT.  Deborah Soto. [name], MD 03/01/2022 10:18 AM

## 2022-03-07 DIAGNOSIS — F332 Major depressive disorder, recurrent severe without psychotic features: Secondary | ICD-10-CM | POA: Diagnosis not present

## 2022-03-10 ENCOUNTER — Other Ambulatory Visit: Payer: Self-pay | Admitting: Psychiatry

## 2022-03-10 DIAGNOSIS — F339 Major depressive disorder, recurrent, unspecified: Secondary | ICD-10-CM

## 2022-03-10 MED ORDER — AUVELITY 45-105 MG PO TBCR: 1.0000 | EXTENDED_RELEASE_TABLET | Freq: Two times a day (BID) | ORAL | 3 refills | Status: DC

## 2022-03-13 ENCOUNTER — Telehealth: Payer: Self-pay | Admitting: Family Medicine

## 2022-03-13 DIAGNOSIS — F332 Major depressive disorder, recurrent severe without psychotic features: Secondary | ICD-10-CM | POA: Diagnosis not present

## 2022-03-13 NOTE — Telephone Encounter (Signed)
Pt states is we can send a referral to a different ENT. The one we sent the referral to does not have any appts till the end of February. Please advise.

## 2022-03-14 DIAGNOSIS — F332 Major depressive disorder, recurrent severe without psychotic features: Secondary | ICD-10-CM | POA: Diagnosis not present

## 2022-03-16 NOTE — Telephone Encounter (Signed)
Patient was able to get in sooner

## 2022-03-16 NOTE — Telephone Encounter (Signed)
This is the quickest the patient is likely to be seen at any ENT office at this time, as all specialty offices are booking far out. Deborah Soto can you also speak with the patient and please advise or check to see if she wants to be seen by another provider outside of [facility] network?

## 2022-03-19 ENCOUNTER — Telehealth: Payer: Self-pay

## 2022-03-19 NOTE — Telephone Encounter (Signed)
Prior Authorization submitted for Auvelity 45-105 mg with [employer], pending response

## 2022-03-19 NOTE — Telephone Encounter (Signed)
Approval received effective 0/10/2022-03/18/2023 for Auvelity 45-105 mg with [employer]

## 2022-03-20 DIAGNOSIS — F332 Major depressive disorder, recurrent severe without psychotic features: Secondary | ICD-10-CM | POA: Diagnosis not present

## 2022-03-21 ENCOUNTER — Other Ambulatory Visit: Payer: Self-pay | Admitting: Psychiatry

## 2022-03-21 ENCOUNTER — Other Ambulatory Visit: Payer: Self-pay | Admitting: Family Medicine

## 2022-03-21 DIAGNOSIS — F332 Major depressive disorder, recurrent severe without psychotic features: Secondary | ICD-10-CM

## 2022-03-22 ENCOUNTER — Encounter: Payer: Self-pay | Admitting: Family Medicine

## 2022-03-22 ENCOUNTER — Ambulatory Visit: Payer: BC Managed Care – PPO | Admitting: Family Medicine

## 2022-03-22 VITALS — BP 106/73 | HR 86 | Temp 98.0°F | Ht 68.0 in | Wt 209.0 lb

## 2022-03-22 DIAGNOSIS — R52 Pain, unspecified: Secondary | ICD-10-CM | POA: Diagnosis not present

## 2022-03-22 DIAGNOSIS — G629 Polyneuropathy, unspecified: Secondary | ICD-10-CM | POA: Diagnosis not present

## 2022-03-22 DIAGNOSIS — F332 Major depressive disorder, recurrent severe without psychotic features: Secondary | ICD-10-CM | POA: Diagnosis not present

## 2022-03-22 DIAGNOSIS — J309 Allergic rhinitis, unspecified: Secondary | ICD-10-CM | POA: Diagnosis not present

## 2022-03-22 NOTE — Patient Instructions (Signed)
It was very nice to see you today!  Please increase your baclofen to 10 mg twice daily.  You can try increasing to 50 mg twice daily.  Will help your pain will get better with your increased Spravato.  Let me know how you are doing in a few weeks and we can consider referral to pain clinic for discussion for ketamine infusion or have you follow back up with sports medicine.  Take care, Dr Jerline Pain  PLEASE NOTE:  If you had any lab tests, please let us know if you have not heard back within a few days. You may see your results on mychart before we have a chance to review them but we will give you a call once they are reviewed by [name].   If we ordered any referrals today, please let us know if you have not heard from their office within the next week.   If you had any urgent prescriptions sent in today, please check with the pharmacy within an hour of our visit to make sure the prescription was transmitted appropriately.   Please try these tips to maintain a healthy lifestyle:  Eat at least 3 REAL meals and 1-2 snacks per day.  Aim for no more than 5 hours between eating.  If you eat breakfast, please do so within one hour of getting up.   Each meal should contain half fruits/vegetables, one quarter protein, and one quarter carbs (no bigger than a computer mouse)  Cut down on sweet beverages. This includes juice, soda, and sweet tea.   Drink at least 1 glass of water with each meal and aim for at least 8 glasses per day  Exercise at least 150 minutes every week.

## 2022-03-22 NOTE — Assessment & Plan Note (Signed)
No red flags.  Symptoms are still not adequately controlled though are overall manageable.  She will continue her Celebrex 200 mg twice daily.  Continue gabapentin 900 mg daily.  She will switch her baclofen to 10 mg twice daily scheduled.  She can try increasing to 50 mg twice daily if this is still not adequately controlled.  She will be increasing her dose of Spravato to twice weekly per the direction of her psychiatrist which will hopefully help some with her chronic pain.  She may be interested in ketamine infusions.  Recommend she discuss with her psychiatrist.  She will follow-up me in a few weeks.  Would consider referral to PT or sports medicine if still not improving.

## 2022-03-22 NOTE — Progress Notes (Signed)
   Deborah Soto is a 57 y.o. female who presents today for an office visit.  Assessment/Plan:  Chronic Problems Addressed Today: Diffuse pain No red flags.  Symptoms are still not adequately controlled though are overall manageable.  She will continue her Celebrex 200 mg twice daily.  Continue gabapentin 900 mg daily.  She will switch her baclofen to 10 mg twice daily scheduled.  She can try increasing to 50 mg twice daily if this is still not adequately controlled.  She will be increasing her dose of Spravato to twice weekly per the direction of her psychiatrist which will hopefully help some with her chronic pain.  She may be interested in ketamine infusions.  Recommend she discuss with her psychiatrist.  She will follow-up me in a few weeks.  Would consider referral to PT or sports medicine if still not improving.  AR (allergic rhinitis) Has not had much success with over-the-counter medications or Astelin.  She has referral to ENT pending.     Subjective:  HPI:  Patient here today for follow up.  Last saw her about a month ago for diffuse pain.  See A/P for status of chronic conditions.  At her last visit we increased her baclofen to 10 mg 3 times daily as needed.  We also increased her gabapentin to 900 mg twice daily. She is not sure if this has helped. She has otherwise been doing reasonably well.  Pain seems to be worsening a little bit since last time we saw her.  She did occasionally try taking a higher dose of the baclofen which made her too sleepy.  She has been following with psychiatry and will be going back on Spravato twice weekly starting this week.  She is hopeful this will help some with her pain.  She has not had any other medication adjustments since her last visit.       Objective:  Physical Exam: BP 106/73   Pulse 86   Temp 98 F (36.7 C) (Temporal)   Ht 5\' 8"  (1.727 m)   Wt 209 lb (94.8 kg)   LMP  (LMP Unknown) Comment: last period ---  1 year ago (?)   SpO2 98%   BMI 31.78 kg/m   Gen: No acute distress, resting comfortably CV: Regular rate and rhythm with no murmurs appreciated Pulm: Normal work of breathing, clear to auscultation bilaterally with no crackles, wheezes, or rhonchi Neuro: Grossly normal, moves all extremities Psych: Normal affect and thought content      Diangelo Radel M. Jerline Pain, MD 03/22/2022 10:37 AM

## 2022-03-22 NOTE — Assessment & Plan Note (Signed)
Has not had much success with over-the-counter medications or Astelin.  She has referral to ENT pending.

## 2022-03-27 DIAGNOSIS — F332 Major depressive disorder, recurrent severe without psychotic features: Secondary | ICD-10-CM | POA: Diagnosis not present

## 2022-03-29 DIAGNOSIS — F332 Major depressive disorder, recurrent severe without psychotic features: Secondary | ICD-10-CM | POA: Diagnosis not present

## 2022-04-03 DIAGNOSIS — F332 Major depressive disorder, recurrent severe without psychotic features: Secondary | ICD-10-CM | POA: Diagnosis not present

## 2022-04-05 DIAGNOSIS — J300 Vasomotor rhinitis: Secondary | ICD-10-CM | POA: Diagnosis not present

## 2022-04-05 DIAGNOSIS — Z7901 Long term (current) use of anticoagulants: Secondary | ICD-10-CM | POA: Diagnosis not present

## 2022-04-05 DIAGNOSIS — H04203 Unspecified epiphora, bilateral lacrimal glands: Secondary | ICD-10-CM | POA: Diagnosis not present

## 2022-04-09 ENCOUNTER — Telehealth: Payer: Self-pay | Admitting: *Deleted

## 2022-04-09 ENCOUNTER — Ambulatory Visit: Payer: BC Managed Care – PPO | Admitting: Family Medicine

## 2022-04-09 NOTE — Patient Outreach (Signed)
  Care Coordination   04/09/2022 Name: Deborah Soto MRN: [id number] DOB: 1965-09-28   Care Coordination Outreach Attempts:  An unsuccessful telephone outreach was attempted today to offer the patient information about available care coordination services as a benefit of their health plan.   Follow Up Plan:  Additional outreach attempts will be made to offer the patient care coordination information and services.   Encounter Outcome:  No Answer   Care Coordination Interventions:  No, not indicated    Raina Mina, RN Care Management Coordinator [place] Office [phone]

## 2022-04-10 DIAGNOSIS — F332 Major depressive disorder, recurrent severe without psychotic features: Secondary | ICD-10-CM | POA: Diagnosis not present

## 2022-04-11 ENCOUNTER — Encounter: Payer: Self-pay | Admitting: Family Medicine

## 2022-04-11 ENCOUNTER — Ambulatory Visit: Payer: BC Managed Care – PPO | Admitting: Family Medicine

## 2022-04-11 VITALS — BP 105/68 | HR 86 | Temp 96.9°F | Ht 68.0 in | Wt 210.6 lb

## 2022-04-11 DIAGNOSIS — E669 Obesity, unspecified: Secondary | ICD-10-CM | POA: Diagnosis not present

## 2022-04-11 DIAGNOSIS — R52 Pain, unspecified: Secondary | ICD-10-CM

## 2022-04-11 DIAGNOSIS — J309 Allergic rhinitis, unspecified: Secondary | ICD-10-CM | POA: Diagnosis not present

## 2022-04-11 DIAGNOSIS — E1169 Type 2 diabetes mellitus with other specified complication: Secondary | ICD-10-CM | POA: Diagnosis not present

## 2022-04-11 LAB — COMPREHENSIVE METABOLIC PANEL
ALT: 36 U/L — ABNORMAL HIGH (ref 0–35)
AST: 30 U/L (ref 0–37)
Albumin: 4.9 g/dL (ref 3.5–5.2)
Alkaline Phosphatase: 55 U/L (ref 39–117)
BUN: 14 mg/dL (ref 6–23)
CO2: 24 mEq/L (ref 19–32)
Calcium: 9.9 mg/dL (ref 8.4–10.5)
Chloride: 107 mEq/L (ref 96–112)
Creatinine, Ser: 0.7 mg/dL (ref 0.40–1.20)
GFR: 98.08 mL/min (ref >60.00)
Glucose, Bld: 126 mg/dL — ABNORMAL HIGH (ref 70–99)
Potassium: 4.5 mEq/L (ref 3.5–5.1)
Sodium: 141 mEq/L (ref 135–145)
Total Bilirubin: 0.5 mg/dL (ref 0.2–1.2)
Total Protein: 7 g/dL (ref 6.0–8.3)

## 2022-04-11 LAB — CBC
HCT: 45.2 % (ref 36.0–46.0)
Hemoglobin: 15 g/dL (ref 12.0–15.0)
MCHC: 33.1 g/dL (ref 30.0–36.0)
MCV: 90.1 fl (ref 78.0–100.0)
Platelets: 280 10*3/uL (ref 150.0–400.0)
RBC: 5.01 Mil/uL (ref 3.87–5.11)
RDW: 14.5 % (ref 11.5–15.5)
WBC: 8.6 10*3/uL (ref 4.0–10.5)

## 2022-04-11 LAB — CK: Total CK: 32 U/L (ref 7–177)

## 2022-04-11 LAB — C-REACTIVE PROTEIN: CRP: <1 mg/dL (ref 0.5–20.0)

## 2022-04-11 LAB — SEDIMENTATION RATE: Sed Rate: 9 mm/hr (ref 0–30)

## 2022-04-11 NOTE — Patient Instructions (Signed)
It was very nice to see you today!  We will check blood work today.  We may have you follow up with Dr Georgina Snell depending on the results.   We will see you back to check your blood sugar soon.   Take care, Dr Jerline Pain  PLEASE NOTE:  If you had any lab tests, please let us know if you have not heard back within a few days. You may see your results on mychart before we have a chance to review them but we will give you a call once they are reviewed by [name].   If we ordered any referrals today, please let us know if you have not heard from their office within the next week.   If you had any urgent prescriptions sent in today, please check with the pharmacy within an hour of our visit to make sure the prescription was transmitted appropriately.   Please try these tips to maintain a healthy lifestyle:  Eat at least 3 REAL meals and 1-2 snacks per day.  Aim for no more than 5 hours between eating.  If you eat breakfast, please do so within one hour of getting up.   Each meal should contain half fruits/vegetables, one quarter protein, and one quarter carbs (no bigger than a computer mouse)  Cut down on sweet beverages. This includes juice, soda, and sweet tea.   Drink at least 1 glass of water with each meal and aim for at least 8 glasses per day  Exercise at least 150 minutes every week.

## 2022-04-11 NOTE — Assessment & Plan Note (Signed)
Following with ENT.  Recently started on Atrovent.  She has not noticed much improvement as of yet.

## 2022-04-11 NOTE — Assessment & Plan Note (Signed)
Last A1c was at goal.  She is up a couple pounds since her last visit.  She has been compliant with Mounjaro 15 mg weekly and Synjardy 12 point 07-998 twice daily.  She was interested in potentially switching medications today due to increasing blood sugars however she will come back soon and we will check A1c at that time.  We did discuss that it is possible that her worsening pain the last month or so could have increased her endogenous cortisol levels which could explain some of her higher readings.  Will be treating her pain as above which should have helped some with her blood sugar readings.

## 2022-04-11 NOTE — Progress Notes (Signed)
Deborah Soto is a 57 y.o. female who presents today for an office visit.  Assessment/Plan:  Chronic Problems Addressed Today: Diffuse pain Symptoms have still persisted despite regimen of Celebrex, gabapentin, and baclofen.  She has had some rheumatologic workup in the past that was negative however this was several years ago.  She has also had some issues withSpondylosis and nerve impingement which is contributing to her pain in the past however she does not feel like this is currently contributing.  We will again recheck labs today.  If pain persist will need to have her follow back up with sports medicine.  Diabetes mellitus type 2 in obese (HCC) Last A1c was at goal.  She is up a couple pounds since her last visit.  She has been compliant with Mounjaro 15 mg weekly and Synjardy 12 point 07-998 twice daily.  She was interested in potentially switching medications today due to increasing blood sugars however she will come back soon and we will check A1c at that time.  We did discuss that it is possible that her worsening pain the last month or so could have increased her endogenous cortisol levels which could explain some of her higher readings.  Will be treating her pain as above which should have helped some with her blood sugar readings.  AR (allergic rhinitis) Following with ENT.  Recently started on Atrovent.  She has not noticed much improvement as of yet.     Subjective:  HPI:  See A/p for status of chronic conditions.  She is here today for follow up. We last saw her a few weeks ago. Since our last visit, she has had continued issues with diffuse pain.  At her last visit we had continued her on Celebrex 200 mg twice daily, gabapentin 900 mg daily and increased her baclofen to 10 mg twice daily.  She is also increased her dose of Spravato to twice weekly however pain has persisted.  Located throughout all of her body.  She has had a longstanding issue with diffuse pain and has  had issues with nerve impingement in the past though she feels like symptoms are more consistent with muscle pain and strain.  Worse throughout the day.  No obvious injuries or other precipitating events.  She has also had increased sugars for the last month or so.  She attributes this to a nutritional supplement that she took at [employer].  She took a dose of this and noticed that her sugars have been elevated since then.  Fasting sugars have been elevated in the 170s.  She has not had too many sugars above 200.  She has not had any change in medications.       Objective:  Physical Exam: BP 105/68   Pulse 86   Temp (!) 96.9 F (36.1 C) (Temporal)   Ht 5\' 8"  (1.727 m)   Wt 210 lb 9.6 oz (95.5 kg)   LMP  (LMP Unknown) Comment: last period ---  1 year ago (?)  SpO2 98%   BMI 32.02 kg/m   Wt Readings from Last 3 Encounters:  04/11/22 210 lb 9.6 oz (95.5 kg)  03/22/22 209 lb (94.8 kg)  02/09/22 206 lb 9.6 oz (93.7 kg)    Gen: No acute distress, resting comfortably CV: Regular rate and rhythm with no murmurs appreciated Pulm: Normal work of breathing, clear to auscultation bilaterally with no crackles, wheezes, or rhonchi Neuro: Grossly normal, moves all extremities Psych: Normal affect and thought content  Algis Greenhouse. Jerline Pain, MD 04/11/2022 10:54 AM

## 2022-04-11 NOTE — Assessment & Plan Note (Signed)
Symptoms have still persisted despite regimen of Celebrex, gabapentin, and baclofen.  She has had some rheumatologic workup in the past that was negative however this was several years ago.  She has also had some issues withSpondylosis and nerve impingement which is contributing to her pain in the past however she does not feel like this is currently contributing.  We will again recheck labs today.  If pain persist will need to have her follow back up with sports medicine.

## 2022-04-12 ENCOUNTER — Other Ambulatory Visit: Payer: Self-pay | Admitting: *Deleted

## 2022-04-13 LAB — ANA: Anti Nuclear Antibody (ANA): POSITIVE — AB

## 2022-04-13 LAB — ANTI-NUCLEAR AB-TITER (ANA TITER): ANA Titer 1: 1:320 {titer} — ABNORMAL HIGH

## 2022-04-13 LAB — RHEUMATOID FACTOR: Rheumatoid fact SerPl-aCnc: <14 IU/mL (ref <14)

## 2022-04-13 NOTE — Progress Notes (Signed)
Please inform patient of the following:  Her ANA is up quite a bit compared to last few times that we checked.  Her rheumatoid factor was negative and her inflammatory markers were negative.  With her elevated ANA it would probably make sense for her to get checked out by rheumatology again to further look into possible autoimmune causes of her pain such as lupus.  Please place referral if she is agreeable.

## 2022-04-16 ENCOUNTER — Other Ambulatory Visit: Payer: Self-pay

## 2022-04-16 ENCOUNTER — Telehealth: Payer: Self-pay | Admitting: Family Medicine

## 2022-04-16 ENCOUNTER — Ambulatory Visit: Payer: BC Managed Care – PPO | Admitting: Family Medicine

## 2022-04-16 ENCOUNTER — Other Ambulatory Visit: Payer: Self-pay | Admitting: *Deleted

## 2022-04-16 DIAGNOSIS — R768 Other specified abnormal immunological findings in serum: Secondary | ICD-10-CM

## 2022-04-16 NOTE — Telephone Encounter (Signed)
Referral placed.

## 2022-04-16 NOTE — Telephone Encounter (Signed)
Patient states being referred to a Rheumatologist was discussed at Office Visit on 04/11/22.  Patient states she does want a Referral to Rheumatology.

## 2022-04-17 DIAGNOSIS — F332 Major depressive disorder, recurrent severe without psychotic features: Secondary | ICD-10-CM | POA: Diagnosis not present

## 2022-04-18 ENCOUNTER — Ambulatory Visit: Payer: BC Managed Care – PPO | Admitting: Family Medicine

## 2022-04-18 ENCOUNTER — Encounter: Payer: Self-pay | Admitting: Family Medicine

## 2022-04-18 VITALS — BP 109/72 | HR 86 | Temp 97.8°F | Ht 68.0 in | Wt 213.0 lb

## 2022-04-18 DIAGNOSIS — R768 Other specified abnormal immunological findings in serum: Secondary | ICD-10-CM

## 2022-04-18 DIAGNOSIS — R52 Pain, unspecified: Secondary | ICD-10-CM

## 2022-04-18 DIAGNOSIS — E1169 Type 2 diabetes mellitus with other specified complication: Secondary | ICD-10-CM

## 2022-04-18 DIAGNOSIS — F332 Major depressive disorder, recurrent severe without psychotic features: Secondary | ICD-10-CM | POA: Diagnosis not present

## 2022-04-18 DIAGNOSIS — E669 Obesity, unspecified: Secondary | ICD-10-CM | POA: Diagnosis not present

## 2022-04-18 LAB — POCT GLYCOSYLATED HEMOGLOBIN (HGB A1C): Hemoglobin A1C: 5.8 % — AB (ref 4.0–5.6)

## 2022-04-18 LAB — MICROALBUMIN / CREATININE URINE RATIO
Creatinine,U: 72.3 mg/dL
Microalb Creat Ratio: 1 mg/g (ref 0.0–30.0)
Microalb, Ur: <0.7 mg/dL (ref 0.0–1.9)

## 2022-04-18 NOTE — Assessment & Plan Note (Addendum)
Had lengthy discussion with patient regarding her most recent lab results that showed elevated ANA at 1:320.  Pain has been persistent since her last visit.  She has referral to rheumatology pending.  Will continue her current regimen of Celebrex and gabapentin.  She has no longer taking the baclofen.  We did discuss potentially switching over to Lyrica though we will await rheumatologic workup first.  She has occasionally been taking Norco for severe flares of pain.  She had some leftover prescription from a couple of years ago.  Does not need any refills today.

## 2022-04-18 NOTE — Assessment & Plan Note (Signed)
A1c stable 5.8.  Will continue current regimen Mounjaro 15 mg weekly and Synjardy 12.07-998 twice daily.  She will come back in 3 months and we can recheck A1c at that time.

## 2022-04-18 NOTE — Progress Notes (Signed)
   Deborah Soto is a 57 y.o. female who presents today for an office visit.  Assessment/Plan:  Chronic Problems Addressed Today: Diffuse pain Had lengthy discussion with patient regarding her most recent lab results that showed elevated ANA at 1:320.  Pain has been persistent since her last visit.  She has referral to rheumatology pending.  Will continue her current regimen of Celebrex and gabapentin.  She has no longer taking the baclofen.  We did discuss potentially switching over to Lyrica though we will await rheumatologic workup first.  She has occasionally been taking Norco for severe flares of pain.  She had some leftover prescription from a couple of years ago.  Does not need any refills today.  Diabetes mellitus type 2 in obese (HCC) A1c stable 5.8.  Will continue current regimen Mounjaro 15 mg weekly and Synjardy 12.07-998 twice daily.  She will come back in 3 months and we can recheck A1c at that time.     Subjective:  HPI:  See A/p for status of chronic conditions. She was last here a week ago for diffuse pain. We checked labs at that time which showed elevated ANA 1:320. Her pain is about the same as our last visit. She has been referred to see a rheumatologist but has not yet heard back from.    Plan has been about the same since her last visit.       Objective:  Physical Exam: BP 109/72   Pulse 86   Temp 97.8 F (36.6 C) (Temporal)   Ht 5\' 8"  (1.727 m)   Wt 213 lb (96.6 kg)   LMP  (LMP Unknown) Comment: last period ---  1 year ago (?)  SpO2 97%   BMI 32.39 kg/m   Gen: No acute distress, resting comfortably Neuro: Grossly normal, moves all extremities Psych: Normal affect and thought content      Kevontae Burgoon M. Jerline Pain, MD 04/18/2022 9:54 AM

## 2022-04-18 NOTE — Patient Instructions (Addendum)
It was very nice to see you today!  Your A1c is 5.8. We will continue your current medications.  Please let me know if you have trouble establishing with a rheumatologist.  Will see back in 3 months recheck your A1c.  Please come back to see [name] sooner if needed.  Take care, Dr Jerline Pain  PLEASE NOTE:  If you had any lab tests, please let us know if you have not heard back within a few days. You may see your results on mychart before we have a chance to review them but we will give you a call once they are reviewed by [name].   If we ordered any referrals today, please let us know if you have not heard from their office within the next week.   If you had any urgent prescriptions sent in today, please check with the pharmacy within an hour of our visit to make sure the prescription was transmitted appropriately.   Please try these tips to maintain a healthy lifestyle:  Eat at least 3 REAL meals and 1-2 snacks per day.  Aim for no more than 5 hours between eating.  If you eat breakfast, please do so within one hour of getting up.   Each meal should contain half fruits/vegetables, one quarter protein, and one quarter carbs (no bigger than a computer mouse)  Cut down on sweet beverages. This includes juice, soda, and sweet tea.   Drink at least 1 glass of water with each meal and aim for at least 8 glasses per day  Exercise at least 150 minutes every week.

## 2022-04-19 NOTE — Progress Notes (Signed)
Please inform patient of the following:  Urine test is normal. We can recheck in a year.  Deborah Soto. Jerline Pain, MD 04/19/2022 3:19 PM

## 2022-04-20 ENCOUNTER — Other Ambulatory Visit: Payer: Self-pay | Admitting: Psychiatry

## 2022-04-20 DIAGNOSIS — F411 Generalized anxiety disorder: Secondary | ICD-10-CM

## 2022-04-20 DIAGNOSIS — F332 Major depressive disorder, recurrent severe without psychotic features: Secondary | ICD-10-CM

## 2022-04-24 DIAGNOSIS — F332 Major depressive disorder, recurrent severe without psychotic features: Secondary | ICD-10-CM | POA: Diagnosis not present

## 2022-04-26 ENCOUNTER — Ambulatory Visit: Payer: BC Managed Care – PPO | Admitting: Family Medicine

## 2022-04-26 ENCOUNTER — Encounter: Payer: Self-pay | Admitting: Family Medicine

## 2022-04-26 VITALS — BP 116/76 | HR 84 | Temp 97.3°F | Ht 68.0 in | Wt 214.6 lb

## 2022-04-26 DIAGNOSIS — R52 Pain, unspecified: Secondary | ICD-10-CM

## 2022-04-26 DIAGNOSIS — F332 Major depressive disorder, recurrent severe without psychotic features: Secondary | ICD-10-CM

## 2022-04-26 MED ORDER — PREGABALIN 150 MG PO CAPS: 150.0000 mg | ORAL_CAPSULE | Freq: Two times a day (BID) | ORAL | 5 refills | Status: DC

## 2022-04-26 NOTE — Assessment & Plan Note (Addendum)
Her pain is still not controlled.  We discussed management options.  She would like to avoid narcotics as much as possible at this point.  We will switch her gabapentin 930m bid to Lyrica 1555mbid.  She can continue taking her Celebrex for now.  She does have upcoming appoint with rheumatology for further evaluation for possible underlying rheumatologic or autoimmune disorder.  She will follow-up me in a week or so to limit how she is doing.  Would consider adding on amitriptyline depending on response though will need to make sure this is ok with her other psychiatric medications.

## 2022-04-26 NOTE — Assessment & Plan Note (Addendum)
Will continue management per psychiatry.  Would consider potentially adding on low-dose of amitriptyline in the future for potentially underlying fibromyalgia and pain control as above though will need to be careful with interactions with her current psychiatric medications.

## 2022-04-26 NOTE — Patient Instructions (Signed)
It was very nice to see you today!  We will switch your gabapentin to Lyrica.  Please send me a message in a week or so to let me know how this is working for you.  We would consider adding on amitriptyline to help with the pain if needed.   Take care, Dr Jerline Pain  PLEASE NOTE:  If you had any lab tests, please let us know if you have not heard back within a few days. You may see your results on mychart before we have a chance to review them but we will give you a call once they are reviewed by [name].   If we ordered any referrals today, please let us know if you have not heard from their office within the next week.   If you had any urgent prescriptions sent in today, please check with the pharmacy within an hour of our visit to make sure the prescription was transmitted appropriately.   Please try these tips to maintain a healthy lifestyle:  Eat at least 3 REAL meals and 1-2 snacks per day.  Aim for no more than 5 hours between eating.  If you eat breakfast, please do so within one hour of getting up.   Each meal should contain half fruits/vegetables, one quarter protein, and one quarter carbs (no bigger than a computer mouse)  Cut down on sweet beverages. This includes juice, soda, and sweet tea.   Drink at least 1 glass of water with each meal and aim for at least 8 glasses per day  Exercise at least 150 minutes every week.

## 2022-04-26 NOTE — Progress Notes (Addendum)
   Deborah Soto is a 57 y.o. female who presents today for an office visit.  Assessment/Plan:  Chronic Problems Addressed Today: Diffuse pain Her pain is still not controlled.  We discussed management options.  She would like to avoid narcotics as much as possible at this point.  We will switch her gabapentin 900mg  bid to Lyrica 150mg  bid.  She can continue taking her Celebrex for now.  She does have upcoming appoint with rheumatology for further evaluation for possible underlying rheumatologic or autoimmune disorder.  She will follow-up me in a week or so to limit how she is doing.  Would consider adding on amitriptyline depending on response though will need to make sure this is ok with her other psychiatric medications.  MDD (major depressive disorder), recurrent severe, without psychosis ([place]) Will continue management per psychiatry.  Would consider potentially adding on low-dose of amitriptyline in the future for potentially underlying fibromyalgia and pain control as above though will need to be careful with interactions with her current psychiatric medications.     Subjective:  HPI:  See A/p for status of chronic conditions. We last saw her about a week ago.  At her last visit we had discussed her pain and recent labs that showed elevated ANA at 1-320.  She does have dermatology referral pending and hopefully will be able to see them within a few weeks.  Pain is still persistent and severe.  Located throughout arms and legs though more predominant has been having more issues with her torso.  Symptoms do seem to be worse at night.  She is currently on Celebrex, and gabapentin.  We previously had her on baclofen however this has not been effective.  She is interested in possibly switching over to Lyrica.  She thinks this may have been more effective in the past.  No nausea or vomiting.  No constipation or diarrhea.        Objective:  Physical Exam: BP 116/76   Pulse 84   Temp (!)  97.3 F (36.3 C) (Temporal)   Ht 5\' 8"  (1.727 m)   Wt 214 lb 9.6 oz (97.3 kg)   LMP  (LMP Unknown) Comment: last period ---  1 year ago (?)  SpO2 99%   BMI 32.63 kg/m   Gen: No acute distress, resting comfortably CV: Regular rate and rhythm with no murmurs appreciated Pulm: Normal work of breathing, clear to auscultation bilaterally with no crackles, wheezes, or rhonchi Neuro: Grossly normal, moves all extremities Psych: Normal affect and thought content      Deborah Soto M. Jerline Pain, MD 04/26/2022 12:19 PM

## 2022-05-01 DIAGNOSIS — F332 Major depressive disorder, recurrent severe without psychotic features: Secondary | ICD-10-CM | POA: Diagnosis not present

## 2022-05-03 DIAGNOSIS — F332 Major depressive disorder, recurrent severe without psychotic features: Secondary | ICD-10-CM | POA: Diagnosis not present

## 2022-05-08 ENCOUNTER — Other Ambulatory Visit (HOSPITAL_COMMUNITY): Payer: Self-pay

## 2022-05-08 ENCOUNTER — Other Ambulatory Visit: Payer: Self-pay

## 2022-05-08 ENCOUNTER — Telehealth: Payer: Self-pay | Admitting: Family Medicine

## 2022-05-08 DIAGNOSIS — F332 Major depressive disorder, recurrent severe without psychotic features: Secondary | ICD-10-CM | POA: Diagnosis not present

## 2022-05-08 MED ORDER — MOUNJARO 15 MG/0.5ML ~~LOC~~ SOAJ
15.0000 mg | SUBCUTANEOUS | 11 refills | Status: DC
  Filled 2022-05-08: qty 2, 28d supply, fill #0
  Filled 2022-06-01: qty 2, 28d supply, fill #1

## 2022-05-08 NOTE — Telephone Encounter (Signed)
Pt sts  MOUNJARO 15 MG/0.5ML Pen  Is on backorder and would like to know if there is anything else to use instead. Please advise.

## 2022-05-08 NOTE — Telephone Encounter (Signed)
Med sent to [facility] outpatient

## 2022-05-10 DIAGNOSIS — F332 Major depressive disorder, recurrent severe without psychotic features: Secondary | ICD-10-CM | POA: Diagnosis not present

## 2022-05-14 DIAGNOSIS — F332 Major depressive disorder, recurrent severe without psychotic features: Secondary | ICD-10-CM | POA: Diagnosis not present

## 2022-05-16 ENCOUNTER — Encounter: Payer: Self-pay | Admitting: Psychiatry

## 2022-05-16 ENCOUNTER — Ambulatory Visit: Payer: BC Managed Care – PPO | Admitting: Psychiatry

## 2022-05-16 DIAGNOSIS — G4733 Obstructive sleep apnea (adult) (pediatric): Secondary | ICD-10-CM | POA: Diagnosis not present

## 2022-05-16 DIAGNOSIS — G4710 Hypersomnia, unspecified: Secondary | ICD-10-CM | POA: Diagnosis not present

## 2022-05-16 DIAGNOSIS — G251 Drug-induced tremor: Secondary | ICD-10-CM

## 2022-05-16 DIAGNOSIS — R7989 Other specified abnormal findings of blood chemistry: Secondary | ICD-10-CM

## 2022-05-16 DIAGNOSIS — Z79899 Other long term (current) drug therapy: Secondary | ICD-10-CM

## 2022-05-16 DIAGNOSIS — F411 Generalized anxiety disorder: Secondary | ICD-10-CM | POA: Diagnosis not present

## 2022-05-16 DIAGNOSIS — F339 Major depressive disorder, recurrent, unspecified: Secondary | ICD-10-CM | POA: Diagnosis not present

## 2022-05-16 DIAGNOSIS — R52 Pain, unspecified: Secondary | ICD-10-CM

## 2022-05-16 NOTE — Progress Notes (Signed)
Deborah Soto [id number] 12-20-1965 57 y.o.     Subjective:   Patient ID:  Deborah Soto is a 57 y.o. (DOB [birth year]) female.  Chief Complaint:  Chief Complaint  Patient presents with   Follow-up   Depression   Anxiety   Depression        Associated symptoms include fatigue and myalgias.  Associated symptoms include no decreased concentration.   Deborah Soto presents to the office today for follow-up of TRD and anxiety, and insomnia.  seen December 08, 2018.  She was complaining of lithium tremor and some cognitive problems.  Hydroxyzine was stopped.  Propranolol increased to 60 mg twice daily for tremor.  Lithium level and amitriptyline levels were requested. Amitriptyline level to 37, nortriptyline 49 on amitriptyline 250 mg nightly. Lithium level 0.800 mg daily  Phone call on October 6 to discuss lab results as follows: Note   ----- Message from [name]., MD sent at 12/15/2018 10:08 AM EDT ----- Lithium level stable at 0.8 in a good range.  The previous was 0.7.  She is having some tremor issues.   Normal BMP including excellent creatinine and calcium levels.  Blood sugar is high but she is aware of that problem.   TSH is within normal limits.   Serum amitriptyline level is 286 which is at the upper end of the normal range and suggestive that we not try further increase.  We will discuss further options at her follow-up appointment.   No medication adjustments required, but because of her tremor and her lithium level is slightly higher than it was with the prior level if she wants to reduce the lithium from 3 of the 300 mg tablets daily to 2-1/2 of the 300 mg tablets daily she can do so.  At her last appointment she was also encouraged to try a higher dose of propranolol and that may have resolved the problem.  If it did do not reduce the lithium but if it did not and she wants to reduce the dose she can do so as noted.  She should call us if she has any  recurrence of symptoms.   Deborah Parents MD, [name]     She had repeat lithium level at 750 mg daily of 0.  7 with some improvement in tremor.  In phone call on November 20 she reported mood was worse with weepiness, overreacting, "pity party".  Because mood symptoms were worse with the reduction lithium she was encouraged to increase the dosage back to 900 mg daily.   Last seen February 04, 2019.  The following changes were made: Increase propranolol to 80 mg twice daily for lithium tremor and anxiety her blood pressure and pulse appear high enough that she can tolerate this dosage.  Disc Se.  Disc risk with low BP and DM.  He has not been having any problems with low blood sugar. Amitriptyline 6 or 300 mg daily if tolerated.  Try to take some about 4 -5 hours before sleep Try to take lithium also 4-5 hours before sleep to minimize daytime tremor Add methylphenidate ER 27 mg in the morning.  If no response we will increase the dosage.  Seen May 08, 2019 with her husband. Aside from physically feeling like she can't do much then having memory issues.  Forgetful.  Loses track of thoughts between tasks.  Most embarrassing being around people and word-finding issues.   Problems with walking bc balance and weakness.  Fears falling. Only fall at  Xmas morning.  Tremor is awful.  Affects keying.   H notices cognitive problems and forgetfulness.    Shaking got really bad and got lithium level as low as 600 mg but the next day felt really helpless and everything seemed to be aimed at hurting me and felt disrespected.  Problems with boss and 57 yo taking 5 AP classes and 2 volleyball classes.  I've been on her to accomplish all these things.  She claimed that pt screaming at her.   H CO she was more irritable after the reduction in lithium.   H CO her anxiety also seems worse.  H says the last year has been unhealthy with 12 hour work days for her.   Doesn't go out much.    Attention problems more  noticeable.  Still dropping and tremor.  Tolerated the increase in propranolol.    CO shakiness from lithium which interferes with typing. Also a good bit of anxiety generally without panic.  BS are high.  NotTaking hydroxyzine.   sleeping better and still using CPAP.  Using CPAP regularly. Less awakening than before.  No SE. depression was worse after reducing the lithium.  Irritability was also worse after reducing the lithium to 55 mg daily.  seen May 08, 2019.  Multiple changes made:  Reduce propranolol to 60 mg twice daily for lithium tremor and anxiety since the increase was not helpful.   Restart B6 for lithium tremor. 500 mg BID. She forgot and doesn't want more. Try to take lithium also 4-5 hours before sleep to minimize daytime tremor. depression was worse after reducing the lithium.  Irritability was also worse after reducing the lithium to 55 mg daily. So reluctant to reduce it further.  It is unclear how to address the benefits of lithium without using lithium other than to consider Spravato  Increase methylphenidate ER 36 mg in the morning. Increase methylphenidate ER 36 mg in the morning.  Wait 1-2 weeks then reduce amitriptyline to 5 tablets nightly to see if cognition is better. Also saw Dr. [name] re: sleep disorder.  05/2019 appt with the following noted: Lost Rx of Concerta 36 so didn't take it long.  Out of it.  Still shakey and cognitive problems.  Dep 5/10/  Anxiety 6/10 worse at night.  No SI.  8 hours sleep.  Mild panic couple times daily. Frustrated with tremor and sleepiness which is worse than tiredness.   History of low vitamin D taking 150K/week but stopped 6 mos ago. Plan : Modafinil 100 mg tablet 1 dailly for 1 week, Then add Concerta 36 mg 1 each AM.  Option increase modafinil mid April  Restart B6 for lithium tremor. 500 mg BID. Get lab test at earliest convience  10/14/2019 appointment with the following noted: Has been on amitriptyline 250 mg for  mos.  Tried a week ago reducing to 200 and felt worse so back on 250. Weaned off Lyrica about a month ago.  Not much change off it.   Not sure if it helped energy or alertness. Saw Dr. Cardell Peach in consultation. Mental clarity is a lot better with stimulants and not sleeping as much in daytime with current meds.  Severe drowsiness is a lot better.   Off metformin and added Glipizide.   Not great with depression 5/10.  Anxiety can trigger problems with thinking and somatic sx   I feel like it's something physical and can keep her up at night. Occ racing heart or feeling hot.  Caffeine variable.  Plan:Plan: Increase methylphenidate from 36 to 54 mg in the morning. Increase amitriptyline to 6 tablets in the evening    11/12/19 TC noting call: Pt called stated she's not feeling well at all. Ask for sooner apt than 10/7. Depression, anxiety & focus is much worse. Has not planned to hurt herself but thoughts of things would be better if I was not here have crossed her mind often. Contact ASAP @ [phone].  I put Pt on canc list  MD response: I reviewed the meds and previous med lists.  The next options are either Spravato or a major med change from amitriptyline which is not something I can do outside of an appt.  She's on cancellation list and we'll try to work her in sooner.  Nothing I can change right now except since the increase in amitriptyline has not helped, she can reduce it to 200 mg nightly in preparation for other changes to come. Nursing response after talking with pt: Discussed symptoms and medications with patient, she did not increase her Amitriptyline 50 mg up to 6 tablets so she will start that today. Said you may have told her that but she didn't remember. She has read about Spravato but she is not wanting to proceed with that. Informed her she's on cancellation list as well. She will call back with worsening signs or symptoms.  11/23/19 appt with the following noted: Forgot to increase  amitriptyline until noted. Tolerating it OK but hasn't had time to help. More alert with Concerta increase to 54 mg daily also with modafinil.  A little more focused and better productivity.  Still forgetful.   No SE noted except GI issues from diarrhea to constipation.   Still miserable and cry easily.  Try to numb herself.  Always tired especially emotionally.  Anxiety is high and predates current stimulants. No therapy since [facility].  She felt he pushed too hard for her to make changes.  I don't feel like I can deal with him right now. Plan: Continue meds & The increase in amitriptyline to 300 mg daily needs more time.  01/12/2020 appointment with the following noted: Not well.  Mood horrible.  Easily frustrated sometimes without reason and may nap or cry.  Anxiety is pretty bad.  Daytime sleepiness seems worse.  Can get nausea from anxiety.  Feels like function is slow.  Have to write everything down.  Hates home, job, high school volleyball program.  At some point I have to take responsibility.   Amitriptyline also not helping pain any.  Clenching jaw for 3 weeks. Likcking lips.  Picking fingers. D will play volleyball at [employer] and [place]. Plan: Reduce amitriptyline by 1 tablet every 4 days. Start Viibryd 10 mg daily with food for 7 days, then 20 mg daily for 7 days, then increase to 40 mg daily.  03/18/20 appt urgently made and noted:  Seen with husband. Still with jaw clenching day and night despite off stimulant. Now on Viibryd 60 mg since 03/02/20 and no stimulant., lithium 900 mg, and propranolol 80 BID. Rare hydroxyzine DT sleepiness, Gabapentin 300 TID. Not taking it with food.  LT sensitivity to noise and light and overstimulated and now bothering her more at work.  Always had to use shirts without tags and sensitivity to smells. Easily agitated in public per H.  Gets negative and wants to give up and not live anymore.  She says there's less chance of acting on it if she tells  H about it and she's doing so now.  Pain worse off amitriptyline esp around ribs and general diffuse pain.  Anxiety worse with stimulants and hasn't gone away.  Trouble staying asleep. Plan: Need to take Viibryd 60 with food emphasized.  She's not currently doing so. Restart low-dose amitriptyline for pain  04/22/2020 appointment with following noted: Only restarted amitriptyline 50 mg last week.  Stays asleep better with it.  Sleep 8-9 hours now but no change in pain yet.  Sleepier in day recently. Done a lot of travel lately. Maybe viibryd making a little difference with mood and anxiety now that has been on it longer.  Needs to take Viibryd in AM bc seems to interfere with sleep. Final season of travel volleyball with good team. H notes still some irritability. Pain worse off amitriptyline.  Can reduce to  amitriptyline 25 for pain if 50 mg remains too sedating.  Is sleeping better.      06/21/2020 appointment with following noted:  Seen with H Tolerating amitriptyline 50 mg HS with decent sleep. Really crappy and as bad as 10 years.  Situationally life sucks.  Work environment is bad with coworkers.  Since Sept work is bad.  Doesn't feel people are supportive at home or work. H working 12 hour days.  When she's off wants to sleep all day.   Still in therapy with Gerald Stabs.  Is good therapist. 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  Check lithium level Referred to Dr. Toy Care for [facility]  11/03/2020 appointment with the following noted: 57 yo D got Covid but is OK. Still doing Spravato 84 mg twice weekly and plans to continue for awhile. And for a total of 3 mos.  Tolerated well.  It's a nice feeling.   Didn't seem to help the depression for a couple of months.   It does feel different. SI has resolved lately which is particularly impressive given she  has a lot of free time. She has lot s of time with nothing to do bc quit her job. Job environment got worse and worse.  Had been there 9 years.   Doing PT for pain in her back.  Ongoing physical issues.   Mood clearly better with Spravato but not resolved.   Neck fusion recently. Plan: check lithium level Reduce olanzapine to 5 mg HS  12/30/2020 appointment with the following noted: Surges of anxious feelings in her chest without heart racing several times daily last 15 mins and without pcpt.  Stay a little on edge all the time.   Left job and too much time on her hands. 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  She's doing well statistically.  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 Spravato twice weekly.  Is better with it but still some mild cycling of depression.  Still upset over leaving work.  SI very rare and fleeting.  Had a really good plan but doesn't dwell on the plan. Lithium level 0.7 on 900 mg HS. Reduced olanzapine to 5 mg HS and has felt a little worse. Viibryd 60 mg daily. Amitrptyline 50 for pain.  Not sure if it helps.  PT helped a lot with tension in neck and down spine. Sleeping pretty well after 8 hours. Plan increase olanzapine back to 10 mg nightly for treatment resistant depression and anxiety.  Anxiety was worse after  reducing it and no symptoms were improved after reducing it. Try to stop amitriptyline because it is probably not needed in the presence of olanzapine. Check lithium level  03/14/2021 appointment noted: Increase olanzapine to 10 mg for anxiety spells that are unexpected and hit her in the chest esp in the morning. Very low right now.  Twice weekly Spravato and felt good for awhile.  Maybe a slump after Xmas.  Not much social interaction since she quit her job.  Xmas gave her focus.  Doesn't have this now.   Mark working 14-16 hours daily.  This won't get better. Kids are doing great but not around Sleep well generally and that is a benefit of  olanzapine. Tolerates it well.   Plan: Trial increase olanzapine to 15 mg HS  05/03/2021 appointment with the following noted: Still getting Spravato twice weekly.   I love it.  Totally relaxed Depression and anxiety SI is gone or fleeting with Spravato. Still in therapy with Hilma Favors and thinks a lot of stuff go back to childhood.  Was always criticized for normal childhood mistakes.  So coped by overcompensating with going big and grand. Can't get energy up to do anything. Never felt she had a manic periods except overstimulated CC anxious feeling in body tingling and tight uncomfortable feeling.  Reminds her of stimulant effects but less intense and worse in AM. Everything still seems to hard like going to grocery store and worry over messing up cooking. Watches too much TV For a little while increase olanzapine seemed to help.  Tolerating it. Depression is not that bad compared to the past. Plan: no med changes  07/17/21 appt noted:  seen with H Depression with twice weekly Spravato. But a lot of general anxiety and then spells of getting tingly and nervous.  Not triggers.   Using propranolol BID. Anxiety is continuation of the way it was before.  Doesn't seem to vary with time of day.  Awakens with anxiety. No SE noted except sleepy.  Sleeping at least 10-11 hours.  Not obviously worse with increase olanzapine.  Viibryd in AM bc insomnia otherwise. Balance trouble with hunched walk and she feels like legs are waker than they should be. Plan: Stop regular propranolol and start ER propranolol ER 1 at night for anxiety Reduce clonidine to 1/2 tablet twice daily Wait 1 week and reduce olanzapine to 1/2 tablet nightly to see if energy, alertness and balance are better. Starting June 1 stop clonidine.  09/05/21 appt noted: Still low energy.  No changes with less meds. Anxiety a little better last week for unclear reasons. She feels olanzapine helps sleep and doesn't want to stop it.   Sleep a lot.  11-11 if she can. Therapist working with her on childhood trauma. Long period of depression. Plan: Start Auvelity 1 daily for a week then 1 twice daily  11/27/21 appt noted:  Couple of phone calls and tremor problems and needed to reduce lithium to 1 and 1/2 tablets and it helped reduce the tremor. Maybe a little improvement.  Not so depressed but kind of numb.   Spells of anxiety but a little better.  Can feel it in chest and arms at times every other night.   Continues spravato twice weekly.   Sleep is great. Off clonidne.  Contiues Aubelity twice daily, lithium 675 mg daily, olanzapine 5 mg HS, viibryd 60 mg daily. Sleep from 11-9 and sleeps well. Plan: Trial reduction in olanzapine to 2.5 mg nightly.  02/14/22 appt noted: Noticed FM got better in summer and then relapsed.  Balance worth with FM also.   Did reduce Spravato from twice weekly to weekly in October.  Did it twice weekly for a year now.  Thinks maybe that is why FM flared.  Continues to weekly. No SI in over a years.  Better dresssed and using makeup now.  Wasn't for about 5 years.. Getting closer to feeling normal .  Depression is still good. Reduced and increased gabapentin to 900 BID. No other med changes except baclofen prn which is marginally helpful. Feels better and got motivated to buy a car.  She's more active and less depressed than she has been.  Gets showered and dress daily before leaving [place]. Less numb emotionally than when last here.  Some excitement. Asks about coming off the Auvelity to reduce meds. A1C is 5.   Sleep is better and using CPAP Plan: no med change.  Continue Auvelity 1 twice daily, continue lithium CR 450 mg tablets 2 nightly, continue olanzapine 2.5 mg nightly, continue propranolol ER 160 mg daily, continue Viibryd 60 mg daily.  05/16/22 appt noted: Meds as above.  Note she was prescribed Mounjaro. switched from gabapentin to Lyrica 150 mg twice daily,  Missed Auvelity 7 days  and felt better when restarted it and I think it helps. Spravato twice weekly early Jan hoping to help pain but didn't. Still dealing with chronic pain esp around abd but also general aches.  Lyrica has helped some No SE Doing well other than pain.  Had a dip in mood late DEC into early Feb without SI but not interested but better now. Interest in cookie decorating and to make for Maggie's wedding in next year.   ECT-MADRS    [place] Visit from 05/03/2021 in [place] Total Score 32      PHQ2-9    [place] Visit from 03/22/2022 in [place] Visit from 02/09/2022 in [place] Visit from 01/15/2022 in [place] Visit from 10/13/2021 in [place] Visit from 10/03/2021 in [place]  PHQ-2 Total Score 2 0 '3 4 6  '$ PHQ-9 Total Score 11 0 '10 11 19      '$ Flowsheet Row Admission (Discharged) from 08/17/2020 in [place] [facility] from 08/16/2020 in [facility] PREADMISSION TESTING  C-SSRS RISK CATEGORY No Risk No Risk       Maggie 71 and dating.  Past Psychiatric Medication Trials: Failed ECT and TMS,  Spravato amitriptyline helped for 2 to 3 years in 1987,   2021 No benefit from amitriptyline 300 mg with adequate duration.  Nortriptyline 200 with a therapeutic blood level. Trintellix, duloxetine 120, Lexapro 20, Fetzima,  Wellbutrin, fluoxetine, paroxetine, sertraline,  (Poor response to SSRIs) Viibryd 60 [place],   lithium SE,  Latuda 120, Abilify 15,  Vraylar, Rexulti,Seroquel 400, risperidone, Geodon,  Haldol for agitation, lamotrigine 300,  buspirone, clonazepam, gabapentin, Propranolol, clonidine 0.1 mg BID modafinil,  Nuvigil, Vyvanse, Concerta pramipexole,  SHE DOESN'T WANT STIMULANTS AGAIN bc of  anxiety. Trazodone,   GENESIGHT COMPLETED  history of overdose on Xanax, There is a history of suicide attempts and psychiatric hospitalizations.  New PCP good Dimas Chyle   Review of Systems:  Review of Systems  Constitutional:  Positive for fatigue. unchanged Cardiovascular:  Negative for chest pain and  palpitations.  Gastrointestinal:  Negative for diarrhea.  Musculoskeletal:  Positive for back pain and myalgias. Negative for gait problem.       Less pain lately.  Neurological:  Positive for mild tremors .  Negative for dizziness.  Psychiatric/Behavioral:  Positive for dysphoric mood. Negative for confusion, decreased concentration, hallucinations and self-injury. The patient is nervous/anxious.   No changes  Medications: I have reviewed the patient's current medications.  Current Outpatient Medications  Medication Sig Dispense Refill   baclofen (LIORESAL) 10 MG tablet Take 1 tablet (10 mg total) by mouth 3 (three) times daily. 90 each 5   celecoxib (CELEBREX) 200 MG capsule TAKE ONE CAPSULE TWICE DAILY FOR chronic pain 180 capsule 0   Continuous Blood Gluc Receiver (DEXCOM G6 RECEIVER) DEVI Use 4 times daily as needed to check blood sugar 1 each PRN   Continuous Blood Gluc Receiver (DEXCOM G7 RECEIVER) DEVI Use 4 times daily to check blood sugar 1 each 11   Continuous Blood Gluc Sensor (DEXCOM G6 SENSOR) MISC Apply topically 4 (four) times daily.     Continuous Blood Gluc Sensor (DEXCOM G7 SENSOR) MISC USE AS DIRECTED FOUR TIMES DAILY TO CHECK BLOOD SUGAR 1 each 11   Continuous Blood Gluc Transmit (DEXCOM G6 TRANSMITTER) MISC USE AS NEEDED TO CHECK BLOOD SUGAR FOUR TIMES DAILY     Dextromethorphan-buPROPion ER (AUVELITY) 45-105 MG TBCR Take 1 tablet by mouth 2 (two) times daily. 60 tablet 3   Esketamine HCl, 84 MG Dose, (SPRAVATO, 84 MG DOSE,) 28 MG/DEVICE SOPK Place 84 mg into the nose See admin instructions. Use twice weekly     Insulin Pen Needle (PEN NEEDLES) 33G X 4 MM  MISC Use daily as needed to inject insulin 100 each 1   lithium carbonate (ESKALITH) 450 MG ER tablet TAKE TWO TABLETS BY MOUTH AT BEDTIME (Patient taking differently: 1 and 1/2 tablets daily) 180 tablet 0   OLANZapine (ZYPREXA) 2.5 MG tablet TAKE ONE TABLET BY MOUTH AT BEDTIME 30 tablet 2   pantoprazole (PROTONIX) 40 MG tablet TAKE ONE TABLET BY MOUTH ONCE DAILY FOR STOMACH PROTECTION 30 tablet 5   pregabalin (LYRICA) 150 MG capsule Take 1 capsule (150 mg total) by mouth 2 (two) times daily. 60 capsule 5   propranolol ER (INDERAL LA) 160 MG SR capsule TAKE ONE CAPSULE BY MOUTH ONCE DAILY 30 capsule 2   SYNJARDY XR 12.07-998 MG TB24 TAKE TWO TABLETS BY MOUTH DAILY 60 tablet 2   tirzepatide (MOUNJARO) 15 MG/0.5ML Pen Inject 15 mg into the skin once a week. 6 mL 11   Vilazodone HCl (VIIBRYD) 40 MG TABS TAKE 1 & 1/2 TABLETS BY MOUTH DAILY 45 tablet 0   Vitamin D, Ergocalciferol, 50000 units CAPS TAKE ONE CAPSULE BY MOUTH EVERY 7 DAYS 15 capsule 3   XARELTO 20 MG TABS tablet TAKE ONE TABLET BY MOUTH DAILY WITH SUPPER 30 tablet 5   No current facility-administered medications for this visit.    Medication Side Effects: Sedation  Allergies:  Allergies  Allergen Reactions   Tramadol Other (See Comments)    Tingling all over    Hydrocodone     Ineffective     Past Medical History:  Diagnosis Date   Anemia    Anxiety    Asthma    Depression    Diabetes mellitus type 2 in obese ([place]) 06/11/2016   Dyspnea    Embolism - blood clot January 1997 & January 2017   Also had LLE DVT in 1997  History of kidney stones    Hypertension    Preeclampsia 10/10/2011   1999    Sleep apnea     Family History  Problem Relation Age of Onset   Dementia Father    Heart disease Father    Clotting disorder Mother    Arthritis Mother    Clotting disorder Sister    Clotting disorder Maternal Grandmother    Clotting disorder Maternal Aunt    Rheumatologic disease Neg Hx    Hyperparathyroidism Neg Hx      Social History   Socioeconomic History   Marital status: Married    Spouse name: Not on file   Number of children: Y   Years of education: Not on file   Highest education level: Not on file  Occupational History   Occupation: admin assist    Comment: insurance verification  Tobacco Use   Smoking status: Former    Packs/day: 1.00    Years: 20.00    Total pack years: 20.00    Types: Cigarettes    Quit date: 03/12/2004    Years since quitting: 18.1   Smokeless tobacco: Never  Vaping Use   Vaping Use: Never used  Substance and Sexual Activity   Alcohol use: Yes    Alcohol/week: 1.0 standard drink of alcohol    Types: 1 Cans of beer per week    Comment: 1-2 times a week   Drug use: No   Sexual activity: Yes    Partners: Male    Birth control/protection: None  Other Topics Concern   Not on file  Social History Narrative   Originally from [place]. Always lived in [place]. 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 No international travel recently. Previously has been to [place] in May 1996. No mold exposure recently. No bird exposure. Does have multiple pets.    Social Determinants of Health   Financial Resource Strain: Not on file  Food Insecurity: Not on file  Transportation Needs: Not on file  Physical Activity: Not on file  Stress: Not on file  Social Connections: Not on file  Intimate Partner Violence: Not on file    Past Medical History, Surgical history, Social history, and Family history were reviewed and updated as appropriate.   Please see review of systems for further details on the patient's review from today.   Objective:   Physical Exam:  LMP  (LMP Unknown) Comment: last period ---  1 year ago (?)  Physical Exam Constitutional:      General: She is not in acute distress. Musculoskeletal:        General: No deformity.  Neurological:     Mental Status: She is alert and oriented to person, place, and time.     Cranial Nerves: No dysarthria.      Coordination: Coordination normal.  Psychiatric:        Attention and Perception: Attention and perception normal. She does not perceive auditory or visual hallucinations.        Mood and Affect: Mood is [facility] anxious and depressed. Affect is not labile, blunt, angry, tearful or inappropriate.    Not as severe as in the past. Not irritable.    Speech: Speech normal.        Behavior: Behavior normal. Behavior is cooperative.        Thought Content: Thought content normal. Thought content is not paranoid or delusional. Thought content does not include homicidal or suicidal ideation. Thought content does not include suicidal plan.  Cognition and Memory: Cognition and memory normal.        Judgment: Judgment normal.     Comments: Insight intact    Lab Review:     Component Value Date/Time   NA 141 04/11/2022 1054   NA 138 08/21/2019 0000   NA 140 12/11/2016 1236   K 4.5 04/11/2022 1054   K 4.2 12/11/2016 1236   CL 107 04/11/2022 1054   CO2 24 04/11/2022 1054   CO2 24 12/11/2016 1236   GLUCOSE 126 (H) 04/11/2022 1054   GLUCOSE 109 12/11/2016 1236   BUN 14 04/11/2022 1054   BUN 12 08/21/2019 0000   BUN 15.1 12/11/2016 1236   CREATININE 0.70 04/11/2022 1054   CREATININE 0.68 02/22/2020 1649   CREATININE 0.8 12/11/2016 1236   CALCIUM 9.9 04/11/2022 1054   CALCIUM 10.0 12/11/2016 1236   PROT 7.0 04/11/2022 1054   PROT 7.5 12/11/2016 1236   ALBUMIN 4.9 04/11/2022 1054   ALBUMIN 4.4 12/11/2016 1236   AST 30 04/11/2022 1054   AST 38 (H) 12/11/2016 1236   ALT 36 (H) 04/11/2022 1054   ALT 61 (H) 12/11/2016 1236   ALKPHOS 55 04/11/2022 1054   ALKPHOS 71 12/11/2016 1236   BILITOT 0.5 04/11/2022 1054   BILITOT 0.62 12/11/2016 1236   GFRNONAA >60 08/16/2020 1157   GFRNONAA 105 12/11/2018 1121   GFRAA 122 12/11/2018 1121       Component Value Date/Time   WBC 8.6 04/11/2022 1054   RBC 5.01 04/11/2022 1054   HGB 15.0 04/11/2022 1054   HGB 14.5 12/11/2016 1236   HCT 45.2  04/11/2022 1054   HCT 43.9 12/11/2016 1236   PLT 280.0 04/11/2022 1054   PLT 289 12/11/2016 1236   PLT 295 07/24/2016 1848   MCV 90.1 04/11/2022 1054   MCV 93.2 12/11/2016 1236   MCH 29.9 08/16/2020 1157   MCHC 33.1 04/11/2022 1054   RDW 14.5 04/11/2022 1054   RDW 13.5 12/11/2016 1236   LYMPHSABS 2.2 11/22/2020 1223   LYMPHSABS 2.4 12/11/2016 1236   MONOABS 0.6 11/22/2020 1223   MONOABS 0.7 12/11/2016 1236   EOSABS 0.1 11/22/2020 1223   EOSABS 0.7 (H) 12/11/2016 1236   BASOSABS 0.0 11/22/2020 1223   BASOSABS 0.0 12/11/2016 1236    Lithium Lvl  Date Value Ref Range Status  11/22/2020 0.7 0.6 - 1.2 mmol/L Final   11/22/20  lithium 0.7 on 900   Serum nortriptyline level on 150 mg a day was 109  Amitriptyline  Level 12/11/2018 =237  On 250 mg daily,.  12/23/2020 vitamin D level 31.38, vitamin B12 519  No results found for: "PHENYTOIN", "PHENOBARB", "VALPROATE", "CBMZ"   .res Assessment: Plan:    Recurrent major depression resistant to treatment ([place])  Generalized anxiety disorder  Hypersomnolence  Obstructive sleep apnea  Diffuse pain  Lithium-induced tremor  Low vitamin D level  Lithium use   [place] has chronic severe major depression and generalized anxiety that is treatment resistant and failed multiple medications ECT and TMS as noted above including SSRIs, try cyclic's, lithium, and atypical antipsychotics for depression.. She has had partial benefit from amitriptyline plus lithium.  However  tremor problems from the lithium.  Efforts to reduce the lithium have led to worsening psychiatric symptoms.  There has been a discussion about possible bipolar elements but she has no clear manic episodes unrelated to medication changes.  She has features of atypical depression.  So an MAO inhibitor is attractive from that perspective. Not sig depressed and  numbness better.  Good response.  Discussed Spravato option in detail.  She wants to transition Spravato back to  this office.  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  Consider switch and retry pramipexole and push dose. Less numb  Statistically speaking the most potentially beneficial option would be Parnate or Nardil but they are difficult to use   Need to take Viibryd 60 with food emphasized.   check lithium level 0.7 on 900 mg.  Reuced to 675 DT tremor.  continue Auvelity 1 twice daily BC partial benefit Disc SE.   Her improvement seems to correlate with starting this.  Rec counseling.  She's back seeing Dorrene German.. Needs more purpose in her day. She needs a fulfilling job.   Encourage physical activity and weight loss.  She is not particularly motivated and feels that is hard to accomplish because of chronic pain.  Chronic pain complicates her treatment of depression.  Recent B12 deficiency dx and treatment started. vitamin D bc level 31 on 2000U daily  Hold B6 for lithium tremor. 500 mg BID. If needed.  Tremor is better.  None currently.  Try to take lithium also 4-5 hours before sleep to minimize daytime tremor. depression was worse after reducing the lithium.  Irritability was also worse after reducing the lithium to 55 mg daily.   Disc weith loss meds Monjarou recently started for DM and goals.  Last checked lost 17#.  BS is OK.  Has continuous glucose monitor.  Take vitamin D regularly.  Follow-up 8 weeks  Deborah Parents MD, [name]  Future Appointments  Date Time Provider [place]  07/11/2022  8:00 AM Rice, Resa Miner, MD [facility] None    No orders of the defined types were placed in this encounter.      -------------------------------

## 2022-05-17 DIAGNOSIS — F332 Major depressive disorder, recurrent severe without psychotic features: Secondary | ICD-10-CM | POA: Diagnosis not present

## 2022-05-20 ENCOUNTER — Other Ambulatory Visit: Payer: Self-pay | Admitting: Family Medicine

## 2022-05-20 ENCOUNTER — Encounter: Payer: Self-pay | Admitting: Family Medicine

## 2022-05-20 ENCOUNTER — Other Ambulatory Visit: Payer: Self-pay | Admitting: Psychiatry

## 2022-05-20 DIAGNOSIS — F332 Major depressive disorder, recurrent severe without psychotic features: Secondary | ICD-10-CM

## 2022-05-20 DIAGNOSIS — M797 Fibromyalgia: Secondary | ICD-10-CM

## 2022-05-20 DIAGNOSIS — F411 Generalized anxiety disorder: Secondary | ICD-10-CM

## 2022-05-21 DIAGNOSIS — F332 Major depressive disorder, recurrent severe without psychotic features: Secondary | ICD-10-CM | POA: Diagnosis not present

## 2022-05-21 NOTE — Telephone Encounter (Signed)
I appreciate the update. She can increase to '150mg'$  in the morning and '300mg'$  at night for a few weeks. Would like for her to check in with [name] on mychart in a few weeks to let us know how this is working.  Algis Greenhouse. Jerline Pain, MD 05/21/2022 12:12 PM

## 2022-05-22 DIAGNOSIS — F332 Major depressive disorder, recurrent severe without psychotic features: Secondary | ICD-10-CM | POA: Diagnosis not present

## 2022-05-22 NOTE — Telephone Encounter (Signed)
Please send as refill request.  Algis Greenhouse. Jerline Pain, MD 05/22/2022 12:57 PM

## 2022-05-22 NOTE — Telephone Encounter (Signed)
See note

## 2022-05-23 ENCOUNTER — Other Ambulatory Visit: Payer: Self-pay | Admitting: *Deleted

## 2022-05-23 NOTE — Telephone Encounter (Signed)
Rx amitriptyline prescribe by historical provider

## 2022-05-24 DIAGNOSIS — F332 Major depressive disorder, recurrent severe without psychotic features: Secondary | ICD-10-CM | POA: Diagnosis not present

## 2022-05-24 IMAGING — RF DG C-ARM 1-60 MIN
1 series · 2 of 2 positions shown · non-contrast
Comparison: None.

CLINICAL DATA: ACDF C3-C4, C4-C5, C5-C6 with instrumentation and
allograft.

EXAM:
DG CERVICAL SPINE - 1 VIEW; DG C-ARM 1-60 MIN

[Series 1: run · 2 of 2 slices shown]
[im 1/2]
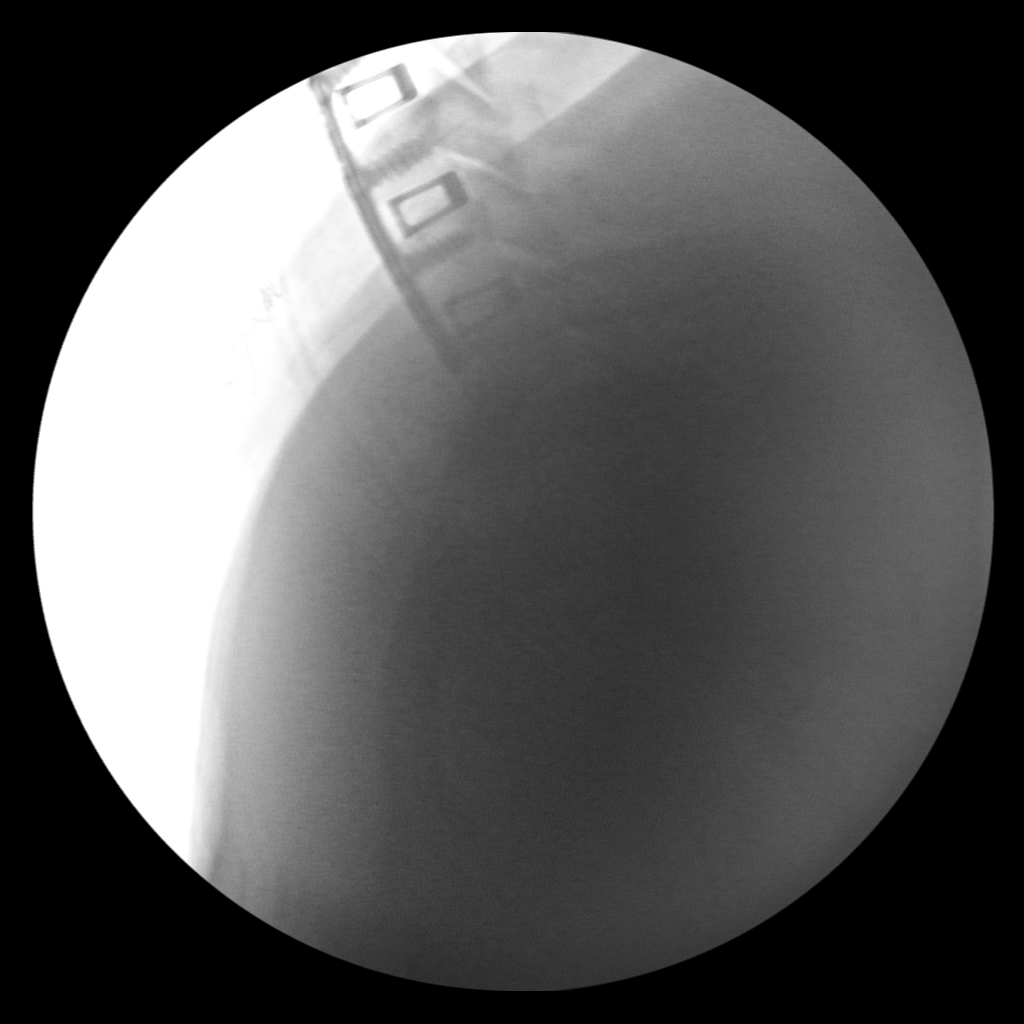
[im 2/2]
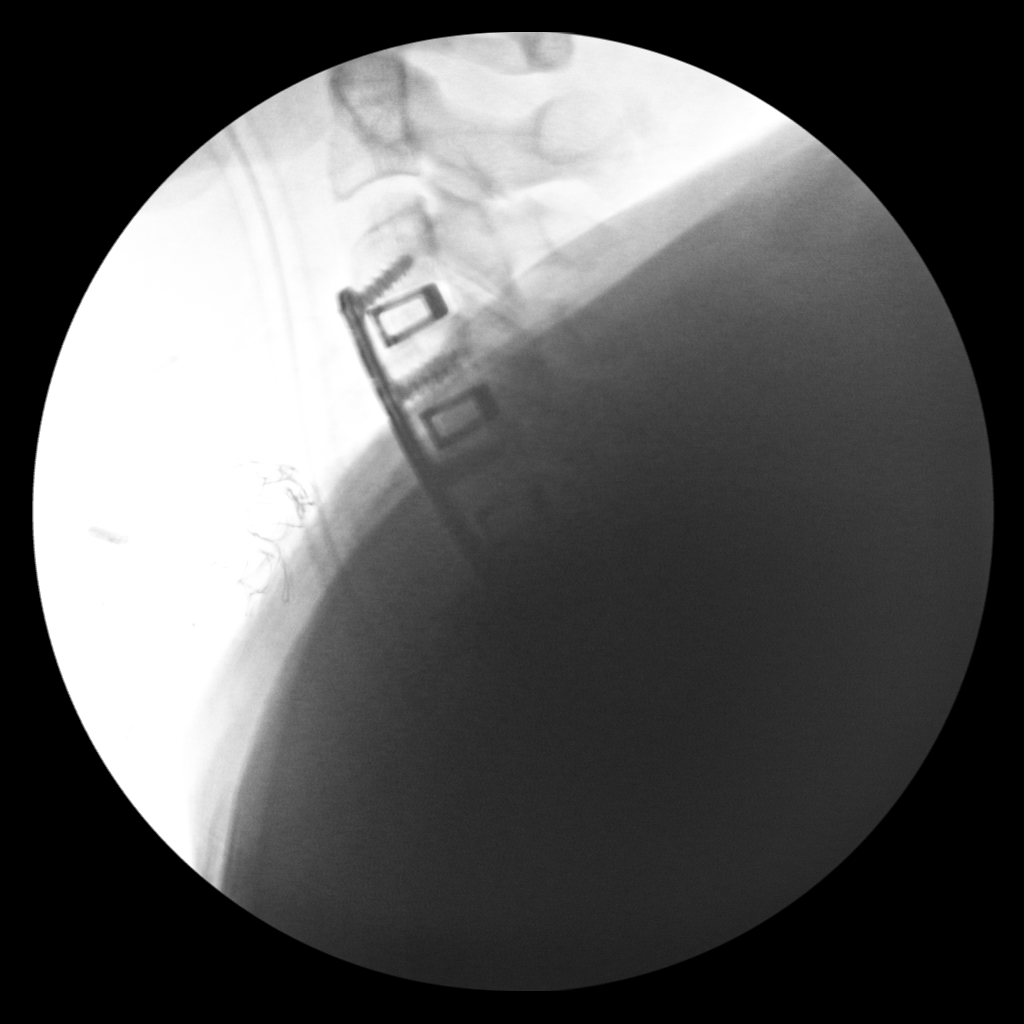

[2 of 2 positions shown; findings below may reference images not displayed]

FINDINGS: Two lateral fluoroscopic spot views of the cervical spine obtained.
Anterior fusion hardware extends from C3 through C6 with interbody
spacers. Lower most aspect of the hardware is not well visualized
due to overlapping structures. Total fluoroscopy time 16 seconds.
Total dose 8.59 mGy.
IMPRESSION: Intraoperative fluoroscopy during cervical spine fusion.

## 2022-05-24 MED ORDER — PREGABALIN 150 MG PO CAPS: ORAL_CAPSULE | ORAL | 5 refills | Status: DC

## 2022-05-28 DIAGNOSIS — F332 Major depressive disorder, recurrent severe without psychotic features: Secondary | ICD-10-CM | POA: Diagnosis not present

## 2022-05-29 DIAGNOSIS — F332 Major depressive disorder, recurrent severe without psychotic features: Secondary | ICD-10-CM | POA: Diagnosis not present

## 2022-05-31 DIAGNOSIS — F332 Major depressive disorder, recurrent severe without psychotic features: Secondary | ICD-10-CM | POA: Diagnosis not present

## 2022-06-04 ENCOUNTER — Other Ambulatory Visit (HOSPITAL_COMMUNITY): Payer: Self-pay

## 2022-06-04 ENCOUNTER — Encounter: Payer: Self-pay | Admitting: Family Medicine

## 2022-06-04 DIAGNOSIS — F332 Major depressive disorder, recurrent severe without psychotic features: Secondary | ICD-10-CM | POA: Diagnosis not present

## 2022-06-05 NOTE — Telephone Encounter (Signed)
Patient stated unable to get Rx, all pharmacy she call they are out of stock Patient due for next dose on Thursday  Please advise if medication changes needed

## 2022-06-05 NOTE — Telephone Encounter (Signed)
Spoke with patient, will call [facility] to see if medication on stock

## 2022-06-05 NOTE — Telephone Encounter (Signed)
We can send in trulcity 4.5mg  weekly or ozempic 2mg  weekly until the backorder resolves.  Algis Greenhouse. Jerline Pain, MD 06/05/2022 8:30 PM

## 2022-06-07 ENCOUNTER — Other Ambulatory Visit: Payer: Self-pay

## 2022-06-07 MED ORDER — SEMAGLUTIDE(0.25 OR 0.5MG/DOS) 2 MG/3ML ~~LOC~~ SOPN: 2.0000 mg | PEN_INJECTOR | SUBCUTANEOUS | 1 refills | Status: DC

## 2022-06-07 MED ORDER — SEMAGLUTIDE (2 MG/DOSE) 8 MG/3ML ~~LOC~~ SOPN: 2.0000 mg | PEN_INJECTOR | SUBCUTANEOUS | 2 refills | Status: DC

## 2022-06-12 DIAGNOSIS — F332 Major depressive disorder, recurrent severe without psychotic features: Secondary | ICD-10-CM | POA: Diagnosis not present

## 2022-06-18 DIAGNOSIS — F332 Major depressive disorder, recurrent severe without psychotic features: Secondary | ICD-10-CM | POA: Diagnosis not present

## 2022-06-19 ENCOUNTER — Other Ambulatory Visit: Payer: Self-pay | Admitting: Psychiatry

## 2022-06-19 ENCOUNTER — Other Ambulatory Visit: Payer: Self-pay | Admitting: Family Medicine

## 2022-06-19 DIAGNOSIS — F332 Major depressive disorder, recurrent severe without psychotic features: Secondary | ICD-10-CM | POA: Diagnosis not present

## 2022-06-21 DIAGNOSIS — F332 Major depressive disorder, recurrent severe without psychotic features: Secondary | ICD-10-CM | POA: Diagnosis not present

## 2022-06-22 ENCOUNTER — Telehealth: Payer: Self-pay | Admitting: Psychiatry

## 2022-06-22 DIAGNOSIS — F332 Major depressive disorder, recurrent severe without psychotic features: Secondary | ICD-10-CM

## 2022-06-22 MED ORDER — LITHIUM CARBONATE ER 450 MG PO TBCR: 675.0000 mg | EXTENDED_RELEASE_TABLET | Freq: Every day | ORAL | 0 refills | Status: DC

## 2022-06-22 NOTE — Telephone Encounter (Signed)
At last visit lithium was reduced to 675, which is 1.5 tabs. Rx had been sent in for 2 tabs. Patient wanted new Rx for 1.5 tabs sent so she didn't end up with a lot of half tabs left. New Rx sent.

## 2022-06-22 NOTE — Telephone Encounter (Signed)
Pt called reporting last visit changed Lithium 450 mg ER  from 2 tablets to 1.5 #45. Please correct Rx sent 4/10 @ [facility].

## 2022-06-25 DIAGNOSIS — F332 Major depressive disorder, recurrent severe without psychotic features: Secondary | ICD-10-CM | POA: Diagnosis not present

## 2022-06-26 ENCOUNTER — Other Ambulatory Visit: Payer: Self-pay | Admitting: Family Medicine

## 2022-06-26 DIAGNOSIS — F332 Major depressive disorder, recurrent severe without psychotic features: Secondary | ICD-10-CM | POA: Diagnosis not present

## 2022-06-28 DIAGNOSIS — F332 Major depressive disorder, recurrent severe without psychotic features: Secondary | ICD-10-CM | POA: Diagnosis not present

## 2022-07-03 DIAGNOSIS — I10 Essential (primary) hypertension: Secondary | ICD-10-CM | POA: Diagnosis not present

## 2022-07-03 DIAGNOSIS — F332 Major depressive disorder, recurrent severe without psychotic features: Secondary | ICD-10-CM | POA: Diagnosis not present

## 2022-07-03 DIAGNOSIS — G4733 Obstructive sleep apnea (adult) (pediatric): Secondary | ICD-10-CM | POA: Diagnosis not present

## 2022-07-04 ENCOUNTER — Other Ambulatory Visit (HOSPITAL_COMMUNITY): Payer: Self-pay

## 2022-07-06 DIAGNOSIS — F332 Major depressive disorder, recurrent severe without psychotic features: Secondary | ICD-10-CM | POA: Diagnosis not present

## 2022-07-10 DIAGNOSIS — F332 Major depressive disorder, recurrent severe without psychotic features: Secondary | ICD-10-CM | POA: Diagnosis not present

## 2022-07-11 ENCOUNTER — Encounter: Payer: Self-pay | Admitting: Internal Medicine

## 2022-07-11 ENCOUNTER — Ambulatory Visit: Payer: BC Managed Care – PPO | Attending: Internal Medicine | Admitting: Internal Medicine

## 2022-07-11 VITALS — BP 101/70 | HR 83 | Resp 14 | Ht 68.25 in | Wt 216.0 lb

## 2022-07-11 DIAGNOSIS — R768 Other specified abnormal immunological findings in serum: Secondary | ICD-10-CM | POA: Diagnosis not present

## 2022-07-11 DIAGNOSIS — R52 Pain, unspecified: Secondary | ICD-10-CM | POA: Diagnosis not present

## 2022-07-11 DIAGNOSIS — D6859 Other primary thrombophilia: Secondary | ICD-10-CM

## 2022-07-11 NOTE — Progress Notes (Signed)
Office Visit Note  Patient: Deborah Soto             Date of Birth: 11-16-1965           MRN: [id number]             PCP: Ardith Dark, MD Referring: Ardith Dark, MD Visit Date: 07/11/2022  Subjective:  New Patient (Initial Visit) (Patient states she is feeling better than she was but when she is not feeling well she has soreness around her abdomen. Patient states her knees, hips, and hands are what hurt the most.)   History of Present Illness: Deborah Soto is a 57 y.o. female here for evaluation of positive ANA associated with chronic joint pain in multiple areas. She is on treatment with Celebrex, Lyrica, and baclofen with chronic osteoarthritis, degenerative disc disease with some radiculopathy, and fibromyalgia body pains.  She has had chronic ongoing pain symptoms for years and has had previous epidural steroid injection for multilevel degenerative disease in the spine.  Had anterior cervical decompression surgery with Dr. Yevette Edwards in 2022.  She is also had previous left elbow cubital tunnel nerve transposition surgery.  She is also had issues with hypercoagulability disorder which is apparently hereditary with extensive family history though denies specific known mutation or clotting factor defect.  Previously saw Dr. Cyndie Chime for evaluation years ago.  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  She saw Dr. Dierdre Forth at [facility] rheumatology in 2017 with positive ANA at the time of symptoms most consistent for fibromyalgia syndrome without any primary autoimmune connective tissue disease.  Has had intermittent autoimmune disease lab testing with mostly low positive ANA without specific autoantibodies detected.  Changes symptom within the past few months with increase in abdominal pain as well as somewhat generalized worsening not associated with any  visible swelling rash or discoloration.  The symptoms improved again since about 6 to 8 weeks ago without any particular intervention.  Currently having symptoms mostly in her hands hips and knees.  Does not feel typical to previous nerve impingement.  Not seeing much associated visible joint swelling or erythema.  She feels her current medications are pretty beneficial for the symptoms.  Also takes turmeric and ginger supplements for arthritis.  Labs reviewed ANA 1:320 homogenous  Activities of Daily Living:  Patient reports morning stiffness for 5 minutes.   Patient Reports nocturnal pain.  Difficulty dressing/grooming: Denies Difficulty climbing stairs: Reports Difficulty getting out of chair: Reports Difficulty using hands for taps, buttons, cutlery, and/or writing: Reports  Review of Systems  Constitutional:  Negative for fatigue.  HENT:  Negative for mouth sores and mouth dryness.   Eyes:  Negative for dryness.  Respiratory:  Negative for shortness of breath.   Cardiovascular:  Negative for chest pain and palpitations.  Gastrointestinal:  Negative for blood in stool, constipation and diarrhea.  Endocrine: Negative for increased urination.  Genitourinary:  Negative for involuntary urination.  Musculoskeletal:  Positive for joint pain, gait problem, joint pain, myalgias, muscle weakness, morning stiffness, muscle tenderness and myalgias. Negative for joint swelling.  Skin:  Positive for hair loss and sensitivity to sunlight. Negative for color change and rash.  Allergic/Immunologic: Negative for susceptible to infections.  Neurological:  Negative for dizziness and headaches.  Hematological:  Negative for swollen glands.  Psychiatric/Behavioral:  Negative for depressed mood and sleep disturbance. The patient is nervous/anxious.  PMFS History:  Patient Active Problem List   Diagnosis Date Noted   Radiculopathy 08/17/2020   Rash 07/28/2020   Vitamin D deficiency 05/03/2020    Neuropathy 04/06/2020   S/P ECT (electroconvulsive therapy) 06/02/2019   Memory loss due to medical condition 06/02/2019   Intention tremor 06/02/2019   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   OSA on CPAP 01/26/2019   Long term use of drug 10/08/2018   Insomnia 10/08/2018   Anxiety 10/08/2018   Tremors of nervous system 10/08/2018   Acne 01/10/2018   Primary hypercoagulable state (HCC) 10/10/2017   Vitamin B12 deficiency 03/18/2017   Diffuse pain 02/22/2017   Morbid obesity (HCC) 06/22/2016   MDD (major depressive disorder), recurrent severe, without psychosis (HCC) 06/14/2016   Diabetes mellitus type 2 in obese 06/11/2016   Positive ANA (antinuclear antibody) 04/02/2016   Hyperparathyroidism (HCC) 01/09/2016   Cervical disc disorder with radiculopathy of cervical region 05/12/2015   Hepatic steatosis 04/18/2015   Achilles tendonitis 04/08/2012   PTE (pulmonary thromboembolism) (HCC) 10/10/2011   Affective disorder (HCC) 10/10/2011   AR (allergic rhinitis) 10/10/2011    Past Medical History:  Diagnosis Date   Anemia    Anxiety    Asthma    Depression    Diabetes mellitus type 2 in obese 06/11/2016   Dyspnea    Embolism - blood clot January 1997 & January 2017   Also had LLE DVT in 1997   History of kidney stones    Hypertension    Preeclampsia 10/10/2011   1999    Sleep apnea     Family History  Problem Relation Age of Onset   Dementia Father    Heart disease Father    Clotting disorder Mother    Arthritis Mother    Clotting disorder Sister    Clotting disorder Maternal Grandmother    Clotting disorder Maternal Aunt    Rheumatologic disease Neg Hx    Hyperparathyroidism Neg Hx    Past Surgical History:  Procedure Laterality Date   ANTERIOR CERVICAL DECOMPRESSION/DISCECTOMY FUSION 4 LEVELS N/A 08/17/2020   Procedure: ANTERIOR CERVICAL DECOMPRESSION FUSION CERVICAL THREE - CERVICAL FOUR, CERVICAL  FOUR- CERVICAL FIVE, CERVICAL FIVE- CERVICAL SIX WITH INSTRUMENTATION AND ALLOGRAFT;  Surgeon: Estill Bamberg, MD;  Location: [facility] OR;  Service: Orthopedics;  Laterality: N/A;   KIDNEY STONE SURGERY     Social History   Social History Narrative   Originally from [place]. Always lived in [place]. 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 No international travel recently. Previously has been to [place] in May 1996. No mold exposure recently. No bird exposure. Does have multiple pets.    Immunization History  Administered Date(s) Administered   Influenza,inj,Quad PF,6+ Mos 01/10/2018   Influenza-Unspecified 01/10/2018   Moderna Covid-19 Vaccine Bivalent Booster 21yrs & up 01/24/2021   Moderna Sars-Covid-2 Vaccination 03/09/2020   PFIZER(Purple Top)SARS-COV-2 Vaccination 06/30/2019, 07/21/2019   Zoster Recombinat (Shingrix) 01/13/2021, 03/30/2021     Objective: Vital Signs: BP 101/70 (BP Location: Left Arm, Patient Position: Sitting, Cuff Size: Normal)   Pulse 83   Resp 14   Ht 5' 8.25" (1.734 m)   Wt 216 lb (98 kg)   LMP  (LMP Unknown) Comment: last period ---  1 year ago (?)  BMI 32.60 kg/m    Physical Exam Constitutional:      Appearance: She is obese.  HENT:     Mouth/Throat:     Mouth: Mucous membranes are Soto.  Pharynx: Oropharynx is clear.  Eyes:     Conjunctiva/sclera: Conjunctivae normal.  Cardiovascular:     Rate and Rhythm: Normal rate and regular rhythm.  Pulmonary:     Effort: Pulmonary effort is normal.     Breath sounds: Normal breath sounds.  Musculoskeletal:     Right lower leg: Edema present.     Left lower leg: No edema.  Lymphadenopathy:     Cervical: No cervical adenopathy.  Skin:    General: Skin is warm and dry.     Findings: No rash.     Comments: Normal nailfold capillaries  Neurological:     Mental Status: She is alert.  Psychiatric:        Mood and Affect: Mood normal.     Musculoskeletal Exam:  Neck full ROM no tenderness Shoulders  full ROM anterior tenderness and some pain with overhead abduction Elbows full ROM no tenderness or swelling Wrists full ROM no tenderness or swelling Fingers full ROM no tenderness or swelling Knees full ROM no tenderness or swelling, patellofemoral crepitus present Ankles full ROM no tenderness or swelling   Investigation: No additional findings.  Imaging: No results found.  Recent Labs: Lab Results  Component Value Date   WBC 8.6 04/11/2022   HGB 15.0 04/11/2022   PLT 280.0 04/11/2022   NA 141 04/11/2022   K 4.5 04/11/2022   CL 107 04/11/2022   CO2 24 04/11/2022   GLUCOSE 126 (H) 04/11/2022   BUN 14 04/11/2022   CREATININE 0.70 04/11/2022   BILITOT 0.5 04/11/2022   ALKPHOS 55 04/11/2022   AST 30 04/11/2022   ALT 36 (H) 04/11/2022   PROT 7.0 04/11/2022   ALBUMIN 4.9 04/11/2022   CALCIUM 9.9 04/11/2022   GFRAA 122 12/11/2018    Speciality Comments: No specialty comments available.  Procedures:  No procedures performed Allergies: Tramadol and Hydrocodone   Assessment / Plan:     Visit Diagnoses: Positive ANA (antinuclear antibody) - Plan: Anti-scleroderma antibody, RNP Antibody, Anti-Smith antibody, Sjogrens syndrome-A extractable nuclear antibody, Sjogrens syndrome-B extractable nuclear antibody, Anti-DNA antibody, double-stranded, C3 and C4  Multiple reported symptoms suggestive for inflammatory process but nothing obvious on exam today and per patient reported symptoms have improved compared to time of referral.  Checking specific antibody panel as detailed above for evaluation of the moderately positive ANA as well as serum complements.  If they are highly positive test results we need to follow-up to discuss whether to start treatment options but if negative probably most useful to just plan to follow-up to take a look when symptom exacerbation returns.  Primary hypercoagulable state (HCC) - Plan: Beta-2 glycoprotein antibodies, Cardiolipin antibodies, IgG, IgM,  IgA  Previous venous thrombosis underlying mechanism not clear will check antiphospholipid antibody markers associated with the positive ANA in this clinical history.  Diffuse pain  Has somewhat widespread joint pain symptoms not well localized to specific joints and without associated inflammatory changes.  May be better explained with chronic pain syndrome with history of fibromyalgia. Already on fairly good medication regimen for this problem.  Orders: Orders Placed This Encounter  Procedures   Anti-scleroderma antibody   RNP Antibody   Anti-Smith antibody   Sjogrens syndrome-A extractable nuclear antibody   Sjogrens syndrome-B extractable nuclear antibody   Anti-DNA antibody, double-stranded   C3 and C4   Beta-2 glycoprotein antibodies   Cardiolipin antibodies, IgG, IgM, IgA   No orders of the defined types were placed in this encounter.    Follow-Up Instructions: Return  if symptoms worsen or fail to improve.   Fuller Plan, MD  Note - This record has been created using [employer].  Chart creation errors have been sought, but may not always  have been located. Such creation errors do not reflect on  the standard of medical care.

## 2022-07-12 LAB — C3 AND C4: C4 Complement: 27 mg/dL (ref 15–57)

## 2022-07-12 LAB — SJOGRENS SYNDROME-B EXTRACTABLE NUCLEAR ANTIBODY: SSB (La) (ENA) Antibody, IgG: <1 AI

## 2022-07-12 LAB — ANTI-SCLERODERMA ANTIBODY: Scleroderma (Scl-70) (ENA) Antibody, IgG: <1 AI

## 2022-07-14 LAB — CARDIOLIPIN ANTIBODIES, IGG, IGM, IGA
Anticardiolipin IgA: <2 APL-U/mL (ref <20.0)
Anticardiolipin IgG: <2 GPL-U/mL (ref <20.0)
Anticardiolipin IgM: <2 MPL-U/mL (ref <20.0)

## 2022-07-14 LAB — BETA-2 GLYCOPROTEIN ANTIBODIES
Beta-2 Glyco 1 IgA: <2 U/mL (ref <20.0)
Beta-2 Glyco 1 IgM: <2 U/mL (ref <20.0)
Beta-2 Glyco I IgG: <2 U/mL (ref <20.0)

## 2022-07-14 LAB — ANTI-SMITH ANTIBODY: ENA SM Ab Ser-aCnc: <1 AI

## 2022-07-14 LAB — SJOGRENS SYNDROME-A EXTRACTABLE NUCLEAR ANTIBODY: SSA (Ro) (ENA) Antibody, IgG: <1 AI

## 2022-07-14 LAB — C3 AND C4: C3 Complement: 167 mg/dL (ref 83–193)

## 2022-07-14 LAB — RNP ANTIBODY: Ribonucleic Protein(ENA) Antibody, IgG: 1.1 AI — AB

## 2022-07-14 LAB — ANTI-DNA ANTIBODY, DOUBLE-STRANDED: ds DNA Ab: <1 IU/mL

## 2022-07-16 DIAGNOSIS — F332 Major depressive disorder, recurrent severe without psychotic features: Secondary | ICD-10-CM | POA: Diagnosis not present

## 2022-07-17 DIAGNOSIS — F332 Major depressive disorder, recurrent severe without psychotic features: Secondary | ICD-10-CM | POA: Diagnosis not present

## 2022-07-19 ENCOUNTER — Other Ambulatory Visit: Payer: Self-pay

## 2022-07-19 ENCOUNTER — Ambulatory Visit: Payer: BC Managed Care – PPO | Admitting: Family Medicine

## 2022-07-19 ENCOUNTER — Other Ambulatory Visit: Payer: Self-pay | Admitting: Psychiatry

## 2022-07-19 ENCOUNTER — Ambulatory Visit (INDEPENDENT_AMBULATORY_CARE_PROVIDER_SITE_OTHER): Payer: BC Managed Care – PPO

## 2022-07-19 VITALS — BP 116/82 | HR 86 | Ht 68.0 in | Wt 216.0 lb

## 2022-07-19 DIAGNOSIS — F332 Major depressive disorder, recurrent severe without psychotic features: Secondary | ICD-10-CM | POA: Diagnosis not present

## 2022-07-19 DIAGNOSIS — M7061 Trochanteric bursitis, right hip: Secondary | ICD-10-CM | POA: Diagnosis not present

## 2022-07-19 DIAGNOSIS — M25551 Pain in right hip: Secondary | ICD-10-CM | POA: Diagnosis not present

## 2022-07-19 DIAGNOSIS — F411 Generalized anxiety disorder: Secondary | ICD-10-CM

## 2022-07-19 NOTE — Progress Notes (Signed)
Rubin Payor, PhD, LAT, ATC acting as a scribe for [name], MD.  Deborah Soto is a 57 y.o. female who presents to [facility] today for R hip pain. Pt was previously seen by Dr. Denyse Amass on 11/15/20 for thoracic back pain.   Today, pt c/o R hip pain ongoing since last Monday, April 29th. She was just walking in the grocery store and her R hip started "popping." Pt locates pain to the lateral aspect of her R hip. Worse when she first transitions to stand.   Radiates: no LE Numbness/tingling: no LE Weakness: no Aggravates: prolonged sitting, crossing her legs Treatments tried: Celebrex, Lyrica  Pertinent review of systems: No fevers or chills  Relevant historical information: DVT.  Diabetes.  Obesity. Possible rheumatologic problem elevated ANA.  Has seen rheumatologist.  Exam:  BP 116/82   Pulse 86   Ht 5\' 8"  (1.727 m)   Wt 216 lb (98 kg)   LMP  (LMP Unknown) Comment: last period ---  1 year ago (?)  SpO2 97%   BMI 32.84 kg/m  General: Well Developed, well nourished, and in no acute distress.   MSK: Right hip: Normal-appearing Normal hip motion. Tender palpation at greater trochanter.  Hip abduction and external rotation strength are diminished and painful rated 4/5.     Lab and Radiology Results  Diagnostic Limited MSK Ultrasound of: Right lateral hip Mild bursitis visualized superficial to the greater trochanter right lateral hip. Impression: Greater trochanteric bursitis right  X-ray images right hip obtained today personally and independently interpreted Mild enthesiopathy changes located at the greater trochanter.  No acute fractures.  No severe DJD present in the hip. Await formal radiology review   Assessment and Plan: 57 y.o. female with right lateral hip pain thought to be due to greater trochanteric bursitis.  She is a good candidate for physical therapy and home exercise trial.  Plan to refer to PT.  Iontophoresis  may be especially helpful.  We talked about the possibility of a steroid injection to we both agree that it is reasonable to wait.  Recheck in 6 weeks or sooner if needed. Avoid high-dose oral NSAIDs given diabetes history.  PDMP not reviewed this encounter. Orders Placed This Encounter  Procedures   [name] LIMITED JOINT SPACE STRUCTURES LOW RIGHT(NO LINKED CHARGES)    Order Specific Question:   Reason for Exam (SYMPTOM  OR DIAGNOSIS REQUIRED)    Answer:   right hip pain    Order Specific Question:   Preferred imaging location?    Answer:   [facility]   DG HIP UNILAT WITH PELVIS 2-3 VIEWS RIGHT    Standing Status:   Future    Number of Occurrences:   1    Standing Expiration Date:   07/19/2023    Order Specific Question:   Reason for Exam (SYMPTOM  OR DIAGNOSIS REQUIRED)    Answer:   eval hip pain r    Order Specific Question:   Is patient pregnant?    Answer:   No    Order Specific Question:   Preferred imaging location?    Answer:   [facility]   Ambulatory referral to Physical Therapy    Referral Priority:   Routine    Referral Type:   Physical Medicine    Referral Reason:   Specialty Services Required    Requested Specialty:   Physical Therapy    Number of Visits Requested:   1  No orders of the defined types were placed in this encounter.    Discussed warning signs or symptoms. Please see discharge instructions. Patient expresses understanding.   The above documentation has been reviewed and is accurate and complete Lynne Leader, M.D.

## 2022-07-19 NOTE — Patient Instructions (Addendum)
Thank you for coming in today.   Please get an Xray today before you leave   I've referred you to Physical Therapy.  Let us know if you don't hear from them in one week.   Recheck in 6 weeks especially if not better.   I can do an injection any time. Just let me know.

## 2022-07-23 ENCOUNTER — Ambulatory Visit (INDEPENDENT_AMBULATORY_CARE_PROVIDER_SITE_OTHER): Payer: BC Managed Care – PPO | Admitting: Physical Therapy

## 2022-07-23 ENCOUNTER — Encounter: Payer: Self-pay | Admitting: Physical Therapy

## 2022-07-23 DIAGNOSIS — F332 Major depressive disorder, recurrent severe without psychotic features: Secondary | ICD-10-CM | POA: Diagnosis not present

## 2022-07-23 DIAGNOSIS — M25551 Pain in right hip: Secondary | ICD-10-CM

## 2022-07-23 NOTE — Therapy (Signed)
OUTPATIENT PHYSICAL THERAPY LOWER EXTREMITY EVALUATION   Patient Name: Deborah Soto MRN: [id number] DOB:1965-09-14, 57 y.o., female Today's Date: 07/23/2022  END OF SESSION:  PT End of Session - 07/23/22 1103     Visit Number 1    Number of Visits 16    Date for PT Re-Evaluation 09/17/22    Authorization Type BCBS    PT Start Time 1104    PT Stop Time 1140    PT Time Calculation (min) 36 min    Activity Tolerance Patient tolerated treatment well    Behavior During Therapy [facility] for tasks assessed/performed             Past Medical History:  Diagnosis Date   Anemia    Anxiety    Asthma    Depression    Diabetes mellitus type 2 in obese 06/11/2016   Dyspnea    Embolism - blood clot January 1997 & January 2017   Also had LLE DVT in 1997   History of kidney stones    Hypertension    Preeclampsia 10/10/2011   1999    Sleep apnea    Past Surgical History:  Procedure Laterality Date   ANTERIOR CERVICAL DECOMPRESSION/DISCECTOMY FUSION 4 LEVELS N/A 08/17/2020   Procedure: ANTERIOR CERVICAL DECOMPRESSION FUSION CERVICAL THREE - CERVICAL FOUR, CERVICAL FOUR- CERVICAL FIVE, CERVICAL FIVE- CERVICAL SIX WITH INSTRUMENTATION AND ALLOGRAFT;  Surgeon: Estill Bamberg, MD;  Location: [facility] OR;  Service: Orthopedics;  Laterality: N/A;   KIDNEY STONE SURGERY     Patient Active Problem List   Diagnosis Date Noted   Radiculopathy 08/17/2020   Rash 07/28/2020   Vitamin D deficiency 05/03/2020   Neuropathy 04/06/2020   S/P ECT (electroconvulsive therapy) 06/02/2019   Memory loss due to medical condition 06/02/2019   Intention tremor 06/02/2019   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   OSA on CPAP 01/26/2019   Long term use of drug 10/08/2018   Insomnia 10/08/2018   Anxiety 10/08/2018   Tremors of nervous system 10/08/2018   Acne 01/10/2018   Primary hypercoagulable state (HCC) 10/10/2017   Vitamin B12 deficiency  03/18/2017   Diffuse pain 02/22/2017   Morbid obesity (HCC) 06/22/2016   MDD (major depressive disorder), recurrent severe, without psychosis (HCC) 06/14/2016   Diabetes mellitus type 2 in obese 06/11/2016   Positive ANA (antinuclear antibody) 04/02/2016   Hyperparathyroidism (HCC) 01/09/2016   Cervical disc disorder with radiculopathy of cervical region 05/12/2015   Hepatic steatosis 04/18/2015   Achilles tendonitis 04/08/2012   PTE (pulmonary thromboembolism) (HCC) 10/10/2011   Affective disorder (HCC) 10/10/2011   AR (allergic rhinitis) 10/10/2011    PCP: Jacquiline Doe  REFERRING PROVIDER: [name]   REFERRING DIAG: R hip pain   THERAPY DIAG:  Pain in right hip  Rationale for Evaluation and Treatment: Rehabilitation  ONSET DATE:   SUBJECTIVE:   SUBJECTIVE STATEMENT: Pt states new onset of pain in R hip. States Increased pain with initial standing/walking.  Feels R leg is weaker in general, epecially on stairs.   PERTINENT HISTORY:DM, HTN,    PAIN:  Are you having pain? Yes: NPRS scale: 6-7 /10 Pain location: R hip Pain description: sore Aggravating factors: initial standing,  Relieving factors: none stated   PRECAUTIONS: None  WEIGHT BEARING RESTRICTIONS: No  FALLS:  Has patient fallen in last 6 months? No   PLOF: Independent  PATIENT GOALS: decreased pain   NEXT MD VISIT:   OBJECTIVE:   DIAGNOSTIC FINDINGS:  PATIENT SURVEYS:   FOTO:  54  COGNITION: Overall cognitive status: Within functional limits for tasks assessed     SENSATION:   EDEMA: none   POSTURE: rounded shoulders and forward head  PALPATION:  tenderness to R gr troch, and into surrounding glute musculature    LOWER EXTREMITY ROM:  Hip ROM: WFL Knees: WFL  LOWER EXTREMITY MMT:  MMT Right eval Left eval  Hip flexion 4- 4-  Hip extension    Hip abduction 4- 4-  Hip adduction    Hip internal rotation    Hip external rotation    Knee flexion 4 4  Knee  extension 4 4  Ankle dorsiflexion    Ankle plantarflexion    Ankle inversion    Ankle eversion     (Blank rows = not tested)  LOWER EXTREMITY SPECIAL TESTS:   GAIT:unremarkable     TODAY'S TREATMENT:                                                                                                                              DATE:   07/23/22  Ther ex: see below for HEP Modalities: iontophoresis with dexamethasone to R gr troch, 4 hour patch.   PATIENT EDUCATION:  Education details: PT POC, Exam findings, HEP Person educated: Patient Education method: Explanation, Demonstration, Tactile cues, Verbal cues, and Handouts Education comprehension: verbalized understanding, returned demonstration, verbal cues required, tactile cues required, and needs further education   HOME EXERCISE PROGRAM: Access Code: [email or web address] URL: [email or web address] Date: 07/23/2022 Prepared by: Sedalia Muta  Exercises - Supine Lower Trunk Rotation  - 2 x daily - 10 reps - 5 hold - Supine March  - 2 x daily - 2 sets - 10 reps - Sidelying Hip Abduction  - 1 x daily - 1- 2 sets - 10 reps - Supine Bridge  - 1 x daily - 1-2 sets - 10 reps - 3 hold   ASSESSMENT:  CLINICAL IMPRESSION: Patient presents with primary complaint of increased pain in R hip, consistent with bursitis. She has tenderness in gr trochanter as well as weakness in both hips. She has decreased ability for full functional activities due to pain. She will benefit from skilled PT to improve deficits and pain.    OBJECTIVE IMPAIRMENTS: decreased activity tolerance, decreased mobility, decreased strength, increased muscle spasms, improper body mechanics, and pain.   ACTIVITY LIMITATIONS: bending, standing, squatting, stairs, transfers, and locomotion level  PARTICIPATION LIMITATIONS: cleaning, laundry, shopping, and community activity  PERSONAL FACTORS:  none  are also affecting patient's functional outcome.   REHAB  POTENTIAL: Good  CLINICAL DECISION MAKING: Stable/uncomplicated  EVALUATION COMPLEXITY: Low   GOALS: Goals reviewed with patient? Yes  SHORT TERM GOALS: Target date: 08/06/2022   Pt to be independent with initial HEP  Goal status: INITIAL    LONG TERM GOALS: Target date: 09/17/2022  Pt to be independent with final HEP  Goal status: INITIAL  2.  Pt to  report decreased pain in R hip to 0-2/10 with standing activity, walking and transfers.   Goal status: INITIAL  3.  Pt to demo improved strength of bil hips to at least 4/5 to improve stability and pain.   Goal status: INITIAL  4.  Pt to demo safe ability for navigating at least 5 steps, without hand rail, to improve ability for community navigation.   Goal status: INITIAL   PLAN:  PT FREQUENCY: 1-2x/week  PT DURATION: 8 weeks  PLANNED INTERVENTIONS: Therapeutic exercises, Therapeutic activity, Neuromuscular re-education, Patient/Family education, Self Care, Joint mobilization, Joint manipulation, Stair training, Orthotic/Fit training, DME instructions, Aquatic Therapy, Dry Needling, Electrical stimulation, Cryotherapy, Moist heat, Taping, Ultrasound, Ionotophoresis 4mg /ml Dexamethasone, Manual therapy,  Vasopneumatic device, Traction, Spinal manipulation, Spinal mobilization,Balance training, Gait training,   PLAN FOR NEXT SESSION: Hip strength, stairs, ionto  Sedalia Muta, PT, DPT 1:28 PM  07/25/22

## 2022-07-25 NOTE — Progress Notes (Signed)
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

## 2022-07-26 ENCOUNTER — Ambulatory Visit (INDEPENDENT_AMBULATORY_CARE_PROVIDER_SITE_OTHER): Payer: BC Managed Care – PPO | Admitting: Physical Therapy

## 2022-07-26 ENCOUNTER — Ambulatory Visit: Payer: BC Managed Care – PPO | Admitting: Physical Therapy

## 2022-07-26 ENCOUNTER — Encounter: Payer: Self-pay | Admitting: Physical Therapy

## 2022-07-26 DIAGNOSIS — M25551 Pain in right hip: Secondary | ICD-10-CM | POA: Diagnosis not present

## 2022-07-26 DIAGNOSIS — F332 Major depressive disorder, recurrent severe without psychotic features: Secondary | ICD-10-CM | POA: Diagnosis not present

## 2022-07-26 NOTE — Therapy (Signed)
OUTPATIENT PHYSICAL THERAPY LOWER EXTREMITY EVALUATION   Patient Name: Deborah Soto MRN: [id number] DOB:1965-12-08, 57 y.o., female Today's Date: 07/26/2022  END OF SESSION:  PT End of Session - 07/26/22 1015     Visit Number 2    Number of Visits 16    Date for PT Re-Evaluation 09/17/22    Authorization Type BCBS    PT Start Time 1016    PT Stop Time 1055    PT Time Calculation (min) 39 min    Activity Tolerance Patient tolerated treatment well    Behavior During Therapy [facility] for tasks assessed/performed             Past Medical History:  Diagnosis Date   Anemia    Anxiety    Asthma    Depression    Diabetes mellitus type 2 in obese 06/11/2016   Dyspnea    Embolism - blood clot January 1997 & January 2017   Also had LLE DVT in 1997   History of kidney stones    Hypertension    Preeclampsia 10/10/2011   1999    Sleep apnea    Past Surgical History:  Procedure Laterality Date   ANTERIOR CERVICAL DECOMPRESSION/DISCECTOMY FUSION 4 LEVELS N/A 08/17/2020   Procedure: ANTERIOR CERVICAL DECOMPRESSION FUSION CERVICAL THREE - CERVICAL FOUR, CERVICAL FOUR- CERVICAL FIVE, CERVICAL FIVE- CERVICAL SIX WITH INSTRUMENTATION AND ALLOGRAFT;  Surgeon: Estill Bamberg, MD;  Location: [facility] OR;  Service: Orthopedics;  Laterality: N/A;   KIDNEY STONE SURGERY     Patient Active Problem List   Diagnosis Date Noted   Radiculopathy 08/17/2020   Rash 07/28/2020   Vitamin D deficiency 05/03/2020   Neuropathy 04/06/2020   S/P ECT (electroconvulsive therapy) 06/02/2019   Memory loss due to medical condition 06/02/2019   Intention tremor 06/02/2019   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   OSA on CPAP 01/26/2019   Long term use of drug 10/08/2018   Insomnia 10/08/2018   Anxiety 10/08/2018   Tremors of nervous system 10/08/2018   Acne 01/10/2018   Primary hypercoagulable state (HCC) 10/10/2017   Vitamin B12 deficiency  03/18/2017   Diffuse pain 02/22/2017   Morbid obesity (HCC) 06/22/2016   MDD (major depressive disorder), recurrent severe, without psychosis (HCC) 06/14/2016   Diabetes mellitus type 2 in obese 06/11/2016   Positive ANA (antinuclear antibody) 04/02/2016   Hyperparathyroidism (HCC) 01/09/2016   Cervical disc disorder with radiculopathy of cervical region 05/12/2015   Hepatic steatosis 04/18/2015   Achilles tendonitis 04/08/2012   PTE (pulmonary thromboembolism) (HCC) 10/10/2011   Affective disorder (HCC) 10/10/2011   AR (allergic rhinitis) 10/10/2011    PCP: Jacquiline Doe  REFERRING PROVIDER: [name]   REFERRING DIAG: R hip pain   THERAPY DIAG:  Pain in right hip  Rationale for Evaluation and Treatment: Rehabilitation  ONSET DATE:   SUBJECTIVE:   SUBJECTIVE STATEMENT: Pt states continued soreness in hip. Has been doing HEP.    PERTINENT HISTORY:DM, HTN,    PAIN:  Are you having pain? Yes: NPRS scale: 6-7 /10 Pain location: R hip Pain description: sore Aggravating factors: initial standing,  Relieving factors: none stated   PRECAUTIONS: None  WEIGHT BEARING RESTRICTIONS: No  FALLS:  Has patient fallen in last 6 months? No   PLOF: Independent  PATIENT GOALS: decreased pain   NEXT MD VISIT:   OBJECTIVE:   DIAGNOSTIC FINDINGS:   PATIENT SURVEYS:   FOTO:  22  COGNITION: Overall cognitive status: Within functional  limits for tasks assessed     SENSATION:   EDEMA: none   POSTURE: rounded shoulders and forward head  PALPATION:  tenderness to R gr troch, and into surrounding glute musculature    LOWER EXTREMITY ROM:  Hip ROM: WFL Knees: WFL  LOWER EXTREMITY MMT:  MMT Right eval Left eval  Hip flexion 4- 4-  Hip extension    Hip abduction 4- 4-  Hip adduction    Hip internal rotation    Hip external rotation    Knee flexion 4 4  Knee extension 4 4  Ankle dorsiflexion    Ankle plantarflexion    Ankle inversion    Ankle  eversion     (Blank rows = not tested)  LOWER EXTREMITY SPECIAL TESTS:   GAIT:unremarkable     TODAY'S TREATMENT:                                                                                                                              DATE:   07/26/22:  Therapeutic Exercise: Aerobic: Supine:  Clams double GTB x 20;  Supine march with TA x 20;  TA with breathing 2 x 10; Bridging 2 x 10;  Seated: S/L:  Hip abd 2 x 10 bil;  Standing: Stretches:  LTR x 20;  Neuromuscular Re-education: Manual Therapy:  STM/tennis ball to glute musculature (pt in s/l)  Modalities: ionto 4 hr patch with dexamethasone, to lateral R hip/gr troch.  Self Care:   PATIENT EDUCATION:  Education details: updated and reviewed HEP Person educated: Patient Education method: Explanation, Demonstration, Tactile cues, Verbal cues, and Handouts Education comprehension: verbalized understanding, returned demonstration, verbal cues required, tactile cues required, and needs further education   HOME EXERCISE PROGRAM: Access Code: [email or web address] URL: [email or web address] Date: 07/23/2022 Prepared by: Sedalia Muta  Exercises - Supine Lower Trunk Rotation  - 2 x daily - 10 reps - 5 hold - Supine March  - 2 x daily - 2 sets - 10 reps - Sidelying Hip Abduction  - 1 x daily - 1- 2 sets - 10 reps - Supine Bridge  - 1 x daily - 1-2 sets - 10 reps - 3 hold   ASSESSMENT:  CLINICAL IMPRESSION: Pt able to perform light strengthening today without increased pain. Updated HEP to include. She continues to have tenderness in gr troch and surrounding muscles, addressed with STM today.   Eval: Patient presents with primary complaint of increased pain in R hip, consistent with bursitis. She has tenderness in gr trochanter as well as weakness in both hips. She has decreased ability for full functional activities due to pain. She will benefit from skilled PT to improve deficits and pain.    OBJECTIVE  IMPAIRMENTS: decreased activity tolerance, decreased mobility, decreased strength, increased muscle spasms, improper body mechanics, and pain.   ACTIVITY LIMITATIONS: bending, standing, squatting, stairs, transfers, and locomotion level  PARTICIPATION LIMITATIONS: cleaning, laundry, shopping, and community activity  PERSONAL FACTORS:  none  are also affecting patient's functional outcome.   REHAB POTENTIAL: Good  CLINICAL DECISION MAKING: Stable/uncomplicated  EVALUATION COMPLEXITY: Low   GOALS: Goals reviewed with patient? Yes  SHORT TERM GOALS: Target date: 08/06/2022   Pt to be independent with initial HEP  Goal status: INITIAL    LONG TERM GOALS: Target date: 09/17/2022  Pt to be independent with final HEP  Goal status: INITIAL  2.  Pt to report decreased pain in R hip to 0-2/10 with standing activity, walking and transfers.   Goal status: INITIAL  3.  Pt to demo improved strength of bil hips to at least 4/5 to improve stability and pain.   Goal status: INITIAL  4.  Pt to demo safe ability for navigating at least 5 steps, without hand rail, to improve ability for community navigation.   Goal status: INITIAL   PLAN:  PT FREQUENCY: 1-2x/week  PT DURATION: 8 weeks  PLANNED INTERVENTIONS: Therapeutic exercises, Therapeutic activity, Neuromuscular re-education, Patient/Family education, Self Care, Joint mobilization, Joint manipulation, Stair training, Orthotic/Fit training, DME instructions, Aquatic Therapy, Dry Needling, Electrical stimulation, Cryotherapy, Moist heat, Taping, Ultrasound, Ionotophoresis 4mg /ml Dexamethasone, Manual therapy,  Vasopneumatic device, Traction, Spinal manipulation, Spinal mobilization,Balance training, Gait training,   PLAN FOR NEXT SESSION: Hip strength, stairs/pain, ionto  Sedalia Muta, PT, DPT 11:57 AM  07/26/22

## 2022-07-30 DIAGNOSIS — F332 Major depressive disorder, recurrent severe without psychotic features: Secondary | ICD-10-CM | POA: Diagnosis not present

## 2022-07-31 ENCOUNTER — Ambulatory Visit (INDEPENDENT_AMBULATORY_CARE_PROVIDER_SITE_OTHER): Payer: BC Managed Care – PPO | Admitting: Physical Therapy

## 2022-07-31 ENCOUNTER — Encounter: Payer: Self-pay | Admitting: Physical Therapy

## 2022-07-31 DIAGNOSIS — F332 Major depressive disorder, recurrent severe without psychotic features: Secondary | ICD-10-CM | POA: Diagnosis not present

## 2022-07-31 DIAGNOSIS — M25551 Pain in right hip: Secondary | ICD-10-CM | POA: Diagnosis not present

## 2022-07-31 NOTE — Therapy (Signed)
OUTPATIENT PHYSICAL THERAPY LOWER EXTREMITY TREATMENT    Patient Name: Deborah Soto MRN: [id number] DOB:[birth year], 57 y.o., female Today's Date: 07/31/2022  END OF SESSION:  PT End of Session - 07/31/22 1311     Visit Number 3    Number of Visits 16    Date for PT Re-Evaluation 09/17/22    Authorization Type BCBS    PT Start Time 1310    PT Stop Time 1348    PT Time Calculation (min) 38 min    Activity Tolerance Patient tolerated treatment well    Behavior During Therapy [facility] for tasks assessed/performed             Past Medical History:  Diagnosis Date   Anemia    Anxiety    Asthma    Depression    Diabetes mellitus type 2 in obese 06/11/2016   Dyspnea    Embolism - blood clot January 1997 & January 2017   Also had LLE DVT in 1997   History of kidney stones    Hypertension    Preeclampsia 10/10/2011   1999    Sleep apnea    Past Surgical History:  Procedure Laterality Date   ANTERIOR CERVICAL DECOMPRESSION/DISCECTOMY FUSION 4 LEVELS N/A 08/17/2020   Procedure: ANTERIOR CERVICAL DECOMPRESSION FUSION CERVICAL THREE - CERVICAL FOUR, CERVICAL FOUR- CERVICAL FIVE, CERVICAL FIVE- CERVICAL SIX WITH INSTRUMENTATION AND ALLOGRAFT;  Surgeon: Estill Bamberg, MD;  Location: [facility] OR;  Service: Orthopedics;  Laterality: N/A;   KIDNEY STONE SURGERY     Patient Active Problem List   Diagnosis Date Noted   Radiculopathy 08/17/2020   Rash 07/28/2020   Vitamin D deficiency 05/03/2020   Neuropathy 04/06/2020   S/P ECT (electroconvulsive therapy) 06/02/2019   Memory loss due to medical condition 06/02/2019   Intention tremor 06/02/2019   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   OSA on CPAP 01/26/2019   Long term use of drug 10/08/2018   Insomnia 10/08/2018   Anxiety 10/08/2018   Tremors of nervous system 10/08/2018   Acne 01/10/2018   Primary hypercoagulable state (HCC) 10/10/2017   Vitamin B12 deficiency  03/18/2017   Diffuse pain 02/22/2017   Morbid obesity (HCC) 06/22/2016   MDD (major depressive disorder), recurrent severe, without psychosis (HCC) 06/14/2016   Diabetes mellitus type 2 in obese 06/11/2016   Positive ANA (antinuclear antibody) 04/02/2016   Hyperparathyroidism (HCC) 01/09/2016   Cervical disc disorder with radiculopathy of cervical region 05/12/2015   Hepatic steatosis 04/18/2015   Achilles tendonitis 04/08/2012   PTE (pulmonary thromboembolism) (HCC) 10/10/2011   Affective disorder (HCC) 10/10/2011   AR (allergic rhinitis) 10/10/2011    PCP: Jacquiline Doe  REFERRING PROVIDER: [name]   REFERRING DIAG: R hip pain   THERAPY DIAG:  Pain in right hip  Rationale for Evaluation and Treatment: Rehabilitation  ONSET DATE:   SUBJECTIVE:   SUBJECTIVE STATEMENT: Pt states continued soreness in hip, but thinks it is improving some.    PERTINENT HISTORY:DM, HTN,    PAIN:  Are you having pain? Yes: NPRS scale: 6-7 /10 Pain location: R hip Pain description: sore Aggravating factors: initial standing,  Relieving factors: none stated   PRECAUTIONS: None  WEIGHT BEARING RESTRICTIONS: No  FALLS:  Has patient fallen in last 6 months? No   PLOF: Independent  PATIENT GOALS: decreased pain   NEXT MD VISIT:   OBJECTIVE:   DIAGNOSTIC FINDINGS:   PATIENT SURVEYS:   FOTO:  46  COGNITION: Overall cognitive  status: Within functional limits for tasks assessed     SENSATION:   EDEMA: none   POSTURE: rounded shoulders and forward head  PALPATION:  tenderness to R gr troch, and into surrounding glute musculature    LOWER EXTREMITY ROM:  Hip ROM: WFL Knees: WFL  LOWER EXTREMITY MMT:  MMT Right eval Left eval  Hip flexion 4- 4-  Hip extension    Hip abduction 4- 4-  Hip adduction    Hip internal rotation    Hip external rotation    Knee flexion 4 4  Knee extension 4 4  Ankle dorsiflexion    Ankle plantarflexion    Ankle inversion     Ankle eversion     (Blank rows = not tested)  LOWER EXTREMITY SPECIAL TESTS:   GAIT:  unremarkable     TODAY'S TREATMENT:                                                                                                                              DATE:   07/31/22 Therapeutic Exercise: Aerobic: Supine:  pelvic tilts  ;   Clams blue TB x 20;  Supine march with TA x 20;  Bridging  x 10; then x 5;  Seated:  Sit to stand from higher mat table 3 x 5 hands on legs;  S/L: Hip abd 2 x 10 bil;  Standing: Hip abd and ext 2 x 5 ea bil;  Stretches:  LTR x 20; SKTC for back stretch  Neuromuscular Re-education: Manual Therapy:  STM/tennis ball to glute musculature (pt in s/l)  Modalities: ionto 4 hr patch with dexamethasone, to lateral R hip/gr troch.  Self Care:   PATIENT EDUCATION:  Education details: updated and reviewed HEP Person educated: Patient Education method: Explanation, Demonstration, Tactile cues, Verbal cues, and Handouts Education comprehension: verbalized understanding, returned demonstration, verbal cues required, tactile cues required, and needs further education   HOME EXERCISE PROGRAM: Access Code: [email or web address]   ASSESSMENT:  CLINICAL IMPRESSION: Pt able to perform light strengthening today without increased pain. More difficulty with standing strength due to mild soreness on standing leg. Will progress standing strength as tolerated.   Eval: Patient presents with primary complaint of increased pain in R hip, consistent with bursitis. She has tenderness in gr trochanter as well as weakness in both hips. She has decreased ability for full functional activities due to pain. She will benefit from skilled PT to improve deficits and pain.    OBJECTIVE IMPAIRMENTS: decreased activity tolerance, decreased mobility, decreased strength, increased muscle spasms, improper body mechanics, and pain.   ACTIVITY LIMITATIONS: bending, standing, squatting, stairs, transfers, and  locomotion level  PARTICIPATION LIMITATIONS: cleaning, laundry, shopping, and community activity  PERSONAL FACTORS:  none  are also affecting patient's functional outcome.   REHAB POTENTIAL: Good  CLINICAL DECISION MAKING: Stable/uncomplicated  EVALUATION COMPLEXITY: Low   GOALS: Goals reviewed with patient? Yes  SHORT TERM GOALS: Target date: 08/06/2022   Pt  to be independent with initial HEP  Goal status: INITIAL    LONG TERM GOALS: Target date: 09/17/2022  Pt to be independent with final HEP  Goal status: INITIAL  2.  Pt to report decreased pain in R hip to 0-2/10 with standing activity, walking and transfers.   Goal status: INITIAL  3.  Pt to demo improved strength of bil hips to at least 4/5 to improve stability and pain.   Goal status: INITIAL  4.  Pt to demo safe ability for navigating at least 5 steps, without hand rail, to improve ability for community navigation.   Goal status: INITIAL   PLAN:  PT FREQUENCY: 1-2x/week  PT DURATION: 8 weeks  PLANNED INTERVENTIONS: Therapeutic exercises, Therapeutic activity, Neuromuscular re-education, Patient/Family education, Self Care, Joint mobilization, Joint manipulation, Stair training, Orthotic/Fit training, DME instructions, Aquatic Therapy, Dry Needling, Electrical stimulation, Cryotherapy, Moist heat, Taping, Ultrasound, Ionotophoresis 4mg /ml Dexamethasone, Manual therapy,  Vasopneumatic device, Traction, Spinal manipulation, Spinal mobilization,Balance training, Gait training,   PLAN FOR NEXT SESSION: Hip strength, stairs/pain, ionto,  standing back stretch, standing hip strength.   Sedalia Muta, PT, DPT 1:11 PM  07/31/22

## 2022-08-01 ENCOUNTER — Ambulatory Visit: Payer: BC Managed Care – PPO | Admitting: Psychiatry

## 2022-08-01 ENCOUNTER — Encounter: Payer: Self-pay | Admitting: Psychiatry

## 2022-08-01 DIAGNOSIS — G4733 Obstructive sleep apnea (adult) (pediatric): Secondary | ICD-10-CM | POA: Diagnosis not present

## 2022-08-01 DIAGNOSIS — R52 Pain, unspecified: Secondary | ICD-10-CM

## 2022-08-01 DIAGNOSIS — F339 Major depressive disorder, recurrent, unspecified: Secondary | ICD-10-CM | POA: Diagnosis not present

## 2022-08-01 DIAGNOSIS — G251 Drug-induced tremor: Secondary | ICD-10-CM

## 2022-08-01 DIAGNOSIS — F411 Generalized anxiety disorder: Secondary | ICD-10-CM | POA: Diagnosis not present

## 2022-08-01 DIAGNOSIS — G4710 Hypersomnia, unspecified: Secondary | ICD-10-CM

## 2022-08-01 DIAGNOSIS — Z79899 Other long term (current) drug therapy: Secondary | ICD-10-CM

## 2022-08-01 DIAGNOSIS — R7989 Other specified abnormal findings of blood chemistry: Secondary | ICD-10-CM

## 2022-08-01 MED ORDER — AUVELITY 45-105 MG PO TBCR
1.0000 | EXTENDED_RELEASE_TABLET | Freq: Two times a day (BID) | ORAL | 3 refills | Status: DC
Start: 2022-08-01 — End: 2022-08-24

## 2022-08-01 MED ORDER — PROPRANOLOL HCL ER 160 MG PO CP24: 160.0000 mg | ORAL_CAPSULE | Freq: Every day | ORAL | 0 refills | Status: DC

## 2022-08-01 MED ORDER — LITHIUM CARBONATE ER 450 MG PO TBCR: 675.0000 mg | EXTENDED_RELEASE_TABLET | Freq: Every day | ORAL | 1 refills | Status: DC

## 2022-08-01 NOTE — Progress Notes (Signed)
Deborah Soto [id number] 05/26/1965 57 y.o.     Subjective:   Patient ID:  Deborah Soto is a 57 y.o. (DOB 04/17/1965) female.  Chief Complaint:  Chief Complaint  Patient presents with   Follow-up   Depression   Anxiety   Depression        Associated symptoms include fatigue and myalgias.  Associated symptoms include no decreased concentration.   Deborah Soto presents to the office today for follow-up of TRD and anxiety, and insomnia.  seen December 08, 2018.  She was complaining of lithium tremor and some cognitive problems.  Hydroxyzine was stopped.  Propranolol increased to 60 mg twice daily for tremor.  Lithium level and amitriptyline levels were requested. Amitriptyline level to 37, nortriptyline 49 on amitriptyline 250 mg nightly. Lithium level 0.800 mg daily  Phone call on October 6 to discuss lab results as follows: Note   ----- Message from [name]., MD sent at 12/15/2018 10:08 AM EDT ----- Lithium level stable at 0.8 in a good range.  The previous was 0.7.  She is having some tremor issues.   Normal BMP including excellent creatinine and calcium levels.  Blood sugar is high but she is aware of that problem.   TSH is within normal limits.   Serum amitriptyline level is 286 which is at the upper end of the normal range and suggestive that we not try further increase.  We will discuss further options at her follow-up appointment.   No medication adjustments required, but because of her tremor and her lithium level is slightly higher than it was with the prior level if she wants to reduce the lithium from 3 of the 300 mg tablets daily to 2-1/2 of the 300 mg tablets daily she can do so.  At her last appointment she was also encouraged to try a higher dose of propranolol and that may have resolved the problem.  If it did do not reduce the lithium but if it did not and she wants to reduce the dose she can do so as noted.  She should call us if she has any  recurrence of symptoms.   [name], [name]     She had repeat lithium level at 750 mg daily of 0.  7 with some improvement in tremor.  In phone call on November 20 she reported mood was worse with weepiness, overreacting, "pity party".  Because mood symptoms were worse with the reduction lithium she was encouraged to increase the dosage back to 900 mg daily.   Last seen February 04, 2019.  The following changes were made: Increase propranolol to 80 mg twice daily for lithium tremor and anxiety her blood pressure and pulse appear high enough that she can tolerate this dosage.  Disc Se.  Disc risk with low BP and DM.  He has not been having any problems with low blood sugar. Amitriptyline 6 or 300 mg daily if tolerated.  Try to take some about 4 -5 hours before sleep Try to take lithium also 4-5 hours before sleep to minimize daytime tremor Add methylphenidate ER 27 mg in the morning.  If no response we will increase the dosage.  Seen May 08, 2019 with her husband. Aside from physically feeling like she can't do much then having memory issues.  Forgetful.  Loses track of thoughts between tasks.  Most embarrassing being around people and word-finding issues.   Problems with walking bc balance and weakness.  Fears falling. Only fall at  Xmas morning.  Tremor is awful.  Affects keying.   H notices cognitive problems and forgetfulness.    Shaking got really bad and got lithium level as low as 600 mg but the next day felt really helpless and everything seemed to be aimed at hurting me and felt disrespected.  Problems with boss and 57 yo taking 5 AP classes and 2 volleyball classes.  I've been on her to accomplish all these things.  She claimed that pt screaming at her.   H CO she was more irritable after the reduction in lithium.   H CO her anxiety also seems worse.  H says the last year has been unhealthy with 12 hour work days for her.   Doesn't go out much.    Attention problems more  noticeable.  Still dropping and tremor.  Tolerated the increase in propranolol.    CO shakiness from lithium which interferes with typing. Also a good bit of anxiety generally without panic.  BS are high.  NotTaking hydroxyzine.   sleeping better and still using CPAP.  Using CPAP regularly. Less awakening than before.  No SE. depression was worse after reducing the lithium.  Irritability was also worse after reducing the lithium to 600 mg daily.  seen May 08, 2019.  Multiple changes made:  Reduce propranolol to 60 mg twice daily for lithium tremor and anxiety since the increase was not helpful.   Restart B6 for lithium tremor. 500 mg BID. She forgot and doesn't want more. Try to take lithium also 4-5 hours before sleep to minimize daytime tremor. depression was worse after reducing the lithium.  Irritability was also worse after reducing the lithium to 600 mg daily. So reluctant to reduce it further.  It is unclear how to address the benefits of lithium without using lithium other than to consider Spravato  Increase methylphenidate ER 36 mg in the morning. Increase methylphenidate ER 36 mg in the morning.  Wait 1-2 weeks then reduce amitriptyline to 5 tablets nightly to see if cognition is better. Also saw [name] re: sleep disorder.  05/2019 appt with the following noted: Lost Rx of Concerta 36 so didn't take it long.  Out of it.  Still shakey and cognitive problems.  Dep 5/10/  Anxiety 6/10 worse at night.  No SI.  8 hours sleep.  Mild panic couple times daily. Frustrated with tremor and sleepiness which is worse than tiredness.   History of low vitamin D taking 150K/week but stopped 6 mos ago. Plan : Modafinil 100 mg tablet 1 dailly for 1 week, Then add Concerta 36 mg 1 each AM.  Option increase modafinil mid April  Restart B6 for lithium tremor. 500 mg BID. Get lab test at earliest convience  10/14/2019 appointment with the following noted: Has been on amitriptyline 250 mg for  mos.  Tried a week ago reducing to 200 and felt worse so back on 250. Weaned off Lyrica about a month ago.  Not much change off it.   Not sure if it helped energy or alertness. Saw Dr. Nonah Mattes in consultation. Mental clarity is a lot better with stimulants and not sleeping as much in daytime with current meds.  Severe drowsiness is a lot better.   Off metformin and added Glipizide.   Not great with depression 5/10.  Anxiety can trigger problems with thinking and somatic sx   I feel like it's something physical and can keep her up at night. Occ racing heart or feeling hot.  Caffeine variable.  Plan:Plan: Increase methylphenidate from 36 to 54 mg in the morning. Increase amitriptyline to 6 tablets in the evening    11/12/19 TC noting call: Pt called stated she's not feeling well at all. Ask for sooner apt than 10/7. Depression, anxiety & focus is much worse. Has not planned to hurt herself but thoughts of things would be better if I was not here have crossed her mind often. Contact ASAP @ [phone].  I put Pt on canc list  MD response: I reviewed the meds and previous med lists.  The next options are either Spravato or a major med change from amitriptyline which is not something I can do outside of an appt.  She's on cancellation list and we'll try to work her in sooner.  Nothing I can change right now except since the increase in amitriptyline has not helped, she can reduce it to 200 mg nightly in preparation for other changes to come. Nursing response after talking with pt: Discussed symptoms and medications with patient, she did not increase her Amitriptyline 50 mg up to 6 tablets so she will start that today. Said you may have told her that but she didn't remember. She has read about Spravato but she is not wanting to proceed with that. Informed her she's on cancellation list as well. She will call back with worsening signs or symptoms.  11/23/19 appt with the following noted: Forgot to increase  amitriptyline until noted. Tolerating it OK but hasn't had time to help. More alert with Concerta increase to 54 mg daily also with modafinil.  A little more focused and better productivity.  Still forgetful.   No SE noted except GI issues from diarrhea to constipation.   Still miserable and cry easily.  Try to numb herself.  Always tired especially emotionally.  Anxiety is high and predates current stimulants. No therapy since [facility].  She felt he pushed too hard for her to make changes.  I don't feel like I can deal with him right now. Plan: Continue meds & The increase in amitriptyline to 300 mg daily needs more time.  01/12/2020 appointment with the following noted: Not well.  Mood horrible.  Easily frustrated sometimes without reason and may nap or cry.  Anxiety is pretty bad.  Daytime sleepiness seems worse.  Can get nausea from anxiety.  Feels like function is slow.  Have to write everything down.  Hates home, job, high school volleyball program.  At some point I have to take responsibility.   Amitriptyline also not helping pain any.  Clenching jaw for 3 weeks. Likcking lips.  Picking fingers. D will play volleyball at [facility] and [place]. Plan: Reduce amitriptyline by 1 tablet every 4 days. Start Viibryd 10 mg daily with food for 7 days, then 20 mg daily for 7 days, then increase to 40 mg daily.  03/18/20 appt urgently made and noted:  Seen with husband. Still with jaw clenching day and night despite off stimulant. Now on Viibryd 60 mg since 03/02/20 and no stimulant., lithium 900 mg, and propranolol 80 BID. Rare hydroxyzine DT sleepiness, Gabapentin 300 TID. Not taking it with food.  LT sensitivity to noise and light and overstimulated and now bothering her more at work.  Always had to use shirts without tags and sensitivity to smells. Easily agitated in public per H.  Gets negative and wants to give up and not live anymore.  She says there's less chance of acting on it if she tells  H about it and she's doing so now.  Pain worse off amitriptyline esp around ribs and general diffuse pain.  Anxiety worse with stimulants and hasn't gone away.  Trouble staying asleep. Plan: Need to take Viibryd 60 with food emphasized.  She's not currently doing so. Restart low-dose amitriptyline for pain  04/22/2020 appointment with following noted: Only restarted amitriptyline 50 mg last week.  Stays asleep better with it.  Sleep 8-9 hours now but no change in pain yet.  Sleepier in day recently. Done a lot of travel lately. Maybe viibryd making a little difference with mood and anxiety now that has been on it longer.  Needs to take Viibryd in AM bc seems to interfere with sleep. Final season of travel volleyball with good team. H notes still some irritability. Pain worse off amitriptyline.  Can reduce to  amitriptyline 25 for pain if 50 mg remains too sedating.  Is sleeping better.      06/21/2020 appointment with following noted:  Seen with H Tolerating amitriptyline 50 mg HS with decent sleep. Really crappy and as bad as 10 years.  Situationally life sucks.  Work environment is bad with coworkers.  Since Sept work is bad.  Doesn't feel people are supportive at home or work. H working 12 hour days.  When she's off wants to sleep all day.   Still in therapy with Thayer Ohm.  Is good therapist. 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  Check lithium level Referred to Dr. Evelene Croon for [facility]  11/03/2020 appointment with the following noted: 57 yo D got Covid but is OK. Still doing Spravato 84 mg twice weekly and plans to continue for awhile. And for a total of 3 mos.  Tolerated well.  It's a nice feeling.   Didn't seem to help the depression for a couple of months.   It does feel different. SI has resolved lately which is particularly impressive given she  has a lot of free time. She has lot s of time with nothing to do bc quit her job. Job environment got worse and worse.  Had been there 9 years.   Doing PT for pain in her back.  Ongoing physical issues.   Mood clearly better with Spravato but not resolved.   Neck fusion recently. Plan: check lithium level Reduce olanzapine to 5 mg HS  12/30/2020 appointment with the following noted: Surges of anxious feelings in her chest without heart racing several times daily last 15 mins and without pcpt.  Stay a little on edge all the time.   Left job and too much time on her hands. 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  She's doing well statistically.  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 Spravato twice weekly.  Is better with it but still some mild cycling of depression.  Still upset over leaving work.  SI very rare and fleeting.  Had a really good plan but doesn't dwell on the plan. Lithium level 0.7 on 900 mg HS. Reduced olanzapine to 5 mg HS and has felt a little worse. Viibryd 60 mg daily. Amitrptyline 50 for pain.  Not sure if it helps.  PT helped a lot with tension in neck and down spine. Sleeping pretty well after 8 hours. Plan increase olanzapine back to 10 mg nightly for treatment resistant depression and anxiety.  Anxiety was worse after  reducing it and no symptoms were improved after reducing it. Try to stop amitriptyline because it is probably not needed in the presence of olanzapine. Check lithium level  03/14/2021 appointment noted: Increase olanzapine to 10 mg for anxiety spells that are unexpected and hit her in the chest esp in the morning. Very low right now.  Twice weekly Spravato and felt good for awhile.  Maybe a slump after Xmas.  Not much social interaction since she quit her job.  Xmas gave her focus.  Doesn't have this now.   Mark working 14-16 hours daily.  This won't get better. Kids are doing great but not around Sleep well generally and that is a benefit of  olanzapine. Tolerates it well.   Plan: Trial increase olanzapine to 15 mg HS  05/03/2021 appointment with the following noted: Still getting Spravato twice weekly.   I love it.  Totally relaxed Depression and anxiety SI is gone or fleeting with Spravato. Still in therapy with Anson Fret and thinks a lot of stuff go back to childhood.  Was always criticized for normal childhood mistakes.  So coped by overcompensating with going big and grand. Can't get energy up to do anything. Never felt she had a manic periods except overstimulated CC anxious feeling in body tingling and tight uncomfortable feeling.  Reminds her of stimulant effects but less intense and worse in AM. Everything still seems to hard like going to grocery store and worry over messing up cooking. Watches too much TV For a little while increase olanzapine seemed to help.  Tolerating it. Depression is not that bad compared to the past. Plan: no med changes  07/17/21 appt noted:  seen with H Depression with twice weekly Spravato. But a lot of general anxiety and then spells of getting tingly and nervous.  Not triggers.   Using propranolol BID. Anxiety is continuation of the way it was before.  Doesn't seem to vary with time of day.  Awakens with anxiety. No SE noted except sleepy.  Sleeping at least 10-11 hours.  Not obviously worse with increase olanzapine.  Viibryd in AM bc insomnia otherwise. Balance trouble with hunched walk and she feels like legs are waker than they should be. Plan: Stop regular propranolol and start ER propranolol ER 1 at night for anxiety Reduce clonidine to 1/2 tablet twice daily Wait 1 week and reduce olanzapine to 1/2 tablet nightly to see if energy, alertness and balance are better. Starting June 1 stop clonidine.  09/05/21 appt noted: Still low energy.  No changes with less meds. Anxiety a little better last week for unclear reasons. She feels olanzapine helps sleep and doesn't want to stop it.   Sleep a lot.  11-11 if she can. Therapist working with her on childhood trauma. Long period of depression. Plan: Start Auvelity 1 daily for a week then 1 twice daily  11/27/21 appt noted:  Couple of phone calls and tremor problems and needed to reduce lithium to 1 and 1/2 tablets and it helped reduce the tremor. Maybe a little improvement.  Not so depressed but kind of numb.   Spells of anxiety but a little better.  Can feel it in chest and arms at times every other night.   Continues spravato twice weekly.   Sleep is great. Off clonidne.  Contiues Aubelity twice daily, lithium 675 mg daily, olanzapine 5 mg HS, viibryd 60 mg daily. Sleep from 11-9 and sleeps well. Plan: Trial reduction in olanzapine to 2.5 mg nightly.  02/14/22 appt noted: Noticed FM got better in summer and then relapsed.  Balance worth with FM also.   Did reduce Spravato from twice weekly to weekly in October.  Did it twice weekly for a year now.  Thinks maybe that is why FM flared.  Continues to weekly. No SI in over a years.  Better dresssed and using makeup now.  Wasn't for about 5 years.. Getting closer to feeling normal .  Depression is still good. Reduced and increased gabapentin to 900 BID. No other med changes except baclofen prn which is marginally helpful. Feels better and got motivated to buy a car.  She's more active and less depressed than she has been.  Gets showered and dress daily before leaving [place]. Less numb emotionally than when last here.  Some excitement. Asks about coming off the Auvelity to reduce meds. A1C is 5.   Sleep is better and using CPAP Plan: no med change.  Continue Auvelity 1 twice daily, continue lithium CR 450 mg tablets 2 nightly, continue olanzapine 2.5 mg nightly, continue propranolol ER 160 mg daily, continue Viibryd 60 mg daily.  05/16/22 appt noted: Meds as above.  Note she was prescribed Mounjaro. switched from gabapentin to Lyrica 150 mg twice daily,  Missed Auvelity 7 days  and felt better when restarted it and I think it helps. Spravato twice weekly early Jan hoping to help pain but didn't. Still dealing with chronic pain esp around abd but also general aches.  Lyrica has helped some No SE Doing well other than pain.  Had a dip in mood late DEC into early Feb without SI but not interested but better now. Interest in cookie decorating and to make for Maggie's wedding in next year. Plan no changes  08/01/22 appt noted: Psych med: Auvelity BID, lithium CR 675 mg nightly, olanzapine 2.5 mg nightly, propranolol ER 160 mg daily, Viibryd 60 mg daily, vitamin D 82956 units weekly.  Also on pregabalin 150 mg every morning and 300 mg nightly for fibromyalgia pain.  Spravato twice weekly. Doing pretty well and better than usual.   Still in therapy with [facility].  Dealt with feelings worthlessness out of childhood.  Parents were very critical.  Has let a lot of this go. This is best I've felt in a long time.   Tolerating meds. New hobby is baking.   Going to see M in [place] in July which could be triggering.  Sister is there too and much like her. Sister can be critical too.  Her kids are going too.   Had a nice mother's Day affirming.  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   Sleep is good.  ECT-MADRS    Flowsheet Row Office Visit from 05/03/2021 in [facility]  MADRS Total Score 32      PHQ2-9    Flowsheet Row Office Visit from 03/22/2022 in  [facility] from 02/09/2022 in [facility] from 01/15/2022 in [facility] [facility] from 10/13/2021 in [facility] from 10/03/2021 in [facility]  PHQ-2 Total Score 2 0 3 4 6   PHQ-9 Total Score 11 0 10 11 19       Flowsheet Row Admission (Discharged) from 08/17/2020 in [facility]  Pre-Admission Testing 62 from 08/16/2020 in [facility] PREADMISSION TESTING  C-SSRS RISK CATEGORY No Risk No Risk  Maggie 8 and dating.  Past Psychiatric Medication Trials:  Failed ECT and TMS,  Spravato started 07/2020 amitriptyline helped for 2 to 3 years in 1987,   2021 No benefit from amitriptyline 300 mg with adequate duration.  Nortriptyline 200 with a therapeutic blood level. Trintellix, duloxetine 120, Lexapro 20, Fetzima,  Wellbutrin, fluoxetine, paroxetine, sertraline,  (Poor response to SSRIs) Viibryd 60 [place],   lithium SE,  Latuda 120, Abilify 15,  Vraylar, Rexulti,Seroquel 400, risperidone, Geodon,  Haldol for agitation, lamotrigine 300,  buspirone, clonazepam, gabapentin, Propranolol, clonidine 0.1 mg BID modafinil,  Nuvigil, Vyvanse, Concerta pramipexole,  SHE DOESN'T WANT STIMULANTS AGAIN bc of anxiety. Trazodone,   GENESIGHT COMPLETED  history of overdose on Xanax, There is a history of suicide attempts and psychiatric hospitalizations.  New PCP good Jacquiline Doe   Review of Systems:  Review of Systems  Constitutional:  Positive for fatigue. unchanged Cardiovascular:  Negative for chest pain and palpitations.  Gastrointestinal:  Negative for diarrhea.  Musculoskeletal:  Positive for back pain and myalgias. Negative for gait problem.       Less pain lately.  Neurological:  Positive for mild tremors .  Negative for dizziness.  Psychiatric/Behavioral:  Positive for dysphoric mood. Negative for confusion, decreased concentration, hallucinations and self-injury. The patient is nervous/anxious.     Medications: I have reviewed the patient's current medications.  Current Outpatient Medications  Medication Sig Dispense Refill   celecoxib (CELEBREX) 200 MG capsule TAKE ONE CAPSULE TWICE DAILY FOR chronic pain 180 capsule 0   Continuous Blood Gluc Receiver (DEXCOM G7 RECEIVER) DEVI Use 4 times daily to check blood sugar 1 each 11    Continuous Glucose Sensor (DEXCOM G7 SENSOR) MISC USE AS DIRECTED FOUR TIMES DAILY TO CHECK BLOOD SUGAR 1 each 11   Empagliflozin-metFORMIN HCl ER (SYNJARDY XR) 25-1000 MG TB24 Take 1 tablet by mouth daily.     Esketamine HCl, 84 MG Dose, (SPRAVATO, 84 MG DOSE,) 28 MG/DEVICE SOPK Place 84 mg into the nose See admin instructions. Use twice weekly     Ginger, Zingiber officinalis, (GINGER PO) Take by mouth.     ipratropium (ATROVENT) 0.03 % nasal spray SMARTSIG:2 Spray(s) Both Nares 3 Times Daily PRN     OLANZapine (ZYPREXA) 2.5 MG tablet TAKE ONE TABLET BY MOUTH AT BEDTIME 90 tablet 0   pantoprazole (PROTONIX) 40 MG tablet TAKE ONE TABLET BY MOUTH ONCE DAILY FOR STOMACH PROTECTION 30 tablet 5   pregabalin (LYRICA) 150 MG capsule Take 150mg  in the morning and 300mg  in the evening. 90 capsule 5   Semaglutide, 2 MG/DOSE, 8 MG/3ML SOPN Inject 2 mg as directed once a week. 3 mL 2   SYNJARDY XR 12.07-998 MG TB24 TAKE TWO TABLETS BY MOUTH DAILY 60 tablet 5   tirzepatide (MOUNJARO) 15 MG/0.5ML Pen Inject 15 mg into the skin once a week. 6 mL 11   tretinoin (RETIN-A) 0.05 % cream SMARTSIG:Sparingly Topical Every Night     tretinoin (RETIN-A) 0.1 % cream nightly.     TURMERIC PO Take by mouth.     Vilazodone HCl (VIIBRYD) 40 MG TABS TAKE 1 & 1/2 TABLETS BY MOUTH DAILY 135 tablet 0   Vitamin D, Ergocalciferol, 50000 units CAPS TAKE ONE CAPSULE BY MOUTH EVERY 7 DAYS 15 capsule 3   XARELTO 20 MG TABS tablet TAKE ONE TABLET BY MOUTH DAILY WITH SUPPER 30 tablet 5   Continuous Blood Gluc Receiver (DEXCOM G6 RECEIVER) DEVI Use 4 times daily as needed to check blood sugar (Patient  not taking: Reported on 07/11/2022) 1 each PRN   Continuous Blood Gluc Sensor (DEXCOM G6 SENSOR) MISC Apply topically 4 (four) times daily. (Patient not taking: Reported on 07/11/2022)     Continuous Blood Gluc Transmit (DEXCOM G6 TRANSMITTER) MISC USE AS NEEDED TO CHECK BLOOD SUGAR FOUR TIMES DAILY (Patient not taking: Reported on 07/11/2022)      Dextromethorphan-buPROPion ER (AUVELITY) 45-105 MG TBCR Take 1 tablet by mouth 2 (two) times daily. 60 tablet 3   Insulin Pen Needle (PEN NEEDLES) 33G X 4 MM MISC Use daily as needed to inject insulin (Patient not taking: Reported on 07/11/2022) 100 each 1   lithium carbonate (ESKALITH) 450 MG ER tablet Take 1.5 tablets (675 mg total) by mouth at bedtime. 135 tablet 1   propranolol ER (INDERAL LA) 160 MG SR capsule Take 1 capsule (160 mg total) by mouth daily. 90 capsule 0   No current facility-administered medications for this visit.    Medication Side Effects: Sedation  Allergies:  Allergies  Allergen Reactions   Tramadol Other (See Comments)    Tingling all over    Hydrocodone     Ineffective     Past Medical History:  Diagnosis Date   Anemia    Anxiety    Asthma    Depression    Diabetes mellitus type 2 in obese 06/11/2016   Dyspnea    Embolism - blood clot January 1997 & January 2017   Also had LLE DVT in 1997   History of kidney stones    Hypertension    Preeclampsia 10/10/2011   1999    Sleep apnea     Family History  Problem Relation Age of Onset   Dementia Father    Heart disease Father    Clotting disorder Mother    Arthritis Mother    Clotting disorder Sister    Clotting disorder Maternal Grandmother    Clotting disorder Maternal Aunt    Rheumatologic disease Neg Hx    Hyperparathyroidism Neg Hx     Social History   Socioeconomic History   Marital status: Married    Spouse name: Not on file   Number of children: Y   Years of education: Not on file   Highest education level: Not on file  Occupational History   Occupation: admin assist    Comment: insurance verification  Tobacco Use   Smoking status: Former    Packs/day: 1.00    Years: 20.00    Additional pack years: 0.00    Total pack years: 20.00    Types: Cigarettes    Quit date: 03/12/2004    Years since quitting: 18.4    Passive exposure: Past   Smokeless tobacco: Never  Vaping Use    Vaping Use: Never used  Substance and Sexual Activity   Alcohol use: Yes    Alcohol/week: 1.0 standard drink of alcohol    Types: 1 Cans of beer per week    Comment: 1-2 times a week   Drug use: No   Sexual activity: Yes    Partners: Male    Birth control/protection: None  Other Topics Concern   Not on file  Social History Narrative   Originally from [place]. Always lived in [place]. 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 No international travel recently. Previously has been to [place] in May 1996. No mold exposure recently. No bird exposure. Does have multiple pets.    Social Determinants of Health   Financial Resource Strain: Not on file  Food Insecurity: Not on file  Transportation Needs: Not on file  Physical Activity: Not on file  Stress: Not on file  Social Connections: Not on file  Intimate Partner Violence: Not on file    Past Medical History, Surgical history, Social history, and Family history were reviewed and updated as appropriate.   Please see review of systems for further details on the patient's review from today.   Objective:   Physical Exam:  LMP  (LMP Unknown) Comment: last period ---  1 year ago (?)  Physical Exam Constitutional:      General: She is not in acute distress. Musculoskeletal:        General: No deformity.  Neurological:     Mental Status: She is alert and oriented to person, place, and time.     Cranial Nerves: No dysarthria.     Coordination: Coordination normal.  Psychiatric:        Attention and Perception: Attention and perception normal. She does not perceive auditory or visual hallucinations.        Mood and Affect: Mood is euthymic. Affect is not labile, blunt, angry, tearful or inappropriate.    Not as severe as in the past. Not irritable.    Speech: Speech normal.        Behavior: Behavior normal. Behavior is cooperative.        Thought Content: Thought content normal. Thought content is not paranoid or delusional. Thought  content does not include homicidal or suicidal ideation. Thought content does not include suicidal plan.        Cognition and Memory: Cognition and memory normal.        Judgment: Judgment normal.     Comments: Insight intact    Lab Review:     Component Value Date/Time   NA 141 04/11/2022 1054   NA 138 08/21/2019 0000   NA 140 12/11/2016 1236   K 4.5 04/11/2022 1054   K 4.2 12/11/2016 1236   CL 107 04/11/2022 1054   CO2 24 04/11/2022 1054   CO2 24 12/11/2016 1236   GLUCOSE 126 (H) 04/11/2022 1054   GLUCOSE 109 12/11/2016 1236   BUN 14 04/11/2022 1054   BUN 12 08/21/2019 0000   BUN 15.1 12/11/2016 1236   CREATININE 0.70 04/11/2022 1054   CREATININE 0.68 02/22/2020 1649   CREATININE 0.8 12/11/2016 1236   CALCIUM 9.9 04/11/2022 1054   CALCIUM 10.0 12/11/2016 1236   PROT 7.0 04/11/2022 1054   PROT 7.5 12/11/2016 1236   ALBUMIN 4.9 04/11/2022 1054   ALBUMIN 4.4 12/11/2016 1236   AST 30 04/11/2022 1054   AST 38 (H) 12/11/2016 1236   ALT 36 (H) 04/11/2022 1054   ALT 61 (H) 12/11/2016 1236   ALKPHOS 55 04/11/2022 1054   ALKPHOS 71 12/11/2016 1236   BILITOT 0.5 04/11/2022 1054   BILITOT 0.62 12/11/2016 1236   GFRNONAA >60 08/16/2020 1157   GFRNONAA 105 12/11/2018 1121   GFRAA 122 12/11/2018 1121       Component Value Date/Time   WBC 8.6 04/11/2022 1054   RBC 5.01 04/11/2022 1054   HGB 15.0 04/11/2022 1054   HGB 14.5 12/11/2016 1236   HCT 45.2 04/11/2022 1054   HCT 43.9 12/11/2016 1236   PLT 280.0 04/11/2022 1054   PLT 289 12/11/2016 1236   PLT 295 07/24/2016 1848   MCV 90.1 04/11/2022 1054   MCV 93.2 12/11/2016 1236   MCH 29.9 08/16/2020 1157   MCHC 33.1 04/11/2022 1054   RDW 14.5  04/11/2022 1054   RDW 13.5 12/11/2016 1236   LYMPHSABS 2.2 11/22/2020 1223   LYMPHSABS 2.4 12/11/2016 1236   MONOABS 0.6 11/22/2020 1223   MONOABS 0.7 12/11/2016 1236   EOSABS 0.1 11/22/2020 1223   EOSABS 0.7 (H) 12/11/2016 1236   BASOSABS 0.0 11/22/2020 1223   BASOSABS 0.0  12/11/2016 1236    Lithium Lvl  Date Value Ref Range Status  11/22/2020 0.7 0.6 - 1.2 mmol/L Final   11/22/20  lithium 0.7 on 900   Serum nortriptyline level on 150 mg a day was 109  Amitriptyline  Level 12/11/2018 =237  On 250 mg daily,.  12/23/2020 vitamin D level 31.38, vitamin B12 519  No results found for: "PHENYTOIN", "PHENOBARB", "VALPROATE", "CBMZ"   .res Assessment: Plan:    Recurrent major depression resistant to treatment (HCC) - Plan: Dextromethorphan-buPROPion ER (AUVELITY) 45-105 MG TBCR, lithium carbonate (ESKALITH) 450 MG ER tablet  Generalized anxiety disorder - Plan: propranolol ER (INDERAL LA) 160 MG SR capsule  Hypersomnolence  Obstructive sleep apnea  Diffuse pain  Lithium-induced tremor  Low vitamin D level  Lithium use   Shabnam has chronic severe major depression and generalized anxiety that is treatment resistant and failed multiple medications ECT and TMS as noted above including SSRIs, try cyclic's, lithium, and atypical antipsychotics for depression.. She has had partial benefit from amitriptyline plus lithium.  However  tremor problems from the lithium.  Efforts to reduce the lithium have led to worsening psychiatric symptoms.  There has been a discussion about possible bipolar elements but she has no clear manic episodes unrelated to medication changes.  She has features of atypical depression.  So an MAO inhibitor is attractive from that perspective. Not sig depressed and numbness better.  Good response.  Discussed Spravato option in detail.  She is continuing Spravato.  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  Consider switch and retry pramipexole and push dose. Less numb  Statistically speaking the most potentially beneficial option would be Parnate or Nardil but they are difficult to use   Need to take Viibryd 60 with food emphasized.   check lithium level 0.7 on 900 mg.  Reuced to 675 DT  tremor.  continue Auvelity 1 twice daily BC partial benefit Disc SE.   Her improvement seems to correlate with starting this.  Rec counseling.  She's back seeing Kennon Rounds.. Needs more purpose in her day. She needs a fulfilling job.   Encourage physical activity and weight loss.  She is not particularly motivated and feels that is hard to accomplish because of chronic pain.  Chronic pain complicates her treatment of depression.  Recent B12 deficiency dx and treatment started. vitamin D bc level 31 on 2000U daily  Hold B6 for lithium tremor. 500 mg BID. If needed.  Tremor is better.  None currently.  Try to take lithium also 4-5 hours before sleep to minimize daytime tremor. depression was worse after reducing the lithium.  Irritability was also worse after reducing the lithium to 600 mg daily.   Disc weith loss meds Monjarou recently started for DM and goals.  Last checked lost 17#.  BS is OK.  Has continuous glucose monitor.  Take vitamin D regularly.  No med changes, continue: Auvelity BID, lithium CR 675 mg nightly, olanzapine 2.5 mg nightly, propranolol ER 160 mg daily, Viibryd 60 mg daily, vitamin D 53664 units weekly.  Also on pregabalin 150 mg every morning and 300 mg nightly  for fibromyalgia pain.  Spravato twice weekly.  Follow-up 8 weeks  [name], [name]  Future Appointments  Date Time Provider Department Center  08/02/2022 12:15 PM Sedalia Muta, PT [facility] None  08/09/2022  2:30 PM Sedalia Muta, PT [facility] None  08/13/2022  9:30 AM Sedalia Muta, PT [facility] None  08/16/2022 10:15 AM Sedalia Muta, PT [facility] None  08/20/2022 10:15 AM Sedalia Muta, PT [facility] None  08/23/2022 10:15 AM Sedalia Muta, PT [facility] None  08/30/2022  9:00 AM Rodolph Bong, MD [facility] None    No orders of the defined types were placed in this encounter.      -------------------------------

## 2022-08-02 ENCOUNTER — Ambulatory Visit (INDEPENDENT_AMBULATORY_CARE_PROVIDER_SITE_OTHER): Payer: BC Managed Care – PPO | Admitting: Physical Therapy

## 2022-08-02 ENCOUNTER — Encounter: Payer: Self-pay | Admitting: Physical Therapy

## 2022-08-02 DIAGNOSIS — M25551 Pain in right hip: Secondary | ICD-10-CM

## 2022-08-02 DIAGNOSIS — F332 Major depressive disorder, recurrent severe without psychotic features: Secondary | ICD-10-CM | POA: Diagnosis not present

## 2022-08-02 NOTE — Therapy (Signed)
OUTPATIENT PHYSICAL THERAPY LOWER EXTREMITY TREATMENT    Patient Name: Deborah Soto MRN: [id number] DOB:21-Sep-1965, 57 y.o., female Today's Date: 08/02/2022  END OF SESSION:  PT End of Session - 08/02/22 1216     Visit Number 4    Number of Visits 16    Date for PT Re-Evaluation 09/17/22    Authorization Type BCBS    PT Start Time 1217    PT Stop Time 1300    PT Time Calculation (min) 43 min    Activity Tolerance Patient tolerated treatment well    Behavior During Therapy [facility] for tasks assessed/performed             Past Medical History:  Diagnosis Date   Anemia    Anxiety    Asthma    Depression    Diabetes mellitus type 2 in obese 06/11/2016   Dyspnea    Embolism - blood clot January 1997 & January 2017   Also had LLE DVT in 1997   History of kidney stones    Hypertension    Preeclampsia 10/10/2011   1999    Sleep apnea    Past Surgical History:  Procedure Laterality Date   ANTERIOR CERVICAL DECOMPRESSION/DISCECTOMY FUSION 4 LEVELS N/A 08/17/2020   Procedure: ANTERIOR CERVICAL DECOMPRESSION FUSION CERVICAL THREE - CERVICAL FOUR, CERVICAL FOUR- CERVICAL FIVE, CERVICAL FIVE- CERVICAL SIX WITH INSTRUMENTATION AND ALLOGRAFT;  Surgeon: Estill Bamberg, MD;  Location: [facility] OR;  Service: Orthopedics;  Laterality: N/A;   KIDNEY STONE SURGERY     Patient Active Problem List   Diagnosis Date Noted   Radiculopathy 08/17/2020   Rash 07/28/2020   Vitamin D deficiency 05/03/2020   Neuropathy 04/06/2020   S/P ECT (electroconvulsive therapy) 06/02/2019   Memory loss due to medical condition 06/02/2019   Intention tremor 06/02/2019   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   OSA on CPAP 01/26/2019   Long term use of drug 10/08/2018   Insomnia 10/08/2018   Anxiety 10/08/2018   Tremors of nervous system 10/08/2018   Acne 01/10/2018   Primary hypercoagulable state (HCC) 10/10/2017   Vitamin B12 deficiency  03/18/2017   Diffuse pain 02/22/2017   Morbid obesity (HCC) 06/22/2016   MDD (major depressive disorder), recurrent severe, without psychosis (HCC) 06/14/2016   Diabetes mellitus type 2 in obese 06/11/2016   Positive ANA (antinuclear antibody) 04/02/2016   Hyperparathyroidism (HCC) 01/09/2016   Cervical disc disorder with radiculopathy of cervical region 05/12/2015   Hepatic steatosis 04/18/2015   Achilles tendonitis 04/08/2012   PTE (pulmonary thromboembolism) (HCC) 10/10/2011   Affective disorder (HCC) 10/10/2011   AR (allergic rhinitis) 10/10/2011    PCP: Jacquiline Doe  REFERRING PROVIDER: [name]   REFERRING DIAG: R hip pain   THERAPY DIAG:  Pain in right hip  Rationale for Evaluation and Treatment: Rehabilitation  ONSET DATE:   SUBJECTIVE:   SUBJECTIVE STATEMENT: Pt states hip is improving some.    PERTINENT HISTORY:DM, HTN,    PAIN:  Are you having pain? Yes: NPRS scale: 6-7 /10 Pain location: R hip Pain description: sore Aggravating factors: initial standing,  Relieving factors: none stated   PRECAUTIONS: None  WEIGHT BEARING RESTRICTIONS: No  FALLS:  Has patient fallen in last 6 months? No   PLOF: Independent  PATIENT GOALS: decreased pain   NEXT MD VISIT:   OBJECTIVE:   DIAGNOSTIC FINDINGS:   PATIENT SURVEYS:   FOTO:  64  COGNITION: Overall cognitive status: Within functional limits for tasks  assessed     SENSATION:   EDEMA: none   POSTURE: rounded shoulders and forward head  PALPATION:  tenderness to R gr troch, and into surrounding glute musculature    LOWER EXTREMITY ROM:  Hip ROM: WFL Knees: WFL  LOWER EXTREMITY MMT:  MMT Right eval Left eval  Hip flexion 4- 4-  Hip extension    Hip abduction 4- 4-  Hip adduction    Hip internal rotation    Hip external rotation    Knee flexion 4 4  Knee extension 4 4  Ankle dorsiflexion    Ankle plantarflexion    Ankle inversion    Ankle eversion     (Blank rows = not  tested)  LOWER EXTREMITY SPECIAL TESTS:   GAIT:  unremarkable     TODAY'S TREATMENT:                                                                                                                              DATE:   08/02/22: Therapeutic Exercise: Aerobic: Supine: SLR x 10 bil;  pelvic tilts for back 2 x 10;   Clams blue TB x 20;   Bridging  2 x 10;  Seated: S/L: Hip abd 2 x 10 bil;  Standing: Hip abd and ext 3 x 5 ea bil;  marching no hands x 20; step ups 6 in x 10 ea bil;  5 Stairs- reciprocal, no UE support x 5;  Stretches:  LTR x 20; Cat/cow 2 x 5;  Neuromuscular Re-education: Manual Therapy:  DTM/TPR and IASTM to R hip and glute med Modalities:  Self Care:   07/31/22 Therapeutic Exercise: Aerobic: Supine:  pelvic tilts  ;   Clams blue TB x 20;  Supine march with TA x 20;  Bridging  x 10; then x 5;  Seated:  Sit to stand from higher mat table 3 x 5 hands on legs;  S/L: Hip abd 2 x 10 bil;  Standing: Hip abd and ext 2 x 5 ea bil;  Stretches:  LTR x 20; SKTC for back stretch  Neuromuscular Re-education: Manual Therapy:  STM/tennis ball to glute musculature (pt in s/l)  Modalities: ionto 4 hr patch with dexamethasone, to lateral R hip/gr troch.  Self Care:   PATIENT EDUCATION:  Education details: updated and reviewed HEP Person educated: Patient Education method: Explanation, Demonstration, Tactile cues, Verbal cues, and Handouts Education comprehension: verbalized understanding, returned demonstration, verbal cues required, tactile cues required, and needs further education   HOME EXERCISE PROGRAM: Access Code: [email or web address]   ASSESSMENT:  CLINICAL IMPRESSION: Pt eith improving ability for strength as well as stairs with very little pain today. She is showing good progress. She does have muscle tension and discomfort around gr troch, will continue to benefit from strength and manual as needed.   Eval: Patient presents with primary complaint of increased pain in R  hip, consistent with bursitis. She has tenderness in gr trochanter as well as  weakness in both hips. She has decreased ability for full functional activities due to pain. She will benefit from skilled PT to improve deficits and pain.    OBJECTIVE IMPAIRMENTS: decreased activity tolerance, decreased mobility, decreased strength, increased muscle spasms, improper body mechanics, and pain.   ACTIVITY LIMITATIONS: bending, standing, squatting, stairs, transfers, and locomotion level  PARTICIPATION LIMITATIONS: cleaning, laundry, shopping, and community activity  PERSONAL FACTORS:  none  are also affecting patient's functional outcome.   REHAB POTENTIAL: Good  CLINICAL DECISION MAKING: Stable/uncomplicated  EVALUATION COMPLEXITY: Low   GOALS: Goals reviewed with patient? Yes  SHORT TERM GOALS: Target date: 08/06/2022   Pt to be independent with initial HEP  Goal status: INITIAL    LONG TERM GOALS: Target date: 09/17/2022  Pt to be independent with final HEP  Goal status: INITIAL  2.  Pt to report decreased pain in R hip to 0-2/10 with standing activity, walking and transfers.   Goal status: INITIAL  3.  Pt to demo improved strength of bil hips to at least 4/5 to improve stability and pain.   Goal status: INITIAL  4.  Pt to demo safe ability for navigating at least 5 steps, without hand rail, to improve ability for community navigation.   Goal status: INITIAL   PLAN:  PT FREQUENCY: 1-2x/week  PT DURATION: 8 weeks  PLANNED INTERVENTIONS: Therapeutic exercises, Therapeutic activity, Neuromuscular re-education, Patient/Family education, Self Care, Joint mobilization, Joint manipulation, Stair training, Orthotic/Fit training, DME instructions, Aquatic Therapy, Dry Needling, Electrical stimulation, Cryotherapy, Moist heat, Taping, Ultrasound, Ionotophoresis 4mg /ml Dexamethasone, Manual therapy,  Vasopneumatic device, Traction, Spinal manipulation, Spinal mobilization,Balance  training, Gait training,   PLAN FOR NEXT SESSION: Hip strength, stairs/pain, ionto,  standing back stretch, standing hip strength.   Sedalia Muta, PT, DPT 12:52 PM  08/02/22

## 2022-08-07 DIAGNOSIS — F332 Major depressive disorder, recurrent severe without psychotic features: Secondary | ICD-10-CM | POA: Diagnosis not present

## 2022-08-09 ENCOUNTER — Encounter: Payer: Self-pay | Admitting: Physical Therapy

## 2022-08-09 ENCOUNTER — Ambulatory Visit (INDEPENDENT_AMBULATORY_CARE_PROVIDER_SITE_OTHER): Payer: BC Managed Care – PPO | Admitting: Physical Therapy

## 2022-08-09 DIAGNOSIS — M25551 Pain in right hip: Secondary | ICD-10-CM

## 2022-08-09 NOTE — Therapy (Signed)
OUTPATIENT PHYSICAL THERAPY LOWER EXTREMITY TREATMENT    Patient Name: Deborah Soto MRN: [id number] DOB:03/06/1966, 57 y.o., female Today's Date: 08/09/2022  END OF SESSION:  PT End of Session - 08/09/22 1430     Visit Number 5    Number of Visits 16    Date for PT Re-Evaluation 09/17/22    Authorization Type BCBS    PT Start Time 1431    PT Stop Time 1515    PT Time Calculation (min) 44 min    Activity Tolerance Patient tolerated treatment well    Behavior During Therapy [facility] for tasks assessed/performed             Past Medical History:  Diagnosis Date   Anemia    Anxiety    Asthma    Depression    Diabetes mellitus type 2 in obese 06/11/2016   Dyspnea    Embolism - blood clot January 1997 & January 2017   Also had LLE DVT in 1997   History of kidney stones    Hypertension    Preeclampsia 10/10/2011   1999    Sleep apnea    Past Surgical History:  Procedure Laterality Date   ANTERIOR CERVICAL DECOMPRESSION/DISCECTOMY FUSION 4 LEVELS N/A 08/17/2020   Procedure: ANTERIOR CERVICAL DECOMPRESSION FUSION CERVICAL THREE - CERVICAL FOUR, CERVICAL FOUR- CERVICAL FIVE, CERVICAL FIVE- CERVICAL SIX WITH INSTRUMENTATION AND ALLOGRAFT;  Surgeon: Estill Bamberg, MD;  Location: [facility] OR;  Service: Orthopedics;  Laterality: N/A;   KIDNEY STONE SURGERY     Patient Active Problem List   Diagnosis Date Noted   Radiculopathy 08/17/2020   Rash 07/28/2020   Vitamin D deficiency 05/03/2020   Neuropathy 04/06/2020   S/P ECT (electroconvulsive therapy) 06/02/2019   Memory loss due to medical condition 06/02/2019   Intention tremor 06/02/2019   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   OSA on CPAP 01/26/2019   Long term use of drug 10/08/2018   Insomnia 10/08/2018   Anxiety 10/08/2018   Tremors of nervous system 10/08/2018   Acne 01/10/2018   Primary hypercoagulable state (HCC) 10/10/2017   Vitamin B12 deficiency  03/18/2017   Diffuse pain 02/22/2017   Morbid obesity (HCC) 06/22/2016   MDD (major depressive disorder), recurrent severe, without psychosis (HCC) 06/14/2016   Diabetes mellitus type 2 in obese 06/11/2016   Positive ANA (antinuclear antibody) 04/02/2016   Hyperparathyroidism (HCC) 01/09/2016   Cervical disc disorder with radiculopathy of cervical region 05/12/2015   Hepatic steatosis 04/18/2015   Achilles tendonitis 04/08/2012   PTE (pulmonary thromboembolism) (HCC) 10/10/2011   Affective disorder (HCC) 10/10/2011   AR (allergic rhinitis) 10/10/2011    PCP: Jacquiline Doe  REFERRING PROVIDER: [name]   REFERRING DIAG: R hip pain   THERAPY DIAG:  Pain in right hip  Rationale for Evaluation and Treatment: Rehabilitation  ONSET DATE:   SUBJECTIVE:   SUBJECTIVE STATEMENT: Pt states hip is improving some, but still sore, feels sore with sitting and with initial standing.   PERTINENT HISTORY:DM, HTN,    PAIN:  Are you having pain? Yes: NPRS scale: 6-7 /10 Pain location: R hip Pain description: sore Aggravating factors: initial standing,  Relieving factors: none stated   PRECAUTIONS: None  WEIGHT BEARING RESTRICTIONS: No  FALLS:  Has patient fallen in last 6 months? No   PLOF: Independent  PATIENT GOALS: decreased pain   NEXT MD VISIT:   OBJECTIVE:   DIAGNOSTIC FINDINGS:   PATIENT SURVEYS:   FOTO:  85  COGNITION: Overall cognitive status: Within functional limits for tasks assessed     SENSATION:   EDEMA: none   POSTURE: rounded shoulders and forward head  PALPATION:  tenderness to R gr troch, and into surrounding glute musculature    LOWER EXTREMITY ROM:  Hip ROM: WFL Knees: WFL  LOWER EXTREMITY MMT:  MMT Right eval Left eval  Hip flexion 4- 4-  Hip extension    Hip abduction 4- 4-  Hip adduction    Hip internal rotation    Hip external rotation    Knee flexion 4 4  Knee extension 4 4  Ankle dorsiflexion    Ankle  plantarflexion    Ankle inversion    Ankle eversion     (Blank rows = not tested)  LOWER EXTREMITY SPECIAL TESTS:   GAIT:  unremarkable     TODAY'S TREATMENT:                                                                                                                              DATE:    08/09/22: Therapeutic Exercise: Aerobic:  Bike L1 x 8 min;  Supine: SLR x 10 bil;  pelvic tilts for back 2 x 10;    Bridging  2 x 10;  Seated: S/L: Hip abd 2 x 10 bil;  clams  x 15 bil;  Standing: Hip abd and ext 3 x 5 ea bil;  marching no hands x 20; at wall for posture x 2 min with education on head position  Stretches:  LTR x 20;  Neuromuscular Re-education: Manual Therapy:  DTM/TPR and IASTM to R hip and glute med Modalities:  Self Care:    08/02/22: Therapeutic Exercise: Aerobic: Supine: SLR x 10 bil;  pelvic tilts for back 2 x 10;   Clams blue TB x 20;   Bridging  2 x 10;  Seated: S/L: Hip abd 2 x 10 bil;  Standing: Hip abd and ext 3 x 5 ea bil;  marching no hands x 20; step ups 6 in x 10 ea bil;  5 Stairs- reciprocal, no UE support x 5;  Stretches:  LTR x 20; Cat/cow 2 x 5;  Neuromuscular Re-education: Manual Therapy:  DTM/TPR and IASTM to R hip and glute med Modalities:  Self Care:   07/31/22 Therapeutic Exercise: Aerobic: Supine:  pelvic tilts  ;   Clams blue TB x 20;  Supine march with TA x 20;  Bridging  x 10; then x 5;  Seated:  Sit to stand from higher mat table 3 x 5 hands on legs;  S/L: Hip abd 2 x 10 bil;  Standing: Hip abd and ext 2 x 5 ea bil;  Stretches:  LTR x 20; SKTC for back stretch  Neuromuscular Re-education: Manual Therapy:  STM/tennis ball to glute musculature (pt in s/l)  Modalities: ionto 4 hr patch with dexamethasone, to lateral R hip/gr troch.  Self Care:   PATIENT EDUCATION:  Education details:  updated and reviewed HEP Person educated: Patient Education method: Explanation, Demonstration, Tactile cues, Verbal cues, and Handouts Education  comprehension: verbalized understanding, returned demonstration, verbal cues required, tactile cues required, and needs further education   HOME EXERCISE PROGRAM: Access Code: [email or web address]   ASSESSMENT:  CLINICAL IMPRESSION: Pt progressing well. Still having soreness with sitting and initial standing. She is doing well with strengthening. Will benefit from continued manual as needed and strength for hip.   Eval: Patient presents with primary complaint of increased pain in R hip, consistent with bursitis. She has tenderness in gr trochanter as well as weakness in both hips. She has decreased ability for full functional activities due to pain. She will benefit from skilled PT to improve deficits and pain.    OBJECTIVE IMPAIRMENTS: decreased activity tolerance, decreased mobility, decreased strength, increased muscle spasms, improper body mechanics, and pain.   ACTIVITY LIMITATIONS: bending, standing, squatting, stairs, transfers, and locomotion level  PARTICIPATION LIMITATIONS: cleaning, laundry, shopping, and community activity  PERSONAL FACTORS:  none  are also affecting patient's functional outcome.   REHAB POTENTIAL: Good  CLINICAL DECISION MAKING: Stable/uncomplicated  EVALUATION COMPLEXITY: Low   GOALS: Goals reviewed with patient? Yes  SHORT TERM GOALS: Target date: 08/06/2022   Pt to be independent with initial HEP  Goal status: INITIAL    LONG TERM GOALS: Target date: 09/17/2022  Pt to be independent with final HEP  Goal status: INITIAL  2.  Pt to report decreased pain in R hip to 0-2/10 with standing activity, walking and transfers.   Goal status: INITIAL  3.  Pt to demo improved strength of bil hips to at least 4/5 to improve stability and pain.   Goal status: INITIAL  4.  Pt to demo safe ability for navigating at least 5 steps, without hand rail, to improve ability for community navigation.   Goal status: INITIAL   PLAN:  PT FREQUENCY:  1-2x/week  PT DURATION: 8 weeks  PLANNED INTERVENTIONS: Therapeutic exercises, Therapeutic activity, Neuromuscular re-education, Patient/Family education, Self Care, Joint mobilization, Joint manipulation, Stair training, Orthotic/Fit training, DME instructions, Aquatic Therapy, Dry Needling, Electrical stimulation, Cryotherapy, Moist heat, Taping, Ultrasound, Ionotophoresis 4mg /ml Dexamethasone, Manual therapy,  Vasopneumatic device, Traction, Spinal manipulation, Spinal mobilization,Balance training, Gait training,   PLAN FOR NEXT SESSION: Hip strength, stairs/pain, ionto,  standing back stretch, standing hip strength.   Sedalia Muta, PT, DPT 10:18 AM  08/10/22

## 2022-08-10 ENCOUNTER — Telehealth: Payer: Self-pay

## 2022-08-10 DIAGNOSIS — F332 Major depressive disorder, recurrent severe without psychotic features: Secondary | ICD-10-CM | POA: Diagnosis not present

## 2022-08-10 NOTE — Telephone Encounter (Addendum)
Opened in error

## 2022-08-10 NOTE — Telephone Encounter (Signed)
-----   Message from Fuller Plan, MD sent at 08/10/2022  3:16 PM EDT ----- The specific antibody test related to the positive ANA are pretty unremarkable.  1 test of the RNP antibody is very slightly above normal at 1.1.  In the absence of other markers I do not know whether this is clinically significant and would not recommend starting any long-term medication treatment based on this right now.  I think we would just need to follow-up as needed if she experiences a flareup of symptoms like we discussed before. ----- Message ----- From: Interface, [facility] Lab Results In Sent: 07/12/2022   6:36 AM EDT To: Fuller Plan, MD

## 2022-08-10 NOTE — Telephone Encounter (Signed)
Attempted to contact the patient and left a message to call the office back regarding lab results. 

## 2022-08-13 ENCOUNTER — Encounter: Payer: Self-pay | Admitting: Physical Therapy

## 2022-08-13 ENCOUNTER — Ambulatory Visit (INDEPENDENT_AMBULATORY_CARE_PROVIDER_SITE_OTHER): Payer: BC Managed Care – PPO | Admitting: Physical Therapy

## 2022-08-13 DIAGNOSIS — F332 Major depressive disorder, recurrent severe without psychotic features: Secondary | ICD-10-CM | POA: Diagnosis not present

## 2022-08-13 DIAGNOSIS — M25551 Pain in right hip: Secondary | ICD-10-CM | POA: Diagnosis not present

## 2022-08-13 NOTE — Therapy (Signed)
OUTPATIENT PHYSICAL THERAPY LOWER EXTREMITY TREATMENT    Patient Name: Deborah Soto MRN: [id number] DOB:07-10-1965, 57 y.o., female Today's Date: 08/13/2022  END OF SESSION:  PT End of Session - 08/13/22 0930     Visit Number 6    Number of Visits 16    Date for PT Re-Evaluation 09/17/22    Authorization Type BCBS    PT Start Time [phone]    PT Stop Time 1010    PT Time Calculation (min) 39 min    Activity Tolerance Patient tolerated treatment well    Behavior During Therapy [facility] for tasks assessed/performed             Past Medical History:  Diagnosis Date   Anemia    Anxiety    Asthma    Depression    Diabetes mellitus type 2 in obese 06/11/2016   Dyspnea    Embolism - blood clot January 1997 & January 2017   Also had LLE DVT in 1997   History of kidney stones    Hypertension    Preeclampsia 10/10/2011   1999    Sleep apnea    Past Surgical History:  Procedure Laterality Date   ANTERIOR CERVICAL DECOMPRESSION/DISCECTOMY FUSION 4 LEVELS N/A 08/17/2020   Procedure: ANTERIOR CERVICAL DECOMPRESSION FUSION CERVICAL THREE - CERVICAL FOUR, CERVICAL FOUR- CERVICAL FIVE, CERVICAL FIVE- CERVICAL SIX WITH INSTRUMENTATION AND ALLOGRAFT;  Surgeon: Estill Bamberg, MD;  Location: [facility] OR;  Service: Orthopedics;  Laterality: N/A;   KIDNEY STONE SURGERY     Patient Active Problem List   Diagnosis Date Noted   Radiculopathy 08/17/2020   Rash 07/28/2020   Vitamin D deficiency 05/03/2020   Neuropathy 04/06/2020   S/P ECT (electroconvulsive therapy) 06/02/2019   Memory loss due to medical condition 06/02/2019   Intention tremor 06/02/2019   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   OSA on CPAP 01/26/2019   Long term use of drug 10/08/2018   Insomnia 10/08/2018   Anxiety 10/08/2018   Tremors of nervous system 10/08/2018   Acne 01/10/2018   Primary hypercoagulable state (HCC) 10/10/2017   Vitamin B12 deficiency  03/18/2017   Diffuse pain 02/22/2017   Morbid obesity (HCC) 06/22/2016   MDD (major depressive disorder), recurrent severe, without psychosis (HCC) 06/14/2016   Diabetes mellitus type 2 in obese 06/11/2016   Positive ANA (antinuclear antibody) 04/02/2016   Hyperparathyroidism (HCC) 01/09/2016   Cervical disc disorder with radiculopathy of cervical region 05/12/2015   Hepatic steatosis 04/18/2015   Achilles tendonitis 04/08/2012   PTE (pulmonary thromboembolism) (HCC) 10/10/2011   Affective disorder (HCC) 10/10/2011   AR (allergic rhinitis) 10/10/2011    PCP: Jacquiline Doe  REFERRING PROVIDER: [name]   REFERRING DIAG: R hip pain   THERAPY DIAG:  Pain in right hip  Rationale for Evaluation and Treatment: Rehabilitation  ONSET DATE:   SUBJECTIVE:   SUBJECTIVE STATEMENT: Pt states hip is improving some, but still sore, feels sore with sitting and with initial standing.   PERTINENT HISTORY:DM, HTN,    PAIN:  Are you having pain? Yes: NPRS scale: 6-7 /10 Pain location: R hip Pain description: sore Aggravating factors: initial standing,  Relieving factors: none stated   PRECAUTIONS: None  WEIGHT BEARING RESTRICTIONS: No  FALLS:  Has patient fallen in last 6 months? No   PLOF: Independent  PATIENT GOALS: decreased pain   NEXT MD VISIT:   OBJECTIVE:   DIAGNOSTIC FINDINGS:   PATIENT SURVEYS:   FOTO:  31  COGNITION: Overall cognitive status: Within functional limits for tasks assessed     SENSATION:   EDEMA: none   POSTURE: rounded shoulders and forward head  PALPATION:  tenderness to R gr troch, and into surrounding glute musculature    LOWER EXTREMITY ROM:  Hip ROM: WFL Knees: WFL  LOWER EXTREMITY MMT:  MMT Right eval Left eval  Hip flexion 4- 4-  Hip extension    Hip abduction 4- 4-  Hip adduction    Hip internal rotation    Hip external rotation    Knee flexion 4 4  Knee extension 4 4  Ankle dorsiflexion    Ankle  plantarflexion    Ankle inversion    Ankle eversion     (Blank rows = not tested)  LOWER EXTREMITY SPECIAL TESTS:   GAIT:  unremarkable    TODAY'S TREATMENT:                                                                                                                              DATE:    08/13/22: Therapeutic Exercise: Aerobic:  Bike L1 x 8 min;  Supine: hip IR/ER x 10 Seated: Sit to stand x 10;  S/L: Hip abd 2 x 10 on R;  Standing: Hip abd 2x10  bil;  marching no hands x 20; at wall for posture x 2 min with education on head position with chin tucks  ;  step ups with knee drive 6 in x 10 ea bil;   Stretches:   piriformis seated fig 4 30 sec x 3 bil;  Neuromuscular Re-education: Manual Therapy:  STM/TPR /tennis ball,  to R glute med and rotators.  Modalities:  Self Care:    08/09/22: Therapeutic Exercise: Aerobic:  Bike L1 x 8 min;  Supine: SLR x 10 bil;  pelvic tilts for back 2 x 10;    Bridging  2 x 10;  Seated: S/L: Hip abd 2 x 10 bil;  clams  x 15 bil;  Standing: Hip abd and ext 3 x 5 ea bil;  marching no hands x 20; at wall for posture x 2 min with education on head position  Stretches:  LTR x 20;  Neuromuscular Re-education: Manual Therapy:  DTM/TPR and IASTM to R hip and glute med Modalities:  Self Care:    08/02/22: Therapeutic Exercise: Aerobic: Supine: SLR x 10 bil;  pelvic tilts for back 2 x 10;   Clams blue TB x 20;   Bridging  2 x 10;  Seated: S/L: Hip abd 2 x 10 bil;  Standing: Hip abd and ext 3 x 5 ea bil;  marching no hands x 20; step ups 6 in x 10 ea bil;  5 Stairs- reciprocal, no UE support x 5;  Stretches:  LTR x 20; Cat/cow 2 x 5;  Neuromuscular Re-education: Manual Therapy:  DTM/TPR and IASTM to R hip and glute med Modalities:  Self Care:   07/31/22 Therapeutic Exercise: Aerobic:  Supine:  pelvic tilts  ;   Clams blue TB x 20;  Supine march with TA x 20;  Bridging  x 10; then x 5;  Seated:  Sit to stand from higher mat table 3 x 5 hands  on legs;  S/L: Hip abd 2 x 10 bil;  Standing: Hip abd and ext 2 x 5 ea bil;  Stretches:  LTR x 20; SKTC for back stretch  Neuromuscular Re-education: Manual Therapy:  STM/tennis ball to glute musculature (pt in s/l)  Modalities: ionto 4 hr patch with dexamethasone, to lateral R hip/gr troch.  Self Care:   PATIENT EDUCATION:  Education details: reviewed HEP Person educated: Patient Education method: Explanation, Demonstration, Tactile cues, Verbal cues, and Handouts Education comprehension: verbalized understanding, returned demonstration, verbal cues required, tactile cues required, and needs further education   HOME EXERCISE PROGRAM: Access Code: [email or web address]   ASSESSMENT:  CLINICAL IMPRESSION:  Pt progressing well but still having soreness with palpation as well as with transfers and stairs. Pt to benefit from continued care.   Eval: Patient presents with primary complaint of increased pain in R hip, consistent with bursitis. She has tenderness in gr trochanter as well as weakness in both hips. She has decreased ability for full functional activities due to pain. She will benefit from skilled PT to improve deficits and pain.    OBJECTIVE IMPAIRMENTS: decreased activity tolerance, decreased mobility, decreased strength, increased muscle spasms, improper body mechanics, and pain.   ACTIVITY LIMITATIONS: bending, standing, squatting, stairs, transfers, and locomotion level  PARTICIPATION LIMITATIONS: cleaning, laundry, shopping, and community activity  PERSONAL FACTORS:  none  are also affecting patient's functional outcome.   REHAB POTENTIAL: Good  CLINICAL DECISION MAKING: Stable/uncomplicated  EVALUATION COMPLEXITY: Low   GOALS: Goals reviewed with patient? Yes  SHORT TERM GOALS: Target date: 08/06/2022   Pt to be independent with initial HEP  Goal status: MET    LONG TERM GOALS: Target date: 09/17/2022  Pt to be independent with final HEP  Goal status: IN  PROGRESS  2.  Pt to report decreased pain in R hip to 0-2/10 with standing activity, walking and transfers.   Goal status: IN PROGRESS  3.  Pt to demo improved strength of bil hips to at least 4/5 to improve stability and pain.   Goal status: IN PROGRESS  4.  Pt to demo safe ability for navigating at least 5 steps, without hand rail, to improve ability for community navigation.   Goal status: IN PROGRESS   PLAN:  PT FREQUENCY: 1-2x/week  PT DURATION: 8 weeks  PLANNED INTERVENTIONS: Therapeutic exercises, Therapeutic activity, Neuromuscular re-education, Patient/Family education, Self Care, Joint mobilization, Joint manipulation, Stair training, Orthotic/Fit training, DME instructions, Aquatic Therapy, Dry Needling, Electrical stimulation, Cryotherapy, Moist heat, Taping, Ultrasound, Ionotophoresis 4mg /ml Dexamethasone, Manual therapy,  Vasopneumatic device, Traction, Spinal manipulation, Spinal mobilization,Balance training, Gait training,   PLAN FOR NEXT SESSION: Hip strength, stairs/pain, ionto,  standing back stretch, standing hip strength.   Sedalia Muta, PT, DPT 9:34 AM  08/13/22

## 2022-08-13 NOTE — Telephone Encounter (Signed)
Attempted to contact the patient and left a message to call the office back regarding lab results. 

## 2022-08-13 NOTE — Telephone Encounter (Signed)
-----   Message from Fuller Plan, MD sent at 08/10/2022  3:16 PM EDT ----- The specific antibody test related to the positive ANA are pretty unremarkable.  1 test of the RNP antibody is very slightly above normal at 1.1.  In the absence of other markers I do not know whether this is clinically significant and would not recommend starting any long-term medication treatment based on this right now.  I think we would just need to follow-up as needed if she experiences a flareup of symptoms like we discussed before. ----- Message ----- From: Interface, [facility] Lab Results In Sent: 07/12/2022   6:36 AM EDT To: Fuller Plan, MD

## 2022-08-13 NOTE — Telephone Encounter (Addendum)
Patient advised The specific antibody test related to the positive ANA are pretty unremarkable. 1 test of the RNP antibody is very slightly above normal at 1.1. In the absence of other markers Dr. Dimple Casey does not know whether this is clinically significant and would not recommend starting any long-term medication treatment based on this right now. Dr. Dimple Casey thinks we would just need to follow-up as needed if she experiences a flareup of symptoms like we discussed before. Patient verbalized understanding.

## 2022-08-14 ENCOUNTER — Encounter: Payer: Self-pay | Admitting: Family Medicine

## 2022-08-14 ENCOUNTER — Ambulatory Visit: Payer: BC Managed Care – PPO | Admitting: Family Medicine

## 2022-08-14 VITALS — BP 106/72 | HR 81 | Temp 97.3°F | Ht 68.0 in | Wt 218.0 lb

## 2022-08-14 DIAGNOSIS — H109 Unspecified conjunctivitis: Secondary | ICD-10-CM

## 2022-08-14 DIAGNOSIS — R52 Pain, unspecified: Secondary | ICD-10-CM | POA: Diagnosis not present

## 2022-08-14 DIAGNOSIS — E1169 Type 2 diabetes mellitus with other specified complication: Secondary | ICD-10-CM | POA: Diagnosis not present

## 2022-08-14 DIAGNOSIS — Z7985 Long-term (current) use of injectable non-insulin antidiabetic drugs: Secondary | ICD-10-CM

## 2022-08-14 DIAGNOSIS — E669 Obesity, unspecified: Secondary | ICD-10-CM

## 2022-08-14 MED ORDER — POLYMYXIN B-TRIMETHOPRIM 10000-0.1 UNIT/ML-% OP SOLN: 2.0000 [drp] | Freq: Four times a day (QID) | OPHTHALMIC | 0 refills | Status: AC

## 2022-08-14 NOTE — Assessment & Plan Note (Signed)
Last A1c 5.8.  Sugars have been up slightly since we went back on Ozempic since she was having some issues with getting Mounjaro at the pharmacy.  She has been consistent with Synjardy 12.07-998 twice daily.  She will check with pharmacy about getting back on Mounjaro but if she cannot we will continue current dose of Ozempic for now.  Recheck A1c next office visit in a few months.

## 2022-08-14 NOTE — Assessment & Plan Note (Signed)
Actually doing much better today compared to our last visit.  She did have some workup with rheumatology that did not show any other significant abnormalities.  She is currently on Lyrica 150 mg in the morning and 300 mg in the evening and we will continue this.  She will follow-up with rheumatology if she has any exacerbations in the near future.

## 2022-08-14 NOTE — Progress Notes (Addendum)
   Deborah Soto is a 57 y.o. female who presents today for an office visit.  Assessment/Plan:  New/Acute Problems: Conjunctivitis No red flags.  Reassuring that her symptoms are improving. Will send in [facility] prescription for Polytrim drops with instruction to not start unless symptoms do not continue to improve over the next few days.  We discussed reasons to return to care.  Chronic Problems Addressed Today: Type 2 diabetes mellitus with obesity (HCC) Last A1c 5.8.  Sugars have been up slightly since we went back on Ozempic since she was having some issues with getting Mounjaro at the pharmacy.  She has been consistent with Synjardy 12.07-998 twice daily.  She will check with pharmacy about getting back on Mounjaro but if she cannot we will continue current dose of Ozempic for now.  Recheck A1c next office visit in a few months.  Diffuse pain Actually doing much better today compared to our last visit.  She did have some workup with rheumatology that did not show any other significant abnormalities.  She is currently on Lyrica 150 mg in the morning and 300 mg in the evening and we will continue this.  She will follow-up with rheumatology if she has any exacerbations in the near future.     Subjective:  HPI:  See A/P for status of chronic conditions.  Patient is here with right eye puffiness and redness.  This started a couple of days ago. Located in her right eye. She did have some discharge. No pain. Some more watering. No cough or congestion.   Since her last visit she has been following with sports medicine and rheumatology.  She has also been working with physical therapy for trochanteric bursitis.  She has had some difficulty with her diabetes medications and has not been able to get her Mounjaro. We did send in Ozempic 2 mg weekly.  She has been compliant with this though this has not been as effective for managing her blood sugar.  Home sugars usually in the mid 100s though  sometimes can be up to the 200s.       Objective:  Physical Exam: BP 106/72   Pulse 81   Temp (!) 97.3 F (36.3 C) (Temporal)   Ht 5\' 8"  (1.727 m)   Wt 218 lb (98.9 kg)   LMP  (LMP Unknown) Comment: last period ---  1 year ago (?)  SpO2 97%   BMI 33.15 kg/m   Wt Readings from Last 3 Encounters:  08/14/22 218 lb (98.9 kg)  07/19/22 216 lb (98 kg)  07/11/22 216 lb (98 kg)   Gen: No acute distress, resting comfortably HEENT: Right conjunctive with erythema and watery discharge.  EOMI without pain. CV: Regular rate and rhythm with no murmurs appreciated Pulm: Normal work of breathing, clear to auscultation bilaterally with no crackles, wheezes, or rhonchi Neuro: Grossly normal, moves all extremities Psych: Normal affect and thought content      Deborah Soto M. [name], MD 08/14/2022 12:47 PM

## 2022-08-14 NOTE — Patient Instructions (Signed)
It was very nice to see you today!  Please start the Polytrim drops.  Come back soon to recheck your A1c.  Return in about 3 months (around 11/14/2022) for Follow Up.   Take care, Dr [name]  PLEASE NOTE:  If you had any lab tests, please let us know if you have not heard back within a few days. You may see your results on mychart before we have a chance to review them but we will give you a call once they are reviewed by [name].   If we ordered any referrals today, please let us know if you have not heard from their office within the next week.   If you had any urgent prescriptions sent in today, please check with the pharmacy within an hour of our visit to make sure the prescription was transmitted appropriately.   Please try these tips to maintain a healthy lifestyle:  Eat at least 3 REAL meals and 1-2 snacks per day.  Aim for no more than 5 hours between eating.  If you eat breakfast, please do so within one hour of getting up.   Each meal should contain half fruits/vegetables, one quarter protein, and one quarter carbs (no bigger than a computer mouse)  Cut down on sweet beverages. This includes juice, soda, and sweet tea.   Drink at least 1 glass of water with each meal and aim for at least 8 glasses per day  Exercise at least 150 minutes every week.

## 2022-08-15 ENCOUNTER — Other Ambulatory Visit: Payer: Self-pay | Admitting: *Deleted

## 2022-08-15 ENCOUNTER — Telehealth: Payer: Self-pay | Admitting: *Deleted

## 2022-08-15 NOTE — Telephone Encounter (Signed)
(  Key: [id number]) Ozempic (1 MG/DOSE) 4MG /3ML pen-injectors Send on 08/15/2022 Waiting for determination

## 2022-08-15 NOTE — Telephone Encounter (Signed)
Message from [facility] Ozempic Approved. . Authorization Expiration Date: August 15, 2023 Pharmacy notified

## 2022-08-15 NOTE — Telephone Encounter (Signed)
Deborah Soto insurance agent call requesting records to be fax to #[phone] Case #[id number] Record fax today

## 2022-08-16 ENCOUNTER — Ambulatory Visit (INDEPENDENT_AMBULATORY_CARE_PROVIDER_SITE_OTHER): Payer: BC Managed Care – PPO | Admitting: Physical Therapy

## 2022-08-16 ENCOUNTER — Encounter: Payer: Self-pay | Admitting: Physical Therapy

## 2022-08-16 DIAGNOSIS — F332 Major depressive disorder, recurrent severe without psychotic features: Secondary | ICD-10-CM | POA: Diagnosis not present

## 2022-08-16 DIAGNOSIS — M25551 Pain in right hip: Secondary | ICD-10-CM | POA: Diagnosis not present

## 2022-08-16 NOTE — Therapy (Signed)
OUTPATIENT PHYSICAL THERAPY LOWER EXTREMITY TREATMENT    Patient Name: Deborah Soto MRN: [id number] DOB:Nov 02, 1965, 57 y.o., female Today's Date: 08/16/2022  END OF SESSION:  PT End of Session - 08/16/22 1022     Visit Number 7    Number of Visits 16    Date for PT Re-Evaluation 09/17/22    Authorization Type BCBS    PT Start Time 1019    PT Stop Time 1100    PT Time Calculation (min) 41 min    Activity Tolerance Patient tolerated treatment well    Behavior During Therapy [facility] for tasks assessed/performed             Past Medical History:  Diagnosis Date   Anemia    Anxiety    Asthma    Depression    Diabetes mellitus type 2 in obese 06/11/2016   Dyspnea    Embolism - blood clot January 1997 & January 2017   Also had LLE DVT in 1997   History of kidney stones    Hypertension    Preeclampsia 10/10/2011   1999    Sleep apnea    Past Surgical History:  Procedure Laterality Date   ANTERIOR CERVICAL DECOMPRESSION/DISCECTOMY FUSION 4 LEVELS N/A 08/17/2020   Procedure: ANTERIOR CERVICAL DECOMPRESSION FUSION CERVICAL THREE - CERVICAL FOUR, CERVICAL FOUR- CERVICAL FIVE, CERVICAL FIVE- CERVICAL SIX WITH INSTRUMENTATION AND ALLOGRAFT;  Surgeon: Estill Bamberg, MD;  Location: [facility] OR;  Service: Orthopedics;  Laterality: N/A;   KIDNEY STONE SURGERY     Patient Active Problem List   Diagnosis Date Noted   Radiculopathy 08/17/2020   Rash 07/28/2020   Vitamin D deficiency 05/03/2020   Neuropathy 04/06/2020   S/P ECT (electroconvulsive therapy) 06/02/2019   Memory loss due to medical condition 06/02/2019   Intention tremor 06/02/2019   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   OSA on CPAP 01/26/2019   Long term use of drug 10/08/2018   Insomnia 10/08/2018   Anxiety 10/08/2018   Tremors of nervous system 10/08/2018   Acne 01/10/2018   Primary hypercoagulable state (HCC) 10/10/2017   Vitamin B12 deficiency  03/18/2017   Diffuse pain 02/22/2017   Morbid obesity (HCC) 06/22/2016   MDD (major depressive disorder), recurrent severe, without psychosis (HCC) 06/14/2016   Type 2 diabetes mellitus with obesity (HCC) 06/11/2016   Positive ANA (antinuclear antibody) 04/02/2016   Hyperparathyroidism (HCC) 01/09/2016   Cervical disc disorder with radiculopathy of cervical region 05/12/2015   Hepatic steatosis 04/18/2015   Achilles tendonitis 04/08/2012   PTE (pulmonary thromboembolism) (HCC) 10/10/2011   Affective disorder (HCC) 10/10/2011   AR (allergic rhinitis) 10/10/2011    PCP: Jacquiline Doe  REFERRING PROVIDER: [name]   REFERRING DIAG: R hip pain   THERAPY DIAG:  Pain in right hip  Rationale for Evaluation and Treatment: Rehabilitation  ONSET DATE:   SUBJECTIVE:   SUBJECTIVE STATEMENT: Pt states hip is improving some, but still sore, feels sore with sitting and with initial standing.   PERTINENT HISTORY:DM, HTN,    PAIN:  Are you having pain? Yes: NPRS scale: 6-7 /10 Pain location: R hip Pain description: sore Aggravating factors: initial standing,  Relieving factors: none stated   PRECAUTIONS: None  WEIGHT BEARING RESTRICTIONS: No  FALLS:  Has patient fallen in last 6 months? No   PLOF: Independent  PATIENT GOALS: decreased pain   NEXT MD VISIT:   OBJECTIVE:   DIAGNOSTIC FINDINGS:   PATIENT SURVEYS:   FOTO:  54  COGNITION: Overall cognitive status: Within functional limits for tasks assessed     SENSATION:   EDEMA: none   POSTURE: rounded shoulders and forward head  PALPATION:  tenderness to R gr troch, and into surrounding glute musculature    LOWER EXTREMITY ROM:  Hip ROM: WFL Knees: WFL  LOWER EXTREMITY MMT:  MMT Right eval Left eval  Hip flexion 4- 4-  Hip extension    Hip abduction 4- 4-  Hip adduction    Hip internal rotation    Hip external rotation    Knee flexion 4 4  Knee extension 4 4  Ankle dorsiflexion    Ankle  plantarflexion    Ankle inversion    Ankle eversion     (Blank rows = not tested)  LOWER EXTREMITY SPECIAL TESTS:   GAIT:  unremarkable    TODAY'S TREATMENT:                                                                                                                              DATE:    08/16/22: Therapeutic Exercise: Aerobic:  Bike L1 x 8 min;  Supine:  Seated: Sit to stand x 10 with GTB at thighs.  S/L: Hip abd 2 x 10 on R  hold and slow lowering.  Standing: Hip abd and extension 3 x 5  bil;   marching no hands x 20;   step ups with knee drive 6 in x 10 ea bil;   Side stepping 25 ft x 8 ;  Stretches:   piriformis seated fig 4 30 sec x 3 bil;  Neuromuscular Re-education: Manual Therapy:  DTM/tennis ball to glute med and piriformis  Modalities:  Ionto/dex 4 hr patch to R gr troch,   Self Care:    08/13/22: Therapeutic Exercise: Aerobic:  Bike L1 x 8 min;  Supine: hip IR/ER x 10 Seated: Sit to stand x 10;  S/L: Hip abd 2 x 10 on R;  Standing: Hip abd 2x10  bil;  marching no hands x 20; at wall for posture x 2 min with education on head position with chin tucks  ;  step ups with knee drive 6 in x 10 ea bil;   Stretches:   piriformis seated fig 4 30 sec x 3 bil;  Neuromuscular Re-education: Manual Therapy:  STM/TPR /tennis ball,  to R glute med and rotators.  Modalities:  Self Care:    08/09/22: Therapeutic Exercise: Aerobic:  Bike L1 x 8 min;  Supine: SLR x 10 bil;  pelvic tilts for back 2 x 10;    Bridging  2 x 10;  Seated: S/L: Hip abd 2 x 10 bil;  clams  x 15 bil;  Standing: Hip abd and ext 3 x 5 ea bil;  marching no hands x 20; at wall for posture x 2 min with education on head position  Stretches:  LTR x 20;  Neuromuscular Re-education: Manual Therapy:  DTM/TPR and  IASTM to R hip and glute med Modalities:  Self Care:   PATIENT EDUCATION:  Education details: reviewed HEP Person educated: Patient Education method: Explanation, Demonstration, Tactile cues,  Verbal cues, and Handouts Education comprehension: verbalized understanding, returned demonstration, verbal cues required, tactile cues required, and needs further education   HOME EXERCISE PROGRAM: Access Code: [email or web address]   ASSESSMENT:  CLINICAL IMPRESSION: Pt progressing well but still having soreness with palpation as well as with transfers, in and out of car,  and stairs. Pt to benefit from continued care.   Eval: Patient presents with primary complaint of increased pain in R hip, consistent with bursitis. She has tenderness in gr trochanter as well as weakness in both hips. She has decreased ability for full functional activities due to pain. She will benefit from skilled PT to improve deficits and pain.    OBJECTIVE IMPAIRMENTS: decreased activity tolerance, decreased mobility, decreased strength, increased muscle spasms, improper body mechanics, and pain.   ACTIVITY LIMITATIONS: bending, standing, squatting, stairs, transfers, and locomotion level  PARTICIPATION LIMITATIONS: cleaning, laundry, shopping, and community activity  PERSONAL FACTORS:  none  are also affecting patient's functional outcome.   REHAB POTENTIAL: Good  CLINICAL DECISION MAKING: Stable/uncomplicated  EVALUATION COMPLEXITY: Low   GOALS: Goals reviewed with patient? Yes  SHORT TERM GOALS: Target date: 08/06/2022   Pt to be independent with initial HEP  Goal status: MET    LONG TERM GOALS: Target date: 09/17/2022  Pt to be independent with final HEP  Goal status: IN PROGRESS  2.  Pt to report decreased pain in R hip to 0-2/10 with standing activity, walking and transfers.   Goal status: IN PROGRESS  3.  Pt to demo improved strength of bil hips to at least 4/5 to improve stability and pain.   Goal status: IN PROGRESS  4.  Pt to demo safe ability for navigating at least 5 steps, without hand rail, to improve ability for community navigation.   Goal status: IN PROGRESS   PLAN:  PT  FREQUENCY: 1-2x/week  PT DURATION: 8 weeks  PLANNED INTERVENTIONS: Therapeutic exercises, Therapeutic activity, Neuromuscular re-education, Patient/Family education, Self Care, Joint mobilization, Joint manipulation, Stair training, Orthotic/Fit training, DME instructions, Aquatic Therapy, Dry Needling, Electrical stimulation, Cryotherapy, Moist heat, Taping, Ultrasound, Ionotophoresis 4mg /ml Dexamethasone, Manual therapy,  Vasopneumatic device, Traction, Spinal manipulation, Spinal mobilization,Balance training, Gait training,   PLAN FOR NEXT SESSION: Hip strength, stairs/pain, ionto,  standing back stretch, standing hip strength.   Sedalia Muta, PT, DPT 10:23 AM  08/16/22

## 2022-08-18 ENCOUNTER — Other Ambulatory Visit: Payer: Self-pay | Admitting: Family Medicine

## 2022-08-18 DIAGNOSIS — M797 Fibromyalgia: Secondary | ICD-10-CM

## 2022-08-20 ENCOUNTER — Encounter: Payer: Self-pay | Admitting: Physical Therapy

## 2022-08-20 ENCOUNTER — Ambulatory Visit (INDEPENDENT_AMBULATORY_CARE_PROVIDER_SITE_OTHER): Payer: BC Managed Care – PPO | Admitting: Physical Therapy

## 2022-08-20 DIAGNOSIS — F332 Major depressive disorder, recurrent severe without psychotic features: Secondary | ICD-10-CM | POA: Diagnosis not present

## 2022-08-20 DIAGNOSIS — M25551 Pain in right hip: Secondary | ICD-10-CM | POA: Diagnosis not present

## 2022-08-20 NOTE — Therapy (Signed)
OUTPATIENT PHYSICAL THERAPY LOWER EXTREMITY TREATMENT    Patient Name: Deborah Soto MRN: [id number] DOB:30-Jan-1966, 57 y.o., female Today's Date: 08/16/2022  END OF SESSION:  PT End of Session - 08/20/22 1015     Visit Number 8    Number of Visits 16    Date for PT Re-Evaluation 09/17/22    Authorization Type BCBS    PT Start Time 1017    PT Stop Time 1100    PT Time Calculation (min) 43 min    Activity Tolerance Patient tolerated treatment well    Behavior During Therapy [facility] for tasks assessed/performed             Past Medical History:  Diagnosis Date   Anemia    Anxiety    Asthma    Depression    Diabetes mellitus type 2 in obese 06/11/2016   Dyspnea    Embolism - blood clot January 1997 & January 2017   Also had LLE DVT in 1997   History of kidney stones    Hypertension    Preeclampsia 10/10/2011   1999    Sleep apnea    Past Surgical History:  Procedure Laterality Date   ANTERIOR CERVICAL DECOMPRESSION/DISCECTOMY FUSION 4 LEVELS N/A 08/17/2020   Procedure: ANTERIOR CERVICAL DECOMPRESSION FUSION CERVICAL THREE - CERVICAL FOUR, CERVICAL FOUR- CERVICAL FIVE, CERVICAL FIVE- CERVICAL SIX WITH INSTRUMENTATION AND ALLOGRAFT;  Surgeon: Estill Bamberg, MD;  Location: [facility] OR;  Service: Orthopedics;  Laterality: N/A;   KIDNEY STONE SURGERY     Patient Active Problem List   Diagnosis Date Noted   Radiculopathy 08/17/2020   Rash 07/28/2020   Vitamin D deficiency 05/03/2020   Neuropathy 04/06/2020   S/P ECT (electroconvulsive therapy) 06/02/2019   Memory loss due to medical condition 06/02/2019   Intention tremor 06/02/2019   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   OSA on CPAP 01/26/2019   Long term use of drug 10/08/2018   Insomnia 10/08/2018   Anxiety 10/08/2018   Tremors of nervous system 10/08/2018   Acne 01/10/2018   Primary hypercoagulable state (HCC) 10/10/2017   Vitamin B12 deficiency  03/18/2017   Diffuse pain 02/22/2017   Morbid obesity (HCC) 06/22/2016   MDD (major depressive disorder), recurrent severe, without psychosis (HCC) 06/14/2016   Type 2 diabetes mellitus with obesity (HCC) 06/11/2016   Positive ANA (antinuclear antibody) 04/02/2016   Hyperparathyroidism (HCC) 01/09/2016   Cervical disc disorder with radiculopathy of cervical region 05/12/2015   Hepatic steatosis 04/18/2015   Achilles tendonitis 04/08/2012   PTE (pulmonary thromboembolism) (HCC) 10/10/2011   Affective disorder (HCC) 10/10/2011   AR (allergic rhinitis) 10/10/2011    PCP: Jacquiline Doe  REFERRING PROVIDER: [name]   REFERRING DIAG: R hip pain   THERAPY DIAG:  Pain in right hip  Rationale for Evaluation and Treatment: Rehabilitation  ONSET DATE:   SUBJECTIVE:   SUBJECTIVE STATEMENT: Pt states hip is improving some, but still sore, feels sore with sitting and with initial standing.   PERTINENT HISTORY:DM, HTN,    PAIN:  Are you having pain? Yes: NPRS scale: 6-7 /10 Pain location: R hip Pain description: sore Aggravating factors: initial standing,  Relieving factors: none stated   PRECAUTIONS: None  WEIGHT BEARING RESTRICTIONS: No  FALLS:  Has patient fallen in last 6 months? No   PLOF: Independent  PATIENT GOALS: decreased pain   NEXT MD VISIT:   OBJECTIVE:   DIAGNOSTIC FINDINGS:   PATIENT SURVEYS:   FOTO:  54  COGNITION: Overall cognitive status: Within functional limits for tasks assessed     SENSATION:   EDEMA: none   POSTURE: rounded shoulders and forward head  PALPATION:  tenderness to R gr troch, and into surrounding glute musculature    LOWER EXTREMITY ROM:  Hip ROM: WFL Knees: WFL  LOWER EXTREMITY MMT:  MMT Right eval Left eval  Hip flexion 4- 4-  Hip extension    Hip abduction 4- 4-  Hip adduction    Hip internal rotation    Hip external rotation    Knee flexion 4 4  Knee extension 4 4  Ankle dorsiflexion    Ankle  plantarflexion    Ankle inversion    Ankle eversion     (Blank rows = not tested)  LOWER EXTREMITY SPECIAL TESTS:   GAIT:  unremarkable    TODAY'S TREATMENT:                                                                                                                              DATE:    08/20/22: Therapeutic Exercise: Aerobic:  Bike L1-2  x 8 min;  Supine:  Seated: Sit to stand x 10 with GTB at thighs, regular chair, no arms.  S/L:  Hip abd 2 x 10 on R  hold and slow lowering.  Standing: Hip abd and extension 2 x 10 ea  bil;   marching no hands x 20;  Walk/march x 5;  step ups with knee drive 6 in x 10 on L, 2 x 10 on R;    Side stepping 25 ft x 4 ;  Stretches:   piriformis seated fig 4 30 sec x 3 bil;  Neuromuscular Re-education: Manual Therapy:  DTM/IASTM to glute med and gr troch Modalities:   Self Care:    08/13/22: Therapeutic Exercise: Aerobic:  Bike L1 x 8 min;  Supine: hip IR/ER x 10 Seated: Sit to stand x 10;  S/L: Hip abd 2 x 10 on R;  Standing: Hip abd 2x10  bil;  marching no hands x 20; at wall for posture x 2 min with education on head position with chin tucks  ;  step ups with knee drive 6 in x 10 ea bil;   Stretches:   piriformis seated fig 4 30 sec x 3 bil;  Neuromuscular Re-education: Manual Therapy:  STM/TPR /tennis ball,  to R glute med and rotators.  Modalities:  Self Care:    08/09/22: Therapeutic Exercise: Aerobic:  Bike L1 x 8 min;  Supine: SLR x 10 bil;  pelvic tilts for back 2 x 10;    Bridging  2 x 10;  Seated: S/L: Hip abd 2 x 10 bil;  clams  x 15 bil;  Standing: Hip abd and ext 3 x 5 ea bil;  marching no hands x 20; at wall for posture x 2 min with education on head position  Stretches:  LTR x 20;  Neuromuscular  Re-education: Manual Therapy:  DTM/TPR and IASTM to R hip and glute med Modalities:  Self Care:   PATIENT EDUCATION:  Education details: reviewed HEP Person educated: Patient Education method: Explanation,  Demonstration, Tactile cues, Verbal cues, and Handouts Education comprehension: verbalized understanding, returned demonstration, verbal cues required, tactile cues required, and needs further education   HOME EXERCISE PROGRAM: Access Code: [email or web address]   ASSESSMENT:  CLINICAL IMPRESSION: Pt progressing well with strength and function, but still having pain. She has less pain with sit to stands especially with attention to form. Pt to benefit from continued care. Has md f/u in another week or so.   Eval: Patient presents with primary complaint of increased pain in R hip, consistent with bursitis. She has tenderness in gr trochanter as well as weakness in both hips. She has decreased ability for full functional activities due to pain. She will benefit from skilled PT to improve deficits and pain.    OBJECTIVE IMPAIRMENTS: decreased activity tolerance, decreased mobility, decreased strength, increased muscle spasms, improper body mechanics, and pain.   ACTIVITY LIMITATIONS: bending, standing, squatting, stairs, transfers, and locomotion level  PARTICIPATION LIMITATIONS: cleaning, laundry, shopping, and community activity  PERSONAL FACTORS:  none  are also affecting patient's functional outcome.   REHAB POTENTIAL: Good  CLINICAL DECISION MAKING: Stable/uncomplicated  EVALUATION COMPLEXITY: Low   GOALS: Goals reviewed with patient? Yes  SHORT TERM GOALS: Target date: 08/06/2022   Pt to be independent with initial HEP  Goal status: MET    LONG TERM GOALS: Target date: 09/17/2022  Pt to be independent with final HEP  Goal status: IN PROGRESS  2.  Pt to report decreased pain in R hip to 0-2/10 with standing activity, walking and transfers.   Goal status: IN PROGRESS  3.  Pt to demo improved strength of bil hips to at least 4/5 to improve stability and pain.   Goal status: IN PROGRESS  4.  Pt to demo safe ability for navigating at least 5 steps, without hand rail, to  improve ability for community navigation.   Goal status: IN PROGRESS   PLAN:  PT FREQUENCY: 1-2x/week  PT DURATION: 8 weeks  PLANNED INTERVENTIONS: Therapeutic exercises, Therapeutic activity, Neuromuscular re-education, Patient/Family education, Self Care, Joint mobilization, Joint manipulation, Stair training, Orthotic/Fit training, DME instructions, Aquatic Therapy, Dry Needling, Electrical stimulation, Cryotherapy, Moist heat, Taping, Ultrasound, Ionotophoresis 4mg /ml Dexamethasone, Manual therapy,  Vasopneumatic device, Traction, Spinal manipulation, Spinal mobilization,Balance training, Gait training,   PLAN FOR NEXT SESSION: Hip strength, stairs/pain, ionto,  standing back stretch, standing hip strength.   Sedalia Muta, PT, DPT 10:39 AM  08/20/22

## 2022-08-21 DIAGNOSIS — F332 Major depressive disorder, recurrent severe without psychotic features: Secondary | ICD-10-CM | POA: Diagnosis not present

## 2022-08-23 ENCOUNTER — Ambulatory Visit (INDEPENDENT_AMBULATORY_CARE_PROVIDER_SITE_OTHER): Payer: BC Managed Care – PPO | Admitting: Physical Therapy

## 2022-08-23 ENCOUNTER — Encounter: Payer: Self-pay | Admitting: Physical Therapy

## 2022-08-23 DIAGNOSIS — M25551 Pain in right hip: Secondary | ICD-10-CM | POA: Diagnosis not present

## 2022-08-23 NOTE — Therapy (Addendum)
OUTPATIENT PHYSICAL THERAPY LOWER EXTREMITY TREATMENT    Patient Name: Deborah Soto MRN: [id number] DOB:10-06-1965, 57 y.o., female Today's Date: 08/23/2022  END OF SESSION:  PT End of Session - 08/23/22 1019     Visit Number 9    Number of Visits 16    Date for PT Re-Evaluation 09/17/22    Authorization Type BCBS    PT Start Time 1018    PT Stop Time 1100    PT Time Calculation (min) 42 min    Activity Tolerance Patient tolerated treatment well    Behavior During Therapy [facility] for tasks assessed/performed             Past Medical History:  Diagnosis Date   Anemia    Anxiety    Asthma    Depression    Diabetes mellitus type 2 in obese 06/11/2016   Dyspnea    Embolism - blood clot January 1997 & January 2017   Also had LLE DVT in 1997   History of kidney stones    Hypertension    Preeclampsia 10/10/2011   1999    Sleep apnea    Past Surgical History:  Procedure Laterality Date   ANTERIOR CERVICAL DECOMPRESSION/DISCECTOMY FUSION 4 LEVELS N/A 08/17/2020   Procedure: ANTERIOR CERVICAL DECOMPRESSION FUSION CERVICAL THREE - CERVICAL FOUR, CERVICAL FOUR- CERVICAL FIVE, CERVICAL FIVE- CERVICAL SIX WITH INSTRUMENTATION AND ALLOGRAFT;  Surgeon: Estill Bamberg, MD;  Location: [facility] OR;  Service: Orthopedics;  Laterality: N/A;   KIDNEY STONE SURGERY     Patient Active Problem List   Diagnosis Date Noted   Radiculopathy 08/17/2020   Rash 07/28/2020   Vitamin D deficiency 05/03/2020   Neuropathy 04/06/2020   S/P ECT (electroconvulsive therapy) 06/02/2019   Memory loss due to medical condition 06/02/2019   Intention tremor 06/02/2019   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   OSA on CPAP 01/26/2019   Long term use of drug 10/08/2018   Insomnia 10/08/2018   Anxiety 10/08/2018   Tremors of nervous system 10/08/2018   Acne 01/10/2018   Primary hypercoagulable state (HCC) 10/10/2017   Vitamin B12 deficiency  03/18/2017   Diffuse pain 02/22/2017   Morbid obesity (HCC) 06/22/2016   MDD (major depressive disorder), recurrent severe, without psychosis (HCC) 06/14/2016   Type 2 diabetes mellitus with obesity (HCC) 06/11/2016   Positive ANA (antinuclear antibody) 04/02/2016   Hyperparathyroidism (HCC) 01/09/2016   Cervical disc disorder with radiculopathy of cervical region 05/12/2015   Hepatic steatosis 04/18/2015   Achilles tendonitis 04/08/2012   PTE (pulmonary thromboembolism) (HCC) 10/10/2011   Affective disorder (HCC) 10/10/2011   AR (allergic rhinitis) 10/10/2011    PCP: Jacquiline Doe  REFERRING PROVIDER: [name]   REFERRING DIAG: R hip pain   THERAPY DIAG:  Pain in right hip  Rationale for Evaluation and Treatment: Rehabilitation  ONSET DATE:   SUBJECTIVE:   SUBJECTIVE STATEMENT: Pt states hip is improving some, but still sore, feels sore with sitting and with initial standing.   PERTINENT HISTORY:DM, HTN,    PAIN:  Are you having pain? Yes: NPRS scale: 6-7 /10 Pain location: R hip Pain description: sore Aggravating factors: initial standing,  Relieving factors: none stated   PRECAUTIONS: None  WEIGHT BEARING RESTRICTIONS: No  FALLS:  Has patient fallen in last 6 months? No   PLOF: Independent  PATIENT GOALS: decreased pain   NEXT MD VISIT:   OBJECTIVE:   DIAGNOSTIC FINDINGS:   PATIENT SURVEYS:   FOTO:  Eval: 56,  visit 9: 69   COGNITION: Overall cognitive status: Within functional limits for tasks assessed     SENSATION:   EDEMA: none   POSTURE: rounded shoulders and forward head  PALPATION:  tenderness to R gr troch, and into surrounding glute musculature    LOWER EXTREMITY ROM:  Hip ROM: WFL Knees: WFL  LOWER EXTREMITY MMT:  MMT Right eval Left eval R/L 6/13  Hip flexion 4- 4- 4+  Hip extension     Hip abduction 4- 4- 4+  Hip adduction     Hip internal rotation     Hip external rotation     Knee flexion 4 4 5/5  Knee  extension 4 4 5/5  Ankle dorsiflexion     Ankle plantarflexion     Ankle inversion     Ankle eversion      (Blank rows = not tested)  LOWER EXTREMITY SPECIAL TESTS:   GAIT:  unremarkable    TODAY'S TREATMENT:                                                                                                                              DATE:    08/23/22: Therapeutic Exercise: Aerobic:  Bike L1 x 8 min; (move up to level 2)  Supine:  Seated: Sit to stand x 10 with GTB at thighs, regular chair, no arms.  S/L:   Standing: Hip abd and extension 2 x 10 ea  bil;   marching no hands x 20;  Walk/march fwd and bwd, x 5;   Side stepping 25 ft x 4 ;  Stretches:   piriformis seated fig 4 30 sec x 3 bil;  Neuromuscular Re-education: Manual Therapy:   Modalities:   Self Care:    08/20/22: Therapeutic Exercise: Aerobic:  Bike L1-2  x 8 min;  Supine:  Seated: Sit to stand x 10 with GTB at thighs, regular chair, no arms.  S/L:  Hip abd 2 x 10 on R  hold and slow lowering.  Standing: Hip abd and extension 2 x 10 ea  bil;   marching no hands x 20;  Walk/march x 5;  step ups with knee drive 6 in x 10 on L, 2 x 10 on R;    Side stepping 25 ft x 4 ;  Stretches:   piriformis seated fig 4 30 sec x 3 bil;  Neuromuscular Re-education: Manual Therapy:  DTM/IASTM to glute med and gr troch Modalities:   Self Care:    08/13/22: Therapeutic Exercise: Aerobic:  Bike L1 x 8 min;  Supine: hip IR/ER x 10 Seated: Sit to stand x 10;  S/L: Hip abd 2 x 10 on R;  Standing: Hip abd 2x10  bil;  marching no hands x 20; at wall for posture x 2 min with education on head position with chin tucks  ;  step ups with knee drive 6 in x 10 ea bil;   Stretches:  piriformis seated fig 4 30 sec x 3 bil;  Neuromuscular Re-education: Manual Therapy:  STM/TPR /tennis ball,  to R glute med and rotators.  Modalities:  Self Care:    08/09/22: Therapeutic Exercise: Aerobic:  Bike L1 x 8 min;  Supine: SLR x 10 bil;   pelvic tilts for back 2 x 10;    Bridging  2 x 10;  Seated: S/L: Hip abd 2 x 10 bil;  clams  x 15 bil;  Standing: Hip abd and ext 3 x 5 ea bil;  marching no hands x 20; at wall for posture x 2 min with education on head position  Stretches:  LTR x 20;  Neuromuscular Re-education: Manual Therapy:  DTM/TPR and IASTM to R hip and glute med Modalities:  Self Care:   PATIENT EDUCATION:  Education details: reviewed HEP Person educated: Patient Education method: Programmer, multimedia, Demonstration, Tactile cues, Verbal cues, and Handouts Education comprehension: verbalized understanding, returned demonstration, verbal cues required, tactile cues required, and needs further education   HOME EXERCISE PROGRAM: Access Code: [email or web address]   ASSESSMENT:  CLINICAL IMPRESSION: Pt progressing well with strength and function, but still having pain. She has f/u with MD next week, and will return after that. Possible d/c at that time, if pt still doing well. She is doing well with HEP. Remaining pain has not improved significantly in the last couple weeks.   Eval: Patient presents with primary complaint of increased pain in R hip, consistent with bursitis. She has tenderness in gr trochanter as well as weakness in both hips. She has decreased ability for full functional activities due to pain. She will benefit from skilled PT to improve deficits and pain.    OBJECTIVE IMPAIRMENTS: decreased activity tolerance, decreased mobility, decreased strength, increased muscle spasms, improper body mechanics, and pain.   ACTIVITY LIMITATIONS: bending, standing, squatting, stairs, transfers, and locomotion level  PARTICIPATION LIMITATIONS: cleaning, laundry, shopping, and community activity  PERSONAL FACTORS:  none  are also affecting patient's functional outcome.   REHAB POTENTIAL: Good  CLINICAL DECISION MAKING: Stable/uncomplicated  EVALUATION COMPLEXITY: Low   GOALS: Goals reviewed with patient? Yes  SHORT  TERM GOALS: Target date: 08/06/2022   Pt to be independent with initial HEP  Goal status: MET    LONG TERM GOALS: Target date: 09/17/2022  Pt to be independent with final HEP  Goal status: IN PROGRESS  2.  Pt to report decreased pain in R hip to 0-2/10 with standing activity, walking and transfers.   Goal status: IN PROGRESS  3.  Pt to demo improved strength of bil hips to at least 4/5 to improve stability and pain.   Goal status: IN PROGRESS  4.  Pt to demo safe ability for navigating at least 5 steps, without hand rail, to improve ability for community navigation.   Goal status: IN PROGRESS   PLAN:  PT FREQUENCY: 1-2x/week  PT DURATION: 8 weeks  PLANNED INTERVENTIONS: Therapeutic exercises, Therapeutic activity, Neuromuscular re-education, Patient/Family education, Self Care, Joint mobilization, Joint manipulation, Stair training, Orthotic/Fit training, DME instructions, Aquatic Therapy, Dry Needling, Electrical stimulation, Cryotherapy, Moist heat, Taping, Ultrasound, Ionotophoresis 4mg /ml Dexamethasone, Manual therapy,  Vasopneumatic device, Traction, Spinal manipulation, Spinal mobilization,Balance training, Gait training,   PLAN FOR NEXT SESSION: Hip strength, stairs/pain, ionto,  standing back stretch, standing hip strength.   Sedalia Muta, PT, DPT 12:00 PM  08/23/22   PHYSICAL THERAPY DISCHARGE SUMMARY  Visits from Start of Care: 9   Plan: Patient agrees to discharge.  Patient goals were  partially met. Patient is being discharged due to - not returning since last visit.       Sedalia Muta, PT, DPT 8:57 AM  11/28/22

## 2022-08-24 ENCOUNTER — Other Ambulatory Visit: Payer: Self-pay | Admitting: Psychiatry

## 2022-08-24 DIAGNOSIS — F339 Major depressive disorder, recurrent, unspecified: Secondary | ICD-10-CM

## 2022-08-27 DIAGNOSIS — F332 Major depressive disorder, recurrent severe without psychotic features: Secondary | ICD-10-CM | POA: Diagnosis not present

## 2022-08-28 DIAGNOSIS — F332 Major depressive disorder, recurrent severe without psychotic features: Secondary | ICD-10-CM | POA: Diagnosis not present

## 2022-08-29 NOTE — Progress Notes (Unsigned)
   Rubin Payor, PhD, LAT, ATC acting as a scribe for [name], MD.  Deborah Soto is a 57 y.o. female who presents to [facility] today for 6-wk f/u R hip pain. Pt was last seen by Dr. Denyse Amass on 07/19/22 and was referred to PT, completing 9 visits, and advised against using oral NSAIDs. Today, pt reports about 50% improvement. Continues to have pain will first steps. Will occasionally have pain when sitting. Sx are the worst with right side-lying. Continues taking Celebrex. Has been compliant with HEP every other day.   Dx imaging: 07/19/22 R hip XR  05/04/20 L-spine MRI  03/14/20 L-spine XR  Pertinent review of systems: No fevers or chills  Relevant historical information: Diabetes   Exam:  BP 102/78   Pulse 83   Ht 5\' 8"  (1.727 m)   Wt 220 lb 3.2 oz (99.9 kg)   LMP  (LMP Unknown) Comment: last period ---  1 year ago (?)  SpO2 97%   BMI 33.48 kg/m  General: Well Developed, well nourished, and in no acute distress.   MSK: Right lateral hip normal appearing Tender palpation greater trochanter.       Assessment and Plan: 57 y.o. female with right lateral hip pain thought to be due to trochanteric bursitis.  She would like to continue home exercise program for now after completing physical therapy.  Next step would typically be injection here.  She would like to wait a little bit and see if she can get better without it which I think is reasonable.  We talked about other options.  Recommend Voltaren gel.  I did prescribe lidocaine patches.   PDMP not reviewed this encounter. Orders Placed This Encounter  Procedures   [name] LIMITED JOINT SPACE STRUCTURES LOW RIGHT(NO LINKED CHARGES)    Order Specific Question:   Reason for Exam (SYMPTOM  OR DIAGNOSIS REQUIRED)    Answer:   right hip pain    Order Specific Question:   Preferred imaging location?    Answer:   Adult nurse [facility] ordered this encounter  Medications    lidocaine (LIDODERM) 5 %    Sig: Place 1 patch onto the skin every 12 (twelve) hours. Remove & Discard patch within 12 hours or as directed by MD    Dispense:  30 patch    Refill:  2     Discussed warning signs or symptoms. Please see discharge instructions. Patient expresses understanding.   The above documentation has been reviewed and is accurate and complete [name], M.D.

## 2022-08-30 ENCOUNTER — Other Ambulatory Visit: Payer: Self-pay

## 2022-08-30 ENCOUNTER — Ambulatory Visit: Payer: BC Managed Care – PPO | Admitting: Family Medicine

## 2022-08-30 ENCOUNTER — Encounter: Payer: Self-pay | Admitting: Family Medicine

## 2022-08-30 VITALS — BP 102/78 | HR 83 | Ht 68.0 in | Wt 220.2 lb

## 2022-08-30 DIAGNOSIS — F332 Major depressive disorder, recurrent severe without psychotic features: Secondary | ICD-10-CM | POA: Diagnosis not present

## 2022-08-30 DIAGNOSIS — M25551 Pain in right hip: Secondary | ICD-10-CM

## 2022-08-30 MED ORDER — LIDOCAINE 5 % EX PTCH: 1.0000 | MEDICATED_PATCH | Freq: Two times a day (BID) | CUTANEOUS | 2 refills | Status: AC

## 2022-08-30 NOTE — Patient Instructions (Addendum)
Thank you for coming in today.  Try the lidocaine patches.   Voltaren gel .   Recheck as needed. We could do injection at any time especially before your kido goes to college this fall.   Keep working on the exercises.

## 2022-09-04 ENCOUNTER — Telehealth: Payer: Self-pay

## 2022-09-04 DIAGNOSIS — F332 Major depressive disorder, recurrent severe without psychotic features: Secondary | ICD-10-CM | POA: Diagnosis not present

## 2022-09-04 NOTE — Telephone Encounter (Signed)
Lidocaine 5% Patches  [email or web address] KEY: [facility]

## 2022-09-04 NOTE — Telephone Encounter (Signed)
Prior Authorization initiated for LIDOCAINE PATCH via [email or web address] KEY: [facility]

## 2022-09-04 NOTE — Telephone Encounter (Signed)
Original Claim Info 75 PA REQUIRED--QUANTITY LIMIT MAY APPLY For [employer] Coupon Price of: $70.85 submit to BIN: [id number] PCN: [id number] Group: [id number] --Service provided at no cost and no switch fee to the pharmacy--

## 2022-09-06 DIAGNOSIS — F332 Major depressive disorder, recurrent severe without psychotic features: Secondary | ICD-10-CM | POA: Diagnosis not present

## 2022-09-06 NOTE — Telephone Encounter (Signed)
Coverage for Lidocaine Patches denied.

## 2022-09-06 NOTE — Telephone Encounter (Signed)
Called pt and advised of insurance coverage denial for Lidocaine Patches. Pt verbalized understanding.

## 2022-09-07 ENCOUNTER — Encounter: Payer: BC Managed Care – PPO | Admitting: Physical Therapy

## 2022-09-10 ENCOUNTER — Other Ambulatory Visit: Payer: Self-pay | Admitting: Family Medicine

## 2022-09-10 DIAGNOSIS — F332 Major depressive disorder, recurrent severe without psychotic features: Secondary | ICD-10-CM | POA: Diagnosis not present

## 2022-09-11 ENCOUNTER — Other Ambulatory Visit: Payer: Self-pay | Admitting: *Deleted

## 2022-09-11 DIAGNOSIS — F332 Major depressive disorder, recurrent severe without psychotic features: Secondary | ICD-10-CM | POA: Diagnosis not present

## 2022-09-11 MED ORDER — SEMAGLUTIDE (2 MG/DOSE) 8 MG/3ML ~~LOC~~ SOPN: 2.0000 mg | PEN_INJECTOR | SUBCUTANEOUS | 1 refills | Status: DC

## 2022-09-11 NOTE — Telephone Encounter (Signed)
Pharmacy faxed a request for changes on mounjaro  Rx Back order  Called patient for recommendation on pharmacy changes

## 2022-09-11 NOTE — Telephone Encounter (Signed)
Ok to change to [employer] 2mg  weekly per Dr [name]  Rx send to [facility]

## 2022-09-17 ENCOUNTER — Other Ambulatory Visit: Payer: Self-pay | Admitting: Family Medicine

## 2022-09-17 DIAGNOSIS — F332 Major depressive disorder, recurrent severe without psychotic features: Secondary | ICD-10-CM | POA: Diagnosis not present

## 2022-09-18 DIAGNOSIS — F332 Major depressive disorder, recurrent severe without psychotic features: Secondary | ICD-10-CM | POA: Diagnosis not present

## 2022-09-20 DIAGNOSIS — F332 Major depressive disorder, recurrent severe without psychotic features: Secondary | ICD-10-CM | POA: Diagnosis not present

## 2022-09-24 DIAGNOSIS — F332 Major depressive disorder, recurrent severe without psychotic features: Secondary | ICD-10-CM | POA: Diagnosis not present

## 2022-09-25 DIAGNOSIS — F332 Major depressive disorder, recurrent severe without psychotic features: Secondary | ICD-10-CM | POA: Diagnosis not present

## 2022-09-27 DIAGNOSIS — F332 Major depressive disorder, recurrent severe without psychotic features: Secondary | ICD-10-CM | POA: Diagnosis not present

## 2022-10-02 DIAGNOSIS — F332 Major depressive disorder, recurrent severe without psychotic features: Secondary | ICD-10-CM | POA: Diagnosis not present

## 2022-10-03 ENCOUNTER — Ambulatory Visit: Payer: BC Managed Care – PPO | Admitting: Psychiatry

## 2022-10-03 ENCOUNTER — Encounter: Payer: Self-pay | Admitting: Psychiatry

## 2022-10-03 DIAGNOSIS — R7989 Other specified abnormal findings of blood chemistry: Secondary | ICD-10-CM

## 2022-10-03 DIAGNOSIS — F339 Major depressive disorder, recurrent, unspecified: Secondary | ICD-10-CM | POA: Diagnosis not present

## 2022-10-03 DIAGNOSIS — F411 Generalized anxiety disorder: Secondary | ICD-10-CM | POA: Diagnosis not present

## 2022-10-03 DIAGNOSIS — G4710 Hypersomnia, unspecified: Secondary | ICD-10-CM | POA: Diagnosis not present

## 2022-10-03 DIAGNOSIS — R52 Pain, unspecified: Secondary | ICD-10-CM

## 2022-10-03 DIAGNOSIS — G4733 Obstructive sleep apnea (adult) (pediatric): Secondary | ICD-10-CM | POA: Diagnosis not present

## 2022-10-03 DIAGNOSIS — K130 Diseases of lips: Secondary | ICD-10-CM | POA: Diagnosis not present

## 2022-10-03 DIAGNOSIS — G251 Drug-induced tremor: Secondary | ICD-10-CM

## 2022-10-03 DIAGNOSIS — Z79899 Other long term (current) drug therapy: Secondary | ICD-10-CM

## 2022-10-03 DIAGNOSIS — L219 Seborrheic dermatitis, unspecified: Secondary | ICD-10-CM | POA: Diagnosis not present

## 2022-10-03 MED ORDER — OLANZAPINE 2.5 MG PO TABS: 2.5000 mg | ORAL_TABLET | Freq: Every day | ORAL | 1 refills | Status: DC

## 2022-10-03 MED ORDER — VILAZODONE HCL 40 MG PO TABS: 60.0000 mg | ORAL_TABLET | Freq: Every day | ORAL | 0 refills | Status: DC

## 2022-10-03 MED ORDER — AUVELITY 45-105 MG PO TBCR
1.0000 | EXTENDED_RELEASE_TABLET | Freq: Two times a day (BID) | ORAL | 3 refills | Status: DC
Start: 2022-10-03 — End: 2023-03-14

## 2022-10-03 NOTE — Progress Notes (Signed)
Deborah Soto [id number] Jul 06, 1965 57 y.o.     Subjective:   Patient ID:  Deborah Soto is a 57 y.o. (DOB 03-22-1965) female.  Chief Complaint:  Chief Complaint  Patient presents with   Follow-up   Depression   Anxiety   Fatigue   Depression        Associated symptoms include fatigue and myalgias.  Associated symptoms include no decreased concentration.   Deborah Soto presents to the office today for follow-up of TRD and anxiety, and insomnia.  seen December 08, 2018.  She was complaining of lithium tremor and some cognitive problems.  Hydroxyzine was stopped.  Propranolol increased to 60 mg twice daily for tremor.  Lithium level and amitriptyline levels were requested. Amitriptyline level to 37, nortriptyline 49 on amitriptyline 250 mg nightly. Lithium level 0.800 mg daily  Phone call on October 6 to discuss lab results as follows: Note   ----- Message from [name]., MD sent at 12/15/2018 10:08 AM EDT ----- Lithium level stable at 0.8 in a good range.  The previous was 0.7.  She is having some tremor issues.   Normal BMP including excellent creatinine and calcium levels.  Blood sugar is high but she is aware of that problem.   TSH is within normal limits.   Serum amitriptyline level is 286 which is at the upper end of the normal range and suggestive that we not try further increase.  We will discuss further options at her follow-up appointment.   No medication adjustments required, but because of her tremor and her lithium level is slightly higher than it was with the prior level if she wants to reduce the lithium from 3 of the 300 mg tablets daily to 2-1/2 of the 300 mg tablets daily she can do so.  At her last appointment she was also encouraged to try a higher dose of propranolol and that may have resolved the problem.  If it did do not reduce the lithium but if it did not and she wants to reduce the dose she can do so as noted.  She should call us if she  has any recurrence of symptoms.   [name], [name]     She had repeat lithium level at 750 mg daily of 0.  7 with some improvement in tremor.  In phone call on November 20 she reported mood was worse with weepiness, overreacting, "pity party".  Because mood symptoms were worse with the reduction lithium she was encouraged to increase the dosage back to 900 mg daily.   Last seen February 04, 2019.  The following changes were made: Increase propranolol to 80 mg twice daily for lithium tremor and anxiety her blood pressure and pulse appear high enough that she can tolerate this dosage.  Disc Se.  Disc risk with low BP and DM.  He has not been having any problems with low blood sugar. Amitriptyline 6 or 300 mg daily if tolerated.  Try to take some about 4 -5 hours before sleep Try to take lithium also 4-5 hours before sleep to minimize daytime tremor Add methylphenidate ER 27 mg in the morning.  If no response we will increase the dosage.  Seen May 08, 2019 with her husband. Aside from physically feeling like she can't do much then having memory issues.  Forgetful.  Loses track of thoughts between tasks.  Most embarrassing being around people and word-finding issues.   Problems with walking bc balance and weakness.  Fears falling.  Only fall at Xmas morning.  Tremor is awful.  Affects keying.   H notices cognitive problems and forgetfulness.    Shaking got really bad and got lithium level as low as 550 mg but the next day felt really helpless and everything seemed to be aimed at hurting me and felt disrespected.  Problems with boss and 57 yo taking 5 AP classes and 2 volleyball classes.  I've been on her to accomplish all these things.  She claimed that pt screaming at her.   H CO she was more irritable after the reduction in lithium.   H CO her anxiety also seems worse.  H says the last year has been unhealthy with 12 hour work days for her.   Doesn't go out much.    Attention problems  more noticeable.  Still dropping and tremor.  Tolerated the increase in propranolol.    CO shakiness from lithium which interferes with typing. Also a good bit of anxiety generally without panic.  BS are high.  NotTaking hydroxyzine.   sleeping better and still using CPAP.  Using CPAP regularly. Less awakening than before.  No SE. depression was worse after reducing the lithium.  Irritability was also worse after reducing the lithium to 550 mg daily.  seen May 08, 2019.  Multiple changes made:  Reduce propranolol to 60 mg twice daily for lithium tremor and anxiety since the increase was not helpful.   Restart B6 for lithium tremor. 500 mg BID. She forgot and doesn't want more. Try to take lithium also 4-5 hours before sleep to minimize daytime tremor. depression was worse after reducing the lithium.  Irritability was also worse after reducing the lithium to 550 mg daily. So reluctant to reduce it further.  It is unclear how to address the benefits of lithium without using lithium other than to consider Spravato  Increase methylphenidate ER 36 mg in the morning. Increase methylphenidate ER 36 mg in the morning.  Wait 1-2 weeks then reduce amitriptyline to 5 tablets nightly to see if cognition is better. Also saw [name] re: sleep disorder.  05/2019 appt with the following noted: Lost Rx of Concerta 36 so didn't take it long.  Out of it.  Still shakey and cognitive problems.  Dep 5/10/  Anxiety 6/10 worse at night.  No SI.  8 hours sleep.  Mild panic couple times daily. Frustrated with tremor and sleepiness which is worse than tiredness.   History of low vitamin D taking 150K/week but stopped 6 mos ago. Plan : Modafinil 100 mg tablet 1 dailly for 1 week, Then add Concerta 36 mg 1 each AM.  Option increase modafinil mid April  Restart B6 for lithium tremor. 500 mg BID. Get lab test at earliest convience  10/14/2019 appointment with the following noted: Has been on amitriptyline 250 mg  for mos.  Tried a week ago reducing to 200 and felt worse so back on 250. Weaned off Lyrica about a month ago.  Not much change off it.   Not sure if it helped energy or alertness. Saw Dr. Nonah Mattes in consultation. Mental clarity is a lot better with stimulants and not sleeping as much in daytime with current meds.  Severe drowsiness is a lot better.   Off metformin and added Glipizide.   Not great with depression 5/10.  Anxiety can trigger problems with thinking and somatic sx   I feel like it's something physical and can keep her up at night. Occ racing heart or  feeling hot.  Caffeine variable.  Plan:Plan: Increase methylphenidate from 36 to 54 mg in the morning. Increase amitriptyline to 6 tablets in the evening    11/12/19 TC noting call: Pt called stated she's not feeling well at all. Ask for sooner apt than 10/7. Depression, anxiety & focus is much worse. Has not planned to hurt herself but thoughts of things would be better if I was not here have crossed her mind often. Contact ASAP @ [phone].  I put Pt on canc list  MD response: I reviewed the meds and previous med lists.  The next options are either Spravato or a major med change from amitriptyline which is not something I can do outside of an appt.  She's on cancellation list and we'll try to work her in sooner.  Nothing I can change right now except since the increase in amitriptyline has not helped, she can reduce it to 200 mg nightly in preparation for other changes to come. Nursing response after talking with pt: Discussed symptoms and medications with patient, she did not increase her Amitriptyline 50 mg up to 6 tablets so she will start that today. Said you may have told her that but she didn't remember. She has read about Spravato but she is not wanting to proceed with that. Informed her she's on cancellation list as well. She will call back with worsening signs or symptoms.  11/23/19 appt with the following noted: Forgot to increase  amitriptyline until noted. Tolerating it OK but hasn't had time to help. More alert with Concerta increase to 54 mg daily also with modafinil.  A little more focused and better productivity.  Still forgetful.   No SE noted except GI issues from diarrhea to constipation.   Still miserable and cry easily.  Try to numb herself.  Always tired especially emotionally.  Anxiety is high and predates current stimulants. No therapy since [facility].  She felt he pushed too hard for her to make changes.  I don't feel like I can deal with him right now. Plan: Continue meds & The increase in amitriptyline to 300 mg daily needs more time.  01/12/2020 appointment with the following noted: Not well.  Mood horrible.  Easily frustrated sometimes without reason and may nap or cry.  Anxiety is pretty bad.  Daytime sleepiness seems worse.  Can get nausea from anxiety.  Feels like function is slow.  Have to write everything down.  Hates home, job, high school volleyball program.  At some point I have to take responsibility.   Amitriptyline also not helping pain any.  Clenching jaw for 3 weeks. Likcking lips.  Picking fingers. D will play volleyball at [employer] [facility] and [place]. Plan: Reduce amitriptyline by 1 tablet every 4 days. Start Viibryd 10 mg daily with food for 7 days, then 20 mg daily for 7 days, then increase to 40 mg daily.  03/18/20 appt urgently made and noted:  Seen with husband. Still with jaw clenching day and night despite off stimulant. Now on Viibryd 60 mg since 03/02/20 and no stimulant., lithium 900 mg, and propranolol 80 BID. Rare hydroxyzine DT sleepiness, Gabapentin 300 TID. Not taking it with food.  LT sensitivity to noise and light and overstimulated and now bothering her more at work.  Always had to use shirts without tags and sensitivity to smells. Easily agitated in public per H.  Gets negative and wants to give up and not live anymore.  She says there's less chance of acting on it  if she tells  H about it and she's doing so now.  Pain worse off amitriptyline esp around ribs and general diffuse pain.  Anxiety worse with stimulants and hasn't gone away.  Trouble staying asleep. Plan: Need to take Viibryd 60 with food emphasized.  She's not currently doing so. Restart low-dose amitriptyline for pain  04/22/2020 appointment with following noted: Only restarted amitriptyline 50 mg last week.  Stays asleep better with it.  Sleep 8-9 hours now but no change in pain yet.  Sleepier in day recently. Done a lot of travel lately. Maybe viibryd making a little difference with mood and anxiety now that has been on it longer.  Needs to take Viibryd in AM bc seems to interfere with sleep. Final season of travel volleyball with good team. H notes still some irritability. Pain worse off amitriptyline.  Can reduce to  amitriptyline 25 for pain if 50 mg remains too sedating.  Is sleeping better.      06/21/2020 appointment with following noted:  Seen with H Tolerating amitriptyline 50 mg HS with decent sleep. Really crappy and as bad as 10 years.  Situationally life sucks.  Work environment is bad with coworkers.  Since Sept work is bad.  Doesn't feel people are supportive at home or work. H working 12 hour days.  When she's off wants to sleep all day.   Still in therapy with Thayer Ohm.  Is good therapist. 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  Check lithium level Referred to Dr. Evelene Croon for [facility]  11/03/2020 appointment with the following noted: 57 yo D got Covid but is OK. Still doing Spravato 84 mg twice weekly and plans to continue for awhile. And for a total of 3 mos.  Tolerated well.  It's a nice feeling.   Didn't seem to help the depression for a couple of months.   It does feel different. SI has resolved lately which is particularly impressive given she  has a lot of free time. She has lot s of time with nothing to do bc quit her job. Job environment got worse and worse.  Had been there 9 years.   Doing PT for pain in her back.  Ongoing physical issues.   Mood clearly better with Spravato but not resolved.   Neck fusion recently. Plan: check lithium level Reduce olanzapine to 5 mg HS  12/30/2020 appointment with the following noted: Surges of anxious feelings in her chest without heart racing several times daily last 15 mins and without pcpt.  Stay a little on edge all the time.   Left job and too much time on her hands. 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  She's doing well statistically.  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 Spravato twice weekly.  Is better with it but still some mild cycling of depression.  Still upset over leaving work.  SI very rare and fleeting.  Had a really good plan but doesn't dwell on the plan. Lithium level 0.7 on 900 mg HS. Reduced olanzapine to 5 mg HS and has felt a little worse. Viibryd 60 mg daily. Amitrptyline 50 for pain.  Not sure if it helps.  PT helped a lot with tension in neck and down spine. Sleeping pretty well after 8 hours. Plan increase olanzapine back to 10 mg nightly for treatment resistant depression and anxiety.  Anxiety was worse after reducing it and no symptoms were improved after reducing it. Try to stop amitriptyline because it is probably not needed in the presence of olanzapine. Check lithium level  03/14/2021 appointment noted: Increase olanzapine to 10 mg for anxiety spells that are unexpected and hit her in the chest esp in the morning. Very low right now.  Twice weekly Spravato and felt good for awhile.  Maybe a slump after Xmas.  Not much social interaction since she quit her job.  Xmas gave her focus.  Doesn't have this now.   Mark working 14-16 hours daily.  This won't get better. Kids are doing great but not around Sleep well generally and that is a benefit of  olanzapine. Tolerates it well.   Plan: Trial increase olanzapine to 15 mg HS  05/03/2021 appointment with the following noted: Still getting Spravato twice weekly.   I love it.  Totally relaxed Depression and anxiety SI is gone or fleeting with Spravato. Still in therapy with Anson Fret and thinks a lot of stuff go back to childhood.  Was always criticized for normal childhood mistakes.  So coped by overcompensating with going big and grand. Can't get energy up to do anything. Never felt she had a manic periods except overstimulated CC anxious feeling in body tingling and tight uncomfortable feeling.  Reminds her of stimulant effects but less intense and worse in AM. Everything still seems to hard like going to grocery store and worry over messing up cooking. Watches too much TV For a little while increase olanzapine seemed to help.  Tolerating it. Depression is not that bad compared to the past. Plan: no med changes  07/17/21 appt noted:  seen with H Depression with twice weekly Spravato. But a lot of general anxiety and then spells of getting tingly and nervous.  Not triggers.   Using propranolol BID. Anxiety is continuation of the way it was before.  Doesn't seem to vary with time of day.  Awakens with anxiety. No SE noted except sleepy.  Sleeping at least 10-11 hours.  Not obviously worse with increase olanzapine.  Viibryd in AM bc insomnia otherwise. Balance trouble with hunched walk and she feels like legs are waker than they should be. Plan: Stop regular propranolol and start ER propranolol ER 1 at night for anxiety Reduce clonidine to 1/2 tablet twice daily Wait 1 week and reduce olanzapine to 1/2 tablet nightly to see if energy, alertness and balance are better. Starting June 1 stop clonidine.  09/05/21 appt noted: Still low energy.  No changes with less meds. Anxiety a little better last week for unclear reasons. She feels olanzapine helps sleep and doesn't want to stop it.   Sleep a lot.  11-11 if she can. Therapist working with her on childhood trauma. Long period of depression. Plan: Start Auvelity 1 daily for a week then 1 twice daily  11/27/21 appt noted:  Couple of phone calls and tremor problems and needed to reduce lithium to 1 and 1/2 tablets and it helped reduce the tremor. Maybe a little improvement.  Not so depressed but kind of numb.   Spells of anxiety but a little better.  Can feel it in chest and arms at times every other night.   Continues spravato twice weekly.   Sleep is great. Off clonidne.  Contiues Aubelity twice daily, lithium 675 mg daily, olanzapine 5 mg HS, viibryd 60 mg daily. Sleep from 11-9 and sleeps well. Plan: Trial reduction in olanzapine to  2.5 mg nightly.  02/14/22 appt noted: Noticed FM got better in summer and then relapsed.  Balance worth with FM also.   Did reduce Spravato from twice weekly to weekly in October.  Did it twice weekly for a year now.  Thinks maybe that is why FM flared.  Continues to weekly. No SI in over a years.  Better dresssed and using makeup now.  Wasn't for about 5 years.. Getting closer to feeling normal .  Depression is still good. Reduced and increased gabapentin to 900 BID. No other med changes except baclofen prn which is marginally helpful. Feels better and got motivated to buy a car.  She's more active and less depressed than she has been.  Gets showered and dress daily before leaving [place]. Less numb emotionally than when last here.  Some excitement. Asks about coming off the Auvelity to reduce meds. A1C is 5.   Sleep is better and using CPAP Plan: no med change.  Continue Auvelity 1 twice daily, continue lithium CR 450 mg tablets 2 nightly, continue olanzapine 2.5 mg nightly, continue propranolol ER 160 mg daily, continue Viibryd 60 mg daily.  05/16/22 appt noted: Meds as above.  Note she was prescribed Mounjaro. switched from gabapentin to Lyrica 150 mg twice daily,  Missed Auvelity 7 days  and felt better when restarted it and I think it helps. Spravato twice weekly early Jan hoping to help pain but didn't. Still dealing with chronic pain esp around abd but also general aches.  Lyrica has helped some No SE Doing well other than pain.  Had a dip in mood late DEC into early Feb without SI but not interested but better now. Interest in cookie decorating and to make for Maggie's wedding in next year. Plan no changes  08/01/22 appt noted: Psych med: Auvelity BID, lithium CR 675 mg nightly, olanzapine 2.5 mg nightly, propranolol ER 160 mg daily, Viibryd 60 mg daily, vitamin D 16109 units weekly.  Also on pregabalin 150 mg every morning and 300 mg nightly for fibromyalgia pain.  Spravato twice weekly. Doing pretty well and better than usual.   Still in [facility] with [facility].  Dealt with feelings worthlessness out of childhood.  Parents were very critical.  Has let a lot of this go. This is best I've felt in a long time.   Tolerating meds. New hobby is baking.   Going to see M in [place] in July which could be triggering.  Sister is there too and much like her. Sister can be critical too.  Her kids are going too.   Had a nice mother's Day affirming.  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   Sleep is good. Plan no changes  10/03/22 appt noted: Psych med: Auvelity BID, lithium CR 675 mg nightly, olanzapine 2.5 mg nightly, propranolol ER 160 mg daily, Viibryd 60 mg daily, vitamin D 60454 units weekly.  Also on pregabalin 150 mg every morning and 300 mg nightly for fibromyalgia pain.  Spravato twice weekly. Hesitant to try cutting back to once weekly Spravato bc upcoming family vacation. Doing therapy .  Father is passed.  M can be dismissive and critical.  Wrote a letter letting go of the past.   M on O2.  Family in [place].   Better energy.  Big family gathering this next week.   Dealing with DM unstable craving.     ECT-MADRS    Flowsheet Row Office Visit  from 05/03/2021 in [facility]  MADRS Total Score 32      PHQ2-9    Flowsheet Row Office Visit from 08/14/2022 in [facility] Visit from 03/22/2022 in [facility] from 02/09/2022 in [facility] from 01[facility]15/2022 in [facility] from 10/13/2021 in [facility]  PHQ-2 Total Score 0 2 0 3 4  PHQ-9 Total Score 0 11 0 10 11      Flowsheet Row Admission (Discharged) from 08/17/2020 in [facility]  [facility] Pre-Admission Testing 62 from 08/16/2020 in [facility] PREADMISSION TESTING  C-SSRS RISK CATEGORY No Risk No Risk       Maggie 53 and dating.  Past Psychiatric Medication Trials:  Failed ECT and TMS,  Spravato started 07/2020 amitriptyline helped for 2 to 3 years in 1987,   2021 No benefit from amitriptyline 300 mg with adequate duration.  Nortriptyline 200 with a therapeutic blood level. Trintellix, duloxetine 120, Lexapro 20, Fetzima,  Wellbutrin, fluoxetine, paroxetine, sertraline,  (Poor response to SSRIs) Viibryd 60 E[place]am,   lithium SE,  Latuda 120, Abilify 15,  Vraylar, Rexulti,Seroquel 400, risperidone, Geodon,  Haldol for agitation, lamotrigine 300,  buspirone, clonazepam, gabapentin, Propranolol, clonidine 0.1 mg BID modafinil,  Nuvigil, Vyvanse, Concerta Pramipexole SE compulsive  SHE DOESN'T WANT STIMULANTS AGAIN bc of anxiety. Trazodone,   GENESIGHT COMPLETED  history of overdose on Xanax, There is a history of suicide attempts and psychiatric hospitalizations.  New PCP good Jacquiline Doe   Review of Systems:  Review of Systems  Constitutional:  Positive for fatigue. unchanged Cardiovascular:  Negative for chest pain and palpitations.  Gastrointestinal:  Negative for diarrhea.  Musculoskeletal:  Positive for back pain and myalgias.  Negative for gait problem.       Less pain lately.  Neurological:  Positive for mild tremors .  Negative for dizziness.  Psychiatric/Behavioral:  Positive for dysphoric mood. Negative for confusion, decreased concentration, hallucinations and self-injury. The patient is nervous/anxious.     Medications: I have reviewed the patient's current medications.  Current Outpatient Medications  Medication Sig Dispense Refill   celecoxib (CELEBREX) 200 MG capsule TAKE ONE CAPSULE TWICE DAILY FOR chronic pain 180 capsule 0   Continuous Blood Gluc Receiver (DEXCOM G6 RECEIVER) DEVI Use 4 times daily as needed to check blood sugar 1 each PRN   Continuous Blood Gluc Receiver (DEXCOM G7 RECEIVER) DEVI Use 4 times daily to check blood sugar 1 each 11   Continuous Blood Gluc Sensor (DEXCOM G6 SENSOR) MISC Apply topically 4 (four) times daily.     Continuous Blood Gluc Transmit (DEXCOM G6 TRANSMITTER) MISC      Continuous Glucose Sensor (DEXCOM G7 SENSOR) MISC USE AS DIRECTED FOUR TIMES DAILY TO CHECK BLOOD SUGAR 1 each 11   Empagliflozin-metFORMIN HCl ER (SYNJARDY XR) 25-1000 MG TB24 Take 1 tablet by mouth daily.     Esketamine HCl, 84 MG Dose, (SPRAVATO, 84 MG DOSE,) 28 MG/DEVICE SOPK Place 84 mg into the nose See admin instructions. Use twice weekly     Ginger, Zingiber officinalis, (GINGER PO) Take by mouth.     Insulin Pen Needle (PEN NEEDLES) 33G X 4 MM MISC Use daily as needed to inject insulin 100 each 1   ipratropium (ATROVENT) 0.03 % nasal spray SMARTSIG:2 Spray(s) Both Nares 3 Times Daily PRN     lidocaine (LIDODERM) 5 % Place 1 patch onto the skin every  12 (twelve) hours. Remove & Discard patch within 12 hours or as directed by MD 30 patch 2   lithium carbonate (ESKALITH) 450 MG ER tablet Take 1.5 tablets (675 mg total) by mouth at bedtime. 135 tablet 1   pantoprazole (PROTONIX) 40 MG tablet TAKE ONE TABLET BY MOUTH ONCE DAILY FOR STOMACH PROTECTION 30 tablet 5   pregabalin (LYRICA) 150 MG capsule  Take 150mg  in the morning and 300mg  in the evening. 90 capsule 5   propranolol ER (INDERAL LA) 160 MG SR capsule Take 1 capsule (160 mg total) by mouth daily. 90 capsule 0   Semaglutide, 2 MG/DOSE, 8 MG/3ML SOPN Inject 2 mg as directed once a week. 3 mL 1   SYNJARDY XR 12.07-998 MG TB24 TAKE TWO TABLETS BY MOUTH DAILY 60 tablet 5   tirzepatide (MOUNJARO) 15 MG/0.5ML Pen Inject 15 mg into the skin once a week. 6 mL 11   tretinoin (RETIN-A) 0.05 % cream SMARTSIG:Sparingly Topical Every Night     tretinoin (RETIN-A) 0.1 % cream nightly.     trimethoprim-polymyxin b (POLYTRIM) ophthalmic solution Place 2 drops into both eyes every 6 (six) hours. 10 mL 0   TURMERIC PO Take by mouth.     Vitamin D, Ergocalciferol, 50000 units CAPS TAKE ONE CAPSULE BY MOUTH EVERY 7 DAYS 15 capsule 3   XARELTO 20 MG TABS tablet TAKE ONE TABLET BY MOUTH DAILY WITH SUPPER 30 tablet 5   Dextromethorphan-buPROPion ER (AUVELITY) 45-105 MG TBCR Take 1 tablet by mouth 2 (two) times daily. 60 tablet 3   OLANZapine (ZYPREXA) 2.5 MG tablet Take 1 tablet (2.5 mg total) by mouth at bedtime. 90 tablet 1   Vilazodone HCl (VIIBRYD) 40 MG TABS Take 1.5 tablets (60 mg total) by mouth daily. 135 tablet 0   No current facility-administered medications for this visit.    Medication Side Effects: Sedation  Allergies:  Allergies  Allergen Reactions   Tramadol Other (See Comments)    Tingling all over    Hydrocodone     Ineffective     Past Medical History:  Diagnosis Date   Anemia    Anxiety    Asthma    Depression    Diabetes mellitus type 2 in obese 06/11/2016   Dyspnea    Embolism - blood clot January 1997 & January 2017   Also had LLE DVT in 1997   History of kidney stones    Hypertension    Preeclampsia 10/10/2011   1999    Sleep apnea     Family History  Problem Relation Age of Onset   Dementia Father    Heart disease Father    Clotting disorder Mother    Arthritis Mother    Clotting disorder Sister     Clotting disorder Maternal Grandmother    Clotting disorder Maternal Aunt    Rheumatologic disease Neg Hx    Hyperparathyroidism Neg Hx     Social History   Socioeconomic History   Marital status: Married    Spouse name: Not on file   Number of children: Y   Years of education: Not on file   Highest education level: Not on file  Occupational History   Occupation: admin assist    Comment: insurance verification  Tobacco Use   Smoking status: Former    Current packs/day: 0.00    Average packs/day: 1 pack/day for 20.0 years (20.0 ttl pk-yrs)    Types: Cigarettes    Start date: 03/12/1984    Quit date:  03/12/2004    Years since quitting: 18.5    Passive exposure: Past   Smokeless tobacco: Never  Vaping Use   Vaping status: Never Used  Substance and Sexual Activity   Alcohol use: Yes    Alcohol/week: 1.0 standard drink of alcohol    Types: 1 Cans of beer per week    Comment: 1-2 times a week   Drug use: No   Sexual activity: Yes    Partners: Male    Birth control/protection: None  Other Topics Concern   Not on file  Social History Narrative   Originally from [place]. Always lived in [place]. 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 No international travel recently. Previously has been to [place] in May 1996. No mold exposure recently. No bird exposure. Does have multiple pets.    Social Determinants of Health   Financial Resource Strain: Not on file  Food Insecurity: Not on file  Transportation Needs: Not on file  Physical Activity: Not on file  Stress: Not on file  Social Connections: Unknown (07/24/2021)   [facility]   Social Network    Social Network: Not on file  Intimate Partner Violence: Unknown (06/15/2021)   Received from [facility]   HITS    Physically Hurt: Not on file    Insult or Talk Down To: Not on file    Threaten Physical Harm: Not on file    Scream or Curse: Not on file    Past Medical History, Surgical history, Social history, and  Family history were reviewed and updated as appropriate.   Please see review of systems for further details on the patient's review from today.   Objective:   Physical Exam:  LMP  (LMP Unknown) Comment: last period ---  1 year ago (?)  Physical Exam Constitutional:      General: She is not in acute distress. Musculoskeletal:        General: No deformity.  Neurological:     Mental Status: She is alert and oriented to person, place, and time.     Cranial Nerves: No dysarthria.     Coordination: Coordination normal.  Psychiatric:        Attention and Perception: Attention and perception normal. She does not perceive auditory or visual hallucinations.        Mood and Affect: Mood is euthymic. Affect is not labile, blunt, angry, tearful or inappropriate.    Not as severe as in the past. Not irritable.    Speech: Speech normal.        Behavior: Behavior normal. Behavior is cooperative.        Thought Content: Thought content normal. Thought content is not paranoid or delusional. Thought content does not include homicidal or suicidal ideation. Thought content does not include suicidal plan.        Cognition and Memory: Cognition and memory normal.        Judgment: Judgment normal.     Comments: Insight intact    Lab Review:     Component Value Date/Time   NA 141 04/11/2022 1054   NA 138 08/21/2019 0000   NA 140 12/11/2016 1236   K 4.5 04/11/2022 1054   K 4.2 12/11/2016 1236   CL 107 04/11/2022 1054   CO2 24 04/11/2022 1054   CO2 24 12/11/2016 1236   GLUCOSE 126 (H) 04/11/2022 1054   GLUCOSE 109 12/11/2016 1236   BUN 14 04/11/2022 1054   BUN 12 08/21/2019 0000   BUN 15.1  12/11/2016 1236   CREATININE 0.70 04/11/2022 1054   CREATININE 0.68 02/22/2020 1649   CREATININE 0.8 12/11/2016 1236   CALCIUM 9.9 04/11/2022 1054   CALCIUM 10.0 12/11/2016 1236   PROT 7.0 04/11/2022 1054   PROT 7.5 12/11/2016 1236   ALBUMIN 4.9 04/11/2022 1054   ALBUMIN 4.4 12/11/2016 1236   AST 30  04/11/2022 1054   AST 38 (H) 12/11/2016 1236   ALT 36 (H) 04/11/2022 1054   ALT 61 (H) 12/11/2016 1236   ALKPHOS 55 04/11/2022 1054   ALKPHOS 71 12/11/2016 1236   BILITOT 0.5 04/11/2022 1054   BILITOT 0.62 12/11/2016 1236   GFRNONAA >60 08/16/2020 1157   GFRNONAA 105 12/11/2018 1121   GFRAA 122 12/11/2018 1121       Component Value Date/Time   WBC 8.6 04/11/2022 1054   RBC 5.01 04/11/2022 1054   HGB 15.0 04/11/2022 1054   HGB 14.5 12/11/2016 1236   HCT 45.2 04/11/2022 1054   HCT 43.9 12/11/2016 1236   PLT 280.0 04/11/2022 1054   PLT 289 12/11/2016 1236   PLT 295 07/24/2016 1848   MCV 90.1 04/11/2022 1054   MCV 93.2 12/11/2016 1236   MCH 29.9 08/16/2020 1157   MCHC 33.1 04/11/2022 1054   RDW 14.5 04/11/2022 1054   RDW 13.5 12/11/2016 1236   LYMPHSABS 2.2 11/22/2020 1223   LYMPHSABS 2.4 12/11/2016 1236   MONOABS 0.6 11/22/2020 1223   MONOABS 0.7 12/11/2016 1236   EOSABS 0.1 11/22/2020 1223   EOSABS 0.7 (H) 12/11/2016 1236   BASOSABS 0.0 11/22/2020 1223   BASOSABS 0.0 12/11/2016 1236    Lithium Lvl  Date Value Ref Range Status  11/22/2020 0.7 0.6 - 1.2 mmol/L Final   11/22/20  lithium 0.7 on 900   Serum nortriptyline level on 150 mg a day was 109  Amitriptyline  Level 12/11/2018 =237  On 250 mg daily,.  12/23/2020 vitamin D level 31.38, vitamin B12 519  No results found for: "PHENYTOIN", "PHENOBARB", "VALPROATE", "CBMZ"   .res Assessment: Plan:    Recurrent major depression resistant to treatment (HCC) - Plan: Dextromethorphan-buPROPion ER (AUVELITY) 45-105 MG TBCR, OLANZapine (ZYPREXA) 2.5 MG tablet, Vilazodone HCl (VIIBRYD) 40 MG TABS  Generalized anxiety disorder - Plan: OLANZapine (ZYPREXA) 2.5 MG tablet, Vilazodone HCl (VIIBRYD) 40 MG TABS  Hypersomnolence  Obstructive sleep apnea  Diffuse pain  Lithium-induced tremor  Low vitamin D level  Lithium use   Jasmene has chronic severe major depression and generalized anxiety that is treatment  resistant and failed multiple medications ECT and TMS as noted above including SSRIs, try cyclic's, lithium, and atypical antipsychotics for depression.. She has had partial benefit from amitriptyline plus lithium.  However  tremor problems from the lithium.  Efforts to reduce the lithium have led to worsening psychiatric symptoms.  There has been a discussion about possible bipolar elements but she has no clear manic episodes unrelated to medication changes.  She has features of atypical depression.  So an MAO inhibitor is attractive from that perspective. Not sig depressed and numbness better.  Good response.  Doing well then.    Discussed Spravato option in detail.  She is continuing Spravato.  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  Consider switch and retry pramipexole and push dose. Less numb  Statistically speaking the most potentially beneficial option would be Parnate or Nardil but they are difficult to use   Need to take Viibryd 60 with food emphasized.  check lithium level 0.7 on 900 mg.  Reuced to 675 DT tremor.  continue Auvelity 1 twice daily BC partial benefit Disc SE.   Her improvement seems to correlate with starting this.  continu counseling.  Good benefit seeing Kennon Rounds.. Counseling 20 min: Great progress dealing with childhood issues.  Helped current perspective.  Read letter in office written to parents, letting go and forgiveness themes. Encourage physical activity and weight loss.  She is not particularly motivated and feels that is hard to accomplish because of chronic pain.  Chronic pain complicates her treatment of depression.   Lyrica helps pain.  Recent B12 deficiency dx and treatment started. vitamin D bc level 31 on 2000U daily  Hold B6 for lithium tremor. 500 mg BID. If needed.  Tremor is better.  None currently.  Try to take lithium also 4-5 hours before sleep to minimize daytime tremor. depression was  worse after reducing the lithium.  Irritability was also worse after reducing the lithium to 550 mg daily.   Disc weith loss meds Monjarou recently started for DM and goals.  Last checked lost 17#.  BS is OK.  Has continuous glucose monitor. More benefit with Monjarou than semaglutide.  Take vitamin D regularly.  No med changes, continue: Auvelity BID, lithium CR 675 mg nightly, olanzapine 2.5 mg nightly, propranolol ER 160 mg daily, Viibryd 60 mg daily, vitamin D 16109 units weekly.  Also on pregabalin 150 mg every morning and 300 mg nightly for fibromyalgia pain.  Spravato twice weekly.  Follow-up 12 weeks  [name], [name]  Future Appointments  Date Time Provider Department Center  01/03/2023  9:30 AM Cottle, Steva Ready., MD [facility] None     No orders of the defined types were placed in this encounter.      -------------------------------

## 2022-10-04 DIAGNOSIS — F332 Major depressive disorder, recurrent severe without psychotic features: Secondary | ICD-10-CM | POA: Diagnosis not present

## 2022-10-15 DIAGNOSIS — F332 Major depressive disorder, recurrent severe without psychotic features: Secondary | ICD-10-CM | POA: Diagnosis not present

## 2022-10-16 DIAGNOSIS — F332 Major depressive disorder, recurrent severe without psychotic features: Secondary | ICD-10-CM | POA: Diagnosis not present

## 2022-10-17 ENCOUNTER — Other Ambulatory Visit: Payer: Self-pay | Admitting: Family Medicine

## 2022-10-17 ENCOUNTER — Other Ambulatory Visit: Payer: Self-pay | Admitting: *Deleted

## 2022-10-18 DIAGNOSIS — F332 Major depressive disorder, recurrent severe without psychotic features: Secondary | ICD-10-CM | POA: Diagnosis not present

## 2022-10-22 DIAGNOSIS — F332 Major depressive disorder, recurrent severe without psychotic features: Secondary | ICD-10-CM | POA: Diagnosis not present

## 2022-10-22 MED ORDER — PREGABALIN 150 MG PO CAPS: ORAL_CAPSULE | ORAL | 5 refills | Status: DC

## 2022-10-25 DIAGNOSIS — F332 Major depressive disorder, recurrent severe without psychotic features: Secondary | ICD-10-CM | POA: Diagnosis not present

## 2022-10-26 ENCOUNTER — Other Ambulatory Visit: Payer: Self-pay | Admitting: Family Medicine

## 2022-10-30 DIAGNOSIS — F332 Major depressive disorder, recurrent severe without psychotic features: Secondary | ICD-10-CM | POA: Diagnosis not present

## 2022-10-31 ENCOUNTER — Encounter: Payer: Self-pay | Admitting: Family Medicine

## 2022-10-31 ENCOUNTER — Telehealth: Payer: Self-pay | Admitting: Family Medicine

## 2022-10-31 ENCOUNTER — Ambulatory Visit: Payer: BC Managed Care – PPO | Admitting: Family Medicine

## 2022-10-31 VITALS — BP 108/74 | HR 84 | Temp 97.5°F | Ht 68.0 in | Wt 221.6 lb

## 2022-10-31 DIAGNOSIS — E669 Obesity, unspecified: Secondary | ICD-10-CM

## 2022-10-31 DIAGNOSIS — R52 Pain, unspecified: Secondary | ICD-10-CM | POA: Diagnosis not present

## 2022-10-31 DIAGNOSIS — E1169 Type 2 diabetes mellitus with other specified complication: Secondary | ICD-10-CM | POA: Diagnosis not present

## 2022-10-31 DIAGNOSIS — Z6833 Body mass index (BMI) 33.0-33.9, adult: Secondary | ICD-10-CM | POA: Diagnosis not present

## 2022-10-31 DIAGNOSIS — Z7985 Long-term (current) use of injectable non-insulin antidiabetic drugs: Secondary | ICD-10-CM | POA: Diagnosis not present

## 2022-10-31 LAB — POCT GLYCOSYLATED HEMOGLOBIN (HGB A1C): Hemoglobin A1C: 6.2 % — AB (ref 4.0–5.6)

## 2022-10-31 MED ORDER — NALTREXONE HCL (PAIN) 4.5 MG PO CAPS: 4.5000 mg | ORAL_CAPSULE | Freq: Two times a day (BID) | ORAL | 3 refills | Status: DC

## 2022-10-31 NOTE — Patient Instructions (Addendum)
It was very nice to see you today!  Your Hemoglobin A1c today is at goal.  We will send in dose naltrexone.  See me message in a few weeks to let me know how this is doing.  Return in about 3 months (around 01/31/2023).   Take care, Dr [name]  PLEASE NOTE:  If you had any lab tests, please let us know if you have not heard back within a few days. You may see your results on mychart before we have a chance to review them but we will give you a call once they are reviewed by [name].   If we ordered any referrals today, please let us know if you have not heard from their office within the next week.   If you had any urgent prescriptions sent in today, please check with the pharmacy within an hour of our visit to make sure the prescription was transmitted appropriately.   Please try these tips to maintain a healthy lifestyle:  Eat at least 3 REAL meals and 1-2 snacks per day.  Aim for no more than 5 hours between eating.  If you eat breakfast, please do so within one hour of getting up.   Each meal should contain half fruits/vegetables, one quarter protein, and one quarter carbs (no bigger than a computer mouse)  Cut down on sweet beverages. This includes juice, soda, and sweet tea.   Drink at least 1 glass of water with each meal and aim for at least 8 glasses per day  Exercise at least 150 minutes every week.

## 2022-10-31 NOTE — Telephone Encounter (Signed)
Pt states the lower dose of  Naltrexone HCl, Pain, 4.5 MG CAPS  Is $90. Please advise.

## 2022-10-31 NOTE — Assessment & Plan Note (Signed)
A1c stable 6.2.  Will continue current regimen mounjaro 15 mg weekly and Synjardy 12.07-998 twice daily.  Recheck A1c in 3 months.

## 2022-10-31 NOTE — Progress Notes (Signed)
Nayleah Bartkiewicz is a 57 y.o. female who presents today for an office visit.  Assessment/Plan:  Chronic Problems Addressed Today: Type 2 diabetes mellitus with obesity (HCC) A1c stable 6.2.  Will continue current regimen mounjaro 15 mg weekly and Synjardy 12.07-998 twice daily.  Recheck A1c in 3 months.  Morbid obesity (HCC) Weight is overall stable at 221.  She does feel like the Greggory Keen recently has been helping with appetite control over the last few weeks after initially not giving her much benefit.  We did discuss alternative options to help her with weight control as well.  She is potentially interested in starting naltrexone.  She has also read that it may help some with her fibromyalgia pain.  She is on several psychiatric medications and we would not be able to add on any other appetite suppressants at this time however would be reasonable to try low-dose naltrexone to see if this helps with appetite control even further.  We will start 4.5 mg twice daily per patient request though did discuss with patient may be reasonable to bump up to 25 or 50 if she does well with this.  She can follow-up with [name] in a few weeks via MyChart and we can adjust the medication as needed.  We did discuss potential side effects.  Diffuse pain Currently doing a fibromyalgia flare.  She has followed with rheumatology in the past for this.  We will continue her Lyrica 150 mg in the morning and 300 mg in the evening.  Will be adding on naltrexone as above which may help some with fibromyalgia pain as well.  She can follow-up with rheumatology if not improving with this though we may be able to adjust the dose of naltrexone as above as well.     Subjective:  HPI:  See A/P for status of chronic conditions.  Patient is here today for follow-up.  We last saw her a few months ago.  At that time was having some issues with sugar being elevated since going back on Ozempic since being off the [facility].  She was  having issues with getting Mounjaro at the pharmacy hence the switch back to Ozempic. She was continued on Synjardy 12.07-998 twice daily.  We tried to get her back on [facility] and she was able to do this about a month ago. Initially she did not feel like this was effective as it was previously however over the last few weeks she feels like it is getting back to where it was previously.  She is concerned that she has continued to gain weight and is interested in discussing other options to help with weight loss and weight management.  She has also had a recurrence of fibromyalgia.  Predominately located in lower extremities at this point.  It is limiting her activities of daily living and limiting her ability to be as active as she would like.  She would like to discuss alternative treatment options for this as well.        Objective:  Physical Exam: BP 108/74   Pulse 84   Temp (!) 97.5 F (36.4 C) (Temporal)   Ht 5\' 8"  (1.727 m)   Wt 221 lb 9.6 oz (100.5 kg)   LMP  (LMP Unknown) Comment: last period ---  1 year ago (?)  SpO2 95%   BMI 33.69 kg/m   Wt Readings from Last 3 Encounters:  10/31/22 221 lb 9.6 oz (100.5 kg)  08/30/22 220 lb 3.2 oz (99.9 kg)  08/14/22 218 lb (98.9 kg)   Gen: No acute distress, resting comfortably Neuro: Grossly normal, moves all extremities Psych: Normal affect and thought content      Izaak Sahr M. [name], MD 10/31/2022 9:08 AM

## 2022-10-31 NOTE — Assessment & Plan Note (Signed)
Currently doing a fibromyalgia flare.  She has followed with rheumatology in the past for this.  We will continue her Lyrica 150 mg in the morning and 300 mg in the evening.  Will be adding on naltrexone as above which may help some with fibromyalgia pain as well.  She can follow-up with rheumatology if not improving with this though we may be able to adjust the dose of naltrexone as above as well.

## 2022-10-31 NOTE — Assessment & Plan Note (Signed)
Weight is overall stable at 221.  She does feel like the Greggory Keen recently has been helping with appetite control over the last few weeks after initially not giving her much benefit.  We did discuss alternative options to help her with weight control as well.  She is potentially interested in starting naltrexone.  She has also read that it may help some with her fibromyalgia pain.  She is on several psychiatric medications and we would not be able to add on any other appetite suppressants at this time however would be reasonable to try low-dose naltrexone to see if this helps with appetite control even further.  We will start 4.5 mg twice daily per patient request though did discuss with patient may be reasonable to bump up to 25 or 50 if she does well with this.  She can follow-up with [name] in a few weeks via MyChart and we can adjust the medication as needed.  We did discuss potential side effects.

## 2022-11-01 DIAGNOSIS — F332 Major depressive disorder, recurrent severe without psychotic features: Secondary | ICD-10-CM | POA: Diagnosis not present

## 2022-11-02 NOTE — Telephone Encounter (Signed)
Message sent thru Mychart advising pt to contact her insurance carrier to see what a cheaper alternative would be and to let us know.

## 2022-11-05 DIAGNOSIS — F332 Major depressive disorder, recurrent severe without psychotic features: Secondary | ICD-10-CM | POA: Diagnosis not present

## 2022-11-06 DIAGNOSIS — F332 Major depressive disorder, recurrent severe without psychotic features: Secondary | ICD-10-CM | POA: Diagnosis not present

## 2022-11-08 DIAGNOSIS — F332 Major depressive disorder, recurrent severe without psychotic features: Secondary | ICD-10-CM | POA: Diagnosis not present

## 2022-11-13 DIAGNOSIS — F332 Major depressive disorder, recurrent severe without psychotic features: Secondary | ICD-10-CM | POA: Diagnosis not present

## 2022-11-15 DIAGNOSIS — F332 Major depressive disorder, recurrent severe without psychotic features: Secondary | ICD-10-CM | POA: Diagnosis not present

## 2022-11-18 ENCOUNTER — Other Ambulatory Visit: Payer: Self-pay | Admitting: Family Medicine

## 2022-11-18 DIAGNOSIS — M797 Fibromyalgia: Secondary | ICD-10-CM

## 2022-11-20 DIAGNOSIS — F332 Major depressive disorder, recurrent severe without psychotic features: Secondary | ICD-10-CM | POA: Diagnosis not present

## 2022-11-26 DIAGNOSIS — F332 Major depressive disorder, recurrent severe without psychotic features: Secondary | ICD-10-CM | POA: Diagnosis not present

## 2022-11-29 DIAGNOSIS — F332 Major depressive disorder, recurrent severe without psychotic features: Secondary | ICD-10-CM | POA: Diagnosis not present

## 2022-12-04 DIAGNOSIS — F332 Major depressive disorder, recurrent severe without psychotic features: Secondary | ICD-10-CM | POA: Diagnosis not present

## 2022-12-05 DIAGNOSIS — F332 Major depressive disorder, recurrent severe without psychotic features: Secondary | ICD-10-CM | POA: Diagnosis not present

## 2022-12-06 DIAGNOSIS — F332 Major depressive disorder, recurrent severe without psychotic features: Secondary | ICD-10-CM | POA: Diagnosis not present

## 2022-12-07 ENCOUNTER — Ambulatory Visit: Payer: BC Managed Care – PPO | Admitting: Family Medicine

## 2022-12-07 ENCOUNTER — Encounter: Payer: Self-pay | Admitting: Family Medicine

## 2022-12-07 ENCOUNTER — Other Ambulatory Visit: Payer: Self-pay

## 2022-12-07 VITALS — BP 110/78 | HR 83 | Ht 68.0 in | Wt 216.0 lb

## 2022-12-07 DIAGNOSIS — M25551 Pain in right hip: Secondary | ICD-10-CM | POA: Diagnosis not present

## 2022-12-07 NOTE — Patient Instructions (Addendum)
Thank you for coming in today.   You received an injection today. Seek immediate medical attention if the joint becomes red, extremely painful, or is oozing fluid.   If this does not work well or last next step is usually an MRI.

## 2022-12-07 NOTE — Progress Notes (Signed)
I, Stevenson Clinch, CMA acting as a scribe for Deborah Graham, MD.  Deborah Soto is a 57 y.o. female who presents to [facility] today for cont'd R hip pain. Pt was last seen by Dr. Denyse Amass on 08/30/22 and she was advised to cont HEP and voltaren gel. She was also prescribed lidocaine patches.   Today, pt reports continued hip . Pt locates pain to lateral aspect, TTP, causing night disturbance. Worse with sit-to-stand. Started Provitalize and Turmeric tea.   Dx imaging: 07/19/22 R hip XR             05/04/20 L-spine MRI             03/14/20 L-spine XR  Pertinent review of systems: No fevers or illness  Relevant historical information: Diabetes   Exam:  BP 110/78   Pulse 83   Ht 5\' 8"  (1.727 m)   Wt 216 lb (98 kg)   LMP  (LMP Unknown) Comment: last period ---  1 year ago (?)  SpO2 97%   BMI 32.84 kg/m  General: Well Developed, well nourished, and in no acute distress.   MSK: Right hip normal-appearing Normal motion. Tender palpation greater trochanter.    Lab and Radiology Results  Procedure: Real-time Ultrasound Guided Injection of right lateral hip greater trochanter bursa Device: Philips Affiniti 50G/GE Logiq Images permanently stored and available for review in PACS Verbal informed consent obtained.  Discussed risks and benefits of procedure. Warned about infection, bleeding, hyperglycemia damage to structures among others. Patient expresses understanding and agreement Time-out conducted.   Noted no overlying erythema, induration, or other signs of local infection.   Skin prepped in a sterile fashion.   Local anesthesia: Topical Ethyl chloride.   With sterile technique and under real time ultrasound guidance: 40 mg of Kenalog and 2 mg of Marcaine injected into trochanter bursa. Fluid seen entering the bursa.   Completed without difficulty   Pain immediately resolved suggesting accurate placement of the medication.   Advised to call if  fevers/chills, erythema, induration, drainage, or persistent bleeding.   Images permanently stored and available for review in the ultrasound unit.  Impression: Technically successful ultrasound guided injection.   EXAM: DG HIP (WITH OR WITHOUT PELVIS) 2-3V RIGHT   COMPARISON:  12/12/2016   FINDINGS: Minimal symmetric degenerative changes of the hips. No evidence of acute fracture or dislocation. No focal bony abnormality. Mild degenerative changes of the spine.   IMPRESSION: 1. No acute findings. 2. Minimal symmetric degenerative change of the hips.     Electronically Signed   By: Elberta Fortis M.D.   On: 07/24/2022 15:05   I, Deborah Soto, personally (independently) visualized and performed the interpretation of the images attached in this note.       Assessment and Plan: 57 y.o. female with right lateral hip pain thought to be primarily due to greater trochanteric bursitis and hip abductor tendinitis.  She is a good candidate for trial of injection.  Over the last 4 months she has had an extensive trial of physical therapy and home exercise program and has failed to improve.  Plan for steroid injection today.  If this does not work well enough next step may be MRI.   PDMP not reviewed this encounter. Orders Placed This Encounter  Procedures   [name] LIMITED JOINT SPACE STRUCTURES LOW RIGHT(NO LINKED CHARGES)    Order Specific Question:   Reason for Exam (SYMPTOM  OR DIAGNOSIS REQUIRED)    Answer:  right hip pain    Order Specific Question:   Preferred imaging location?    Answer:   [facility]   No orders of the defined types were placed in this encounter.    Discussed warning signs or symptoms. Please see discharge instructions. Patient expresses understanding.   The above documentation has been reviewed and is accurate and complete Deborah Soto, M.D.

## 2022-12-10 DIAGNOSIS — F332 Major depressive disorder, recurrent severe without psychotic features: Secondary | ICD-10-CM | POA: Diagnosis not present

## 2022-12-12 DIAGNOSIS — F332 Major depressive disorder, recurrent severe without psychotic features: Secondary | ICD-10-CM | POA: Diagnosis not present

## 2022-12-14 ENCOUNTER — Other Ambulatory Visit: Payer: Self-pay | Admitting: Family Medicine

## 2022-12-14 DIAGNOSIS — Z1211 Encounter for screening for malignant neoplasm of colon: Secondary | ICD-10-CM

## 2022-12-14 DIAGNOSIS — Z1212 Encounter for screening for malignant neoplasm of rectum: Secondary | ICD-10-CM

## 2022-12-16 ENCOUNTER — Other Ambulatory Visit: Payer: Self-pay | Admitting: Family Medicine

## 2022-12-17 DIAGNOSIS — F332 Major depressive disorder, recurrent severe without psychotic features: Secondary | ICD-10-CM | POA: Diagnosis not present

## 2022-12-18 DIAGNOSIS — F332 Major depressive disorder, recurrent severe without psychotic features: Secondary | ICD-10-CM | POA: Diagnosis not present

## 2022-12-20 DIAGNOSIS — F332 Major depressive disorder, recurrent severe without psychotic features: Secondary | ICD-10-CM | POA: Diagnosis not present

## 2022-12-24 DIAGNOSIS — F332 Major depressive disorder, recurrent severe without psychotic features: Secondary | ICD-10-CM | POA: Diagnosis not present

## 2022-12-27 DIAGNOSIS — F332 Major depressive disorder, recurrent severe without psychotic features: Secondary | ICD-10-CM | POA: Diagnosis not present

## 2022-12-31 DIAGNOSIS — F332 Major depressive disorder, recurrent severe without psychotic features: Secondary | ICD-10-CM | POA: Diagnosis not present

## 2023-01-01 DIAGNOSIS — F332 Major depressive disorder, recurrent severe without psychotic features: Secondary | ICD-10-CM | POA: Diagnosis not present

## 2023-01-02 DIAGNOSIS — Z1211 Encounter for screening for malignant neoplasm of colon: Secondary | ICD-10-CM | POA: Diagnosis not present

## 2023-01-02 DIAGNOSIS — Z1212 Encounter for screening for malignant neoplasm of rectum: Secondary | ICD-10-CM | POA: Diagnosis not present

## 2023-01-03 ENCOUNTER — Ambulatory Visit: Payer: BC Managed Care – PPO | Admitting: Psychiatry

## 2023-01-03 ENCOUNTER — Encounter: Payer: Self-pay | Admitting: Psychiatry

## 2023-01-03 DIAGNOSIS — G251 Drug-induced tremor: Secondary | ICD-10-CM

## 2023-01-03 DIAGNOSIS — G4710 Hypersomnia, unspecified: Secondary | ICD-10-CM

## 2023-01-03 DIAGNOSIS — G4733 Obstructive sleep apnea (adult) (pediatric): Secondary | ICD-10-CM | POA: Diagnosis not present

## 2023-01-03 DIAGNOSIS — F339 Major depressive disorder, recurrent, unspecified: Secondary | ICD-10-CM

## 2023-01-03 DIAGNOSIS — Z79899 Other long term (current) drug therapy: Secondary | ICD-10-CM

## 2023-01-03 DIAGNOSIS — R7989 Other specified abnormal findings of blood chemistry: Secondary | ICD-10-CM

## 2023-01-03 DIAGNOSIS — F411 Generalized anxiety disorder: Secondary | ICD-10-CM

## 2023-01-03 DIAGNOSIS — F332 Major depressive disorder, recurrent severe without psychotic features: Secondary | ICD-10-CM | POA: Diagnosis not present

## 2023-01-03 DIAGNOSIS — R52 Pain, unspecified: Secondary | ICD-10-CM

## 2023-01-03 NOTE — Progress Notes (Signed)
[name] [id number] 1965/10/15 57 y.o.     Subjective:   Patient ID:  Deborah Soto is a 57 y.o. (DOB May 16, 1965) female.  Chief Complaint:  Chief Complaint  Patient presents with   Follow-up   Depression   Anxiety   Sleeping Problem   Depression        Associated symptoms include fatigue and myalgias.  Associated symptoms include no decreased concentration.   Deborah Soto presents to the office today for follow-up of TRD and anxiety, and insomnia.  seen December 08, 2018.  She was complaining of lithium tremor and some cognitive problems.  Hydroxyzine was stopped.  Propranolol increased to 60 mg twice daily for tremor.  Lithium level and amitriptyline levels were requested. Amitriptyline level to 37, nortriptyline 49 on amitriptyline 250 mg nightly. Lithium level 0.800 mg daily  Phone call on October 6 to discuss lab results as follows: Note   ----- Message from [name]., MD sent at 12/15/2018 10:08 AM EDT ----- Lithium level stable at 0.8 in a good range.  The previous was 0.7.  She is having some tremor issues.   Normal BMP including excellent creatinine and calcium levels.  Blood sugar is high but she is aware of that problem.   TSH is within normal limits.   Serum amitriptyline level is 286 which is at the upper end of the normal range and suggestive that we not try further increase.  We will discuss further options at her follow-up appointment.   No medication adjustments required, but because of her tremor and her lithium level is slightly higher than it was with the prior level if she wants to reduce the lithium from 3 of the 300 mg tablets daily to 2-1/2 of the 300 mg tablets daily she can do so.  At her last appointment she was also encouraged to try a higher dose of propranolol and that may have resolved the problem.  If it did do not reduce the lithium but if it did not and she wants to reduce the dose she can do so as noted.  She should call  us if she has any recurrence of symptoms.   [name], [name]     She had repeat lithium level at 750 mg daily of 0.  7 with some improvement in tremor.  In phone call on November 20 she reported mood was worse with weepiness, overreacting, "pity party".  Because mood symptoms were worse with the reduction lithium she was encouraged to increase the dosage back to 900 mg daily.   Last seen February 04, 2019.  The following changes were made: Increase propranolol to 80 mg twice daily for lithium tremor and anxiety her blood pressure and pulse appear high enough that she can tolerate this dosage.  Disc Se.  Disc risk with low BP and DM.  He has not been having any problems with low blood sugar. Amitriptyline 6 or 300 mg daily if tolerated.  Try to take some about 4 -5 hours before sleep Try to take lithium also 4-5 hours before sleep to minimize daytime tremor Add methylphenidate ER 27 mg in the morning.  If no response we will increase the dosage.  Seen May 08, 2019 with her husband. Aside from physically feeling like she can't do much then having memory issues.  Forgetful.  Loses track of thoughts between tasks.  Most embarrassing being around people and word-finding issues.   Problems with walking bc balance and weakness.  Fears  falling. Only fall at Xmas morning.  Tremor is awful.  Affects keying.   H notices cognitive problems and forgetfulness.    Shaking got really bad and got lithium level as low as 600 mg but the next day felt really helpless and everything seemed to be aimed at hurting me and felt disrespected.  Problems with boss and 57 yo taking 5 AP classes and 2 volleyball classes.  I've been on her to accomplish all these things.  She claimed that pt screaming at her.   H CO she was more irritable after the reduction in lithium.   H CO her anxiety also seems worse.  H says the last year has been unhealthy with 12 hour work days for her.   Doesn't go out much.    Attention  problems more noticeable.  Still dropping and tremor.  Tolerated the increase in propranolol.    CO shakiness from lithium which interferes with typing. Also a good bit of anxiety generally without panic.  BS are high.  NotTaking hydroxyzine.   sleeping better and still using CPAP.  Using CPAP regularly. Less awakening than before.  No SE. depression was worse after reducing the lithium.  Irritability was also worse after reducing the lithium to 600 mg daily.  seen May 08, 2019.  Multiple changes made:  Reduce propranolol to 60 mg twice daily for lithium tremor and anxiety since the increase was not helpful.   Restart B6 for lithium tremor. 500 mg BID. She forgot and doesn't want more. Try to take lithium also 4-5 hours before sleep to minimize daytime tremor. depression was worse after reducing the lithium.  Irritability was also worse after reducing the lithium to 600 mg daily. So reluctant to reduce it further.  It is unclear how to address the benefits of lithium without using lithium other than to consider Spravato  Increase methylphenidate ER 36 mg in the morning. Increase methylphenidate ER 36 mg in the morning.  Wait 1-2 weeks then reduce amitriptyline to 5 tablets nightly to see if cognition is better. Also saw [name] re: sleep disorder.  05/2019 appt with the following noted: Lost Rx of Concerta 36 so didn't take it long.  Out of it.  Still shakey and cognitive problems.  Dep 5/10/  Anxiety 6/10 worse at night.  No SI.  8 hours sleep.  Mild panic couple times daily. Frustrated with tremor and sleepiness which is worse than tiredness.   History of low vitamin D taking 150K/week but stopped 6 mos ago. Plan : Modafinil 100 mg tablet 1 dailly for 1 week, Then add Concerta 36 mg 1 each AM.  Option increase modafinil mid April  Restart B6 for lithium tremor. 500 mg BID. Get lab test at earliest convience  10/14/2019 appointment with the following noted: Has been on amitriptyline  250 mg for mos.  Tried a week ago reducing to 200 and felt worse so back on 250. Weaned off Lyrica about a month ago.  Not much change off it.   Not sure if it helped energy or alertness. Saw Dr. Nonah Mattes in consultation. Mental clarity is a lot better with stimulants and not sleeping as much in daytime with current meds.  Severe drowsiness is a lot better.   Off metformin and added Glipizide.   Not great with depression 5/10.  Anxiety can trigger problems with thinking and somatic sx   I feel like it's something physical and can keep her up at night. Occ racing heart  or feeling hot.  Caffeine variable.  Plan:Plan: Increase methylphenidate from 36 to 54 mg in the morning. Increase amitriptyline to 6 tablets in the evening    11/12/19 TC noting call: Pt called stated she's not feeling well at all. Ask for sooner apt than 10/7. Depression, anxiety & focus is much worse. Has not planned to hurt herself but thoughts of things would be better if I was not here have crossed her mind often. Contact ASAP @ [phone].  I put Pt on canc list  MD response: I reviewed the meds and previous med lists.  The next options are either Spravato or a major med change from amitriptyline which is not something I can do outside of an appt.  She's on cancellation list and we'll try to work her in sooner.  Nothing I can change right now except since the increase in amitriptyline has not helped, she can reduce it to 200 mg nightly in preparation for other changes to come. Nursing response after talking with pt: Discussed symptoms and medications with patient, she did not increase her Amitriptyline 50 mg up to 6 tablets so she will start that today. Said you may have told her that but she didn't remember. She has read about Spravato but she is not wanting to proceed with that. Informed her she's on cancellation list as well. She will call back with worsening signs or symptoms.  11/23/19 appt with the following noted: Forgot to  increase amitriptyline until noted. Tolerating it OK but hasn't had time to help. More alert with Concerta increase to 54 mg daily also with modafinil.  A little more focused and better productivity.  Still forgetful.   No SE noted except GI issues from diarrhea to constipation.   Still miserable and cry easily.  Try to numb herself.  Always tired especially emotionally.  Anxiety is high and predates current stimulants. No therapy since [facility].  She felt he pushed too hard for her to make changes.  I don't feel like I can deal with him right now. Plan: Continue meds & The increase in amitriptyline to 300 mg daily needs more time.  01/12/2020 appointment with the following noted: Not well.  Mood horrible.  Easily frustrated sometimes without reason and may nap or cry.  Anxiety is pretty bad.  Daytime sleepiness seems worse.  Can get nausea from anxiety.  Feels like function is slow.  Have to write everything down.  Hates home, job, high school volleyball program.  At some point I have to take responsibility.   Amitriptyline also not helping pain any.  Clenching jaw for 3 weeks. Likcking lips.  Picking fingers. D will play volleyball at [facility] and [place]. Plan: Reduce amitriptyline by 1 tablet every 4 days. Start Viibryd 10 mg daily with food for 7 days, then 20 mg daily for 7 days, then increase to 40 mg daily.  03/18/20 appt urgently made and noted:  Seen with husband. Still with jaw clenching day and night despite off stimulant. Now on Viibryd 60 mg since 03/02/20 and no stimulant., lithium 900 mg, and propranolol 80 BID. Rare hydroxyzine DT sleepiness, Gabapentin 300 TID. Not taking it with food.  LT sensitivity to noise and light and overstimulated and now bothering her more at work.  Always had to use shirts without tags and sensitivity to smells. Easily agitated in public per H.  Gets negative and wants to give up and not live anymore.  She says there's less chance of acting on  it if  she tells H about it and she's doing so now.  Pain worse off amitriptyline esp around ribs and general diffuse pain.  Anxiety worse with stimulants and hasn't gone away.  Trouble staying asleep. Plan: Need to take Viibryd 60 with food emphasized.  She's not currently doing so. Restart low-dose amitriptyline for pain  04/22/2020 appointment with following noted: Only restarted amitriptyline 50 mg last week.  Stays asleep better with it.  Sleep 8-9 hours now but no change in pain yet.  Sleepier in day recently. Done a lot of travel lately. Maybe viibryd making a little difference with mood and anxiety now that has been on it longer.  Needs to take Viibryd in AM bc seems to interfere with sleep. Final season of travel volleyball with good team. H notes still some irritability. Pain worse off amitriptyline.  Can reduce to  amitriptyline 25 for pain if 50 mg remains too sedating.  Is sleeping better.      06/21/2020 appointment with following noted:  Seen with H Tolerating amitriptyline 50 mg HS with decent sleep. Really crappy and as bad as 10 years.  Situationally life sucks.  Work environment is bad with coworkers.  Since Sept work is bad.  Doesn't feel people are supportive at home or work. H working 12 hour days.  When she's off wants to sleep all day.   Still in therapy with Thayer Ohm.  Is good therapist. 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  Check lithium level Referred to Dr. Evelene Croon for [facility]  11/03/2020 appointment with the following noted: 57 yo D got Covid but is OK. Still doing Spravato 84 mg twice weekly and plans to continue for awhile. And for a total of 3 mos.  Tolerated well.  It's a nice feeling.   Didn't seem to help the depression for a couple of months.   It does feel different. SI has resolved lately which is particularly impressive  given she has a lot of free time. She has lot s of time with nothing to do bc quit her job. Job environment got worse and worse.  Had been there 9 years.   Doing PT for pain in her back.  Ongoing physical issues.   Mood clearly better with Spravato but not resolved.   Neck fusion recently. Plan: check lithium level Reduce olanzapine to 5 mg HS  12/30/2020 appointment with the following noted: Surges of anxious feelings in her chest without heart racing several times daily last 15 mins and without pcpt.  Stay a little on edge all the time.   Left job and too much time on her hands. Youngest at [employer] and [employer] in new position.  She's doing well statistically.  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 Spravato twice weekly.  Is better with it but still some mild cycling of depression.  Still upset over leaving work.  SI very rare and fleeting.  Had a really good plan but doesn't dwell on the plan. Lithium level 0.7 on 900 mg HS. Reduced olanzapine to 5 mg HS and has felt a little worse. Viibryd 60 mg daily. Amitrptyline 50 for pain.  Not sure if it helps.  PT helped a lot with tension in neck and down spine. Sleeping pretty well after 8 hours. Plan increase olanzapine back to 10 mg nightly for treatment resistant depression and anxiety.  Anxiety was worse after reducing it and no symptoms were improved after reducing it. Try to stop amitriptyline because it is probably not needed in the presence of olanzapine. Check lithium level  03/14/2021 appointment noted: Increase olanzapine to 10 mg for anxiety spells that are unexpected and hit her in the chest esp in the morning. Very low right now.  Twice weekly Spravato and felt good for awhile.  Maybe a slump after Xmas.  Not much social interaction since she quit her job.  Xmas gave her focus.  Doesn't have this now.   Mark working 14-16 hours daily.  This won't get better. Kids are doing great but not around Sleep well generally and that is a benefit  of olanzapine. Tolerates it well.   Plan: Trial increase olanzapine to 15 mg HS  05/03/2021 appointment with the following noted: Still getting Spravato twice weekly.   I love it.  Totally relaxed Depression and anxiety SI is gone or fleeting with Spravato. Still in therapy with Anson Fret and thinks a lot of stuff go back to childhood.  Was always criticized for normal childhood mistakes.  So coped by overcompensating with going big and grand. Can't get energy up to do anything. Never felt she had a manic periods except overstimulated CC anxious feeling in body tingling and tight uncomfortable feeling.  Reminds her of stimulant effects but less intense and worse in AM. Everything still seems to hard like going to grocery store and worry over messing up cooking. Watches too much TV For a little while increase olanzapine seemed to help.  Tolerating it. Depression is not that bad compared to the past. Plan: no med changes  07/17/21 appt noted:  seen with H Depression with twice weekly Spravato. But a lot of general anxiety and then spells of getting tingly and nervous.  Not triggers.   Using propranolol BID. Anxiety is continuation of the way it was before.  Doesn't seem to vary with time of day.  Awakens with anxiety. No SE noted except sleepy.  Sleeping at least 10-11 hours.  Not obviously worse with increase olanzapine.  Viibryd in AM bc insomnia otherwise. Balance trouble with hunched walk and she feels like legs are waker than they should be. Plan: Stop regular propranolol and start ER propranolol ER 1 at night for anxiety Reduce clonidine to 1/2 tablet twice daily Wait 1 week and reduce olanzapine to 1/2 tablet nightly to see if energy, alertness and balance are better. Starting June 1 stop clonidine.  09/05/21 appt noted: Still low energy.  No changes with less meds. Anxiety a little better last week for unclear reasons. She feels olanzapine helps sleep and doesn't want to stop  it.  Sleep a lot.  11-11 if she can. Therapist working with her on childhood trauma. Long period of depression. Plan: Start Auvelity 1 daily for a week then 1 twice daily  11/27/21 appt noted:  Couple of phone calls and tremor problems and needed to reduce lithium to 1 and 1/2 tablets and it helped reduce the tremor. Maybe a little improvement.  Not so depressed but kind of numb.   Spells of anxiety but a little better.  Can feel it in chest and arms at times every other night.   Continues spravato twice weekly.   Sleep is great. Off clonidne.  Contiues Aubelity twice daily, lithium 675 mg daily, olanzapine 5 mg HS, viibryd 60 mg daily. Sleep from 11-9 and sleeps well. Plan: Trial reduction in olanzapine to  2.5 mg nightly.  02/14/22 appt noted: Noticed FM got better in summer and then relapsed.  Balance worth with FM also.   Did reduce Spravato from twice weekly to weekly in October.  Did it twice weekly for a year now.  Thinks maybe that is why FM flared.  Continues to weekly. No SI in over a years.  Better dresssed and using makeup now.  Wasn't for about 5 years.. Getting closer to feeling normal .  Depression is still good. Reduced and increased gabapentin to 900 BID. No other med changes except baclofen prn which is marginally helpful. Feels better and got motivated to buy a car.  She's more active and less depressed than she has been.  Gets showered and dress daily before leaving [place]. Less numb emotionally than when last here.  Some excitement. Asks about coming off the Auvelity to reduce meds. A1C is 5.   Sleep is better and using CPAP Plan: no med change.  Continue Auvelity 1 twice daily, continue lithium CR 450 mg tablets 2 nightly, continue olanzapine 2.5 mg nightly, continue propranolol ER 160 mg daily, continue Viibryd 60 mg daily.  05/16/22 appt noted: Meds as above.  Note she was prescribed Mounjaro. switched from gabapentin to Lyrica 150 mg twice daily,  Missed Auvelity 7  days and felt better when restarted it and I think it helps. Spravato twice weekly early Jan hoping to help pain but didn't. Still dealing with chronic pain esp around abd but also general aches.  Lyrica has helped some No SE Doing well other than pain.  Had a dip in mood late DEC into early Feb without SI but not interested but better now. Interest in cookie decorating and to make for Maggie's wedding in next year. Plan no changes  08/01/22 appt noted: Psych med: Auvelity BID, lithium CR 675 mg nightly, olanzapine 2.5 mg nightly, propranolol ER 160 mg daily, Viibryd 60 mg daily, vitamin D 91478 units weekly.  Also on pregabalin 150 mg every morning and 300 mg nightly for fibromyalgia pain.  Spravato twice weekly. Doing pretty well and better than usual.   Still in therapy with [facility].  Dealt with feelings worthlessness out of childhood.  Parents were very critical.  Has let a lot of this go. This is best I've felt in a long time.   Tolerating meds. New hobby is baking.   Going to see M in [place] in July which could be triggering.  Sister is there too and much like her. Sister can be critical too.  Her kids are going too.   Had a nice mother's Day affirming.  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   Sleep is good. Plan no changes  10/03/22 appt noted: Psych med: Auvelity BID, lithium CR 675 mg nightly, olanzapine 2.5 mg nightly, propranolol ER 160 mg daily, Viibryd 60 mg daily, vitamin D 29562 units weekly.  Also on pregabalin 150 mg every morning and 300 mg nightly for fibromyalgia pain.  Spravato twice weekly. Hesitant to try cutting back to once weekly Spravato bc upcoming family vacation. Doing therapy .  Father is passed.  M can be dismissive and critical.  Wrote a letter letting go of the past.   M on O2.  Family in [place].   Better energy.  Big family gathering this next week.   Dealing with DM unstable craving.   Plan no med changes  01/03/23 appt  noted; No med changes, continue: Auvelity BID,  lithium CR 675 mg nightly, olanzapine 2.5 mg nightly, propranolol ER 160 mg daily, Viibryd 60 mg daily, vitamin D 53664 units weekly.  Also on pregabalin 150 mg every morning and 300 mg nightly for fibromyalgia pain.  Spravato twice weekly. Would like to cut Spravato to once weekly bc has things to do.  Involved in campaigns.  Passionate about it.   Mood is much better.  Doing really well overall and seeing Thayer Ohm every other week.   Made big progress and feels like a blanket taken off.  Write a lot of therapeutic letters.  Gotten very busy since then.  Will start baking again and [employer] business.  Enjoying the research.   Involved in early voting outreach.  D's teachers. Couple of times forgetting night time meds.  Poor sleep if forgets night meds.   Having fun in life.    ECT-MADRS    Flowsheet Row Office Visit from 05/03/2021 in [facility]  MADRS Total Score 32      PHQ2-9    Flowsheet Row Office Visit from 10/31/2022 in [facility] from 08/14/2022 in [facility] from 03/22/2022 in [facility] from 02/09/2022 in [facility] from 01/15/2022 in [facility]  PHQ-2 Total Score 0 0 2 0 3  PHQ-9 Total Score 0 0 11 0 10      Flowsheet Row Admission (Discharged) from 08/17/2020 in [facility]  [facility] Pre-Admission Testing 62 from 08/16/2020 in [facility] PREADMISSION TESTING  C-SSRS RISK CATEGORY No Risk No Risk       Maggie 75 and dating.  Past Psychiatric Medication Trials:  Failed ECT and TMS,  Spravato started 07/2020 amitriptyline helped for 2 to 3 years in 1987,   2021 No benefit from amitriptyline 300 mg with adequate duration.  Nortriptyline 200 with a therapeutic blood level. Trintellix, duloxetine 120,  Lexapro 20, Fetzima,  Wellbutrin, fluoxetine, paroxetine, sertraline,  (Poor response to SSRIs) Viibryd 60 [place],   lithium SE,  Latuda 120, Abilify 15,  Vraylar, Rexulti,Seroquel 400, risperidone, Geodon,  Haldol for agitation, lamotrigine 300,  buspirone, clonazepam, gabapentin, Propranolol, clonidine 0.1 mg BID modafinil,  Nuvigil, Vyvanse, Concerta Pramipexole SE compulsive  SHE DOESN'T WANT STIMULANTS AGAIN bc of anxiety. Trazodone,   GENESIGHT COMPLETED  history of overdose on Xanax, There is a history of suicide attempts and psychiatric hospitalizations.  New PCP good Jacquiline Doe   Review of Systems:  Review of Systems  Constitutional:  Positive for fatigue. unchanged Cardiovascular:  Negative for chest pain and palpitations.  Gastrointestinal:  Negative for diarrhea.  Musculoskeletal:  Positive for back pain and myalgias. Negative for gait problem.       Less pain lately.  Neurological:  Positive for mild tremors .  Negative for dizziness.  Psychiatric/Behavioral:  Positive for dysphoric mood. Negative for confusion, decreased concentration, hallucinations and self-injury. The patient is nervous/anxious.     Medications: I have reviewed the patient's current medications.  Current Outpatient Medications  Medication Sig Dispense Refill   celecoxib (CELEBREX) 200 MG capsule TAKE ONE CAPSULE TWICE DAILY FOR chronic pain 180 capsule 0   Continuous Blood Gluc Receiver (DEXCOM G6 RECEIVER) DEVI Use 4 times daily as needed to check blood sugar 1 each PRN   Continuous Blood Gluc Receiver (DEXCOM G7 RECEIVER) DEVI Use 4 times daily to check blood sugar 1 each 11  Continuous Blood Gluc Sensor (DEXCOM G6 SENSOR) MISC Apply topically 4 (four) times daily.     Continuous Blood Gluc Transmit (DEXCOM G6 TRANSMITTER) MISC      Continuous Glucose Sensor (DEXCOM G7 SENSOR) MISC USE AS DIRECTED FOUR TIMES DAILY TO CHECK BLOOD SUGAR 1 each 11   Dextromethorphan-buPROPion ER  (AUVELITY) 45-105 MG TBCR Take 1 tablet by mouth 2 (two) times daily. 60 tablet 3   Empagliflozin-metFORMIN HCl ER (SYNJARDY XR) 25-1000 MG TB24 Take 1 tablet by mouth daily.     Esketamine HCl, 84 MG Dose, (SPRAVATO, 84 MG DOSE,) 28 MG/DEVICE SOPK Place 84 mg into the nose See admin instructions. Use twice weekly     Ginger, Zingiber officinalis, (GINGER PO) Take by mouth.     Insulin Pen Needle (PEN NEEDLES) 33G X 4 MM MISC Use daily as needed to inject insulin 100 each 1   ipratropium (ATROVENT) 0.03 % nasal spray SMARTSIG:2 Spray(s) Both Nares 3 Times Daily PRN     lidocaine (LIDODERM) 5 % Place 1 patch onto the skin every 12 (twelve) hours. Remove & Discard patch within 12 hours or as directed by MD 30 patch 2   lithium carbonate (ESKALITH) 450 MG ER tablet Take 1.5 tablets (675 mg total) by mouth at bedtime. 135 tablet 1   Naltrexone HCl, Pain, 4.5 MG CAPS Take 4.5 mg by mouth in the morning and at bedtime. 60 capsule 3   OLANZapine (ZYPREXA) 2.5 MG tablet Take 1 tablet (2.5 mg total) by mouth at bedtime. 90 tablet 1   pantoprazole (PROTONIX) 40 MG tablet TAKE ONE TABLET BY MOUTH ONCE DAILY FOR STOMACH PROTECTION 30 tablet 5   pregabalin (LYRICA) 150 MG capsule TAKE ONE CAPSULE BY MOUTH IN THE MORNING AND TAKE TWO CAPSULES IN THE EVENING 90 capsule 5   propranolol ER (INDERAL LA) 160 MG SR capsule Take 1 capsule (160 mg total) by mouth daily. 90 capsule 0   SYNJARDY XR 12.07-998 MG TB24 TAKE TWO TABLETS BY MOUTH DAILY 60 tablet 5   tirzepatide (MOUNJARO) 15 MG/0.5ML Pen Inject 15 mg into the skin once a week. 6 mL 11   tretinoin (RETIN-A) 0.05 % cream SMARTSIG:Sparingly Topical Every Night     tretinoin (RETIN-A) 0.1 % cream nightly.     trimethoprim-polymyxin b (POLYTRIM) ophthalmic solution Place 2 drops into both eyes every 6 (six) hours. 10 mL 0   TURMERIC PO Take by mouth.     Vilazodone HCl (VIIBRYD) 40 MG TABS Take 1.5 tablets (60 mg total) by mouth daily. 135 tablet 0   Vitamin D,  Ergocalciferol, 50000 units CAPS TAKE ONE CAPSULE BY MOUTH EVERY 7 DAYS 15 capsule 3   XARELTO 20 MG TABS tablet TAKE ONE TABLET BY MOUTH DAILY WITH SUPPER 30 tablet 5   No current facility-administered medications for this visit.    Medication Side Effects: Sedation  Allergies:  Allergies  Allergen Reactions   Tramadol Other (See Comments)    Tingling all over    Hydrocodone     Ineffective     Past Medical History:  Diagnosis Date   Anemia    Anxiety    Asthma    Depression    Diabetes mellitus type 2 in obese 06/11/2016   Dyspnea    Embolism - blood clot January 1997 & January 2017   Also had LLE DVT in 1997   History of kidney stones    Hypertension    Preeclampsia 10/10/2011   1999  Sleep apnea     Family History  Problem Relation Age of Onset   Dementia Father    Heart disease Father    Clotting disorder Mother    Arthritis Mother    Clotting disorder Sister    Clotting disorder Maternal Grandmother    Clotting disorder Maternal Aunt    Rheumatologic disease Neg Hx    Hyperparathyroidism Neg Hx     Social History   Socioeconomic History   Marital status: Married    Spouse name: Not on file   Number of children: Y   Years of education: Not on file   Highest education level: Not on file  Occupational History   Occupation: admin assist    Comment: insurance verification  Tobacco Use   Smoking status: Former    Current packs/day: 0.00    Average packs/day: 1 pack/day for 20.0 years (20.0 ttl pk-yrs)    Types: Cigarettes    Start date: 03/12/1984    Quit date: 03/12/2004    Years since quitting: 18.8    Passive exposure: Past   Smokeless tobacco: Never  Vaping Use   Vaping status: Never Used  Substance and Sexual Activity   Alcohol use: Yes    Alcohol/week: 1.0 standard drink of alcohol    Types: 1 Cans of beer per week    Comment: 1-2 times a week   Drug use: No   Sexual activity: Yes    Partners: Male    Birth control/protection: None   Other Topics Concern   Not on file  Social History Narrative   Originally from [place]. Always lived in [place]. 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 No international travel recently. Previously has been to [place] in May 1996. No mold exposure recently. No bird exposure. Does have multiple pets.    Social Determinants of Health   Financial Resource Strain: Not on file  Food Insecurity: Not on file  Transportation Needs: Not on file  Physical Activity: Not on file  Stress: Not on file  Social Connections: Unknown (07/24/2021)   Received [facility], [facility]   Social Network    Social Network: Not on file  Intimate Partner Violence: Unknown (06/15/2021)   Received from [facility], [facility]   HITS    Physically Hurt: Not on file    Insult or Talk Down To: Not on file    Threaten Physical Harm: Not on file    Scream or Curse: Not on file    Past Medical History, Surgical history, Social history, and Family history were reviewed and updated as appropriate.   Please see review of systems for further details on the patient's review from today.   Objective:   Physical Exam:  LMP  (LMP Unknown) Comment: last period ---  1 year ago (?)  Physical Exam Constitutional:      General: She is not in acute distress. Musculoskeletal:        General: No deformity.  Neurological:     Mental Status: She is alert and oriented to person, place, and time.     Cranial Nerves: No dysarthria.     Coordination: Coordination normal.  Psychiatric:        Attention and Perception: Attention and perception normal. She does not perceive auditory or visual hallucinations.        Mood and Affect: Mood is euthymic. Affect is not labile, blunt, angry, tearful or inappropriate.    Not as severe as in the past. Not irritable.  Speech: Speech normal.        Behavior: Behavior normal. Behavior is cooperative.        Thought Content: Thought content normal. Thought content is not  paranoid or delusional. Thought content does not include homicidal or suicidal ideation. Thought content does not include suicidal plan.        Cognition and Memory: Cognition and memory normal.        Judgment: Judgment normal.     Comments: Insight intact    Lab Review:     Component Value Date/Time   NA 141 04/11/2022 1054   NA 138 08/21/2019 0000   NA 140 12/11/2016 1236   K 4.5 04/11/2022 1054   K 4.2 12/11/2016 1236   CL 107 04/11/2022 1054   CO2 24 04/11/2022 1054   CO2 24 12/11/2016 1236   GLUCOSE 126 (H) 04/11/2022 1054   GLUCOSE 109 12/11/2016 1236   BUN 14 04/11/2022 1054   BUN 12 08/21/2019 0000   BUN 15.1 12/11/2016 1236   CREATININE 0.70 04/11/2022 1054   CREATININE 0.68 02/22/2020 1649   CREATININE 0.8 12/11/2016 1236   CALCIUM 9.9 04/11/2022 1054   CALCIUM 10.0 12/11/2016 1236   PROT 7.0 04/11/2022 1054   PROT 7.5 12/11/2016 1236   ALBUMIN 4.9 04/11/2022 1054   ALBUMIN 4.4 12/11/2016 1236   AST 30 04/11/2022 1054   AST 38 (H) 12/11/2016 1236   ALT 36 (H) 04/11/2022 1054   ALT 61 (H) 12/11/2016 1236   ALKPHOS 55 04/11/2022 1054   ALKPHOS 71 12/11/2016 1236   BILITOT 0.5 04/11/2022 1054   BILITOT 0.62 12/11/2016 1236   GFRNONAA >60 08/16/2020 1157   GFRNONAA 105 12/11/2018 1121   GFRAA 122 12/11/2018 1121       Component Value Date/Time   WBC 8.6 04/11/2022 1054   RBC 5.01 04/11/2022 1054   HGB 15.0 04/11/2022 1054   HGB 14.5 12/11/2016 1236   HCT 45.2 04/11/2022 1054   HCT 43.9 12/11/2016 1236   PLT 280.0 04/11/2022 1054   PLT 289 12/11/2016 1236   PLT 295 07/24/2016 1848   MCV 90.1 04/11/2022 1054   MCV 93.2 12/11/2016 1236   MCH 29.9 08/16/2020 1157   MCHC 33.1 04/11/2022 1054   RDW 14.5 04/11/2022 1054   RDW 13.5 12/11/2016 1236   LYMPHSABS 2.2 11/22/2020 1223   LYMPHSABS 2.4 12/11/2016 1236   MONOABS 0.6 11/22/2020 1223   MONOABS 0.7 12/11/2016 1236   EOSABS 0.1 11/22/2020 1223   EOSABS 0.7 (H) 12/11/2016 1236   BASOSABS 0.0  11/22/2020 1223   BASOSABS 0.0 12/11/2016 1236    Lithium Lvl  Date Value Ref Range Status  11/22/2020 0.7 0.6 - 1.2 mmol/L Final   11/22/20  lithium 0.7 on 900   Serum nortriptyline level on 150 mg a day was 109  Amitriptyline  Level 12/11/2018 =237  On 250 mg daily,.  12/23/2020 vitamin D level 31.38, vitamin B12 519  No results found for: "PHENYTOIN", "PHENOBARB", "VALPROATE", "CBMZ"   .res Assessment: Plan:    Recurrent major depression resistant to treatment (HCC)  Generalized anxiety disorder  Hypersomnolence  Obstructive sleep apnea  Diffuse pain  Lithium-induced tremor  Low vitamin D level  Lithium use   Lyndsie has chronic severe major depression and generalized anxiety that is treatment resistant and failed multiple medications ECT and TMS as noted above including SSRIs, try cyclic's, lithium, and atypical antipsychotics for depression.. She has had partial benefit from amitriptyline plus lithium.  However  tremor problems from the lithium.  Efforts to reduce the lithium have led to worsening psychiatric symptoms.  There has been a discussion about possible bipolar elements but she has no clear manic episodes unrelated to medication changes.  She has features of atypical depression.  So an MAO inhibitor is attractive from that perspective. Not sig depressed and numbness better.  Good response.  Doing well then.    Discussed Spravato option in detail.  She is continuing Spravato.  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  Consider switch and retry pramipexole and push dose. Less numb  Statistically speaking the most potentially beneficial option would be Parnate or Nardil but they are difficult to use   Need to take Viibryd 60 with food emphasized.   check lithium level 0.7 on 900 mg.  Reuced to 675 DT tremor. Consider wean after holidays.  continue Auvelity 1 twice daily BC partial benefit Disc SE.   Her  improvement seems to correlate with starting this.  continu counseling.  Good benefit seeing Kennon Rounds.. Counseling 20 min: Great progress dealing with childhood issues.  Helped current perspective.  Read letter in office written to parents, letting go and forgiveness themes. Encourage physical activity and weight loss.  She is not particularly motivated and feels that is hard to accomplish because of chronic pain.  Chronic pain complicates her treatment of depression.   Lyrica helps pain.  Recent B12 deficiency dx and treatment started. vitamin D bc level 31 on 2000U daily  Hold B6 for lithium tremor. 500 mg BID. If needed.  Tremor is better.  None currently.  Try to take lithium also 4-5 hours before sleep to minimize daytime tremor. depression was worse after reducing the lithium.  Irritability was also worse after reducing the lithium to 600 mg daily.   Disc weith loss meds Monjarou recently started for DM and goals.  Last checked lost 17#.  BS is OK.  Has continuous glucose monitor. More benefit with Monjarou than semaglutide.  Take vitamin D regularly.  No med changes, continue: Auvelity BID, lithium CR 675 mg nightly, olanzapine 2.5 mg nightly, propranolol ER 160 mg daily, Viibryd 60 mg daily, vitamin D 91478 units weekly.  Also on pregabalin 150 mg every morning and 300 mg nightly for fibromyalgia pain.  Spravato twice weekly.  Follow-up 12 weeks  [name], [name]  Future Appointments  Date Time Provider Department Center  02/13/2023  9:00 AM Ardith Dark, MD [facility]     No orders of the defined types were placed in this encounter.      -------------------------------

## 2023-01-07 DIAGNOSIS — F332 Major depressive disorder, recurrent severe without psychotic features: Secondary | ICD-10-CM | POA: Diagnosis not present

## 2023-01-12 LAB — COLOGUARD: COLOGUARD: NEGATIVE

## 2023-01-15 ENCOUNTER — Other Ambulatory Visit: Payer: Self-pay | Admitting: Psychiatry

## 2023-01-15 DIAGNOSIS — F339 Major depressive disorder, recurrent, unspecified: Secondary | ICD-10-CM

## 2023-01-15 DIAGNOSIS — F411 Generalized anxiety disorder: Secondary | ICD-10-CM

## 2023-01-15 DIAGNOSIS — F332 Major depressive disorder, recurrent severe without psychotic features: Secondary | ICD-10-CM | POA: Diagnosis not present

## 2023-01-15 NOTE — Telephone Encounter (Signed)
LF 10/16; I changed the refill date to 11/13; lv 10/24; nv 1/13

## 2023-01-16 DIAGNOSIS — F332 Major depressive disorder, recurrent severe without psychotic features: Secondary | ICD-10-CM | POA: Diagnosis not present

## 2023-01-16 NOTE — Progress Notes (Signed)
Good news! Cologuard is negative. We can recheck in 3 years.

## 2023-01-18 ENCOUNTER — Other Ambulatory Visit: Payer: Self-pay | Admitting: Psychiatry

## 2023-01-18 DIAGNOSIS — F411 Generalized anxiety disorder: Secondary | ICD-10-CM

## 2023-01-29 DIAGNOSIS — F332 Major depressive disorder, recurrent severe without psychotic features: Secondary | ICD-10-CM | POA: Diagnosis not present

## 2023-02-05 DIAGNOSIS — F332 Major depressive disorder, recurrent severe without psychotic features: Secondary | ICD-10-CM | POA: Diagnosis not present

## 2023-02-06 DIAGNOSIS — F332 Major depressive disorder, recurrent severe without psychotic features: Secondary | ICD-10-CM | POA: Diagnosis not present

## 2023-02-12 DIAGNOSIS — F332 Major depressive disorder, recurrent severe without psychotic features: Secondary | ICD-10-CM | POA: Diagnosis not present

## 2023-02-13 ENCOUNTER — Ambulatory Visit: Payer: BC Managed Care – PPO | Admitting: Family Medicine

## 2023-02-13 ENCOUNTER — Encounter: Payer: Self-pay | Admitting: Family Medicine

## 2023-02-13 VITALS — BP 112/78 | HR 86 | Temp 96.6°F | Ht 68.0 in | Wt 217.0 lb

## 2023-02-13 DIAGNOSIS — E669 Obesity, unspecified: Secondary | ICD-10-CM | POA: Diagnosis not present

## 2023-02-13 DIAGNOSIS — Z7985 Long-term (current) use of injectable non-insulin antidiabetic drugs: Secondary | ICD-10-CM

## 2023-02-13 DIAGNOSIS — E1169 Type 2 diabetes mellitus with other specified complication: Secondary | ICD-10-CM

## 2023-02-13 DIAGNOSIS — F332 Major depressive disorder, recurrent severe without psychotic features: Secondary | ICD-10-CM | POA: Diagnosis not present

## 2023-02-13 DIAGNOSIS — R52 Pain, unspecified: Secondary | ICD-10-CM | POA: Diagnosis not present

## 2023-02-13 LAB — POCT GLYCOSYLATED HEMOGLOBIN (HGB A1C): Hemoglobin A1C: 5.5 % (ref 4.0–5.6)

## 2023-02-13 MED ORDER — PREGABALIN 150 MG PO CAPS: 150.0000 mg | ORAL_CAPSULE | Freq: Two times a day (BID) | ORAL | Status: DC

## 2023-02-13 NOTE — Assessment & Plan Note (Signed)
Following with psychiatry.  Mood is very well-controlled today.  She is weaning down on her lithium.  We did discuss that her medications could potentially make it more difficult for her to lose weight however given that her mood is very well-controlled would not recommend making any adjustments at this point..  She is currently on Zyprexa, Viibryd, propranolol, Spravato, Auvelity, and lithium.  She is working with psychiatry to wean down on her lithium.

## 2023-02-13 NOTE — Assessment & Plan Note (Signed)
A1c stable at 5.5. We will continue her current regimen at [facility] 15 mg weekly and syjardy 12.07-998 twice daily. Recheck in 3-6 months.

## 2023-02-13 NOTE — Patient Instructions (Signed)
It was very nice to see you today!  Your A1c looks great today! Keep up the great work!  It is fine for you to decrease your Lyrica to 1 pill twice daily.  Return in about 3 months (around 05/14/2023).   Take care, Dr [name]  PLEASE NOTE:  If you had any lab tests, please let us know if you have not heard back within a few days. You may see your results on mychart before we have a chance to review them but we will give you a call once they are reviewed by [name].   If we ordered any referrals today, please let us know if you have not heard from their office within the next week.   If you had any urgent prescriptions sent in today, please check with the pharmacy within an hour of our visit to make sure the prescription was transmitted appropriately.   Please try these tips to maintain a healthy lifestyle:  Eat at least 3 REAL meals and 1-2 snacks per day.  Aim for no more than 5 hours between eating.  If you eat breakfast, please do so within one hour of getting up.   Each meal should contain half fruits/vegetables, one quarter protein, and one quarter carbs (no bigger than a computer mouse)  Cut down on sweet beverages. This includes juice, soda, and sweet tea.   Drink at least 1 glass of water with each meal and aim for at least 8 glasses per day  Exercise at least 150 minutes every week.

## 2023-02-13 NOTE — Assessment & Plan Note (Signed)
Overall her pain is much better controlled.  She did not start the naltrexone as above.  She would like to go down on her dose of Lyrica.  Given her good control believe this is reasonable.  We will go to 150 mg twice daily.  She can follow-up with [name] in a few weeks via MyChart.  Follow-up again in 3 months.  If doing well at that time we can continue to wean down.

## 2023-02-13 NOTE — Assessment & Plan Note (Signed)
Patient is down about 4 pounds since her last visit however still having some difficulty with this.  She did not start the naltrexone.  We have her on Mounjaro 15 mg weekly.  She is doing well with this.  She is working on diet and exercise.  She would like to hold off on any other medication adjustments at this point.  We did discuss importance of staying active and getting routine cardiovascular exercise.  She is doing a great job with her diet.  We did discuss referral to weight management however she declined.  Will follow-up again in 3 months.

## 2023-02-13 NOTE — Progress Notes (Signed)
Deborah Soto is a 57 y.o. female who presents today for an office visit.  Assessment/Plan:  New/Acute Problems: Hand Nodules No red flags.  Exam consistent with ganglion cyst.  Symptoms are not currently bothersome.  Will continue with watchful waiting for now.  She will let us know if symptoms become more bothersome and we can refer to sports medicine.  Chronic Problems Addressed Today: Type 2 diabetes mellitus with obesity (HCC) A1c stable at 5.5. We will continue her current regimen at [facility] 15 mg weekly and syjardy 12.07-998 twice daily. Recheck in 3-6 months.   Morbid obesity (HCC) Patient is down about 4 pounds since her last visit however still having some difficulty with this.  She did not start the naltrexone.  We have her on Mounjaro 15 mg weekly.  She is doing well with this.  She is working on diet and exercise.  She would like to hold off on any other medication adjustments at this point.  We did discuss importance of staying active and getting routine cardiovascular exercise.  She is doing a great job with her diet.  We did discuss referral to weight management however she declined.  Will follow-up again in 3 months.  Diffuse pain Overall her pain is much better controlled.  She did not start the naltrexone as above.  She would like to go down on her dose of Lyrica.  Given her good control believe this is reasonable.  We will go to 150 mg twice daily.  She can follow-up with [name] in a few weeks via MyChart.  Follow-up again in 3 months.  If doing well at that time we can continue to wean down.  MDD (major depressive disorder), recurrent severe, without psychosis (HCC) Following with psychiatry.  Mood is very well-controlled today.  She is weaning down on her lithium.  We did discuss that her medications could potentially make it more difficult for her to lose weight however given that her mood is very well-controlled would not recommend making any adjustments at this point..   She is currently on Zyprexa, Viibryd, propranolol, Spravato, Auvelity, and lithium.  She is working with psychiatry to wean down on her lithium.     Subjective:  HPI:  See A/P for status of chronic conditions.  Patient is here today for follow-up.  Last seen here about 3 months ago.  At that time A1c stable at 6.2 on Mounjaro 15 mg weekly and Synjardy 12.07-998 twice daily.  At her last visit we also did discuss naltrexone for weight management and fibromyalgia pain. She did not start this. Pain has been well controlled. She is still having trouble losing weight.  She has been diligent with her diet.  She does admit she could be more active.  She has been working with the [employer] which has been beneficial.  She also has noticed a few nodules on bilateral hands over the last several weeks. They seem to be coming and going.  Has current nodule on right finger.  Mildly painful.  Occasional bruising to the area.       Objective:  Physical Exam: BP 112/78   Pulse 86   Temp (!) 96.6 F (35.9 C) (Temporal)   Ht 5\' 8"  (1.727 m)   Wt 217 lb (98.4 kg)   LMP  (LMP Unknown) Comment: last period ---  1 year ago (?)  SpO2 98%   BMI 32.99 kg/m   Wt Readings from Last 3 Encounters:  02/13/23 217 lb (98.4  kg)  12/07/22 216 lb (98 kg)  10/31/22 221 lb 9.6 oz (100.5 kg)    Gen: No acute distress, resting comfortably CV: Regular rate and rhythm with no murmurs appreciated Pulm: Normal work of breathing, clear to auscultation bilaterally with no crackles, wheezes, or rhonchi MUSCULOSKELETAL: right third digit of hand with tender nodule just distal to PIP.  Freely mobile. Neuro: Grossly normal, moves all extremities Psych: Normal affect and thought content      Aashka Salomone M. [name], MD 02/13/2023 9:46 AM

## 2023-02-14 ENCOUNTER — Other Ambulatory Visit: Payer: Self-pay | Admitting: Family Medicine

## 2023-02-14 ENCOUNTER — Other Ambulatory Visit: Payer: Self-pay | Admitting: *Deleted

## 2023-02-14 DIAGNOSIS — M797 Fibromyalgia: Secondary | ICD-10-CM

## 2023-02-14 DIAGNOSIS — F332 Major depressive disorder, recurrent severe without psychotic features: Secondary | ICD-10-CM | POA: Diagnosis not present

## 2023-02-15 ENCOUNTER — Other Ambulatory Visit: Payer: Self-pay | Admitting: Family Medicine

## 2023-02-18 DIAGNOSIS — E119 Type 2 diabetes mellitus without complications: Secondary | ICD-10-CM | POA: Diagnosis not present

## 2023-02-18 LAB — HM DIABETES EYE EXAM

## 2023-02-19 DIAGNOSIS — F332 Major depressive disorder, recurrent severe without psychotic features: Secondary | ICD-10-CM | POA: Diagnosis not present

## 2023-02-21 DIAGNOSIS — F332 Major depressive disorder, recurrent severe without psychotic features: Secondary | ICD-10-CM | POA: Diagnosis not present

## 2023-02-22 DIAGNOSIS — L738 Other specified follicular disorders: Secondary | ICD-10-CM | POA: Diagnosis not present

## 2023-02-22 DIAGNOSIS — L719 Rosacea, unspecified: Secondary | ICD-10-CM | POA: Diagnosis not present

## 2023-02-26 DIAGNOSIS — F332 Major depressive disorder, recurrent severe without psychotic features: Secondary | ICD-10-CM | POA: Diagnosis not present

## 2023-03-01 NOTE — Telephone Encounter (Signed)
Results abstracted and care team updated.

## 2023-03-11 ENCOUNTER — Telehealth: Payer: Self-pay

## 2023-03-11 NOTE — Telephone Encounter (Signed)
Pt returned my call and stated she is unaware of an issue, or th eneed of a PA and receives her medication through mail order in [place].

## 2023-03-14 ENCOUNTER — Other Ambulatory Visit: Payer: Self-pay | Admitting: Psychiatry

## 2023-03-14 DIAGNOSIS — F339 Major depressive disorder, recurrent, unspecified: Secondary | ICD-10-CM

## 2023-03-14 DIAGNOSIS — F332 Major depressive disorder, recurrent severe without psychotic features: Secondary | ICD-10-CM | POA: Diagnosis not present

## 2023-03-16 ENCOUNTER — Other Ambulatory Visit: Payer: Self-pay | Admitting: Family Medicine

## 2023-03-19 DIAGNOSIS — F332 Major depressive disorder, recurrent severe without psychotic features: Secondary | ICD-10-CM | POA: Diagnosis not present

## 2023-03-25 ENCOUNTER — Encounter: Payer: Self-pay | Admitting: Psychiatry

## 2023-03-25 ENCOUNTER — Ambulatory Visit: Payer: BC Managed Care – PPO | Admitting: Psychiatry

## 2023-03-25 DIAGNOSIS — F339 Major depressive disorder, recurrent, unspecified: Secondary | ICD-10-CM | POA: Diagnosis not present

## 2023-03-25 DIAGNOSIS — G251 Drug-induced tremor: Secondary | ICD-10-CM

## 2023-03-25 DIAGNOSIS — F411 Generalized anxiety disorder: Secondary | ICD-10-CM

## 2023-03-25 DIAGNOSIS — G4710 Hypersomnia, unspecified: Secondary | ICD-10-CM

## 2023-03-25 DIAGNOSIS — G4733 Obstructive sleep apnea (adult) (pediatric): Secondary | ICD-10-CM

## 2023-03-25 DIAGNOSIS — Z79899 Other long term (current) drug therapy: Secondary | ICD-10-CM

## 2023-03-25 DIAGNOSIS — R52 Pain, unspecified: Secondary | ICD-10-CM

## 2023-03-25 DIAGNOSIS — R7989 Other specified abnormal findings of blood chemistry: Secondary | ICD-10-CM

## 2023-03-25 MED ORDER — OLANZAPINE 2.5 MG PO TABS: 2.5000 mg | ORAL_TABLET | Freq: Every day | ORAL | 1 refills | Status: DC

## 2023-03-25 MED ORDER — VILAZODONE HCL 40 MG PO TABS: 60.0000 mg | ORAL_TABLET | Freq: Every day | ORAL | 0 refills | Status: DC

## 2023-03-25 MED ORDER — AUVELITY 45-105 MG PO TBCR
1.0000 | EXTENDED_RELEASE_TABLET | Freq: Two times a day (BID) | ORAL | 1 refills | Status: DC
Start: 2023-03-25 — End: 2023-08-01

## 2023-03-25 NOTE — Progress Notes (Signed)
 Deborah Soto [id number] May 01, 1965 58 y.o.     Subjective:   Patient ID:  Deborah Soto is a 58 y.o. (DOB 04/28/1965) female.  Chief Complaint:  Chief Complaint  Patient presents with   Follow-up   Depression   Fatigue   Depression        Associated symptoms include fatigue and myalgias.  Associated symptoms include no decreased concentration.   Deborah Soto presents to the office today for follow-up of TRD and anxiety, and insomnia.  seen December 08, 2018.  She was complaining of lithium  tremor and some cognitive problems.  Hydroxyzine  was stopped.  Propranolol  increased to 60 mg twice daily for tremor.  Lithium  level and amitriptyline  levels were requested. Amitriptyline  level to 37, nortriptyline  49 on amitriptyline  250 mg nightly. Lithium  level 0.800 mg daily  Phone call on October 6 to discuss lab results as follows: Note   ----- Message from [name] Geoffry Mickey., MD sent at 12/15/2018 10:08 AM EDT ----- Lithium  level stable at 0.8 in a good range.  The previous was 0.7.  She is having some tremor issues.   Normal BMP including excellent creatinine and calcium  levels.  Blood sugar is high but she is aware of that problem.   TSH is within normal limits.   Serum amitriptyline  level is 286 which is at the upper end of the normal range and suggestive that we not try further increase.  We will discuss further options at her follow-up appointment.   No medication adjustments required, but because of her tremor and her lithium  level is slightly higher than it was with the prior level if she wants to reduce the lithium  from 3 of the 300 mg tablets daily to 2-1/2 of the 300 mg tablets daily she can do so.  At her last appointment she was also encouraged to try a higher dose of propranolol  and that may have resolved the problem.  If it did do not reduce the lithium  but if it did not and she wants to reduce the dose she can do so as noted.  She should call us  if she has any  recurrence of symptoms.   Lorene Geoffry MD, [name]     She had repeat lithium  level at 750 mg daily of 0.  7 with some improvement in tremor.  In phone call on November 20 she reported mood was worse with weepiness, overreacting, pity party.  Because mood symptoms were worse with the reduction lithium  she was encouraged to increase the dosage back to 900 mg daily.   Last seen February 04, 2019.  The following changes were made: Increase propranolol  to 80 mg twice daily for lithium  tremor and anxiety her blood pressure and pulse appear high enough that she can tolerate this dosage.  Disc Se.  Disc risk with low BP and DM.  He has not been having any problems with low blood sugar. Amitriptyline  6 or 300 mg daily if tolerated.  Try to take some about 4 -5 hours before sleep Try to take lithium  also 4-5 hours before sleep to minimize daytime tremor Add methylphenidate  ER 27 mg in the morning.  If no response we will increase the dosage.  Seen May 08, 2019 with her husband. Aside from physically feeling like she can't do much then having memory issues.  Forgetful.  Loses track of thoughts between tasks.  Most embarrassing being around people and word-finding issues.   Problems with walking bc balance and weakness.  Fears falling. Only fall at  Xmas morning.  Tremor is awful.  Affects keying.   H notices cognitive problems and forgetfulness.    Shaking got really bad and got lithium  level as low as 600 mg but the next day felt really helpless and everything seemed to be aimed at hurting me and felt disrespected.  Problems with boss and 58 yo taking 5 AP classes and 2 volleyball classes.  I've been on her to accomplish all these things.  She claimed that pt screaming at her.   H CO she was more irritable after the reduction in lithium .   H CO her anxiety also seems worse.  H says the last year has been unhealthy with 12 hour work days for her.   Doesn't go out much.    Attention problems more  noticeable.  Still dropping and tremor.  Tolerated the increase in propranolol .    CO shakiness from lithium  which interferes with typing. Also a good bit of anxiety generally without panic.  BS are high.  NotTaking hydroxyzine .   sleeping better and still using CPAP.  Using CPAP regularly. Less awakening than before.  No SE. depression was worse after reducing the lithium .  Irritability was also worse after reducing the lithium  to 600 mg daily.  seen May 08, 2019.  Multiple changes made:  Reduce propranolol  to 60 mg twice daily for lithium  tremor and anxiety since the increase was not helpful.   Restart B6 for lithium  tremor. 500 mg BID. She forgot and doesn't want more. Try to take lithium  also 4-5 hours before sleep to minimize daytime tremor. depression was worse after reducing the lithium .  Irritability was also worse after reducing the lithium  to 600 mg daily. So reluctant to reduce it further.  It is unclear how to address the benefits of lithium  without using lithium  other than to consider Spravato  Increase methylphenidate  ER 36 mg in the morning. Increase methylphenidate  ER 36 mg in the morning.  Wait 1-2 weeks then reduce amitriptyline  to 5 tablets nightly to see if cognition is better. Also saw Dr. [name] re: sleep disorder.  05/2019 appt with the following noted: Lost Rx of Concerta  36 so didn't take it long.  Out of it.  Still shakey and cognitive problems.  Dep 5/10/  Anxiety 6/10 worse at night.  No SI.  8 hours sleep.  Mild panic couple times daily. Frustrated with tremor and sleepiness which is worse than tiredness.   History of low vitamin D  taking 150K/week but stopped 6 mos ago. Plan : Modafinil  100 mg tablet 1 dailly for 1 week, Then add Concerta  36 mg 1 each AM.  Option increase modafinil  mid April  Restart B6 for lithium  tremor. 500 mg BID. Get lab test at earliest convience  10/14/2019 appointment with the following noted: Has been on amitriptyline  250 mg for  mos.  Tried a week ago reducing to 200 and felt worse so back on 250. Weaned off Lyrica  about a month ago.  Not much change off it.   Not sure if it helped energy or alertness. Saw Dr. Dyan in consultation. Mental clarity is a lot better with stimulants and not sleeping as much in daytime with current meds.  Severe drowsiness is a lot better.   Off metformin  and added Glipizide.   Not great with depression 5/10.  Anxiety can trigger problems with thinking and somatic sx   I feel like it's something physical and can keep her up at night. Occ racing heart or feeling hot.  Caffeine variable.  Plan:Plan: Increase methylphenidate  from 36 to 54 mg in the morning. Increase amitriptyline  to 6 tablets in the evening    11/12/19 TC noting call: Pt called stated she's not feeling well at all. Ask for sooner apt than 10/7. Depression, anxiety & focus is much worse. Has not planned to hurt herself but thoughts of things would be better if I was not here have crossed her mind often. Contact ASAP @ [phone].  I put Pt on canc list  MD response: I reviewed the meds and previous med lists.  The next options are either Spravato or a major med change from amitriptyline  which is not something I can do outside of an appt.  She's on cancellation list and we'll try to work her in sooner.  Nothing I can change right now except since the increase in amitriptyline  has not helped, she can reduce it to 200 mg nightly in preparation for other changes to come. Nursing response after talking with pt: Discussed symptoms and medications with patient, she did not increase her Amitriptyline  50 mg up to 6 tablets so she will start that today. Said you may have told her that but she didn't remember. She has read about Spravato but she is not wanting to proceed with that. Informed her she's on cancellation list as well. She will call back with worsening signs or symptoms.  11/23/19 appt with the following noted: Forgot to increase  amitriptyline  until noted. Tolerating it OK but hasn't had time to help. More alert with Concerta  increase to 54 mg daily also with modafinil .  A little more focused and better productivity.  Still forgetful.   No SE noted except GI issues from diarrhea to constipation.   Still miserable and cry easily.  Try to numb herself.  Always tired especially emotionally.  Anxiety is high and predates current stimulants. No therapy since [facility]  She felt he pushed too hard for her to make changes.  I don't feel like I can deal with him right now. Plan: Continue meds & The increase in amitriptyline  to 300 mg daily needs more time.  01/12/2020 appointment with the following noted: Not well.  Mood horrible.  Easily frustrated sometimes without reason and may nap or cry.  Anxiety is pretty bad.  Daytime sleepiness seems worse.  Can get nausea from anxiety.  Feels like function is slow.  Have to write everything down.  Hates home, job, high school volleyball program.  At some point I have to take responsibility.   Amitriptyline  also not helping pain any.  Clenching jaw for 3 weeks. Likcking lips.  Picking fingers. D will play volleyball at [employer]. Plan: Reduce amitriptyline  by 1 tablet every 4 days. Start Viibryd  10 mg daily with food for 7 days, then 20 mg daily for 7 days, then increase to 40 mg daily.  03/18/20 appt urgently made and noted:  Seen with husband. Still with jaw clenching day and night despite off stimulant. Now on Viibryd  60 mg since 03/02/20 and no stimulant., lithium  900 mg, and propranolol  80 BID. Rare hydroxyzine  DT sleepiness, Gabapentin  300 TID. Not taking it with food.  LT sensitivity to noise and light and overstimulated and now bothering her more at work.  Always had to use shirts without tags and sensitivity to smells. Easily agitated in public per H.  Gets negative and wants to give up and not live anymore.  She says there's less chance of acting on it if she tells  H about it and she's doing so now.  Pain worse off amitriptyline  esp around ribs and general diffuse pain.  Anxiety worse with stimulants and hasn't gone away.  Trouble staying asleep. Plan: Need to take Viibryd  60 with food emphasized.  She's not currently doing so. Restart low-dose amitriptyline  for pain  04/22/2020 appointment with following noted: Only restarted amitriptyline  50 mg last week.  Stays asleep better with it.  Sleep 8-9 hours now but no change in pain yet.  Sleepier in day recently. Done a lot of travel lately. Maybe viibryd  making a little difference with mood and anxiety now that has been on it longer.  Needs to take Viibryd  in AM bc seems to interfere with sleep. Final season of travel volleyball with good team. H notes still some irritability. Pain worse off amitriptyline .  Can reduce to  amitriptyline  25 for pain if 50 mg remains too sedating.  Is sleeping better.      06/21/2020 appointment with following noted:  Seen with H Tolerating amitriptyline  50 mg HS with decent sleep. Really crappy and as bad as 10 years.  Situationally life sucks.  Work environment is bad with coworkers.  Since Sept work is bad.  Doesn't feel people are supportive at home or work. H working 12 hour days.  When she's off wants to sleep all day.   Still in therapy with [facility].  Is good therapist. 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  Check lithium  level Referred to Dr. Vincente for Spravato  11/03/2020 appointment with the following noted: 58 yo D got Covid but is OK. Still doing Spravato 84 mg twice weekly and plans to continue for awhile. And for a total of 3 mos.  Tolerated well.  It's a nice feeling.   Didn't seem to help the depression for a couple of months.   It does feel different. SI has resolved lately which is particularly impressive given she  has a lot of free time. She has lot s of time with nothing to do bc quit her job. Job environment got worse and worse.  Had been there 9 years.   Doing PT for pain in her back.  Ongoing physical issues.   Mood clearly better with Spravato but not resolved.   Neck fusion recently. Plan: check lithium  level Reduce olanzapine  to 5 mg HS  12/30/2020 appointment with the following noted: Surges of anxious feelings in her chest without heart racing several times daily last 15 mins and without pcpt.  Stay a little on edge all the time.   Left job and too much time on her hands. Youngest at [employer]  and [employer] in new position.  She's doing well statistically.  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 Spravato twice weekly.  Is better with it but still some mild cycling of depression.  Still upset over leaving work.  SI very rare and fleeting.  Had a really good plan but doesn't dwell on the plan. Lithium  level 0.7 on 900 mg HS. Reduced olanzapine  to 5 mg HS and has felt a little worse. Viibryd  60 mg daily. Amitrptyline 50 for pain.  Not sure if it helps.  PT helped a lot with tension in neck and down spine. Sleeping pretty well after 8 hours. Plan increase olanzapine  back to 10 mg nightly for treatment resistant depression and anxiety.  Anxiety was worse after  reducing it and no symptoms were improved after reducing it. Try to stop amitriptyline  because it is probably not needed in the presence of olanzapine . Check lithium  level  03/14/2021 appointment noted: Increase olanzapine  to 10 mg for anxiety spells that are unexpected and hit her in the chest esp in the morning. Very low right now.  Twice weekly Spravato and felt good for awhile.  Maybe a slump after Xmas.  Not much social interaction since she quit her job.  Xmas gave her focus.  Doesn't have this now.   Mark working 14-16 hours daily.  This won't get better. Kids are doing great but not around Sleep well generally and that is a benefit of  olanzapine . Tolerates it well.   Plan: Trial increase olanzapine  to 15 mg HS  05/03/2021 appointment with the following noted: Still getting Spravato twice weekly.   I love it.  Totally relaxed Depression and anxiety SI is gone or fleeting with Spravato. Still in therapy with [facility] and thinks a lot of stuff go back to childhood.  Was always criticized for normal childhood mistakes.  So coped by overcompensating with going big and grand. Can't get energy up to do anything. Never felt she had a manic periods except overstimulated CC anxious feeling in body tingling and tight uncomfortable feeling.  Reminds her of stimulant effects but less intense and worse in AM. Everything still seems to hard like going to grocery store and worry over messing up cooking. Watches too much TV For a little while increase olanzapine  seemed to help.  Tolerating it. Depression is not that bad compared to the past. Plan: no med changes  07/17/21 appt noted:  seen with H Depression with twice weekly Spravato. But a lot of general anxiety and then spells of getting tingly and nervous.  Not triggers.   Using propranolol  BID. Anxiety is continuation of the way it was before.  Doesn't seem to vary with time of day.  Awakens with anxiety. No SE noted except sleepy.  Sleeping at least 10-11 hours.  Not obviously worse with increase olanzapine .  Viibryd  in AM bc insomnia otherwise. Balance trouble with hunched walk and she feels like legs are waker than they should be. Plan: Stop regular propranolol  and start ER propranolol  ER 1 at night for anxiety Reduce clonidine  to 1/2 tablet twice daily Wait 1 week and reduce olanzapine  to 1/2 tablet nightly to see if energy, alertness and balance are better. Starting June 1 stop clonidine .  09/05/21 appt noted: Still low energy.  No changes with less meds. Anxiety a little better last week for unclear reasons. She feels olanzapine  helps sleep and doesn't want to stop it.   Sleep a lot.  11-11 if she can. Therapist working with her on childhood trauma. Long period of depression. Plan: Start Auvelity  1 daily for a week then 1 twice daily  11/27/21 appt noted:  Couple of phone calls and tremor problems and needed to reduce lithium  to 1 and 1/2 tablets and it helped reduce the tremor. Maybe a little improvement.  Not so depressed but kind of numb.   Spells of anxiety but a little better.  Can feel it in chest and arms at times every other night.   Continues spravato twice weekly.   Sleep is great. Off clonidne.  Contiues Aubelity twice daily, lithium  675 mg daily, olanzapine  5 mg HS, viibryd  60 mg daily. Sleep from 11-9 and sleeps well. Plan: Trial reduction in olanzapine  to 2.5 mg nightly.  02/14/22 appt noted: Noticed FM got better in summer and then relapsed.  Balance worth with FM also.   Did reduce Spravato from twice weekly to weekly in October.  Did it twice weekly for a year now.  Thinks maybe that is why FM flared.  Continues to weekly. No SI in over a years.  Better dresssed and using makeup now.  Wasn't for about 5 years.. Getting closer to feeling normal .  Depression is still good. Reduced and increased gabapentin  to 900 BID. No other med changes except baclofen  prn which is marginally helpful. Feels better and got motivated to buy a car.  She's more active and less depressed than she has been.  Gets showered and dress daily before leaving [place]. Less numb emotionally than when last here.  Some excitement. Asks about coming off the Auvelity  to reduce meds. A1C is 5.   Sleep is better and using CPAP Plan: no med change.  Continue Auvelity  1 twice daily, continue lithium  CR 450 mg tablets 2 nightly, continue olanzapine  2.5 mg nightly, continue propranolol  ER 160 mg daily, continue Viibryd  60 mg daily.  05/16/22 appt noted: Meds as above.  Note she was prescribed Mounjaro . switched from gabapentin  to Lyrica  150 mg twice daily,  Missed Auvelity  7 days  and felt better when restarted it and I think it helps. Spravato twice weekly early Jan hoping to help pain but didn't. Still dealing with chronic pain esp around abd but also general aches.  Lyrica  has helped some No SE Doing well other than pain.  Had a dip in mood late DEC into early Feb without SI but not interested but better now. Interest in cookie decorating and to make for Maggie's wedding in next year. Plan no changes  08/01/22 appt noted: Psych med: Auvelity  BID, lithium  CR 675 mg nightly, olanzapine  2.5 mg nightly, propranolol  ER 160 mg daily, Viibryd  60 mg daily, vitamin D  50000 units weekly.  Also on pregabalin  150 mg every morning and 300 mg nightly for fibromyalgia pain.  Spravato twice weekly. Doing pretty well and better than usual.   Still in therapy with [facility].  Dealt with feelings worthlessness out of childhood.  Parents were very critical.  Has let a lot of this go. This is best I've felt in a long time.   Tolerating meds. New hobby is baking.   Going to see M in [place] in July which could be triggering.  Sister is there too and much like her. Sister can be critical too.  Her kids are going too.   Had a nice mother's Day affirming.  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   Sleep is good. Plan no changes  10/03/22 appt noted: Psych med: Auvelity  BID, lithium  CR 675 mg nightly, olanzapine  2.5 mg nightly, propranolol  ER 160 mg daily, Viibryd  60 mg daily, vitamin D  50000 units weekly.  Also on pregabalin  150 mg every morning and 300 mg nightly for fibromyalgia pain.  Spravato twice weekly. Hesitant to try cutting back to once weekly Spravato bc upcoming family vacation. Doing therapy .  Father is passed.  M can be dismissive and critical.  Wrote a letter letting go of the past.   M on O2.  Family in [place].   Better energy.  Big family gathering this next week.   Dealing with DM unstable craving.   Plan no med changes  01/03/23 appt noted; No  med changes, continue: Auvelity  BID, lithium  CR 675 mg  nightly, olanzapine  2.5 mg nightly, propranolol  ER 160 mg daily, Viibryd  60 mg daily, vitamin D  50000 units weekly.  Also on pregabalin  150 mg every morning and 300 mg nightly for fibromyalgia pain.  Spravato twice weekly. Would like to cut Spravato to once weekly bc has things to do.  Involved in campaigns.  Passionate about it.   Mood is much better.  Doing really well overall and seeing [facility] every other week.   Made big progress and feels like a blanket taken off.  Write a lot of therapeutic letters.  Gotten very busy since then.  Will start baking again and [employer] business.  Enjoying the research.   Involved in early voting outreach.  D's teachers. Couple of times forgetting night time meds.  Poor sleep if forgets night meds.   Having fun in life.    03/25/23 appt noted: No med changes, continue: Auvelity  BID, lithium  CR 675 mg nightly, olanzapine  2.5 mg nightly, propranolol  ER 160 mg daily, Viibryd  60 mg daily, vitamin D  50000 units weekly.  Also on pregabalin  150 mg every morning and 300 mg nightly for fibromyalgia pain.  Spravato twice weekly. Still getting Spravato went to once weekly. Been ok.  Some of the excitmemt has leveled out from last viist. DT holdidays was spread out more than usual.  Don't like the holidays.  Could have been holidays.   Seeing therapist [facility] every other week.  Not really down now.  Even on low days nothing like before.   Wants to wean off lithium  bc no longer suicidal.   Has lots of ideas but would like more energy.     Depression started after children born.     ECT-MADRS    Flowsheet Row Office Visit from 05/03/2021 in [facility]  MADRS Total Score 32      GAD-7    Flowsheet Row Office Visit from 02/13/2023 in [facility]  Total GAD-7 Score 7      PHQ2-9    Flowsheet Row Office Visit from 02/13/2023 in [facility] [place] Office Visit from 10/31/2022 in [facility] [employer] Visit from 08/14/2022 in [facility] [employer] from 03/22/2022 in [facility] [employer] from 02/09/2022 in [facility]  PHQ-2 Total Score 1 0 0 2 0  PHQ-9 Total Score 3 0 0 11 0      Flowsheet Row Admission (Discharged) from 08/17/2020 in [facility]  [facility] Pre-Admission Testing 62 from 08/16/2020 in [facility] PREADMISSION TESTING  C-SSRS RISK CATEGORY No Risk No Risk       Maggie 50 and dating.  Past Psychiatric Medication Trials:  Failed ECT and TMS,  Spravato started 07/2020 amitriptyline  helped for 2 to 3 years in 1987,   2021 No benefit from amitriptyline  300 mg with adequate duration.  Nortriptyline  200 with a therapeutic blood level. Trintellix, duloxetine  120, Lexapro  20, Fetzima,  Wellbutrin, fluoxetine, paroxetine, sertraline,  (Poor response to SSRIs) Viibryd  60 [place],   lithium  SE,  Latuda 120, Abilify 15,  Vraylar, Rexulti ,Seroquel 400, risperidone, Geodon,  Haldol for agitation, lamotrigine 300,  buspirone , clonazepam, gabapentin , Propranolol , clonidine  0.1 mg BID modafinil ,  Nuvigil , Vyvanse, Concerta  Pramipexole SE compulsive  SHE DOESN'T WANT STIMULANTS AGAIN bc of anxiety. Trazodone ,   GENESIGHT COMPLETED  history of overdose on Xanax, There is a history of suicide attempts and psychiatric hospitalizations.  New PCP good [facility]   Review of Systems:  Review of Systems  Constitutional:  Positive for fatigue. unchanged Cardiovascular:  Negative for chest pain and palpitations.  Gastrointestinal:  Negative for diarrhea.  Musculoskeletal:  Positive for back pain and myalgias. Negative for gait problem.       Less pain lately.  Neurological:  Positive for mild tremors .  Negative  for dizziness.  Psychiatric/Behavioral:  Positive for dysphoric mood. Negative for confusion, decreased concentration, hallucinations and self-injury. The patient is nervous/anxious.     Medications: I have reviewed the patient's current medications.  Current Outpatient Medications  Medication Sig Dispense Refill   celecoxib  (CELEBREX ) 200 MG capsule TAKE ONE CAPSULE TWICE DAILY FOR chronic pain 180 capsule 0   Continuous Blood Gluc Receiver (DEXCOM G7 RECEIVER) DEVI Use 4 times daily to check blood sugar 1 each 11   Continuous Glucose Sensor (DEXCOM G7 SENSOR) MISC USE AS DIRECTED FOUR TIMES DAILY TO CHECK BLOOD SUGAR 3 each 0   Empagliflozin -metFORMIN  HCl ER (SYNJARDY  XR) 25-1000 MG TB24 Take 1 tablet by mouth daily.     Esketamine HCl, 84 MG Dose, (SPRAVATO, 84 MG DOSE,) 28 MG/DEVICE SOPK Place 84 mg into the nose See admin instructions. Use weekly     Ginger, Zingiber officinalis, (GINGER PO) Take by mouth.     Insulin  Pen Needle (PEN NEEDLES) 33G X 4 MM MISC Use daily as needed to inject insulin  100 each 1   ipratropium (ATROVENT ) 0.03 % nasal spray SMARTSIG:2 Spray(s) Both Nares 3 Times Daily PRN     lidocaine  (LIDODERM ) 5 % Place 1 patch onto the skin every 12 (twelve) hours. Remove & Discard patch within 12 hours or as directed by MD 30 patch 2   lithium  carbonate (ESKALITH ) 450 MG ER tablet Take 1.5 tablets (675 mg total) by mouth at bedtime. 135 tablet 1   MOUNJARO  15 MG/0.5ML Pen INJECT 0.5MLS SUBCUTANEOUSLY ONCE A WEEK 6 mL 11   pantoprazole  (PROTONIX ) 40 MG tablet TAKE ONE TABLET BY MOUTH ONCE DAILY FOR STOMACH PROTECTION 30 tablet 5   pregabalin  (LYRICA ) 150 MG capsule Take 1 capsule (150 mg total) by mouth 2 (two) times daily.     propranolol  ER (INDERAL  LA) 160 MG SR capsule Take 1 capsule (160 mg total) by mouth daily. 90 capsule 0   SYNJARDY  XR 12.07-998 MG TB24 TAKE TWO TABLETS BY MOUTH DAILY 60 tablet 5   tretinoin (RETIN-A) 0.05 % cream SMARTSIG:Sparingly Topical Every  Night     tretinoin (RETIN-A) 0.1 % cream nightly.     trimethoprim -polymyxin b  (POLYTRIM ) ophthalmic solution Place 2 drops into both eyes every 6 (six) hours. 10 mL 0   TURMERIC PO Take by mouth.     Vitamin D , Ergocalciferol , 50000 units CAPS TAKE ONE CAPSULE BY MOUTH EVERY 7 DAYS 15 capsule 0   XARELTO  20 MG TABS tablet TAKE ONE TABLET BY MOUTH DAILY WITH SUPPER 30 tablet 5   Dextromethorphan-buPROPion ER (AUVELITY ) 45-105 MG TBCR Take 1 tablet by mouth 2 (two) times daily. 180 tablet 1   OLANZapine  (ZYPREXA ) 2.5 MG tablet Take 1 tablet (2.5 mg total) by mouth at bedtime. 90 tablet 1   Vilazodone  HCl (VIIBRYD ) 40 MG TABS Take 1.5 tablets (60 mg total) by mouth daily. 135 tablet 0   No current facility-administered medications for this visit.    Medication Side Effects: Sedation  Allergies:  Allergies  Allergen Reactions   Tramadol  Other (See Comments)    Tingling all over    Hydrocodone      Ineffective     Past Medical History:  Diagnosis Date   Anemia    Anxiety    Asthma    Depression    Diabetes mellitus type 2 in obese 06/11/2016   Dyspnea    Embolism - blood clot January 1997 & January 2017   Also had LLE DVT in 1997   History of kidney stones    Hypertension    Preeclampsia 10/10/2011   1999    Sleep apnea     Family History  Problem Relation Age of Onset   Dementia Father    Heart disease Father    Clotting disorder Mother    Arthritis Mother    Clotting disorder Sister    Clotting disorder Maternal Grandmother    Clotting disorder Maternal Aunt    Rheumatologic disease Neg Hx    Hyperparathyroidism Neg Hx     Social History   Socioeconomic History   Marital status: Married    Spouse name: Not on file   Number of children: Y   Years of education: Not on file   Highest education level: Not on file  Occupational History   Occupation: admin assist    Comment: insurance verification  Tobacco Use   Smoking status: Former    Current packs/day:  0.00    Average packs/day: 1 pack/day for 20.0 years (20.0 ttl pk-yrs)    Types: Cigarettes    Start date: 03/12/1984    Quit date: 03/12/2004    Years since quitting: 19.0    Passive exposure: Past   Smokeless tobacco: Never  Vaping Use   Vaping status: Never Used  Substance and Sexual Activity   Alcohol use: Yes    Alcohol/week: 1.0 standard drink of alcohol    Types: 1 Cans of beer per week    Comment: 1-2 times a week   Drug use: No   Sexual activity: Yes    Partners: Male    Birth control/protection: None  Other Topics Concern   Not on file  Social History Narrative   Originally from [place]. Always lived in [place] . 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 No international travel recently. Previously has been to [place] in May 1996. No mold exposure recently. No bird exposure. Does have multiple pets.    Social Drivers of Corporate Investment Banker Strain: Not on file  Food Insecurity: Not on file  Transportation Needs: Not on file  Physical Activity: Not on file  Stress: Not on file  Social Connections: Unknown (07/24/2021)   [facility]   Social Network    Social Network: Not on file  Intimate Partner Violence: Unknown (06/15/2021)   [facility], [facility]   HITS    Physically Hurt: Not on file    Insult or Talk Down To: Not on file    Threaten Physical Harm: Not on file    Scream or Curse: Not on file    Past Medical History, Surgical history, Social history, and Family history were reviewed and updated as appropriate.   Please see review of systems for further details on the patient's review from today.   Objective:   Physical Exam:  LMP  (LMP Unknown) Comment: last period ---  1 year ago (?)  Physical Exam Constitutional:      General: She is not in  acute distress. Musculoskeletal:        General: No deformity.  Neurological:     Mental Status: She is alert and oriented to person, place, and time.      Cranial Nerves: No dysarthria.     Coordination: Coordination normal.  Psychiatric:        Attention and Perception: Attention and perception normal. She does not perceive auditory or visual hallucinations.        Mood and Affect: Mood is euthymic. Affect is not labile, blunt, angry, tearful or inappropriate.    Not as severe as in the past. Not irritable.    Speech: Speech normal.        Behavior: Behavior normal. Behavior is cooperative.        Thought Content: Thought content normal. Thought content is not paranoid or delusional. Thought content does not include homicidal or suicidal ideation. Thought content does not include suicidal plan.        Cognition and Memory: Cognition and memory normal.        Judgment: Judgment normal.     Comments: Insight intact    Lab Review:     Component Value Date/Time   NA 141 04/11/2022 1054   NA 138 08/21/2019 0000   NA 140 12/11/2016 1236   K 4.5 04/11/2022 1054   K 4.2 12/11/2016 1236   CL 107 04/11/2022 1054   CO2 24 04/11/2022 1054   CO2 24 12/11/2016 1236   GLUCOSE 126 (H) 04/11/2022 1054   GLUCOSE 109 12/11/2016 1236   BUN 14 04/11/2022 1054   BUN 12 08/21/2019 0000   BUN 15.1 12/11/2016 1236   CREATININE 0.70 04/11/2022 1054   CREATININE 0.68 02/22/2020 1649   CREATININE 0.8 12/11/2016 1236   CALCIUM  9.9 04/11/2022 1054   CALCIUM  10.0 12/11/2016 1236   PROT 7.0 04/11/2022 1054   PROT 7.5 12/11/2016 1236   ALBUMIN 4.9 04/11/2022 1054   ALBUMIN 4.4 12/11/2016 1236   AST 30 04/11/2022 1054   AST 38 (H) 12/11/2016 1236   ALT 36 (H) 04/11/2022 1054   ALT 61 (H) 12/11/2016 1236   ALKPHOS 55 04/11/2022 1054   ALKPHOS 71 12/11/2016 1236   BILITOT 0.5 04/11/2022 1054   BILITOT 0.62 12/11/2016 1236   GFRNONAA >60 08/16/2020 1157   GFRNONAA 105 12/11/2018 1121   GFRAA 122 12/11/2018 1121       Component Value Date/Time   WBC 8.6 04/11/2022 1054   RBC 5.01 04/11/2022 1054   HGB 15.0 04/11/2022 1054   HGB 14.5 12/11/2016 1236    HCT 45.2 04/11/2022 1054   HCT 43.9 12/11/2016 1236   PLT 280.0 04/11/2022 1054   PLT 289 12/11/2016 1236   PLT 295 07/24/2016 1848   MCV 90.1 04/11/2022 1054   MCV 93.2 12/11/2016 1236   MCH 29.9 08/16/2020 1157   MCHC 33.1 04/11/2022 1054   RDW 14.5 04/11/2022 1054   RDW 13.5 12/11/2016 1236   LYMPHSABS 2.2 11/22/2020 1223   LYMPHSABS 2.4 12/11/2016 1236   MONOABS 0.6 11/22/2020 1223   MONOABS 0.7 12/11/2016 1236   EOSABS 0.1 11/22/2020 1223   EOSABS 0.7 (H) 12/11/2016 1236   BASOSABS 0.0 11/22/2020 1223   BASOSABS 0.0 12/11/2016 1236    Lithium  Lvl  Date Value Ref Range Status  11/22/2020 0.7 0.6 - 1.2 mmol/L Final   11/22/20  lithium  0.7 on 900   Serum nortriptyline  level on 150 mg a day was 109  Amitriptyline   Level 12/11/2018 =237  On  250 mg daily,.  12/23/2020 vitamin D  level 31.38, vitamin B12 519  No results found for: PHENYTOIN, PHENOBARB, VALPROATE, CBMZ   .res Assessment: Plan:    Recurrent major depression resistant to treatment (HCC) - Plan: Dextromethorphan-buPROPion ER (AUVELITY ) 45-105 MG TBCR, OLANZapine  (ZYPREXA ) 2.5 MG tablet, Vilazodone  HCl (VIIBRYD ) 40 MG TABS  Generalized anxiety disorder - Plan: OLANZapine  (ZYPREXA ) 2.5 MG tablet, Vilazodone  HCl (VIIBRYD ) 40 MG TABS  Hypersomnolence  Obstructive sleep apnea  Diffuse pain  Lithium -induced tremor  Low vitamin D  level  Lithium  use   Keyshawna has chronic severe major depression and generalized anxiety that is treatment resistant and failed multiple medications ECT and TMS as noted above including SSRIs, try cyclic's, lithium , and atypical antipsychotics for depression.. She has had partial benefit from amitriptyline  plus lithium .  However  tremor problems from the lithium .  Efforts to reduce the lithium  have led to worsening psychiatric symptoms.  There has been a discussion about possible bipolar elements but she has no clear manic episodes unrelated to medication changes.  She has  features of atypical depression.  So an MAO inhibitor is attractive from that perspective. Not sig depressed and numbness better.  Good response.  Doing well then.    Discussed Spravato option in detail.  She is continuing Spravato.  Done well with reduced olanzapine  to 2.5 mg HS for TRD For anxiety and depression.  Consider switch and retry pramipexole and push dose. Less numb  Statistically speaking the most potentially beneficial option would be Parnate or Nardil but they are difficult to use   Need to take Viibryd  60 with food emphasized.   check lithium   Consider wean after holidays.  Yes.  Reduce to 450 mg daily for 1 month, then reduce to 1/2 daiy for a month then stop it.  Watch for any increase SI or impulsivity.   When tremor resolves then start taper of propranolol .  continue Auvelity  1 twice daily BC partial benefit Disc SE.   Her improvement seems to correlate with starting this.  continu counseling.  Good benefit seeing Medford Conner.. Counseling 20 min: Great progress dealing with childhood issues.  Helped current perspective.  Read letter in office written to parents, letting go and forgiveness themes. Encourage physical activity and weight loss.  She is not particularly motivated and feels that is hard to accomplish because of chronic pain.  Chronic pain complicates her treatment of depression.   Lyrica  helps pain.  Recent B12 deficiency dx and treatment started. vitamin D  bc level 31 on 2000U daily  Hold B6 for lithium  tremor. 500 mg BID. If needed.  Tremor is better.  None currently.  Try to take lithium  also 4-5 hours before sleep to minimize daytime tremor. depression was worse after reducing the lithium .  Irritability was also worse after reducing the lithium  to 600 mg daily.   Disc weith loss meds Monjarou recently started for DM and goals.  Last checked lost 17#.  BS is OK.  Has continuous glucose monitor. More benefit with Monjarou than semaglutide .  Take  vitamin D  regularly.  No med changes,  continue: Auvelity  BID,  Start reduction lithium  CR 675 mg nightly to 450 mg nightly,  Later will start taper propranolol  olanzapine  2.5 mg nightly, propranolol  ER 160 mg daily, Viibryd  60 mg daily, vitamin D  50000 units weekly.  Also on pregabalin  150 mg every morning and 300 mg nightly for fibromyalgia pain.  Spravato twice weekly.  Follow-up 8 weeks  Lorene Macintosh MD, [name]  Future Appointments  Date Time Provider Department Center  05/15/2023  9:00 AM Kennyth Worth HERO, MD [facility]     No orders of the defined types were placed in this encounter.      -------------------------------

## 2023-03-26 DIAGNOSIS — F332 Major depressive disorder, recurrent severe without psychotic features: Secondary | ICD-10-CM | POA: Diagnosis not present

## 2023-03-31 ENCOUNTER — Telehealth: Payer: Self-pay

## 2023-03-31 NOTE — Telephone Encounter (Signed)
Prior Authorization  Vilazodone 40 mg #135/90 [employer]  Approved Effective:  03/28/23-03/27/24

## 2023-04-02 DIAGNOSIS — F332 Major depressive disorder, recurrent severe without psychotic features: Secondary | ICD-10-CM | POA: Diagnosis not present

## 2023-04-02 DIAGNOSIS — H16002 Unspecified corneal ulcer, left eye: Secondary | ICD-10-CM | POA: Diagnosis not present

## 2023-04-09 DIAGNOSIS — F332 Major depressive disorder, recurrent severe without psychotic features: Secondary | ICD-10-CM | POA: Diagnosis not present

## 2023-04-10 ENCOUNTER — Other Ambulatory Visit: Payer: Self-pay

## 2023-04-10 ENCOUNTER — Encounter: Payer: Self-pay | Admitting: Family Medicine

## 2023-04-10 ENCOUNTER — Ambulatory Visit: Payer: BC Managed Care – PPO | Admitting: Family Medicine

## 2023-04-10 VITALS — BP 120/78 | HR 72 | Ht 68.0 in | Wt 215.0 lb

## 2023-04-10 DIAGNOSIS — M25551 Pain in right hip: Secondary | ICD-10-CM | POA: Diagnosis not present

## 2023-04-10 NOTE — Patient Instructions (Signed)
Thank you for coming in today.   You received an injection today. Seek immediate medical attention if the joint becomes red, extremely painful, or is oozing fluid.

## 2023-04-10 NOTE — Progress Notes (Signed)
I, Stevenson Clinch, CMA acting as a scribe for [name], MD.  Deborah Soto is a 58 y.o. female who presents to [facility] today for re-occurring R hip pain. Pt was last seen by Dr. Denyse Amass on 12/07/22 and was given a R GT steroid injection.  Today, pt reports 1 month. Now having pain in left hip. Notes that legs feel weak like they may buckle. Attempted HEP provided by PT but this exacerbates symptoms. Denies mechanical sx in the hip. Notes aching in the hip with prolonged sitting. Shooting pain into the leg when first standing. Daughter getting married on April 11th, would like to plan for injection around that. Taking Celebrex and Lyrica.   Dx imaging: 07/19/22 R hip XR             05/04/20 L-spine MRI             03/14/20 L-spine XR  Pertinent review of systems: No fevers or chills  Relevant historical information: History of pulmonary embolism.  Diabetes.   Exam:  BP 120/78   Pulse 72   Ht 5\' 8"  (1.727 m)   Wt 215 lb (97.5 kg)   LMP  (LMP Unknown) Comment: last period ---  1 year ago (?)  SpO2 98%   BMI 32.69 kg/m  General: Well Developed, well nourished, and in no acute distress.   MSK: Right hip normal-appearing tender palpation greater trochanter.  Normal hip motion.  Strength reduced hip abduction.    Lab and Radiology Results  Procedure: Real-time Ultrasound Guided Injection of the right lateral hip greater trochanter bursa Device: Philips Affiniti 50G/GE Logiq Images permanently stored and available for review in PACS Verbal informed consent obtained.  Discussed risks and benefits of procedure. Warned about infection, bleeding, hyperglycemia damage to structures among others. Patient expresses understanding and agreement Time-out conducted.   Noted no overlying erythema, induration, or other signs of local infection.   Skin prepped in a sterile fashion.   Local anesthesia: Topical Ethyl chloride.   With sterile technique and under  real time ultrasound guidance: 40 mg of Kenalog and 2 mL of Marcaine injected into her trochanter bursa. Fluid seen entering the bursa.   Completed without difficulty   Pain immediately resolved suggesting accurate placement of the medication.   Advised to call if fevers/chills, erythema, induration, drainage, or persistent bleeding.   Images permanently stored and available for review in the ultrasound unit.  Impression: Technically successful ultrasound guided injection.        Assessment and Plan: 58 y.o. female with right lateral hip pain due to trochanteric bursitis.  Plan for injection today.  Continue home exercise program previously taught by PT.  If not improving consider MRI.  Her daughter is getting married on April 11.  Recommend schedule with me about a week prior for potential injection if she is hurting.  This would be a bit earlier than normal but a good reason to do injections earlier.   PDMP not reviewed this encounter. Orders Placed This Encounter  Procedures   [name] LIMITED JOINT SPACE STRUCTURES LOW RIGHT(NO LINKED CHARGES)    Reason for Exam (SYMPTOM  OR DIAGNOSIS REQUIRED):   right hip pain    Preferred imaging location?:   [facility]   No orders of the defined types were placed in this encounter.    Discussed warning signs or symptoms. Please see discharge instructions. Patient expresses understanding.   The above documentation has been reviewed  and is accurate and complete [name], M.D.

## 2023-04-12 ENCOUNTER — Encounter: Payer: Self-pay | Admitting: Psychiatry

## 2023-04-13 ENCOUNTER — Other Ambulatory Visit: Payer: Self-pay | Admitting: Psychiatry

## 2023-04-15 ENCOUNTER — Other Ambulatory Visit: Payer: Self-pay | Admitting: Family Medicine

## 2023-04-15 ENCOUNTER — Other Ambulatory Visit: Payer: Self-pay | Admitting: Psychiatry

## 2023-04-15 DIAGNOSIS — F332 Major depressive disorder, recurrent severe without psychotic features: Secondary | ICD-10-CM | POA: Diagnosis not present

## 2023-04-15 DIAGNOSIS — F411 Generalized anxiety disorder: Secondary | ICD-10-CM

## 2023-04-16 ENCOUNTER — Encounter (HOSPITAL_BASED_OUTPATIENT_CLINIC_OR_DEPARTMENT_OTHER): Payer: Self-pay | Admitting: Radiology

## 2023-04-16 ENCOUNTER — Ambulatory Visit: Payer: BC Managed Care – PPO | Admitting: Family Medicine

## 2023-04-16 ENCOUNTER — Ambulatory Visit (HOSPITAL_BASED_OUTPATIENT_CLINIC_OR_DEPARTMENT_OTHER)
Admission: RE | Admit: 2023-04-16 | Discharge: 2023-04-16 | Disposition: A | Discharge status: Home / Self Care | Payer: BC Managed Care – PPO | Source: Ambulatory Visit | Attending: Family Medicine | Admitting: Family Medicine

## 2023-04-16 ENCOUNTER — Other Ambulatory Visit (HOSPITAL_BASED_OUTPATIENT_CLINIC_OR_DEPARTMENT_OTHER): Payer: Self-pay | Admitting: Family Medicine

## 2023-04-16 ENCOUNTER — Telehealth: Payer: Self-pay | Admitting: *Deleted

## 2023-04-16 VITALS — BP 119/79 | HR 78 | Temp 97.2°F | Ht 68.0 in | Wt 214.2 lb

## 2023-04-16 DIAGNOSIS — E669 Obesity, unspecified: Secondary | ICD-10-CM | POA: Diagnosis not present

## 2023-04-16 DIAGNOSIS — Z1231 Encounter for screening mammogram for malignant neoplasm of breast: Secondary | ICD-10-CM

## 2023-04-16 DIAGNOSIS — E1169 Type 2 diabetes mellitus with other specified complication: Secondary | ICD-10-CM

## 2023-04-16 DIAGNOSIS — F332 Major depressive disorder, recurrent severe without psychotic features: Secondary | ICD-10-CM | POA: Diagnosis not present

## 2023-04-16 DIAGNOSIS — Z7984 Long term (current) use of oral hypoglycemic drugs: Secondary | ICD-10-CM

## 2023-04-16 DIAGNOSIS — M797 Fibromyalgia: Secondary | ICD-10-CM | POA: Diagnosis not present

## 2023-04-16 DIAGNOSIS — R52 Pain, unspecified: Secondary | ICD-10-CM | POA: Diagnosis not present

## 2023-04-16 DIAGNOSIS — Z7985 Long-term (current) use of injectable non-insulin antidiabetic drugs: Secondary | ICD-10-CM

## 2023-04-16 LAB — MICROALBUMIN / CREATININE URINE RATIO
Creatinine,U: 89.2 mg/dL
Microalb Creat Ratio: 1.1 mg/g (ref 0.0–30.0)
Microalb, Ur: 0.9 mg/dL (ref 0.0–1.9)

## 2023-04-16 MED ORDER — PREGABALIN 75 MG PO CAPS: 75.0000 mg | ORAL_CAPSULE | Freq: Two times a day (BID) | ORAL | 5 refills | Status: DC

## 2023-04-16 MED ORDER — CELECOXIB 100 MG PO CAPS: 100.0000 mg | ORAL_CAPSULE | Freq: Two times a day (BID) | ORAL | 5 refills | Status: DC

## 2023-04-16 NOTE — Assessment & Plan Note (Signed)
Last A1c stable 5.5.  Too early to recheck today.  Continue Mounjaro 15 mg weekly and Synjardy 12.07-998 twice daily.  Recheck A1c next office visit.

## 2023-04-16 NOTE — Progress Notes (Signed)
   Deborah Soto is a 59 y.o. female who presents today for an office visit.  Assessment/Plan:  Chronic Problems Addressed Today: Diffuse pain She is doing very well today.  She would like to continue to decrease dose of meds.  We will decrease her Lyrica  to 75 mg twice daily.  Will also decrease her Celebrex  200 mg twice daily.  She will come back to see me in a month and we can continue to titrate as needed.  Type 2 diabetes mellitus with obesity (HCC) Last A1c stable 5.5.  Too early to recheck today.  Continue Mounjaro  15 mg weekly and Synjardy  12.07-998 twice daily.  Recheck A1c next office visit.  MDD (major depressive disorder), recurrent severe, without psychosis (HCC) Overall mood is very well-controlled.  Continue management per psychiatry.     Subjective:  HPI:  See Assessment / plan for status of chronic conditions.  Patient is here today for follow-up.  I last saw her a couple months ago.  At that time we were working on decreasing her dose of Lyrica  and had decreased it to 150 mg twice daily.  Her A1c at that time was stable at 5.5 on Mounjaro  15 mg weekly and Synjardy  12.07-998 twice daily.   She has been following with psychiatry since our last visit. Her mood has been well controlled. At our last visit, we did decrease her Lyrica  to 150 mg twice daily. She has done well with this. She is interested in decreasing dose of her medications further.        Objective:  Physical Exam: BP 119/79   Pulse 78   Temp (!) 97.2 F (36.2 C) (Temporal)   Ht 5' 8 (1.727 m)   Wt 214 lb 3.2 oz (97.2 kg)   LMP  (LMP Unknown) Comment: last period ---  1 year ago (?)  SpO2 98%   BMI 32.57 kg/m   Wt Readings from Last 3 Encounters:  04/16/23 214 lb 3.2 oz (97.2 kg)  04/10/23 215 lb (97.5 kg)  02/13/23 217 lb (98.4 kg)  Gen: No acute distress, resting comfortably Neuro: Grossly normal, moves all extremities Psych: Normal affect and thought content      Deborah Yott M. Kennyth,  MD 04/16/2023 9:30 AM

## 2023-04-16 NOTE — Telephone Encounter (Signed)
 Copied from CRM [id number]. Topic: General - Other >> Apr 16, 2023 10:10 AM Corin V wrote: Reason for CRM: FYI: Imaging Center called to let Dr. Kennyth know patient is getting her mammogram today since she has been delayed in completing it.   Noted  Arvell Pulsifer,RMA

## 2023-04-16 NOTE — Patient Instructions (Addendum)
 It was very nice to see you today!  We will decrease your Celebrex  to 100 mg twice daily and Lyrica  to 75 mg twice daily.   Return in about 1 month (around 05/14/2023).   Take care, Dr Kennyth  PLEASE NOTE:  If you had any lab tests, please let us  know if you have not heard back within a few days. You may see your results on mychart before we have a chance to review them but we will give you a call once they are reviewed by us .   If we ordered any referrals today, please let us  know if you have not heard from their office within the next week.   If you had any urgent prescriptions sent in today, please check with the pharmacy within an hour of our visit to make sure the prescription was transmitted appropriately.   Please try these tips to maintain a healthy lifestyle:  Eat at least 3 REAL meals and 1-2 snacks per day.  Aim for no more than 5 hours between eating.  If you eat breakfast, please do so within one hour of getting up.   Each meal should contain half fruits/vegetables, one quarter protein, and one quarter carbs (no bigger than a computer mouse)  Cut down on sweet beverages. This includes juice, soda, and sweet tea.   Drink at least 1 glass of water with each meal and aim for at least 8 glasses per day  Exercise at least 150 minutes every week.

## 2023-04-16 NOTE — Assessment & Plan Note (Signed)
Overall mood is very well-controlled.  Continue management per psychiatry.

## 2023-04-16 NOTE — Assessment & Plan Note (Signed)
 She is doing very well today.  She would like to continue to decrease dose of meds.  We will decrease her Lyrica  to 75 mg twice daily.  Will also decrease her Celebrex  200 mg twice daily.  She will come back to see me in a month and we can continue to titrate as needed.

## 2023-04-17 ENCOUNTER — Encounter: Payer: Self-pay | Admitting: Family Medicine

## 2023-04-17 NOTE — Progress Notes (Signed)
 Urine sample is normal.  Deborah Hogg M. Daneil Dunker, MD 04/17/2023 7:30 AM

## 2023-04-18 DIAGNOSIS — F332 Major depressive disorder, recurrent severe without psychotic features: Secondary | ICD-10-CM | POA: Diagnosis not present

## 2023-04-22 ENCOUNTER — Other Ambulatory Visit: Payer: Self-pay | Admitting: Psychiatry

## 2023-04-22 ENCOUNTER — Other Ambulatory Visit: Payer: Self-pay | Admitting: Family Medicine

## 2023-04-22 DIAGNOSIS — R928 Other abnormal and inconclusive findings on diagnostic imaging of breast: Secondary | ICD-10-CM

## 2023-04-22 DIAGNOSIS — F339 Major depressive disorder, recurrent, unspecified: Secondary | ICD-10-CM

## 2023-04-23 DIAGNOSIS — F332 Major depressive disorder, recurrent severe without psychotic features: Secondary | ICD-10-CM | POA: Diagnosis not present

## 2023-04-27 ENCOUNTER — Other Ambulatory Visit: Payer: Self-pay | Admitting: Family Medicine

## 2023-04-29 ENCOUNTER — Other Ambulatory Visit: Payer: Self-pay | Admitting: Family Medicine

## 2023-04-29 ENCOUNTER — Ambulatory Visit
Admission: RE | Admit: 2023-04-29 | Discharge: 2023-04-29 | Disposition: A | Discharge status: Home / Self Care | Payer: BC Managed Care – PPO | Source: Ambulatory Visit | Attending: Family Medicine | Admitting: Family Medicine

## 2023-04-29 DIAGNOSIS — R928 Other abnormal and inconclusive findings on diagnostic imaging of breast: Secondary | ICD-10-CM

## 2023-04-29 DIAGNOSIS — N6320 Unspecified lump in the left breast, unspecified quadrant: Secondary | ICD-10-CM

## 2023-04-29 DIAGNOSIS — R92322 Mammographic fibroglandular density, left breast: Secondary | ICD-10-CM | POA: Diagnosis not present

## 2023-04-29 DIAGNOSIS — M7989 Other specified soft tissue disorders: Secondary | ICD-10-CM

## 2023-04-29 DIAGNOSIS — N6489 Other specified disorders of breast: Secondary | ICD-10-CM | POA: Diagnosis not present

## 2023-04-30 DIAGNOSIS — F332 Major depressive disorder, recurrent severe without psychotic features: Secondary | ICD-10-CM | POA: Diagnosis not present

## 2023-05-06 DIAGNOSIS — J3489 Other specified disorders of nose and nasal sinuses: Secondary | ICD-10-CM | POA: Diagnosis not present

## 2023-05-07 DIAGNOSIS — F332 Major depressive disorder, recurrent severe without psychotic features: Secondary | ICD-10-CM | POA: Diagnosis not present

## 2023-05-13 DIAGNOSIS — F332 Major depressive disorder, recurrent severe without psychotic features: Secondary | ICD-10-CM | POA: Diagnosis not present

## 2023-05-14 DIAGNOSIS — F332 Major depressive disorder, recurrent severe without psychotic features: Secondary | ICD-10-CM | POA: Diagnosis not present

## 2023-05-15 ENCOUNTER — Encounter: Payer: Self-pay | Admitting: Family Medicine

## 2023-05-15 ENCOUNTER — Ambulatory Visit: Payer: BC Managed Care – PPO | Admitting: Family Medicine

## 2023-05-15 VITALS — BP 112/74 | HR 86 | Temp 97.3°F | Ht 68.0 in | Wt 212.4 lb

## 2023-05-15 DIAGNOSIS — E1169 Type 2 diabetes mellitus with other specified complication: Secondary | ICD-10-CM | POA: Diagnosis not present

## 2023-05-15 DIAGNOSIS — Z7985 Long-term (current) use of injectable non-insulin antidiabetic drugs: Secondary | ICD-10-CM | POA: Diagnosis not present

## 2023-05-15 DIAGNOSIS — R52 Pain, unspecified: Secondary | ICD-10-CM

## 2023-05-15 DIAGNOSIS — M797 Fibromyalgia: Secondary | ICD-10-CM

## 2023-05-15 DIAGNOSIS — E669 Obesity, unspecified: Secondary | ICD-10-CM

## 2023-05-15 DIAGNOSIS — Z6832 Body mass index (BMI) 32.0-32.9, adult: Secondary | ICD-10-CM

## 2023-05-15 LAB — POCT GLYCOSYLATED HEMOGLOBIN (HGB A1C): Hemoglobin A1C: 5.6 % (ref 4.0–5.6)

## 2023-05-15 MED ORDER — PREGABALIN 50 MG PO CAPS: 50.0000 mg | ORAL_CAPSULE | Freq: Two times a day (BID) | ORAL | 5 refills | Status: DC

## 2023-05-15 MED ORDER — CELECOXIB 50 MG PO CAPS: 50.0000 mg | ORAL_CAPSULE | Freq: Two times a day (BID) | ORAL | 5 refills | Status: DC

## 2023-05-15 NOTE — Patient Instructions (Signed)
 It was very nice to see you today!  Your A1c today is 5.6.  Please keep up the great work with your diet and exercise.  We will decrease your dose of Celebrex and Lyrica today.  We will give you the pneumonia vaccine today.  I will see back in 3 months.  Come back sooner if needed.  Return in about 3 months (around 08/15/2023) for Follow Up.   Take care, Dr [name]  PLEASE NOTE:  If you had any lab tests, please let us know if you have not heard back within a few days. You may see your results on mychart before we have a chance to review them but we will give you a call once they are reviewed by [name].   If we ordered any referrals today, please let us know if you have not heard from their office within the next week.   If you had any urgent prescriptions sent in today, please check with the pharmacy within an hour of our visit to make sure the prescription was transmitted appropriately.   Please try these tips to maintain a healthy lifestyle:  Eat at least 3 REAL meals and 1-2 snacks per day.  Aim for no more than 5 hours between eating.  If you eat breakfast, please do so within one hour of getting up.   Each meal should contain half fruits/vegetables, one quarter protein, and one quarter carbs (no bigger than a computer mouse)  Cut down on sweet beverages. This includes juice, soda, and sweet tea.   Drink at least 1 glass of water with each meal and aim for at least 8 glasses per day  Exercise at least 150 minutes every week.

## 2023-05-15 NOTE — Assessment & Plan Note (Signed)
 A1c very well-controlled 5.6.  Continue Mounjaro 15 mg weekly and Synjardy 12.07-998 twice daily.  She is doing a great job with lifestyle modifications as well.  Recheck A1c in 3 to 6 months.

## 2023-05-15 NOTE — Assessment & Plan Note (Signed)
 Doing very well today.  She has done well with that the lower doses of Lyrica and Celebrex and would like to decrease further.  We will switch her Lyrica to 50 mg twice daily.  Will also switch her Celebrex to 50 mg twice daily.  She will send me a message in a few weeks via MyChart and we can adjust as needed.

## 2023-05-15 NOTE — Progress Notes (Signed)
   Deborah Soto is a 58 y.o. female who presents today for an office visit.  Assessment/Plan:  Chronic Problems Addressed Today: Type 2 diabetes mellitus with obesity (HCC) A1c very well-controlled 5.6.  Continue Mounjaro 15 mg weekly and Synjardy 12.07-998 twice daily.  She is doing a great job with lifestyle modifications as well.  Recheck A1c in 3 to 6 months.  Diffuse pain Doing very well today.  She has done well with that the lower doses of Lyrica and Celebrex and would like to decrease further.  We will switch her Lyrica to 50 mg twice daily.  Will also switch her Celebrex to 50 mg twice daily.  She will send me a message in a few weeks via MyChart and we can adjust as needed.    Subjective:  HPI:  See Assessment / plan for status of chronic conditions. Patient here today for follow up. She is doing well today.  At our last visit we decreased her Lyrica to 75 mg twice daily and Celebrex to 100 mg twice daily.  She has done well with this and would like to decrease further.  She is doing a great job with diet and exercise.  Been consistent with her medications..        Objective:  Physical Exam: BP 112/74   Pulse 86   Temp (!) 97.3 F (36.3 C) (Temporal)   Ht 5\' 8"  (1.727 m)   Wt 212 lb 6.4 oz (96.3 kg)   LMP  (LMP Unknown) Comment: last period ---  1 year ago (?)  SpO2 98%   BMI 32.30 kg/m   Wt Readings from Last 3 Encounters:  05/15/23 212 lb 6.4 oz (96.3 kg)  04/16/23 214 lb 3.2 oz (97.2 kg)  04/10/23 215 lb (97.5 kg)  Gen: No acute distress, resting comfortably CV: Regular rate and rhythm with no murmurs appreciated Pulm: Normal work of breathing, clear to auscultation bilaterally with no crackles, wheezes, or rhonchi Neuro: Grossly normal, moves all extremities Psych: Normal affect and thought content      Shawna Wearing M. [name], MD 05/15/2023 9:58 AM

## 2023-05-21 DIAGNOSIS — F332 Major depressive disorder, recurrent severe without psychotic features: Secondary | ICD-10-CM | POA: Diagnosis not present

## 2023-05-23 ENCOUNTER — Ambulatory Visit: Payer: BC Managed Care – PPO | Admitting: Psychiatry

## 2023-05-23 ENCOUNTER — Encounter: Payer: Self-pay | Admitting: Psychiatry

## 2023-05-23 DIAGNOSIS — F411 Generalized anxiety disorder: Secondary | ICD-10-CM | POA: Diagnosis not present

## 2023-05-23 DIAGNOSIS — G4710 Hypersomnia, unspecified: Secondary | ICD-10-CM

## 2023-05-23 DIAGNOSIS — G4733 Obstructive sleep apnea (adult) (pediatric): Secondary | ICD-10-CM | POA: Diagnosis not present

## 2023-05-23 DIAGNOSIS — F339 Major depressive disorder, recurrent, unspecified: Secondary | ICD-10-CM

## 2023-05-23 MED ORDER — OLANZAPINE 5 MG PO TABS: 5.0000 mg | ORAL_TABLET | Freq: Every day | ORAL | 1 refills | Status: DC

## 2023-05-23 MED ORDER — LITHIUM CARBONATE ER 450 MG PO TBCR: 675.0000 mg | EXTENDED_RELEASE_TABLET | Freq: Every day | ORAL | 1 refills | Status: DC

## 2023-05-23 NOTE — Progress Notes (Signed)
 [name] [id number] July 09, 1965 58 y.o.     Subjective:   Patient ID:  Deborah Soto is a 58 y.o. (DOB 25-Feb-1966) female.  Chief Complaint:  Chief Complaint  Patient presents with   Follow-up   Depression   Anxiety   Depression        Associated symptoms include fatigue and myalgias.  Associated symptoms include no decreased concentration.   Deborah Soto presents to the office today for follow-up of TRD and anxiety, and insomnia.  seen December 08, 2018.  She was complaining of lithium tremor and some cognitive problems.  Hydroxyzine was stopped.  Propranolol increased to 60 mg twice daily for tremor.  Lithium level and amitriptyline levels were requested. Amitriptyline level to 37, nortriptyline 49 on amitriptyline 250 mg nightly. Lithium level 0.800 mg daily  Phone call on October 6 to discuss lab results as follows: Note   ----- Message from [name]., MD sent at 12/15/2018 10:08 AM EDT ----- Lithium level stable at 0.8 in a good range.  The previous was 0.7.  She is having some tremor issues.   Normal BMP including excellent creatinine and calcium levels.  Blood sugar is high but she is aware of that problem.   TSH is within normal limits.   Serum amitriptyline level is 286 which is at the upper end of the normal range and suggestive that we not try further increase.  We will discuss further options at her follow-up appointment.   No medication adjustments required, but because of her tremor and her lithium level is slightly higher than it was with the prior level if she wants to reduce the lithium from 3 of the 300 mg tablets daily to 2-1/2 of the 300 mg tablets daily she can do so.  At her last appointment she was also encouraged to try a higher dose of propranolol and that may have resolved the problem.  If it did do not reduce the lithium but if it did not and she wants to reduce the dose she can do so as noted.  She should call us if she has any  recurrence of symptoms.   [name], [name]     She had repeat lithium level at 750 mg daily of 0.  7 with some improvement in tremor.  In phone call on November 20 she reported mood was worse with weepiness, overreacting, "pity party".  Because mood symptoms were worse with the reduction lithium she was encouraged to increase the dosage back to 900 mg daily.   Last seen February 04, 2019.  The following changes were made: Increase propranolol to 80 mg twice daily for lithium tremor and anxiety her blood pressure and pulse appear high enough that she can tolerate this dosage.  Disc Se.  Disc risk with low BP and DM.  He has not been having any problems with low blood sugar. Amitriptyline 6 or 300 mg daily if tolerated.  Try to take some about 4 -5 hours before sleep Try to take lithium also 4-5 hours before sleep to minimize daytime tremor Add methylphenidate ER 27 mg in the morning.  If no response we will increase the dosage.  Seen May 08, 2019 with her husband. Aside from physically feeling like she can't do much then having memory issues.  Forgetful.  Loses track of thoughts between tasks.  Most embarrassing being around people and word-finding issues.   Problems with walking bc balance and weakness.  Fears falling. Only fall at  Xmas morning.  Tremor is awful.  Affects keying.   H notices cognitive problems and forgetfulness.    Shaking got really bad and got lithium level as low as 600 mg but the next day felt really helpless and everything seemed to be aimed at hurting me and felt disrespected.  Problems with boss and 57 yo taking 5 AP classes and 2 volleyball classes.  I've been on her to accomplish all these things.  She claimed that pt screaming at her.   H CO she was more irritable after the reduction in lithium.   H CO her anxiety also seems worse.  H says the last year has been unhealthy with 12 hour work days for her.   Doesn't go out much.    Attention problems more  noticeable.  Still dropping and tremor.  Tolerated the increase in propranolol.    CO shakiness from lithium which interferes with typing. Also a good bit of anxiety generally without panic.  BS are high.  NotTaking hydroxyzine.   sleeping better and still using CPAP.  Using CPAP regularly. Less awakening than before.  No SE. depression was worse after reducing the lithium.  Irritability was also worse after reducing the lithium to 600 mg daily.  seen May 08, 2019.  Multiple changes made:  Reduce propranolol to 60 mg twice daily for lithium tremor and anxiety since the increase was not helpful.   Restart B6 for lithium tremor. 500 mg BID. She forgot and doesn't want more. Try to take lithium also 4-5 hours before sleep to minimize daytime tremor. depression was worse after reducing the lithium.  Irritability was also worse after reducing the lithium to 600 mg daily. So reluctant to reduce it further.  It is unclear how to address the benefits of lithium without using lithium other than to consider Spravato  Increase methylphenidate ER 36 mg in the morning. Increase methylphenidate ER 36 mg in the morning.  Wait 1-2 weeks then reduce amitriptyline to 5 tablets nightly to see if cognition is better. Also saw [name] re: sleep disorder.  05/2019 appt with the following noted: Lost Rx of Concerta 36 so didn't take it long.  Out of it.  Still shakey and cognitive problems.  Dep 5/10/  Anxiety 6/10 worse at night.  No SI.  8 hours sleep.  Mild panic couple times daily. Frustrated with tremor and sleepiness which is worse than tiredness.   History of low vitamin D taking 150K/week but stopped 6 mos ago. Plan : Modafinil 100 mg tablet 1 dailly for 1 week, Then add Concerta 36 mg 1 each AM.  Option increase modafinil mid April  Restart B6 for lithium tremor. 500 mg BID. Get lab test at earliest convience  10/14/2019 appointment with the following noted: Has been on amitriptyline 250 mg for  mos.  Tried a week ago reducing to 200 and felt worse so back on 250. Weaned off Lyrica about a month ago.  Not much change off it.   Not sure if it helped energy or alertness. Saw Dr. Nonah Mattes in consultation. Mental clarity is a lot better with stimulants and not sleeping as much in daytime with current meds.  Severe drowsiness is a lot better.   Off metformin and added Glipizide.   Not great with depression 5/10.  Anxiety can trigger problems with thinking and somatic sx   I feel like it's something physical and can keep her up at night. Occ racing heart or feeling hot.  Caffeine variable.  Plan:Plan: Increase methylphenidate from 36 to 54 mg in the morning. Increase amitriptyline to 6 tablets in the evening    11/12/19 TC noting call: Pt called stated she's not feeling well at all. Ask for sooner apt than 10/7. Depression, anxiety & focus is much worse. Has not planned to hurt herself but thoughts of things would be better if I was not here have crossed her mind often. Contact ASAP @ [phone].  I put Pt on canc list  MD response: I reviewed the meds and previous med lists.  The next options are either Spravato or a major med change from amitriptyline which is not something I can do outside of an appt.  She's on cancellation list and we'll try to work her in sooner.  Nothing I can change right now except since the increase in amitriptyline has not helped, she can reduce it to 200 mg nightly in preparation for other changes to come. Nursing response after talking with pt: Discussed symptoms and medications with patient, she did not increase her Amitriptyline 50 mg up to 6 tablets so she will start that today. Said you may have told her that but she didn't remember. She has read about Spravato but she is not wanting to proceed with that. Informed her she's on cancellation list as well. She will call back with worsening signs or symptoms.  11/23/19 appt with the following noted: Forgot to increase  amitriptyline until noted. Tolerating it OK but hasn't had time to help. More alert with Concerta increase to 54 mg daily also with modafinil.  A little more focused and better productivity.  Still forgetful.   No SE noted except GI issues from diarrhea to constipation.   Still miserable and cry easily.  Try to numb herself.  Always tired especially emotionally.  Anxiety is high and predates current stimulants. No therapy since [facility].  She felt he pushed too hard for her to make changes.  I don't feel like I can deal with him right now. Plan: Continue meds & The increase in amitriptyline to 300 mg daily needs more time.  01/12/2020 appointment with the following noted: Not well.  Mood horrible.  Easily frustrated sometimes without reason and may nap or cry.  Anxiety is pretty bad.  Daytime sleepiness seems worse.  Can get nausea from anxiety.  Feels like function is slow.  Have to write everything down.  Hates home, job, high school volleyball program.  At some point I have to take responsibility.   Amitriptyline also not helping pain any.  Clenching jaw for 3 weeks. Likcking lips.  Picking fingers. D will play volleyball at [employer] and [place]. Plan: Reduce amitriptyline by 1 tablet every 4 days. Start Viibryd 10 mg daily with food for 7 days, then 20 mg daily for 7 days, then increase to 40 mg daily.  03/18/20 appt urgently made and noted:  Seen with husband. Still with jaw clenching day and night despite off stimulant. Now on Viibryd 60 mg since 03/02/20 and no stimulant., lithium 900 mg, and propranolol 80 BID. Rare hydroxyzine DT sleepiness, Gabapentin 300 TID. Not taking it with food.  LT sensitivity to noise and light and overstimulated and now bothering her more at work.  Always had to use shirts without tags and sensitivity to smells. Easily agitated in public per H.  Gets negative and wants to give up and not live anymore.  She says there's less chance of acting on it if she tells  H about it and she's doing so now.  Pain worse off amitriptyline esp around ribs and general diffuse pain.  Anxiety worse with stimulants and hasn't gone away.  Trouble staying asleep. Plan: Need to take Viibryd 60 with food emphasized.  She's not currently doing so. Restart low-dose amitriptyline for pain  04/22/2020 appointment with following noted: Only restarted amitriptyline 50 mg last week.  Stays asleep better with it.  Sleep 8-9 hours now but no change in pain yet.  Sleepier in day recently. Done a lot of travel lately. Maybe viibryd making a little difference with mood and anxiety now that has been on it longer.  Needs to take Viibryd in AM bc seems to interfere with sleep. Final season of travel volleyball with good team. H notes still some irritability. Pain worse off amitriptyline.  Can reduce to  amitriptyline 25 for pain if 50 mg remains too sedating.  Is sleeping better.      06/21/2020 appointment with following noted:  Seen with H Tolerating amitriptyline 50 mg HS with decent sleep. Really crappy and as bad as 10 years.  Situationally life sucks.  Work environment is bad with coworkers.  Since Sept work is bad.  Doesn't feel people are supportive at home or work. H working 12 hour days.  When she's off wants to sleep all day.   Still in therapy with Thayer Ohm.  Is good therapist. 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  Check lithium level Referred to Dr. Evelene Croon for [facility]  11/03/2020 appointment with the following noted: 58 yo D got Covid but is OK. Still doing Spravato 84 mg twice weekly and plans to continue for awhile. And for a total of 3 mos.  Tolerated well.  It's a nice feeling.   Didn't seem to help the depression for a couple of months.   It does feel different. SI has resolved lately which is particularly impressive given she  has a lot of free time. She has lot s of time with nothing to do bc quit her job. Job environment got worse and worse.  Had been there 9 years.   Doing PT for pain in her back.  Ongoing physical issues.   Mood clearly better with Spravato but not resolved.   Neck fusion recently. Plan: check lithium level Reduce olanzapine to 5 mg HS  12/30/2020 appointment with the following noted: Surges of anxious feelings in her chest without heart racing several times daily last 15 mins and without pcpt.  Stay a little on edge all the time.   Left job and too much time on her hands. 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  She's doing well statistically.  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 Spravato twice weekly.  Is better with it but still some mild cycling of depression.  Still upset over leaving work.  SI very rare and fleeting.  Had a really good plan but doesn't dwell on the plan. Lithium level 0.7 on 900 mg HS. Reduced olanzapine to 5 mg HS and has felt a little worse. Viibryd 60 mg daily. Amitrptyline 50 for pain.  Not sure if it helps.  PT helped a lot with tension in neck and down spine. Sleeping pretty well after 8 hours. Plan increase olanzapine back to 10 mg nightly for treatment resistant depression and anxiety.  Anxiety was worse after  reducing it and no symptoms were improved after reducing it. Try to stop amitriptyline because it is probably not needed in the presence of olanzapine. Check lithium level  03/14/2021 appointment noted: Increase olanzapine to 10 mg for anxiety spells that are unexpected and hit her in the chest esp in the morning. Very low right now.  Twice weekly Spravato and felt good for awhile.  Maybe a slump after Xmas.  Not much social interaction since she quit her job.  Xmas gave her focus.  Doesn't have this now.   Mark working 14-16 hours daily.  This won't get better. Kids are doing great but not around Sleep well generally and that is a benefit of  olanzapine. Tolerates it well.   Plan: Trial increase olanzapine to 15 mg HS  05/03/2021 appointment with the following noted: Still getting Spravato twice weekly.   I love it.  Totally relaxed Depression and anxiety SI is gone or fleeting with Spravato. Still in therapy with Anson Fret and thinks a lot of stuff go back to childhood.  Was always criticized for normal childhood mistakes.  So coped by overcompensating with going big and grand. Can't get energy up to do anything. Never felt she had a manic periods except overstimulated CC anxious feeling in body tingling and tight uncomfortable feeling.  Reminds her of stimulant effects but less intense and worse in AM. Everything still seems to hard like going to grocery store and worry over messing up cooking. Watches too much TV For a little while increase olanzapine seemed to help.  Tolerating it. Depression is not that bad compared to the past. Plan: no med changes  07/17/21 appt noted:  seen with H Depression with twice weekly Spravato. But a lot of general anxiety and then spells of getting tingly and nervous.  Not triggers.   Using propranolol BID. Anxiety is continuation of the way it was before.  Doesn't seem to vary with time of day.  Awakens with anxiety. No SE noted except sleepy.  Sleeping at least 10-11 hours.  Not obviously worse with increase olanzapine.  Viibryd in AM bc insomnia otherwise. Balance trouble with hunched walk and she feels like legs are waker than they should be. Plan: Stop regular propranolol and start ER propranolol ER 1 at night for anxiety Reduce clonidine to 1/2 tablet twice daily Wait 1 week and reduce olanzapine to 1/2 tablet nightly to see if energy, alertness and balance are better. Starting June 1 stop clonidine.  09/05/21 appt noted: Still low energy.  No changes with less meds. Anxiety a little better last week for unclear reasons. She feels olanzapine helps sleep and doesn't want to stop it.   Sleep a lot.  11-11 if she can. Therapist working with her on childhood trauma. Long period of depression. Plan: Start Auvelity 1 daily for a week then 1 twice daily  11/27/21 appt noted:  Couple of phone calls and tremor problems and needed to reduce lithium to 1 and 1/2 tablets and it helped reduce the tremor. Maybe a little improvement.  Not so depressed but kind of numb.   Spells of anxiety but a little better.  Can feel it in chest and arms at times every other night.   Continues spravato twice weekly.   Sleep is great. Off clonidne.  Contiues Aubelity twice daily, lithium 675 mg daily, olanzapine 5 mg HS, viibryd 60 mg daily. Sleep from 11-9 and sleeps well. Plan: Trial reduction in olanzapine to 2.5 mg nightly.  02/14/22 appt noted: Noticed FM got better in summer and then relapsed.  Balance worth with FM also.   Did reduce Spravato from twice weekly to weekly in October.  Did it twice weekly for a year now.  Thinks maybe that is why FM flared.  Continues to weekly. No SI in over a years.  Better dresssed and using makeup now.  Wasn't for about 5 years.. Getting closer to feeling normal .  Depression is still good. Reduced and increased gabapentin to 900 BID. No other med changes except baclofen prn which is marginally helpful. Feels better and got motivated to buy a car.  She's more active and less depressed than she has been.  Gets showered and dress daily before leaving [place]. Less numb emotionally than when last here.  Some excitement. Asks about coming off the Auvelity to reduce meds. A1C is 5.   Sleep is better and using CPAP Plan: no med change.  Continue Auvelity 1 twice daily, continue lithium CR 450 mg tablets 2 nightly, continue olanzapine 2.5 mg nightly, continue propranolol ER 160 mg daily, continue Viibryd 60 mg daily.  05/16/22 appt noted: Meds as above.  Note she was prescribed Mounjaro. switched from gabapentin to Lyrica 150 mg twice daily,  Missed Auvelity 7 days  and felt better when restarted it and I think it helps. Spravato twice weekly early Jan hoping to help pain but didn't. Still dealing with chronic pain esp around abd but also general aches.  Lyrica has helped some No SE Doing well other than pain.  Had a dip in mood late DEC into early Feb without SI but not interested but better now. Interest in cookie decorating and to make for Maggie's wedding in next year. Plan no changes  08/01/22 appt noted: Psych med: Auvelity BID, lithium CR 675 mg nightly, olanzapine 2.5 mg nightly, propranolol ER 160 mg daily, Viibryd 60 mg daily, vitamin D 86578 units weekly.  Also on pregabalin 150 mg every morning and 300 mg nightly for fibromyalgia pain.  Spravato twice weekly. Doing pretty well and better than usual.   Still in therapy with [facility].  Dealt with feelings worthlessness out of childhood.  Parents were very critical.  Has let a lot of this go. This is best I've felt in a long time.   Tolerating meds. New hobby is baking.   Going to see M in [place] in July which could be triggering.  Sister is there too and much like her. Sister can be critical too.  Her kids are going too.   Had a nice mother's Day affirming.  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   Sleep is good. Plan no changes  10/03/22 appt noted: Psych med: Auvelity BID, lithium CR 675 mg nightly, olanzapine 2.5 mg nightly, propranolol ER 160 mg daily, Viibryd 60 mg daily, vitamin D 46962 units weekly.  Also on pregabalin 150 mg every morning and 300 mg nightly for fibromyalgia pain.  Spravato twice weekly. Hesitant to try cutting back to once weekly Spravato bc upcoming family vacation. Doing therapy .  Father is passed.  M can be dismissive and critical.  Wrote a letter letting go of the past.   M on O2.  Family in [place].   Better energy.  Big family gathering this next week.   Dealing with DM unstable craving.   Plan no med changes  01/03/23 appt noted; No  med changes, continue: Auvelity BID, lithium CR 675 mg  nightly, olanzapine 2.5 mg nightly, propranolol ER 160 mg daily, Viibryd 60 mg daily, vitamin D 19147 units weekly.  Also on pregabalin 150 mg every morning and 300 mg nightly for fibromyalgia pain.  Spravato twice weekly. Would like to cut Spravato to once weekly bc has things to do.  Involved in campaigns.  Passionate about it.   Mood is much better.  Doing really well overall and seeing Thayer Ohm every other week.   Made big progress and feels like a blanket taken off.  Write a lot of therapeutic letters.  Gotten very busy since then.  Will start baking again and [employer] business.  Enjoying the research.   Involved in early voting outreach.  D's teachers. Couple of times forgetting night time meds.  Poor sleep if forgets night meds.   Having fun in life.    03/25/23 appt noted: No med changes, continue: Auvelity BID, lithium CR 675 mg nightly, olanzapine 2.5 mg nightly, propranolol ER 160 mg daily, Viibryd 60 mg daily, vitamin D 82956 units weekly.  Also on pregabalin 150 mg every morning and 300 mg nightly for fibromyalgia pain.  Spravato twice weekly. Still getting Spravato went to once weekly. Been ok.  Some of the excitmemt has leveled out from last viist. DT holdidays was spread out more than usual.  Don't like the holidays.  Could have been holidays.   Seeing therapist Thayer Ohm every other week.  Not really down now.  Even on low days nothing like before.   Wants to wean off lithium bc no longer suicidal.   Has lots of ideas but would like more energy.     Depression started after children born.      05/23/23 appt noted: Med: Auvelity BID, reduced and increased lithium CR 675 mg nightly, olanzapine 2.5 mg nightly, propranolol ER 160 mg daily, Viibryd 60 mg daily,  vitamin D 21308 units weekly.  Also reducing pregabalin 50 mg every morning and 50 mg nightly for fibromyalgia pain.  Spravato once weekly. No SE  D getting married next month.    Working on garden.  Started [employer] company to [employer]. Doing some PT work early Jan 8 hours / week which has been good so far.  Busy .   Generally mood wise happy but not sleepy that well.  Wonders about incr olanzapine.  More bad dreams 50% of time and some NM.  Sleep worse in the past month.   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  Had a rage reaction after reducing lithium to less lithium and better with incr back to 675 mg . Feel so much better and more outgoing.  Than before.  Pleased with Spravato.  Empty nester's Not having much FM pain.   DM is controlled.  Still trouble with wt.  Not binge eating.    ECT-MADRS    Flowsheet Row Office Visit from 05/03/2021 in [facility]  MADRS Total Score 32      GAD-7    Flowsheet Row Office Visit from 02/13/2023 in [facility]  Total GAD-7 Score 7      PHQ2-9    Flowsheet Row Office Visit from 05/15/2023 in [facility] [place] Office Visit from 04/16/2023 in [facility] [employer] Visit from 02/13/2023 in [facility] [employer] from 10/31/2022 in [facility] [employer] from 08/14/2022 in [facility] [place]  PHQ-2 Total Score 0 0 1 0 0  PHQ-9 Total Score 0 0 3 0 0      Flowsheet Row Admission (Discharged) from 08/17/2020 in [facility]  [facility] Pre-Admission Testing 60 from 08/16/2020 in [facility] PREADMISSION TESTING  C-SSRS RISK CATEGORY No Risk No Risk       Maggie 66 and dating.  Past Psychiatric Medication Trials:  Failed ECT and TMS,  Spravato started 07/2020 amitriptyline helped for 2 to 3 years in 1987,   2021 No benefit from amitriptyline 300 mg with adequate duration.  Nortriptyline 200 with a therapeutic blood level. Trintellix, duloxetine 120, Lexapro 20, Fetzima,   Wellbutrin, fluoxetine, paroxetine, sertraline,  (Poor response to SSRIs) Viibryd 60 [place],   lithium SE,  Latuda 120, Abilify 15,  Vraylar, Rexulti,Seroquel 400, risperidone, Geodon,  Haldol for agitation, lamotrigine 300,  buspirone, clonazepam, gabapentin, Propranolol, clonidine 0.1 mg BID modafinil,  Nuvigil, Vyvanse, Concerta Pramipexole SE compulsive  SHE DOESN'T WANT STIMULANTS AGAIN bc of anxiety. Trazodone,   GENESIGHT COMPLETED  history of overdose on Xanax, There is a history of suicide attempts and psychiatric hospitalizations.  New PCP good Jacquiline Doe   Review of Systems:  Review of Systems  Constitutional:  Positive for fatigue. unchanged Cardiovascular:  Negative for chest pain and palpitations.  Gastrointestinal:  Negative for diarrhea.  Musculoskeletal:  Positive for back pain and myalgias. Negative for gait problem.       Less pain lately.  Neurological:  Positive for mild tremors .  Negative for dizziness.  Psychiatric/Behavioral:  Positive for dysphoric mood. Negative for confusion, decreased concentration, hallucinations and self-injury. The patient is nervous/anxious.     Medications: I have reviewed the patient's current medications.  Current Outpatient Medications  Medication Sig Dispense Refill   celecoxib (CELEBREX) 50 MG capsule Take 1 capsule (50 mg total) by mouth 2 (two) times daily. 60 capsule 5   Continuous Blood Gluc Receiver (DEXCOM G7 RECEIVER) DEVI Use 4 times daily to check blood sugar 1 each 11   Continuous Glucose Sensor (DEXCOM G7 SENSOR) MISC USE AS DIRECTED FOUR TIMES DAILY TO CHECK BLOOD SUGAR 3 each 0   Dextromethorphan-buPROPion ER (AUVELITY) 45-105 MG TBCR Take 1 tablet by mouth 2 (two) times daily. 180 tablet 1   Empagliflozin-metFORMIN HCl ER (SYNJARDY XR) 25-1000 MG TB24 Take 1 tablet by mouth daily.     Esketamine HCl, 84 MG Dose, (SPRAVATO, 84 MG DOSE,) 28 MG/DEVICE SOPK Place 84 mg into the nose See admin instructions.  Use weekly     Ginger, Zingiber officinalis, (GINGER PO) Take by mouth.     Insulin Pen Needle (PEN NEEDLES) 33G X 4 MM MISC Use daily as needed to inject insulin 100 each 1   ipratropium (ATROVENT) 0.03 % nasal spray SMARTSIG:2 Spray(s) Both Nares 3 Times Daily PRN     lidocaine (LIDODERM) 5 % Place 1 patch onto the skin every 12 (twelve) hours. Remove & Discard patch within 12 hours or as directed by MD 30 patch 2   MOUNJARO 15 MG/0.5ML Pen INJECT 0.5MLS SUBCUTANEOUSLY ONCE A WEEK 6 mL 11   pantoprazole (PROTONIX) 40 MG tablet TAKE ONE TABLET BY MOUTH ONCE DAILY FOR STOMACH PROTECTION 30 tablet 5   pregabalin (LYRICA) 50 MG capsule Take 1 capsule (50 mg total) by mouth 2 (two) times daily. 60 capsule 5   propranolol ER (INDERAL LA) 160 MG SR capsule Take 1 capsule (  160 mg total) by mouth daily. 30 capsule 1   SYNJARDY XR 12.07-998 MG TB24 TAKE TWO TABLETS BY MOUTH DAILY 60 tablet 5   tretinoin (RETIN-A) 0.05 % cream SMARTSIG:Sparingly Topical Every Night     tretinoin (RETIN-A) 0.1 % cream nightly.     trimethoprim-polymyxin b (POLYTRIM) ophthalmic solution Place 2 drops into both eyes every 6 (six) hours. 10 mL 0   TURMERIC PO Take by mouth.     Vilazodone HCl (VIIBRYD) 40 MG TABS Take 1.5 tablets (60 mg total) by mouth daily. 135 tablet 0   Vitamin D, Ergocalciferol, (DRISDOL) 1.25 MG (50000 UNIT) CAPS capsule TAKE ONE CAPSULE BY MOUTH EVERY 7 DAYS 15 capsule 1   XARELTO 20 MG TABS tablet TAKE ONE TABLET BY MOUTH DAILY WITH SUPPER 30 tablet 5   lithium carbonate (ESKALITH) 450 MG ER tablet Take 1.5 tablets (675 mg total) by mouth daily in the afternoon. 135 tablet 1   OLANZapine (ZYPREXA) 5 MG tablet Take 1 tablet (5 mg total) by mouth at bedtime. 30 tablet 1   No current facility-administered medications for this visit.    Medication Side Effects: Sedation  Allergies:  Allergies  Allergen Reactions   Tramadol Other (See Comments)    Tingling all over    Hydrocodone     Ineffective      Past Medical History:  Diagnosis Date   Anemia    Anxiety    Asthma    Depression    Diabetes mellitus type 2 in obese 06/11/2016   Dyspnea    Embolism - blood clot January 1997 & January 2017   Also had LLE DVT in 1997   History of kidney stones    Hypertension    Preeclampsia 10/10/2011   1999    Sleep apnea     Family History  Problem Relation Age of Onset   Dementia Father    Heart disease Father    Clotting disorder Mother    Arthritis Mother    Clotting disorder Sister    Clotting disorder Maternal Grandmother    Clotting disorder Maternal Aunt    Rheumatologic disease Neg Hx    Hyperparathyroidism Neg Hx     Social History   Socioeconomic History   Marital status: Married    Spouse name: Not on file   Number of children: Y   Years of education: Not on file   Highest education level: Not on file  Occupational History   Occupation: admin assist    Comment: insurance verification  Tobacco Use   Smoking status: Former    Current packs/day: 0.00    Average packs/day: 1 pack/day for 20.0 years (20.0 ttl pk-yrs)    Types: Cigarettes    Start date: 03/12/1984    Quit date: 03/12/2004    Years since quitting: 19.2    Passive exposure: Past   Smokeless tobacco: Never  Vaping Use   Vaping status: Never Used  Substance and Sexual Activity   Alcohol use: Yes    Alcohol/week: 1.0 standard drink of alcohol    Types: 1 Cans of beer per week    Comment: 1-2 times a week   Drug use: No   Sexual activity: Yes    Partners: Male    Birth control/protection: None  Other Topics Concern   Not on file  Social History Narrative   Originally from [place]. Always lived in [place]. 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 No international travel recently. Previously has been to [place]  in May 1996. No mold exposure recently. No bird exposure. Does have multiple pets.    Social Drivers of Corporate investment banker Strain: Not on file  Food Insecurity: Not on file   Transportation Needs: Not on file  Physical Activity: Not on file  Stress: Not on file  Social Connections: Unknown (07/24/2021)   [facility]   Social Network    Social Network: Not on file  Intimate Partner Violence: Unknown (06/15/2021)   Received from [facility]   HITS    Physically Hurt: Not on file    Insult or Talk Down To: Not on file    Threaten Physical Harm: Not on file    Scream or Curse: Not on file    Past Medical History, Surgical history, Social history, and Family history were reviewed and updated as appropriate.   Please see review of systems for further details on the patient's review from today.   Objective:   Physical Exam:  LMP  (LMP Unknown) Comment: last period ---  1 year ago (?)  Physical Exam Constitutional:      General: She is not in acute distress. Musculoskeletal:        General: No deformity.  Neurological:     Mental Status: She is alert and oriented to person, place, and time.     Cranial Nerves: No dysarthria.     Coordination: Coordination normal.  Psychiatric:        Attention and Perception: Attention and perception normal. She does not perceive auditory or visual hallucinations.        Mood and Affect: Mood is euthymic. Affect is not labile, blunt, angry, tearful or inappropriate.    Not as severe as in the past. Not irritable.    Speech: Speech normal.        Behavior: Behavior normal. Behavior is cooperative.        Thought Content: Thought content normal. Thought content is not paranoid or delusional. Thought content does not include homicidal or suicidal ideation. Thought content does not include suicidal plan.        Cognition and Memory: Cognition and memory normal.        Judgment: Judgment normal.     Comments: Insight intact    Lab Review:     Component Value Date/Time   NA 141 04/11/2022 1054   NA 138 08/21/2019 0000   NA 140 12/11/2016 1236   K 4.5 04/11/2022 1054   K  4.2 12/11/2016 1236   CL 107 04/11/2022 1054   CO2 24 04/11/2022 1054   CO2 24 12/11/2016 1236   GLUCOSE 126 (H) 04/11/2022 1054   GLUCOSE 109 12/11/2016 1236   BUN 14 04/11/2022 1054   BUN 12 08/21/2019 0000   BUN 15.1 12/11/2016 1236   CREATININE 0.70 04/11/2022 1054   CREATININE 0.68 02/22/2020 1649   CREATININE 0.8 12/11/2016 1236   CALCIUM 9.9 04/11/2022 1054   CALCIUM 10.0 12/11/2016 1236   PROT 7.0 04/11/2022 1054   PROT 7.5 12/11/2016 1236   ALBUMIN 4.9 04/11/2022 1054   ALBUMIN 4.4 12/11/2016 1236   AST 30 04/11/2022 1054   AST 38 (H) 12/11/2016 1236   ALT 36 (H) 04/11/2022 1054   ALT 61 (H) 12/11/2016 1236   ALKPHOS 55 04/11/2022 1054   ALKPHOS 71 12/11/2016 1236   BILITOT 0.5 04/11/2022 1054   BILITOT 0.62 12/11/2016 1236   GFRNONAA >60 08/16/2020 1157   GFRNONAA 105 12/11/2018 1121  GFRAA 122 12/11/2018 1121       Component Value Date/Time   WBC 8.6 04/11/2022 1054   RBC 5.01 04/11/2022 1054   HGB 15.0 04/11/2022 1054   HGB 14.5 12/11/2016 1236   HCT 45.2 04/11/2022 1054   HCT 43.9 12/11/2016 1236   PLT 280.0 04/11/2022 1054   PLT 289 12/11/2016 1236   PLT 295 07/24/2016 1848   MCV 90.1 04/11/2022 1054   MCV 93.2 12/11/2016 1236   MCH 29.9 08/16/2020 1157   MCHC 33.1 04/11/2022 1054   RDW 14.5 04/11/2022 1054   RDW 13.5 12/11/2016 1236   LYMPHSABS 2.2 11/22/2020 1223   LYMPHSABS 2.4 12/11/2016 1236   MONOABS 0.6 11/22/2020 1223   MONOABS 0.7 12/11/2016 1236   EOSABS 0.1 11/22/2020 1223   EOSABS 0.7 (H) 12/11/2016 1236   BASOSABS 0.0 11/22/2020 1223   BASOSABS 0.0 12/11/2016 1236    Lithium Lvl  Date Value Ref Range Status  11/22/2020 0.7 0.6 - 1.2 mmol/L Final   11/22/20  lithium 0.7 on 900   Serum nortriptyline level on 150 mg a day was 109  Amitriptyline  Level 12/11/2018 =237  On 250 mg daily,.  12/23/2020 vitamin D level 31.38, vitamin B12 519  No results found for: "PHENYTOIN", "PHENOBARB", "VALPROATE", "CBMZ"   .res Assessment:  Plan:    Recurrent major depression resistant to treatment (HCC) - Plan: OLANZapine (ZYPREXA) 5 MG tablet, lithium carbonate (ESKALITH) 450 MG ER tablet  Generalized anxiety disorder - Plan: OLANZapine (ZYPREXA) 5 MG tablet  Hypersomnolence  Obstructive sleep apnea   Gracia has chronic severe major depression and generalized anxiety that is treatment resistant and failed multiple medications ECT and TMS as noted above including SSRIs, try cyclic's, lithium, and atypical antipsychotics for depression.. She has had partial benefit from amitriptyline plus lithium.  However  tremor problems from the lithium.  Efforts to reduce the lithium have led to worsening psychiatric symptoms.  There has been a discussion about possible bipolar elements but she has no clear manic episodes unrelated to medication changes.  She has features of atypical depression.  So an MAO inhibitor is attractive from that perspective. Not sig depressed and numbness better.  Good response.  Doing well then.    Discussed Spravato option in detail.  She is continuing Spravato.  Done well with reduced olanzapine to 2.5 mg HS for TRD until lately worsening sleep, temporarily increase for sleep to 5 mg HS For anxiety and depression.  Consider switch and retry pramipexole and push dose. Less numb  Statistically speaking the most potentially beneficial option would be Parnate or Nardil but they are difficult to use   Need to take Viibryd 60 with food emphasized.   check lithium  Worse impulisivity with reduced lithium.   So continue 450 HS  When tremor resolves then start taper of propranolol.  continue Auvelity 1 twice daily BC partial benefit Disc SE.   Her improvement seems to correlate with starting this.  continu counseling.  Good benefit seeing Kennon Rounds.. Counseling 20 min: Great progress dealing with childhood issues.  Helped current perspective.  Read letter in office written to parents, letting go and  forgiveness themes. Encourage physical activity and weight loss.  She is not particularly motivated and feels that is hard to accomplish because of chronic pain.  Chronic pain complicates her treatment of depression.   Lyrica helps pain.  Recent B12 deficiency dx and treatment started. vitamin D bc level 31 on 2000U daily  Hold B6  for lithium tremor. 500 mg BID. If needed.  Tremor is better.  None currently.  Try to take lithium also 4-5 hours before sleep to minimize daytime tremor. depression was worse after reducing the lithium.  Irritability was also worse after reducing the lithium to 600 mg daily.   Disc weith loss meds Monjarou recently started for DM and goals.  Last checked lost 17#.  BS is OK.  Has continuous glucose monitor. More benefit with Monjarou than semaglutide.  Take vitamin D regularly.  continue: Auvelity BID,  continue lithium CR 450 mg nightly,   Temporarily increase olanzapine 5 mg nightly,  propranolol ER 160 mg daily, Viibryd 60 mg daily, vitamin D 16109 units weekly.  Also on pregabalin 150 mg every morning and 300 mg nightly for fibromyalgia pain.  Spravato twice weekly.  Follow-up 8 weeks  [name], [name]  Future Appointments  Date Time Provider Department Center  06/14/2023  9:00 AM Rodolph Bong, MD [facility] None  08/16/2023  9:00 AM Ardith Dark, MD [facility]     No orders of the defined types were placed in this encounter.      -------------------------------

## 2023-05-27 ENCOUNTER — Other Ambulatory Visit: Payer: Self-pay | Admitting: Psychiatry

## 2023-05-27 ENCOUNTER — Other Ambulatory Visit: Payer: Self-pay | Admitting: Family Medicine

## 2023-05-27 DIAGNOSIS — F339 Major depressive disorder, recurrent, unspecified: Secondary | ICD-10-CM

## 2023-05-27 NOTE — Telephone Encounter (Signed)
 Changed dose

## 2023-05-28 DIAGNOSIS — F332 Major depressive disorder, recurrent severe without psychotic features: Secondary | ICD-10-CM | POA: Diagnosis not present

## 2023-05-29 ENCOUNTER — Telehealth: Payer: Self-pay | Admitting: Psychiatry

## 2023-05-29 DIAGNOSIS — F339 Major depressive disorder, recurrent, unspecified: Secondary | ICD-10-CM

## 2023-05-29 NOTE — Telephone Encounter (Signed)
 Deborah Soto called at 11:40 to request refill of her Lithium 450mg .  I told her she already had a prescription at the pharmacy.  She called the pharmacy, [facility] and they told her they didn't have one.  This is why they send this request.

## 2023-05-29 NOTE — Telephone Encounter (Signed)
 I show RF sent at appt 3/13. Called pharmacy and they said they didn't have a RF despite confirmation. Resent Rx.

## 2023-06-04 DIAGNOSIS — F332 Major depressive disorder, recurrent severe without psychotic features: Secondary | ICD-10-CM | POA: Diagnosis not present

## 2023-06-07 DIAGNOSIS — F332 Major depressive disorder, recurrent severe without psychotic features: Secondary | ICD-10-CM | POA: Diagnosis not present

## 2023-06-11 DIAGNOSIS — F332 Major depressive disorder, recurrent severe without psychotic features: Secondary | ICD-10-CM | POA: Diagnosis not present

## 2023-06-13 NOTE — Progress Notes (Unsigned)
   Rubin Payor, PhD, LAT, ATC acting as a scribe for [name], MD.  Deborah Soto is a 58 y.o. female who presents to [facility] today for cont'd R hip pain. Pt was last seen by Dr. Denyse Amass on 04/10/23 and was give a R GT steroid injection and was advised to cont HEP as previously taught by PT.  Today, pt reports her daughter's wedding is next weekend and she would like to be feeling good for the event. R hip pain is currently pretty good, pain w/ transitioning to stand. She notes she is tapering down on her Lyrica and Celebrex.  Dx imaging: 07/19/22 R hip XR             05/04/20 L-spine MRI             03/14/20 L-spine XR  Pertinent review of systems: No fevers or chills  Relevant historical information: History of diabetes and pulmonary embolism.   Exam:  BP 126/82   Pulse 94   Ht 5\' 8"  (1.727 m)   Wt 217 lb (98.4 kg)   LMP  (LMP Unknown) Comment: last period ---  1 year ago (?)  SpO2 97%   BMI 32.99 kg/m  General: Well Developed, well nourished, and in no acute distress.   MSK: Right hip normal-appearing normal motion intact strength.      Assessment and Plan: 58 y.o. female with left hip pain due to trochanteric bursitis.  Overall much better than it was.  We were considering doing a hip injection today at the greater trochanter.  This would be prior to her daughter's wedding to allow her to be a little more functional.  She notes that her pain is well-controlled enough that it does not make sense to do an injection.  I looked at my schedule for next week and we will tentatively schedule an appointment for Wednesday so that we can do an injection just in case we need to do 1.  If she feels well she can cancel the appointment.   PDMP not reviewed this encounter. Orders Placed This Encounter  Procedures   [name] LIMITED JOINT SPACE STRUCTURES LOW RIGHT(NO LINKED CHARGES)    Reason for Exam (SYMPTOM  OR DIAGNOSIS REQUIRED):   right hip pain     Preferred imaging location?:    [facility]   No orders of the defined types were placed in this encounter.    Discussed warning signs or symptoms. Please see discharge instructions. Patient expresses understanding.   The above documentation has been reviewed and is accurate and complete [name], M.D.

## 2023-06-14 ENCOUNTER — Other Ambulatory Visit: Payer: Self-pay

## 2023-06-14 ENCOUNTER — Other Ambulatory Visit: Payer: Self-pay | Admitting: Psychiatry

## 2023-06-14 ENCOUNTER — Ambulatory Visit: Payer: BC Managed Care – PPO | Admitting: Family Medicine

## 2023-06-14 VITALS — BP 126/82 | HR 94 | Ht 68.0 in | Wt 217.0 lb

## 2023-06-14 DIAGNOSIS — F411 Generalized anxiety disorder: Secondary | ICD-10-CM

## 2023-06-14 DIAGNOSIS — M25551 Pain in right hip: Secondary | ICD-10-CM | POA: Diagnosis not present

## 2023-06-14 NOTE — Patient Instructions (Signed)
 Thank you for coming in today.   Schedule for Wednesday, in case we need to do an injection. OK to cancel if feeling good

## 2023-06-16 ENCOUNTER — Other Ambulatory Visit: Payer: Self-pay | Admitting: Family Medicine

## 2023-06-17 DIAGNOSIS — F332 Major depressive disorder, recurrent severe without psychotic features: Secondary | ICD-10-CM | POA: Diagnosis not present

## 2023-06-18 DIAGNOSIS — F332 Major depressive disorder, recurrent severe without psychotic features: Secondary | ICD-10-CM | POA: Diagnosis not present

## 2023-06-19 ENCOUNTER — Ambulatory Visit: Admitting: Family Medicine

## 2023-06-25 DIAGNOSIS — F332 Major depressive disorder, recurrent severe without psychotic features: Secondary | ICD-10-CM | POA: Diagnosis not present

## 2023-06-26 ENCOUNTER — Encounter: Payer: Self-pay | Admitting: Family Medicine

## 2023-06-26 NOTE — Telephone Encounter (Signed)
**Note De-identified  [place] Obfuscation** Please advise 

## 2023-06-27 NOTE — Telephone Encounter (Signed)
 I think going back on the Celebrex first is reasonable. Please send in new prescription if needed and have her follow up with us  in a few weeks.

## 2023-07-01 NOTE — Telephone Encounter (Unsigned)
 Copied from CRM [id number]. Topic: Clinical - Medication Question >> Jul 01, 2023 10:43 AM Deborah Soto wrote: Reason for CRM: celecoxib  (CELEBREX ) 50 MG capsule  I ADVISED PATIENT OF MESSAGE AND SHE STATED THAT SHE'S OK WITH SENDING THE SCRIPT IN TODAY SO THAT SHE MAY START IT AS SOON AS POSSIBLE

## 2023-07-01 NOTE — Telephone Encounter (Signed)
 Spoke with patient, aware still has Rx Celebrex  refills at the pharmacy  Verbalized understanding

## 2023-07-02 DIAGNOSIS — F332 Major depressive disorder, recurrent severe without psychotic features: Secondary | ICD-10-CM | POA: Diagnosis not present

## 2023-07-09 DIAGNOSIS — F332 Major depressive disorder, recurrent severe without psychotic features: Secondary | ICD-10-CM | POA: Diagnosis not present

## 2023-07-12 ENCOUNTER — Other Ambulatory Visit: Payer: Self-pay | Admitting: Psychiatry

## 2023-07-12 DIAGNOSIS — F339 Major depressive disorder, recurrent, unspecified: Secondary | ICD-10-CM

## 2023-07-12 DIAGNOSIS — F411 Generalized anxiety disorder: Secondary | ICD-10-CM

## 2023-07-16 DIAGNOSIS — F332 Major depressive disorder, recurrent severe without psychotic features: Secondary | ICD-10-CM | POA: Diagnosis not present

## 2023-07-23 ENCOUNTER — Other Ambulatory Visit: Payer: Self-pay | Admitting: Family Medicine

## 2023-07-23 ENCOUNTER — Other Ambulatory Visit: Payer: Self-pay | Admitting: Psychiatry

## 2023-07-23 DIAGNOSIS — F411 Generalized anxiety disorder: Secondary | ICD-10-CM

## 2023-07-23 DIAGNOSIS — F339 Major depressive disorder, recurrent, unspecified: Secondary | ICD-10-CM

## 2023-07-23 DIAGNOSIS — F332 Major depressive disorder, recurrent severe without psychotic features: Secondary | ICD-10-CM | POA: Diagnosis not present

## 2023-07-30 DIAGNOSIS — F332 Major depressive disorder, recurrent severe without psychotic features: Secondary | ICD-10-CM | POA: Diagnosis not present

## 2023-08-01 ENCOUNTER — Ambulatory Visit: Admitting: Psychiatry

## 2023-08-01 ENCOUNTER — Encounter: Payer: Self-pay | Admitting: Psychiatry

## 2023-08-01 DIAGNOSIS — G4733 Obstructive sleep apnea (adult) (pediatric): Secondary | ICD-10-CM | POA: Diagnosis not present

## 2023-08-01 DIAGNOSIS — F339 Major depressive disorder, recurrent, unspecified: Secondary | ICD-10-CM | POA: Diagnosis not present

## 2023-08-01 DIAGNOSIS — G4710 Hypersomnia, unspecified: Secondary | ICD-10-CM | POA: Diagnosis not present

## 2023-08-01 DIAGNOSIS — F411 Generalized anxiety disorder: Secondary | ICD-10-CM | POA: Diagnosis not present

## 2023-08-01 DIAGNOSIS — Z79899 Other long term (current) drug therapy: Secondary | ICD-10-CM

## 2023-08-01 DIAGNOSIS — R7989 Other specified abnormal findings of blood chemistry: Secondary | ICD-10-CM

## 2023-08-01 DIAGNOSIS — R52 Pain, unspecified: Secondary | ICD-10-CM

## 2023-08-01 DIAGNOSIS — G251 Drug-induced tremor: Secondary | ICD-10-CM

## 2023-08-01 MED ORDER — AUVELITY 45-105 MG PO TBCR: 1.0000 | EXTENDED_RELEASE_TABLET | Freq: Two times a day (BID) | ORAL | 1 refills | Status: DC

## 2023-08-01 MED ORDER — PROPRANOLOL HCL ER 120 MG PO CP24
120.0000 mg | ORAL_CAPSULE | Freq: Every day | ORAL | 1 refills | Status: DC
Start: 2023-08-01 — End: 2023-09-12

## 2023-08-01 NOTE — Progress Notes (Signed)
 Deborah Soto [id number] [birth year] 58 y.o.     Subjective:   Patient ID:  Deborah Soto is a 58 y.o. (DOB [birth year]) female.  Chief Complaint:  Chief Complaint  Patient presents with   Follow-up   Depression   Anxiety   Medication Problem   Depression        Associated symptoms include fatigue and myalgias.  Associated symptoms include no decreased concentration.   Deborah Soto presents to the office today for follow-up of TRD and anxiety, and insomnia.  seen December 08, 2018.  She was complaining of lithium  tremor and some cognitive problems.  Hydroxyzine  was stopped.  Propranolol  increased to 60 mg twice daily for tremor.  Lithium  level and amitriptyline  levels were requested. Amitriptyline  level to 37, nortriptyline  49 on amitriptyline  250 mg nightly. Lithium  level 0.800 mg daily  Phone call on October 6 to discuss lab results as follows: Note   ----- Message from Jonda Neighbours., MD sent at 12/15/2018 10:08 AM EDT ----- Lithium  level stable at 0.8 in a good range.  The previous was 0.7.  She is having some tremor issues.   Normal BMP including excellent creatinine and calcium  levels.  Blood sugar is high but she is aware of that problem.   TSH is within normal limits.   Serum amitriptyline  level is 286 which is at the upper end of the normal range and suggestive that we not try further increase.  We will discuss further options at her follow-up appointment.   No medication adjustments required, but because of her tremor and her lithium  level is slightly higher than it was with the prior level if she wants to reduce the lithium  from 3 of the 300 mg tablets daily to 2-1/2 of the 300 mg tablets daily she can do so.  At her last appointment she was also encouraged to try a higher dose of propranolol  and that may have resolved the problem.  If it did do not reduce the lithium  but if it did not and she wants to reduce the dose she can do so as noted.  She should call  us  if she has any recurrence of symptoms.   Deborah Beat MD, [name]     She had repeat lithium  level at 750 mg daily of 0.  7 with some improvement in tremor.  In phone call on November 20 she reported mood was worse with weepiness, overreacting, "pity party".  Because mood symptoms were worse with the reduction lithium  she was encouraged to increase the dosage back to 900 mg daily.   Last seen February 04, 2019.  The following changes were made: Increase propranolol  to 80 mg twice daily for lithium  tremor and anxiety her blood pressure and pulse appear high enough that she can tolerate this dosage.  Disc Se.  Disc risk with low BP and DM.  He has not been having any problems with low blood sugar. Amitriptyline  6 or 300 mg daily if tolerated.  Try to take some about 4 -5 hours before sleep Try to take lithium  also 4-5 hours before sleep to minimize daytime tremor Add methylphenidate  ER 27 mg in the morning.  If no response we will increase the dosage.  Seen May 08, 2019 with her husband. Aside from physically feeling like she can't do much then having memory issues.  Forgetful.  Loses track of thoughts between tasks.  Most embarrassing being around people and word-finding issues.   Problems with walking bc balance and weakness.  Fears  falling. Only fall at Xmas morning.  Tremor is awful.  Affects keying.   H notices cognitive problems and forgetfulness.    Shaking got really bad and got lithium  level as low as 600 mg but the next day felt really helpless and everything seemed to be aimed at hurting me and felt disrespected.  Problems with boss and 58 yo taking 5 AP classes and 2 volleyball classes.  I've been on her to accomplish all these things.  She claimed that pt screaming at her.   H CO she was more irritable after the reduction in lithium .   H CO her anxiety also seems worse.  H says the last year has been unhealthy with 12 hour work days for her.   Doesn't go out much.    Attention  problems more noticeable.  Still dropping and tremor.  Tolerated the increase in propranolol .    CO shakiness from lithium  which interferes with typing. Also a good bit of anxiety generally without panic.  BS are high.  NotTaking hydroxyzine .   sleeping better and still using CPAP.  Using CPAP regularly. Less awakening than before.  No SE. depression was worse after reducing the lithium .  Irritability was also worse after reducing the lithium  to 600 mg daily.  seen May 08, 2019.  Multiple changes made:  Reduce propranolol  to 60 mg twice daily for lithium  tremor and anxiety since the increase was not helpful.   Restart B6 for lithium  tremor. 500 mg BID. She forgot and doesn't want more. Try to take lithium  also 4-5 hours before sleep to minimize daytime tremor. depression was worse after reducing the lithium .  Irritability was also worse after reducing the lithium  to 600 mg daily. So reluctant to reduce it further.  It is unclear how to address the benefits of lithium  without using lithium  other than to consider Spravato  Increase methylphenidate  ER 36 mg in the morning. Increase methylphenidate  ER 36 mg in the morning.  Wait 1-2 weeks then reduce amitriptyline  to 5 tablets nightly to see if cognition is better. Also saw Dr. Sinda Duel re: sleep disorder.  05/2019 appt with the following noted: Lost Rx of Concerta  36 so didn't take it long.  Out of it.  Still shakey and cognitive problems.  Dep 5/10/  Anxiety 6/10 worse at night.  No SI.  8 hours sleep.  Mild panic couple times daily. Frustrated with tremor and sleepiness which is worse than tiredness.   History of low vitamin D  taking 150K/week but stopped 6 mos ago. Plan : Modafinil  100 mg tablet 1 dailly for 1 week, Then add Concerta  36 mg 1 each AM.  Option increase modafinil  mid April  Restart B6 for lithium  tremor. 500 mg BID. Get lab test at earliest convience  10/14/2019 appointment with the following noted: Has been on amitriptyline   250 mg for mos.  Tried a week ago reducing to 200 and felt worse so back on 250. Weaned off Lyrica  about a month ago.  Not much change off it.   Not sure if it helped energy or alertness. Saw Dr. Elfreda Grip in consultation. Mental clarity is a lot better with stimulants and not sleeping as much in daytime with current meds.  Severe drowsiness is a lot better.   Off metformin  and added Glipizide.   Not great with depression 5/10.  Anxiety can trigger problems with thinking and somatic sx   I feel like it's something physical and can keep her up at night. Occ racing heart  or feeling hot.  Caffeine variable.  Plan:Plan: Increase methylphenidate  from 36 to 54 mg in the morning. Increase amitriptyline  to 6 tablets in the evening    11/12/19 TC noting call: Pt called stated she's not feeling well at all. Ask for sooner apt than 10/7. Depression, anxiety & focus is much worse. Has not planned to hurt herself but thoughts of things would be better if I was not here have crossed her mind often. Contact ASAP @ [phone].  I put Pt on canc list  MD response: I reviewed the meds and previous med lists.  The next options are either Spravato or a major med change from amitriptyline  which is not something I can do outside of an appt.  She's on cancellation list and we'll try to work her in sooner.  Nothing I can change right now except since the increase in amitriptyline  has not helped, she can reduce it to 200 mg nightly in preparation for other changes to come. Nursing response after talking with pt: Discussed symptoms and medications with patient, she did not increase her Amitriptyline  50 mg up to 6 tablets so she will start that today. Said you may have told her that but she didn't remember. She has read about Spravato but she is not wanting to proceed with that. Informed her she's on cancellation list as well. She will call back with worsening signs or symptoms.  11/23/19 appt with the following noted: Forgot to  increase amitriptyline  until noted. Tolerating it OK but hasn't had time to help. More alert with Concerta  increase to 54 mg daily also with modafinil .  A little more focused and better productivity.  Still forgetful.   No SE noted except GI issues from diarrhea to constipation.   Still miserable and cry easily.  Try to numb herself.  Always tired especially emotionally.  Anxiety is high and predates current stimulants. No therapy since [facility].  She felt he pushed too hard for her to make changes.  I don't feel like I can deal with him right now. Plan: Continue meds & The increase in amitriptyline  to 300 mg daily needs more time.  01/12/2020 appointment with the following noted: Not well.  Mood horrible.  Easily frustrated sometimes without reason and may nap or cry.  Anxiety is pretty bad.  Daytime sleepiness seems worse.  Can get nausea from anxiety.  Feels like function is slow.  Have to write everything down.  Hates home, job, high school volleyball program.  At some point I have to take responsibility.   Amitriptyline  also not helping pain any.  Clenching jaw for 3 weeks. Likcking lips.  Picking fingers. D will play volleyball at [employer]  and [employer]. Plan: Reduce amitriptyline  by 1 tablet every 4 days. Start Viibryd  10 mg daily with food for 7 days, then 20 mg daily for 7 days, then increase to 40 mg daily.  03/18/20 appt urgently made and noted:  Seen with husband. Still with jaw clenching day and night despite off stimulant. Now on Viibryd  60 mg since 03/02/20 and no stimulant., lithium  900 mg, and propranolol  80 BID. Rare hydroxyzine  DT sleepiness, Gabapentin  300 TID. Not taking it with food.  LT sensitivity to noise and light and overstimulated and now bothering her more at work.  Always had to use shirts without tags and sensitivity to smells. Easily agitated in public per H.  Gets negative and wants to give up and not live anymore.  She says there's less chance of acting on  it if  she tells H about it and she's doing so now.  Pain worse off amitriptyline  esp around ribs and general diffuse pain.  Anxiety worse with stimulants and hasn't gone away.  Trouble staying asleep. Plan: Need to take Viibryd  60 with food emphasized.  She's not currently doing so. Restart low-dose amitriptyline  for pain  04/22/2020 appointment with following noted: Only restarted amitriptyline  50 mg last week.  Stays asleep better with it.  Sleep 8-9 hours now but no change in pain yet.  Sleepier in day recently. Done a lot of travel lately. Maybe viibryd  making a little difference with mood and anxiety now that has been on it longer.  Needs to take Viibryd  in AM bc seems to interfere with sleep. Final season of travel volleyball with good team. H notes still some irritability. Pain worse off amitriptyline .  Can reduce to  amitriptyline  25 for pain if 50 mg remains too sedating.  Is sleeping better.      06/21/2020 appointment with following noted:  Seen with H Tolerating amitriptyline  50 mg HS with decent sleep. Really crappy and as bad as 10 years.  Situationally life sucks.  Work environment is bad with coworkers.  Since Sept work is bad.  Doesn't feel people are supportive at home or work. H working 12 hour days.  When she's off wants to sleep all day.   Still in therapy with Larinda Plover.  Is good therapist. 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  Check lithium  level Referred to Dr. Deborra Falter for Spravato  11/03/2020 appointment with the following noted: 58 yo D got Covid but is OK. Still doing Spravato 84 mg twice weekly and plans to continue for awhile. And for a total of 3 mos.  Tolerated well.  It's a nice feeling.   Didn't seem to help the depression for a couple of months.   It does feel different. SI has resolved lately which is particularly impressive  given she has a lot of free time. She has lot s of time with nothing to do bc quit her job. Job environment got worse and worse.  Had been there 9 years.   Doing PT for pain in her back.  Ongoing physical issues.   Mood clearly better with Spravato but not resolved.   Neck fusion recently. Plan: check lithium  level Reduce olanzapine  to 5 mg HS  12/30/2020 appointment with the following noted: Surges of anxious feelings in her chest without heart racing several times daily last 15 mins and without pcpt.  Stay a little on edge all the time.   Left job and too much time on her hands. Youngest at [employer]  and [employer] in new position.  She's doing well statistically.  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 Spravato twice weekly.  Is better with it but still some mild cycling of depression.  Still upset over leaving work.  SI very rare and fleeting.  Had a really good plan but doesn't dwell on the plan. Lithium  level 0.7 on 900 mg HS. Reduced olanzapine  to 5 mg HS and has felt a little worse. Viibryd  60 mg daily. Amitrptyline 50 for pain.  Not sure if it helps.  PT helped a lot with tension in neck and down spine. Sleeping pretty well after 8 hours. Plan increase olanzapine  back to 10 mg nightly for treatment resistant depression and anxiety.  Anxiety was worse after reducing it and no symptoms were improved after reducing it. Try to stop amitriptyline  because it is probably not needed in the presence of olanzapine . Check lithium  level  03/14/2021 appointment noted: Increase olanzapine  to 10 mg for anxiety spells that are unexpected and hit her in the chest esp in the morning. Very low right now.  Twice weekly Spravato and felt good for awhile.  Maybe a slump after Xmas.  Not much social interaction since she quit her job.  Xmas gave her focus.  Doesn't have this now.   Mark working 14-16 hours daily.  This won't get better. Kids are doing great but not around Sleep well generally and that is a benefit  of olanzapine . Tolerates it well.   Plan: Trial increase olanzapine  to 15 mg HS  05/03/2021 appointment with the following noted: Still getting Spravato twice weekly.   I love it.  Totally relaxed Depression and anxiety SI is gone or fleeting with Spravato. Still in therapy with Oleh Berliner and thinks a lot of stuff go back to childhood.  Was always criticized for normal childhood mistakes.  So coped by overcompensating with going big and grand. Can't get energy up to do anything. Never felt she had a manic periods except overstimulated CC anxious feeling in body tingling and tight uncomfortable feeling.  Reminds her of stimulant effects but less intense and worse in AM. Everything still seems to hard like going to grocery store and worry over messing up cooking. Watches too much TV For a little while increase olanzapine  seemed to help.  Tolerating it. Depression is not that bad compared to the past. Plan: no med changes  07/17/21 appt noted:  seen with H Depression with twice weekly Spravato. But a lot of general anxiety and then spells of getting tingly and nervous.  Not triggers.   Using propranolol  BID. Anxiety is continuation of the way it was before.  Doesn't seem to vary with time of day.  Awakens with anxiety. No SE noted except sleepy.  Sleeping at least 10-11 hours.  Not obviously worse with increase olanzapine .  Viibryd  in AM bc insomnia otherwise. Balance trouble with hunched walk and she feels like legs are waker than they should be. Plan: Stop regular propranolol  and start ER propranolol  ER 1 at night for anxiety Reduce clonidine  to 1/2 tablet twice daily Wait 1 week and reduce olanzapine  to 1/2 tablet nightly to see if energy, alertness and balance are better. Starting June 1 stop clonidine .  09/05/21 appt noted: Still low energy.  No changes with less meds. Anxiety a little better last week for unclear reasons. She feels olanzapine  helps sleep and doesn't want to stop  it.  Sleep a lot.  11-11 if she can. Therapist working with her on childhood trauma. Long period of depression. Plan: Start Auvelity  1 daily for a week then 1 twice daily  11/27/21 appt noted:  Couple of phone calls and tremor problems and needed to reduce lithium  to 1 and 1/2 tablets and it helped reduce the tremor. Maybe a little improvement.  Not so depressed but kind of numb.   Spells of anxiety but a little better.  Can feel it in chest and arms at times every other night.   Continues spravato twice weekly.   Sleep is great. Off clonidne.  Contiues Aubelity twice daily, lithium  675 mg daily, olanzapine  5 mg HS, viibryd  60 mg daily. Sleep from 11-9 and sleeps well. Plan: Trial reduction in olanzapine  to  2.5 mg nightly.  02/14/22 appt noted: Noticed FM got better in summer and then relapsed.  Balance worth with FM also.   Did reduce Spravato from twice weekly to weekly in October.  Did it twice weekly for a year now.  Thinks maybe that is why FM flared.  Continues to weekly. No SI in over a years.  Better dresssed and using makeup now.  Wasn't for about 5 years.. Getting closer to feeling normal .  Depression is still good. Reduced and increased gabapentin  to 900 BID. No other med changes except baclofen  prn which is marginally helpful. Feels better and got motivated to buy a car.  She's more active and less depressed than she has been.  Gets showered and dress daily before leaving [place]. Less numb emotionally than when last here.  Some excitement. Asks about coming off the Auvelity  to reduce meds. A1C is 5.   Sleep is better and using CPAP Plan: no med change.  Continue Auvelity  1 twice daily, continue lithium  CR 450 mg tablets 2 nightly, continue olanzapine  2.5 mg nightly, continue propranolol  ER 160 mg daily, continue Viibryd  60 mg daily.  05/16/22 appt noted: Meds as above.  Note she was prescribed Mounjaro . switched from gabapentin  to Lyrica  150 mg twice daily,  Missed Auvelity  7  days and felt better when restarted it and I think it helps. Spravato twice weekly early Jan hoping to help pain but didn't. Still dealing with chronic pain esp around abd but also general aches.  Lyrica  has helped some No SE Doing well other than pain.  Had a dip in mood late DEC into early Feb without SI but not interested but better now. Interest in cookie decorating and to make for Maggie's wedding in next year. Plan no changes  08/01/22 appt noted: Psych med: Auvelity  BID, lithium  CR 675 mg nightly, olanzapine  2.5 mg nightly, propranolol  ER 160 mg daily, Viibryd  60 mg daily, vitamin D  50000 units weekly.  Also on pregabalin  150 mg every morning and 300 mg nightly for fibromyalgia pain.  Spravato twice weekly. Doing pretty well and better than usual.   Still in therapy with [facility].  Dealt with feelings worthlessness out of childhood.  Parents were very critical.  Has let a lot of this go. This is best I've felt in a long time.   Tolerating meds. New hobby is baking.   Going to see M in [place] in July which could be triggering.  Sister is there too and much like her. Sister can be critical too.  Her kids are going too.   Had a nice mother's Day affirming.  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   Sleep is good. Plan no changes  10/03/22 appt noted: Psych med: Auvelity  BID, lithium  CR 675 mg nightly, olanzapine  2.5 mg nightly, propranolol  ER 160 mg daily, Viibryd  60 mg daily, vitamin D  50000 units weekly.  Also on pregabalin  150 mg every morning and 300 mg nightly for fibromyalgia pain.  Spravato twice weekly. Hesitant to try cutting back to once weekly Spravato bc upcoming family vacation. Doing therapy .  Father is passed.  M can be dismissive and critical.  Wrote a letter letting go of the past.   M on O2.  Family in [place].   Better energy.  Big family gathering this next week.   Dealing with DM unstable craving.   Plan no med changes  01/03/23 appt  noted; No med changes, continue: Auvelity  BID,  lithium  CR 675 mg nightly, olanzapine  2.5 mg nightly, propranolol  ER 160 mg daily, Viibryd  60 mg daily, vitamin D  50000 units weekly.  Also on pregabalin  150 mg every morning and 300 mg nightly for fibromyalgia pain.  Spravato twice weekly. Would like to cut Spravato to once weekly bc has things to do.  Involved in campaigns.  Passionate about it.   Mood is much better.  Doing really well overall and seeing Larinda Plover every other week.   Made big progress and feels like a blanket taken off.  Write a lot of therapeutic letters.  Gotten very busy since then.  Will start baking again and [employer] business.  Enjoying the research.   Involved in early voting outreach.  D's teachers. Couple of times forgetting night time meds.  Poor sleep if forgets night meds.   Having fun in life.    03/25/23 appt noted: No med changes, continue: Auvelity  BID, lithium  CR 675 mg nightly, olanzapine  2.5 mg nightly, propranolol  ER 160 mg daily, Viibryd  60 mg daily, vitamin D  50000 units weekly.  Also on pregabalin  150 mg every morning and 300 mg nightly for fibromyalgia pain.  Spravato twice weekly. Still getting Spravato went to once weekly. Been ok.  Some of the excitmemt has leveled out from last viist. DT holdidays was spread out more than usual.  Don't like the holidays.  Could have been holidays.   Seeing therapist Larinda Plover every other week.  Not really down now.  Even on low days nothing like before.   Wants to wean off lithium  bc no longer suicidal.   Has lots of ideas but would like more energy.     Depression started after children born.      05/23/23 appt noted: Med: Auvelity  BID, reduced and increased lithium  CR 675 mg nightly, olanzapine  2.5 mg nightly, propranolol  ER 160 mg daily, Viibryd  60 mg daily,  vitamin D  50000 units weekly.  Also reducing pregabalin  50 mg every morning and 50 mg nightly for fibromyalgia pain.  Spravato once weekly. No SE  D getting married next  month.   Working on garden.  Started [employer] company to [employer]. Doing some PT work early Jan 8 hours / week which has been good so far.  Busy .   Generally mood wise happy but not sleepy that well.  Wonders about incr olanzapine .  More bad dreams 50% of time and some NM.  Sleep worse in the past month.   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  Had a rage reaction after reducing lithium  to less lithium  and better with incr back to 675 mg . Feel so much better and more outgoing.  Than before.  Pleased with Spravato.  Empty nester's Not having much FM pain.   DM is controlled.  Still trouble with wt.  Not binge eating.   Plan: Done well with reduced olanzapine  to 2.5 mg HS for TRD until lately worsening sleep, temporarily increase for sleep to 5 mg HS  08/01/23 appt noted: Med: Auvelity  BID, reduced back lithium  CR 450 mg nightly, olanzapine  5 mg nightly, propranolol  ER 160 mg daily, Viibryd  60 mg daily,  vitamin D  50000 units weekly.  Stopped Lyrica  for fibromyalgia pain and it's worse.  Spravato once weekly.  On Mounjaro .   No SE D wedding went well.   Ok other than pain worse.  Maybe worse from stopping Lyrica .  Stopped it bc didn't think it was helping but pain is worse. Lost to 200# and now 210#.   Still  have a sweet tooth.  Doing Hungry root.   Sleep is better with olanzapine . But brief awakening.   Fair bit of anxiety which will hit in chest.   Some chronic tiredness.   Doing meditation for women going through menopause to lower stress hormones.    ECT-MADRS    Flowsheet Row Office Visit from 05/03/2021 in [facility]  MADRS Total Score 32      GAD-7    Flowsheet Row Office Visit from 02/13/2023 in [facility] [place]  Total GAD-7 Score 7      PHQ2-9    Flowsheet Row Office Visit from 05/15/2023 in [facility] [place] Office Visit from 04[facility]16/2023 in [facility] [employer] Visit from  02/13/2023 in [facility] [employer] from 10/31/2022 in [facility] [employer] from 08/14/2022 in [facility] [place]  PHQ-2 Total Score 0 0 1 0 0  PHQ-9 Total Score 0 0 3 0 0      Flowsheet Row Admission (Discharged) from 08/17/2020 in [facility]  [facility] [facility] Pre-Admission Testing 62 from 08/16/2020 in [facility] PREADMISSION TESTING  C-SSRS RISK CATEGORY No Risk No Risk       Maggie 78 and dating.  Past Psychiatric Medication Trials:  Failed ECT and TMS,  Spravato started 07/2020 amitriptyline  helped for 2 to 3 years in 1987,   2021 No benefit from amitriptyline  300 mg with adequate duration.  Nortriptyline  200 with a therapeutic blood level. Trintellix, duloxetine  120, Lexapro  20, Fetzima,  Wellbutrin, fluoxetine, paroxetine, sertraline,  (Poor response to SSRIs) Viibryd  60 Emsam,   lithium  SE,  Olanzapine  2.5 ins, 5 mg sleep better Latuda 120, Abilify 15,  Vraylar, Rexulti ,Seroquel 400, risperidone, Geodon,  Haldol for agitation, lamotrigine 300,  buspirone , clonazepam, gabapentin , Propranolol , clonidine  0.1 mg BID modafinil ,  Nuvigil , Vyvanse, Concerta  Pramipexole SE compulsive  SHE DOESN'T WANT STIMULANTS AGAIN bc of anxiety. Trazodone ,   GENESIGHT COMPLETED  history of overdose on Xanax, There is a history of suicide attempts and psychiatric hospitalizations.  New PCP good Valdene Garret   Review of Systems:  Review of Systems  Constitutional:  Positive for fatigue. unchanged Cardiovascular:  Negative for chest pain and palpitations.  Gastrointestinal:  Negative for diarrhea.  Musculoskeletal:  Positive for back pain and myalgias. Negative for gait problem.       Less pain lately.  Neurological:  Positive for mild tremors .  Negative for dizziness.  Psychiatric/Behavioral:  Positive for dysphoric mood. Negative for  confusion, decreased concentration, hallucinations and self-injury. The patient is nervous/anxious.     Medications: I have reviewed the patient's current medications.  Current Outpatient Medications  Medication Sig Dispense Refill   celecoxib  (CELEBREX ) 50 MG capsule Take 1 capsule (50 mg total) by mouth 2 (two) times daily. 60 capsule 5   Continuous Blood Gluc Receiver (DEXCOM G7 RECEIVER) DEVI Use 4 times daily to check blood sugar 1 each 11   Continuous Glucose Sensor (DEXCOM G7 SENSOR) MISC USE AS DIRECTED FOUR TIMES DAILY TO CHECK BLOOD SUGAR 3 each 0   Empagliflozin -metFORMIN  HCl ER (SYNJARDY  XR) 25-1000 MG TB24 Take 1 tablet by mouth daily.     Esketamine HCl, 84 MG Dose, (SPRAVATO, 84 MG DOSE,) 28 MG/DEVICE SOPK Place 84 mg into the nose See admin instructions. Use  weekly     Ginger, Zingiber officinalis, (GINGER PO) Take by mouth.     Insulin  Pen Needle (PEN NEEDLES) 33G X 4 MM MISC Use daily as needed to inject insulin  100 each 1   ipratropium (ATROVENT ) 0.03 % nasal spray SMARTSIG:2 Spray(s) Both Nares 3 Times Daily PRN     lidocaine  (LIDODERM ) 5 % Place 1 patch onto the skin every 12 (twelve) hours. Remove & Discard patch within 12 hours or as directed by MD 30 patch 2   lithium  carbonate (ESKALITH ) 450 MG ER tablet Take 1.5 tablets (675 mg total) by mouth daily in the afternoon. (Patient taking differently: Take 450 mg by mouth daily in the afternoon.) 135 tablet 0   MOUNJARO  15 MG/0.5ML Pen INJECT 0.5MLS SUBCUTANEOUSLY ONCE A WEEK 6 mL 11   OLANZapine  (ZYPREXA ) 5 MG tablet Take 1 tablet (5 mg total) by mouth at bedtime. 30 tablet 0   pantoprazole  (PROTONIX ) 40 MG tablet TAKE ONE TABLET BY MOUTH ONCE DAILY FOR STOMACH PROTECTION 30 tablet 5   SYNJARDY  XR 12.07-998 MG TB24 TAKE TWO TABLETS BY MOUTH DAILY 60 tablet 0   tretinoin (RETIN-A) 0.05 % cream SMARTSIG:Sparingly Topical Every Night     tretinoin (RETIN-A) 0.1 % cream nightly.     trimethoprim -polymyxin b  (POLYTRIM )  ophthalmic solution Place 2 drops into both eyes every 6 (six) hours. 10 mL 0   TURMERIC PO Take by mouth.     Vilazodone  HCl (VIIBRYD ) 40 MG TABS Take 1&1/2 tablets (60 mg total) by mouth daily. 135 tablet 0   Vitamin D , Ergocalciferol , (DRISDOL ) 1.25 MG (50000 UNIT) CAPS capsule TAKE ONE CAPSULE BY MOUTH EVERY 7 DAYS 15 capsule 1   XARELTO  20 MG TABS tablet TAKE ONE TABLET BY MOUTH DAILY WITH SUPPER 30 tablet 0   Dextromethorphan-buPROPion ER (AUVELITY ) 45-105 MG TBCR Take 1 tablet by mouth 2 (two) times daily. 180 tablet 1   pregabalin  (LYRICA ) 50 MG capsule Take 1 capsule (50 mg total) by mouth 2 (two) times daily. (Patient not taking: Reported on 08/01/2023) 60 capsule 5   propranolol  ER (INDERAL  LA) 120 MG 24 hr capsule Take 1 capsule (120 mg total) by mouth daily. 30 capsule 1   No current facility-administered medications for this visit.    Medication Side Effects: Sedation  Allergies:  Allergies  Allergen Reactions   Tramadol  Other (See Comments)    Tingling all over    Hydrocodone      Ineffective     Past Medical History:  Diagnosis Date   Anemia    Anxiety    Asthma    Depression    Diabetes mellitus type 2 in obese 06/11/2016   Dyspnea    Embolism - blood clot January 1997 & January 2017   Also had LLE DVT in 1997   History of kidney stones    Hypertension    Preeclampsia 10/10/2011   1999    Sleep apnea     Family History  Problem Relation Age of Onset   Dementia Father    Heart disease Father    Clotting disorder Mother    Arthritis Mother    Clotting disorder Sister    Clotting disorder Maternal Grandmother    Clotting disorder Maternal Aunt    Rheumatologic disease Neg Hx    Hyperparathyroidism Neg Hx     Social History   Socioeconomic History   Marital status: Married    Spouse name: Not on file   Number of children: Y  Years of education: Not on file   Highest education level: Not on file  Occupational History   Occupation: admin assist     Comment: insurance verification  Tobacco Use   Smoking status: Former    Current packs/day: 0.00    Average packs/day: 1 pack/day for 20.0 years (20.0 ttl pk-yrs)    Types: Cigarettes    Start date: 03/12/1984    Quit date: 03/12/2004    Years since quitting: 19.4    Passive exposure: Past   Smokeless tobacco: Never  Vaping Use   Vaping status: Never Used  Substance and Sexual Activity   Alcohol use: Yes    Alcohol/week: 1.0 standard drink of alcohol    Types: 1 Cans of beer per week    Comment: 1-2 times a week   Drug use: No   Sexual activity: Yes    Partners: Male    Birth control/protection: None  Other Topics Concern   Not on file  Social History Narrative   Originally from [place]. Always lived in [place] . 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 No international travel recently. Previously has been to [place] in May 1996. No mold exposure recently. No bird exposure. Does have multiple pets.    Social Drivers of Corporate investment banker Strain: Not on file  Food Insecurity: Not on file  Transportation Needs: Not on file  Physical Activity: Not on file  Stress: Not on file  Social Connections: Unknown (07/24/2021)   Received from [facility], [facility]   Social Network    Social Network: Not on file  Intimate Partner Violence: Unknown (06/15/2021)   Received from [facility], [facility]   HITS    Physically Hurt: Not on file    Insult or Talk Down To: Not on file    Threaten Physical Harm: Not on file    Scream or Curse: Not on file    Past Medical History, Surgical history, Social history, and Family history were reviewed and updated as appropriate.   Please see review of systems for further details on the patient's review from today.   Objective:   Physical Exam:  LMP  (LMP Unknown) Comment: last period ---  1 year ago (?)  Physical Exam Constitutional:      General: She is not in acute distress. Musculoskeletal:        General: No deformity.   Neurological:     Mental Status: She is alert and oriented to person, place, and time.     Cranial Nerves: No dysarthria.     Coordination: Coordination normal.  Psychiatric:        Attention and Perception: Attention and perception normal. She does not perceive auditory or visual hallucinations.        Mood and Affect: Mood is euthymic. Affect is not labile, blunt, angry, tearful or inappropriate.    Not as severe as in the past. Not irritable.    Speech: Speech normal.        Behavior: Behavior normal. Behavior is cooperative.        Thought Content: Thought content normal. Thought content is not paranoid or delusional. Thought content does not include homicidal or suicidal ideation. Thought content does not include suicidal plan.        Cognition and Memory: Cognition and memory normal.        Judgment: Judgment normal.     Comments: Insight intact    Lab Review:     Component Value Date/Time  NA 141 04/11/2022 1054   NA 138 08/21/2019 0000   NA 140 12/11/2016 1236   K 4.5 04/11/2022 1054   K 4.2 12/11/2016 1236   CL 107 04/11/2022 1054   CO2 24 04/11/2022 1054   CO2 24 12/11/2016 1236   GLUCOSE 126 (H) 04/11/2022 1054   GLUCOSE 109 12/11/2016 1236   BUN 14 04/11/2022 1054   BUN 12 08/21/2019 0000   BUN 15.1 12/11/2016 1236   CREATININE 0.70 04/11/2022 1054   CREATININE 0.68 02/22/2020 1649   CREATININE 0.8 12/11/2016 1236   CALCIUM  9.9 04/11/2022 1054   CALCIUM  10.0 12/11/2016 1236   PROT 7.0 04/11/2022 1054   PROT 7.5 12/11/2016 1236   ALBUMIN 4.9 04/11/2022 1054   ALBUMIN 4.4 12/11/2016 1236   AST 30 04/11/2022 1054   AST 38 (H) 12/11/2016 1236   ALT 36 (H) 04/11/2022 1054   ALT 61 (H) 12/11/2016 1236   ALKPHOS 55 04/11/2022 1054   ALKPHOS 71 12/11/2016 1236   BILITOT 0.5 04/11/2022 1054   BILITOT 0.62 12/11/2016 1236   GFRNONAA >60 08/16/2020 1157   GFRNONAA 105 12/11/2018 1121   GFRAA 122 12/11/2018 1121       Component Value Date/Time   WBC 8.6  04/11/2022 1054   RBC 5.01 04/11/2022 1054   HGB 15.0 04/11/2022 1054   HGB 14.5 12/11/2016 1236   HCT 45.2 04/11/2022 1054   HCT 43.9 12/11/2016 1236   PLT 280.0 04/11/2022 1054   PLT 289 12/11/2016 1236   PLT 295 07/24/2016 1848   MCV 90.1 04/11/2022 1054   MCV 93.2 12/11/2016 1236   MCH 29.9 08/16/2020 1157   MCHC 33.1 04/11/2022 1054   RDW 14.5 04/11/2022 1054   RDW 13.5 12/11/2016 1236   LYMPHSABS 2.2 11/22/2020 1223   LYMPHSABS 2.4 12/11/2016 1236   MONOABS 0.6 11/22/2020 1223   MONOABS 0.7 12/11/2016 1236   EOSABS 0.1 11/22/2020 1223   EOSABS 0.7 (H) 12/11/2016 1236   BASOSABS 0.0 11/22/2020 1223   BASOSABS 0.0 12/11/2016 1236    Lithium  Lvl  Date Value Ref Range Status  11/22/2020 0.7 0.6 - 1.2 mmol/L Final   11/22/20  lithium  0.7 on 900   Serum nortriptyline  level on 150 mg a day was 109  Amitriptyline   Level 12/11/2018 =237  On 250 mg daily,.  12/23/2020 vitamin D  level 31.38, vitamin B12 519  No results found for: "PHENYTOIN", "PHENOBARB", "VALPROATE", "CBMZ"   .res Assessment: Plan:    Recurrent major depression resistant to treatment (HCC) - Plan: Dextromethorphan-buPROPion ER (AUVELITY ) 45-105 MG TBCR  Generalized anxiety disorder - Plan: propranolol  ER (INDERAL  LA) 120 MG 24 hr capsule  Hypersomnolence  Obstructive sleep apnea  Diffuse pain  Lithium -induced tremor  Low vitamin D  level  Lithium  use   Reeda has chronic severe major depression and generalized anxiety that is treatment resistant and failed multiple medications ECT and TMS as noted above including SSRIs, try cyclic's, lithium , and atypical antipsychotics for depression.. She has had partial benefit from amitriptyline  plus lithium .  However  tremor problems from the lithium .  Efforts to reduce the lithium  have led to worsening psychiatric symptoms.  There has been a discussion about possible bipolar elements but she has no clear manic episodes unrelated to medication changes.  She  has features of atypical depression.  So an MAO inhibitor is attractive from that perspective. Not sig depressed and numbness better.  Good response.  Doing well then.    Discussed Spravato option in detail.  She is continuing Spravato.  Had Done well with reduced olanzapine  to 2.5 mg HS for TRD until worsening sleep, and needed increase for sleep to 5 mg HS For anxiety and depression.  Consider switch and retry pramipexole and push dose. Less numb  Statistically speaking the most potentially beneficial option would be Parnate or Nardil but they are difficult to use   Need to take Viibryd  60 with food emphasized.   Worse impulisivity with reduced lithium  at 300.   Had a rage reaction after reducing lithium  to less lithium  and better with incr back to  450 MG.  No SE difference.    No tremor px.  On propranolol .   continue Auvelity  1 twice daily BC partial benefit Disc SE.   Her improvement seems to correlate with starting this.  continu counseling.  Good benefit seeing Nell Bamberger.. Counseling 20 min: Great progress dealing with childhood issues.  Helped current perspective.  Read letter in office written to parents, letting go and forgiveness themes. Encourage physical activity and weight loss.  She is not particularly motivated and feels that is hard to accomplish because of chronic pain.  Chronic pain complicates her treatment of depression.   Lyrica  helps pain.  Recent B12 deficiency dx and treatment started. vitamin D  bc level 31 on 2000U daily  Hold B6 for lithium  tremor. 500 mg BID. If needed.  Tremor is better.  None currently.  Try to take lithium  also 4-5 hours before sleep to minimize daytime tremor. depression was worse after reducing the lithium .  Irritability was also worse after reducing the lithium  to 600 mg daily.   Disc weith loss meds Monjarou recently started for DM and goals.  Last checked lost 17#.  BS is OK.  Has continuous glucose monitor. More benefit with  Monjarou than semaglutide .  Take vitamin D  regularly.  continue: Auvelity  BID,  Continue lithium  450 mg HS  olanzapine  5 mg nightly,  Reduce propranolol  ER 120 mg daily to help energy and bc no tremor.  Viibryd  60 mg daily, vitamin D  50000 units weekly.    Spravato twice weekly.  Follow-up 8 weeks  Deborah Beat MD, [name]  Future Appointments  Date Time Provider Department Center  08/16/2023  9:00 AM Rodney Clamp, MD [facility]     No orders of the defined types were placed in this encounter.      -------------------------------

## 2023-08-02 ENCOUNTER — Telehealth: Payer: Self-pay | Admitting: Psychiatry

## 2023-08-02 NOTE — Telephone Encounter (Signed)
 Nicki at [facility] @ 1:17p stating that the PA for Auvelity  was approved.  They said they have contacted the pt.  Next appt 7/24

## 2023-08-06 DIAGNOSIS — F332 Major depressive disorder, recurrent severe without psychotic features: Secondary | ICD-10-CM | POA: Diagnosis not present

## 2023-08-07 ENCOUNTER — Other Ambulatory Visit: Payer: Self-pay

## 2023-08-07 ENCOUNTER — Ambulatory Visit (INDEPENDENT_AMBULATORY_CARE_PROVIDER_SITE_OTHER)

## 2023-08-07 ENCOUNTER — Ambulatory Visit: Payer: Self-pay | Admitting: Family Medicine

## 2023-08-07 VITALS — BP 112/78 | HR 87 | Ht 68.0 in

## 2023-08-07 DIAGNOSIS — Z981 Arthrodesis status: Secondary | ICD-10-CM | POA: Diagnosis not present

## 2023-08-07 DIAGNOSIS — M4722 Other spondylosis with radiculopathy, cervical region: Secondary | ICD-10-CM | POA: Diagnosis not present

## 2023-08-07 DIAGNOSIS — M5010 Cervical disc disorder with radiculopathy, unspecified cervical region: Secondary | ICD-10-CM | POA: Diagnosis not present

## 2023-08-07 DIAGNOSIS — M25551 Pain in right hip: Secondary | ICD-10-CM

## 2023-08-07 NOTE — Patient Instructions (Addendum)
 Thank you for coming in today.   You received an injection today. Seek immediate medical attention if the joint becomes red, extremely painful, or is oozing fluid.   Please get an Xray today before you leave   Please call [facility]) at [phone] to schedule your spine injection.

## 2023-08-07 NOTE — Progress Notes (Signed)
 Joanna Muck, PhD, LAT, ATC acting as a scribe for Garlan Juniper, MD.  Deborah Soto is a 58 y.o. female who presents to [facility] today for cont'd R hip pain. Pt was last seen by Dr. Alease Hunter on 06/14/23. Last R GT steroid injection, 04/10/23.  Today, pt reports her daughter's wedding went fantastic.  Pt c/o R hip pain worsened progressively over the last 2-wks. Pt locates pain to the lateral aspect of her R hip.  She also c/o R side periscapular pain. She described it like an "electrical shock." Pain will radiated through her R arm, lateral/ulnar aspect. Hx of cervical spinal fusion in 2022.  Dx imaging: 07/19/22 R hip XR             05/04/20 L-spine MRI             03/14/20 L-spine XR  Pertinent review of systems: No fevers or chills  Relevant historical information: Pulmonary embolism and diabetes.  History of cervical fusion.   Exam:  BP 112/78   Pulse 87   Ht 5\' 8"  (1.727 m)   LMP  (LMP Unknown) Comment: last period ---  1 year ago (?)  SpO2 98%   BMI 32.99 kg/m  General: Well Developed, well nourished, and in no acute distress.   MSK: C-Spine: Normal appearing Nontender palpation spinal midline. Decree cervical motion. Upper extremity strength is intact. Reflexes are intact.  Right hip: Normal-appearing Tender palpation greater trochanter.  Hip abduction and external rotation strength are reduced.  Hip range of motion is intact.    Lab and Radiology Results  Procedure: Real-time Ultrasound Guided Injection of right lateral hip greater trochanter bursa Device: Philips Affiniti 50G/GE Logiq Images permanently stored and available for review in PACS Verbal informed consent obtained.  Discussed risks and benefits of procedure. Warned about infection, bleeding, hyperglycemia damage to structures among others. Patient expresses understanding and agreement Time-out conducted.   Noted no overlying erythema, induration, or other signs of  local infection.   Skin prepped in a sterile fashion.   Local anesthesia: Topical Ethyl chloride.   With sterile technique and under real time ultrasound guidance: 40 mg of Kenalog  and 2 mL of Marcaine  injected into greater trochanter bursa. Fluid seen entering the bursa.   Completed without difficulty   Pain immediately resolved suggesting accurate placement of the medication.   Advised to call if fevers/chills, erythema, induration, drainage, or persistent bleeding.   Images permanently stored and available for review in the ultrasound unit.  Impression: Technically successful ultrasound guided injection.    X-ray images cervical spine obtained today personally and independently interpreted. AICD fusion from C3-C6.  Degenerative changes C6-7. Await formal radiology review     Assessment and Plan: 58 y.o. female with right arm pain due to cervical radiculopathy in a C 8 dermatomal pattern on the right.  This is below the level of her fusion and is likely the next level to start having problems.  She does have some changes seen at this level on MRI from 2022.  Plan for epidural steroid injection.  If this injection does not work well would proceed with advanced imaging.  Given the amount of hardware that she has in her neck CT myelogram would be the next test of choice.  Additionally patient has continued right trochanteric bursitis related pain.  This is a recurrence of a chronic problem.  Plan for injection today.  Continue home exercise program and consider physical therapy.  PDMP not reviewed this encounter. Orders Placed This Encounter  Procedures   US  LIMITED JOINT SPACE STRUCTURES LOW RIGHT(NO LINKED CHARGES)    Reason for Exam (SYMPTOM  OR DIAGNOSIS REQUIRED):   right hip pain    Preferred imaging location?:   [facility] DIAG/THERA/INC NEEDLE/CATH/PLC EPI/CERV/THOR W/IMG    C8 radicular symptoms. Level and technique per radiology     Standing Status:   Future    Expiration Date:   09/07/2023    Reason for Exam (SYMPTOM  OR DIAGNOSIS REQUIRED):   cervical radiculopathy    Preferred Imaging Location?:   [facility]    Is the patient pregnant?:   No   DG Cervical Spine 2 or 3 views    Standing Status:   Future    Number of Occurrences:   1    Expiration Date:   08/06/2024    Reason for Exam (SYMPTOM  OR DIAGNOSIS REQUIRED):   cervical radiculopathy    Is patient pregnant?:   No    Preferred imaging location?:   [place] [facility]   No orders of the defined types were placed in this encounter.    Discussed warning signs or symptoms. Please see discharge instructions. Patient expresses understanding.   The above documentation has been reviewed and is accurate and complete Garlan Juniper, M.D.

## 2023-08-09 DIAGNOSIS — I10 Essential (primary) hypertension: Secondary | ICD-10-CM | POA: Diagnosis not present

## 2023-08-09 DIAGNOSIS — G4733 Obstructive sleep apnea (adult) (pediatric): Secondary | ICD-10-CM | POA: Diagnosis not present

## 2023-08-12 ENCOUNTER — Encounter: Payer: Self-pay | Admitting: Family Medicine

## 2023-08-12 NOTE — Telephone Encounter (Signed)
 Forwarding to Dr. Alease Hunter to make sure he is agreeable to plan.   Pt had hip injection 08/07/23.

## 2023-08-13 DIAGNOSIS — F332 Major depressive disorder, recurrent severe without psychotic features: Secondary | ICD-10-CM | POA: Diagnosis not present

## 2023-08-14 ENCOUNTER — Other Ambulatory Visit: Payer: Self-pay | Admitting: Family Medicine

## 2023-08-14 DIAGNOSIS — M797 Fibromyalgia: Secondary | ICD-10-CM

## 2023-08-15 DIAGNOSIS — F332 Major depressive disorder, recurrent severe without psychotic features: Secondary | ICD-10-CM | POA: Diagnosis not present

## 2023-08-16 ENCOUNTER — Ambulatory Visit: Payer: Self-pay | Admitting: Family Medicine

## 2023-08-16 ENCOUNTER — Encounter: Payer: Self-pay | Admitting: Family Medicine

## 2023-08-16 VITALS — BP 113/79 | HR 82 | Temp 97.2°F | Ht 68.0 in | Wt 212.6 lb

## 2023-08-16 DIAGNOSIS — E1169 Type 2 diabetes mellitus with other specified complication: Secondary | ICD-10-CM | POA: Diagnosis not present

## 2023-08-16 DIAGNOSIS — E559 Vitamin D deficiency, unspecified: Secondary | ICD-10-CM

## 2023-08-16 DIAGNOSIS — Z131 Encounter for screening for diabetes mellitus: Secondary | ICD-10-CM

## 2023-08-16 DIAGNOSIS — E538 Deficiency of other specified B group vitamins: Secondary | ICD-10-CM | POA: Diagnosis not present

## 2023-08-16 DIAGNOSIS — M797 Fibromyalgia: Secondary | ICD-10-CM

## 2023-08-16 DIAGNOSIS — K760 Fatty (change of) liver, not elsewhere classified: Secondary | ICD-10-CM

## 2023-08-16 DIAGNOSIS — Z7985 Long-term (current) use of injectable non-insulin antidiabetic drugs: Secondary | ICD-10-CM

## 2023-08-16 DIAGNOSIS — E669 Obesity, unspecified: Secondary | ICD-10-CM

## 2023-08-16 DIAGNOSIS — R52 Pain, unspecified: Secondary | ICD-10-CM

## 2023-08-16 LAB — COMPREHENSIVE METABOLIC PANEL WITH GFR
ALT: 19 U/L (ref 0–35)
AST: 17 U/L (ref 0–37)
Albumin: 4.6 g/dL (ref 3.5–5.2)
Alkaline Phosphatase: 43 U/L (ref 39–117)
BUN: 20 mg/dL (ref 6–23)
CO2: 27 meq/L (ref 19–32)
Calcium: 9.8 mg/dL (ref 8.4–10.5)
Chloride: 106 meq/L (ref 96–112)
Creatinine, Ser: 0.66 mg/dL (ref 0.40–1.20)
GFR: 98.54 mL/min (ref >60.00)
Glucose, Bld: 137 mg/dL — ABNORMAL HIGH (ref 70–99)
Potassium: 3.9 meq/L (ref 3.5–5.1)
Sodium: 140 meq/L (ref 135–145)
Total Bilirubin: 0.5 mg/dL (ref 0.2–1.2)
Total Protein: 6.8 g/dL (ref 6.0–8.3)

## 2023-08-16 LAB — LIPID PANEL
Cholesterol: 187 mg/dL (ref 0–200)
HDL: 59.5 mg/dL (ref >39.00)
LDL Cholesterol: 105 mg/dL — ABNORMAL HIGH (ref 0–99)
NonHDL: 127.64
Total CHOL/HDL Ratio: 3
Triglycerides: 111 mg/dL (ref 0.0–149.0)
VLDL: 22.2 mg/dL (ref 0.0–40.0)

## 2023-08-16 LAB — POCT GLYCOSYLATED HEMOGLOBIN (HGB A1C): Hemoglobin A1C: 5.7 % — AB (ref 4.0–5.6)

## 2023-08-16 LAB — CBC
HCT: 43.2 % (ref 36.0–46.0)
Hemoglobin: 14.3 g/dL (ref 12.0–15.0)
MCHC: 33.2 g/dL (ref 30.0–36.0)
MCV: 88 fl (ref 78.0–100.0)
Platelets: 235 10*3/uL (ref 150.0–400.0)
RBC: 4.91 Mil/uL (ref 3.87–5.11)
RDW: 15.2 % (ref 11.5–15.5)
WBC: 7.5 10*3/uL (ref 4.0–10.5)

## 2023-08-16 MED ORDER — CELECOXIB 50 MG PO CAPS: 50.0000 mg | ORAL_CAPSULE | Freq: Two times a day (BID) | ORAL | 5 refills | Status: DC

## 2023-08-16 NOTE — Assessment & Plan Note (Signed)
 Her weight has plateaued over the last year.  She is working on diet.  Exercise is difficult due to her joint pain though we did discuss potential low impact options to help her with this.  Hopefully she will be able to exercise more with treatment for her back and joint pain for sports medicine.

## 2023-08-16 NOTE — Progress Notes (Signed)
 X-ray cervical spine shows a fusion of C3-C6.  You do have a little bit of arthritis below it at C6-7.

## 2023-08-16 NOTE — Assessment & Plan Note (Signed)
 Check vitamin D.

## 2023-08-16 NOTE — Assessment & Plan Note (Signed)
 This was noted on imaging several years ago.  She has done a great job with diabetes management over the last couple of years.  Will recheck c-Met today.

## 2023-08-16 NOTE — Patient Instructions (Signed)
 It was very nice to see you today!  Your A1c looks great! We will check blood work today.  I will see you back in 6 months. Come back sooner if needed.   Return in about 6 months (around 02/15/2024) for Follow Up.   Take care, Dr Daneil Dunker  PLEASE NOTE:  If you had any lab tests, please let us  know if you have not heard back within a few days. You may see your results on mychart before we have a chance to review them but we will give you a call once they are reviewed by us .   If we ordered any referrals today, please let us  know if you have not heard from their office within the next week.   If you had any urgent prescriptions sent in today, please check with the pharmacy within an hour of our visit to make sure the prescription was transmitted appropriately.   Please try these tips to maintain a healthy lifestyle:  Eat at least 3 REAL meals and 1-2 snacks per day.  Aim for no more than 5 hours between eating.  If you eat breakfast, please do so within one hour of getting up.   Each meal should contain half fruits/vegetables, one quarter protein, and one quarter carbs (no bigger than a computer mouse)  Cut down on sweet beverages. This includes juice, soda, and sweet tea.   Drink at least 1 glass of water with each meal and aim for at least 8 glasses per day  Exercise at least 150 minutes every week.

## 2023-08-16 NOTE — Assessment & Plan Note (Signed)
 She was able to discontinue Lyrica  since her last visit.  Now on Celebrex  50 mg twice daily.  Overall feels like this is a reasonable dose for her.

## 2023-08-16 NOTE — Progress Notes (Signed)
   Deborah Soto is a 58 y.o. female who presents today for an office visit.  Assessment/Plan:  Chronic Problems Addressed Today: Type 2 diabetes mellitus with obesity (HCC) A1c very well-controlled 5.7.  She is working on lifestyle interventions.  Will continue Mounjaro  15 mg weekly and Synjardy  12.07-998 twice daily.  Recheck A1c in 6 months.  Vitamin B12 deficiency Check B12.  Vitamin D  deficiency Check vitamin D .  Hepatic steatosis This was noted on imaging several years ago.  She has done a great job with diabetes management over the last couple of years.  Will recheck c-Met today.  Morbid obesity (HCC) Her weight has plateaued over the last year.  She is working on diet.  Exercise is difficult due to her joint pain though we did discuss potential low impact options to help her with this.  Hopefully she will be able to exercise more with treatment for her back and joint pain for sports medicine.     Subjective:  HPI:  See Assessment / plan for status of chronic conditions.  Patient is here today for 75-month follow-up.  Her A1c was 5.6 at her most recent office visit.  With Mounjaro  15 mg weekly and Synjardy  12.07-998 twice daily.  At her last visit we also worked on decreasing her dose of Celebrex  and Lyrica .  We decreased the Lyrica  to 50 mg twice daily and Celebrex  to 50 mg twice daily.  She was able to wean off of both of these completely however started developing recurrent pain in her back and legs and subsequently restarted her Celebrex  at 50 mg twice daily.  She did have a steroid shot in her hip a week ago.  She has been consistent with her Celebrex  with some benefit.  She would like to have a refill on this today.  She does not believe she needs to go back on Lyrica  at this point.       Objective:  Physical Exam: BP 113/79   Pulse 82   Temp (!) 97.2 F (36.2 C) (Temporal)   Ht 5\' 8"  (1.727 m)   Wt 212 lb 9.6 oz (96.4 kg)   LMP  (LMP Unknown) Comment: last  period ---  1 year ago (?)  SpO2 97%   BMI 32.33 kg/m   Wt Readings from Last 3 Encounters:  08/16/23 212 lb 9.6 oz (96.4 kg)  06/14/23 217 lb (98.4 kg)  05/15/23 212 lb 6.4 oz (96.3 kg)    Gen: No acute distress, resting comfortably CV: Regular rate and rhythm with no murmurs appreciated Pulm: Normal work of breathing, clear to auscultation bilaterally with no crackles, wheezes, or rhonchi Neuro: Grossly normal, moves all extremities Psych: Normal affect and thought content      Leaf Kernodle M. Daneil Dunker, MD 08/16/2023 9:56 AM

## 2023-08-16 NOTE — Assessment & Plan Note (Signed)
 Check B12

## 2023-08-16 NOTE — Assessment & Plan Note (Signed)
 A1c very well-controlled 5.7.  She is working on lifestyle interventions.  Will continue Mounjaro  15 mg weekly and Synjardy  12.07-998 twice daily.  Recheck A1c in 6 months.

## 2023-08-20 ENCOUNTER — Telehealth: Payer: Self-pay

## 2023-08-20 ENCOUNTER — Other Ambulatory Visit: Payer: Self-pay | Admitting: Psychiatry

## 2023-08-20 ENCOUNTER — Other Ambulatory Visit: Payer: Self-pay | Admitting: Family Medicine

## 2023-08-20 DIAGNOSIS — F411 Generalized anxiety disorder: Secondary | ICD-10-CM

## 2023-08-20 DIAGNOSIS — F332 Major depressive disorder, recurrent severe without psychotic features: Secondary | ICD-10-CM | POA: Diagnosis not present

## 2023-08-20 DIAGNOSIS — F339 Major depressive disorder, recurrent, unspecified: Secondary | ICD-10-CM

## 2023-08-20 NOTE — Telephone Encounter (Signed)
 This was opened by mistake, Deborah Soto see previous CRM.

## 2023-08-20 NOTE — Telephone Encounter (Signed)
 Copied from CRM [id number]. Topic: Clinical - Prescription Issue >> Aug 20, 2023 11:04 AM Corin V wrote: Reason for CRM: Patient celecoxib  was called in on Friday for 1 50mg  tablet BID but she is taking 1 200mg  BID. Please send in correct script as she will be out today. Please send to [facility].

## 2023-08-21 ENCOUNTER — Telehealth: Payer: Self-pay | Admitting: *Deleted

## 2023-08-21 ENCOUNTER — Other Ambulatory Visit: Payer: Self-pay | Admitting: *Deleted

## 2023-08-21 MED ORDER — CELECOXIB 200 MG PO CAPS: 200.0000 mg | ORAL_CAPSULE | Freq: Two times a day (BID) | ORAL | 0 refills | Status: DC

## 2023-08-21 NOTE — Telephone Encounter (Signed)
 Ok to send in new prescription for 200 mg twice daily.  Jinny Mounts. Daneil Dunker, MD 08/21/2023 7:30 AM

## 2023-08-21 NOTE — Telephone Encounter (Signed)
 Celecoxib  200mg  send to pharmacy

## 2023-08-21 NOTE — Telephone Encounter (Signed)
 Copied from CRM [id number]. Topic: Clinical - Prescription Issue >> Aug 20, 2023  4:53 PM Kevelyn M wrote: Reason for CRM: Patient is calling back because she needs the strength changed for celecoxib  (CELEBREX ) 50 MG capsule. She called earlier today. She is completely out of medication. [phone]   Rx was send to patient pharmacy  [facility]

## 2023-08-22 ENCOUNTER — Ambulatory Visit: Payer: Self-pay | Admitting: Family Medicine

## 2023-08-22 LAB — VITAMIN B12: Vitamin B-12: 192 pg/mL — ABNORMAL LOW (ref 211–911)

## 2023-08-22 LAB — VITAMIN D 25 HYDROXY (VIT D DEFICIENCY, FRACTURES): VITD: 54.16 ng/mL (ref 30.00–100.00)

## 2023-08-22 LAB — TSH: TSH: 1.25 u[IU]/mL (ref 0.35–5.50)

## 2023-08-22 NOTE — Progress Notes (Signed)
 Her B12 is low. Can we check if she is taking a B12 supplement? Recommend she start 1000mcg daily if she is not doing so already.   Her cholesterol is also slightly elevated. She would benefit from restarting her atorvastatin  to lower her numbers and reduce risk of heart attack and stroke. Please send in Lipitor 10 mg daily if she is agreeable to start.   Everything else is at goal and we can recheck in a year.

## 2023-08-27 ENCOUNTER — Other Ambulatory Visit: Payer: Self-pay | Admitting: *Deleted

## 2023-08-27 DIAGNOSIS — F332 Major depressive disorder, recurrent severe without psychotic features: Secondary | ICD-10-CM | POA: Diagnosis not present

## 2023-08-27 MED ORDER — ATORVASTATIN CALCIUM 10 MG PO TABS
10.0000 mg | ORAL_TABLET | Freq: Every day | ORAL | Status: AC
Start: 2023-08-27 — End: ?

## 2023-08-27 NOTE — Progress Notes (Signed)
 Patient stated is taking Rx daily

## 2023-08-28 ENCOUNTER — Other Ambulatory Visit: Payer: Self-pay | Admitting: Psychiatry

## 2023-08-28 DIAGNOSIS — F339 Major depressive disorder, recurrent, unspecified: Secondary | ICD-10-CM

## 2023-09-03 DIAGNOSIS — F332 Major depressive disorder, recurrent severe without psychotic features: Secondary | ICD-10-CM | POA: Diagnosis not present

## 2023-09-04 ENCOUNTER — Ambulatory Visit
Admission: RE | Admit: 2023-09-04 | Discharge: 2023-09-04 | Disposition: A | Discharge status: Home / Self Care | Source: Ambulatory Visit | Attending: Family Medicine | Admitting: Family Medicine

## 2023-09-04 DIAGNOSIS — M7989 Other specified soft tissue disorders: Secondary | ICD-10-CM

## 2023-09-04 DIAGNOSIS — R928 Other abnormal and inconclusive findings on diagnostic imaging of breast: Secondary | ICD-10-CM | POA: Diagnosis not present

## 2023-09-04 DIAGNOSIS — N6320 Unspecified lump in the left breast, unspecified quadrant: Secondary | ICD-10-CM

## 2023-09-10 DIAGNOSIS — F332 Major depressive disorder, recurrent severe without psychotic features: Secondary | ICD-10-CM | POA: Diagnosis not present

## 2023-09-12 ENCOUNTER — Other Ambulatory Visit: Payer: Self-pay | Admitting: Family Medicine

## 2023-09-12 ENCOUNTER — Other Ambulatory Visit: Payer: Self-pay | Admitting: Psychiatry

## 2023-09-12 DIAGNOSIS — F411 Generalized anxiety disorder: Secondary | ICD-10-CM

## 2023-09-16 ENCOUNTER — Encounter: Payer: Self-pay | Admitting: Family Medicine

## 2023-09-16 ENCOUNTER — Ambulatory Visit: Admitting: Family Medicine

## 2023-09-16 VITALS — BP 104/78 | HR 85 | Ht 68.0 in | Wt 218.0 lb

## 2023-09-16 DIAGNOSIS — M79604 Pain in right leg: Secondary | ICD-10-CM | POA: Diagnosis not present

## 2023-09-16 DIAGNOSIS — M25551 Pain in right hip: Secondary | ICD-10-CM | POA: Diagnosis not present

## 2023-09-16 DIAGNOSIS — M5010 Cervical disc disorder with radiculopathy, unspecified cervical region: Secondary | ICD-10-CM | POA: Diagnosis not present

## 2023-09-16 DIAGNOSIS — M79605 Pain in left leg: Secondary | ICD-10-CM | POA: Diagnosis not present

## 2023-09-16 NOTE — Progress Notes (Signed)
 I, Leotis Batter, CMA acting as a scribe for [name], MD.  Deborah Soto is a 58 y.o. female who presents to [facility] at [facility] today for bilat leg pain. Pt was last seen by Dr. Lloyd on 08/07/23 and was given a R GT steroid injection.   Today, pt c/o bilat leg pain x 2 weeks, improving since onset. Locates pain to anterior left and posterior right legs. The legs feel weak and shaky. Sx worse with first standing, improves with movement. Sx tend to be better first thing in the morning, worsening throughout the day. Denies back pain. Taking Celebrex  with minimal relief.    She continues to experience right arm pain.  She was seen for this issue in late May.  An epidural steroid injection was intended but there was some sort of error and she was never contacted about scheduling an epidural steroid injection.  Dx imaging: 07/19/22 R hip XR             05/04/20 L-spine MRI             03/14/20 L-spine XR  Pertinent review of systems: No fevers or chills  Relevant historical information: Multilevel cervical fusion.   Exam:  BP 104/78   Pulse 85   Ht 5' 8 (1.727 m)   Wt 218 lb (98.9 kg)   LMP  (LMP Unknown) Comment: last period ---  1 year ago (?)  SpO2 97%   BMI 33.15 kg/m  General: Well Developed, well nourished, and in no acute distress.   MSK: C-Spine: Normal appearing Nontender palpation spinal midline. Decreased cervical motion. Upper extremity strength intact.  Hips bilaterally normal appearing Normal motion. Right hip tender palpation greater trochanter.  Hip abduction and external rotation strength are diminished.  Left hip normal motion. Tender palpation greater trochanter.  Hip abduction and external rotation strength are diminished.  Lower extremity strength is otherwise intact       Assessment and Plan: 58 y.o. female with bilateral hip pain.  Mostly due to muscle dysfunction and weakness.  She has had physical therapy for this in  the past.  She has had recurrent greater trochanter bursa injections in the past most recently in late May.  Plan to refer back to PT.  If not improving with physical therapy consider either repeat x-ray hip or eventually MRI hip.  Additionally would recommend a bit of a rheumatologic workup checking on CK and sed rate and CRP.  Right cervical radiculopathy at C7 dermatomal pattern.  Plan for epidural steroid injection.  If this is not good enough would arrange for a CT myelogram of the cervical spine.  She has somewhat hardware from prior surgeries that I fear an MRI would be too distorted to be helpful.  Of note I will be on medical leave starting on August 1 through September 15.  Dr. Kennyth or my partners Dr. Claudene or Leonce could order these interventions if not better.   PDMP not reviewed this encounter. Orders Placed This Encounter  Procedures   Ambulatory referral to Physical Therapy    Referral Priority:   Routine    Referral Type:   Physical Medicine    Referral Reason:   Specialty Services Required    Requested Specialty:   Physical Therapy    Number of Visits Requested:   1   No orders of the defined types were placed in this encounter.    Discussed warning signs or symptoms. Please see discharge instructions.  Patient expresses understanding.   The above documentation has been reviewed and is accurate and complete [name], M.D.

## 2023-09-16 NOTE — Patient Instructions (Addendum)
 Thank you for coming in today.   A referral for physical therapy has been submitted. A representative from the physical therapy office will contact you to coordinate scheduling after confirming your benefits with your insurance provider. If you do not hear from the physical therapy office within the next 1-2 weeks, please let us  know.   We've placed an order for an injection for your neck. Please let us  know if you do not hear from [facility] about scheduling over the next week or so.   See you back as needed.

## 2023-09-17 DIAGNOSIS — F332 Major depressive disorder, recurrent severe without psychotic features: Secondary | ICD-10-CM | POA: Diagnosis not present

## 2023-09-23 ENCOUNTER — Telehealth: Payer: Self-pay | Admitting: Psychiatry

## 2023-09-23 NOTE — Telephone Encounter (Signed)
 At last visit dose was decreased to 120 mg and new Rx was sent in. LVM to [facility].

## 2023-09-23 NOTE — Telephone Encounter (Signed)
 At last visit: Reduce propranolol  ER 120 mg daily to help energy and bc no tremor.   Pt reporting the goal is to totally wean off the propranolol . She is due for a RF now and asking for RF for the next dose. Will send as appropriate.   [employer]

## 2023-09-23 NOTE — Telephone Encounter (Signed)
 Pt called reporting weaning off Propranolol . Need Rx for lower dose to [facility]. Apt 7/24

## 2023-09-24 ENCOUNTER — Other Ambulatory Visit: Payer: Self-pay | Admitting: Psychiatry

## 2023-09-24 DIAGNOSIS — F411 Generalized anxiety disorder: Secondary | ICD-10-CM

## 2023-09-24 DIAGNOSIS — F339 Major depressive disorder, recurrent, unspecified: Secondary | ICD-10-CM

## 2023-09-24 DIAGNOSIS — F332 Major depressive disorder, recurrent severe without psychotic features: Secondary | ICD-10-CM | POA: Diagnosis not present

## 2023-09-24 MED ORDER — PROPRANOLOL HCL ER 80 MG PO CP24: 80.0000 mg | ORAL_CAPSULE | Freq: Every day | ORAL | 0 refills | Status: DC

## 2023-09-24 NOTE — Telephone Encounter (Signed)
 sent

## 2023-09-25 NOTE — Telephone Encounter (Signed)
 Pt notified of Rx/new dose for propranolol .

## 2023-09-25 NOTE — Telephone Encounter (Signed)
Verify dose?

## 2023-09-26 ENCOUNTER — Other Ambulatory Visit: Payer: Self-pay | Admitting: Family Medicine

## 2023-10-01 DIAGNOSIS — F332 Major depressive disorder, recurrent severe without psychotic features: Secondary | ICD-10-CM | POA: Diagnosis not present

## 2023-10-03 ENCOUNTER — Encounter: Payer: Self-pay | Admitting: Psychiatry

## 2023-10-03 ENCOUNTER — Ambulatory Visit: Admitting: Psychiatry

## 2023-10-03 DIAGNOSIS — G4733 Obstructive sleep apnea (adult) (pediatric): Secondary | ICD-10-CM | POA: Diagnosis not present

## 2023-10-03 DIAGNOSIS — F411 Generalized anxiety disorder: Secondary | ICD-10-CM

## 2023-10-03 DIAGNOSIS — F339 Major depressive disorder, recurrent, unspecified: Secondary | ICD-10-CM | POA: Diagnosis not present

## 2023-10-03 DIAGNOSIS — G4710 Hypersomnia, unspecified: Secondary | ICD-10-CM

## 2023-10-03 DIAGNOSIS — G251 Drug-induced tremor: Secondary | ICD-10-CM

## 2023-10-03 DIAGNOSIS — R7989 Other specified abnormal findings of blood chemistry: Secondary | ICD-10-CM

## 2023-10-03 DIAGNOSIS — R52 Pain, unspecified: Secondary | ICD-10-CM

## 2023-10-03 MED ORDER — AUVELITY 45-105 MG PO TBCR: 1.0000 | EXTENDED_RELEASE_TABLET | Freq: Two times a day (BID) | ORAL | 0 refills | Status: DC

## 2023-10-03 MED ORDER — VILAZODONE HCL 40 MG PO TABS: 60.0000 mg | ORAL_TABLET | Freq: Every day | ORAL | 1 refills | Status: DC

## 2023-10-03 MED ORDER — LITHIUM CARBONATE ER 450 MG PO TBCR: 450.0000 mg | EXTENDED_RELEASE_TABLET | Freq: Every day | ORAL | 1 refills | Status: DC

## 2023-10-03 MED ORDER — PROPRANOLOL HCL ER 60 MG PO CP24: 60.0000 mg | ORAL_CAPSULE | Freq: Every day | ORAL | 0 refills | Status: DC

## 2023-10-03 MED ORDER — OLANZAPINE 5 MG PO TABS: 5.0000 mg | ORAL_TABLET | Freq: Every day | ORAL | 1 refills | Status: AC

## 2023-10-03 NOTE — Progress Notes (Signed)
 Deborah Soto [id number] [birth year] 58 y.o.     Subjective:   Patient ID:  Deborah Soto is a 58 y.o. (DOB 1965-07-04) female.  Chief Complaint:  Chief Complaint  Patient presents with   Follow-up   Depression   Anxiety   Fatigue   Depression        Associated symptoms include fatigue and myalgias.  Associated symptoms include no decreased concentration.   Deborah Soto presents to the office today for follow-up of TRD and anxiety, and insomnia.  seen December 08, 2018.  She was complaining of lithium  tremor and some cognitive problems.  Hydroxyzine  was stopped.  Propranolol  increased to 60 mg twice daily for tremor.  Lithium  level and amitriptyline  levels were requested. Amitriptyline  level to 37, nortriptyline  49 on amitriptyline  250 mg nightly. Lithium  level 0.800 mg daily  Phone call on October 6 to discuss lab results as follows: Note   ----- Message from [name] Geoffry Mickey., MD sent at 12/15/2018 10:08 AM EDT ----- Lithium  level stable at 0.8 in a good range.  The previous was 0.7.  She is having some tremor issues.   Normal BMP including excellent creatinine and calcium  levels.  Blood sugar is high but she is aware of that problem.   TSH is within normal limits.   Serum amitriptyline  level is 286 which is at the upper end of the normal range and suggestive that we not try further increase.  We will discuss further options at her follow-up appointment.   No medication adjustments required, but because of her tremor and her lithium  level is slightly higher than it was with the prior level if she wants to reduce the lithium  from 3 of the 300 mg tablets daily to 2-1/2 of the 300 mg tablets daily she can do so.  At her last appointment she was also encouraged to try a higher dose of propranolol  and that may have resolved the problem.  If it did do not reduce the lithium  but if it did not and she wants to reduce the dose she can do so as noted.  She should call us  if she  has any recurrence of symptoms.   Lorene Geoffry MD, [name]     She had repeat lithium  level at 750 mg daily of 0.  7 with some improvement in tremor.  In phone call on November 20 she reported mood was worse with weepiness, overreacting, pity party.  Because mood symptoms were worse with the reduction lithium  she was encouraged to increase the dosage back to 900 mg daily.   Last seen February 04, 2019.  The following changes were made: Increase propranolol  to 80 mg twice daily for lithium  tremor and anxiety her blood pressure and pulse appear high enough that she can tolerate this dosage.  Disc Se.  Disc risk with low BP and DM.  He has not been having any problems with low blood sugar. Amitriptyline  6 or 300 mg daily if tolerated.  Try to take some about 4 -5 hours before sleep Try to take lithium  also 4-5 hours before sleep to minimize daytime tremor Add methylphenidate  ER 27 mg in the morning.  If no response we will increase the dosage.  Seen May 08, 2019 with her husband. Aside from physically feeling like she can't do much then having memory issues.  Forgetful.  Loses track of thoughts between tasks.  Most embarrassing being around people and word-finding issues.   Problems with walking bc balance and weakness.  Fears falling.  Only fall at Xmas morning.  Tremor is awful.  Affects keying.   H notices cognitive problems and forgetfulness.    Shaking got really bad and got lithium  level as low as 600 mg but the next day felt really helpless and everything seemed to be aimed at hurting me and felt disrespected.  Problems with boss and 58 yo taking 5 AP classes and 2 volleyball classes.  I've been on her to accomplish all these things.  She claimed that pt screaming at her.   H CO she was more irritable after the reduction in lithium .   H CO her anxiety also seems worse.  H says the last year has been unhealthy with 12 hour work days for her.   Doesn't go out much.    Attention problems  more noticeable.  Still dropping and tremor.  Tolerated the increase in propranolol .    CO shakiness from lithium  which interferes with typing. Also a good bit of anxiety generally without panic.  BS are high.  NotTaking hydroxyzine .   sleeping better and still using CPAP.  Using CPAP regularly. Less awakening than before.  No SE. depression was worse after reducing the lithium .  Irritability was also worse after reducing the lithium  to 600 mg daily.  seen May 08, 2019.  Multiple changes made:  Reduce propranolol  to 60 mg twice daily for lithium  tremor and anxiety since the increase was not helpful.   Restart B6 for lithium  tremor. 500 mg BID. She forgot and doesn't want more. Try to take lithium  also 4-5 hours before sleep to minimize daytime tremor. depression was worse after reducing the lithium .  Irritability was also worse after reducing the lithium  to 600 mg daily. So reluctant to reduce it further.  It is unclear how to address the benefits of lithium  without using lithium  other than to consider Spravato  Increase methylphenidate  ER 36 mg in the morning. Increase methylphenidate  ER 36 mg in the morning.  Wait 1-2 weeks then reduce amitriptyline  to 5 tablets nightly to see if cognition is better. Also saw Dr. [name] re: sleep disorder.  05/2019 appt with the following noted: Lost Rx of Concerta  36 so didn't take it long.  Out of it.  Still shakey and cognitive problems.  Dep 5/10/  Anxiety 6/10 worse at night.  No SI.  8 hours sleep.  Mild panic couple times daily. Frustrated with tremor and sleepiness which is worse than tiredness.   History of low vitamin D  taking 150K/week but stopped 6 mos ago. Plan : Modafinil  100 mg tablet 1 dailly for 1 week, Then add Concerta  36 mg 1 each AM.  Option increase modafinil  mid April  Restart B6 for lithium  tremor. 500 mg BID. Get lab test at earliest convience  10/14/2019 appointment with the following noted: Has been on amitriptyline  250 mg  for mos.  Tried a week ago reducing to 200 and felt worse so back on 250. Weaned off Lyrica  about a month ago.  Not much change off it.   Not sure if it helped energy or alertness. Saw Dr. Dyan in consultation. Mental clarity is a lot better with stimulants and not sleeping as much in daytime with current meds.  Severe drowsiness is a lot better.   Off metformin  and added Glipizide.   Not great with depression 5/10.  Anxiety can trigger problems with thinking and somatic sx   I feel like it's something physical and can keep her up at night. Occ racing heart or  feeling hot.  Caffeine variable.  Plan:Plan: Increase methylphenidate  from 36 to 54 mg in the morning. Increase amitriptyline  to 6 tablets in the evening    11/12/19 TC noting call: Pt called stated she's not feeling well at all. Ask for sooner apt than 10/7. Depression, anxiety & focus is much worse. Has not planned to hurt herself but thoughts of things would be better if I was not here have crossed her mind often. Contact ASAP @ [phone].  I put Pt on canc list  MD response: I reviewed the meds and previous med lists.  The next options are either Spravato or a major med change from amitriptyline  which is not something I can do outside of an appt.  She's on cancellation list and we'll try to work her in sooner.  Nothing I can change right now except since the increase in amitriptyline  has not helped, she can reduce it to 200 mg nightly in preparation for other changes to come. Nursing response after talking with pt: Discussed symptoms and medications with patient, she did not increase her Amitriptyline  50 mg up to 6 tablets so she will start that today. Said you may have told her that but she didn't remember. She has read about Spravato but she is not wanting to proceed with that. Informed her she's on cancellation list as well. She will call back with worsening signs or symptoms.  11/23/19 appt with the following noted: Forgot to increase  amitriptyline  until noted. Tolerating it OK but hasn't had time to help. More alert with Concerta  increase to 54 mg daily also with modafinil .  A little more focused and better productivity.  Still forgetful.   No SE noted except GI issues from diarrhea to constipation.   Still miserable and cry easily.  Try to numb herself.  Always tired especially emotionally.  Anxiety is high and predates current stimulants. No therapy since [facility].  She felt he pushed too hard for her to make changes.  I don't feel like I can deal with him right now. Plan: Continue meds & The increase in amitriptyline  to 300 mg daily needs more time.  01/12/2020 appointment with the following noted: Not well.  Mood horrible.  Easily frustrated sometimes without reason and may nap or cry.  Anxiety is pretty bad.  Daytime sleepiness seems worse.  Can get nausea from anxiety.  Feels like function is slow.  Have to write everything down.  Hates home, job, high school volleyball program.  At some point I have to take responsibility.   Amitriptyline  also not helping pain any.  Clenching jaw for 3 weeks. Likcking lips.  Picking fingers. D will play volleyball at [employer]. Plan: Reduce amitriptyline  by 1 tablet every 4 days. Start Viibryd  10 mg daily with food for 7 days, then 20 mg daily for 7 days, then increase to 40 mg daily.  03/18/20 appt urgently made and noted:  Seen with husband. Still with jaw clenching day and night despite off stimulant. Now on Viibryd  60 mg since 03/02/20 and no stimulant., lithium  900 mg, and propranolol  80 BID. Rare hydroxyzine  DT sleepiness, Gabapentin  300 TID. Not taking it with food.  LT sensitivity to noise and light and overstimulated and now bothering her more at work.  Always had to use shirts without tags and sensitivity to smells. Easily agitated in public per H.  Gets negative and wants to give up and not live anymore.  She says there's less chance of acting on it  if she tells  H about it and she's doing so now.  Pain worse off amitriptyline  esp around ribs and general diffuse pain.  Anxiety worse with stimulants and hasn't gone away.  Trouble staying asleep. Plan: Need to take Viibryd  60 with food emphasized.  She's not currently doing so. Restart low-dose amitriptyline  for pain  04/22/2020 appointment with following noted: Only restarted amitriptyline  50 mg last week.  Stays asleep better with it.  Sleep 8-9 hours now but no change in pain yet.  Sleepier in day recently. Done a lot of travel lately. Maybe viibryd  making a little difference with mood and anxiety now that has been on it longer.  Needs to take Viibryd  in AM bc seems to interfere with sleep. Final season of travel volleyball with good team. H notes still some irritability. Pain worse off amitriptyline .  Can reduce to  amitriptyline  25 for pain if 50 mg remains too sedating.  Is sleeping better.      06/21/2020 appointment with following noted:  Seen with H Tolerating amitriptyline  50 mg HS with decent sleep. Really crappy and as bad as 10 years.  Situationally life sucks.  Work environment is bad with coworkers.  Since Sept work is bad.  Doesn't feel people are supportive at home or work. H working 12 hour days.  When she's off wants to sleep all day.   Still in therapy with [facility].  Is good therapist. 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  Check lithium  level Referred to Dr. Vincente for Spravato  11/03/2020 appointment with the following noted: 58 yo D got Covid but is OK. Still doing Spravato 84 mg twice weekly and plans to continue for awhile. And for a total of 3 mos.  Tolerated well.  It's a nice feeling.   Didn't seem to help the depression for a couple of months.   It does feel different. SI has resolved lately which is particularly impressive given she  has a lot of free time. She has lot s of time with nothing to do bc quit her job. Job environment got worse and worse.  Had been there 9 years.   Doing PT for pain in her back.  Ongoing physical issues.   Mood clearly better with Spravato but not resolved.   Neck fusion recently. Plan: check lithium  level Reduce olanzapine  to 5 mg HS  12/30/2020 appointment with the following noted: Surges of anxious feelings in her chest without heart racing several times daily last 15 mins and without pcpt.  Stay a little on edge all the time.   Left job and too much time on her hands. Youngest at [employer]  and [employer] in new position.  She's doing well statistically.  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 Spravato twice weekly.  Is better with it but still some mild cycling of depression.  Still upset over leaving work.  SI very rare and fleeting.  Had a really good plan but doesn't dwell on the plan. Lithium  level 0.7 on 900 mg HS. Reduced olanzapine  to 5 mg HS and has felt a little worse. Viibryd  60 mg daily. Amitrptyline 50 for pain.  Not sure if it helps.  PT helped a lot with tension in neck and down spine. Sleeping pretty well after 8 hours. Plan increase olanzapine  back to 10 mg nightly for treatment resistant depression and anxiety.  Anxiety was worse after reducing it and no symptoms were improved after reducing it. Try to stop amitriptyline  because it is probably not needed in the presence of olanzapine . Check lithium  level  03/14/2021 appointment noted: Increase olanzapine  to 10 mg for anxiety spells that are unexpected and hit her in the chest esp in the morning. Very low right now.  Twice weekly Spravato and felt good for awhile.  Maybe a slump after Xmas.  Not much social interaction since she quit her job.  Xmas gave her focus.  Doesn't have this now.   Mark working 14-16 hours daily.  This won't get better. Kids are doing great but not around Sleep well generally and that is a benefit of  olanzapine . Tolerates it well.   Plan: Trial increase olanzapine  to 15 mg HS  05/03/2021 appointment with the following noted: Still getting Spravato twice weekly.   I love it.  Totally relaxed Depression and anxiety SI is gone or fleeting with Spravato. Still in therapy with [facility] and thinks a lot of stuff go back to childhood.  Was always criticized for normal childhood mistakes.  So coped by overcompensating with going big and grand. Can't get energy up to do anything. Never felt she had a manic periods except overstimulated CC anxious feeling in body tingling and tight uncomfortable feeling.  Reminds her of stimulant effects but less intense and worse in AM. Everything still seems to hard like going to grocery store and worry over messing up cooking. Watches too much TV For a little while increase olanzapine  seemed to help.  Tolerating it. Depression is not that bad compared to the past. Plan: no med changes  07/17/21 appt noted:  seen with H Depression with twice weekly Spravato. But a lot of general anxiety and then spells of getting tingly and nervous.  Not triggers.   Using propranolol  BID. Anxiety is continuation of the way it was before.  Doesn't seem to vary with time of day.  Awakens with anxiety. No SE noted except sleepy.  Sleeping at least 10-11 hours.  Not obviously worse with increase olanzapine .  Viibryd  in AM bc insomnia otherwise. Balance trouble with hunched walk and she feels like legs are waker than they should be. Plan: Stop regular propranolol  and start ER propranolol  ER 1 at night for anxiety Reduce clonidine  to 1/2 tablet twice daily Wait 1 week and reduce olanzapine  to 1/2 tablet nightly to see if energy, alertness and balance are better. Starting June 1 stop clonidine .  09/05/21 appt noted: Still low energy.  No changes with less meds. Anxiety a little better last week for unclear reasons. She feels olanzapine  helps sleep and doesn't want to stop it.   Sleep a lot.  11-11 if she can. Therapist working with her on childhood trauma. Long period of depression. Plan: Start Auvelity  1 daily for a week then 1 twice daily  11/27/21 appt noted:  Couple of phone calls and tremor problems and needed to reduce lithium  to 1 and 1/2 tablets and it helped reduce the tremor. Maybe a little improvement.  Not so depressed but kind of numb.   Spells of anxiety but a little better.  Can feel it in chest and arms at times every other night.   Continues spravato twice weekly.   Sleep is great. Off clonidne.  Contiues Aubelity twice daily, lithium  675 mg daily, olanzapine  5 mg HS, viibryd  60 mg daily. Sleep from 11-9 and sleeps well. Plan: Trial reduction in olanzapine  to  2.5 mg nightly.  02/14/22 appt noted: Noticed FM got better in summer and then relapsed.  Balance worth with FM also.   Did reduce Spravato from twice weekly to weekly in October.  Did it twice weekly for a year now.  Thinks maybe that is why FM flared.  Continues to weekly. No SI in over a years.  Better dresssed and using makeup now.  Wasn't for about 5 years.. Getting closer to feeling normal .  Depression is still good. Reduced and increased gabapentin  to 900 BID. No other med changes except baclofen  prn which is marginally helpful. Feels better and got motivated to buy a car.  She's more active and less depressed than she has been.  Gets showered and dress daily before leaving [place]. Less numb emotionally than when last here.  Some excitement. Asks about coming off the Auvelity  to reduce meds. A1C is 5.   Sleep is better and using CPAP Plan: no med change.  Continue Auvelity  1 twice daily, continue lithium  CR 450 mg tablets 2 nightly, continue olanzapine  2.5 mg nightly, continue propranolol  ER 160 mg daily, continue Viibryd  60 mg daily.  05/16/22 appt noted: Meds as above.  Note she was prescribed Mounjaro . switched from gabapentin  to Lyrica  150 mg twice daily,  Missed Auvelity  7 days  and felt better when restarted it and I think it helps. Spravato twice weekly early Jan hoping to help pain but didn't. Still dealing with chronic pain esp around abd but also general aches.  Lyrica  has helped some No SE Doing well other than pain.  Had a dip in mood late DEC into early Feb without SI but not interested but better now. Interest in cookie decorating and to make for Deborah Soto wedding in next year. Plan no changes  08/01/22 appt noted: Psych med: Auvelity  BID, lithium  CR 675 mg nightly, olanzapine  2.5 mg nightly, propranolol  ER 160 mg daily, Viibryd  60 mg daily, vitamin D  50000 units weekly.  Also on pregabalin  150 mg every morning and 300 mg nightly for fibromyalgia pain.  Spravato twice weekly. Doing pretty well and better than usual.   Still in [facility] with [facility].  Dealt with feelings worthlessness out of childhood.  Parents were very critical.  Has let a lot of this go. This is best I've felt in a long time.   Tolerating meds. New hobby is baking.   Going to see M in [place] in July which could be triggering.  Sister is there too and much like her. Sister can be critical too.  Her kids are going too.   Had a nice mother's Day affirming.  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   Sleep is good. Plan no changes  10/03/22 appt noted: Psych med: Auvelity  BID, lithium  CR 675 mg nightly, olanzapine  2.5 mg nightly, propranolol  ER 160 mg daily, Viibryd  60 mg daily, vitamin D  50000 units weekly.  Also on pregabalin  150 mg every morning and 300 mg nightly for fibromyalgia pain.  Spravato twice weekly. Hesitant to try cutting back to once weekly Spravato bc upcoming family vacation. Doing therapy .  Father is passed.  M can be dismissive and critical.  Wrote a letter letting go of the past.   M on O2.  Family in [place].   Better energy.  Big family gathering this next week.   Dealing with DM unstable craving.   Plan no med changes  01/03/23 appt noted; No  med changes, continue: Auvelity  BID,  lithium  CR 675 mg nightly, olanzapine  2.5 mg nightly, propranolol  ER 160 mg daily, Viibryd  60 mg daily, vitamin D  50000 units weekly.  Also on pregabalin  150 mg every morning and 300 mg nightly for fibromyalgia pain.  Spravato twice weekly. Would like to cut Spravato to once weekly bc has things to do.  Involved in campaigns.  Passionate about it.   Mood is much better.  Doing really well overall and seeing [facility] every other week.   Made big progress and feels like a blanket taken off.  Write a lot of therapeutic letters.  Gotten very busy since then.  Will start baking again and [employer] business.  Enjoying the research.   Involved in early voting outreach.  D's teachers. Couple of times forgetting night time meds.  Poor sleep if forgets night meds.   Having fun in life.    03/25/23 appt noted: No med changes, continue: Auvelity  BID, lithium  CR 675 mg nightly, olanzapine  2.5 mg nightly, propranolol  ER 160 mg daily, Viibryd  60 mg daily, vitamin D  50000 units weekly.  Also on pregabalin  150 mg every morning and 300 mg nightly for fibromyalgia pain.  Spravato twice weekly. Still getting Spravato went to once weekly. Been ok.  Some of the excitmemt has leveled out from last viist. DT holdidays was spread out more than usual.  Don't like the holidays.  Could have been holidays.   Seeing therapist [facility] every other week.  Not really down now.  Even on low days nothing like before.   Wants to wean off lithium  bc no longer suicidal.   Has lots of ideas but would like more energy.     Depression started after children born.      05/23/23 appt noted: Med: Auvelity  BID, reduced and increased lithium  CR 675 mg nightly, olanzapine  2.5 mg nightly, propranolol  ER 160 mg daily, Viibryd  60 mg daily,  vitamin D  50000 units weekly.  Also reducing pregabalin  50 mg every morning and 50 mg nightly for fibromyalgia pain.  Spravato once weekly. No SE  D getting married next month.    Working on garden.  Started [employer] company to [employer]. Doing some PT work early Jan 8 hours / week which has been good so far.  Busy .   Generally mood wise happy but not sleepy that well.  Wonders about incr olanzapine .  More bad dreams 50% of time and some NM.  Sleep worse in the past month.   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  Had a rage reaction after reducing lithium  to less lithium  and better with incr back to 675 mg . Feel so much better and more outgoing.  Than before.  Pleased with Spravato.  Empty nester's Not having much FM pain.   DM is controlled.  Still trouble with wt.  Not binge eating.   Plan: Done well with reduced olanzapine  to 2.5 mg HS for TRD until lately worsening sleep, temporarily increase for sleep to 5 mg HS  08/01/23 appt noted: Med: Auvelity  BID, reduced back lithium  CR 450 mg nightly, olanzapine  5 mg nightly, propranolol  ER 160 mg daily, Viibryd  60 mg daily,  vitamin D  50000 units weekly.  Stopped Lyrica  for fibromyalgia pain and it's worse.  Spravato once weekly.  On Mounjaro .   No SE D wedding went well.   Ok other than pain worse.  Maybe worse from stopping Lyrica .  Stopped it bc didn't think it was helping but pain is worse. Lost to 200# and now 210#.   Still  have a sweet tooth.  Doing Hungry root.   Sleep is better with olanzapine . But brief awakening.   Fair bit of anxiety which will hit in chest.   Some chronic tiredness.   Doing meditation for women going through menopause to lower stress hormones.   Plan: continue: Auvelity  BID,  Continue lithium  450 mg HS  olanzapine  5 mg nightly,  Reduce propranolol  ER 120 mg daily to help energy and bc no tremor.  Viibryd  60 mg daily, vitamin D  50000 units weekly.    Spravato twice weekly.  10/03/23 appt noted:  Med: Auvelity  BID, reduced back lithium  CR 450 mg nightly, olanzapine  5 mg nightly, propranolol  ER red from 160 mg to 80 mg daily, Viibryd  60 mg daily,  vitamin D  50000 units weekly.  Stopped Lyrica  for fibromyalgia pain and  it's worse.  Spravato once weekly.  On Mounjaro .   No SE No tremor recurred and no difference with less propranolol .   Gardening and baking.  Sometimes not the energy to get up and do it.   No longer suicidal.  Was more active than she was.   Some anxiety in phases. Chest anxiety not worse.   Not sig dep.  When severely dep watched a lot of animal shows.   Hip pain may interfere with sleep.     ECT-MADRS    Flowsheet Row Office Visit from 05/03/2021 in [facility]  MADRS Total Score 32   GAD-7    Flowsheet Row Office Visit from 02/13/2023 in [facility] [place]  Total GAD-7 Score 7   PHQ2-9    Flowsheet Row Office Visit from 08/16/2023 in [facility] [place] Office Visit from 05/15/2023 in [facility] from 04/16/2023 in [facility] from 02/13/2023 in [facility] from 10/31/2022 in [facility] [place]  PHQ-2 Total Score 0 0 0 1 0  PHQ-9 Total Score 0 0 0 3 0   Flowsheet Row Admission (Discharged) from 08/17/2020 in [facility] Pre-Admission Testing 62 from 08/16/2020 in [facility] PREADMISSION TESTING  C-SSRS RISK CATEGORY No Risk No Risk    Deborah Soto 16 and dating.  Past Psychiatric Medication Trials:  Failed ECT and TMS,  Spravato started 07/2020 amitriptyline  helped for 2 to 3 years in 1987,   2021 No benefit from amitriptyline  300 mg with adequate duration.  Nortriptyline  200 with a therapeutic blood level. Trintellix, duloxetine  120, Lexapro  20, Fetzima,  Wellbutrin, fluoxetine, paroxetine, sertraline,  (Poor response to SSRIs) Viibryd  60 [place],   lithium  SE,  Olanzapine  2.5 ins, 5 mg sleep better Latuda 120, Abilify 15,  Vraylar, Rexulti ,Seroquel 400, risperidone,  Geodon,  Haldol for agitation, lamotrigine 300,  buspirone , clonazepam, gabapentin , Propranolol , clonidine  0.1 mg BID modafinil ,  Nuvigil , Vyvanse, Concerta  Pramipexole SE compulsive  SHE DOESN'T WANT STIMULANTS AGAIN bc of anxiety. Trazodone ,   GENESIGHT COMPLETED  history of overdose on Xanax, There is a history of suicide attempts and psychiatric hospitalizations.  New PCP good [place] [facility]   Review of Systems:  Review of Systems  Constitutional:  Positive for fatigue. unchanged Cardiovascular:  Negative for chest pain and palpitations.  Gastrointestinal:  Negative for diarrhea.  Musculoskeletal:  Positive for back pain and myalgias. Negative for gait problem.  Less pain lately.  Neurological:  Positive for mild tremors .  Negative for dizziness.  Psychiatric/Behavioral:  Positive for dysphoric mood. Negative for confusion, decreased concentration, hallucinations and self-injury. The patient is nervous/anxious.     Medications: I have reviewed the patient's current medications.  Current Outpatient Medications  Medication Sig Dispense Refill   atorvastatin  (LIPITOR) 10 MG tablet Take 1 tablet (10 mg total) by mouth daily.     celecoxib  (CELEBREX ) 200 MG capsule Take 1 capsule (200 mg total) by mouth 2 (two) times daily. 60 capsule 0   Continuous Blood Gluc Receiver (DEXCOM G7 RECEIVER) DEVI Use 4 times daily to check blood sugar 1 each 11   Continuous Glucose Sensor (DEXCOM G7 SENSOR) MISC USE AS DIRECTED FOUR TIMES DAILY TO CHECK BLOOD SUGAR 3 each 2   Empagliflozin -metFORMIN  HCl ER (SYNJARDY  XR) 12.07-998 MG TB24 TAKE TWO TABLETS BY MOUTH DAILY 60 tablet 2   Empagliflozin -metFORMIN  HCl ER (SYNJARDY  XR) 25-1000 MG TB24 Take 1 tablet by mouth daily.     Esketamine HCl, 84 MG Dose, (SPRAVATO, 84 MG DOSE,) 28 MG/DEVICE SOPK Place 84 mg into the nose See admin instructions. Use weekly     Ginger, Zingiber officinalis, (GINGER PO) Take by mouth.     Insulin  Pen Needle (PEN  NEEDLES) 33G X 4 MM MISC Use daily as needed to inject insulin  100 each 1   ipratropium (ATROVENT ) 0.03 % nasal spray SMARTSIG:2 Spray(s) Both Nares 3 Times Daily PRN     lidocaine  (LIDODERM ) 5 % Place 1 patch onto the skin every 12 (twelve) hours. Remove & Discard patch within 12 hours or as directed by MD 30 patch 2   MOUNJARO  15 MG/0.5ML Pen INJECT 0.5MLS SUBCUTANEOUSLY ONCE A WEEK 6 mL 11   pantoprazole  (PROTONIX ) 40 MG tablet TAKE ONE TABLET BY MOUTH ONCE DAILY FOR STOMACH PROTECTION 30 tablet 5   rivaroxaban  (XARELTO ) 20 MG TABS tablet TAKE ONE TABLET BY MOUTH DAILY WITH SUPPER 30 tablet 2   tretinoin (RETIN-A) 0.05 % cream SMARTSIG:Sparingly Topical Every Night     tretinoin (RETIN-A) 0.1 % cream nightly.     trimethoprim -polymyxin b  (POLYTRIM ) ophthalmic solution Place 2 drops into both eyes every 6 (six) hours. 10 mL 0   TURMERIC PO Take by mouth.     Vitamin D , Ergocalciferol , (DRISDOL ) 1.25 MG (50000 UNIT) CAPS capsule TAKE ONE CAPSULE BY MOUTH EVERY 7 DAYS 15 capsule 1   Dextromethorphan-buPROPion ER (AUVELITY ) 45-105 MG TBCR Take 1 tablet by mouth 2 (two) times daily. 180 tablet 0   lithium  carbonate (ESKALITH ) 450 MG ER tablet Take 1 tablet (450 mg total) by mouth daily in the afternoon. 90 tablet 1   OLANZapine  (ZYPREXA ) 5 MG tablet Take 1 tablet (5 mg total) by mouth at bedtime. 90 tablet 1   propranolol  ER (INDERAL  LA) 60 MG 24 hr capsule Take 1 capsule (60 mg total) by mouth daily. 30 capsule 0   Vilazodone  HCl (VIIBRYD ) 40 MG TABS Take 1.5 tablets (60 mg total) by mouth daily. 135 tablet 1   No current facility-administered medications for this visit.    Medication Side Effects: Sedation  Allergies:  Allergies  Allergen Reactions   Tramadol  Other (See Comments)    Tingling all over    Hydrocodone      Ineffective     Past Medical History:  Diagnosis Date   Anemia    Anxiety    Asthma    Depression    Diabetes mellitus type 2 in obese  06/11/2016   Dyspnea     Embolism - blood clot January 1997 & January 2017   Also had LLE DVT in 1997   History of kidney stones    Hypertension    Preeclampsia 10/10/2011   1999    Sleep apnea     Family History  Problem Relation Age of Onset   Dementia Father    Heart disease Father    Clotting disorder Mother    Arthritis Mother    Clotting disorder Sister    Clotting disorder Maternal Grandmother    Clotting disorder Maternal Aunt    Rheumatologic disease Neg Hx    Hyperparathyroidism Neg Hx     Social History   Socioeconomic History   Marital status: Married    Spouse name: Not on file   Number of children: Y   Years of education: Not on file   Highest education level: Not on file  Occupational History   Occupation: admin assist    Comment: insurance verification  Tobacco Use   Smoking status: Former    Current packs/day: 0.00    Average packs/day: 1 pack/day for 20.0 years (20.0 ttl pk-yrs)    Types: Cigarettes    Start date: 03/12/1984    Quit date: 03/12/2004    Years since quitting: 19.5    Passive exposure: Past   Smokeless tobacco: Never  Vaping Use   Vaping status: Never Used  Substance and Sexual Activity   Alcohol use: Yes    Alcohol/week: 1.0 standard drink of alcohol    Types: 1 Cans of beer per week    Comment: 1-2 times a week   Drug use: No   Sexual activity: Yes    Partners: Male    Birth control/protection: None  Other Topics Concern   Not on file  Social History Narrative   Originally from [place]. Always lived in [place] . 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 No international travel recently. Previously has been to [place] in May 1996. No mold exposure recently. No bird exposure. Does have multiple pets.    Social Drivers of Corporate investment banker Strain: Not on file  Food Insecurity: Not on file  Transportation Needs: Not on file  Physical Activity: Not on file  Stress: Not on file  Social Connections: Unknown (07/24/2021)   Received from [facility]    Social Network    Social Network: Not on file  Intimate Partner Violence: Unknown (06/15/2021)   Received from [facility]   HITS    Physically Hurt: Not on file    Insult or Talk Down To: Not on file    Threaten Physical Harm: Not on file    Scream or Curse: Not on file    Past Medical History, Surgical history, Social history, and Family history were reviewed and updated as appropriate.   Please see review of systems for further details on the patient's review from today.   Objective:   Physical Exam:  LMP  (LMP Unknown) Comment: last period ---  1 year ago (?)  Physical Exam Constitutional:      General: She is not in acute distress. Musculoskeletal:        General: No deformity.  Neurological:     Mental Status: She is alert and oriented to person, place, and time.     Cranial Nerves: No dysarthria.     Coordination: Coordination normal.  Psychiatric:        Attention and Perception: Attention and perception normal.  She does not perceive auditory or visual hallucinations.        Mood and Affect: Mood is euthymic. Affect is not labile, blunt, angry, tearful or inappropriate.    Not as severe as in the past. Not irritable.    Speech: Speech normal.        Behavior: Behavior normal. Behavior is cooperative.        Thought Content: Thought content normal. Thought content is not paranoid or delusional. Thought content does not include homicidal or suicidal ideation. Thought content does not include suicidal plan.        Cognition and Memory: Cognition and memory normal.        Judgment: Judgment normal.     Comments: Insight intact    Lab Review:     Component Value Date/Time   NA 140 08/16/2023 1007   NA 138 08/21/2019 0000   NA 140 12/11/2016 1236   K 3.9 08/16/2023 1007   K 4.2 12/11/2016 1236   CL 106 08/16/2023 1007   CO2 27 08/16/2023 1007   CO2 24 12/11/2016 1236   GLUCOSE 137 (H) 08/16/2023 1007   GLUCOSE 109 12/11/2016 1236   BUN 20 08/16/2023 1007    BUN 12 08/21/2019 0000   BUN 15.1 12/11/2016 1236   CREATININE 0.66 08/16/2023 1007   CREATININE 0.68 02/22/2020 1649   CREATININE 0.8 12/11/2016 1236   CALCIUM  9.8 08/16/2023 1007   CALCIUM  10.0 12/11/2016 1236   PROT 6.8 08/16/2023 1007   PROT 7.5 12/11/2016 1236   ALBUMIN 4.6 08/16/2023 1007   ALBUMIN 4.4 12/11/2016 1236   AST 17 08/16/2023 1007   AST 38 (H) 12/11/2016 1236   ALT 19 08/16/2023 1007   ALT 61 (H) 12/11/2016 1236   ALKPHOS 43 08/16/2023 1007   ALKPHOS 71 12/11/2016 1236   BILITOT 0.5 08/16/2023 1007   BILITOT 0.62 12/11/2016 1236   GFRNONAA >60 08/16/2020 1157   GFRNONAA 105 12/11/2018 1121   GFRAA 122 12/11/2018 1121       Component Value Date/Time   WBC 7.5 08/16/2023 1007   RBC 4.91 08/16/2023 1007   HGB 14.3 08/16/2023 1007   HGB 14.5 12/11/2016 1236   HCT 43.2 08/16/2023 1007   HCT 43.9 12/11/2016 1236   PLT 235.0 08/16/2023 1007   PLT 289 12/11/2016 1236   PLT 295 07/24/2016 1848   MCV 88.0 08/16/2023 1007   MCV 93.2 12/11/2016 1236   MCH 29.9 08/16/2020 1157   MCHC 33.2 08/16/2023 1007   RDW 15.2 08/16/2023 1007   RDW 13.5 12/11/2016 1236   LYMPHSABS 2.2 11/22/2020 1223   LYMPHSABS 2.4 12/11/2016 1236   MONOABS 0.6 11/22/2020 1223   MONOABS 0.7 12/11/2016 1236   EOSABS 0.1 11/22/2020 1223   EOSABS 0.7 (H) 12/11/2016 1236   BASOSABS 0.0 11/22/2020 1223   BASOSABS 0.0 12/11/2016 1236    Lithium  Lvl  Date Value Ref Range Status  11/22/2020 0.7 0.6 - 1.2 mmol/L Final   11/22/20  lithium  0.7 on 900   Serum nortriptyline  level on 150 mg a day was 109  Amitriptyline   Level 12/11/2018 =237  On 250 mg daily,.  12/23/2020 vitamin D  level 31.38, vitamin B12 519  No results found for: PHENYTOIN, PHENOBARB, VALPROATE, CBMZ   .res Assessment: Plan:    Recurrent major depression resistant to treatment (HCC) - Plan: Vilazodone  HCl (VIIBRYD ) 40 MG TABS, OLANZapine  (ZYPREXA ) 5 MG tablet, lithium  carbonate (ESKALITH ) 450 MG ER tablet,  Dextromethorphan-buPROPion ER (AUVELITY ) 45-105 MG TBCR  Generalized anxiety disorder - Plan: propranolol  ER (INDERAL  LA) 60 MG 24 hr capsule, Vilazodone  HCl (VIIBRYD ) 40 MG TABS, OLANZapine  (ZYPREXA ) 5 MG tablet  Obstructive sleep apnea  Hypersomnolence  Diffuse pain  Lithium -induced tremor  Low vitamin D  level   Deborah Soto has chronic severe major depression and generalized anxiety that is treatment resistant and failed multiple medications ECT and TMS as noted above including SSRIs, try cyclic's, lithium , and atypical antipsychotics for depression.. She has had partial benefit from amitriptyline  plus lithium .  However  tremor problems from the lithium .  Efforts to reduce the lithium  have led to worsening psychiatric symptoms.  There has been a discussion about possible bipolar elements but she has no clear manic episodes unrelated to medication changes.  She has features of atypical depression.  So an MAO inhibitor is attractive from that perspective. Not sig depressed and numbness better.  Good response.  Doing well then.   Greatly relieved from the years of waking angry at being alive.  No longer feels that way.  She is continuing Spravato.  Had Done well with reduced olanzapine  to 2.5 mg HS for TRD until worsening sleep, and needed increase for sleep to 5 mg HS For anxiety and depression.  Consider switch and retry pramipexole and push dose. Less numb  Statistically speaking the most potentially beneficial alternative option would be Parnate or Nardil but they are difficult to use   Need to take Viibryd  60 with food emphasized.   Worse impulisivity with reduced lithium  at 300.   Had a rage reaction after reducing lithium  to less lithium  and better with incr back to  450 MG.  No SE difference.    No tremor px.    continue Auvelity  1 twice daily BC partial benefit Disc SE.   Her improvement seems to correlate with starting this.  continu counseling.  Good benefit seeing Medford Conner.. Counseling 20 min: Great progress dealing with childhood issues.  Helped current perspective.  Read letter in office written to parents, letting go and forgiveness themes. Encourage physical activity and weight loss.  She is not particularly motivated and feels that is hard to accomplish because of chronic pain.  Chronic pain complicates her treatment of depression.   Lyrica  helps pain.  Recent B12 deficiency dx and treatment started. vitamin D  bc level 31 on 2000U daily  Take vitamin D  regularly.  continue: Auvelity  BID,  Continue lithium  450 mg HS  olanzapine  5 mg nightly,  Wean off propranolol  ER over 4-6 weeks and bc no tremor.  And hopefully improve energy  Viibryd  60 mg daily, vitamin D  50000 units weekly.    Spravato twice weekly.  Follow-up 3-4 mos  Lorene Macintosh MD, [name]  Future Appointments  Date Time Provider Department Center  02/03/2024  9:00 AM Cottle, [name] Raddle., MD [facility] None  02/21/2024  9:20 AM [place] HERO, MD [facility]     No orders of the defined types were placed in this encounter.      -------------------------------

## 2023-10-07 DIAGNOSIS — F332 Major depressive disorder, recurrent severe without psychotic features: Secondary | ICD-10-CM | POA: Diagnosis not present

## 2023-10-08 DIAGNOSIS — F332 Major depressive disorder, recurrent severe without psychotic features: Secondary | ICD-10-CM | POA: Diagnosis not present

## 2023-10-15 ENCOUNTER — Other Ambulatory Visit: Payer: Self-pay | Admitting: Psychiatry

## 2023-10-15 DIAGNOSIS — F411 Generalized anxiety disorder: Secondary | ICD-10-CM

## 2023-10-15 DIAGNOSIS — F332 Major depressive disorder, recurrent severe without psychotic features: Secondary | ICD-10-CM | POA: Diagnosis not present

## 2023-10-16 ENCOUNTER — Other Ambulatory Visit: Payer: Self-pay | Admitting: Family Medicine

## 2023-10-17 ENCOUNTER — Other Ambulatory Visit: Payer: Self-pay | Admitting: Psychiatry

## 2023-10-17 DIAGNOSIS — F411 Generalized anxiety disorder: Secondary | ICD-10-CM

## 2023-10-22 DIAGNOSIS — F332 Major depressive disorder, recurrent severe without psychotic features: Secondary | ICD-10-CM | POA: Diagnosis not present

## 2023-10-29 DIAGNOSIS — F332 Major depressive disorder, recurrent severe without psychotic features: Secondary | ICD-10-CM | POA: Diagnosis not present

## 2023-11-01 DIAGNOSIS — F332 Major depressive disorder, recurrent severe without psychotic features: Secondary | ICD-10-CM | POA: Diagnosis not present

## 2023-11-05 DIAGNOSIS — F332 Major depressive disorder, recurrent severe without psychotic features: Secondary | ICD-10-CM | POA: Diagnosis not present

## 2023-11-08 ENCOUNTER — Other Ambulatory Visit: Payer: Self-pay | Admitting: Psychiatry

## 2023-11-12 DIAGNOSIS — F332 Major depressive disorder, recurrent severe without psychotic features: Secondary | ICD-10-CM | POA: Diagnosis not present

## 2023-11-13 ENCOUNTER — Other Ambulatory Visit: Payer: Self-pay

## 2023-11-13 ENCOUNTER — Telehealth: Payer: Self-pay | Admitting: Psychiatry

## 2023-11-13 DIAGNOSIS — F411 Generalized anxiety disorder: Secondary | ICD-10-CM

## 2023-11-13 MED ORDER — PROPRANOLOL HCL ER 60 MG PO CP24: 60.0000 mg | ORAL_CAPSULE | Freq: Every day | ORAL | 2 refills | Status: DC

## 2023-11-13 NOTE — Telephone Encounter (Signed)
 Ok to resume low dose propranolol 

## 2023-11-13 NOTE — Telephone Encounter (Signed)
 Pt reporting coming off the propranolol  last week. She said this was because she wanted one less pill to take, note mentioned not having any tremors.  She is reporting waking up anxious and shaky, increased heart rate. She also reports not sleeping well. She said she realized that she was having withdrawal sx and would rather take an extra pill than deal with the sx. I will pend RF separately for your review.

## 2023-11-13 NOTE — Telephone Encounter (Signed)
 Pt called at 2:37p stating that Dr Geoffry was trying to wean her off of Propranolol , but she didn't do well.  She is requesting 60mg  refill to   [facility] [place], [place] - [address] [place] [address] Phone: [phone]  Fax: [phone]   Next appt 11/24

## 2023-11-18 ENCOUNTER — Ambulatory Visit: Admitting: Family Medicine

## 2023-11-18 ENCOUNTER — Other Ambulatory Visit: Payer: Self-pay | Admitting: Family Medicine

## 2023-11-18 ENCOUNTER — Encounter: Payer: Self-pay | Admitting: Family Medicine

## 2023-11-18 ENCOUNTER — Other Ambulatory Visit: Payer: Self-pay

## 2023-11-18 VITALS — BP 110/72 | HR 85 | Ht 68.0 in | Wt 208.0 lb

## 2023-11-18 DIAGNOSIS — M25551 Pain in right hip: Secondary | ICD-10-CM

## 2023-11-18 NOTE — Patient Instructions (Addendum)
 Thank you for coming in today.   We are ordering an MRI for you today.  The imaging office will be calling you to schedule your appointment after we obtain authorization from your insurance company.  If you have not heard from [facility] within a week, please call [phone] to schedule your MRI.   Please be sure you have signed up for MyChart so that we can get your results to you.  We will be in touch with you as soon as we can.  Please know, it can take up to 3-4 business days for the radiologist and Dr. Joane to have time to review the results and determine the best plan of care.  If there is something that appears to be surgical or needs a referral to other specialists we will let you know through MyChart or telephone.  Otherwise we will plan to schedule a follow up appointment with Dr. Joane once we have the results.    See you back for MRI review.

## 2023-11-18 NOTE — Progress Notes (Signed)
   I, Deborah Soto, CMA acting as a scribe for Deborah Lloyd, MD.  Deborah Soto is a 58 y.o. female who presents to [facility] today for cont'd R hip pain. Pt was last seen by for her hip on 08/07/23 and was given a R GT steroid injection.   Today, pt c/o cont'd R hip pain, minimal relief with last injection. . Feels like sx are worsening. Sx worse when first standing, improves some when starting to walk. Also starting to have some pain in the left hip, not nearly as intense as the right. Denies new sx in the right hip. Has been taking Celebrex  daily, along with Pantoprazole .   She completed physical therapy for this problem about a year ago.  She has been doing home exercise program previously taught with physical therapy over the last 3 months without much benefit.  She notes continued right lateral hip pain worse with activity better with rest.  Dx imaging: 07/19/22 R hip XR             05/04/20 L-spine MRI             03/14/20 L-spine XR  Pertinent review of systems: No fevers or chills  Relevant historical information: Diabetes   Exam:  BP 110/72   Pulse 85   Ht 5' 8 (1.727 m)   Wt 208 lb (94.3 kg)   LMP  (LMP Unknown) Comment: last period ---  1 year ago (?)  SpO2 97%   BMI 31.63 kg/m  General: Well Developed, well nourished, and in no acute distress.   MSK: Right hip tender palpation greater trochanter.  Decreased strength abduction.    Lab and Radiology Results  EXAM: DG HIP (WITH OR WITHOUT PELVIS) 2-3V RIGHT   COMPARISON:  12/12/2016   FINDINGS: Minimal symmetric degenerative changes of the hips. No evidence of acute fracture or dislocation. No focal bony abnormality. Mild degenerative changes of the spine.   IMPRESSION: 1. No acute findings. 2. Minimal symmetric degenerative change of the hips.     Electronically Signed   By: Toribio Agreste M.D.   On: 07/24/2022 15:05 I, Deborah Soto, personally (independently) visualized  and performed the interpretation of the images attached in this note.      Assessment and Plan: 58 y.o. 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  She has failed to improve with typical conservative management including repeated steroid injections, PT, home exercise program.  X-ray was unrevealing.  At this point we will proceed to MRI to further characterize source of pain and for either surgery or potential injection planning or perhaps a different physical therapy option.   PDMP not reviewed this encounter. Orders Placed This Encounter  Procedures   MR HIP RIGHT WO CONTRAST    Standing Status:   Future    Expiration Date:   12/18/2023    What is the patient's sedation requirement?:   No Sedation    Does the patient have a pacemaker or implanted devices?:   No    Preferred imaging location?:   [facility] (table limit-550lbs)   No orders of the defined types were placed in this encounter.    Discussed warning signs or symptoms. Please see discharge instructions. Patient expresses understanding.   The above documentation has been reviewed and is accurate and complete Deborah Soto, M.D.

## 2023-11-19 ENCOUNTER — Other Ambulatory Visit: Payer: Self-pay | Admitting: Psychiatry

## 2023-11-19 DIAGNOSIS — F339 Major depressive disorder, recurrent, unspecified: Secondary | ICD-10-CM

## 2023-11-19 DIAGNOSIS — F332 Major depressive disorder, recurrent severe without psychotic features: Secondary | ICD-10-CM | POA: Diagnosis not present

## 2023-11-20 DIAGNOSIS — F332 Major depressive disorder, recurrent severe without psychotic features: Secondary | ICD-10-CM | POA: Diagnosis not present

## 2023-11-26 DIAGNOSIS — F332 Major depressive disorder, recurrent severe without psychotic features: Secondary | ICD-10-CM | POA: Diagnosis not present

## 2023-11-29 ENCOUNTER — Ambulatory Visit
Admission: RE | Admit: 2023-11-29 | Discharge: 2023-11-29 | Disposition: A | Discharge status: Home / Self Care | Source: Ambulatory Visit | Attending: Family Medicine | Admitting: Family Medicine

## 2023-11-29 DIAGNOSIS — M25551 Pain in right hip: Secondary | ICD-10-CM

## 2023-12-02 ENCOUNTER — Ambulatory Visit: Payer: Self-pay | Admitting: Family Medicine

## 2023-12-02 DIAGNOSIS — M7061 Trochanteric bursitis, right hip: Secondary | ICD-10-CM

## 2023-12-02 DIAGNOSIS — F332 Major depressive disorder, recurrent severe without psychotic features: Secondary | ICD-10-CM | POA: Diagnosis not present

## 2023-12-02 DIAGNOSIS — M25551 Pain in right hip: Secondary | ICD-10-CM

## 2023-12-02 NOTE — Progress Notes (Signed)
 Right hip MRI shows tendinitis.  It may be worthwhile having a retrial of physical therapy.  What your thoughts?

## 2023-12-03 DIAGNOSIS — F332 Major depressive disorder, recurrent severe without psychotic features: Secondary | ICD-10-CM | POA: Diagnosis not present

## 2023-12-10 DIAGNOSIS — F332 Major depressive disorder, recurrent severe without psychotic features: Secondary | ICD-10-CM | POA: Diagnosis not present

## 2023-12-11 ENCOUNTER — Other Ambulatory Visit: Payer: Self-pay | Admitting: Family Medicine

## 2023-12-16 ENCOUNTER — Ambulatory Visit: Admitting: Physical Therapy

## 2023-12-16 ENCOUNTER — Telehealth: Payer: Self-pay | Admitting: Psychiatry

## 2023-12-16 ENCOUNTER — Encounter: Payer: Self-pay | Admitting: Physical Therapy

## 2023-12-16 DIAGNOSIS — M25552 Pain in left hip: Secondary | ICD-10-CM | POA: Diagnosis not present

## 2023-12-16 DIAGNOSIS — M25551 Pain in right hip: Secondary | ICD-10-CM | POA: Diagnosis not present

## 2023-12-16 NOTE — Telephone Encounter (Signed)
 Pt called and said that she needs a new script for a new CPAP machine with diagnosis. Please fax script to [facility] [phone]

## 2023-12-16 NOTE — Therapy (Unsigned)
 OUTPATIENT PHYSICAL THERAPY LOWER EXTREMITY EVALUATION   Patient Name: Deborah Soto MRN: [id number] DOB:Nov 26, 1965, 58 y.o., female Today's Date: 12/16/2023  END OF SESSION:  PT End of Session - 12/16/23 1027     Visit Number 1    Number of Visits 16    Date for Recertification  02/10/24    Authorization Type BCBS    PT Start Time 1015    PT Stop Time 1100    PT Time Calculation (min) 45 min    Activity Tolerance Patient tolerated treatment well    Behavior During Therapy [facility] for tasks assessed/performed           Past Medical History:  Diagnosis Date   Anemia    Anxiety    Asthma    Depression    Diabetes mellitus type 2 in obese 06/11/2016   Dyspnea    Embolism - blood clot January 1997 & January 2017   Also had LLE DVT in 1997   History of kidney stones    Hypertension    Preeclampsia 10/10/2011   1999    Sleep apnea    Past Surgical History:  Procedure Laterality Date   ANTERIOR CERVICAL DECOMPRESSION/DISCECTOMY FUSION 4 LEVELS N/A 08/17/2020   Procedure: ANTERIOR CERVICAL DECOMPRESSION FUSION CERVICAL THREE - CERVICAL FOUR, CERVICAL FOUR- CERVICAL FIVE, CERVICAL FIVE- CERVICAL SIX WITH INSTRUMENTATION AND ALLOGRAFT;  Surgeon: Beuford Anes, MD;  Location: [facility] OR;  Service: Orthopedics;  Laterality: N/A;   KIDNEY STONE SURGERY     Patient Active Problem List   Diagnosis Date Noted   Radiculopathy 08/17/2020   Rash 07/28/2020   Vitamin D  deficiency 05/03/2020   Neuropathy 04/06/2020   S/P ECT (electroconvulsive therapy) 06/02/2019   Memory loss due to medical condition 06/02/2019   Intention tremor 06/02/2019   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   OSA on CPAP 01/26/2019   Long term use of drug 10/08/2018   Insomnia 10/08/2018   Anxiety 10/08/2018   Tremors of nervous system 10/08/2018   Acne 01/10/2018   Primary hypercoagulable state 10/10/2017   Vitamin B12 deficiency 03/18/2017    Diffuse pain 02/22/2017   Morbid obesity (HCC) 06/22/2016   MDD (major depressive disorder), recurrent severe, without psychosis (HCC) 06/14/2016   Type 2 diabetes mellitus with obesity 06/11/2016   Positive ANA (antinuclear antibody) 04/02/2016   Hyperparathyroidism 01/09/2016   Cervical disc disorder with radiculopathy of cervical region 05/12/2015   Hepatic steatosis 04/18/2015   Achilles tendonitis 04/08/2012   PTE (pulmonary thromboembolism) (HCC) 10/10/2011   Affective disorder 10/10/2011   AR (allergic rhinitis) 10/10/2011    PCP: Worth Kitty  REFERRING PROVIDER: [name]   REFERRING DIAG: R hip pain   THERAPY DIAG:  Pain in right hip  Pain in left hip  Rationale for Evaluation and Treatment: Rehabilitation  ONSET DATE:   SUBJECTIVE:   SUBJECTIVE STATEMENT:  Pt states ongoing Pain bilateral hips  R>L. Started about 1 year ago. Did have PT previously for R hip pain, which did improve some.  States Increased pain with initial standing/walking.  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   PERTINENT HISTORY: DM, HTN,    PAIN:  Are you having pain? Yes: NPRS scale: up to 8 /10 Pain location: R hip>  L  Pain description: sore Aggravating factors: getting up from sitting position, sitting too long. By the end of the day, trying to get to sleep .  Relieving factors: rest  PRECAUTIONS: None  WEIGHT BEARING RESTRICTIONS: No  FALLS:  Has patient fallen in last 6 months? No   PLOF: Independent  PATIENT GOALS: decreased pain in hips   NEXT MD VISIT:   OBJECTIVE:   DIAGNOSTIC FINDINGS:    PATIENT SURVEYS:    COGNITION: Overall cognitive status: Within functional limits for tasks assessed     SENSATION:   EDEMA: none   POSTURE: rounded shoulders and forward head   PALPATION:  tenderness to bil  gr troch and glute med    LOWER EXTREMITY ROM:  Hip ROM: WFL, hypermobile ER  Knees:  WFL  LOWER EXTREMITY MMT:  MMT Right eval Left eval  Hip flexion 4- 4-  Hip extension    Hip abduction 4 4  Hip adduction    Hip internal rotation 4 4  Hip external rotation 4 4  Knee flexion 5 5  Knee extension 5 5  Ankle dorsiflexion    Ankle plantarflexion    Ankle inversion    Ankle eversion     (Blank rows = not tested)  LOWER EXTREMITY SPECIAL TESTS:   GAIT:  unremarkable     TODAY'S TREATMENT:                                                                                                                              DATE:   12/16/2023 Eval:  Ther ex: see below for HEP.    PATIENT EDUCATION:  Education details: PT POC, Exam findings, HEP Person educated: Patient Education method: Explanation, Demonstration, Tactile cues, Verbal cues, and Handouts Education comprehension: verbalized understanding, returned demonstration, verbal cues required, tactile cues required, and needs further education   HOME EXERCISE PROGRAM: Access Code: [email or web address]  old  Access Code: [email or web address]  new URL: [email or web address] Date: 12/16/2023 Prepared by: Tinnie Don  Exercises - Clamshell  - 1 x daily - 1-2 sets - 10 reps - Sidelying Hip Abduction  - 1 x daily - 2 sets - 10 reps - Supine Bridge  - 1 x daily - 5 x weekly - 2 sets - 10 reps   ASSESSMENT:  CLINICAL IMPRESSION: Eval: Patient presents with primary complaint of increased pain in bilateral hips,  R>L, consistent with bursitis. She has tenderness in gr trochanter and glute med,  as well as weakness in both hips. She has decreased ability for full functional activities due to pain. She had improvements with PT in the past, and  will benefit from skilled PT to improve deficits and pain.   OBJECTIVE IMPAIRMENTS: decreased activity tolerance, decreased mobility, decreased strength, increased muscle spasms, improper body mechanics, and pain.   ACTIVITY LIMITATIONS: bending, standing, squatting, stairs,  transfers, and locomotion level  PARTICIPATION LIMITATIONS: cleaning, laundry, shopping, and community activity  PERSONAL FACTORS: Time since onset of injury/illness/exacerbation are also affecting patient's functional outcome.   REHAB POTENTIAL: Good  CLINICAL DECISION MAKING: Stable/uncomplicated  EVALUATION COMPLEXITY: Low   GOALS: Goals reviewed  with patient? Yes  SHORT TERM GOALS: Target date: 01/06/2024  Pt to be independent with initial HEP  Goal status: INITIAL   LONG TERM GOALS: Target date: 02/10/2024   Pt to be independent with final HEP  Goal status: INITIAL  2.  Pt to report decreased pain in R hip to 0-2/10 with standing activity, walking, transfers, and work duties.   Goal status: INITIAL  3.  Pt to demo improved strength of bil hips to at least 4+/5 to improve stability and pain.   Goal status: INITIAL  4.  Pt to demo safe ability for navigating at least 5 steps, without hand rail, to improve ability for community navigation.   Goal status: INITIAL   PLAN:  PT FREQUENCY: 1-2x/week  PT DURATION: 8 weeks  PLANNED INTERVENTIONS: Therapeutic exercises, Therapeutic activity, Neuromuscular re-education, Patient/Family education, Self Care, Joint mobilization, Joint manipulation, Stair training, Orthotic/Fit training, DME instructions, Aquatic Therapy, Dry Needling, Electrical stimulation, Cryotherapy, Moist heat, Taping, Ultrasound, Ionotophoresis 4mg /ml Dexamethasone , Manual therapy,  Vasopneumatic device, Traction, Spinal manipulation, Spinal mobilization,Balance training, Gait training,   PLAN FOR NEXT SESSION:   Hip strength, stairs, ionto  Tinnie Don, PT, DPT 8:19 AM  12/17/23

## 2023-12-17 ENCOUNTER — Encounter: Payer: Self-pay | Admitting: Physical Therapy

## 2023-12-17 DIAGNOSIS — F332 Major depressive disorder, recurrent severe without psychotic features: Secondary | ICD-10-CM | POA: Diagnosis not present

## 2023-12-18 DIAGNOSIS — I10 Essential (primary) hypertension: Secondary | ICD-10-CM | POA: Diagnosis not present

## 2023-12-23 ENCOUNTER — Encounter: Admitting: Physical Therapy

## 2023-12-24 DIAGNOSIS — F332 Major depressive disorder, recurrent severe without psychotic features: Secondary | ICD-10-CM | POA: Diagnosis not present

## 2023-12-26 ENCOUNTER — Ambulatory Visit: Admitting: Physical Therapy

## 2023-12-26 ENCOUNTER — Encounter: Payer: Self-pay | Admitting: Physical Therapy

## 2023-12-26 DIAGNOSIS — M25551 Pain in right hip: Secondary | ICD-10-CM

## 2023-12-26 DIAGNOSIS — M25552 Pain in left hip: Secondary | ICD-10-CM | POA: Diagnosis not present

## 2023-12-26 NOTE — Therapy (Signed)
 OUTPATIENT PHYSICAL THERAPY LOWER EXTREMITY TREATMENT   Patient Name: Deborah Soto MRN: [id number] DOB:10-15-1965, 58 y.o., female Today's Date: 12/26/2023  END OF SESSION:  PT End of Session - 12/26/23 1011     Visit Number 2    Number of Visits 16    Date for Recertification  02/10/24    Authorization Type BCBS    PT Start Time 0940    PT Stop Time 1018    PT Time Calculation (min) 38 min    Activity Tolerance Patient tolerated treatment well    Behavior During Therapy [facility] for tasks assessed/performed            Past Medical History:  Diagnosis Date   Anemia    Anxiety    Asthma    Depression    Diabetes mellitus type 2 in obese 06/11/2016   Dyspnea    Embolism - blood clot January 1997 & January 2017   Also had LLE DVT in 1997   History of kidney stones    Hypertension    Preeclampsia 10/10/2011   1999    Sleep apnea    Past Surgical History:  Procedure Laterality Date   ANTERIOR CERVICAL DECOMPRESSION/DISCECTOMY FUSION 4 LEVELS N/A 08/17/2020   Procedure: ANTERIOR CERVICAL DECOMPRESSION FUSION CERVICAL THREE - CERVICAL FOUR, CERVICAL FOUR- CERVICAL FIVE, CERVICAL FIVE- CERVICAL SIX WITH INSTRUMENTATION AND ALLOGRAFT;  Surgeon: Beuford Anes, MD;  Location: [facility] OR;  Service: Orthopedics;  Laterality: N/A;   KIDNEY STONE SURGERY     Patient Active Problem List   Diagnosis Date Noted   Radiculopathy 08/17/2020   Rash 07/28/2020   Vitamin D  deficiency 05/03/2020   Neuropathy 04/06/2020   S/P ECT (electroconvulsive therapy) 06/02/2019   Memory loss due to medical condition 06/02/2019   Intention tremor 06/02/2019   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   OSA on CPAP 01/26/2019   Long term use of drug 10/08/2018   Insomnia 10/08/2018   Anxiety 10/08/2018   Tremors of nervous system 10/08/2018   Acne 01/10/2018   Primary hypercoagulable state 10/10/2017   Vitamin B12 deficiency  03/18/2017   Diffuse pain 02/22/2017   Morbid obesity (HCC) 06/22/2016   MDD (major depressive disorder), recurrent severe, without psychosis (HCC) 06/14/2016   Type 2 diabetes mellitus with obesity 06/11/2016   Positive ANA (antinuclear antibody) 04/02/2016   Hyperparathyroidism 01/09/2016   Cervical disc disorder with radiculopathy of cervical region 05/12/2015   Hepatic steatosis 04/18/2015   Achilles tendonitis 04/08/2012   PTE (pulmonary thromboembolism) (HCC) 10/10/2011   Affective disorder 10/10/2011   AR (allergic rhinitis) 10/10/2011    PCP: Worth Kitty  REFERRING PROVIDER: [name]   REFERRING DIAG: R hip pain   THERAPY DIAG:  Pain in right hip  Pain in left hip  Rationale for Evaluation and Treatment: Rehabilitation  ONSET DATE:   SUBJECTIVE:   SUBJECTIVE STATEMENT: Pt states much soreness in hip. Usually walking eases pain some, but has been more painful this week.   Eval : Pt states ongoing Pain bilateral hips  R>L. Started about 1 year ago. Did have PT previously for R hip pain, which did improve some.  States Increased pain with initial standing/walking.  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   PERTINENT HISTORY: DM, HTN,    PAIN:  Are you having pain? Yes: NPRS scale: up to 8 /10 Pain location: R hip>  L  Pain description:  sore Aggravating factors: getting up from sitting position, sitting too long. By the end of the day, trying to get to sleep .  Relieving factors: rest  PRECAUTIONS: None  WEIGHT BEARING RESTRICTIONS: No  FALLS:  Has patient fallen in last 6 months? No   PLOF: Independent  PATIENT GOALS: decreased pain in hips   NEXT MD VISIT:   OBJECTIVE:   DIAGNOSTIC FINDINGS:    PATIENT SURVEYS:    COGNITION: Overall cognitive status: Within functional limits for tasks assessed     SENSATION:   EDEMA: none   POSTURE: rounded shoulders and forward  head   PALPATION:  tenderness to bil  gr troch and glute med    LOWER EXTREMITY ROM:  Hip ROM: WFL, hypermobile ER  Knees: WFL  LOWER EXTREMITY MMT:  MMT Right eval Left eval  Hip flexion 4- 4-  Hip extension    Hip abduction 4 4  Hip adduction    Hip internal rotation 4 4  Hip external rotation 4 4  Knee flexion 5 5  Knee extension 5 5  Ankle dorsiflexion    Ankle plantarflexion    Ankle inversion    Ankle eversion     (Blank rows = not tested)  LOWER EXTREMITY SPECIAL TESTS:   GAIT:  unremarkable     TODAY'S TREATMENT:                                                                                                                              DATE:   12/26/2023 Therapeutic Exercise: Aerobic: Supine:   hip ER rom x 15;  hip IR fall ins x 15;   LTR x 10;  S/L: hip abd 2 x 10 bil;  Seated: Standing: Stretches:  Neuromuscular Re-education: Manual Therapy: LAD, post hip mobs, IASTM to bil gr troch and glute med Therapeutic Activity: Self Care:    12/16/2023 Eval:  Ther ex: see below for HEP.    PATIENT EDUCATION:  Education details: PT POC, Exam findings, HEP Person educated: Patient Education method: Explanation, Demonstration, Tactile cues, Verbal cues, and Handouts Education comprehension: verbalized understanding, returned demonstration, verbal cues required, tactile cues required, and needs further education   HOME EXERCISE PROGRAM: Access Code: [email or web address]  old  Access Code: [email or web address]  new URL: [email or web address] Date: 12/16/2023 Prepared by: Tinnie Don  Exercises - Clamshell  - 1 x daily - 1-2 sets - 10 reps - Sidelying Hip Abduction  - 1 x daily - 2 sets - 10 reps - Supine Bridge  - 1 x daily - 5 x weekly - 2 sets - 10 reps   ASSESSMENT:  CLINICAL IMPRESSION: Pt with good ability for exercises. Reviewed mobility to try when painful if walking does not feel good. Manual for pain today, and continued strengthening.  Unable to do ionto due to insurance not covering this service.   Eval: Patient presents with primary complaint of increased pain in bilateral hips,  R>L, consistent with  bursitis. She has tenderness in gr trochanter and glute med,  as well as weakness in both hips. She has decreased ability for full functional activities due to pain. She had improvements with PT in the past, and  will benefit from skilled PT to improve deficits and pain.   OBJECTIVE IMPAIRMENTS: decreased activity tolerance, decreased mobility, decreased strength, increased muscle spasms, improper body mechanics, and pain.   ACTIVITY LIMITATIONS: bending, standing, squatting, stairs, transfers, and locomotion level  PARTICIPATION LIMITATIONS: cleaning, laundry, shopping, and community activity  PERSONAL FACTORS: Time since onset of injury/illness/exacerbation are also affecting patient's functional outcome.   REHAB POTENTIAL: Good  CLINICAL DECISION MAKING: Stable/uncomplicated  EVALUATION COMPLEXITY: Low   GOALS: Goals reviewed with patient? Yes  SHORT TERM GOALS: Target date: 01/06/2024  Pt to be independent with initial HEP  Goal status: INITIAL   LONG TERM GOALS: Target date: 02/10/2024   Pt to be independent with final HEP  Goal status: INITIAL  2.  Pt to report decreased pain in R hip to 0-2/10 with standing activity, walking, transfers, and work duties.   Goal status: INITIAL  3.  Pt to demo improved strength of bil hips to at least 4+/5 to improve stability and pain.   Goal status: INITIAL  4.  Pt to demo safe ability for navigating at least 5 steps, without hand rail, to improve ability for community navigation.   Goal status: INITIAL   PLAN:  PT FREQUENCY: 1-2x/week  PT DURATION: 8 weeks  PLANNED INTERVENTIONS: Therapeutic exercises, Therapeutic activity, Neuromuscular re-education, Patient/Family education, Self Care, Joint mobilization, Joint manipulation, Stair training, Orthotic/Fit  training, DME instructions, Aquatic Therapy, Dry Needling, Electrical stimulation, Cryotherapy, Moist heat, Taping, Ultrasound, Ionotophoresis 4mg /ml Dexamethasone , Manual therapy,  Vasopneumatic device, Traction, Spinal manipulation, Spinal mobilization,Balance training, Gait training,   PLAN FOR NEXT SESSION:   Hip strength, stairs, ionto  Tinnie Don, PT, DPT 11:58 AM  12/26/23

## 2023-12-30 ENCOUNTER — Encounter: Admitting: Physical Therapy

## 2023-12-31 DIAGNOSIS — F332 Major depressive disorder, recurrent severe without psychotic features: Secondary | ICD-10-CM | POA: Diagnosis not present

## 2024-01-03 ENCOUNTER — Encounter: Payer: Self-pay | Admitting: Physical Therapy

## 2024-01-03 ENCOUNTER — Ambulatory Visit: Admitting: Physical Therapy

## 2024-01-03 DIAGNOSIS — M25552 Pain in left hip: Secondary | ICD-10-CM | POA: Diagnosis not present

## 2024-01-03 DIAGNOSIS — M25551 Pain in right hip: Secondary | ICD-10-CM | POA: Diagnosis not present

## 2024-01-03 NOTE — Therapy (Signed)
 OUTPATIENT PHYSICAL THERAPY LOWER EXTREMITY TREATMENT   Patient Name: Deborah Soto MRN: [id number] DOB:June 16, 1965, 58 y.o., female Today's Date: 12/26/2023  END OF SESSION:  PT End of Session - 01/03/24 1005     Visit Number 3    Number of Visits 16    Date for Recertification  02/10/24    Authorization Type BCBS  5 visits through 12/4 approved- (no [place] approved)    PT Start Time 1010    PT Stop Time 1050    PT Time Calculation (min) 40 min    Activity Tolerance Patient tolerated treatment well    Behavior During Therapy [facility] for tasks assessed/performed            Past Medical History:  Diagnosis Date   Anemia    Anxiety    Asthma    Depression    Diabetes mellitus type 2 in obese 06/11/2016   Dyspnea    Embolism - blood clot January 1997 & January 2017   Also had LLE DVT in 1997   History of kidney stones    Hypertension    Preeclampsia 10/10/2011   1999    Sleep apnea    Past Surgical History:  Procedure Laterality Date   ANTERIOR CERVICAL DECOMPRESSION/DISCECTOMY FUSION 4 LEVELS N/A 08/17/2020   Procedure: ANTERIOR CERVICAL DECOMPRESSION FUSION CERVICAL THREE - CERVICAL FOUR, CERVICAL FOUR- CERVICAL FIVE, CERVICAL FIVE- CERVICAL SIX WITH INSTRUMENTATION AND ALLOGRAFT;  Surgeon: Beuford Anes, MD;  Location: [facility] OR;  Service: Orthopedics;  Laterality: N/A;   KIDNEY STONE SURGERY     Patient Active Problem List   Diagnosis Date Noted   Radiculopathy 08/17/2020   Rash 07/28/2020   Vitamin D  deficiency 05/03/2020   Neuropathy 04/06/2020   S/P ECT (electroconvulsive therapy) 06/02/2019   Memory loss due to medical condition 06/02/2019   Intention tremor 06/02/2019   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   OSA on CPAP 01/26/2019   Long term use of drug 10/08/2018   Insomnia 10/08/2018   Anxiety 10/08/2018   Tremors of nervous system 10/08/2018   Acne 01/10/2018   Primary hypercoagulable  state 10/10/2017   Vitamin B12 deficiency 03/18/2017   Diffuse pain 02/22/2017   Morbid obesity (HCC) 06/22/2016   MDD (major depressive disorder), recurrent severe, without psychosis (HCC) 06/14/2016   Type 2 diabetes mellitus with obesity 06/11/2016   Positive ANA (antinuclear antibody) 04/02/2016   Hyperparathyroidism 01/09/2016   Cervical disc disorder with radiculopathy of cervical region 05/12/2015   Hepatic steatosis 04/18/2015   Achilles tendonitis 04/08/2012   PTE (pulmonary thromboembolism) (HCC) 10/10/2011   Affective disorder 10/10/2011   AR (allergic rhinitis) 10/10/2011    PCP: Worth Kitty  REFERRING PROVIDER: [name]   REFERRING DIAG: R hip pain   THERAPY DIAG:  Pain in right hip  Pain in left hip  Rationale for Evaluation and Treatment: Rehabilitation  ONSET DATE:   SUBJECTIVE:   SUBJECTIVE STATEMENT: Pt states mild improvements this week. R still a bit more sore.   Eval : Pt states ongoing Pain bilateral hips  R>L. Started about 1 year ago. Did have PT previously for R hip pain, which did improve some.  States Increased pain with initial standing/walking.  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   PERTINENT HISTORY: DM, HTN,   PAIN:  Are you having pain? Yes: NPRS scale: up to 8 /10 Pain location: R hip>  L  Pain description: sore Aggravating factors: getting up from sitting position, sitting too long. By the end of the day, trying to get to sleep .  Relieving factors: rest  PRECAUTIONS: None  WEIGHT BEARING RESTRICTIONS: No  FALLS:  Has patient fallen in last 6 months? No   PLOF: Independent  PATIENT GOALS: decreased pain in hips   NEXT MD VISIT:   OBJECTIVE:   DIAGNOSTIC FINDINGS:    PATIENT SURVEYS:    COGNITION: Overall cognitive status: Within functional limits for tasks assessed     SENSATION:   EDEMA: none   POSTURE: rounded shoulders and forward  head   PALPATION:  tenderness to bil  gr troch and glute med    LOWER EXTREMITY ROM:  Hip ROM: WFL, hypermobile ER  Knees: WFL  LOWER EXTREMITY MMT:  MMT Right eval Left eval  Hip flexion 4- 4-  Hip extension    Hip abduction 4 4  Hip adduction    Hip internal rotation 4 4  Hip external rotation 4 4  Knee flexion 5 5  Knee extension 5 5  Ankle dorsiflexion    Ankle plantarflexion    Ankle inversion    Ankle eversion     (Blank rows = not tested)  LOWER EXTREMITY SPECIAL TESTS:   GAIT:  unremarkable     TODAY'S TREATMENT:                                                                                                                              DATE:   01/03/2024 Therapeutic Exercise: Aerobic:  Bike L1 x 7 min  Supine:    hip IR fall ins x 15;    Bridging with abd (pilates ring) 2 x 10 bil;  S/L:  Seated:  piriformis stretch x 2 bil - after standing exercises.  Standing:  hip abd 2 x 10 bil;  Stretches:  Neuromuscular Re-education: Manual Therapy: STM/roller to R hip and glute  Therapeutic Activity: Step ups : lateral and fwd 6 in , light UE support x 15 ea  Band walks Gtb 12 ft x4 ;  Self Care:    Previous:  Therapeutic Exercise: Aerobic: Supine:   hip ER rom x 15;  hip IR fall ins x 15;   LTR x 10;  S/L: hip abd 2 x 10 bil;  Seated: Standing: Stretches:  Neuromuscular Re-education: Manual Therapy: LAD, post hip mobs, IASTM to bil gr troch and glute med Therapeutic Activity: Self Care:    12/16/2023 Eval:  Ther ex: see below for HEP.    PATIENT EDUCATION:  Education details: PT POC, Exam findings, HEP Person educated: Patient Education method: Explanation, Demonstration, Tactile cues, Verbal cues, and Handouts Education comprehension: verbalized understanding, returned demonstration, verbal cues required, tactile cues required, and needs further education   HOME EXERCISE PROGRAM: Access Code: [email or web address]  old  Access Code: [email or web address]   new URL: [email or web address] Date: 12/16/2023 Prepared by: Tinnie Don  Exercises - Clamshell  -  1 x daily - 1-2 sets - 10 reps - Sidelying Hip Abduction  - 1 x daily - 2 sets - 10 reps - Supine Bridge  - 1 x daily - 5 x weekly - 2 sets - 10 reps   ASSESSMENT:  CLINICAL IMPRESSION: Pt with good tolerance for exercises. She has much muscle fatigue with activity, but no pain in hips. Plan to continue as tolerated.   Eval: Patient presents with primary complaint of increased pain in bilateral hips,  R>L, consistent with bursitis. She has tenderness in gr trochanter and glute med,  as well as weakness in both hips. She has decreased ability for full functional activities due to pain. She had improvements with PT in the past, and  will benefit from skilled PT to improve deficits and pain.   OBJECTIVE IMPAIRMENTS: decreased activity tolerance, decreased mobility, decreased strength, increased muscle spasms, improper body mechanics, and pain.   ACTIVITY LIMITATIONS: bending, standing, squatting, stairs, transfers, and locomotion level  PARTICIPATION LIMITATIONS: cleaning, laundry, shopping, and community activity  PERSONAL FACTORS: Time since onset of injury/illness/exacerbation are also affecting patient's functional outcome.   REHAB POTENTIAL: Good  CLINICAL DECISION MAKING: Stable/uncomplicated  EVALUATION COMPLEXITY: Low   GOALS: Goals reviewed with patient? Yes  SHORT TERM GOALS: Target date: 01/06/2024  Pt to be independent with initial HEP  Goal status: INITIAL   LONG TERM GOALS: Target date: 02/10/2024   Pt to be independent with final HEP  Goal status: INITIAL  2.  Pt to report decreased pain in R hip to 0-2/10 with standing activity, walking, transfers, and work duties.   Goal status: INITIAL  3.  Pt to demo improved strength of bil hips to at least 4+/5 to improve stability and pain.   Goal status: INITIAL  4.  Pt to demo safe ability  for navigating at least 5 steps, without hand rail, to improve ability for community navigation.   Goal status: INITIAL   PLAN:  PT FREQUENCY: 1-2x/week  PT DURATION: 8 weeks  PLANNED INTERVENTIONS: Therapeutic exercises, Therapeutic activity, Neuromuscular re-education, Patient/Family education, Self Care, Joint mobilization, Joint manipulation, Stair training, Orthotic/Fit training, DME instructions, Aquatic Therapy, Dry Needling, Electrical stimulation, Cryotherapy, Moist heat, Taping, Ultrasound, Ionotophoresis 4mg /ml Dexamethasone , Manual therapy,  Vasopneumatic device, Traction, Spinal manipulation, Spinal mobilization,Balance training, Gait training,   PLAN FOR NEXT SESSION:   Hip strength, stairs,  Tinnie Don, PT, DPT 10:07 AM  01/03/24

## 2024-01-06 ENCOUNTER — Encounter: Admitting: Physical Therapy

## 2024-01-07 DIAGNOSIS — F332 Major depressive disorder, recurrent severe without psychotic features: Secondary | ICD-10-CM | POA: Diagnosis not present

## 2024-01-09 ENCOUNTER — Ambulatory Visit: Admitting: Physical Therapy

## 2024-01-09 ENCOUNTER — Encounter: Payer: Self-pay | Admitting: Physical Therapy

## 2024-01-09 DIAGNOSIS — M25551 Pain in right hip: Secondary | ICD-10-CM | POA: Diagnosis not present

## 2024-01-09 DIAGNOSIS — M25552 Pain in left hip: Secondary | ICD-10-CM

## 2024-01-09 NOTE — Therapy (Signed)
 OUTPATIENT PHYSICAL THERAPY LOWER EXTREMITY TREATMENT   Patient Name: Deborah Soto MRN: [id number] DOB:[birth year], 58 y.o., female Today's Date: 01/09/2024  END OF SESSION:  PT End of Session - 01/09/24 0934     Visit Number 4    Number of Visits 16    Date for Recertification  02/10/24    Authorization Type BCBS  5 visits through 12/4 approved- (no Ionto approved)    PT Start Time 0935    PT Stop Time 1014    PT Time Calculation (min) 39 min    Activity Tolerance Patient tolerated treatment well    Behavior During Therapy [facility] for tasks assessed/performed             Past Medical History:  Diagnosis Date   Anemia    Anxiety    Asthma    Depression    Diabetes mellitus type 2 in obese 06/11/2016   Dyspnea    Embolism - blood clot January 1997 & January 2017   Also had LLE DVT in 1997   History of kidney stones    Hypertension    Preeclampsia 10/10/2011   1999    Sleep apnea    Past Surgical History:  Procedure Laterality Date   ANTERIOR CERVICAL DECOMPRESSION/DISCECTOMY FUSION 4 LEVELS N/A 08/17/2020   Procedure: ANTERIOR CERVICAL DECOMPRESSION FUSION CERVICAL THREE - CERVICAL FOUR, CERVICAL FOUR- CERVICAL FIVE, CERVICAL FIVE- CERVICAL SIX WITH INSTRUMENTATION AND ALLOGRAFT;  Surgeon: Beuford Anes, MD;  Location: [facility] OR;  Service: Orthopedics;  Laterality: N/A;   KIDNEY STONE SURGERY     Patient Active Problem List   Diagnosis Date Noted   Radiculopathy 08/17/2020   Rash 07/28/2020   Vitamin D  deficiency 05/03/2020   Neuropathy 04/06/2020   S/P ECT (electroconvulsive therapy) 06/02/2019   Memory loss due to medical condition 06/02/2019   Intention tremor 06/02/2019   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   OSA on CPAP 01/26/2019   Long term use of drug 10/08/2018   Insomnia 10/08/2018   Anxiety 10/08/2018   Tremors of nervous system 10/08/2018   Acne 01/10/2018   Primary  hypercoagulable state 10/10/2017   Vitamin B12 deficiency 03/18/2017   Diffuse pain 02/22/2017   Morbid obesity (HCC) 06/22/2016   MDD (major depressive disorder), recurrent severe, without psychosis (HCC) 06/14/2016   Type 2 diabetes mellitus with obesity 06/11/2016   Positive ANA (antinuclear antibody) 04/02/2016   Hyperparathyroidism 01/09/2016   Cervical disc disorder with radiculopathy of cervical region 05/12/2015   Hepatic steatosis 04/18/2015   Achilles tendonitis 04/08/2012   PTE (pulmonary thromboembolism) (HCC) 10/10/2011   Affective disorder 10/10/2011   AR (allergic rhinitis) 10/10/2011    PCP: Worth Kitty  REFERRING PROVIDER: [name]   REFERRING DIAG: R hip pain   THERAPY DIAG:  Pain in right hip  Pain in left hip  Rationale for Evaluation and Treatment: Rehabilitation  ONSET DATE:   SUBJECTIVE:   SUBJECTIVE STATEMENT: Pt states 2 days of pain this week, but now better, still doing better overall. Feeling better at night, sleeping through the night without heat patch.   Eval : Pt states ongoing Pain bilateral hips  R>L. Started about 1 year ago. Did have PT previously for R hip pain, which did improve some.  States Increased pain with initial standing/walking.  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   PERTINENT HISTORY: DM, HTN,   PAIN:  Are you having  pain? Yes: NPRS scale: up to 5 /10 Pain location: R hip>  L  Pain description: sore Aggravating factors: getting up from sitting position, sitting too long. By the end of the day, trying to get to sleep .  Relieving factors: rest  PRECAUTIONS: None  WEIGHT BEARING RESTRICTIONS: No  FALLS:  Has patient fallen in last 6 months? No   PLOF: Independent  PATIENT GOALS: decreased pain in hips   NEXT MD VISIT:   OBJECTIVE:   DIAGNOSTIC FINDINGS:    PATIENT SURVEYS:    COGNITION: Overall cognitive status: Within functional limits  for tasks assessed     SENSATION:   EDEMA: none   POSTURE: rounded shoulders and forward head   PALPATION:  tenderness to bil  gr troch and glute med    LOWER EXTREMITY ROM:  Hip ROM: WFL, hypermobile ER  Knees: WFL  LOWER EXTREMITY MMT:  MMT Right eval Left eval  Hip flexion 4- 4-  Hip extension    Hip abduction 4 4  Hip adduction    Hip internal rotation 4 4  Hip external rotation 4 4  Knee flexion 5 5  Knee extension 5 5  Ankle dorsiflexion    Ankle plantarflexion    Ankle inversion    Ankle eversion     (Blank rows = not tested)  LOWER EXTREMITY SPECIAL TESTS:   GAIT:  unremarkable     TODAY'S TREATMENT:                                                                                                                              DATE:   01/09/2024 Therapeutic Exercise: Aerobic:  Bike L2  x 7 min  Supine:    hip IR fall ins x 10;    Bridging with abd (pilates ring) 2 x 10 bil;  S/L:  review of clams with RTB for hep to tempo with hold  Seated:   Standing:  hip abd 2 x 8  bil YTB ;  Stretches:  Neuromuscular Re-education: Manual Therapy:  Therapeutic Activity: Step ups : lateral and fwd 6 in , light UE support x 15 ea  Band walks Gtb 15 ft x4 ;  Squats at mat table 10lb x 10 (knee pain)  Sit to stand 10lb x 10 Self Care:    Previous:  Therapeutic Exercise: Aerobic: Supine:   hip ER rom x 15;  hip IR fall ins x 15;   LTR x 10;  S/L: hip abd 2 x 10 bil;  Seated: Standing: Stretches:  Neuromuscular Re-education: Manual Therapy: LAD, post hip mobs, IASTM to bil gr troch and glute med Therapeutic Activity: Self Care:    12/16/2023 Eval:  Ther ex: see below for HEP.    PATIENT EDUCATION:  Education details: PT POC, Exam findings, HEP Person educated: Patient Education method: Explanation, Demonstration, Tactile cues, Verbal cues, and Handouts Education comprehension: verbalized understanding, returned demonstration, verbal cues  required, tactile cues required,  and needs further education   HOME EXERCISE PROGRAM: Access Code: [email or web address]  old  Access Code: [email or web address]  new URL: [email or web address] Date: 12/16/2023 Prepared by: Tinnie Don  Exercises - Clamshell  - 1 x daily - 1-2 sets - 10 reps - Sidelying Hip Abduction  - 1 x daily - 2 sets - 10 reps - Supine Bridge  - 1 x daily - 5 x weekly - 2 sets - 10 reps   ASSESSMENT:  CLINICAL IMPRESSION: Pt with good tolerance for exercises. She has  muscle fatigue and soreness especially in quads today,  with activity, but no pain in hips. Plan to continue as tolerated.   Eval: Patient presents with primary complaint of increased pain in bilateral hips,  R>L, consistent with bursitis. She has tenderness in gr trochanter and glute med,  as well as weakness in both hips. She has decreased ability for full functional activities due to pain. She had improvements with PT in the past, and  will benefit from skilled PT to improve deficits and pain.   OBJECTIVE IMPAIRMENTS: decreased activity tolerance, decreased mobility, decreased strength, increased muscle spasms, improper body mechanics, and pain.   ACTIVITY LIMITATIONS: bending, standing, squatting, stairs, transfers, and locomotion level  PARTICIPATION LIMITATIONS: cleaning, laundry, shopping, and community activity  PERSONAL FACTORS: Time since onset of injury/illness/exacerbation are also affecting patient's functional outcome.   REHAB POTENTIAL: Good  CLINICAL DECISION MAKING: Stable/uncomplicated  EVALUATION COMPLEXITY: Low   GOALS: Goals reviewed with patient? Yes  SHORT TERM GOALS: Target date: 01/06/2024  Pt to be independent with initial HEP  Goal status: INITIAL   LONG TERM GOALS: Target date: 02/10/2024   Pt to be independent with final HEP  Goal status: INITIAL  2.  Pt to report decreased pain in R hip to 0-2/10 with standing activity, walking, transfers, and work duties.    Goal status: INITIAL  3.  Pt to demo improved strength of bil hips to at least 4+/5 to improve stability and pain.   Goal status: INITIAL  4.  Pt to demo safe ability for navigating at least 5 steps, without hand rail, to improve ability for community navigation.   Goal status: INITIAL   PLAN:  PT FREQUENCY: 1-2x/week  PT DURATION: 8 weeks  PLANNED INTERVENTIONS: Therapeutic exercises, Therapeutic activity, Neuromuscular re-education, Patient/Family education, Self Care, Joint mobilization, Joint manipulation, Stair training, Orthotic/Fit training, DME instructions, Aquatic Therapy, Dry Needling, Electrical stimulation, Cryotherapy, Moist heat, Taping, Ultrasound, Ionotophoresis 4mg /ml Dexamethasone , Manual therapy,  Vasopneumatic device, Traction, Spinal manipulation, Spinal mobilization,Balance training, Gait training,   PLAN FOR NEXT SESSION:   Hip strength, stairs,  Tinnie Don, PT, DPT 9:34 AM  01/09/24

## 2024-01-10 ENCOUNTER — Other Ambulatory Visit: Payer: Self-pay | Admitting: Family Medicine

## 2024-01-15 ENCOUNTER — Encounter: Payer: Self-pay | Admitting: Family Medicine

## 2024-01-16 ENCOUNTER — Ambulatory Visit: Admitting: Physical Therapy

## 2024-01-16 ENCOUNTER — Encounter: Payer: Self-pay | Admitting: Physical Therapy

## 2024-01-16 DIAGNOSIS — M25552 Pain in left hip: Secondary | ICD-10-CM | POA: Diagnosis not present

## 2024-01-16 DIAGNOSIS — M25551 Pain in right hip: Secondary | ICD-10-CM

## 2024-01-16 NOTE — Therapy (Signed)
 OUTPATIENT PHYSICAL THERAPY LOWER EXTREMITY TREATMENT   Patient Name: Deborah Soto MRN: [id number] DOB:03-Nov-1965, 58 y.o., female Today's Date: 01/16/2024  END OF SESSION:  PT End of Session - 01/16/24 1429     Visit Number 5    Number of Visits 16    Date for Recertification  02/10/24    Authorization Type BCBS  5 visits through 12/4 approved- (no Ionto approved)    PT Start Time 1430    PT Stop Time 1510    PT Time Calculation (min) 40 min    Activity Tolerance Patient tolerated treatment well    Behavior During Therapy [facility] for tasks assessed/performed             Past Medical History:  Diagnosis Date   Anemia    Anxiety    Asthma    Depression    Diabetes mellitus type 2 in obese 06/11/2016   Dyspnea    Embolism - blood clot January 1997 & January 2017   Also had LLE DVT in 1997   History of kidney stones    Hypertension    Preeclampsia 10/10/2011   1999    Sleep apnea    Past Surgical History:  Procedure Laterality Date   ANTERIOR CERVICAL DECOMPRESSION/DISCECTOMY FUSION 4 LEVELS N/A 08/17/2020   Procedure: ANTERIOR CERVICAL DECOMPRESSION FUSION CERVICAL THREE - CERVICAL FOUR, CERVICAL FOUR- CERVICAL FIVE, CERVICAL FIVE- CERVICAL SIX WITH INSTRUMENTATION AND ALLOGRAFT;  Surgeon: Beuford Anes, MD;  Location: [facility] OR;  Service: Orthopedics;  Laterality: N/A;   KIDNEY STONE SURGERY     Patient Active Problem List   Diagnosis Date Noted   Radiculopathy 08/17/2020   Rash 07/28/2020   Vitamin D  deficiency 05/03/2020   Neuropathy 04/06/2020   S/P ECT (electroconvulsive therapy) 06/02/2019   Memory loss due to medical condition 06/02/2019   Intention tremor 06/02/2019   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   OSA on CPAP 01/26/2019   Long term use of drug 10/08/2018   Insomnia 10/08/2018   Anxiety 10/08/2018   Tremors of nervous system 10/08/2018   Acne 01/10/2018   Primary  hypercoagulable state 10/10/2017   Vitamin B12 deficiency 03/18/2017   Diffuse pain 02/22/2017   Morbid obesity (HCC) 06/22/2016   MDD (major depressive disorder), recurrent severe, without psychosis (HCC) 06/14/2016   Type 2 diabetes mellitus with obesity 06/11/2016   Positive ANA (antinuclear antibody) 04/02/2016   Hyperparathyroidism 01/09/2016   Cervical disc disorder with radiculopathy of cervical region 05/12/2015   Hepatic steatosis 04/18/2015   Achilles tendonitis 04/08/2012   PTE (pulmonary thromboembolism) (HCC) 10/10/2011   Affective disorder 10/10/2011   AR (allergic rhinitis) 10/10/2011    PCP: Worth Kitty  REFERRING PROVIDER: [name]   REFERRING DIAG: R hip pain   THERAPY DIAG:  Pain in right hip  Pain in left hip  Rationale for Evaluation and Treatment: Rehabilitation  ONSET DATE:   SUBJECTIVE:   SUBJECTIVE STATEMENT: Pt states increased pain with more activity and at the end of the day.   Eval : Pt states ongoing Pain bilateral hips  R>L. Started about 1 year ago. Did have PT previously for R hip pain, which did improve some.  States Increased pain with initial standing/walking.  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   PERTINENT HISTORY: DM, HTN,   PAIN:  Are you having pain? Yes: NPRS scale: up to 5 /10 Pain location: R hip>  L  Pain description: sore Aggravating factors: getting up from sitting position, sitting too long. By the end of the day, trying to get to sleep .  Relieving factors: rest  PRECAUTIONS: None  WEIGHT BEARING RESTRICTIONS: No  FALLS:  Has patient fallen in last 6 months? No   PLOF: Independent  PATIENT GOALS: decreased pain in hips   NEXT MD VISIT:   OBJECTIVE:   DIAGNOSTIC FINDINGS:    PATIENT SURVEYS:    COGNITION: Overall cognitive status: Within functional limits for tasks assessed     SENSATION:   EDEMA: none   POSTURE: rounded  shoulders and forward head   PALPATION:  tenderness to bil  gr troch and glute med    LOWER EXTREMITY ROM:  Hip ROM: WFL, hypermobile ER  Knees: WFL  LOWER EXTREMITY MMT:  MMT Right eval Left eval Right Left   Hip flexion 4- 4- 4+ 4+  Hip extension      Hip abduction 4 4 4+ 4+  Hip adduction      Hip internal rotation 4 4    Hip external rotation 4 4    Knee flexion 5 5    Knee extension 5 5    Ankle dorsiflexion      Ankle plantarflexion      Ankle inversion      Ankle eversion       (Blank rows = not tested)  LOWER EXTREMITY SPECIAL TESTS:   GAIT:  unremarkable     TODAY'S TREATMENT:                                                                                                                              DATE:   01/16/2024 Therapeutic Exercise: Aerobic:  Bike L2  x 7 min  Supine:   hip IR fall ins x 10;    Bridging with abd (pilates ring) 2 x 10 bil;  S/L:  clams with RTB for hep to tempo with hold  Seated:   Standing:  hip abd and ext 2 x 10  bil  Stretches:  Neuromuscular Re-education: Manual Therapy:  Therapeutic Activity: Squats at chair 2 x 10   Sit to stand  x 10 Self Care:   Therapeutic Exercise: Aerobic:  Bike L2  x 7 min  Supine:    hip IR fall ins x 10;    Bridging with abd (pilates ring) 2 x 10 bil;  S/L:  review of clams with RTB for hep to tempo with hold  Seated:   Standing:  hip abd 2 x 8  bil YTB ;  Stretches:  Neuromuscular Re-education: Manual Therapy:  Therapeutic Activity: Step ups : lateral and fwd 6 in , light UE support x 15 ea  Band walks Gtb 15 ft x4 ;  Squats at mat table 10lb x 10 (knee pain)  Sit to stand 10lb x 10 Self Care:    Previous:  Therapeutic Exercise: Aerobic: Supine:  hip ER rom x 15;  hip IR fall ins x 15;   LTR x 10;  S/L: hip abd 2 x 10 bil;  Seated: Standing: Stretches:  Neuromuscular Re-education: Manual Therapy: LAD, post hip mobs, IASTM to bil gr troch and glute med Therapeutic  Activity: Self Care:    12/16/2023 Eval:  Ther ex: see below for HEP.    PATIENT EDUCATION:  Education details: PT POC, Exam findings, HEP Person educated: Patient Education method: Explanation, Demonstration, Tactile cues, Verbal cues, and Handouts Education comprehension: verbalized understanding, returned demonstration, verbal cues required, tactile cues required, and needs further education   HOME EXERCISE PROGRAM: Access Code: [email or web address]  old  Access Code: [email or web address]  new URL: [email or web address] Date: 12/16/2023 Prepared by: Tinnie Don  Exercises - Clamshell  - 1 x daily - 1-2 sets - 10 reps - Sidelying Hip Abduction  - 1 x daily - 2 sets - 10 reps - Supine Bridge  - 1 x daily - 5 x weekly - 2 sets - 10 reps   ASSESSMENT:  CLINICAL IMPRESSION: Pt with good tolerance for exercises. She continues to have pain in hips that has not been significantly improved. She will return to MD for possible shock wave. She may require continued PT in future to continue strengthening and pain relief. Reviewed HEP and best strengthening today.   Eval: Patient presents with primary complaint of increased pain in bilateral hips,  R>L, consistent with bursitis. She has tenderness in gr trochanter and glute med,  as well as weakness in both hips. She has decreased ability for full functional activities due to pain. She had improvements with PT in the past, and  will benefit from skilled PT to improve deficits and pain.   OBJECTIVE IMPAIRMENTS: decreased activity tolerance, decreased mobility, decreased strength, increased muscle spasms, improper body mechanics, and pain.   ACTIVITY LIMITATIONS: bending, standing, squatting, stairs, transfers, and locomotion level  PARTICIPATION LIMITATIONS: cleaning, laundry, shopping, and community activity  PERSONAL FACTORS: Time since onset of injury/illness/exacerbation are also affecting patient's functional outcome.   REHAB  POTENTIAL: Good  CLINICAL DECISION MAKING: Stable/uncomplicated  EVALUATION COMPLEXITY: Low   GOALS: Goals reviewed with patient? Yes  SHORT TERM GOALS: Target date: 01/06/2024  Pt to be independent with initial HEP  Goal status: INITIAL   LONG TERM GOALS: Target date: 02/10/2024   Pt to be independent with final HEP  Goal status: INITIAL  2.  Pt to report decreased pain in R hip to 0-2/10 with standing activity, walking, transfers, and work duties.   Goal status: INITIAL  3.  Pt to demo improved strength of bil hips to at least 4+/5 to improve stability and pain.   Goal status: INITIAL  4.  Pt to demo safe ability for navigating at least 5 steps, without hand rail, to improve ability for community navigation.   Goal status: INITIAL   PLAN:  PT FREQUENCY: 1-2x/week  PT DURATION: 8 weeks  PLANNED INTERVENTIONS: Therapeutic exercises, Therapeutic activity, Neuromuscular re-education, Patient/Family education, Self Care, Joint mobilization, Joint manipulation, Stair training, Orthotic/Fit training, DME instructions, Aquatic Therapy, Dry Needling, Electrical stimulation, Cryotherapy, Moist heat, Taping, Ultrasound, Ionotophoresis 4mg /ml Dexamethasone , Manual therapy,  Vasopneumatic device, Traction, Spinal manipulation, Spinal mobilization,Balance training, Gait training,   PLAN FOR NEXT SESSION:   Hip strength, stairs,  Tinnie Don, PT, DPT 3:13 PM  01/16/24

## 2024-01-21 DIAGNOSIS — F332 Major depressive disorder, recurrent severe without psychotic features: Secondary | ICD-10-CM | POA: Diagnosis not present

## 2024-01-27 ENCOUNTER — Ambulatory Visit: Admitting: Family Medicine

## 2024-01-27 VITALS — BP 112/82 | HR 92 | Ht 68.0 in | Wt 209.0 lb

## 2024-01-27 DIAGNOSIS — M25551 Pain in right hip: Secondary | ICD-10-CM | POA: Diagnosis not present

## 2024-01-27 DIAGNOSIS — M7061 Trochanteric bursitis, right hip: Secondary | ICD-10-CM | POA: Diagnosis not present

## 2024-01-27 NOTE — Patient Instructions (Addendum)
 Thank you for coming in today.   Schedule your 2nd shockwave treatment for next week.

## 2024-01-27 NOTE — Progress Notes (Signed)
   LILLETTE Ileana Collet, PhD, LAT, ATC acting as a scribe for [name], MD.  Deborah Soto is a 58 y.o. female who presents to [facility] at [facility] today for f/u R hip pain w/ MRI review. Pt was last seen by Dr. Lloyd on 11/18/23 and a MRI was ordered.   Pt was later referred to PT, completing 5 visits.  Today, pt reports PT is helping some. Slightly better in the morning and then will progressively worsening as the day goes. She also c/o an achyness in her legs.  Dx imaging: 11/29/23 R hip MRI  07/19/22 R hip XR  Pertinent review of systems: No fevers or chills  Relevant historical information: Diabetes.  History of pulmonary embolism.   Exam:  BP 112/82   Pulse 92   Ht 5' 8 (1.727 m)   Wt 209 lb (94.8 kg)   LMP  (LMP Unknown) Comment: last period ---  1 year ago (?)  SpO2 98%   BMI 31.78 kg/m  General: Well Developed, well nourished, and in no acute distress.   MSK: Right hip tender palpation greater trochanter.  Hip abduction strength is reduced.                  Extracorporeal Shockwave Therapy Note    Patient is being treated today with ECSWT. Informed consent was obtained and patient tolerated procedure well.   Therapy performed by [name]  Condition treated: Right greater trochanter Treatment preset used: Tendinitis Energy used: 90 mJ Frequency used: 15 Hz Number of pulses: 2000 Head Size: Medium Treatment #1 of #4      Assessment and Plan: 58 y.o. female with right greater trochanteric bursitis.  MRI reviewed showing mild tendinopathy.  She is doing physical therapy which is helpful.  We discussed the results of her MRI today as well as treatment plan and options.  Will do a trial run of extracorporeal shockwave today.  She did tolerate the shockwave treatment so we will go ahead and schedule for the next 3 weeks.  Of note no charge for shockwave today.   PDMP not reviewed this encounter. No orders of the defined types were  placed in this encounter.  No orders of the defined types were placed in this encounter.    Discussed warning signs or symptoms. Please see discharge instructions. Patient expresses understanding.   The above documentation has been reviewed and is accurate and complete [name], M.D.

## 2024-01-28 DIAGNOSIS — F332 Major depressive disorder, recurrent severe without psychotic features: Secondary | ICD-10-CM | POA: Diagnosis not present

## 2024-02-03 ENCOUNTER — Ambulatory Visit: Payer: Self-pay | Admitting: Family Medicine

## 2024-02-03 ENCOUNTER — Encounter: Payer: Self-pay | Admitting: Psychiatry

## 2024-02-03 ENCOUNTER — Ambulatory Visit: Admitting: Psychiatry

## 2024-02-03 DIAGNOSIS — M25551 Pain in right hip: Secondary | ICD-10-CM

## 2024-02-03 DIAGNOSIS — G4710 Hypersomnia, unspecified: Secondary | ICD-10-CM

## 2024-02-03 DIAGNOSIS — M7061 Trochanteric bursitis, right hip: Secondary | ICD-10-CM

## 2024-02-03 DIAGNOSIS — F411 Generalized anxiety disorder: Secondary | ICD-10-CM | POA: Diagnosis not present

## 2024-02-03 DIAGNOSIS — G4733 Obstructive sleep apnea (adult) (pediatric): Secondary | ICD-10-CM

## 2024-02-03 DIAGNOSIS — F339 Major depressive disorder, recurrent, unspecified: Secondary | ICD-10-CM

## 2024-02-03 DIAGNOSIS — R52 Pain, unspecified: Secondary | ICD-10-CM

## 2024-02-03 NOTE — Progress Notes (Signed)
   [facility] [address] [place] [address] Phone: [phone]   Extracorporeal Shockwave Therapy Note    Patient is being treated today with ECSWT. Informed consent was obtained and patient tolerated procedure well.   Therapy performed by Artist Joane  Condition treated: Right lateral hip pain Treatment preset used: Tendinitis Energy used: 90 mJ Frequency used: 15 Hz Number of pulses: 2000 Head Size: Medium Treatment #2 of #4  Electronically signed by:  [facility] 12:49 PM 02/03/24

## 2024-02-03 NOTE — Progress Notes (Signed)
 Deborah Soto [id number] 1966/01/01 58 y.o.     Subjective:   Patient ID:  Deborah Soto is a 58 y.o. (DOB September 12, 1965) female.  Chief Complaint:  Chief Complaint  Patient presents with   Follow-up   Depression   Anxiety   Sleeping Problem   Depression        Associated symptoms include fatigue and myalgias.  Associated symptoms include no decreased concentration.   Deborah Soto presents to the office today for follow-up of TRD and anxiety, and insomnia.  seen December 08, 2018.  She was complaining of lithium  tremor and some cognitive problems.  Hydroxyzine  was stopped.  Propranolol  increased to 60 mg twice daily for tremor.  Lithium  level and amitriptyline  levels were requested. Amitriptyline  level to 37, nortriptyline  49 on amitriptyline  250 mg nightly. Lithium  level 0.800 mg daily  Phone call on October 6 to discuss lab results as follows: Note   ----- Message from Deborah Soto., MD sent at 12/15/2018 10:08 AM EDT ----- Lithium  level stable at 0.8 in a good range.  The previous was 0.7.  She is having some tremor issues.   Normal BMP including excellent creatinine and calcium  levels.  Blood sugar is high but she is aware of that problem.   TSH is within normal limits.   Serum amitriptyline  level is 286 which is at the upper end of the normal range and suggestive that we not try further increase.  We will discuss further options at her follow-up appointment.   No medication adjustments required, but because of her tremor and her lithium  level is slightly higher than it was with the prior level if she wants to reduce the lithium  from 3 of the 300 mg tablets daily to 2-1/2 of the 300 mg tablets daily she can do so.  At her last appointment she was also encouraged to try a higher dose of propranolol  and that may have resolved the problem.  If it did do not reduce the lithium  but if it did not and she wants to reduce the dose she can do so as noted.  She should call  us  if she has any recurrence of symptoms.   Deborah Geoffry MD, [name]     She had repeat lithium  level at 750 mg daily of 0.  7 with some improvement in tremor.  In phone call on November 20 she reported mood was worse with weepiness, overreacting, pity party.  Because mood symptoms were worse with the reduction lithium  she was encouraged to increase the dosage back to 900 mg daily.   Last seen February 04, 2019.  The following changes were made: Increase propranolol  to 80 mg twice daily for lithium  tremor and anxiety her blood pressure and pulse appear high enough that she can tolerate this dosage.  Disc Se.  Disc risk with low BP and DM.  He has not been having any problems with low blood sugar. Amitriptyline  6 or 300 mg daily if tolerated.  Try to take some about 4 -5 hours before sleep Try to take lithium  also 4-5 hours before sleep to minimize daytime tremor Add methylphenidate  ER 27 mg in the morning.  If no response we will increase the dosage.  Seen May 08, 2019 with her husband. Aside from physically feeling like she can't do much then having memory issues.  Forgetful.  Loses track of thoughts between tasks.  Most embarrassing being around people and word-finding issues.   Problems with walking bc balance and weakness.  Fears  falling. Only fall at Xmas morning.  Tremor is awful.  Affects keying.   H notices cognitive problems and forgetfulness.    Shaking got really bad and got lithium  level as low as 600 mg but the next day felt really helpless and everything seemed to be aimed at hurting me and felt disrespected.  Problems with boss and 58 yo taking 5 AP classes and 2 volleyball classes.  I've been on her to accomplish all these things.  She claimed that pt screaming at her.   H CO she was more irritable after the reduction in lithium .   H CO her anxiety also seems worse.  H says the last year has been unhealthy with 12 hour work days for her.   Doesn't go out much.    Attention  problems more noticeable.  Still dropping and tremor.  Tolerated the increase in propranolol .    CO shakiness from lithium  which interferes with typing. Also a good bit of anxiety generally without panic.  BS are high.  NotTaking hydroxyzine .   sleeping better and still using CPAP.  Using CPAP regularly. Less awakening than before.  No SE. depression was worse after reducing the lithium .  Irritability was also worse after reducing the lithium  to 600 mg daily.  seen May 08, 2019.  Multiple changes made:  Reduce propranolol  to 60 mg twice daily for lithium  tremor and anxiety since the increase was not helpful.   Restart B6 for lithium  tremor. 500 mg BID. She forgot and doesn't want more. Try to take lithium  also 4-5 hours before sleep to minimize daytime tremor. depression was worse after reducing the lithium .  Irritability was also worse after reducing the lithium  to 600 mg daily. So reluctant to reduce it further.  It is unclear how to address the benefits of lithium  without using lithium  other than to consider Spravato  Increase methylphenidate  ER 36 mg in the morning. Increase methylphenidate  ER 36 mg in the morning.  Wait 1-2 weeks then reduce amitriptyline  to 5 tablets nightly to see if cognition is better. Also saw Dr. [name] re: sleep disorder.  05/2019 appt with the following noted: Lost Rx of Concerta  36 so didn't take it long.  Out of it.  Still shakey and cognitive problems.  Dep 5/10/  Anxiety 6/10 worse at night.  No SI.  8 hours sleep.  Mild panic couple times daily. Frustrated with tremor and sleepiness which is worse than tiredness.   History of low vitamin D  taking 150K/week but stopped 6 mos ago. Plan : Modafinil  100 mg tablet 1 dailly for 1 week, Then add Concerta  36 mg 1 each AM.  Option increase modafinil  mid April  Restart B6 for lithium  tremor. 500 mg BID. Get lab test at earliest convience  10/14/2019 appointment with the following noted: Has been on amitriptyline   250 mg for mos.  Tried a week ago reducing to 200 and felt worse so back on 250. Weaned off Lyrica  about a month ago.  Not much change off it.   Not sure if it helped energy or alertness. Saw Dr. Dyan in consultation. Mental clarity is a lot better with stimulants and not sleeping as much in daytime with current meds.  Severe drowsiness is a lot better.   Off metformin  and added Glipizide.   Not great with depression 5/10.  Anxiety can trigger problems with thinking and somatic sx   I feel like it's something physical and can keep her up at night. Occ racing heart  or feeling hot.  Caffeine variable.  Plan:Plan: Increase methylphenidate  from 36 to 54 mg in the morning. Increase amitriptyline  to 6 tablets in the evening    11/12/19 TC noting call: Pt called stated she's not feeling well at all. Ask for sooner apt than 10/7. Depression, anxiety & focus is much worse. Has not planned to hurt herself but thoughts of things would be better if I was not here have crossed her mind often. Contact ASAP @ [phone].  I put Pt on canc list  MD response: I reviewed the meds and previous med lists.  The next options are either Spravato or a major med change from amitriptyline  which is not something I can do outside of an appt.  She's on cancellation list and we'll try to work her in sooner.  Nothing I can change right now except since the increase in amitriptyline  has not helped, she can reduce it to 200 mg nightly in preparation for other changes to come. Nursing response after talking with pt: Discussed symptoms and medications with patient, she did not increase her Amitriptyline  50 mg up to 6 tablets so she will start that today. Said you may have told her that but she didn't remember. She has read about Spravato but she is not wanting to proceed with that. Informed her she's on cancellation list as well. She will call back with worsening signs or symptoms.  11/23/19 appt with the following noted: Forgot to  increase amitriptyline  until noted. Tolerating it OK but hasn't had time to help. More alert with Concerta  increase to 54 mg daily also with modafinil .  A little more focused and better productivity.  Still forgetful.   No SE noted except GI issues from diarrhea to constipation.   Still miserable and cry easily.  Try to numb herself.  Always tired especially emotionally.  Anxiety is high and predates current stimulants. No therapy since [facility].  She felt he pushed too hard for her to make changes.  I don't feel like I can deal with him right now. Plan: Continue meds & The increase in amitriptyline  to 300 mg daily needs more time.  01/12/2020 appointment with the following noted: Not well.  Mood horrible.  Easily frustrated sometimes without reason and may nap or cry.  Anxiety is pretty bad.  Daytime sleepiness seems worse.  Can get nausea from anxiety.  Feels like function is slow.  Have to write everything down.  Hates home, job, high school volleyball program.  At some point I have to take responsibility.   Amitriptyline  also not helping pain any.  Clenching jaw for 3 weeks. Likcking lips.  Picking fingers. D will play volleyball at [employer]. Plan: Reduce amitriptyline  by 1 tablet every 4 days. Start Viibryd  10 mg daily with food for 7 days, then 20 mg daily for 7 days, then increase to 40 mg daily.  03/18/20 appt urgently made and noted:  Seen with husband. Still with jaw clenching day and night despite off stimulant. Now on Viibryd  60 mg since 03/02/20 and no stimulant., lithium  900 mg, and propranolol  80 BID. Rare hydroxyzine  DT sleepiness, Gabapentin  300 TID. Not taking it with food.  LT sensitivity to noise and light and overstimulated and now bothering her more at work.  Always had to use shirts without tags and sensitivity to smells. Easily agitated in public per H.  Gets negative and wants to give up and not live anymore.  She says there's less chance of acting on  it if  she tells H about it and she's doing so now.  Pain worse off amitriptyline  esp around ribs and general diffuse pain.  Anxiety worse with stimulants and hasn't gone away.  Trouble staying asleep. Plan: Need to take Viibryd  60 with food emphasized.  She's not currently doing so. Restart low-dose amitriptyline  for pain  04/22/2020 appointment with following noted: Only restarted amitriptyline  50 mg last week.  Stays asleep better with it.  Sleep 8-9 hours now but no change in pain yet.  Sleepier in day recently. Done a lot of travel lately. Maybe viibryd  making a little difference with mood and anxiety now that has been on it longer.  Needs to take Viibryd  in AM bc seems to interfere with sleep. Final season of travel volleyball with good team. H notes still some irritability. Pain worse off amitriptyline .  Can reduce to  amitriptyline  25 for pain if 50 mg remains too sedating.  Is sleeping better.      06/21/2020 appointment with following noted:  Seen with H Tolerating amitriptyline  50 mg HS with decent sleep. Really crappy and as bad as 10 years.  Situationally life sucks.  Work environment is bad with coworkers.  Since Sept work is bad.  Doesn't feel people are supportive at home or work. H working 12 hour days.  When she's off wants to sleep all day.   Still in therapy with [facility].  Is good therapist. 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  Check lithium  level Referred to Dr. Vincente for Spravato  11/03/2020 appointment with the following noted: 58 yo D got Covid but is OK. Still doing Spravato 84 mg twice weekly and plans to continue for awhile. And for a total of 3 mos.  Tolerated well.  It's a nice feeling.   Didn't seem to help the depression for a couple of months.   It does feel different. SI has resolved lately which is particularly impressive  given she has a lot of free time. She has lot s of time with nothing to do bc quit her job. Job environment got worse and worse.  Had been there 9 years.   Doing PT for pain in her back.  Ongoing physical issues.   Mood clearly better with Spravato but not resolved.   Neck fusion recently. Plan: check lithium  level Reduce olanzapine  to 5 mg HS  12/30/2020 appointment with the following noted: Surges of anxious feelings in her chest without heart racing several times daily last 15 mins and without pcpt.  Stay a little on edge all the time.   Left job and too much time on her hands. Youngest at [employer]  and [employer] in new position.  She's doing well statistically.  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 Spravato twice weekly.  Is better with it but still some mild cycling of depression.  Still upset over leaving work.  SI very rare and fleeting.  Had a really good plan but doesn't dwell on the plan. Lithium  level 0.7 on 900 mg HS. Reduced olanzapine  to 5 mg HS and has felt a little worse. Viibryd  60 mg daily. Amitrptyline 50 for pain.  Not sure if it helps.  PT helped a lot with tension in neck and down spine. Sleeping pretty well after 8 hours. Plan increase olanzapine  back to 10 mg nightly for treatment resistant depression and anxiety.  Anxiety was worse after reducing it and no symptoms were improved after reducing it. Try to stop amitriptyline  because it is probably not needed in the presence of olanzapine . Check lithium  level  03/14/2021 appointment noted: Increase olanzapine  to 10 mg for anxiety spells that are unexpected and hit her in the chest esp in the morning. Very low right now.  Twice weekly Spravato and felt good for awhile.  Maybe a slump after Xmas.  Not much social interaction since she quit her job.  Xmas gave her focus.  Doesn't have this now.   Mark working 14-16 hours daily.  This won't get better. Kids are doing great but not around Sleep well generally and that is a benefit  of olanzapine . Tolerates it well.   Plan: Trial increase olanzapine  to 15 mg HS  05/03/2021 appointment with the following noted: Still getting Spravato twice weekly.   I love it.  Totally relaxed Depression and anxiety SI is gone or fleeting with Spravato. Still in therapy with [facility] and thinks a lot of stuff go back to childhood.  Was always criticized for normal childhood mistakes.  So coped by overcompensating with going big and grand. Can't get energy up to do anything. Never felt she had a manic periods except overstimulated CC anxious feeling in body tingling and tight uncomfortable feeling.  Reminds her of stimulant effects but less intense and worse in AM. Everything still seems to hard like going to grocery store and worry over messing up cooking. Watches too much TV For a little while increase olanzapine  seemed to help.  Tolerating it. Depression is not that bad compared to the past. Plan: no med changes  07/17/21 appt noted:  seen with H Depression with twice weekly Spravato. But a lot of general anxiety and then spells of getting tingly and nervous.  Not triggers.   Using propranolol  BID. Anxiety is continuation of the way it was before.  Doesn't seem to vary with time of day.  Awakens with anxiety. No SE noted except sleepy.  Sleeping at least 10-11 hours.  Not obviously worse with increase olanzapine .  Viibryd  in AM bc insomnia otherwise. Balance trouble with hunched walk and she feels like legs are waker than they should be. Plan: Stop regular propranolol  and start ER propranolol  ER 1 at night for anxiety Reduce clonidine  to 1/2 tablet twice daily Wait 1 week and reduce olanzapine  to 1/2 tablet nightly to see if energy, alertness and balance are better. Starting June 1 stop clonidine .  09/05/21 appt noted: Still low energy.  No changes with less meds. Anxiety a little better last week for unclear reasons. She feels olanzapine  helps sleep and doesn't want to stop  it.  Sleep a lot.  11-11 if she can. Therapist working with her on childhood trauma. Long period of depression. Plan: Start Auvelity  1 daily for a week then 1 twice daily  11/27/21 appt noted:  Couple of phone calls and tremor problems and needed to reduce lithium  to 1 and 1/2 tablets and it helped reduce the tremor. Maybe a little improvement.  Not so depressed but kind of numb.   Spells of anxiety but a little better.  Can feel it in chest and arms at times every other night.   Continues spravato twice weekly.   Sleep is great. Off clonidne.  Contiues Aubelity twice daily, lithium  675 mg daily, olanzapine  5 mg HS, viibryd  60 mg daily. Sleep from 11-9 and sleeps well. Plan: Trial reduction in olanzapine  to  2.5 mg nightly.  02/14/22 appt noted: Noticed FM got better in summer and then relapsed.  Balance worth with FM also.   Did reduce Spravato from twice weekly to weekly in October.  Did it twice weekly for a year now.  Thinks maybe that is why FM flared.  Continues to weekly. No SI in over a years.  Better dresssed and using makeup now.  Wasn't for about 5 years.. Getting closer to feeling normal .  Depression is still good. Reduced and increased gabapentin  to 900 BID. No other med changes except baclofen  prn which is marginally helpful. Feels better and got motivated to buy a car.  She's more active and less depressed than she has been.  Gets showered and dress daily before leaving [place]. Less numb emotionally than when last here.  Some excitement. Asks about coming off the Auvelity  to reduce meds. A1C is 5.   Sleep is better and using CPAP Plan: no med change.  Continue Auvelity  1 twice daily, continue lithium  CR 450 mg tablets 2 nightly, continue olanzapine  2.5 mg nightly, continue propranolol  ER 160 mg daily, continue Viibryd  60 mg daily.  05/16/22 appt noted: Meds as above.  Note she was prescribed Mounjaro . switched from gabapentin  to Lyrica  150 mg twice daily,  Missed Auvelity  7  days and felt better when restarted it and I think it helps. Spravato twice weekly early Jan hoping to help pain but didn't. Still dealing with chronic pain esp around abd but also general aches.  Lyrica  has helped some No SE Doing well other than pain.  Had a dip in mood late DEC into early Feb without SI but not interested but better now. Interest in cookie decorating and to make for Maggie's wedding in next year. Plan no changes  08/01/22 appt noted: Psych med: Auvelity  BID, lithium  CR 675 mg nightly, olanzapine  2.5 mg nightly, propranolol  ER 160 mg daily, Viibryd  60 mg daily, vitamin D  50000 units weekly.  Also on pregabalin  150 mg every morning and 300 mg nightly for fibromyalgia pain.  Spravato twice weekly. Doing pretty well and better than usual.   Still in therapy with [facility].  Dealt with feelings worthlessness out of childhood.  Parents were very critical.  Has let a lot of this go. This is best I've felt in a long time.   Tolerating meds. New hobby is baking.   Going to see M in [place] in July which could be triggering.  Sister is there too and much like her. Sister can be critical too.  Her kids are going too.   Had a nice mother's Day affirming.  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   Sleep is good. Plan no changes  10/03/22 appt noted: Psych med: Auvelity  BID, lithium  CR 675 mg nightly, olanzapine  2.5 mg nightly, propranolol  ER 160 mg daily, Viibryd  60 mg daily, vitamin D  50000 units weekly.  Also on pregabalin  150 mg every morning and 300 mg nightly for fibromyalgia pain.  Spravato twice weekly. Hesitant to try cutting back to once weekly Spravato bc upcoming family vacation. Doing therapy .  Father is passed.  M can be dismissive and critical.  Wrote a letter letting go of the past.   M on O2.  Family in [place].   Better energy.  Big family gathering this next week.   Dealing with DM unstable craving.   Plan no med changes  01/03/23 appt  noted; No med changes, continue: Auvelity  BID,  lithium  CR 675 mg nightly, olanzapine  2.5 mg nightly, propranolol  ER 160 mg daily, Viibryd  60 mg daily, vitamin D  50000 units weekly.  Also on pregabalin  150 mg every morning and 300 mg nightly for fibromyalgia pain.  Spravato twice weekly. Would like to cut Spravato to once weekly bc has things to do.  Involved in campaigns.  Passionate about it.   Mood is much better.  Doing really well overall and seeing [facility] every other week.   Made big progress and feels like a blanket taken off.  Write a lot of therapeutic letters.  Gotten very busy since then.  Will start baking again and [employer] business.  Enjoying the research.   Involved in early voting outreach.  D's teachers. Couple of times forgetting night time meds.  Poor sleep if forgets night meds.   Having fun in life.    03/25/23 appt noted: No med changes, continue: Auvelity  BID, lithium  CR 675 mg nightly, olanzapine  2.5 mg nightly, propranolol  ER 160 mg daily, Viibryd  60 mg daily, vitamin D  50000 units weekly.  Also on pregabalin  150 mg every morning and 300 mg nightly for fibromyalgia pain.  Spravato twice weekly. Still getting Spravato went to once weekly. Been ok.  Some of the excitmemt has leveled out from last viist. DT holdidays was spread out more than usual.  Don't like the holidays.  Could have been holidays.   Seeing therapist [facility] every other week.  Not really down now.  Even on low days nothing like before.   Wants to wean off lithium  bc no longer suicidal.   Has lots of ideas but would like more energy.     Depression started after children born.      05/23/23 appt noted: Med: Auvelity  BID, reduced and increased lithium  CR 675 mg nightly, olanzapine  2.5 mg nightly, propranolol  ER 160 mg daily, Viibryd  60 mg daily,  vitamin D  50000 units weekly.  Also reducing pregabalin  50 mg every morning and 50 mg nightly for fibromyalgia pain.  Spravato once weekly. No SE  D getting married next  month.   Working on garden.  Started [employer] company to [employer]. Doing some PT work early Jan 8 hours / week which has been good so far.  Busy .   Generally mood wise happy but not sleepy that well.  Wonders about incr olanzapine .  More bad dreams 50% of time and some NM.  Sleep worse in the past month.   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  Had a rage reaction after reducing lithium  to less lithium  and better with incr back to 675 mg . Feel so much better and more outgoing.  Than before.  Pleased with Spravato.  Empty nester's Not having much FM pain.   DM is controlled.  Still trouble with wt.  Not binge eating.   Plan: Done well with reduced olanzapine  to 2.5 mg HS for TRD until lately worsening sleep, temporarily increase for sleep to 5 mg HS  08/01/23 appt noted: Med: Auvelity  BID, reduced back lithium  CR 450 mg nightly, olanzapine  5 mg nightly, propranolol  ER 160 mg daily, Viibryd  60 mg daily,  vitamin D  50000 units weekly.  Stopped Lyrica  for fibromyalgia pain and it's worse.  Spravato once weekly.  On Mounjaro .   No SE D wedding went well.   Ok other than pain worse.  Maybe worse from stopping Lyrica .  Stopped it bc didn't think it was helping but pain is worse. Lost to 200# and now 210#.   Still  have a sweet tooth.  Doing Hung[employer] root.   Sleep is better with olanzapine . But brief awakening.   Fair bit of anxiety which will hit in chest.   Some chronic tiredness.   Doing meditation for women going through menopause to lower stress hormones.   Plan: continue: Auvelity  BID,  Continue lithium  450 mg HS  olanzapine  5 mg nightly,  Reduce propranolol  ER 120 mg daily to help energy and bc no tremor.  Viibryd  60 mg daily, vitamin D  50000 units weekly.    Spravato twice weekly.  10/03/23 appt noted:  Med: Auvelity  BID, reduced back lithium  CR 450 mg nightly, olanzapine  5 mg nightly, propranolol  ER red from 160 mg to 80 mg daily, Viibryd  60 mg daily,  vitamin D  50000 units weekly.  Stopped Lyrica  for fibromyalgia  pain and it's worse.  Spravato once weekly.  On Mounjaro .   No SE No tremor recurred and no difference with less propranolol .   Gardening and baking.  Sometimes not the energy to get up and do it.   No longer suicidal.  Was more active than she was.   Some anxiety in phases. Chest anxiety not worse.   Not sig dep.  When severely dep watched a lot of animal shows.   Hip pain may interfere with sleep.    02/03/24 appt noted:  Med: Med: Auvelity  BID, reduced back lithium  CR 450 mg nightly, olanzapine  5 mg nightly, propranolol  ER 80 mg daily, Viibryd  60 mg daily,  vitamin D  50000 units weekly.  Stopped Lyrica  for fibromyalgia pain and it's worse.  Spravato once weekly.  On Mounjaro .   No SE.  No sig tremro. Needs new CPAP.  Questions about how to get it.  Doing really well.  Not dep and no tanxious.   Don't have a ton of energy.  Working PT.  Ok.  Helping a friend. When home off work days, not a lot of energy.   Son engaged and wedding in the fall. Not sig irritable.   D engaged and will  move to [facility] likely.   Feels like she takes a lot of meds.   Started app Hypnovio for wt loss.  Lost 10-15#.    Sleep well.  ECT-MADRS    Flowsheet Row Office Visit from 05/03/2021 in [facility]  MADRS Total Score 32   GAD-7    Flowsheet Row Office Visit from 02/13/2023 in [facility]  Total GAD-7 Score 7   PHQ2-9    Flowsheet Row Office Visit from 08/16/2023 in [facility] [place] Office Visit from 05/15/2023 in [facility] from 04/16/2023 in [facility] from 02[facility]13/2023 in [facility] from 10/31/2022 in [facility] [place]  PHQ-2 Total Score 0 0 0 1 0  PHQ-9 Total Score 0 0 0 3 0   Flowsheet Row Admission (Discharged) from  08/17/2020 in [facility] Pre-Admission Testing 60 from 08/16/2020 in [facility] PREADMISSION TESTING  C-SSRS RISK CATEGORY No Risk No Risk    Maggie 34 and dating.  Past Psychiatric Medication Trials:  Failed ECT and TMS,  Spravato started 07/2020 amitriptyline  helped for 2 to 3 years in 1987,   2021 No benefit from amitriptyline  300 mg with adequate duration.  Nortriptyline  200 with a therapeutic blood level. Trintellix, duloxetine  120, Lexapro  20, Fetzima,  Wellbutrin, fluoxetine, paroxetine, sertraline,  (Poor response to SSRIs) Viibryd  60 [place],   lithium  SE,  Olanzapine  2.5 ins, 5 mg sleep better Latuda 120, Abilify 15,  Vraylar, Rexulti ,Seroquel 400, risperidone, Geodon,  Haldol for agitation, lamotrigine 300,  buspirone , clonazepam, gabapentin , Propranolol , clonidine  0.1 mg BID modafinil ,  Nuvigil , Vyvanse, Concerta  Pramipexole SE compulsive  SHE DOESN'T WANT STIMULANTS AGAIN bc of anxiety. Trazodone ,   GENESIGHT COMPLETED  history of overdose on Xanax, There is a history of suicide attempts and psychiatric hospitalizations.  New PCP good [place] [facility]   Review of Systems:  Review of Systems  Constitutional:  Positive for fatigue. unchanged Cardiovascular:  Negative for chest pain and palpitations.  Gastrointestinal:  Negative for diarrhea.  Musculoskeletal:  Positive for back pain and myalgias. Negative for gait problem.       Less pain lately.  Neurological:  Positive for mild tremors .  Negative for dizziness.  Psychiatric/Behavioral:  Positive for dysphoric mood. Negative for confusion, decreased concentration, hallucinations and self-injury. The patient is nervous/anxious.     Medications: I have reviewed the patient's current medications.  Current Outpatient Medications  Medication Sig Dispense Refill   atorvastatin  (LIPITOR) 10 MG tablet Take 1 tablet (10 mg total) by mouth daily.     AUVELITY  45-105 MG  TBCR TAKE ONE (1) TABLET BY MOUTH TWICE A DAY 180 tablet 0   celecoxib  (CELEBREX ) 200 MG capsule Take 1 capsule (200 mg total) by mouth 2 (two) times daily. 60 capsule 0   Continuous Blood Gluc Receiver (DEXCOM G7 RECEIVER) DEVI Use 4 times daily to check blood sugar 1 each 11   Continuous Glucose Sensor (DEXCOM G7 SENSOR) MISC USE AS DIRECTED FOUR TIMES DAILY TO CHECK BLOOD SUGAR 3 each 2   Empagliflozin -metFORMIN  HCl ER (SYNJARDY  XR) 25-1000 MG TB24 Take 1 tablet by mouth daily.     Esketamine HCl, 84 MG Dose, (SPRAVATO, 84 MG DOSE,) 28 MG/DEVICE SOPK Place 84 mg into the nose See admin instructions. Use weekly     Ginger, Zingiber officinalis, (GINGER PO) Take by mouth.     Insulin  Pen Needle (PEN NEEDLES) 33G X 4 MM MISC Use daily as needed to inject insulin  100 each 1   ipratropium (ATROVENT ) 0.03 % nasal spray SMARTSIG:2 Spray(s) Both Nares 3 Times Daily PRN     lidocaine  (LIDODERM ) 5 % Place 1 patch onto the skin every 12 (twelve) hours. Remove & Discard patch within 12 hours or as directed by MD 30 patch 2   lithium  carbonate (ESKALITH ) 450 MG ER tablet Take 1 tablet (450 mg total) by mouth daily in the afternoon. 90 tablet 1   MOUNJARO  15 MG/0.5ML Pen INJECT 0.5MLS SUBCUTANEOUSLY ONCE A WEEK 6 mL 11   OLANZapine  (ZYPREXA ) 5 MG tablet Take 1 tablet (5 mg total) by mouth at bedtime. 90 tablet 1   pantoprazole  (PROTONIX ) 40 MG tablet TAKE ONE TABLET BY MOUTH ONCE DAILY FOR STOMACH PROTECTION 30 tablet 0   propranolol  ER (INDERAL  LA) 60 MG 24 hr capsule Take 1 capsule (60 mg total) by mouth daily. 30 capsule 2   SYNJARDY  XR 12.07-998 MG TB24 TAKE TWO TABLETS BY MOUTH DAILY 60 tablet 2   tretinoin (RETIN-A) 0.05 % cream SMARTSIG:Sparingly Topical Every Night     tretinoin (RETIN-A) 0.1 % cream nightly.     trimethoprim -polymyxin b  (POLYTRIM ) ophthalmic solution Place 2 drops into both eyes every 6 (six) hours. 10  mL 0   TURMERIC PO Take by mouth.     Vilazodone  HCl (VIIBRYD ) 40 MG TABS Take  1.5 tablets (60 mg total) by mouth daily. 135 tablet 1   Vitamin D , Ergocalciferol , (DRISDOL ) 1.25 MG (50000 UNIT) CAPS capsule TAKE ONE CAPSULE BY MOUTH EVERY 7 DAYS 15 capsule 1   XARELTO  20 MG TABS tablet TAKE ONE TABLET BY MOUTH DAILY WITH SUPPER 30 tablet 2   No current facility-administered medications for this visit.    Medication Side Effects: Sedation  Allergies:  Allergies  Allergen Reactions   Tramadol  Other (See Comments)    Tingling all over    Hydrocodone      Ineffective     Past Medical History:  Diagnosis Date   Anemia    Anxiety    Asthma    Depression    Diabetes mellitus type 2 in obese 06/11/2016   Dyspnea    Embolism - blood clot January 1997 & January 2017   Also had LLE DVT in 1997   History of kidney stones    Hypertension    Preeclampsia 10/10/2011   1999    Sleep apnea     Family History  Problem Relation Age of Onset   Dementia Father    Heart disease Father    Clotting disorder Mother    Arthritis Mother    Clotting disorder Sister    Clotting disorder Maternal Grandmother    Clotting disorder Maternal Aunt    Rheumatologic disease Neg Hx    Hyperparathyroidism Neg Hx     Social History   Socioeconomic History   Marital status: Married    Spouse name: Not on file   Number of children: Y   Years of education: Not on file   Highest education level: Not on file  Occupational History   Occupation: admin assist    Comment: insurance verification  Tobacco Use   Smoking status: Former    Current packs/day: 0.00    Average packs/day: 1 pack/day for 20.0 years (20.0 ttl pk-yrs)    Types: Cigarettes    Start date: 03/12/1984    Quit date: 03/12/2004    Years since quitting: 19.9    Passive exposure: Past   Smokeless tobacco: Never  Vaping Use   Vaping status: Never Used  Substance and Sexual Activity   Alcohol use: Yes    Alcohol/week: 1.0 standard drink of alcohol    Types: 1 Cans of beer per week    Comment: 1-2 times a week    Drug use: No   Sexual activity: Yes    Partners: Male    Birth control/protection: None  Other Topics Concern   Not on file  Social History Narrative   Originally from [place]. Always lived in [place] . 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 No international travel recently. Previously has been to [place] in May 1996. No mold exposure recently. No bird exposure. Does have multiple pets.    Social Drivers of Corporate Investment Banker Strain: Not on file  Food Insecurity: Not on file  Transportation Needs: Not on file  Physical Activity: Not on file  Stress: Not on file  Social Connections: Unknown (07/24/2021)   [facility]   Social Network    Social Network: Not on file  Intimate Partner Violence: Unknown (06/15/2021)   Received from [facility]   HITS    Physically Hurt: Not on file    Insult or Talk Down To: Not on file  Threaten Physical Harm: Not on file    Scream or Curse: Not on file    Past Medical History, Surgical history, Social history, and Family history were reviewed and updated as appropriate.   Please see review of systems for further details on the patient's review from today.   Objective:   Physical Exam:  LMP  (LMP Unknown) Comment: last period ---  1 year ago (?)  Physical Exam Constitutional:      General: She is not in acute distress. Musculoskeletal:        General: No deformity.  Neurological:     Mental Status: She is alert and oriented to person, place, and time.     Cranial Nerves: No dysarthria.     Coordination: Coordination normal.  Psychiatric:        Attention and Perception: Attention and perception normal. She does not perceive auditory or visual hallucinations.        Mood and Affect: Mood is euthymic. Affect is not labile, blunt, angry, tearful or inappropriate.    Not as severe as in the past. Not irritable.    Speech: Speech normal.        Behavior: Behavior normal. Behavior is cooperative.        Thought  Content: Thought content normal. Thought content is not paranoid or delusional. Thought content does not include homicidal or suicidal ideation. Thought content does not include suicidal plan.        Cognition and Memory: Cognition and memory normal.        Judgment: Judgment normal.     Comments: Insight intact    Lab Review:     Component Value Date/Time   NA 140 08/16/2023 1007   NA 138 08/21/2019 0000   NA 140 12/11/2016 1236   K 3.9 08/16/2023 1007   K 4.2 12/11/2016 1236   CL 106 08/16/2023 1007   CO2 27 08/16/2023 1007   CO2 24 12/11/2016 1236   GLUCOSE 137 (H) 08/16/2023 1007   GLUCOSE 109 12/11/2016 1236   BUN 20 08/16/2023 1007   BUN 12 08/21/2019 0000   BUN 15.1 12/11/2016 1236   CREATININE 0.66 08/16/2023 1007   CREATININE 0.68 02/22/2020 1649   CREATININE 0.8 12/11/2016 1236   CALCIUM  9.8 08/16/2023 1007   CALCIUM  10.0 12/11/2016 1236   PROT 6.8 08/16/2023 1007   PROT 7.5 12/11/2016 1236   ALBUMIN 4.6 08/16/2023 1007   ALBUMIN 4.4 12/11/2016 1236   AST 17 08/16/2023 1007   AST 38 (H) 12/11/2016 1236   ALT 19 08/16/2023 1007   ALT 61 (H) 12/11/2016 1236   ALKPHOS 43 08/16/2023 1007   ALKPHOS 71 12/11/2016 1236   BILITOT 0.5 08/16/2023 1007   BILITOT 0.62 12/11/2016 1236   GFRNONAA >60 08/16/2020 1157   GFRNONAA 105 12/11/2018 1121   GFRAA 122 12/11/2018 1121       Component Value Date/Time   WBC 7.5 08/16/2023 1007   RBC 4.91 08/16/2023 1007   HGB 14.3 08/16/2023 1007   HGB 14.5 12/11/2016 1236   HCT 43.2 08/16/2023 1007   HCT 43.9 12/11/2016 1236   PLT 235.0 08/16/2023 1007   PLT 289 12/11/2016 1236   PLT 295 07/24/2016 1848   MCV 88.0 08/16/2023 1007   MCV 93.2 12/11/2016 1236   MCH 29.9 08/16/2020 1157   MCHC 33.2 08/16/2023 1007   RDW 15.2 08/16/2023 1007   RDW 13.5 12/11/2016 1236   LYMPHSABS 2.2 11/22/2020 1223   LYMPHSABS 2.4 12/11/2016 1236  MONOABS 0.6 11/22/2020 1223   MONOABS 0.7 12/11/2016 1236   EOSABS 0.1 11/22/2020 1223    EOSABS 0.7 (H) 12/11/2016 1236   BASOSABS 0.0 11/22/2020 1223   BASOSABS 0.0 12/11/2016 1236    Lithium  Lvl  Date Value Ref Range Status  11/22/2020 0.7 0.6 - 1.2 mmol/L Final   11/22/20  lithium  0.7 on 900   Serum nortriptyline  level on 150 mg a day was 109  Amitriptyline   Level 12/11/2018 =237  On 250 mg daily,.  12/23/2020 vitamin D  level 31.38, vitamin B12 519  No results found for: PHENYTOIN, PHENOBARB, VALPROATE, CBMZ   .res Assessment: Plan:    Recurrent major depression resistant to treatment  Generalized anxiety disorder  Obstructive sleep apnea  Hypersomnolence  Diffuse pain   Judie has chronic severe major depression and generalized anxiety that is treatment resistant and failed multiple medications ECT and TMS as noted above including SSRIs, try cyclic's, lithium , and atypical antipsychotics for depression.. She has had partial benefit from amitriptyline  plus lithium .  However  tremor problems from the lithium .  Efforts to reduce the lithium  have led to worsening psychiatric symptoms.  There has been a discussion about possible bipolar elements but she has no clear manic episodes unrelated to medication changes.  She has features of atypical depression.  So an MAO inhibitor is attractive from that perspective. Not sig depressed and numbness better.  Good response.  Doing well then.   Greatly relieved from the years of waking angry at being alive.  No longer feels that way.  She is continuing Spravato.  Had Done well with reduced olanzapine  to 2.5 mg HS for TRD until worsening sleep, and needed increase for sleep to 5 mg HS For anxiety and depression.  Consider switch and retry pramipexole and push dose. Less numb  Statistically speaking the most potentially beneficial alternative option would be Parnate or Nardil but they are difficult to use   Need to take Viibryd  60 with food emphasized.   Worse impulisivity with reduced lithium  at 300.   Had a rage  reaction after reducing lithium  to less lithium  and better with incr back to  450 MG.  No SE difference.  And doing well. Disc lithium  protective effect on brain health.   No tremor px.    continue Auvelity  1 twice daily BC partial benefit Disc SE.   Her improvement seems to correlate with starting this.  continu counseling.  Good benefit seeing Medford Conner.. Counseling 20 min: Great progress dealing with childhood issues.  Helped current perspective.  Read letter in office written to parents, letting go and forgiveness themes. Encourage physical activity and weight loss.  She is not particularly motivated and feels that is hard to accomplish because of chronic pain.  Chronic pain complicates her treatment of depression.   Lyrica  helps pain.  Recent B12 deficiency dx and treatment started. vitamin D  bc level 31 on 2000U daily  Take vitamin D  regularly.  continue: Auvelity  BID,  Continue lithium  450 mg HS.  Want to try wean after holidays  olanzapine  5 mg nightly,  Wean off propranolol  ER over 4-6 weeks and bc no tremor.  And hopefully improve energy  Viibryd  60 mg daily, vitamin D  50000 units weekly.    Spravato twice weekly.  RX CPAP and supplies.  Follow-up 3-4 mos  Deborah Macintosh MD, [name]  Future Appointments  Date Time Provider Department Center  02/03/2024 12:45 PM Joane Artist RAMAN, MD [facility] None  02/11/2024 10:45 AM Corey, Evan  S, MD [facility] None  02/17/2024  9:15 AM Joane Artist RAMAN, MD [facility] None  02/21/2024  9:20 AM Kennyth Worth HERO, MD [facility]     No orders of the defined types were placed in this encounter.      -------------------------------

## 2024-02-04 DIAGNOSIS — F332 Major depressive disorder, recurrent severe without psychotic features: Secondary | ICD-10-CM | POA: Diagnosis not present

## 2024-02-09 ENCOUNTER — Other Ambulatory Visit: Payer: Self-pay | Admitting: Family Medicine

## 2024-02-09 ENCOUNTER — Other Ambulatory Visit: Payer: Self-pay | Admitting: Psychiatry

## 2024-02-09 DIAGNOSIS — F411 Generalized anxiety disorder: Secondary | ICD-10-CM

## 2024-02-09 NOTE — Telephone Encounter (Signed)
 Wean off propranolol  ER over 4-6 weeks and bc no tremor. And hopefully improve energy   Is she still taking?

## 2024-02-10 NOTE — Telephone Encounter (Signed)
 Pt said it was decided to make any changes right now and she is still taking the propranolol .

## 2024-02-11 ENCOUNTER — Ambulatory Visit: Payer: Self-pay | Admitting: Family Medicine

## 2024-02-11 DIAGNOSIS — F332 Major depressive disorder, recurrent severe without psychotic features: Secondary | ICD-10-CM | POA: Diagnosis not present

## 2024-02-11 DIAGNOSIS — M7061 Trochanteric bursitis, right hip: Secondary | ICD-10-CM

## 2024-02-11 NOTE — Patient Instructions (Signed)
 Thank you for coming in today.

## 2024-02-11 NOTE — Progress Notes (Signed)
   [facility] [address] [place] [address] Phone: [phone]   Extracorporeal Shockwave Therapy Note    Patient is being treated today with ECSWT. Informed consent was obtained and patient tolerated procedure well.   Therapy performed by Artist Joane  Condition treated: Right lateral hip Treatment preset used: Tendinitis Energy used: 90 mJ Frequency used: 15 Hz Number of pulses: 2000 Head Size: Medium Treatment #3 of #4  Electronically signed by:  [facility] 10:49 AM 02/11/24

## 2024-02-17 ENCOUNTER — Other Ambulatory Visit: Payer: Self-pay | Admitting: Psychiatry

## 2024-02-17 ENCOUNTER — Ambulatory Visit: Payer: Self-pay | Admitting: Family Medicine

## 2024-02-17 DIAGNOSIS — F339 Major depressive disorder, recurrent, unspecified: Secondary | ICD-10-CM

## 2024-02-17 DIAGNOSIS — M7061 Trochanteric bursitis, right hip: Secondary | ICD-10-CM

## 2024-02-18 ENCOUNTER — Other Ambulatory Visit: Payer: Self-pay | Admitting: Family Medicine

## 2024-02-18 DIAGNOSIS — F332 Major depressive disorder, recurrent severe without psychotic features: Secondary | ICD-10-CM | POA: Diagnosis not present

## 2024-02-18 NOTE — Progress Notes (Signed)
   [facility] [address] [place] [address] Phone: [phone]   Extracorporeal Shockwave Therapy Note    Patient is being treated today with ECSWT. Informed consent was obtained and patient tolerated procedure well.   Therapy performed by Artist Joane  Condition treated: Right hip greater trochanteric bursitis Treatment preset used: Tendinitis Energy used: 90 mJ Frequency used: 15 hz Number of pulses: 2000 Head Size: Medium Treatment #4 of #4  Electronically signed by:  [facility] 7:02 AM 02/18/24

## 2024-02-21 ENCOUNTER — Encounter: Payer: Self-pay | Admitting: Family Medicine

## 2024-02-21 ENCOUNTER — Ambulatory Visit: Admitting: Family Medicine

## 2024-02-21 VITALS — BP 120/80 | HR 80 | Temp 97.2°F | Ht 68.0 in | Wt 207.6 lb

## 2024-02-21 DIAGNOSIS — F332 Major depressive disorder, recurrent severe without psychotic features: Secondary | ICD-10-CM

## 2024-02-21 DIAGNOSIS — Z23 Encounter for immunization: Secondary | ICD-10-CM

## 2024-02-21 DIAGNOSIS — E1165 Type 2 diabetes mellitus with hyperglycemia: Secondary | ICD-10-CM

## 2024-02-21 DIAGNOSIS — Z7985 Long-term (current) use of injectable non-insulin antidiabetic drugs: Secondary | ICD-10-CM | POA: Diagnosis not present

## 2024-02-21 DIAGNOSIS — R739 Hyperglycemia, unspecified: Secondary | ICD-10-CM

## 2024-02-21 LAB — POCT GLYCOSYLATED HEMOGLOBIN (HGB A1C): Hemoglobin A1C: 5.8 % — AB (ref 4.0–5.6)

## 2024-02-21 MED ORDER — OZEMPIC (2 MG/DOSE) 8 MG/3ML ~~LOC~~ SOPN: 2.0000 mg | PEN_INJECTOR | SUBCUTANEOUS | 3 refills | Status: DC

## 2024-02-21 MED ORDER — OZEMPIC (2 MG/DOSE) 8 MG/3ML ~~LOC~~ SOPN: 2.0000 mg | PEN_INJECTOR | SUBCUTANEOUS | 3 refills | Status: AC

## 2024-02-21 NOTE — Patient Instructions (Signed)
 It was very nice to see you today!  VISIT SUMMARY: Today, you had a follow-up visit to discuss your type 2 diabetes, fibromyalgia, hip pain, and overall health. Your diabetes is well-controlled, and your mood has significantly improved. We also addressed your hip pain and discussed exercise options to improve your physical health. You received your pneumonia and flu vaccines as well.  YOUR PLAN: TYPE 2 DIABETES MELLITUS: Your diabetes is well-controlled with an A1c of 5.8 and an average glucose of 120 mg/dL. You have reached a weight plateau at 205 lbs on Mounjaro . -You have switched to Ozempic  to explore different receptor action. We will monitor your blood glucose levels during the transition. -I will send you a follow-up message in a few weeks to assess the effectiveness of Ozempic .  MORBID OBESITY: Your weight remains at 205 lbs, and you have limited activity due to hip pain. -Continue using the Hypnosio app for relaxation and healthy eating habits. -Consider starting yoga or Pilates with your husband as beneficial low-impact exercises. -Aquatic exercises are also recommended to reduce joint strain.  MAJOR DEPRESSIVE DISORDER, RECURRENT, SEVERE, WITHOUT PSYCHOSIS: Your mood is well-managed with therapy and self-reflection, and there is no current suicidal ideation. -Continue with your current therapy and self-reflection exercises.  CHRONIC HIP PAIN: Your hip pain is improving with sound wave therapy, allowing better daily activity performance. -Continue sound wave therapy as needed. -Consider yoga or Pilates for joint health and pain management.  GENERAL HEALTH MAINTENANCE: You were due for pneumonia and flu vaccinations. -You received your pneumonia and flu vaccines today. -We discussed the importance of these vaccinations and addressed previous misinformation.  Return in about 6 months (around 08/21/2024) for Follow Up.   Take care, Dr Kennyth  PLEASE NOTE:  If you had any lab  tests, please let us  know if you have not heard back within a few days. You may see your results on mychart before we have a chance to review them but we will give you a call once they are reviewed by us .   If we ordered any referrals today, please let us  know if you have not heard from their office within the next week.   If you had any urgent prescriptions sent in today, please check with the pharmacy within an hour of our visit to make sure the prescription was transmitted appropriately.   Please try these tips to maintain a healthy lifestyle:  Eat at least 3 REAL meals and 1-2 snacks per day.  Aim for no more than 5 hours between eating.  If you eat breakfast, please do so within one hour of getting up.   Each meal should contain half fruits/vegetables, one quarter protein, and one quarter carbs (no bigger than a computer mouse)  Cut down on sweet beverages. This includes juice, soda, and sweet tea.   Drink at least 1 glass of water with each meal and aim for at least 8 glasses per day  Exercise at least 150 minutes every week.

## 2024-02-21 NOTE — Progress Notes (Signed)
 Deborah Soto is a 58 y.o. female who presents today for an office visit.  Assessment/Plan:   Assessment & Plan Chronic hip pain   Pain is improving with sound wave therapy, allowing better daily activity performance. Maintenance treatments were discussed. She will continue sound wave therapy as needed and consider yoga or Pilates for joint health and pain management.  General health maintenance   Pneumonia vaccine given today.  She will get flu vaccine at some point later this season.   Chronic Problems Addressed Today: Type 2 diabetes mellitus with hyperglycemia (HCC) A1c very well-controlled at 5.8.  She is working on dietary modifications.  We did discuss incorporating more exercise into her regimen as well.  She is currently looking into starting a yoga program.  She is hoping to lose more weight than where she currently is though she is down about 5 pounds since June.  She would like to try switching her Mounjaro  to Ozempic  to see if this will help her more with weight loss.  Discussed with patient not sure if this will create a significant difference however we will try switching her Mounjaro  to Ozempic  2 mg weekly.  She will continue her Synjardy  12.07-998 twice daily.  She will follow-up with us  in a few weeks via MyChart to let us  know how she is doing with transition to Ozempic .  Recheck A1c in 3 to 6 months.  MDD (major depressive disorder), recurrent severe, without psychosis (HCC) Mood very well-controlled today.  She is working with therapist and psychiatrist.  Morbid obesity (HCC) Her weight has plateaued though she is down about 5 pounds since earlier this year.  Exercise is difficult for her due to her orthopedic issues however we did discuss low impact options and she is looking into starting a yoga program.  We are switching her Mounjaro  to Ozempic  as above to see if this will help her more with her weight loss.  She will follow-up with us  in few weeks via MyChart.   Recheck in office in 3 to 6 months.     Subjective:  HPI:  See assessment / plan for status of chronic conditions.   Discussed the use of AI scribe software for clinical note transcription with the patient, who gave verbal consent to proceed.  History of Present Illness Deborah Soto is a 58 year old female with type 2 diabetes and fibromyalgia who presents for a follow-up visit.  She has persistent hip pain that has improved with four treatments of sound therapy. The pain is less severe and no longer hinders her daily activities, although discomfort persists when standing up or getting out of the car. The treatment is not covered by insurance but is affordable.  Her most recent A1c is 5.8. She uses Mounjaro  for diabetes management and has reached a weight plateau at 205 pounds despite dietary efforts. Her blood sugar, monitored with a Dexcom sensor, averages around 120 mg/dL. She is considering discontinuing the sensor as she feels confident in recognizing her body's signals.  Her depression has significantly improved due to medication and therapy, which have helped her address personal issues. She reports that she used to wake up feeling angry and hopeless, but now feels motivated and has things to look forward to.  Due to hip pain, she has not been engaging in much physical activity, which makes walking difficult. She is considering starting yoga or Pilates with her husband to improve her physical health. She has also been using an app called Hypnosio  to aid in relaxation and healthy eating habits.         Objective:  Physical Exam: BP 120/80   Pulse 80   Temp (!) 97.2 F (36.2 C) (Temporal)   Ht 5' 8 (1.727 m)   Wt 207 lb 9.6 oz (94.2 kg)   LMP  (LMP Unknown) Comment: last period ---  1 year ago (?)  SpO2 99%   BMI 31.57 kg/m   Wt Readings from Last 3 Encounters:  02/21/24 207 lb 9.6 oz (94.2 kg)  01/27/24 209 lb (94.8 kg)  11/18/23 208 lb (94.3 kg)    Gen: No  acute distress, resting comfortably CV: Regular rate and rhythm with no murmurs appreciated Pulm: Normal work of breathing, clear to auscultation bilaterally with no crackles, wheezes, or rhonchi Neuro: Grossly normal, moves all extremities Psych: Normal affect and thought content      Deborah Soto M. Kennyth, MD 02/21/2024 10:15 AM

## 2024-02-21 NOTE — Assessment & Plan Note (Signed)
 A1c very well-controlled at 5.8.  She is working on dietary modifications.  We did discuss incorporating more exercise into her regimen as well.  She is currently looking into starting a yoga program.  She is hoping to lose more weight than where she currently is though she is down about 5 pounds since June.  She would like to try switching her Mounjaro  to Ozempic  to see if this will help her more with weight loss.  Discussed with patient not sure if this will create a significant difference however we will try switching her Mounjaro  to Ozempic  2 mg weekly.  She will continue her Synjardy  12.07-998 twice daily.  She will follow-up with us  in a few weeks via MyChart to let us  know how she is doing with transition to Ozempic .  Recheck A1c in 3 to 6 months.

## 2024-02-21 NOTE — Addendum Note (Signed)
 Addended by: IDA ELORA HERO on: 02/21/2024 02:35 PM   Modules accepted: Orders

## 2024-02-21 NOTE — Assessment & Plan Note (Signed)
 Her weight has plateaued though she is down about 5 pounds since earlier this year.  Exercise is difficult for her due to her orthopedic issues however we did discuss low impact options and she is looking into starting a yoga program.  We are switching her Mounjaro  to Ozempic  as above to see if this will help her more with her weight loss.  She will follow-up with us  in few weeks via MyChart.  Recheck in office in 3 to 6 months.

## 2024-02-21 NOTE — Assessment & Plan Note (Signed)
 Mood very well-controlled today.  She is working with therapist and psychiatrist.

## 2024-02-25 DIAGNOSIS — F332 Major depressive disorder, recurrent severe without psychotic features: Secondary | ICD-10-CM | POA: Diagnosis not present

## 2024-03-10 ENCOUNTER — Other Ambulatory Visit: Payer: Self-pay | Admitting: Family Medicine

## 2024-03-19 LAB — OPHTHALMOLOGY REPORT-SCANNED

## 2024-03-20 ENCOUNTER — Telehealth: Payer: Self-pay | Admitting: Psychiatry

## 2024-03-20 NOTE — Telephone Encounter (Signed)
 Noted thanks

## 2024-03-20 NOTE — Telephone Encounter (Signed)
 Pt called at 4:30 and moved appt up to 2/2

## 2024-03-20 NOTE — Telephone Encounter (Signed)
 Kai at [facility] ([facility]) provider called at 3:25p asking if the pt has been seen in the last 6 months.  I advised yes.  She asked if the notes stated she was benefit and compliant.  I didn't see anything stating that.  They said in order for insurance to pay for a new machine, she has to have had a visit in the last 6 months, that shows she is benefit and compliant.  Dewayne will call the pt and advise her they are cancelling her order until they can get that information.  Next appt 3/25

## 2024-04-09 ENCOUNTER — Other Ambulatory Visit: Payer: Self-pay | Admitting: Psychiatry

## 2024-04-09 DIAGNOSIS — F411 Generalized anxiety disorder: Secondary | ICD-10-CM

## 2024-04-09 DIAGNOSIS — F339 Major depressive disorder, recurrent, unspecified: Secondary | ICD-10-CM

## 2024-04-13 ENCOUNTER — Ambulatory Visit: Admitting: Psychiatry

## 2024-04-13 ENCOUNTER — Encounter: Payer: Self-pay | Admitting: Psychiatry

## 2024-04-13 DIAGNOSIS — F339 Major depressive disorder, recurrent, unspecified: Secondary | ICD-10-CM

## 2024-04-13 DIAGNOSIS — G4733 Obstructive sleep apnea (adult) (pediatric): Secondary | ICD-10-CM

## 2024-04-13 DIAGNOSIS — G4710 Hypersomnia, unspecified: Secondary | ICD-10-CM

## 2024-04-13 DIAGNOSIS — Z79899 Other long term (current) drug therapy: Secondary | ICD-10-CM

## 2024-04-13 DIAGNOSIS — F411 Generalized anxiety disorder: Secondary | ICD-10-CM

## 2024-04-13 MED ORDER — AUVELITY 45-105 MG PO TBCR: 1.0000 | EXTENDED_RELEASE_TABLET | Freq: Two times a day (BID) | ORAL | 1 refills | Status: AC

## 2024-04-13 MED ORDER — PROPRANOLOL HCL ER 60 MG PO CP24: 60.0000 mg | ORAL_CAPSULE | Freq: Every day | ORAL | 0 refills | Status: AC

## 2024-04-13 MED ORDER — LITHIUM CARBONATE ER 450 MG PO TBCR: 450.0000 mg | EXTENDED_RELEASE_TABLET | Freq: Every day | ORAL | 0 refills | Status: AC

## 2024-04-13 MED ORDER — VILAZODONE HCL 40 MG PO TABS: 60.0000 mg | ORAL_TABLET | Freq: Every day | ORAL | 1 refills | Status: AC

## 2024-06-03 ENCOUNTER — Ambulatory Visit: Admitting: Psychiatry

## 2024-08-12 ENCOUNTER — Ambulatory Visit: Admitting: Psychiatry

## 2024-08-21 ENCOUNTER — Ambulatory Visit: Admitting: Family Medicine
